# Patient Record
Sex: Female | Born: 1950 | Race: White | Hispanic: No | Marital: Single
Health system: Southern US, Community
[De-identification: ages and names within clinical notes are randomized; demographics above are authoritative.]

## PROBLEM LIST (undated history)

## (undated) ENCOUNTER — Telehealth

## (undated) ENCOUNTER — Encounter

## (undated) DIAGNOSIS — I509 Heart failure, unspecified: Secondary | ICD-10-CM

## (undated) DIAGNOSIS — I701 Atherosclerosis of renal artery: Secondary | ICD-10-CM

## (undated) DIAGNOSIS — IMO0001 Reserved for inherently not codable concepts without codable children: Secondary | ICD-10-CM

## (undated) DIAGNOSIS — F17200 Nicotine dependence, unspecified, uncomplicated: Secondary | ICD-10-CM

## (undated) DIAGNOSIS — F329 Major depressive disorder, single episode, unspecified: Secondary | ICD-10-CM

## (undated) DIAGNOSIS — R0600 Dyspnea, unspecified: Secondary | ICD-10-CM

## (undated) DIAGNOSIS — N289 Disorder of kidney and ureter, unspecified: Secondary | ICD-10-CM

## (undated) DIAGNOSIS — E7800 Pure hypercholesterolemia, unspecified: Secondary | ICD-10-CM

## (undated) DIAGNOSIS — M2550 Pain in unspecified joint: Secondary | ICD-10-CM

## (undated) DIAGNOSIS — I2510 Atherosclerotic heart disease of native coronary artery without angina pectoris: Secondary | ICD-10-CM

## (undated) DIAGNOSIS — F32A Depression, unspecified: Secondary | ICD-10-CM

## (undated) DIAGNOSIS — C3490 Malignant neoplasm of unspecified part of unspecified bronchus or lung: Secondary | ICD-10-CM

## (undated) DIAGNOSIS — I5042 Chronic combined systolic (congestive) and diastolic (congestive) heart failure: Secondary | ICD-10-CM

## (undated) DIAGNOSIS — J9601 Acute respiratory failure with hypoxia: Secondary | ICD-10-CM

## (undated) DIAGNOSIS — F1010 Alcohol abuse, uncomplicated: Secondary | ICD-10-CM

## (undated) DIAGNOSIS — I10 Essential (primary) hypertension: Secondary | ICD-10-CM

## (undated) DIAGNOSIS — N186 End stage renal disease: Secondary | ICD-10-CM

## (undated) DIAGNOSIS — J449 Chronic obstructive pulmonary disease, unspecified: Secondary | ICD-10-CM

## (undated) HISTORY — DX: Essential (primary) hypertension: I10

## (undated) HISTORY — DX: Alcohol abuse, uncomplicated: F10.10

## (undated) HISTORY — DX: Reserved for inherently not codable concepts without codable children: IMO0001

## (undated) HISTORY — DX: Atherosclerotic heart disease of native coronary artery without angina pectoris: I25.10

## (undated) HISTORY — DX: Major depressive disorder, single episode, unspecified: F32.9

## (undated) HISTORY — PX: TUBAL LIGATION: SHX77

## (undated) HISTORY — DX: Pure hypercholesterolemia, unspecified: E78.00

## (undated) HISTORY — DX: Pain in unspecified joint: M25.50

## (undated) HISTORY — PX: OTHER SURGICAL HISTORY: SHX169

## (undated) HISTORY — DX: Nicotine dependence, unspecified, uncomplicated: F17.200

## (undated) HISTORY — DX: Depression, unspecified: F32.A

## (undated) HISTORY — DX: Atherosclerosis of renal artery: I70.1

## (undated) HISTORY — PX: LUNG SURGERY: SHX703

## (undated) HISTORY — PX: CARDIAC CATHETERIZATION: SHX172

---

## 1999-06-11 ENCOUNTER — Emergency Department (HOSPITAL_COMMUNITY): Admission: EM | Admit: 1999-06-11 | Discharge: 1999-06-11 | Payer: Self-pay | Admitting: *Deleted

## 2000-11-16 DIAGNOSIS — I701 Atherosclerosis of renal artery: Secondary | ICD-10-CM

## 2000-11-16 HISTORY — DX: Atherosclerosis of renal artery: I70.1

## 2001-06-05 ENCOUNTER — Inpatient Hospital Stay (HOSPITAL_COMMUNITY): Admission: EM | Admit: 2001-06-05 | Discharge: 2001-06-07 | Payer: Self-pay | Admitting: *Deleted

## 2001-06-05 ENCOUNTER — Encounter: Payer: Self-pay | Admitting: *Deleted

## 2001-06-06 ENCOUNTER — Encounter: Payer: Self-pay | Admitting: Cardiology

## 2003-11-15 ENCOUNTER — Emergency Department (HOSPITAL_COMMUNITY): Admission: EM | Admit: 2003-11-15 | Discharge: 2003-11-15 | Payer: Self-pay | Admitting: Emergency Medicine

## 2005-10-07 ENCOUNTER — Ambulatory Visit: Payer: Self-pay | Admitting: Family Medicine

## 2005-10-15 ENCOUNTER — Ambulatory Visit: Payer: Self-pay | Admitting: *Deleted

## 2006-01-11 ENCOUNTER — Ambulatory Visit: Payer: Self-pay | Admitting: Family Medicine

## 2009-04-26 ENCOUNTER — Emergency Department (HOSPITAL_COMMUNITY): Admission: EM | Admit: 2009-04-26 | Discharge: 2009-04-26 | Payer: Self-pay | Admitting: Emergency Medicine

## 2010-04-16 ENCOUNTER — Encounter: Admission: RE | Admit: 2010-04-16 | Discharge: 2010-04-16 | Payer: Self-pay | Admitting: Internal Medicine

## 2010-06-24 ENCOUNTER — Encounter: Payer: Self-pay | Admitting: Cardiovascular Disease

## 2010-08-08 DIAGNOSIS — I2510 Atherosclerotic heart disease of native coronary artery without angina pectoris: Secondary | ICD-10-CM | POA: Insufficient documentation

## 2010-08-08 DIAGNOSIS — M2550 Pain in unspecified joint: Secondary | ICD-10-CM | POA: Insufficient documentation

## 2010-08-08 DIAGNOSIS — I10 Essential (primary) hypertension: Secondary | ICD-10-CM

## 2010-08-11 DIAGNOSIS — F329 Major depressive disorder, single episode, unspecified: Secondary | ICD-10-CM

## 2010-08-11 DIAGNOSIS — I739 Peripheral vascular disease, unspecified: Secondary | ICD-10-CM | POA: Insufficient documentation

## 2010-08-12 ENCOUNTER — Ambulatory Visit: Payer: Self-pay | Admitting: Cardiovascular Disease

## 2010-08-12 DIAGNOSIS — R0989 Other specified symptoms and signs involving the circulatory and respiratory systems: Secondary | ICD-10-CM

## 2010-09-02 ENCOUNTER — Telehealth (INDEPENDENT_AMBULATORY_CARE_PROVIDER_SITE_OTHER): Payer: Self-pay | Admitting: *Deleted

## 2010-09-03 ENCOUNTER — Ambulatory Visit: Payer: Self-pay

## 2010-09-03 ENCOUNTER — Ambulatory Visit: Payer: Self-pay | Admitting: Cardiology

## 2010-09-03 ENCOUNTER — Encounter: Payer: Self-pay | Admitting: Cardiovascular Disease

## 2010-09-03 ENCOUNTER — Encounter (HOSPITAL_COMMUNITY)
Admission: RE | Admit: 2010-09-03 | Discharge: 2010-11-15 | Payer: Self-pay | Source: Home / Self Care | Attending: Cardiovascular Disease | Admitting: Cardiovascular Disease

## 2010-09-03 ENCOUNTER — Encounter: Payer: Self-pay | Admitting: Cardiology

## 2010-09-10 ENCOUNTER — Telehealth (INDEPENDENT_AMBULATORY_CARE_PROVIDER_SITE_OTHER): Payer: Self-pay | Admitting: *Deleted

## 2010-09-10 DIAGNOSIS — I701 Atherosclerosis of renal artery: Secondary | ICD-10-CM | POA: Insufficient documentation

## 2010-10-06 ENCOUNTER — Encounter: Admission: RE | Admit: 2010-10-06 | Discharge: 2010-10-06 | Payer: Self-pay | Admitting: Internal Medicine

## 2010-10-22 ENCOUNTER — Encounter: Payer: Self-pay | Admitting: Cardiovascular Disease

## 2010-10-22 ENCOUNTER — Ambulatory Visit: Payer: Self-pay

## 2010-11-12 ENCOUNTER — Ambulatory Visit: Payer: Self-pay | Admitting: Cardiovascular Disease

## 2010-12-09 ENCOUNTER — Ambulatory Visit: Payer: Self-pay | Admitting: Internal Medicine

## 2010-12-09 ENCOUNTER — Other Ambulatory Visit: Payer: Self-pay | Admitting: Cardiovascular Disease

## 2010-12-09 ENCOUNTER — Ambulatory Visit
Admission: RE | Admit: 2010-12-09 | Discharge: 2010-12-09 | Payer: Self-pay | Source: Home / Self Care | Attending: Cardiovascular Disease | Admitting: Cardiovascular Disease

## 2010-12-09 LAB — BASIC METABOLIC PANEL
BUN: 11 mg/dL (ref 6–23)
CO2: 25 mEq/L (ref 19–32)
Calcium: 9.5 mg/dL (ref 8.4–10.5)
Chloride: 108 mEq/L (ref 96–112)
Creatinine, Ser: 1.1 mg/dL (ref 0.4–1.2)
GFR: 52.17 mL/min — ABNORMAL LOW (ref >60.00)
Glucose, Bld: 101 mg/dL — ABNORMAL HIGH (ref 70–99)
Potassium: 4.1 mEq/L (ref 3.5–5.1)
Sodium: 140 mEq/L (ref 135–145)

## 2010-12-11 ENCOUNTER — Telehealth: Payer: Self-pay | Admitting: Cardiovascular Disease

## 2010-12-16 NOTE — Progress Notes (Addendum)
  Phone Note Outgoing Call   Call placed by: Devra Dopp, LPN,  October 26, 624THL 8:46 AM Call placed to: Patient Summary of Call: PT AWARE OF TEST RESULTS AND THE NEED TO HAVE RENAL U/S . Initial call taken by: Devra Dopp, LPN,  October 26, 624THL 8:49 AM  New Problems: RENAL ARTERY STENOSIS (ICD-440.1)   New Problems: RENAL ARTERY STENOSIS (ICD-440.1)

## 2010-12-16 NOTE — Assessment & Plan Note (Addendum)
Summary: np3/ increase sob, stent in 05/ pt has medicade. gd   CC:  sob.  History of Present Illness: Kari Robinson is seen today at the request of Dr Toy Cookey.  She is a poorly followed vasculopath who had been living in [place].  She has some old records from Dr Glade Lloyd that suggested RAS and moderate CAD.  She apparantly had a coronary stent placed a year ago in [place].  She has been on Chantix but is still smoking.  I ocunseld her for less than 10 minutes on cessation and link of smoking to vascular disease.  She has had some exertional dyspnea and SSCP.  Pain is sharp and more related to dyspnea.  No rest pain or associated diaphoresis radiation or syncope.  Decscribes ? iliac clasudicaiton on right with walking.  Compliant with meds.  Discussed excess weight and link to recent diagnosis of Type 2 diabetes.    Current Problems (verified): 1)  Abdominal Bruit  (ICD-785.9) 2)  Peripheral Vascular Disease  (ICD-443.9) 3)  Cad  (ICD-414.00) 4)  Hypertension  (ICD-401.9) 5)  Depression  (ICD-311) 6)  Arthralgia  (ICD-719.40)  Current Medications (verified): 1)  Cymbalta 60 Mg Cpep (Duloxetine Hcl) .[name].. 1 Tab By Mouth Once Daily 2)  Metformin Hcl 500 Mg Tabs (Metformin Hcl) .[name].. 1 Tab By Mouth Two Times A Day 3)  Nitrostat 0.4 Mg Subl (Nitroglycerin) .[name].. 1 Tablet Under Tongue At Onset of Chest Pain; You May Repeat Every 5 Minutes For Up To 3 Doses. 4)  Metoprolol Succinate 50 Mg Xr24h-Tab (Metoprolol Succinate) .... Take One Tablet By Mouth Daily 5)  Trilipix 135 Mg Cpdr (Choline Fenofibrate) .[name].. 1 Tab By Mouth At Bedtime 6)  Gabapentin 300 Mg Caps (Gabapentin) .[name].. 1 Tab By Mouth Three Times A Day 7)  Zolpidem Tartrate 10 Mg Tabs (Zolpidem Tartrate) .[name].. 1  Tab By Mouth Once Daily 8)  Aspirin 81 Mg Tbec (Aspirin) .... Take One Tablet By Mouth Daily 9)  Vitamin D3 1000 Unit Caps (Cholecalciferol) .... 2 Tabs By Mouth Once Daily 10)  Lisinopril-Hydrochlorothiazide 20-12.5 Mg Tabs  (Lisinopril-Hydrochlorothiazide) .[name].. 1 Tab By Mouth Once Daily 11)  Simvastatin 40 Mg Tabs (Simvastatin) .... Take One Tablet By Mouth Daily At Bedtime 12)  Isosorbide Dinitrate 20 Mg Tabs (Isosorbide Dinitrate) .[name].. 1 Tab By Mouth Two Times A Day 13)  Plavix 75 Mg Tabs (Clopidogrel Bisulfate) .... Take One Tablet By Mouth Daily  Allergies (verified): No Known Drug Allergies  Past History:  Past Medical History: Last updated: 08/08/2010 RAS: By cath Tysinger in 2002 ? F/U CAD: moderate primarily septal perforater.  Medical Rx advised Hypercholesterolemia:  primarily LDL-P and small particles Alcohol Abuse Smoking HYPERTENSION DEPRESSION ARTHRALGIA       Past Surgical History: Last updated: 08/08/2010 Cardiac Catheterization Left heart catheterization. Coronary cineangiography. Left ventricular cineangiography. Abdominal aortogram. Perclose of the right femoral artery. After signing an informed consent, the patient was transported from her bed at [facility] to the [facility] Cardiac Catheterization Lab.  Her right groin was prepped and draped in a sterile fashion and anesthetized locally with 1% lidocaine.  A #6 French introducer sheath was inserted percutaneously into the right femoral artery.The 6 [place] #4 Judkins coronary catheters were used to make injections into the coronary arteries.  A 6 French pigtail catheter was used to measure pressures in the left ventricle and aorta and to make mid stream injections into the left ventricle and abdominal aorta.  The patient tolerated the procedure well  and no complications were noted.  At the end of the procedure, the catheter and sheath were removed from the right femoral artery and hemostasis was easily obtained with a Perclose closure system. Proc. Date: 06/07/01    Tubal ligation.  Family History: Last updated: 08/08/2010  The patient is adopted, and is unaware of her maternal family history.  She does know  that her father has emphysema, and her paternal grandmother died with emphysema.  Social History: Last updated: 08/08/2010  She is married and has five children.  Two of these children are adopted.  She drinks at least 3 beers daily, and one case of beer over a weekend.  Has smoked 2 packs per day for 35 years.  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  Review of Systems       Denies fever, malais, weight loss, blurry vision, decreased visual acuity, cough, sputum, SOB, hemoptysis, pleuritic pain, palpitaitons, heartburn, abdominal pain, melena, lower extremity edema, claudication, or rash.   Vital Signs:  Patient profile:   60 year old female Height:      63 inches Weight:      192 pounds BMI:     34.13 Pulse rate:   84 / minute Resp:     14 per minute BP sitting:   125 / 80  (left arm)  Vitals Entered By: Burnett Kanaris (August 12, 2010 11:49 AM)  Physical Exam  General:  Affect appropriate Healthy:  appears stated age HEENT: normal Neck supple with no adenopathy JVP normal bilateral bruits no thyromegaly Lungs clear with no wheezing and good diaphragmatic motion Heart:  99991111 systolic  murmur,rub, gallop or click PMI normal Abdomen: benighn, BS positve, no tenderness, no AAA no bruit.  No HSM or HJR Distal pulses intact with no bruits No edema Neuro non-focal Skin warm and dry    Impression & Recommendations:  Problem # 1:  ABDOMINAL BRUIT (ICD-785.9) Duplex to R.O AAA and asess prev DX of RAS  BP well controlled Orders: Carotid Duplex (Carotid Duplex) Abdominal Aorta Duplex (Abd Aorta Duplex)  Problem # 2:  PERIPHERAL VASCULAR DISEASE (ICD-443.9) ? significant right inflow disease.  ABI's and duplex  Problem # 3:  HYPERTENSION (ICD-401.9) Well controlled continue current meds Her updated medication list for this problem includes:    Metoprolol Succinate 50 Mg Xr24h-tab (Metoprolol succinate)  .[name]... Take one tablet by mouth daily    Aspirin 81 Mg Tbec (Aspirin) .[name]... Take one tablet by mouth daily    Lisinopril-hydrochlorothiazide 20-12.5 Mg Tabs (Lisinopril-hydrochlorothiazide) .[name]... 1 tab by mouth once daily  Problem # 4:  CAD (ICD-414.00) No critical dsiease by cath 2002 outside septal.  Atypical symptoms but obvious vasculopath. Lexiscan myovue Her updated medication list for this problem includes:    Nitrostat 0.4 Mg Subl (Nitroglycerin) .[name]... 1 tablet under tongue at onset of chest pain; you may repeat every 5 minutes for up to 3 doses.    Metoprolol Succinate 50 Mg Xr24h-tab (Metoprolol succinate) .[name]... Take one tablet by mouth daily    Aspirin 81 Mg Tbec (Aspirin) .[name]... Take one tablet by mouth daily    Lisinopril-hydrochlorothiazide 20-12.5 Mg Tabs (Lisinopril-hydrochlorothiazide) .[name]... 1 tab by mouth once daily    Isosorbide Dinitrate 20 Mg Tabs (Isosorbide dinitrate) .[name]... 1 tab by mouth two times a day    Plavix 75 Mg Tabs (Clopidogrel bisulfate) .[name]... Take one tablet by mouth daily  Orders: Nuclear Stress Test (Nuc Stress  Test)  Other Orders: Arterial Duplex Lower Extremity (Arterial Duplex Low)  Patient Instructions: 1)  Your physician recommends that you schedule a follow-up appointment in: AFTER TESTING 2)  Your physician has requested that you have an abdominal aorta duplex. During this test, an ultrasound is used to evaluate the aorta. Allow 30 minutes for this exam. Do not eat after midnight the day before and avoid carbonated beverages. There are no restrictions or special instructions. 3)  Your physician has requested that you have an ankle brachial index (ABI). During this test an ultrasound and blood pressure cuff are used to evaluate the arteries that supply the arms and legs with blood. Allow thirty minutes for this exam. There are no restrictions or special instructions. 4)  Your physician has requested that you have a carotid duplex. This test is an  ultrasound of the carotid arteries in your neck. It looks at blood flow through these arteries that supply the brain with blood. Allow one hour for this exam. There are no restrictions or special instructions. 5)  Your physician has requested that you have an exercise stress myoview.  For further information please visit [email or web address].  Please follow instruction sheet, as given.   EKG Report  Procedure date:  08/12/2010  Findings:      NSR 77 Normal ECG

## 2010-12-16 NOTE — Progress Notes (Addendum)
Summary: Nuclear pre procedure  Phone Note Outgoing Call Call back at [facility] Phone [phone]   Call placed by: Valetta Fuller, [place],  September 02, 2010 4:52 PM Call placed to: Patient Summary of Call: Left message with information on Myoview Information Sheet (see scanned document for details).      Nuclear Med Background Indications for Stress Test: Evaluation for Ischemia   History: Heart Catheterization, Stents   Symptoms: Chest Pain with Exertion, DOE    Nuclear Pre-Procedure Cardiac Risk Factors: Claudication, Hypertension, Lipids, NIDDM, PVD, Smoker Height (in): 63

## 2010-12-16 NOTE — Assessment & Plan Note (Addendum)
Summary: Cardiology Nuclear Testing  Nuclear Med Background Indications for Stress Test: Evaluation for Ischemia   History: Heart Catheterization, Stents   Symptoms: Chest Pain with Exertion, DOE, Palpitations, SOB    Nuclear Pre-Procedure Cardiac Risk Factors: Claudication, Hypertension, Lipids, NIDDM, PVD, Smoker Caffeine/Decaff Intake: None NPO After: 9:00 PM Lungs: clear IV 0.9% NS with Angio Cath: 22g     IV Site: R Antecubital IV Started by: Irven [name], RN Chest Size (in) 40     Cup Size C     Height (in): 63 Weight (lb): 190 BMI: 33.78 Tech Comments: The patient took metformin,and metoprolol this am.  Nuclear Med Study 1 or 2 day study:  1 day     Stress Test Type:  Carlton Adam Reading MD:  Dola Argyle, MD     Referring MD:  P.Nishan Resting Radionuclide:  Technetium 86m Tetrofosmin     Resting Radionuclide Dose:  11 mCi  Stress Radionuclide:  Technetium 45m Tetrofosmin     Stress Radionuclide Dose:  33 mCi   Stress Protocol   Lexiscan: 0.4 mg   Stress Test Technologist:  Perrin Maltese, EMT-P     Nuclear Technologist:  Annye Rusk, CNMT  Rest Procedure  Myocardial perfusion imaging was performed at rest 45 minutes following the intravenous administration of Technetium 43m Tetrofosmin.  Stress Procedure  The patient received IV Lexiscan 0.4 mg over 15-seconds.  Technetium 80m Tetrofosmin injected at 30-seconds.  There were no significant changes with infusion.  Quantitative spect images were obtained after a 45 minute delay.  QPS Raw Data Images:  Patient motion noted; appropriate software correction applied. Stress Images:  Normal homogeneous uptake in all areas of the myocardium. Rest Images:  Normal homogeneous uptake in all areas of the myocardium. Subtraction (SDS):  No evidence of ischemia. Transient Ischemic Dilatation:  1.15  (Normal <1.22)  Lung/Heart Ratio:  0.31  (Normal <0.45)  Quantitative Gated Spect Images QGS EDV:  94 ml QGS ESV:  39  ml QGS EF:  58 % QGS cine images:  Normal motion  Findings Normal nuclear study      Overall Impression  Exercise Capacity: Lexiscan with no exercise. BP Response: Normal blood pressure response. Clinical Symptoms: SOB ECG Impression: No significant ST segment change suggestive of ischemia. Overall Impression: Normal stress nuclear study.  Appended Document: Cardiology Nuclear Testing normal nuclear study  Appended Document: Cardiology Nuclear Testing pt aware of results

## 2010-12-17 ENCOUNTER — Encounter: Payer: Self-pay | Admitting: Cardiovascular Disease

## 2010-12-18 NOTE — Progress Notes (Addendum)
Summary: wants ct results  Phone Note Call from Patient   Caller: Patient [phone] Reason for Call: Talk to Nurse, Lab or Test Results Summary of Call: pt calling re ct results Initial call taken by: Lorenda Hatchet,  December 11, 2010 9:38 AM  Follow-up for Phone Call        pt calling back requesting a call back Lorenda Hatchet  December 11, 2010 3:42 PM  Additional Follow-up for Phone Call Additional follow up Details #1::        Spoke with patient. She will followup with [place] after her nephrology appt next week.  Additional Follow-up by: Neale Burly, MD,  December 11, 2010 5:29 PM

## 2010-12-18 NOTE — Assessment & Plan Note (Addendum)
Summary: NPV/F/U CTA OF RENALS today at 2:30/DM   Visit Type:  Initial Consult  CC:  New PV evaluation per Kari Robinson.  History of Present Illness: 60 year-old woman referred for initial evaluation of renal artery stenosis. She recently underwent renal duplex scanning showing a peak systolic velocity of 0000000 cm/s at the ostium of the right renal artery and 258 cm/s of the left renal. Doppler 'beading' was noted, raising suspicion for FMD.  Pt was first diagnosed with HTN over 20 years ago and she has been on antihypertensive Rx for many years. She reports that BP typically runs in a good range, but she is very nervous about her visit here today.   Denies chest pain or dyspnea at present. Denies leg swelling.     Current Medications (verified): 1)  Cymbalta 60 Mg Cpep (Duloxetine Hcl) .[name].. 1 Tab By Mouth Once Daily 2)  Metformin Hcl 500 Mg Tabs (Metformin Hcl) .[name].. 1 Tab By Mouth Two Times A Day 3)  Nitrostat 0.4 Mg Subl (Nitroglycerin) .[name].. 1 Tablet Under Tongue At Onset of Chest Pain; You May Repeat Every 5 Minutes For Up To 3 Doses. 4)  Metoprolol Succinate 50 Mg Xr24h-Tab (Metoprolol Succinate) .... Take One Tablet By Mouth Daily 5)  Trilipix 135 Mg Cpdr (Choline Fenofibrate) .[name].. 1 Tab By Mouth At Bedtime 6)  Gabapentin 300 Mg Caps (Gabapentin) .[name].. 1 Tab By Mouth Three Times A Day 7)  Zolpidem Tartrate 10 Mg Tabs (Zolpidem Tartrate) .[name].. 1  Tab By Mouth Once Daily 8)  Aspirin 81 Mg Tbec (Aspirin) .... Take One Tablet By Mouth Daily 9)  Vitamin D3 1000 Unit Caps (Cholecalciferol) .... 2 Tabs By Mouth Once Daily 10)  Lisinopril-Hydrochlorothiazide 20-12.5 Mg Tabs (Lisinopril-Hydrochlorothiazide) .[name].. 1 Tab By Mouth Once Daily 11)  Simvastatin 40 Mg Tabs (Simvastatin) .... Take One Tablet By Mouth Daily At Bedtime 12)  Isosorbide Dinitrate 20 Mg Tabs (Isosorbide Dinitrate) .[name].. 1 Tab By Mouth Two Times A Day 13)  Plavix 75 Mg Tabs (Clopidogrel Bisulfate) .... Take One Tablet By Mouth  Daily  Allergies: 1)  ! Celexa  Past History:  Past medical, surgical, family and social histories (including risk factors) reviewed, and no changes noted (except as noted below).  Past Medical History: Reviewed history from 08/08/2010 and no changes required. RAS: By cath Kari Robinson in 2002 ? F/U CAD: moderate primarily septal perforater.  Medical Rx advised Hypercholesterolemia:  primarily LDL-P and small particles Alcohol Abuse Smoking HYPERTENSION DEPRESSION ARTHRALGIA       Past Surgical History: Reviewed history from 08/08/2010 and no changes required. Cardiac Catheterization Left heart catheterization. Coronary cineangiography. Left ventricular cineangiography. Abdominal aortogram. Perclose of the right femoral artery. After signing an informed consent, the patient was transported from her bed at [facility] to the [facility] Cardiac Catheterization Lab.  Her right groin was prepped and draped in a sterile fashion and anesthetized locally with 1% lidocaine.  A #6 French introducer sheath was inserted percutaneously into the right femoral artery.The 6 [place] #4 Judkins coronary catheters were used to make injections into the coronary arteries.  A 6 French pigtail catheter was used to measure pressures in the left ventricle and aorta and to make mid stream injections into the left ventricle and abdominal aorta.  The patient tolerated the procedure well and no complications were noted.  At the end of the procedure, the catheter and sheath were removed from the right femoral artery and hemostasis was easily obtained with a Perclose  closure system. Proc. Date: 06/07/01    Tubal ligation.  Family History: Reviewed history from 08/08/2010 and no changes required.  The patient is adopted, and is unaware of her maternal family history.  She does know that her father has emphysema, and her paternal grandmother died with emphysema.  Social History: Reviewed history  from 08/08/2010 and no changes required.  She is married and has five children.  Two of these children are adopted.  She drinks at least 3 beers daily, and one case of beer over a weekend.  Has smoked 2 packs per day for 35 years.  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  Vital Signs:  Patient profile:   60 year old female Height:      63 inches Weight:      193.25 pounds BMI:     34.36 Pulse rate:   76 / minute Pulse rhythm:   regular Resp:     18 per minute BP sitting:   156 / 98  (left arm) Cuff size:   large  Vitals Entered By: Kari Robinson (December 09, 2010 2:31 PM)  Serial Vital Signs/Assessments:  Time      Position  BP       Pulse  Resp  Temp     By           R Arm     158/96                         Kari Robinson   Physical Exam  General:  Pt is alert and oriented, in no acute distress. HEENT: normal Neck: normal carotid upstrokes without bruits, JVP normal Lungs: CTA CV: RRR without murmur or gallop Abd: soft, NT, positive BS, no bruit, no organomegaly Ext: no clubbing, cyanosis, or edema. peripheral pulses 2+ and equal Skin: warm and dry without rash    CT Scan  Procedure date:  12/09/2010  Findings:      The origin of both renal arteries are heavily calcified and diseased.  There is a critical stenosis at the origin of the right renal artery.  There may be post stenotic dilatation.  The origin of the left renal artery is also heavily calcified.  The left renal artery stenosis is less severe than the right side but appears to be at least 50%.  The renal arteries distal to the stenosis measure close to 8 mm.  No evidence for accessory renal arteries.   Impression & Recommendations:  Problem # 1:  RENAL ARTERY STENOSIS (ICD-53.83) 60 year-old woman with bilateral renal artery stenosis. The right renal artery appears to have critical stenosis by CTA and her doppler study supports severe disease.  Fortunately, her bilateral kidney size is normal. The left renal artery also is stenotic but this may be in the moderate range by CTA and duplex (at least 50% by CTA). She has preserved renal function with a creatinine of 1.1 mg/dL. I did not have the CTA result when I saw the patient today, but I would favor renal angiography with an eye toward PTA of the critical right renal artery stenosis. The left renal artery could also be assessed with catheter angiography at the time of the procedure. Will review these findings with the patient and discuss the procedural indications and risks with her in detail. She has an upcoming appt with [facility] and I will check to see who  will be evaluating her and discuss the situation with them.  Appended Document: NPV/F/U CTA OF RENALS today at 2:30/DM Pt has seen Dr Moshe Cipro with [facility]. She agreed reanl stenting was appropriate in the setting of critical renal stenosis by CTA. She recommended admission the day prior to the procedure for prehydration. The patient's HCTZ and Robinson-inhibitor have been held since 2/20. She is on daily ASA and plavix. Will admit this afternoon and plan on renal angio with probable PTA tomorrow.

## 2010-12-19 ENCOUNTER — Telehealth (INDEPENDENT_AMBULATORY_CARE_PROVIDER_SITE_OTHER): Payer: Self-pay | Admitting: *Deleted

## 2010-12-19 ENCOUNTER — Encounter: Payer: Self-pay | Admitting: Cardiovascular Disease

## 2010-12-24 NOTE — Progress Notes (Addendum)
Summary: Records Request  Faxed OV, EKG & Doppler to Alinda Sierras at [facility] ([id number])  Kari Robinson  December 19, 2010 12:49 PM

## 2011-01-01 ENCOUNTER — Telehealth: Payer: Self-pay | Admitting: Cardiovascular Disease

## 2011-01-06 ENCOUNTER — Telehealth: Payer: Self-pay | Admitting: Cardiovascular Disease

## 2011-01-06 ENCOUNTER — Inpatient Hospital Stay (HOSPITAL_COMMUNITY): Admission: AD | Admit: 2011-01-06 | Payer: Self-pay | Source: Ambulatory Visit | Admitting: Cardiovascular Disease

## 2011-01-07 NOTE — Progress Notes (Addendum)
Summary: Need Renal Angiogram  Phone Note Outgoing Call Call back at [facility] Phone [phone]   Call placed by: Theodosia Quay, RN, BSN,  December 31, 2010  Call placed to: Patient Summary of Call:  We received letter from Dr Moshe Cipro about scheduling Renal Angiogram.  Dr Burt Knack reviewed and the pt would need to be admitted the day prior to procedure for prehydration and hold Lisinopril HCT 24 hours prior to procedure.  Left message for pt to call back to discuss procedure.  We can either schedule procedure if pt is ready or if the pt needs to speak with Dr Burt Knack about procedure an appt can be arranged.  Initial call taken by: Theodosia Quay, RN, BSN,  December 31, 2010   Follow-up for Phone Call        pt rtn call to Lauren about scheduling procedure please call her at her home number Shelda Pal  January 02, 2011 12:15 PM  Spoke with pt. She would like to have procedure done as soon as possible and does not need to see Dr. Burt Knack prior to procedure. Pt available Feb. 22 if can be done that day. Thompson Grayer, RN, BSN  January 02, 2011 1:22 PM   Additional Follow-up for Phone Call Additional follow up Details #1::        Discussed with Dr. Burt Knack and procedure arranged for 10:30 on Feb. 22. Per Dr. Burt Knack pt needs to be admitted to a non telemetry bed on Feb 21 for IV fluids to be started around 4 PM.  Pt to take last dose of Lisinopril /HCT on Monday. Admitting called and will call pt on Tuesday afternoon with bed assignment at [facility].  Pt. has previous coronary stent and has no questions regarding procedure.  Pt given all above information regarding medication and admission plans. Pt also instructed last dose of metformin should be AM of February 22 and that it will be held for 48 hours after procedure.  Pt verbalized understanding of all instructions. Additional Follow-up by: Thompson Grayer, RN, BSN,  January 02, 2011 1:48 PM

## 2011-01-12 ENCOUNTER — Inpatient Hospital Stay (HOSPITAL_COMMUNITY)
Admission: AD | Admit: 2011-01-12 | Discharge: 2011-01-14 | DRG: 674 | Disposition: A | Discharge status: Home / Self Care | Payer: Medicaid Other | Source: Ambulatory Visit | Attending: Cardiovascular Disease | Admitting: Cardiovascular Disease

## 2011-01-12 DIAGNOSIS — E119 Type 2 diabetes mellitus without complications: Secondary | ICD-10-CM | POA: Diagnosis present

## 2011-01-12 DIAGNOSIS — F329 Major depressive disorder, single episode, unspecified: Secondary | ICD-10-CM | POA: Diagnosis present

## 2011-01-12 DIAGNOSIS — I701 Atherosclerosis of renal artery: Principal | ICD-10-CM | POA: Diagnosis present

## 2011-01-12 DIAGNOSIS — I7789 Other specified disorders of arteries and arterioles: Secondary | ICD-10-CM | POA: Diagnosis present

## 2011-01-12 DIAGNOSIS — I7409 Other arterial embolism and thrombosis of abdominal aorta: Secondary | ICD-10-CM | POA: Diagnosis present

## 2011-01-12 DIAGNOSIS — F1010 Alcohol abuse, uncomplicated: Secondary | ICD-10-CM | POA: Diagnosis present

## 2011-01-12 DIAGNOSIS — I10 Essential (primary) hypertension: Secondary | ICD-10-CM | POA: Diagnosis present

## 2011-01-12 DIAGNOSIS — E785 Hyperlipidemia, unspecified: Secondary | ICD-10-CM | POA: Diagnosis present

## 2011-01-12 DIAGNOSIS — F17200 Nicotine dependence, unspecified, uncomplicated: Secondary | ICD-10-CM | POA: Diagnosis present

## 2011-01-12 DIAGNOSIS — D3500 Benign neoplasm of unspecified adrenal gland: Secondary | ICD-10-CM | POA: Diagnosis present

## 2011-01-12 DIAGNOSIS — Z7982 Long term (current) use of aspirin: Secondary | ICD-10-CM

## 2011-01-12 DIAGNOSIS — F3289 Other specified depressive episodes: Secondary | ICD-10-CM | POA: Diagnosis present

## 2011-01-12 DIAGNOSIS — I2510 Atherosclerotic heart disease of native coronary artery without angina pectoris: Secondary | ICD-10-CM | POA: Diagnosis present

## 2011-01-12 DIAGNOSIS — I7411 Embolism and thrombosis of thoracic aorta: Secondary | ICD-10-CM | POA: Diagnosis present

## 2011-01-12 DIAGNOSIS — Z7902 Long term (current) use of antithrombotics/antiplatelets: Secondary | ICD-10-CM

## 2011-01-12 LAB — BASIC METABOLIC PANEL
CO2: 25 mEq/L (ref 19–32)
Calcium: 9.5 mg/dL (ref 8.4–10.5)
GFR calc Af Amer: >60 mL/min (ref >60)
GFR calc non Af Amer: 53 mL/min — ABNORMAL LOW (ref >60)
Sodium: 138 mEq/L (ref 135–145)

## 2011-01-12 LAB — CBC
HCT: 38.9 % (ref 36.0–46.0)
Hemoglobin: 12.6 g/dL (ref 12.0–15.0)
MCH: 31.9 pg (ref 26.0–34.0)
MCV: 98.5 fL (ref 78.0–100.0)
RBC: 3.95 MIL/uL (ref 3.87–5.11)

## 2011-01-12 LAB — PROTIME-INR: Prothrombin Time: 13.4 seconds (ref 11.6–15.2)

## 2011-01-12 LAB — GLUCOSE, CAPILLARY: Glucose-Capillary: 92 mg/dL (ref 70–99)

## 2011-01-13 ENCOUNTER — Encounter: Payer: Self-pay | Admitting: Cardiovascular Disease

## 2011-01-13 DIAGNOSIS — I701 Atherosclerosis of renal artery: Secondary | ICD-10-CM

## 2011-01-13 LAB — POCT ACTIVATED CLOTTING TIME
Activated Clotting Time: 181 seconds
Activated Clotting Time: 164 seconds

## 2011-01-13 NOTE — Progress Notes (Addendum)
Summary: cancel procedure  Phone Note Other Incoming   Summary of Call: Pt. has no ride to hospital today and needs to cancel procedure.  Will cancel and notify hospital, [facility] lab and Dr. Burt Knack.  I told pt I would have Lauren call her to reschedule when she is back in office.  Pt to resume all regular medications. Initial call taken by: Thompson Grayer, RN, BSN,  January 06, 2011 2:21 PM     Appended Document: cancel procedure I spoke with the pt and she is scheduled for admission on 01/12/11 for hydration and then Renal Angiogram on 01/13/11.  The pt will take her last dose of Lisinopril HCT on 01/11/11.

## 2011-01-14 LAB — BASIC METABOLIC PANEL
BUN: 10 mg/dL (ref 6–23)
CO2: 27 mEq/L (ref 19–32)
Chloride: 111 mEq/L (ref 96–112)
Creatinine, Ser: 1.1 mg/dL (ref 0.4–1.2)
Glucose, Bld: 109 mg/dL — ABNORMAL HIGH (ref 70–99)

## 2011-01-14 LAB — CBC
MCH: 31.3 pg (ref 26.0–34.0)
MCV: 99.8 fL (ref 78.0–100.0)
Platelets: 254 10*3/uL (ref 150–400)
RDW: 14.3 % (ref 11.5–15.5)

## 2011-01-19 NOTE — Discharge Summary (Addendum)
[name], Kari Robinson                 ACCOUNT NO.:  [id number]  MEDICAL RECORD NO.:  [id number]           PATIENT TYPE:  I  LOCATION:  [id number]                         FACILITY:  [facility]  PHYSICIAN:  Juanda Bond. Burt Knack, MD  DATE OF BIRTH:  1951/06/16  DATE OF ADMISSION:  01/12/2011 DATE OF DISCHARGE:  01/14/2011                              DISCHARGE SUMMARY   Please note that the patient has two medical record numbers, this one which is [id number] as well as [id number], which contains her preprocedural history and physical.  DISCHARGE DIAGNOSES: 1. Renal artery stenosis assessed by peripheral vascular angiography     on January 13, 2011.     a.     Severe ostial right renal artery stenosis treated supposedly      with renal artery PTA and stenting.     b.     Moderate left renal artery stenosis without significant      pressure gradient.     c.     Bilateral renal fibromuscular dysplasia.     d.     Will need followup duplex scan in 6 months. 2. History of coronary artery disease by cath in 2002 with moderate     mid circumflex disease and severe first septal perforator disease,     medical therapy with normal ejection fraction at that time.     a.     Pt endorses history of stenting in [place].     b.     Normal nuclear stress test on October 2011. 3. Hyperlipidemia. 4. Alcohol/tobacco abuse. 5. Depression. 6. Hypertension. 7. Status post tubal ligation. 8. Indeterminate 1.5 cm low-density structure, left adrenal gland,     felt to be an adenoma based on the size per CT angio on December 11, 2010. 9. 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 10.Diabetes mellitus.  HOSPITAL COURSE:  Kari Robinson is a 60 year old female with a history of coronary artery disease, hypertension, and tobacco abuse who was referred to Dr. Burt Knack for initial evaluation of renal artery  stenosis. She underwent renal duplex scanning showing a peak systolic velocity of 0000000 cm/sec in the ostium of the right renal artery and 258 cm/sec of the left renal.  Doppler beating was noted raising suspicion for problem muscular dysplasia.  The patient was diagnosed with hypertension 20 years ago and has been on antihypertensive therapy for many years.  Dr. Burt Knack recommended renal angiography, which was clear with [facility].  Her lisinopril and hydrochlorothiazide were held on January 05, 2011.  She came into the hospital for the angiography on January 05, 2011, which demonstrated the above findings.  She had successful renal artery PTA and stenting of the right renal artery.  Dr. Burt Knack recommended the patient continue on dual antiplatelet therapy with aspirin and Plavix.  He does note that she has a history of coronary stenting; however, upon clarification it appears that the patient only has a history of coronary  artery disease treated medically. I have also discussed the findings on him regarding the irregular mural thrombus seen and CT angio and he feels that this may only be monitored for now, will not require any additional anticoagulation, and that is likely related to her atherosclerotic disease.  He has seen and examined her today and feels that she is stable for discharge with the below medications as listed.  DISCHARGE LABS:  WBC 7.6, hemoglobin 12.7, hematocrit 40.5, platelet count 254.  Sodium 142, potassium 4.7, chloride 111, CO2 of 27, glucose 109, BUN 10, creatinine 1.10.  STUDIES:  Peripheral vascular angiography on January 05, 2011.  Please see full report for details as well as HPI for summary.  DISCHARGE MEDICATIONS: 1. Aspirin 81 mg daily. 2. Crestor 40 mg at bedtime. 3. Cymbalta 60 mg daily. 4. Gabapentin 300 mg t.i.d. 5. Isordil 20 mg b.i.d. 6. Lisinopril/hydrochlorothiazide 20/12.5 mg daily with instruction to     restart on January 15, 2011. 7. Metformin 500 mg b.i.d. with instructions not to start until January 16, 2011. 8. Metoprolol succinate 50 mg every morning. 9. Nystatin 1000 mg at bedtime. 10.Plavix 75 mg every morning. 11.Spiriva 18 mcg inhaled daily. 12.Trilipix 135 mg daily at bedtime. 13.Vitamin D 1000 units 2 tablets daily. 14.Zolpidem 10 mg at bedtime p.r.n. sleep.  DISPOSITION:  Kari Robinson will be discharged in stable condition to home. She is not to lift anything over 5 pounds for one week, participate in sexual activity for 1 week, or drive for 2 days.  She is to follow a low- sodium, heart-healthy diabetic diet and to call or return if she notices any pain, swelling, bleeding, or pus at her procedure site.  Dr. Antionette Char office is working on the referral to a [employer] cardiologist.  She is also instructed to follow up with her primary care provider as scheduled.  DURATION OF DISCHARGE ENCOUNTER:  Greater than 30 minutes including physician and PA time.     Melina Copa, P.A.C.   ______________________________ Juanda Bond. Burt Knack, MD    DD/MEDQ  D:  01/14/2011  T:  01/14/2011  Job:  [id number]  cc:   [facility]  Electronically Signed by Melina Copa  on 01/19/2011 05:12:59 PM Electronically Signed by Sherren Mocha MD on 01/20/2011 09:10:40 PM

## 2011-01-22 NOTE — Procedures (Signed)
Kari Robinson                 ACCOUNT NO.:  [id number]  MEDICAL RECORD NO.:  [id number]           PATIENT TYPE:  I  LOCATION:  6703                         FACILITY:  [facility]  PHYSICIAN:  Juanda Bond. Burt Knack, MD  DATE OF BIRTH:  [birth year]  DATE OF PROCEDURE:  01/13/2011 DATE OF DISCHARGE:                   PERIPHERAL VASCULAR INVASIVE PROCEDURE   PROCEDURES: 1. Abdominal aortogram. 2. Bilateral selective renal angiography. 3. Right renal PTA and stenting.  PROCEDURAL INDICATION:  Ms. Kari Robinson is a 60 year old woman with malignant hypertension.  She was referred for evaluation after diagnosis of severe right renal artery stenosis was made.  This diagnosis was made by CT angiography and confirmed by renal duplex ultrasound which showed severely elevated velocities across the right renal ostium.  The CT scan showed critical ostial right renal artery stenosis with calcification. There was also a notation of the appearance of fibromuscular dysplasia. The left renal artery showed moderate stenosis by CT scanning.  Risks and indications of procedure were reviewed with the patient. Informed consent was obtained.  The right groin was prepped and draped and anesthetized with 1% lidocaine using modified Seldinger technique. A 6-French sheath was placed in the right femoral artery.  A pigtail catheter was advanced and the suprarenal abdominal aortogram was performed under digital subtraction angiography.  This demonstrated moderately severe proximal right renal artery stenosis as well as moderate left renal artery stenosis.  There was also a typical beaded appearance of fibromuscular dysplasia.  I decided to use selective angiography and a 5-French crossover catheter was used to image both the right and left renal artery selectively.  There was marked pressure dampening of the right renal artery with a 50 mm pressure gradient with the catheter engaged into the renal artery.  There was  severe proximal stenosis, confirmed there was a mild appearance of fibromuscular dysplasia and there was no significant renal branch vessel involvement.  On the left renal artery, there was no significant pressure gradient. There was a 50-60% proximal stenosis present also with a mild appearance further down in the main renal artery of mild fibromuscular dysplasia changes.  At that point, I decided to move forward with PTA and stenting of the right renal artery.  A 6-French renal guide catheter was advanced and a stabilizer wire was advanced into an inferior branch of the renal artery.  The vessel was predilated with a 6 x 15 mm balloon which was taken at 10 atmospheres and appeared well expanded.  I tried to then place a 7 x 15 mm Express stent but the stent would not fit through the 6-French guide.  I had to change out the system to a 7-French sheath and 7-French guide.  The stabilizer was read advanced and the stent was carefully positioned, so that there was full ostial coverage.  There was an excellent angiographic result after the stent was deployed at 12 atmospheres.  Stent balloon was pulled back slightly and reinflated to 14 atmospheres over the lesion site.  There was 0% residual stenosis. The guide catheter was advanced through the stent and there was 0 residual pressure gradient.  Final angiography demonstrated wide patency of the  main right renal artery with no significant stenosis.  FINAL ASSESSMENT: 1. Severe ostial right renal artery stenosis, treated successfully     with renal artery PTA and stenting. 2. Moderate left renal artery stenosis without a significant pressure     gradient. 3. Bilateral renal fibromuscular dysplasia.  RECOMMENDATIONS:  The patient should continue on dual antiplatelet therapy with aspirin and Plavix.  She is already on these medications for history of coronary stenting and apparently has been advised to stay on lifelong.  She should have  followup duplex ultrasonography at 6 months.     Juanda Bond. Burt Knack, MD     MDC/MEDQ  D:  01/13/2011  T:  01/14/2011  Job:  [id number]  cc:   Louis Meckel, M.D. Wallis Bamberg. Johnsie Cancel, MD, [facility]  Electronically Signed by Sherren Mocha MD on 01/20/2011 09:10:33 PM

## 2011-01-22 NOTE — Letter (Addendum)
Summary: [facility]   Imported By: Marilynne Drivers 01/15/2011 14:20:25  _____________________________________________________________________  External Attachment:    Type:   Image     Comment:   External Document

## 2011-01-27 ENCOUNTER — Encounter (INDEPENDENT_AMBULATORY_CARE_PROVIDER_SITE_OTHER): Payer: Medicaid Other | Admitting: Physician Assistant

## 2011-01-27 ENCOUNTER — Encounter: Payer: Self-pay | Admitting: Physician Assistant

## 2011-01-27 DIAGNOSIS — R0989 Other specified symptoms and signs involving the circulatory and respiratory systems: Secondary | ICD-10-CM

## 2011-01-27 NOTE — Op Note (Addendum)
Summary: [facility] Peripheral Vascular Invasive Procedure  [facility] Peripheral Vascular Invasive Procedure   Imported By: Roddie Mc 01/22/2011 10:55:40  _____________________________________________________________________  External Attachment:    Type:   Image     Comment:   External Document

## 2011-02-03 NOTE — Assessment & Plan Note (Signed)
Summary: Patient never seen; not in lobby when called   History of Present Illness: Patient never seen. She was not in the lobby when the [facility] went to get her.   Allergies: 1)  ! Celexa

## 2011-04-03 NOTE — Cardiovascular Report (Signed)
[place]  Patient:    Kari Robinson, Kari Robinson                          MRN: [id number] Proc. Date: 06/07/01 Adm. Date:  [id number] Attending:  Horatio Pel CC:         Lynnell Chad. Shelia Media, M.D.  Cardiac Catheterization Laboratory   Cardiac Catheterization  REFERRING PHYSICIAN:  Lynnell Chad. Shelia Media, M.D.  PROCEDURES: 1. Left heart catheterization. 2. Coronary cineangiography. 3. Left ventricular cineangiography. 4. Abdominal aortogram. 5. Perclose of the right femoral artery.  INDICATIONS FOR PROCEDURE:  This 60 year old female has a long history of severe hypertension, difficult to control, and presented to the emergency room with severe chest pain and mildly positive cardiac enzymes.  She was then scheduled for cardiac catheterization.  DESCRIPTION OF PROCEDURE:  After signing an informed consent, the patient was transported from her bed at [facility] to the [facility] Cardiac Catheterization Lab.  Her right groin was prepped and draped in a sterile fashion and anesthetized locally with 1% lidocaine.  A #6 French introducer sheath was inserted percutaneously into the right femoral artery. The 6 [place] #4 Judkins coronary catheters were used to make injections into the coronary arteries.  A 6 French pigtail catheter was used to measure pressures in the left ventricle and aorta and to make mid stream injections into the left ventricle and abdominal aorta.  The patient tolerated the procedure well and no complications were noted.  At the end of the procedure, the catheter and sheath were removed from the right femoral artery and hemostasis was easily obtained with a Perclose closure system.  MEDICATIONS GIVEN:  Versed 1 mg IV prior to the procedure.  Versed 2 mg IV prior to the procedure.  HEMODYNAMIC DATA:  Left ventricular pressure 174/10-22, aortic pressure 174/95 with a mean of 123.  Left ventricular ejection fraction was  measured at 65%.  CINE FINDINGS:  CORONARY CINEANGIOGRAPHY:  Left coronary artery:  The ostium and left main appear normal.  Left anterior descending:  The proximal LAD has very mild calcification.  This is in the area of the first septal perforator.  The first septal perforator is a large vessel supplying the basilar area of the septum and has an 80% ostial lesion which is followed by poststenotic dilatation.  The remainder of the LAD appears normal.  Circumflex coronary artery:  The circumflex is a large dominant vessel supplying the posterolateral circulation to the left ventricle.  There is a mild 20% stenotic plaque in the proximal segment and a moderate 50% stenotic plaque in a middle segment in a large second obtuse marginal branch.  Right coronary artery:  The right coronary artery is a small nondominant vessel and appears normal.  LEFT VENTRICULAR CINEANGIOGRAM:  The left ventricular chamber size, contractility and wall thickness appear normal.  The mitral valve appears normal without significant insufficiency.  There was no calcification or prolapse.  The aortic valve appears normal.  ABDOMINAL AORTOGRAM:  The abdominal aorta has mild plaque with moderate calcification in its distal segment.  Both renal arteries are abnormal with severe focal stenotic lesions in there proximal segment of approximately 80%. There is mild fibromuscular dysplasia in the distal portion of both renal arteries.  FINAL DIAGNOSES: 1. Moderate coronary artery disease with moderate stenosis of the mid    circumflex coronary artery and severe stenosis of the first septal    perforator. 2. Normal  left ventricular function. 3. Normal-appearing mitral and aortic valves. 4. Mild plaque with moderate calcification of the distal abdominal aorta. 5. Severe bilateral renal artery stenosis. 6. Successful Perclose of the right femoral artery.  DISPOSITION:  Will return to [facility] where  several other diagnostic studies have been ordered by Dr. Shelia Media.  We will continue medical treatment of her coronary artery disease and hypertension at present.  We will schedule her for duplex ultrasound of her renal arteries as an outpatient in our office to determine the flow velocity in both renal arteries.  If her flow velocity is markedly elevated, she would be a candidate for angioplasty of both renal arteries. DD:  06/07/01 TD:  06/07/01 Job: [id number]

## 2011-04-03 NOTE — Discharge Summary (Signed)
[facility]  Patient:    Kari Robinson, Kari Robinson                          MRN: [id number] Adm. Date:  [id number] Disc. Date: [id number] Attending:  Lisbeth Renshaw                           Discharge Summary  EKG normal sinus rhythm, prolonged QT, nonspecific ST-T changes.  LABORATORY DATA:  His urinalysis is normal, TSH normal.  Troponin slightly high but CK and CK-MBs are normal, maximum troponin I 0.26.  CMET normal. Single nonfasting glucose was slightly high at 143.  Lipase normal.  HOSPITAL COURSE:  Please see admission history for details of her presentation.  Briefly, she woke up with substernal chest pain and came to the emergency room for evaluation.  She was seen in consultation by Dr. Myrtice Lauth of cardiology.  She was monitored on telemetry and ruled out for myocardial infarction.  She stayed on the heparin.  She told me that her depression had come back, she quit Celexa 1-1/2 years ago and had not been coming to the doctors for a good while also.  She had been using too much alcohol, as well, and smoking.  She agreed to cardiac catheterization and we placed her on a nicotine patch in order to help her with the nicotine cravings.  The catheterization showed moderate coronary artery disease, 50% mid circumflex stenosis, and an ostial 80% stenosis of first septal branch, normal RCA, normal LV function, bilateral severe renal artery stenosis.  He recommended medical treatment of the coronary artery disease with nitroglycerin, aspirin, beta blockers, and statins.  He recommended also a duplex ultrasound of renal arteries at the office.  At 3 p.m. that same day, she left against medical advice.  MEDICATIONS AT TIME OF DISCHARGE: 1. Altace 5 mg daily. 2. Metoprolol 100 mg each morning, 50 mg one half tablet every p.m. 3. Celexa 40 mg one half tablet each morning. 4. Nitroglycerin 0.4 mg sublingual every 5 minutes x 3 p.r.n. chest pain. 5. Aspirin 81  mg daily. 6. Lipitor 20 mg one half tablet daily.  She was given the telephone number for medical assistance with medicine cost, [phone].  She was requested not to use alcohol or smoke.  She is to go to Dr. Linnell Fulling office the following morning for follow-up laboratories - lipid profile, and to the pulmonary department at [facility] to confirm appointment for pulmonary function studies.  IMPRESSION: 1. Unstable angina.  Cardiac catheterization, July 2002. 2. Recurrent depression. 3. Excess ethanol use. 4. Atherosclerotic peripheral vascular disease - severe renal artery stenosis. 5. Empiric glucose tolerance. 6. Abnormal baseline electrocardiogram. 7. Hypertension. DD:  06/18/01 TD:  06/20/01 Job: [id number]

## 2011-04-03 NOTE — H&P (Signed)
[facility]  Patient:    Kari Robinson, Kari Robinson                          MRN: [id number] Adm. Date:  06/04/01 Attending:  Lynnell Chad. Shelia Media, M.D. Dictator:   Mar Daring, P.A.C.                         History and Physical  DATE OF BIRTH:  22-Aug-1951  CHIEF COMPLAINT:  Chest pain.  HISTORY OF PRESENT ILLNESS:  The patient is a 61 year old white female patient of Dr. Shelia Media who is admitted for further evaluation of chest pain.  She has a history of hypertension and depression, having been off medications for each for 1-1/2 years secondary to financial constraints.  She was awakening this morning with pressure on her chest substernally, described as "an elephant sitting on my chest."  She describes secondary shortness of breath, nausea, a small amount of vomiting with clear emesis, and diaphoresis.  Pain did not radiate into the neck or arms.  Onset of pain again was while sleeping, and not related to food or exertion.  She has no history of heart disease, and has never had chest pain previously.  They did call 911.  Paramedics came.  They gave four baby aspirin and nitroglycerin, and the pain was relieved immediately.  Pain has not recurred since treatment as described.  In the emergency room, her troponin was mildly elevated at 0.05.  CK-MB was normal at 2.2.  Her EKG did show a normal sinus rhythm with nonspecific ST and T wave abnormalities.  The patient has been admitted for further treatment and evaluation, specifically to rule out myocardial infarction and obtain cardiac consultation.  CURRENT MEDICATIONS:  None.  Was on Maxzide, klonodine, and Celexa 1-1/2 years ago.  ALLERGIES:  No known drug allergies.  FAMILY HISTORY:  The patient is adopted, and is unaware of her maternal family history.  She does know that her father has emphysema, and her paternal grandmother died with emphysema.  PAST MEDICAL HISTORY: 1. Hypertension, treated  with Maxzide and klonodine, off medications for 1-1/2    years secondary to financial reasons. 2. Depression, was treated with Celexa, now off medications for 1-1/2 years.  PAST SURGICAL HISTORY:  Tubal ligation.  SOCIAL HISTORY:  She is married and has five children.  Two of these children are adopted.  She drinks at least 3 beers daily, and one case of beer over a weekend.  Has smoked 2 packs per day for 35 years.  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  REVIEW OF SYSTEMS:  Positive for decreasing energy over the last several years with weight gain during this same period.  Also does admit to being depressed and would like to resume treatment for that.  Also positive for chest pain as described in history of present illness.  Otherwise, she denies any recent cold symptoms, headaches, cough, chest congestion, abdominal pain, change in bowel or bladder habits, or peripheral edema.  LABORATORY DATA:  CBC:  Normal.  INR normal at 1.0.  Comprehensive metabolic panel normal.  Initial troponin elevated at 0.05.  Initial CK-MB normal at 2.2.  Chest x-ray results have not yet been dictated into computer.  PHYSICAL EXAMINATION:  VITAL SIGNS:  Age:  38.  O2 saturation 100% on nasal cannula.  Pulse is 72.  Blood pressure 168/107.  GENERAL:  A pleasant, well-appearing white female in no acute distress.  She is breathing comfortably laying supine with nasal cannula in place.  SKIN:  Warm and dry.  There is no rash.  She does have a small tattoo on the right wrist.  HEENT:  Tympanic membranes are clear.  Normocephalic, atraumatic.  She is edentulous except for four teeth along the lower gum line.  NECK:  Supple, no lymphadenopathy or bruit.  LUNGS:  Clear to auscultation bilaterally.  CARDIOVASCULAR:  Normal sinus rhythm, no murmurs, rubs, or gallops.  ABDOMEN:  Obese, bowel sounds are present throughout, soft,  nontender, nondistended, no rebound, mass, or organomegaly.  EXTREMITIES:  Without clubbing, cyanosis, or edema.  NEUROLOGIC:  Cranial nerves II-XII grossly intact.  Normal speech.  No tremor. Gait is not tested.  ASSESSMENT: 1. Chest pain, rule out myocardial infarction. 2. Hypertension, currently untreated. 3. Chronic smoker, two packs per day x 35 years. 4. Daily alcohol use. 5. Depression, currently untreated. 6. The patient is adopted, incomplete maternal family history. 7. Uncertain status of lipids.  PLAN: 1. Will admit to Dr. Shelia Media for further evaluation and treatment. 2. Will monitor cardiac enzymes, place on telemetry, and monitor serial EKGs. 3. Will need to resume treatment of hypertension.  Dr. Melvern Banker has completed    cardiac consultation as well today, and I believe scheduled catheterization    for tomorrow. DD:  06/05/01 TD:  06/06/01 Job: [id number]

## 2011-12-24 ENCOUNTER — Ambulatory Visit: Payer: Medicaid Other | Admitting: Internal Medicine

## 2012-01-05 ENCOUNTER — Encounter: Payer: Self-pay | Admitting: *Deleted

## 2012-01-19 ENCOUNTER — Ambulatory Visit (INDEPENDENT_AMBULATORY_CARE_PROVIDER_SITE_OTHER): Payer: Medicaid Other | Admitting: Cardiovascular Disease

## 2012-01-19 ENCOUNTER — Encounter: Payer: Self-pay | Admitting: Cardiovascular Disease

## 2012-01-19 DIAGNOSIS — I2510 Atherosclerotic heart disease of native coronary artery without angina pectoris: Secondary | ICD-10-CM

## 2012-01-19 DIAGNOSIS — I701 Atherosclerosis of renal artery: Secondary | ICD-10-CM

## 2012-01-19 DIAGNOSIS — I10 Essential (primary) hypertension: Secondary | ICD-10-CM

## 2012-01-19 DIAGNOSIS — I6529 Occlusion and stenosis of unspecified carotid artery: Secondary | ICD-10-CM

## 2012-01-19 DIAGNOSIS — R9431 Abnormal electrocardiogram [ECG] [EKG]: Secondary | ICD-10-CM

## 2012-01-19 NOTE — Patient Instructions (Signed)
Your physician has requested that you have a carotid duplex. This test is an ultrasound of the carotid arteries in your neck. It looks at blood flow through these arteries that supply the brain with blood. Allow one hour for this exam. There are no restrictions or special instructions.  Your physician has requested that you have a lexiscan myoview. For further information please visit [email or web address]. Please follow instruction sheet, as given.  Your physician recommends that you continue on your current medications as directed. Please refer to the Current Medication list given to you today.  Your physician wants you to follow-up in: 1 YEAR. You will receive a reminder letter in the mail two months in advance. If you don't receive a letter, please call our office to schedule the follow-up appointment.

## 2012-01-19 NOTE — Progress Notes (Signed)
HPI:  61 year old woman presenting for followup evaluation. She has diffuse vascular disease with carotid stenosis, coronary artery disease, and renal artery stenosis. The patient underwent renal artery stenting for treatment of critical right renal artery stenosis in 2011. She underwent coronary stenting the previous year in [place]. She's been noted to have 60-80% bilateral carotid stenosis with no history of stroke or TIA. She hasn't been seen in followup and greater than 12 months and was temporarily living in [place]. She was noted to have carotid bruits on evaluation by her primary care provider and was referred back for further treatment.  The patient complains of fatigue and lack of energy. She has dyspnea at rest and with exertion. She has smoked for about 50 years and continues to do so. She denies orthopnea, PND, palpitations, chest pain, or chest pressure. She has no stroke or TIA symptoms. She specifically denies symptoms of amaurosis fugax, aphasia, or clumsiness or weakness of her extremities. She has been compliant with her medications.  Outpatient Encounter Prescriptions as of 01/19/2012  Medication Sig Dispense Refill  . aspirin 81 MG tablet Take 81 mg by mouth daily.      . Cholecalciferol (VITAMIN D3) 1000 UNITS CAPS Take 1,000 Units by mouth 2 (two) times daily.      . Choline Fenofibrate 135 MG capsule Take 135 mg by mouth daily.      . clopidogrel (PLAVIX) 75 MG tablet Take 75 mg by mouth daily.      . DULoxetine (CYMBALTA) 60 MG capsule Take 60 mg by mouth daily.      [name] gabapentin (NEURONTIN) 300 MG capsule Take 300 mg by mouth 3 (three) times daily.      . isosorbide dinitrate (ISORDIL) 20 MG tablet Take 20 mg by mouth 2 (two) times daily.      [name] lisinopril-hydrochlorothiazide (PRINZIDE,ZESTORETIC) 20-12.5 MG per tablet Take 1 tablet by mouth daily.      . metFORMIN (GLUCOPHAGE) 500 MG tablet Take 500 mg by mouth 2 (two) times daily with a meal.      . metoprolol succinate  (TOPROL-XL) 50 MG 24 hr tablet Take 50 mg by mouth daily. Take with or immediately following a meal.      . niacin (NIASPAN) 1000 MG CR tablet Take 1,000 mg by mouth at bedtime.      . nitroGLYCERIN (NITROSTAT) 0.4 MG SL tablet Place 0.4 mg under the tongue every 5 (five) minutes as needed.      . rosuvastatin (CRESTOR) 40 MG tablet Take 40 mg by mouth daily.      [name] zolpidem (AMBIEN) 10 MG tablet Take 10 mg by mouth at bedtime as needed.      [name] DISCONTD: simvastatin (ZOCOR) 40 MG tablet Take 40 mg by mouth every evening.        Allergies  Allergen Reactions  . Citalopram Hydrobromide     REACTION: Reaction not known    Past Medical History  Diagnosis Date  . RAS (renal artery stenosis) 2002    by cath tysinger  . CAD (coronary artery disease)     moderate primarily septal perforater  . Hypercholesterolemia     primarily ldl-p and small particles  . Alcohol abuse   . Smoking   . HTN (hypertension)   . Depression   . Arthralgia     ROS: Negative except as per HPI  BP 120/84  Pulse 101  Ht 5\' 4"  (1.626 m)  Wt 86.637 kg (191 lb)  BMI 32.79  kg/m2  PHYSICAL EXAM: Pt is alert and oriented, overweight woman in NAD HEENT: normal Neck: JVP - normal, carotids 2+= with bilateral bruits right greater than left Lungs: CTA bilaterally with prolonged expiration CV: RRR without murmur or gallop Abd: soft, NT, Positive BS, no hepatomegaly Ext: no C/C/E, distal pulses intact and equal Skin: warm/dry no rash  EKG:  Normal sinus rhythm with frequent PVCs, heart rate 101 beats per minute, ST and T wave abnormality consider inferolateral ischemia.  ASSESSMENT AND PLAN:

## 2012-01-19 NOTE — Assessment & Plan Note (Signed)
Status post right renal artery stenting. Good blood pressure control noted. Continue current program.

## 2012-01-19 NOTE — Assessment & Plan Note (Signed)
The patient has no symptoms of angina. She remains on aspirin and Plavix. I compared her EKG to her previous one on file. There are no significant ST and T-wave changes with inferolateral ST depression and PVCs. Her previous EKG was within normal limits. She will require a pharmacologic stress Myoview study to rule out significant ischemia.

## 2012-01-19 NOTE — Assessment & Plan Note (Signed)
Well-controlled on current medical program.

## 2012-01-19 NOTE — Assessment & Plan Note (Signed)
The patient's last duplex scan from 2001 was reviewed. At that time, she had moderate bilateral internal carotid artery stenoses with 60-79% stenosis on both sides. She has had no stroke or TIA symptoms. She is on good medical program. She'll have a followup carotid duplex scan performed. She understands the detrimental effects of continued tobacco.

## 2012-01-28 ENCOUNTER — Other Ambulatory Visit (HOSPITAL_COMMUNITY): Payer: Medicaid Other

## 2012-02-22 ENCOUNTER — Ambulatory Visit (HOSPITAL_COMMUNITY): Payer: Medicaid Other

## 2012-02-25 ENCOUNTER — Encounter (INDEPENDENT_AMBULATORY_CARE_PROVIDER_SITE_OTHER): Payer: Medicaid Other

## 2012-02-25 DIAGNOSIS — R42 Dizziness and giddiness: Secondary | ICD-10-CM

## 2012-02-25 DIAGNOSIS — I2510 Atherosclerotic heart disease of native coronary artery without angina pectoris: Secondary | ICD-10-CM

## 2012-02-25 DIAGNOSIS — R0989 Other specified symptoms and signs involving the circulatory and respiratory systems: Secondary | ICD-10-CM

## 2012-02-25 DIAGNOSIS — I6529 Occlusion and stenosis of unspecified carotid artery: Secondary | ICD-10-CM

## 2012-02-25 DIAGNOSIS — R9431 Abnormal electrocardiogram [ECG] [EKG]: Secondary | ICD-10-CM

## 2012-02-29 ENCOUNTER — Other Ambulatory Visit (HOSPITAL_COMMUNITY): Payer: Medicaid Other

## 2012-03-03 ENCOUNTER — Ambulatory Visit (HOSPITAL_COMMUNITY): Payer: Medicaid Other | Attending: Cardiology

## 2012-03-03 DIAGNOSIS — R0989 Other specified symptoms and signs involving the circulatory and respiratory systems: Secondary | ICD-10-CM

## 2012-03-03 DIAGNOSIS — R9431 Abnormal electrocardiogram [ECG] [EKG]: Secondary | ICD-10-CM

## 2012-03-03 DIAGNOSIS — I6529 Occlusion and stenosis of unspecified carotid artery: Secondary | ICD-10-CM

## 2012-03-03 DIAGNOSIS — I2510 Atherosclerotic heart disease of native coronary artery without angina pectoris: Secondary | ICD-10-CM

## 2012-03-08 ENCOUNTER — Other Ambulatory Visit (HOSPITAL_COMMUNITY): Payer: Medicaid Other

## 2016-05-21 ENCOUNTER — Encounter: Attending: Family Medicine | Primary: Family

## 2016-05-26 DIAGNOSIS — J449 Chronic obstructive pulmonary disease, unspecified: Principal | ICD-10-CM

## 2016-05-26 MED ORDER — PROAIR HFA 108 (90 BASE) MCG/ACT IN AERS
2 refills | Status: CP
Start: 2016-05-26 — End: ?

## 2016-06-02 ENCOUNTER — Encounter: Attending: Internal Medicine | Primary: Family

## 2016-07-20 DIAGNOSIS — I10 Essential (primary) hypertension: Principal | ICD-10-CM

## 2016-07-21 MED ORDER — HYDRALAZINE HCL 50 MG PO TABS
2 refills | Status: CP
Start: 2016-07-21 — End: 2016-10-07

## 2016-07-28 ENCOUNTER — Encounter: Attending: Critical Care Medicine | Primary: Family

## 2016-07-28 ENCOUNTER — Encounter: Primary: Family

## 2016-08-11 ENCOUNTER — Encounter: Primary: Family

## 2016-08-11 ENCOUNTER — Encounter: Attending: Critical Care Medicine | Primary: Family

## 2016-08-25 ENCOUNTER — Encounter: Attending: Internal Medicine | Primary: Family

## 2016-08-27 ENCOUNTER — Inpatient Hospital Stay: Admit: 2016-08-27 | Discharge: 2016-08-28 | Primary: Family

## 2016-08-27 DIAGNOSIS — N6011 Diffuse cystic mastopathy of right breast: Secondary | ICD-10-CM

## 2016-08-27 DIAGNOSIS — R928 Other abnormal and inconclusive findings on diagnostic imaging of breast: Secondary | ICD-10-CM

## 2016-08-27 DIAGNOSIS — Z1231 Encounter for screening mammogram for malignant neoplasm of breast: Principal | ICD-10-CM

## 2016-09-10 ENCOUNTER — Ambulatory Visit: Admit: 2016-09-10 | Discharge: 2016-09-10 | Attending: Internal Medicine | Primary: Family

## 2016-09-10 DIAGNOSIS — C801 Malignant (primary) neoplasm, unspecified: Secondary | ICD-10-CM

## 2016-09-10 DIAGNOSIS — Z7951 Long term (current) use of inhaled steroids: Secondary | ICD-10-CM

## 2016-09-10 DIAGNOSIS — Z8249 Family history of ischemic heart disease and other diseases of the circulatory system: Secondary | ICD-10-CM

## 2016-09-10 DIAGNOSIS — I129 Hypertensive chronic kidney disease with stage 1 through stage 4 chronic kidney disease, or unspecified chronic kidney disease: Secondary | ICD-10-CM

## 2016-09-10 DIAGNOSIS — D631 Anemia in chronic kidney disease: Secondary | ICD-10-CM

## 2016-09-10 DIAGNOSIS — J449 Chronic obstructive pulmonary disease, unspecified: Principal | ICD-10-CM

## 2016-09-10 DIAGNOSIS — R809 Proteinuria, unspecified: Secondary | ICD-10-CM

## 2016-09-10 DIAGNOSIS — I2510 Atherosclerotic heart disease of native coronary artery without angina pectoris: Secondary | ICD-10-CM

## 2016-09-10 DIAGNOSIS — E1122 Type 2 diabetes mellitus with diabetic chronic kidney disease: Principal | ICD-10-CM

## 2016-09-10 DIAGNOSIS — E872 Acidosis: Secondary | ICD-10-CM

## 2016-09-10 DIAGNOSIS — N189 Chronic kidney disease, unspecified: Secondary | ICD-10-CM

## 2016-09-10 DIAGNOSIS — Z833 Family history of diabetes mellitus: Secondary | ICD-10-CM

## 2016-09-10 DIAGNOSIS — I252 Old myocardial infarction: Secondary | ICD-10-CM

## 2016-09-10 DIAGNOSIS — N184 Chronic kidney disease, stage 4 (severe): Secondary | ICD-10-CM

## 2016-09-10 DIAGNOSIS — Z955 Presence of coronary angioplasty implant and graft: Secondary | ICD-10-CM

## 2016-09-10 DIAGNOSIS — Z79899 Other long term (current) drug therapy: Secondary | ICD-10-CM

## 2016-09-10 DIAGNOSIS — E118 Type 2 diabetes mellitus with unspecified complications: Secondary | ICD-10-CM

## 2016-09-10 DIAGNOSIS — I10 Essential (primary) hypertension: Secondary | ICD-10-CM

## 2016-09-10 DIAGNOSIS — Z85118 Personal history of other malignant neoplasm of bronchus and lung: Secondary | ICD-10-CM

## 2016-09-10 DIAGNOSIS — F17210 Nicotine dependence, cigarettes, uncomplicated: Secondary | ICD-10-CM

## 2016-09-10 MED ORDER — LISINOPRIL 20 MG PO TABS
20 mg | Freq: Every day | ORAL | 3 refills | Status: CP
Start: 2016-09-10 — End: ?

## 2016-09-10 NOTE — Patient Instructions
Plan for fistula if renal function stays below 20 ml/min  Low salt diet, avoid canned and pre-made foods  Baking soda, 1/2 tsp twice a day  Stop Hydralazine  Increase lisinopril to 20mg  daily  Labs in 2-3 weeks  Low potassium diet

## 2016-09-10 NOTE — Patient Instructions
Plan for fistula if renal function stays below 20 ml/min  Low salt diet, avoid canned and pre-made foods; frozen are fine  Baking soda, 1/2 tsp twice a day  Stop Hydralazine  Increase lisinopril to 20mg  daily  Labs in 2 weeks  Low potassium diet, pamphlet provided

## 2016-09-10 NOTE — Patient Instructions
Plan for fistula if renal function stays below 20 ml/min  Low salt diet, avoid canned and pre-made foods  Baking soda, 1/2 tsp twice a day

## 2016-09-10 NOTE — Patient Instructions
Plan for fistula if renal function stays below 20 ml/min  Low salt diet

## 2016-09-10 NOTE — Patient Instructions
Plan for fistula if renal function stays below 20 ml/min  Low salt diet, avoid canned and pre-made foods; frozen are fine  Baking soda, 1/2 tsp twice a day  Stop Hydralazine  Increase lisinopril to 20mg  daily  Labs in 2-3 weeks  Low potassium diet

## 2016-09-11 NOTE — Progress Notes
CC : reduced renal function    HPI : 45F with a h/o CKD in the setting of DM, HTN comes in for evaluation of her severly reduced renal function. She was first told her kidney function was severly reduced about 6 months ago at the time of her lung CA surgery. She has had diabetes for years, which would often worsen her renal function when sugars were unregulated in the setting of frequent steroid use for her COPD.  She feels well. She is trying to limit her salt intake.  She does not check her BP at home.  She walks daily. Trying to quit smoking.       PMH: DM - diet controlled, lung CA, sp LU lobectomy (Feb 2017), COPD, HTN, MI, CAD/stents    Current Outpatient Medications    Medication Sig Start Date End Date Taking? Authorizing Provider   ALPRAZolam Prudy Feeler(XANAX) 0.25 MG Tablet Take 1 mg by mouth every 8 hours as needed for sleep.   Yes Information, Historical   amLODIPine (NORVASC) 10 MG Tablet Take 1 tablet by mouth daily. 02/13/16  Yes Registre, Joen LauraLouis J, MD   clopidogrel (PLAVIX) 75 MG Tablet Take 1 tablet by mouth daily. 02/13/16  Yes Registre, Joen LauraLouis J, MD   fluticasone-vilanterol (BREO ELLIPTA) 100-25 MCG/INH Aerosol Powder Breath Activated Inhale 1 puff daily. Rinse mouth well and spit after each use. 02/13/16  Yes Registre, Joen LauraLouis J, MD   hydrALAZINE (APRESOLINE) 50 MG Tablet TAKE ONE TABLET BY MOUTH THREE TIMES A DAY 07/21/16  Yes Registre, Joen LauraLouis J, MD   ipratropium-albuterol (DUONEB) 0.5-2.5 (3) MG/3ML Solution Take 3 mLs by nebulization every 6 hours as needed. 02/13/16  Yes Registre, Joen LauraLouis J, MD   lisinopril (PRINIVIL,ZESTRIL) 10 MG Tablet Take 1 tablet by mouth daily. 02/13/16  Yes Registre, Joen LauraLouis J, MD   metoprolol tartrate (LOPRESSOR) 50 MG Tablet Take 1 tablet by mouth 2 times daily. 02/13/16  Yes Registre, Joen LauraLouis J, MD   pantoprazole (PROTONIX) 40 MG Tablet Delayed Release Take 40 mg by mouth daily.   Yes Information, Historical   PROAIR HFA 108 (90 BASE) MCG/ACT Aerosol Solution INHALE 2 PUFFS EVERY 6

## 2016-09-11 NOTE — Progress Notes
HOURS AS NEEDED FOR WHEEZING. 05/26/16  Yes Registre, Joen LauraLouis J, MD   tiotropium (SPIRIVA) 18 MCG Capsule Inhale 2 inhalations from one capsule daily. (1 capsule=2 inhalations=18 mcg) 02/13/16  Yes Registre, Joen LauraLouis J, MD   varenicline (CHANTIX) 0.5 MG Tablet Take 1 tablet by mouth daily foor  3  Days; then 1 tablet twice  A  Day. 02/13/16  Yes Registre, Joen LauraLouis J, MD   varenicline (CHANTIX) 1 MG Tablet Take 1 tablet by mouth 2 times daily. Take with meals and full glass of water. 02/13/16  Yes Registre, Joen LauraLouis J, MD         FH : +HTN and + DM    SH : 1ppd, quit EtOH, no illicits     ROS     GENITOURINARY:  No dysuria, frequency or urgency, no frothy urine or hematuria,   no CVAT  CARDIOVASCULAR:  No PND, peripheral edema or orthopnea, no weight gain    CONSTITUTIONAL:  No fever or weight loss  RESPIRATORY:  + cough, +shortness of breath or wheezing  GASTROINTESTINAL:  No nausea, vomiting or diarrhea.  MUSCULOSKELETAL:  No joint pains or swelling  SKIN:  No change in skin, hair or nails.  NEUROLOGIC:  No paresthesias, fasciculations, seizures or weakness.  PSYCHIATRIC:  No disorder of thought or mood.  ENDOCRINE:  No heat or cold intolerance, polyuria or polydipsia.  HEMATOLOGICAL:  No easy bruising or bleeding  ENT: No trouble hearing or oral lesions        Vitals:    09/10/16 0945   BP: 117/58   Pulse: 54   Resp: 20   Temp: 36.6 ?C (97.9 ?F)   Weight: 74.1 kg (163 lb 4.8 oz)   Height: 1.626 m (5\' 4" )       General appearance: NAD, conversant   Eyes: Anicteric sclerae,  PERRL, no lid lag  HENT: Atraumatic; oropharynx clear with moist mucous membranes and no mucosal ulcerations  Neck: Trachea midline, supple, no thyromegaly  Respiratory: bronchia breath sounds B and no intercostal retractions  CV: No JVD, RRR, no murmurs, no lower extremity edema B  Abdomen: Soft, non-tender; no masses or HSM; kidneys not palpable  Musculoskeletal: normal gait, normal tone, good ROM in upper extremities  Skin: warm, no lesions

## 2016-09-11 NOTE — Progress Notes
Neutrophils Absolute Latest Ref Range: 1500 - 7800 cells/uL 4680   Lymphocytes Absolute Latest Ref Range: 850 - 3900 cells/uL 1490   Monocytes Absolute Latest Ref Range: 200 - 950 cells/uL 684   Eosinophils Absolute Latest Ref Range: 15 - 500 cells/uL 288   Basophils Absolute Latest Ref Range: 0 - 200 cells/uL 58   CBC AND DIFFERENTIAL Unknown Rpt     Results for Kristy Knapp, Kristy Knapp (MRN [id number]) as of 09/10/2016 10:13   Ref. Range 08/24/2016 06:59   Color -Ur Latest Ref Range: YELLOW  DARK YELLOW   pH -Ur Latest Ref Range: 5.0 - 8.0  5.5   Specific Gravity -Ur Latest Ref Range: 1.001 - 1.035  1.024   Bilirubin -Ur Latest Ref Range: NEGATIVE  NEGATIVE   Glucose -Ur Latest Ref Range: NEGATIVE  NEGATIVE   Hb -Ur Latest Ref Range: NEGATIVE  NEGATIVE   Leukocyte Esterase -Ur Latest Ref Range: NEGATIVE  1+ (A)   Protein-UA Latest Ref Range: NEGATIVE  2+ (A)   WBC -Ur Latest Ref Range: < OR = 5 /HPF 20-40 (A)   Ketones UA Latest Ref Range: NEGATIVE  NEGATIVE   RBC -Ur Latest Ref Range: < OR = 2 /HPF 0-2   Squam Epithel, UA Latest Ref Range: < OR = 5 /HPF 20-27 (A)   Hyaline Casts, UA Latest Ref Range: NONE SEEN /LPF 0-5 (A)   APPEARANCE Latest Ref Range: CLEAR  CLEAR           Impression and Plan    CKD in the setting of DM and HTN. Significant proteinuria  renal US  Plan for fistula if renal function stays below 20 ml/min  Low salt diet, avoid canned and pre-made foods; frozen are fine        HTN/Volume  Stop Hydralazine  Increase lisinopril to 20mg  daily  Labs in 2 weeks  Low potassium diet, pamphlet provided      MBD. 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 Hgb at goal   ESAs likely contraindicated given h/o lung CA

## 2016-09-11 NOTE — Progress Notes
Psych: Appropriate affect, normal mood, good judgement and insight, normal short term memory  Neuro: Sensation intact, no clonus      Results for DENNICE, TINDOL (MRN [id number]) as of 09/10/2016 10:13   Ref. Range 08/24/2016 06:59   SODIUM Latest Ref Range: 135 - 146 mmol/L 140   POTASSIUM, SERUM Latest Ref Range: 3.5 - 5.3 mmol/L 4.8   CHLORIDE, SERUM Latest Ref Range: 98 - 110 mmol/L 111 (H)   CARBON DIOXIDE TOTAL Latest Ref Range: 20 - 31 mmol/L 17 (L)   Urea Nitrogen Latest Ref Range: 7 - 25 mg/dL 34 (H)   CREATININE Latest Ref Range: 0.50 - 0.99 mg/dL 2.67 (H)   GLUCOSE, SERUM Latest Ref Range: 65 - 99 mg/dL 92   CALCIUM, SERUM Latest Ref Range: 8.6 - 10.4 mg/dL 9.6   BUN/Creatinine Ratio Latest Ref Range: 6 - 22 (calc) 13   ALBUMIN Latest Ref Range: 3.6 - 5.1 g/dL 4.4   Phosphorus Latest Ref Range: 2.1 - 4.3 mg/dL 4.4 (H)   EGFR Latest Ref Range: > OR = 60 mL/min/1.56m 18 (L)   Glom Filt Rate, Est African American Latest Ref Range: > OR = 60 mL/min/1.765m21 (L)       Results for HAKENNETHA, PEARMANMRN [id number] of 09/10/2016 10:13   Ref. Range 08/24/2016 06:59   PROTEIN/CREATININE RATIO Latest Ref Range: 21 - 161 mg/g creat 313 (H)     Results for HAKRITHI, BRAYMRN [id number] of 09/10/2016 10:13   Ref. Range 08/24/2016 06:59   WBC Latest Ref Range: 3.8 - 10.8 Thousand/uL 7.2   RBC Latest Ref Range: 3.80 - 5.10 Million/uL 3.90   HEMOGLOBIN Latest Ref Range: 11.7 - 15.5 g/dL 11.8   HEMATOCRIT Latest Ref Range: 35.0 - 45.0 % 35.9   MCV Latest Ref Range: 80.0 - 100.0 fL 92.1   MCH Latest Ref Range: 27.0 - 33.0 pg 30.3   MCHC Latest Ref Range: 32.0 - 36.0 g/dL 32.9   RDW Latest Ref Range: 11.0 - 15.0 % 13.6   PLATELET COUNT Latest Ref Range: 140 - 400 Thousand/uL 263   MPV Latest Ref Range: 7.5 - 12.5 fL 10.9   Neutrophils Latest Units: % 65   LYMPHOCYTES Latest Units: % 20.7   MONOCYTES Latest Units: % 9.5   EOSINOPHILS Latest Units: % 4.0   BASOPHILS Latest Units: % 0.8

## 2016-09-24 DIAGNOSIS — E118 Type 2 diabetes mellitus with unspecified complications: Secondary | ICD-10-CM

## 2016-09-24 DIAGNOSIS — N184 Chronic kidney disease, stage 4 (severe): Principal | ICD-10-CM

## 2016-09-29 ENCOUNTER — Encounter: Primary: Family

## 2016-10-05 ENCOUNTER — Inpatient Hospital Stay: Admit: 2016-10-05 | Discharge: 2016-10-06 | Primary: Family

## 2016-10-05 DIAGNOSIS — N281 Cyst of kidney, acquired: Secondary | ICD-10-CM

## 2016-10-05 DIAGNOSIS — E118 Type 2 diabetes mellitus with unspecified complications: Secondary | ICD-10-CM

## 2016-10-05 DIAGNOSIS — E1122 Type 2 diabetes mellitus with diabetic chronic kidney disease: Principal | ICD-10-CM

## 2016-10-05 DIAGNOSIS — N184 Chronic kidney disease, stage 4 (severe): Secondary | ICD-10-CM

## 2016-10-05 DIAGNOSIS — N261 Atrophy of kidney (terminal): Secondary | ICD-10-CM

## 2016-10-06 ENCOUNTER — Ambulatory Visit: Admit: 2016-10-06 | Discharge: 2016-10-06 | Attending: Internal Medicine | Primary: Family

## 2016-10-06 DIAGNOSIS — J449 Chronic obstructive pulmonary disease, unspecified: Secondary | ICD-10-CM

## 2016-10-06 DIAGNOSIS — R809 Proteinuria, unspecified: Secondary | ICD-10-CM

## 2016-10-06 DIAGNOSIS — I252 Old myocardial infarction: Secondary | ICD-10-CM

## 2016-10-06 DIAGNOSIS — C801 Malignant (primary) neoplasm, unspecified: Secondary | ICD-10-CM

## 2016-10-06 DIAGNOSIS — Z7951 Long term (current) use of inhaled steroids: Secondary | ICD-10-CM

## 2016-10-06 DIAGNOSIS — E1122 Type 2 diabetes mellitus with diabetic chronic kidney disease: Secondary | ICD-10-CM

## 2016-10-06 DIAGNOSIS — F17200 Nicotine dependence, unspecified, uncomplicated: Secondary | ICD-10-CM

## 2016-10-06 DIAGNOSIS — I129 Hypertensive chronic kidney disease with stage 1 through stage 4 chronic kidney disease, or unspecified chronic kidney disease: Principal | ICD-10-CM

## 2016-10-06 DIAGNOSIS — I2510 Atherosclerotic heart disease of native coronary artery without angina pectoris: Secondary | ICD-10-CM

## 2016-10-06 DIAGNOSIS — N261 Atrophy of kidney (terminal): Secondary | ICD-10-CM

## 2016-10-06 DIAGNOSIS — E785 Hyperlipidemia, unspecified: Secondary | ICD-10-CM

## 2016-10-06 DIAGNOSIS — D649 Anemia, unspecified: Secondary | ICD-10-CM

## 2016-10-06 DIAGNOSIS — Z955 Presence of coronary angioplasty implant and graft: Secondary | ICD-10-CM

## 2016-10-06 DIAGNOSIS — N184 Chronic kidney disease, stage 4 (severe): Secondary | ICD-10-CM

## 2016-10-06 DIAGNOSIS — Z79899 Other long term (current) drug therapy: Secondary | ICD-10-CM

## 2016-10-06 NOTE — Patient Instructions
Return in 3 months with labs 2 weeks prior

## 2016-10-06 NOTE — Progress Notes
Interval history: trying to quit smoking. Limiting K intake.  Walking daily. Checks BP at grocery stores: 120s/70s. No new meds or hospitalizations    PMH: DM - diet controlled, lung CA, sp LU lobectomy (Feb 2017), COPD, HTN, MI, CAD/stents    Current Outpatient Medications    Medication Sig Start Date End Date Taking? Authorizing Provider   ALPRAZolam Prudy Feeler(XANAX) 0.25 MG Tablet Take 1 mg by mouth every 8 hours as needed for sleep.   Yes Information, Historical   amLODIPine (NORVASC) 10 MG Tablet Take 1 tablet by mouth daily. 02/13/16  Yes Registre, Joen LauraLouis J, MD   clopidogrel (PLAVIX) 75 MG Tablet Take 1 tablet by mouth daily. 02/13/16  Yes Registre, Joen LauraLouis J, MD   fluticasone-vilanterol (BREO ELLIPTA) 100-25 MCG/INH Aerosol Powder Breath Activated Inhale 1 puff daily. Rinse mouth well and spit after each use. 02/13/16  Yes Registre, Joen LauraLouis J, MD   hydrALAZINE (APRESOLINE) 50 MG Tablet TAKE ONE TABLET BY MOUTH THREE TIMES A DAY 07/21/16 10/06/16 Yes Registre, Joen LauraLouis J, MD   ipratropium-albuterol (DUONEB) 0.5-2.5 (3) MG/3ML Solution Take 3 mLs by nebulization every 6 hours as needed. 02/13/16  Yes Registre, Joen LauraLouis J, MD   lisinopril (PRINIVIL,ZESTRIL) 20 MG Tablet Take 1 tablet by mouth daily. 09/10/16  Yes Harden MoIlic, Ljubomir M., MD   metoprolol tartrate (LOPRESSOR) 50 MG Tablet Take 1 tablet by mouth 2 times daily. 02/13/16  Yes Registre, Joen LauraLouis J, MD   pantoprazole (PROTONIX) 40 MG Tablet Delayed Release Take 40 mg by mouth daily.   Yes Information, Historical   PROAIR HFA 108 (90 BASE) MCG/ACT Aerosol Solution INHALE 2 PUFFS EVERY 6 HOURS AS NEEDED FOR WHEEZING. 05/26/16  Yes Registre, Joen LauraLouis J, MD   tiotropium (SPIRIVA) 18 MCG Capsule Inhale 2 inhalations from one capsule daily. (1 capsule=2 inhalations=18 mcg) 02/13/16  Yes Registre, Joen LauraLouis J, MD   varenicline (CHANTIX) 0.5 MG Tablet Take 1 tablet by mouth daily foor  3  Days; then 1 tablet twice  A  Day. 02/13/16 10/06/16  Myrtha Mantisegistre, Louis J, MD

## 2016-10-06 NOTE — Progress Notes
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 Hgb at goal   ESAs likely contraindicated given h/o lung CA

## 2016-10-06 NOTE — Progress Notes
varenicline (CHANTIX) 1 MG Tablet Take 1 tablet by mouth 2 times daily. Take with meals and full glass of water. 02/13/16 10/06/16  Myrtha Mantisegistre, Louis J, MD           ROS     GENITOURINARY:  No dysuria, frequency or urgency, no frothy urine or hematuria,   no CVAT  CARDIOVASCULAR:  No PND, peripheral edema or orthopnea, no weight gain            Vitals:    10/06/16 1539   BP: 139/61   Pulse: 60   Resp: 16   Temp: 36.5 ?C (97.7 ?F)   Weight: 74.1 kg (163 lb 6.4 oz)   Height: 1.626 m (5\' 4" )       General appearance: NAD, conversant   Eyes: Anicteric sclerae,  PERRL, no lid lag  HENT: Atraumatic; oropharynx clear with moist mucous membranes and no mucosal ulcerations  Neck: Trachea midline, supple, no thyromegaly  Respiratory: bronchia breath sounds B and no intercostal retractions  CV: No JVD, RRR, no murmurs, no lower extremity edema B  Abdomen: Soft, non-tender; no masses or HSM; kidneys not palpable  Musculoskeletal: normal gait, normal tone, good ROM in upper extremities  Skin: warm, no lesions   Psych: Appropriate affect, normal mood, good judgement and insight, normal short term memory  Neuro: Sensation intact, no clonus        Right kidney: 10.7 cm in length.  There is normal parenchymal echogenicity.  There are various   sized hypoechoic cystic lesions visualized, largest measuring 1.2 cm in the mid pole consistent   with renal cysts.  There is no hydronephrosis.   ?  Left kidney: Atrophic lobular left kidney measuring 7.9 cm in length. There are focal areas of   cortical thinning and increased cortical echogenicity suggestive of chronic kidney disease.    There is no hydronephrosis.         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 Significant proteinuria  Plan for fistula if renal function stays below 20 ml/min      HTN/Volume. BP at goal. Euvolemic  cw ACEI, DHP and BB      Hyperkalemia   Cw Low potassium diet, pamphlet provided    MBD. Acidosis resolved

## 2016-11-03 ENCOUNTER — Encounter: Attending: Internal Medicine | Primary: Family

## 2017-01-04 DIAGNOSIS — N189 Chronic kidney disease, unspecified: Principal | ICD-10-CM

## 2017-01-05 ENCOUNTER — Encounter: Attending: Internal Medicine | Primary: Family

## 2017-02-02 ENCOUNTER — Ambulatory Visit: Admit: 2017-02-02 | Discharge: 2017-02-02 | Attending: Internal Medicine | Primary: Family

## 2017-02-02 DIAGNOSIS — Z79899 Other long term (current) drug therapy: Secondary | ICD-10-CM

## 2017-02-02 DIAGNOSIS — I129 Hypertensive chronic kidney disease with stage 1 through stage 4 chronic kidney disease, or unspecified chronic kidney disease: Secondary | ICD-10-CM

## 2017-02-02 DIAGNOSIS — N250 Renal osteodystrophy: Secondary | ICD-10-CM

## 2017-02-02 DIAGNOSIS — E875 Hyperkalemia: Secondary | ICD-10-CM

## 2017-02-02 DIAGNOSIS — D631 Anemia in chronic kidney disease: Secondary | ICD-10-CM

## 2017-02-02 DIAGNOSIS — Z7902 Long term (current) use of antithrombotics/antiplatelets: Secondary | ICD-10-CM

## 2017-02-02 DIAGNOSIS — N184 Chronic kidney disease, stage 4 (severe): Principal | ICD-10-CM

## 2017-02-02 DIAGNOSIS — N281 Cyst of kidney, acquired: Secondary | ICD-10-CM

## 2017-02-02 NOTE — Progress Notes
Interval history:   Started on buspar. Salt intake minimal.  Watches K intake. Fluid intake about 2L-3L per day    PMH: DM - diet controlled, HTN    Current Outpatient Medications    Medication Sig Start Date End Date Taking? Authorizing Provider   ALPRAZolam Prudy Feeler(XANAX) 0.25 MG Tablet Take 1 mg by mouth every 8 hours as needed for sleep.   Yes Information, Historical   amLODIPine (NORVASC) 10 MG Tablet Take 1 tablet by mouth daily. 02/13/16  Yes Registre, Joen LauraLouis J, MD   clopidogrel (PLAVIX) 75 MG Tablet Take 1 tablet by mouth daily. 02/13/16  Yes Registre, Joen LauraLouis J, MD   fluticasone-vilanterol (BREO ELLIPTA) 100-25 MCG/INH Aerosol Powder Breath Activated Inhale 1 puff daily. Rinse mouth well and spit after each use. 02/13/16  Yes Registre, Joen LauraLouis J, MD   ipratropium-albuterol (DUONEB) 0.5-2.5 (3) MG/3ML Solution Take 3 mLs by nebulization every 6 hours as needed. 02/13/16  Yes Registre, Joen LauraLouis J, MD   lisinopril (PRINIVIL,ZESTRIL) 20 MG Tablet Take 1 tablet by mouth daily. 09/10/16  Yes Harden MoIlic, Ljubomir M., MD   metoprolol tartrate (LOPRESSOR) 50 MG Tablet Take 1 tablet by mouth 2 times daily. 02/13/16  Yes Registre, Joen LauraLouis J, MD   pantoprazole (PROTONIX) 40 MG Tablet Delayed Release Take 40 mg by mouth daily.   Yes Information, Historical   PROAIR HFA 108 (90 BASE) MCG/ACT Aerosol Solution INHALE 2 PUFFS EVERY 6 HOURS AS NEEDED FOR WHEEZING. 05/26/16  Yes Registre, Joen LauraLouis J, MD   tiotropium (SPIRIVA) 18 MCG Capsule Inhale 2 inhalations from one capsule daily. (1 capsule=2 inhalations=18 mcg) 02/13/16  Yes Registre, Joen LauraLouis J, MD           ROS     GENITOURINARY:  No dysuria, frequency or urgency, no frothy urine or hematuria,   no CVAT  CARDIOVASCULAR:  No PND, peripheral edema or orthopnea, no weight gain            Vitals:    02/02/17 1536   BP: 155/73   Pulse: 64   Resp: 18   Temp: 36.5 ?C (97.7 ?F)   Weight: 71.8 kg (158 lb 4.8 oz)   Height: 1.626 m (5\' 4" )       General appearance: NAD, conversant

## 2017-02-02 NOTE — Progress Notes
HEMOGLOBIN Latest Ref Range: 11.7 - 15.5 g/dL 14.711.6 (L)    HEMATOCRIT Latest Ref Range: 35.0 - 45.0 % 34.0 (L)    MCV Latest Ref Range: 80.0 - 100.0 fL 91.9    MCH Latest Ref Range: 27.0 - 33.0 pg 31.4    MCHC Latest Ref Range: 32.0 - 36.0 g/dL 82.934.1    RDW Latest Ref Range: 11.0 - 15.0 % 12.6    PLATELET COUNT Latest Ref Range: 140 - 400 Thousand/uL 290    MPV Latest Ref Range: 7.5 - 12.5 fL 10.9    Neutrophils Latest Units: % 61.2    LYMPHOCYTES Latest Units: % 24.7    MONOCYTES Latest Units: % 8.8    EOSINOPHILS Latest Units: % 4.3    BASOPHILS Latest Units: % 1.0    Neutrophils Absolute Latest Ref Range: 1500 - 7800 cells/uL 3794    Lymphocytes Absolute Latest Ref Range: 850 - 3900 cells/uL 1531    Monocytes Absolute Latest Ref Range: 200 - 950 cells/uL 546    Eosinophils Absolute Latest Ref Range: 15 - 500 cells/uL 267    Basophils Absolute Latest Ref Range: 0 - 200 cells/uL 62    CBC AND DIFFERENTIAL Unknown Rpt (A)    RAM Unknown See Comment    RENAL FUNCTION PANEL Unknown Rpt (A)          Impression and Plan    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 Severe macroalbuminuria.   Plan for fistula if renal function stays below 20 ml/min    L kidney cysts   Yearly imaging    HTN/Volume. BP at goal. Euvolemic  cw ACEI, DHP and BB   Plan to increase ACEI if hyperK resolved      Hyperkalemia   Cw Low potassium diet, pamphlet provided       MBD. Acidosis resolved. 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 Hgb at goal   ESAs likely contraindicated given h/o lung CA

## 2017-02-02 NOTE — Progress Notes
Eyes: Anicteric sclerae,  PERRL, no lid lag  HENT: Atraumatic; oropharynx clear with moist mucous membranes and no mucosal ulcerations  Neck: Trachea midline, supple, no thyromegaly  Respiratory: bronchia breath sounds B and no intercostal retractions  CV: No JVD, RRR, no murmurs, no lower extremity edema B  Abdomen: Soft, non-tender; no masses or HSM; kidneys not palpable  Musculoskeletal: normal gait, normal tone, good ROM in upper extremities  Skin: warm, no lesions   Psych: Appropriate affect, normal mood, good judgement and insight, normal short term memory  Neuro: Sensation intact, no clonus    Labs:   Results for Kristy Knapp, Kristy Knapp (MRN [id number]) as of 02/02/2017 16:36   Ref. Range 01/18/2017 12:42 01/18/2017 12:44   SODIUM Latest Ref Range: 135 - 146 mmol/L 144    POTASSIUM, SERUM Latest Ref Range: 3.5 - 5.3 mmol/L 5.1    CHLORIDE, SERUM Latest Ref Range: 98 - 110 mmol/L 114 (H)    CARBON DIOXIDE TOTAL Latest Ref Range: 20 - 31 mmol/L 21    Urea Nitrogen Latest Ref Range: 7 - 25 mg/dL 27 (H)    CREATININE Latest Ref Range: 0.50 - 0.99 mg/dL 2.82 (H)    GLUCOSE, SERUM Latest Ref Range: 65 - 99 mg/dL 113 (H)    CALCIUM, SERUM Latest Ref Range: 8.6 - 10.4 mg/dL 9.6    BUN/Creatinine Ratio Latest Ref Range: 6 - 22 (calc) 10    ALBUMIN Latest Ref Range: 3.6 - 5.1 g/dL 4.3    Phosphorus Latest Ref Range: 2.1 - 4.3 mg/dL 4.2    EGFR Latest Ref Range: > OR = 60 mL/min/1.24m 17 (L)    Glom Filt Rate, Est African American Latest Ref Range: > OR = 60 mL/min/1.771m19 (L)    PTH Latest Ref Range: 14 - 64 pg/mL 136 (H)    Creatinine, Ur Latest Ref Range: 20 - 320 mg/dL  399 (H)   MICROALBUMIN Latest Ref Range: See Note: mg/dL 91.5    PROTEIN, TOTAL, RANDOM UR Latest Ref Range: 5 - 24 mg/dL  141 (H)   PROTEIN/CREATININE RATIO Latest Ref Range: 21 - 161 mg/g creat  353 (H)   WBC Latest Ref Range: 3.8 - 10.8 Thousand/uL 6.2    RBC Latest Ref Range: 3.80 - 5.10 Million/uL 3.70 (L)

## 2017-03-16 ENCOUNTER — Encounter: Attending: Internal Medicine | Primary: Family

## 2017-03-30 ENCOUNTER — Encounter: Attending: Internal Medicine | Primary: Family

## 2018-06-06 ENCOUNTER — Encounter (HOSPITAL_COMMUNITY): Payer: Self-pay

## 2018-06-06 ENCOUNTER — Inpatient Hospital Stay (HOSPITAL_COMMUNITY)
Admission: EM | Admit: 2018-06-06 | Discharge: 2018-06-10 | DRG: 304 | Disposition: A | Discharge status: Home Health | Payer: Medicare HMO | Attending: Internal Medicine | Admitting: Internal Medicine

## 2018-06-06 ENCOUNTER — Emergency Department (HOSPITAL_COMMUNITY): Payer: Medicare HMO

## 2018-06-06 DIAGNOSIS — I701 Atherosclerosis of renal artery: Secondary | ICD-10-CM | POA: Diagnosis present

## 2018-06-06 DIAGNOSIS — E1129 Type 2 diabetes mellitus with other diabetic kidney complication: Secondary | ICD-10-CM | POA: Diagnosis present

## 2018-06-06 DIAGNOSIS — N186 End stage renal disease: Secondary | ICD-10-CM | POA: Diagnosis present

## 2018-06-06 DIAGNOSIS — F329 Major depressive disorder, single episode, unspecified: Secondary | ICD-10-CM | POA: Diagnosis present

## 2018-06-06 DIAGNOSIS — Z888 Allergy status to other drugs, medicaments and biological substances status: Secondary | ICD-10-CM

## 2018-06-06 DIAGNOSIS — E7800 Pure hypercholesterolemia, unspecified: Secondary | ICD-10-CM | POA: Diagnosis present

## 2018-06-06 DIAGNOSIS — N185 Chronic kidney disease, stage 5: Secondary | ICD-10-CM | POA: Diagnosis present

## 2018-06-06 DIAGNOSIS — Z7982 Long term (current) use of aspirin: Secondary | ICD-10-CM

## 2018-06-06 DIAGNOSIS — Q600 Renal agenesis, unilateral: Secondary | ICD-10-CM

## 2018-06-06 DIAGNOSIS — D649 Anemia, unspecified: Secondary | ICD-10-CM | POA: Diagnosis present

## 2018-06-06 DIAGNOSIS — I160 Hypertensive urgency: Secondary | ICD-10-CM | POA: Diagnosis present

## 2018-06-06 DIAGNOSIS — J449 Chronic obstructive pulmonary disease, unspecified: Secondary | ICD-10-CM | POA: Diagnosis present

## 2018-06-06 DIAGNOSIS — Z8673 Personal history of transient ischemic attack (TIA), and cerebral infarction without residual deficits: Secondary | ICD-10-CM

## 2018-06-06 DIAGNOSIS — I5031 Acute diastolic (congestive) heart failure: Secondary | ICD-10-CM | POA: Diagnosis not present

## 2018-06-06 DIAGNOSIS — F1010 Alcohol abuse, uncomplicated: Secondary | ICD-10-CM | POA: Diagnosis present

## 2018-06-06 DIAGNOSIS — I161 Hypertensive emergency: Principal | ICD-10-CM | POA: Diagnosis present

## 2018-06-06 DIAGNOSIS — Z7902 Long term (current) use of antithrombotics/antiplatelets: Secondary | ICD-10-CM

## 2018-06-06 DIAGNOSIS — I509 Heart failure, unspecified: Secondary | ICD-10-CM

## 2018-06-06 DIAGNOSIS — Z79899 Other long term (current) drug therapy: Secondary | ICD-10-CM

## 2018-06-06 DIAGNOSIS — R109 Unspecified abdominal pain: Secondary | ICD-10-CM | POA: Diagnosis present

## 2018-06-06 DIAGNOSIS — Z9114 Patient's other noncompliance with medication regimen: Secondary | ICD-10-CM

## 2018-06-06 DIAGNOSIS — I252 Old myocardial infarction: Secondary | ICD-10-CM

## 2018-06-06 DIAGNOSIS — I132 Hypertensive heart and chronic kidney disease with heart failure and with stage 5 chronic kidney disease, or end stage renal disease: Secondary | ICD-10-CM | POA: Diagnosis present

## 2018-06-06 DIAGNOSIS — E1122 Type 2 diabetes mellitus with diabetic chronic kidney disease: Secondary | ICD-10-CM | POA: Diagnosis present

## 2018-06-06 DIAGNOSIS — J9601 Acute respiratory failure with hypoxia: Secondary | ICD-10-CM | POA: Diagnosis present

## 2018-06-06 DIAGNOSIS — I2510 Atherosclerotic heart disease of native coronary artery without angina pectoris: Secondary | ICD-10-CM | POA: Diagnosis present

## 2018-06-06 DIAGNOSIS — Z85118 Personal history of other malignant neoplasm of bronchus and lung: Secondary | ICD-10-CM

## 2018-06-06 DIAGNOSIS — G8929 Other chronic pain: Secondary | ICD-10-CM | POA: Diagnosis present

## 2018-06-06 LAB — URINALYSIS, ROUTINE W REFLEX MICROSCOPIC
Bilirubin Urine: NEGATIVE
GLUCOSE, UA: NEGATIVE mg/dL
Hgb urine dipstick: NEGATIVE
Ketones, ur: NEGATIVE mg/dL
Leukocytes, UA: NEGATIVE
Nitrite: NEGATIVE
Specific Gravity, Urine: 1.013 (ref 1.005–1.030)
pH: 5 (ref 5.0–8.0)

## 2018-06-06 LAB — BASIC METABOLIC PANEL
ANION GAP: 10 (ref 5–15)
BUN: 39 mg/dL — ABNORMAL HIGH (ref 8–23)
CALCIUM: 9.1 mg/dL (ref 8.9–10.3)
CO2: 18 mmol/L — ABNORMAL LOW (ref 22–32)
Chloride: 118 mmol/L — ABNORMAL HIGH (ref 98–111)
Creatinine, Ser: 3.85 mg/dL — ABNORMAL HIGH (ref 0.44–1.00)
GFR, EST AFRICAN AMERICAN: 13 mL/min — AB (ref >60)
GFR, EST NON AFRICAN AMERICAN: 11 mL/min — AB (ref >60)
GLUCOSE: 101 mg/dL — AB (ref 70–99)
Potassium: 4.3 mmol/L (ref 3.5–5.1)
SODIUM: 146 mmol/L — AB (ref 135–145)

## 2018-06-06 LAB — CBC WITH DIFFERENTIAL/PLATELET
Abs Immature Granulocytes: 0 10*3/uL (ref 0.0–0.1)
BASOS ABS: 0 10*3/uL (ref 0.0–0.1)
Basophils Relative: 1 %
EOS ABS: 0.2 10*3/uL (ref 0.0–0.7)
EOS PCT: 3 %
HEMATOCRIT: 30.9 % — AB (ref 36.0–46.0)
HEMOGLOBIN: 9.2 g/dL — AB (ref 12.0–15.0)
Immature Granulocytes: 0 %
LYMPHS PCT: 17 %
Lymphs Abs: 1.2 10*3/uL (ref 0.7–4.0)
MCH: 28.9 pg (ref 26.0–34.0)
MCHC: 29.8 g/dL — AB (ref 30.0–36.0)
MCV: 97.2 fL (ref 78.0–100.0)
MONO ABS: 0.5 10*3/uL (ref 0.1–1.0)
Monocytes Relative: 7 %
Neutro Abs: 4.9 10*3/uL (ref 1.7–7.7)
Neutrophils Relative %: 72 %
Platelets: 220 10*3/uL (ref 150–400)
RBC: 3.18 MIL/uL — ABNORMAL LOW (ref 3.87–5.11)
RDW: 15.6 % — ABNORMAL HIGH (ref 11.5–15.5)
WBC: 6.9 10*3/uL (ref 4.0–10.5)

## 2018-06-06 LAB — PROTIME-INR
INR: 1.11
PROTHROMBIN TIME: 14.2 s (ref 11.4–15.2)

## 2018-06-06 LAB — TROPONIN I: Troponin I: 0.12 ng/mL (ref <0.03)

## 2018-06-06 LAB — BRAIN NATRIURETIC PEPTIDE: B Natriuretic Peptide: 2726.8 pg/mL — ABNORMAL HIGH (ref 0.0–100.0)

## 2018-06-06 LAB — MAGNESIUM: MAGNESIUM: 2 mg/dL (ref 1.7–2.4)

## 2018-06-06 LAB — APTT: aPTT: 31 seconds (ref 24–36)

## 2018-06-06 MED ORDER — FUROSEMIDE 10 MG/ML IJ SOLN
60.0000 mg | Freq: Once | INTRAMUSCULAR | Status: AC
  Administered 2018-06-07: 60 mg via INTRAVENOUS
  Filled 2018-06-06: qty 6

## 2018-06-06 NOTE — ED Triage Notes (Signed)
Pt. Arrived by EMS with reports of shob X2 days. Pt. States that she is feeling fine now. Pt. Reports hx. Of COPD, asthma. Pt. Reports having dialysis access in her stomach but has stopped dialysis and has not had it since May. Pt. Had occasional PVC's on EKG strip. A/O X4

## 2018-06-06 NOTE — ED Notes (Signed)
ED Provider at bedside. 

## 2018-06-06 NOTE — ED Provider Notes (Signed)
[facility] EMERGENCY DEPARTMENT Provider Note   CSN: [id number] Arrival date & time: 06/06/18  2048     History   Chief Complaint Chief Complaint  Patient presents with  . Shortness of Breath    HPI Kari Robinson is a 67 y.o. female.  HPI Hx of COPD, asthma, CKD and CAD presents with 2 days of exertional dyspnea. Pt also endorsing worsening cough in recent days but no increased sputum production or purulence. She reports orthopnea and PND and difficulty sleeping over the past few nights due to her SOB. She says she has been using her albuterol inhaler more than usual in recent days without relief. She denies chest pain, palpitations, tachycardia , increased lower extremity edema or pain. Notably, pt reports she began peritoneal dialysis in may. She only underwent 3 sessions but was having abdominal pain so this was stopped. She has had no dialysis in the interim and is pending placement of permanent vascular access for hemodialysis. She reports she still produces urine.  ROS also pertinent for dysuria and suprapubic pain over recent days. Denies urgency, frequency, hematuria  Past Medical History:  Diagnosis Date  . Alcohol abuse   . Arthralgia   . CAD (coronary artery disease)    moderate primarily septal perforater  . Depression   . HTN (hypertension)   . Hypercholesterolemia    primarily ldl-p and small particles  . RAS (renal artery stenosis) ([place]) 2002   by cath tysinger  . Smoking     Patient Active Problem List   Diagnosis Date Noted  . Occlusion and stenosis of carotid artery without mention of cerebral infarction 01/19/2012  . RENAL ARTERY STENOSIS 09/10/2010  . ABDOMINAL BRUIT 08/12/2010  . DEPRESSION 08/11/2010  . PERIPHERAL VASCULAR DISEASE 08/11/2010  . HYPERTENSION 08/08/2010  . CAD 08/08/2010  . ARTHRALGIA 08/08/2010    Past Surgical History:  Procedure Laterality Date  . abdominal aortogram     perclose of the right femoral artery   . CARDIAC CATHETERIZATION     left heart catheterization.  Coronary cineangiography. Lft ventricular cineangiography.    . TUBAL LIGATION       OB History   None      Home Medications    Prior to Admission medications   Medication Sig Start Date End Date Taking? Authorizing Provider  ALPRAZolam (XANAX) 0.25 MG tablet Take 0.25 mg by mouth 3 (three) times daily as needed for sleep. 04/28/18  Yes [provider]  amLODipine (NORVASC) 10 MG tablet Take 10 mg by mouth daily.   Yes [provider]  aspirin 81 MG tablet Take 81 mg by mouth daily.   Yes [provider]  atorvastatin (LIPITOR) 40 MG tablet Take 40 mg by mouth daily.   Yes [provider]  buPROPion (WELLBUTRIN SR) 150 MG 12 hr tablet Take 150 mg by mouth 2 (two) times daily. 03/17/18  Yes [provider]  Cholecalciferol (VITAMIN D3) 1000 UNITS CAPS Take 1,000 Units by mouth 2 (two) times daily.   Yes [provider]  clopidogrel (PLAVIX) 75 MG tablet Take 75 mg by mouth daily.   Yes [provider]  hydrALAZINE (APRESOLINE) 50 MG tablet Take 50 mg by mouth 3 (three) times daily.   Yes [provider]  isosorbide mononitrate (IMDUR) 30 MG 24 hr tablet Take 30 mg by mouth daily.   Yes [provider]  metoprolol succinate (TOPROL-XL) 50 MG 24 hr tablet Take 50 mg by mouth daily. Take  with or immediately following a meal.   Yes [provider]  torsemide (DEMADEX) 20 MG tablet Take 20 mg by mouth daily.   Yes [provider]  traMADol (ULTRAM) 50 MG tablet Take 50 mg by mouth every 6 (six) hours as needed for pain. for pain 04/26/18  Yes [provider]    Family History Family History  Problem Relation Age of Onset  . Emphysema Father     Social History Social History   Tobacco Use  . Smoking status: Current Every Day Smoker    Packs/day: 2.00    Years: 35.00    Pack years: 70.00    Types: Cigarettes  .  Smokeless tobacco: Never Used  Substance Use Topics  . Alcohol use: Yes    Comment: Drinks 3 beers a day and one case of beer over the weekend  . Drug use: Not on file     Allergies   Citalopram hydrobromide   Review of Systems Review of Systems  Constitutional: Negative for unexpected weight change.  Respiratory: Positive for shortness of breath.   Cardiovascular: Negative for chest pain.  Gastrointestinal: Negative for nausea and vomiting.  All other systems reviewed and are negative.    Physical Exam Updated Vital Signs BP (!) 179/97   Pulse 98   Resp (!) 21   Ht 5\' 4"  (1.626 m)   Wt 66.7 kg (147 lb)   SpO2 95%   BMI 25.23 kg/m   Physical Exam  Constitutional: She is oriented to person, place, and time. She appears well-developed.  HENT:  Head: Normocephalic and atraumatic.  Eyes: EOM are normal.  Neck: Normal range of motion. Neck supple.  Cardiovascular: Normal rate.  Pulmonary/Chest: Effort normal and breath sounds normal.  Abdominal: Bowel sounds are normal.  Neurological: She is alert and oriented to person, place, and time.  Skin: Skin is warm and dry.  Nursing note and vitals reviewed.    ED Treatments / Results  Labs (all labs ordered are listed, but only abnormal results are displayed) Labs Reviewed  BASIC METABOLIC PANEL - Abnormal; Notable for the following components:      Result Value   Sodium 146 (*)    Chloride 118 (*)    CO2 18 (*)    Glucose, Bld 101 (*)    BUN 39 (*)    Creatinine, Ser 3.85 (*)    GFR calc non Af Amer 11 (*)    GFR calc Af Amer 13 (*)    All other components within normal limits  BRAIN NATRIURETIC PEPTIDE - Abnormal; Notable for the following components:   B Natriuretic Peptide 2,726.8 (*)    All other components within normal limits  TROPONIN I - Abnormal; Notable for the following components:   Troponin I 0.12 (*)    All other components within normal limits  CBC WITH DIFFERENTIAL/PLATELET - Abnormal; Notable  for the following components:   RBC 3.18 (*)    Hemoglobin 9.2 (*)    HCT 30.9 (*)    MCHC 29.8 (*)    RDW 15.6 (*)    All other components within normal limits  URINALYSIS, ROUTINE W REFLEX MICROSCOPIC - Abnormal; Notable for the following components:   Protein, ur >=300 (*)    Bacteria, UA RARE (*)    All other components within normal limits  MAGNESIUM  PROTIME-INR  APTT    EKG EKG Interpretation  Date/Time:  Monday June 06 2018 22:39:17 EDT Ventricular Rate:  101 PR Interval:  QRS Duration: 86 QT Interval:  385 QTC Calculation: 500 R Axis:   68 Text Interpretation:  Sinus tachycardia Ventricular trigeminy LVH with secondary repolarization abnormality Borderline prolonged QT interval No acute changes TRIGEMINY IS NEW twi in the inferior and lateral leads is new Confirmed by Varney Biles [id number]) on 06/06/2018 11:33:34 PM   Radiology Dg Chest 2 View  Result Date: 06/06/2018 CLINICAL DATA:  Shortness of breath EXAM: CHEST - 2 VIEW COMPARISON:  11/17/2017, 09/24/2017 FINDINGS: Hyperinflation with emphysematous disease. Chronic pleural scarring at the left base. Chronic interstitial disease in the lower lobes. No acute opacity. Stable cardiomediastinal silhouette with aortic atherosclerosis. No pneumothorax. Carotid vascular calcification IMPRESSION: No active cardiopulmonary disease. Emphysematous disease with chronic interstitial changes at the bases and scarring at the left CP angle. Electronically Signed   By: Donavan Foil M.D.   On: 06/06/2018 22:25    Procedures Procedures (including critical care time)  Medications Ordered in ED Medications  furosemide (LASIX) injection 60 mg (has no administration in time range)     Initial Impression / Assessment and Plan / ED Course  I have reviewed the triage vital signs and the nursing notes.  Pertinent labs & imaging results that were available during my care of the patient were reviewed by me and considered in my  medical decision making (see chart for details).  Clinical Course as of Jul 23 0000  Mon Jun 06, 2018  2355 Likely due to chronic kidney disease and CHF.  No clinical concerns for active myocardial ischemia.  Patient does have new T wave inversions, therefore she will still benefit having cards follow-up -either inpatient or as an outpatient..  I spoke with Dr. Aundra Dubin, cardiology.  He would want medicine to consult cardiology if needed, for now he would like patient's troponins were trended only.  Troponin I(!!): 0.12 [AN]    Clinical Course User Index [AN] Varney Biles, MD    67 year old female with history of CAD, CKD, PAD, renal arterial stenosis and diastolic CHF comes in with chief complaint of shortness of breath.  She has orthopnea and paroxysmal nocturnal dyspnea along with exertional dyspnea.  Her EKG does have new T wave inversions in the inferior and lateral leads, raising concerns for ACS in addition to the CHF.  Lung exam is overall clear, there is no focal wheezing or rhonchi.  Chest x-ray is not showing any evidence of pneumonia.  BNP however is significantly elevated, there is mild troponin elevation as well Both of these values could be elevated because of patient's known ESRD -however with the new EKG changes -we will consult cardiology.  Patient is still making urine.  She will get IV 60 mg Lasix.  She has a peritoneal dialysis catheter in place, however she has not received dialysis in several weeks.  She might need further nephrology consultation as well while she is here.  Final Clinical Impressions(s) / ED Diagnoses   Final diagnoses:  Acute diastolic congestive heart failure [facility])    ED Discharge Orders    None       Varney Biles, MD 06/07/18 0002

## 2018-06-06 NOTE — ED Notes (Signed)
Pt. To XRAY via stretcher. 

## 2018-06-06 NOTE — ED Notes (Signed)
Patient ambulated to the bathroom with pulse oximetry on, sats dropped to 89%.  Patient complained of shortness of breath.

## 2018-06-07 ENCOUNTER — Inpatient Hospital Stay (HOSPITAL_COMMUNITY): Payer: Medicare HMO

## 2018-06-07 ENCOUNTER — Other Ambulatory Visit: Payer: Self-pay

## 2018-06-07 ENCOUNTER — Encounter (HOSPITAL_COMMUNITY): Payer: Self-pay

## 2018-06-07 DIAGNOSIS — Z79899 Other long term (current) drug therapy: Secondary | ICD-10-CM | POA: Diagnosis not present

## 2018-06-07 DIAGNOSIS — I2510 Atherosclerotic heart disease of native coronary artery without angina pectoris: Secondary | ICD-10-CM | POA: Diagnosis present

## 2018-06-07 DIAGNOSIS — I340 Nonrheumatic mitral (valve) insufficiency: Secondary | ICD-10-CM | POA: Diagnosis not present

## 2018-06-07 DIAGNOSIS — Z7982 Long term (current) use of aspirin: Secondary | ICD-10-CM | POA: Diagnosis not present

## 2018-06-07 DIAGNOSIS — E1121 Type 2 diabetes mellitus with diabetic nephropathy: Secondary | ICD-10-CM

## 2018-06-07 DIAGNOSIS — E1122 Type 2 diabetes mellitus with diabetic chronic kidney disease: Secondary | ICD-10-CM | POA: Diagnosis present

## 2018-06-07 DIAGNOSIS — Z7902 Long term (current) use of antithrombotics/antiplatelets: Secondary | ICD-10-CM | POA: Diagnosis not present

## 2018-06-07 DIAGNOSIS — R109 Unspecified abdominal pain: Secondary | ICD-10-CM | POA: Diagnosis present

## 2018-06-07 DIAGNOSIS — Z01810 Encounter for preprocedural cardiovascular examination: Secondary | ICD-10-CM | POA: Diagnosis not present

## 2018-06-07 DIAGNOSIS — I160 Hypertensive urgency: Secondary | ICD-10-CM | POA: Diagnosis present

## 2018-06-07 DIAGNOSIS — I161 Hypertensive emergency: Secondary | ICD-10-CM | POA: Diagnosis present

## 2018-06-07 DIAGNOSIS — Z85118 Personal history of other malignant neoplasm of bronchus and lung: Secondary | ICD-10-CM | POA: Diagnosis not present

## 2018-06-07 DIAGNOSIS — Z9114 Patient's other noncompliance with medication regimen: Secondary | ICD-10-CM | POA: Diagnosis not present

## 2018-06-07 DIAGNOSIS — E1129 Type 2 diabetes mellitus with other diabetic kidney complication: Secondary | ICD-10-CM | POA: Diagnosis present

## 2018-06-07 DIAGNOSIS — Z888 Allergy status to other drugs, medicaments and biological substances status: Secondary | ICD-10-CM | POA: Diagnosis not present

## 2018-06-07 DIAGNOSIS — I252 Old myocardial infarction: Secondary | ICD-10-CM | POA: Diagnosis not present

## 2018-06-07 DIAGNOSIS — N186 End stage renal disease: Secondary | ICD-10-CM | POA: Diagnosis present

## 2018-06-07 DIAGNOSIS — I5031 Acute diastolic (congestive) heart failure: Secondary | ICD-10-CM | POA: Diagnosis present

## 2018-06-07 DIAGNOSIS — I701 Atherosclerosis of renal artery: Secondary | ICD-10-CM | POA: Diagnosis present

## 2018-06-07 DIAGNOSIS — F1010 Alcohol abuse, uncomplicated: Secondary | ICD-10-CM | POA: Diagnosis present

## 2018-06-07 DIAGNOSIS — I132 Hypertensive heart and chronic kidney disease with heart failure and with stage 5 chronic kidney disease, or end stage renal disease: Secondary | ICD-10-CM | POA: Diagnosis present

## 2018-06-07 DIAGNOSIS — Z8673 Personal history of transient ischemic attack (TIA), and cerebral infarction without residual deficits: Secondary | ICD-10-CM | POA: Diagnosis not present

## 2018-06-07 DIAGNOSIS — D649 Anemia, unspecified: Secondary | ICD-10-CM | POA: Diagnosis present

## 2018-06-07 DIAGNOSIS — G8929 Other chronic pain: Secondary | ICD-10-CM | POA: Diagnosis present

## 2018-06-07 DIAGNOSIS — I509 Heart failure, unspecified: Secondary | ICD-10-CM | POA: Diagnosis not present

## 2018-06-07 DIAGNOSIS — J449 Chronic obstructive pulmonary disease, unspecified: Secondary | ICD-10-CM | POA: Diagnosis present

## 2018-06-07 DIAGNOSIS — F329 Major depressive disorder, single episode, unspecified: Secondary | ICD-10-CM | POA: Diagnosis present

## 2018-06-07 DIAGNOSIS — E7800 Pure hypercholesterolemia, unspecified: Secondary | ICD-10-CM | POA: Diagnosis present

## 2018-06-07 DIAGNOSIS — Z992 Dependence on renal dialysis: Secondary | ICD-10-CM | POA: Diagnosis present

## 2018-06-07 DIAGNOSIS — N185 Chronic kidney disease, stage 5: Secondary | ICD-10-CM | POA: Diagnosis not present

## 2018-06-07 DIAGNOSIS — Q600 Renal agenesis, unilateral: Secondary | ICD-10-CM | POA: Diagnosis not present

## 2018-06-07 DIAGNOSIS — J9601 Acute respiratory failure with hypoxia: Secondary | ICD-10-CM | POA: Diagnosis present

## 2018-06-07 LAB — COMPREHENSIVE METABOLIC PANEL
ALT: 9 U/L (ref 0–44)
ANION GAP: 11 (ref 5–15)
AST: 15 U/L (ref 15–41)
Albumin: 3.5 g/dL (ref 3.5–5.0)
Alkaline Phosphatase: 98 U/L (ref 38–126)
BUN: 39 mg/dL — ABNORMAL HIGH (ref 8–23)
CALCIUM: 9.2 mg/dL (ref 8.9–10.3)
CHLORIDE: 115 mmol/L — AB (ref 98–111)
CO2: 21 mmol/L — AB (ref 22–32)
Creatinine, Ser: 3.93 mg/dL — ABNORMAL HIGH (ref 0.44–1.00)
GFR calc non Af Amer: 11 mL/min — ABNORMAL LOW (ref >60)
GFR, EST AFRICAN AMERICAN: 13 mL/min — AB (ref >60)
Glucose, Bld: 119 mg/dL — ABNORMAL HIGH (ref 70–99)
Potassium: 3.6 mmol/L (ref 3.5–5.1)
SODIUM: 147 mmol/L — AB (ref 135–145)
Total Bilirubin: 0.5 mg/dL (ref 0.3–1.2)
Total Protein: 6.5 g/dL (ref 6.5–8.1)

## 2018-06-07 LAB — TSH: TSH: 0.632 u[IU]/mL (ref 0.350–4.500)

## 2018-06-07 LAB — CBC WITH DIFFERENTIAL/PLATELET
Abs Immature Granulocytes: 0 10*3/uL (ref 0.0–0.1)
BASOS ABS: 0.1 10*3/uL (ref 0.0–0.1)
BASOS PCT: 1 %
Eosinophils Absolute: 0.3 10*3/uL (ref 0.0–0.7)
Eosinophils Relative: 5 %
HCT: 32.8 % — ABNORMAL LOW (ref 36.0–46.0)
HEMOGLOBIN: 9.6 g/dL — AB (ref 12.0–15.0)
IMMATURE GRANULOCYTES: 0 %
Lymphocytes Relative: 19 %
Lymphs Abs: 1.3 10*3/uL (ref 0.7–4.0)
MCH: 28.1 pg (ref 26.0–34.0)
MCHC: 29.3 g/dL — ABNORMAL LOW (ref 30.0–36.0)
MCV: 95.9 fL (ref 78.0–100.0)
MONO ABS: 0.4 10*3/uL (ref 0.1–1.0)
Monocytes Relative: 6 %
NEUTROS ABS: 4.7 10*3/uL (ref 1.7–7.7)
NEUTROS PCT: 69 %
PLATELETS: 242 10*3/uL (ref 150–400)
RBC: 3.42 MIL/uL — AB (ref 3.87–5.11)
RDW: 15.7 % — ABNORMAL HIGH (ref 11.5–15.5)
WBC: 6.8 10*3/uL (ref 4.0–10.5)

## 2018-06-07 LAB — GLUCOSE, CAPILLARY
GLUCOSE-CAPILLARY: 116 mg/dL — AB (ref 70–99)
GLUCOSE-CAPILLARY: 105 mg/dL — AB (ref 70–99)
Glucose-Capillary: 104 mg/dL — ABNORMAL HIGH (ref 70–99)
Glucose-Capillary: 101 mg/dL — ABNORMAL HIGH (ref 70–99)

## 2018-06-07 LAB — TROPONIN I
TROPONIN I: 0.1 ng/mL — AB (ref <0.03)
TROPONIN I: 0.09 ng/mL — AB (ref <0.03)
Troponin I: 0.12 ng/mL (ref <0.03)

## 2018-06-07 LAB — VITAMIN B12: Vitamin B-12: 366 pg/mL (ref 180–914)

## 2018-06-07 LAB — ECHOCARDIOGRAM COMPLETE
HEIGHTINCHES: 64 in
WEIGHTICAEL: 2179.2 [oz_av]

## 2018-06-07 LAB — FOLATE: FOLATE: 9.7 ng/mL (ref >5.9)

## 2018-06-07 LAB — IRON AND TIBC
Iron: 31 ug/dL (ref 28–170)
Saturation Ratios: 11 % (ref 10.4–31.8)
TIBC: 291 ug/dL (ref 250–450)
UIBC: 260 ug/dL

## 2018-06-07 LAB — HIV ANTIBODY (ROUTINE TESTING W REFLEX): HIV Screen 4th Generation wRfx: NONREACTIVE

## 2018-06-07 LAB — FERRITIN: Ferritin: 98 ng/mL (ref 11–307)

## 2018-06-07 MED ORDER — HYDRALAZINE HCL 50 MG PO TABS
50.0000 mg | ORAL_TABLET | Freq: Three times a day (TID) | ORAL | Status: DC
  Administered 2018-06-07 – 2018-06-09 (×8): 50 mg via ORAL
  Filled 2018-06-07 (×11): qty 1

## 2018-06-07 MED ORDER — ISOSORBIDE MONONITRATE ER 30 MG PO TB24
30.0000 mg | ORAL_TABLET | Freq: Every day | ORAL | Status: DC
  Administered 2018-06-07 – 2018-06-10 (×4): 30 mg via ORAL
  Filled 2018-06-07 (×4): qty 1

## 2018-06-07 MED ORDER — INSULIN ASPART 100 UNIT/ML ~~LOC~~ SOLN: 0.0000 [IU] | Freq: Three times a day (TID) | SUBCUTANEOUS | Status: DC

## 2018-06-07 MED ORDER — FUROSEMIDE 10 MG/ML IJ SOLN
40.0000 mg | Freq: Two times a day (BID) | INTRAMUSCULAR | Status: DC
Start: 2018-06-07 — End: 2018-06-09
  Administered 2018-06-07 – 2018-06-09 (×5): 40 mg via INTRAVENOUS
  Filled 2018-06-07 (×6): qty 4

## 2018-06-07 MED ORDER — ASPIRIN 81 MG PO CHEW
324.0000 mg | CHEWABLE_TABLET | Freq: Once | ORAL | Status: AC
  Administered 2018-06-07: 324 mg via ORAL
  Filled 2018-06-07: qty 4

## 2018-06-07 MED ORDER — HYDRALAZINE HCL 20 MG/ML IJ SOLN: 5.0000 mg | INTRAMUSCULAR | Status: DC | PRN

## 2018-06-07 MED ORDER — METOPROLOL SUCCINATE ER 50 MG PO TB24
50.0000 mg | ORAL_TABLET | Freq: Every day | ORAL | Status: DC
  Administered 2018-06-07 – 2018-06-10 (×4): 50 mg via ORAL
  Filled 2018-06-07 (×5): qty 1

## 2018-06-07 MED ORDER — TRAMADOL HCL 50 MG PO TABS
50.0000 mg | ORAL_TABLET | Freq: Four times a day (QID) | ORAL | Status: DC | PRN
  Administered 2018-06-08 – 2018-06-09 (×6): 50 mg via ORAL
  Filled 2018-06-07 (×7): qty 1

## 2018-06-07 MED ORDER — ASPIRIN EC 81 MG PO TBEC
81.0000 mg | DELAYED_RELEASE_TABLET | Freq: Every day | ORAL | Status: DC
  Administered 2018-06-07 – 2018-06-10 (×4): 81 mg via ORAL
  Filled 2018-06-07 (×4): qty 1

## 2018-06-07 MED ORDER — ACETAMINOPHEN 325 MG PO TABS: 650.0000 mg | ORAL_TABLET | Freq: Four times a day (QID) | ORAL | Status: DC | PRN

## 2018-06-07 MED ORDER — CLOPIDOGREL BISULFATE 75 MG PO TABS
75.0000 mg | ORAL_TABLET | Freq: Every day | ORAL | Status: DC
  Administered 2018-06-07 – 2018-06-10 (×4): 75 mg via ORAL
  Filled 2018-06-07 (×4): qty 1

## 2018-06-07 MED ORDER — ALPRAZOLAM 0.25 MG PO TABS
0.2500 mg | ORAL_TABLET | Freq: Three times a day (TID) | ORAL | Status: DC | PRN
  Administered 2018-06-10 (×2): 0.25 mg via ORAL
  Filled 2018-06-07 (×2): qty 1

## 2018-06-07 MED ORDER — BUPROPION HCL ER (SR) 150 MG PO TB12
150.0000 mg | ORAL_TABLET | Freq: Two times a day (BID) | ORAL | Status: DC
  Administered 2018-06-07 – 2018-06-08 (×4): 150 mg via ORAL
  Filled 2018-06-07 (×4): qty 1

## 2018-06-07 MED ORDER — ATORVASTATIN CALCIUM 40 MG PO TABS
40.0000 mg | ORAL_TABLET | Freq: Every day | ORAL | Status: DC
  Administered 2018-06-07 – 2018-06-10 (×4): 40 mg via ORAL
  Filled 2018-06-07 (×4): qty 1

## 2018-06-07 MED ORDER — ONDANSETRON HCL 4 MG/2ML IJ SOLN: 4.0000 mg | Freq: Four times a day (QID) | INTRAMUSCULAR | Status: DC | PRN

## 2018-06-07 MED ORDER — HEPARIN SODIUM (PORCINE) 5000 UNIT/ML IJ SOLN
5000.0000 [IU] | Freq: Three times a day (TID) | INTRAMUSCULAR | Status: DC
  Administered 2018-06-07 – 2018-06-10 (×8): 5000 [IU] via SUBCUTANEOUS
  Filled 2018-06-07 (×9): qty 1

## 2018-06-07 MED ORDER — AMLODIPINE BESYLATE 10 MG PO TABS
10.0000 mg | ORAL_TABLET | Freq: Every day | ORAL | Status: DC
  Administered 2018-06-07 – 2018-06-10 (×4): 10 mg via ORAL
  Filled 2018-06-07 (×4): qty 1

## 2018-06-07 MED ORDER — ONDANSETRON HCL 4 MG PO TABS: 4.0000 mg | ORAL_TABLET | Freq: Four times a day (QID) | ORAL | Status: DC | PRN

## 2018-06-07 MED ORDER — ACETAMINOPHEN 650 MG RE SUPP: 650.0000 mg | Freq: Four times a day (QID) | RECTAL | Status: DC | PRN

## 2018-06-07 NOTE — ED Notes (Signed)
Bedside commode placed in room for pt.

## 2018-06-07 NOTE — Progress Notes (Signed)
  Echocardiogram 2D Echocardiogram has been performed.  Kari Robinson 06/07/2018, 3:11 PM

## 2018-06-07 NOTE — Progress Notes (Signed)
Request for PD cath removal.  D/W Dr. Excell Seltzer.  Will defer excision until she is an outpatient as this is not urgent.  She needs to be medically tuned up prior to general anesthesia.  Once her acute CHF, HTN is better controlled, she can certainly follow up as an outpatient for removal.  Kari Robinson 4:45 PM 06/07/2018

## 2018-06-07 NOTE — Progress Notes (Signed)
Patient had 3 wide QRS, she was asymptomatic when I checked on her. Will continue to monitor.

## 2018-06-07 NOTE — Progress Notes (Signed)
Patient Bp was 170/132, MD notified. Will continue to monitor.

## 2018-06-07 NOTE — Progress Notes (Signed)
Received pt on the floor from ED RN, pt ambulated to the bathroom where she came having SOB, pulse oxymetry was 98% but pt was still having SOB 2L o2 given. Pt is in bed with no SOB at the time.

## 2018-06-07 NOTE — Consult Note (Addendum)
[facility] CONSULT NOTE    Date: 06/07/2018                  Patient Name:  Kari Robinson  MRN: [id number]  DOB: [birth year]  Age / Sex: 67 y.o., female         PCP: Concepcion Elk, MD                 Service Requesting Consult: Dr. Florene Glen                  Reason for Consult: BP management             History of Present Illness: Patient is a 67 y.o. 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 Her blood pressure on admission was 156/146 but continued to increase up to 195/146. She takes amlodipine 10 mg QD, hydralazine 50 mg TID, Imdur 30 mg QD, metoprolol 50 mg QD, and torsemide 20 mg QD. These medications have been resumed with the exception of torsemide and her most recent BP is 152/113. She is getting IV Lasix 40 mg BID. Of note, she was started on PD in 01/2018 but stopped in 03/2018 due pain at site of catheter and feeling overwhelmed about flushing the PD catheter and going to trainings. She follows up with Dr. Olivia Mackie [facility]) but would like to follow up with nephrology in [place]. It seems there were ongoing conversations about starting HD soon. She still has PD catheter in her abdomen, missed appt with surgery to schedule removal.   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  BP is high and will need better control   Medications: Outpatient medications: Medications Prior to Admission  Medication Sig Dispense Refill Last Dose  . ALPRAZolam (XANAX) 0.25 MG tablet Take 0.25 mg by mouth 3 (three) times daily as needed for sleep.  0  06/06/2018 at Unknown time  . amLODipine (NORVASC) 10 MG tablet Take 10 mg by mouth daily.   Past Month at Unknown time  . aspirin 81 MG tablet Take 81 mg by mouth daily.   Past Month at Unknown time  . atorvastatin (LIPITOR) 40 MG tablet Take 40 mg by mouth daily.   Past Month at Unknown time  . buPROPion (WELLBUTRIN SR) 150 MG 12 hr tablet Take 150 mg by mouth 2 (two) times daily.  1 Past Month at Unknown time  . Cholecalciferol (VITAMIN D3) 1000 UNITS CAPS Take 1,000 Units by mouth 2 (two) times daily.   Past Month at Unknown time  . clopidogrel (PLAVIX) 75 MG tablet Take 75 mg by mouth daily.   Past Month at Unknown time  . hydrALAZINE (APRESOLINE) 50 MG tablet Take 50 mg by mouth 3 (three) times daily.   Past Month at Unknown time  . isosorbide mononitrate (IMDUR) 30 MG 24 hr tablet Take 30 mg by mouth daily.   Past Month at Unknown time  . metoprolol succinate (TOPROL-XL) 50 MG 24 hr tablet Take 50 mg by mouth daily. Take with or immediately following a meal.   Past Month at  Unknown time  . torsemide (DEMADEX) 20 MG tablet Take 20 mg by mouth daily.   Past Month at Unknown time  . traMADol (ULTRAM) 50 MG tablet Take 50 mg by mouth every 6 (six) hours as needed for pain. for pain  0 06/05/2018 at Unknown time    Current medications: Current Facility-Administered Medications  Medication Dose Route Frequency Provider Last Rate Last Dose  . acetaminophen (TYLENOL) tablet 650 mg  650 mg Oral Q6H PRN Rise Patience, MD       Or  . acetaminophen (TYLENOL) suppository 650 mg  650 mg Rectal Q6H PRN Rise Patience, MD      . ALPRAZolam Duanne Moron) tablet 0.25 mg  0.25 mg Oral TID PRN Rise Patience, MD      . amLODipine (NORVASC) tablet 10 mg  10 mg Oral Daily Rise Patience, MD   10 mg at 06/07/18 0820  . aspirin EC tablet 81 mg  81 mg Oral Daily Rise Patience, MD   81 mg at 06/07/18 0820  . atorvastatin (LIPITOR) tablet 40 mg  40 mg Oral Daily Rise Patience, MD    40 mg at 06/07/18 2683  . buPROPion [facility] SR) 12 hr tablet 150 mg  150 mg Oral BID WC Rise Patience, MD   150 mg at 06/07/18 0820  . clopidogrel (PLAVIX) tablet 75 mg  75 mg Oral Daily Rise Patience, MD   75 mg at 06/07/18 0820  . furosemide (LASIX) injection 40 mg  40 mg Intravenous BID Rise Patience, MD   40 mg at 06/07/18 4196  . heparin injection 5,000 Units  5,000 Units Subcutaneous Q8H Elodia Florence., MD   5,000 Units at 06/07/18 [phone]  . hydrALAZINE (APRESOLINE) injection 5 mg  5 mg Intravenous Q4H PRN Rise Patience, MD      . hydrALAZINE (APRESOLINE) tablet 50 mg  50 mg Oral Q8H Rise Patience, MD   50 mg at 06/07/18 7989  . insulin aspart (novoLOG) injection 0-9 Units  0-9 Units Subcutaneous TID WC Rise Patience, MD      . isosorbide mononitrate (IMDUR) 24 hr tablet 30 mg  30 mg Oral Daily Rise Patience, MD   30 mg at 06/07/18 0820  . metoprolol succinate (TOPROL-XL) 24 hr tablet 50 mg  50 mg Oral Daily Rise Patience, MD   50 mg at 06/07/18 2119  . ondansetron (ZOFRAN) tablet 4 mg  4 mg Oral Q6H PRN Rise Patience, MD       Or  . ondansetron [facility]) injection 4 mg  4 mg Intravenous Q6H PRN Rise Patience, MD      . traMADol Veatrice Bourbon) tablet 50 mg  50 mg Oral Q6H PRN Rise Patience, MD          Allergies: Allergies  Allergen Reactions  . Citalopram Hydrobromide Other (See Comments)    Unknown      Past Medical History: Past Medical History:  Diagnosis Date  . Alcohol abuse   . Arthralgia   . CAD (coronary artery disease)    moderate primarily septal perforater  . Depression   . HTN (hypertension)   . Hypercholesterolemia    primarily ldl-p and small particles  . RAS (renal artery stenosis) ([place]) 2002   by cath tysinger  . Smoking      Past Surgical History: Past Surgical History:  Procedure Laterality Date  . abdominal aortogram  perclose of the right femoral artery  .  CARDIAC CATHETERIZATION     left heart catheterization.  Coronary cineangiography. Lft ventricular cineangiography.    . TUBAL LIGATION       Family History: Family History  Problem Relation Age of Onset  . Emphysema Father      Social History: Social History   Socioeconomic History  . Marital status: Single    Spouse name: Not on file  . Number of children: Not on file  . Years of education: Not on file  . Highest education level: Not on file  Occupational History  . Not on file  Social Needs  . Financial resource strain: Not on file  . Food insecurity:    Worry: Not on file    Inability: Not on file  . Transportation needs:    Medical: Not on file    Non-medical: Not on file  Tobacco Use  . Smoking status: Current Every Day Smoker    Packs/day: 2.00    Years: 35.00    Pack years: 70.00    Types: Cigarettes  . Smokeless tobacco: Never Used  Substance and Sexual Activity  . Alcohol use: Yes    Comment: Drinks 3 beers a day and one case of beer over the weekend  . Drug use: Not on file  . Sexual activity: Not on file  Lifestyle  . Physical activity:    Days per week: Not on file    Minutes per session: Not on file  . Stress: Not on file  Relationships  . Social connections:    Talks on phone: Not on file    Gets together: Not on file    Attends religious service: Not on file    Active member of club or organization: Not on file    Attends meetings of clubs or organizations: Not on file    Relationship status: Not on file  . Intimate partner violence:    Fear of current or ex partner: Not on file    Emotionally abused: Not on file    Physically abused: Not on file    Forced sexual activity: Not on file  Other Topics Concern  . Not on file  Social History Narrative  . Not on file     Review of Systems: As per HPI  Vital Signs: Blood pressure (!) 152/113, pulse 62, temperature 97.8 F (36.6 C), temperature source Oral, resp. rate 18, height 5\' 4"   (1.626 m), weight 136 lb 3.2 oz (61.8 kg), SpO2 100 %.  Weight trends: Filed Weights   06/06/18 2053 06/07/18 0324  Weight: 147 lb (66.7 kg) 136 lb 3.2 oz (61.8 kg)    Physical Exam: General: Vital signs reviewed and noted. Chronically-ill appearing female sleeping in bed in no acute distress.  Head: Normocephalic, atraumatic.  Eyes: PERRL, EOMI, No signs of anemia or jaundince.  Nose: Mucous membranes moist, not inflammed, nonerythematous.  Throat: Oropharynx nonerythematous, no exudate appreciated.   Neck: No deformities, masses, or tenderness noted.Supple, No carotid Bruits, no JVD.  Lungs:  Normal respiratory effort. Clear to auscultation BL without crackles or wheezes.  Heart: RRR. S1 and S2 normal without gallop, murmur, or rubs.  Abdomen:  BS normoactive. Soft, Nondistended, non-tender.  No masses or organomegaly. PD catheter on the L with no signs of infection noted.   Extremities: No pretibial edema bilaterally.  Neurologic: A&O X3, no focal deficits noted.  Skin: No visible rashes, scars.    Lab results: Basic Metabolic Panel:  Recent Labs  Lab 06/06/18 03-Feb-2235 06/07/18 0316  NA 146* 147*  K 4.3 3.6  CL 118* 115*  CO2 18* 21*  GLUCOSE 101* 119*  BUN 39* 39*  CREATININE 3.85* 3.93*  CALCIUM 9.1 9.2  MG 2.0  --     Liver Function Tests: Recent Labs  Lab 06/07/18 0316  AST 15  ALT 9  ALKPHOS 98  BILITOT 0.5  PROT 6.5  ALBUMIN 3.5   No results for input(s): LIPASE, AMYLASE in the last 168 hours. No results for input(s): AMMONIA in the last 168 hours.  CBC: Recent Labs  Lab 06/06/18 Feb 03, 2235 06/07/18 0316  WBC 6.9 6.8  NEUTROABS 4.9 4.7  HGB 9.2* 9.6*  HCT 30.9* 32.8*  MCV 97.2 95.9  PLT 220 242    Cardiac Enzymes: Recent Labs  Lab 06/06/18 2236 06/07/18 0316  TROPONINI 0.12* 0.12*    BNP: Invalid input(s): POCBNP  CBG: Recent Labs  Lab 06/07/18 0756  GLUCAP 104*    Microbiology: No results found for this or any previous  visit.  Coagulation Studies: Recent Labs    06/06/18 2235/02/03  LABPROT 14.2  INR 1.11    Urinalysis: Recent Labs    06/06/18 2340  COLORURINE YELLOW  LABSPEC 1.013  PHURINE 5.0  GLUCOSEU NEGATIVE  HGBUR NEGATIVE  BILIRUBINUR NEGATIVE  KETONESUR NEGATIVE  PROTEINUR >=300*  NITRITE NEGATIVE  LEUKOCYTESUR NEGATIVE      Imaging: Dg Chest 2 View  Result Date: 06/06/2018 CLINICAL DATA:  Shortness of breath EXAM: CHEST - 2 VIEW COMPARISON:  11/17/2017, 09/24/2017 FINDINGS: Hyperinflation with emphysematous disease. Chronic pleural scarring at the left base. Chronic interstitial disease in the lower lobes. No acute opacity. Stable cardiomediastinal silhouette with aortic atherosclerosis. No pneumothorax. Carotid vascular calcification IMPRESSION: No active cardiopulmonary disease. Emphysematous disease with chronic interstitial changes at the bases and scarring at the left CP angle. Electronically Signed   By: Donavan Foil M.D.   On: 06/06/2018 22:25      Assessment & Plan: Pt is a 67 y.o. yo female with a PMHX of KD stage V recently on PD (stopped 03/2018 after improvement in renal function), HTN, CAD s/p PCI, and T2DM off medications who presented with acute hypoxic respiratory in the setting of hypertensive emergency. Recently stopped PD 5/19, not on HD.  ` 1. CKD Stage V: Per documentation from Feb 03, 2011 she has fibromuscular dysplasia and bilateral RAS but has a history of congenitally absent L kidney. Baseline Cr 3.5-3.7 but most recently 4.17 prior to admission. Cr 3.85 on admission -> 3.93 today. Recently stopped PD due pain at cathter site and inability to keep up with training due to memory problems. Follows up with Dr. Olivia Mackie in [facility] but trying to establish in [place]. Discussed benefits vs risks of HD and patient is interested in starting HD. Will need removal of PD catheter, HD temp cath, vein mapping, and VVS consult. Continue stric I/Os, daily weights, monitoring of  renal function and electrolytes.   Agree    Would consult  Surgery for removal of PD catheter     No urgent needs for dialysis    Check vein mapping   2. HTN: currently on amlodipine 10, hydralazine 50mg  TID, Imdur 30 mg and metop XL 50 mg QD. Would not make further adjustments in medications today as the goal is to reduce BP by 25% in the 1st 24 hours and to avoid ischemic complications.  Can consider increasing hydralazine to 75 mg TID or metop to 50  BID (HR in the 100s and can tolerate it) if remains hypertensive tomorrow. HD will also help with BP if we are abel to start it while she is here.  She could try ACE inhibitor tomorrow if she remains hypertensive  She did not take BP medications x 1 week  3. Volume: appears euvolemic on exam. Getting IV Lasix 40 mg Bid with minimal UOP 300cc, but unsure if this is accurate. Weight 136 lbs today from 147 yesterday in the ED but this is likely a reported weight. Continue strict I/Os and daily weights.   Agree with lasix and will check 2 D Echo  4. Anemia: Hgb 9.6 with baseline at 10-11. Checking folate, B12, and iron panel.   5. CAD s/p PCI: Presented with troponinemia and TWI in lateral leads. No active chest pain at this time. On metoprolol. Repeating EKG and TTE ordered.   6. T2DM:  Per primary.   Thank you for interesting consult  Please call for questions Edrick Oh MD FACP  [phone]

## 2018-06-07 NOTE — Progress Notes (Signed)
PROGRESS NOTE    Kari Robinson  UXL:[id number] DOB: 06/12/1951 DOA: 06/06/2018 PCP: Concepcion Elk, MD   Brief Narrative:  Kari Robinson is Kari Robinson 67 y.o. female with history of hypertension, chronic kidney disease stage V who was recently on peritoneal dialysis for about 3 or 4 sessions discontinued in May 2019 3 months ago after patient was not able to tolerate and also kidney function improved, CAD status post stenting and diabetes mellitus type 2 off medications presents to the ER because of Kari Robinson worsening shortness of breath last 2 days.  Patient states she has been getting short of breath on exertion and lying down and has not taken her antihypertensives for the last 1 week since she lost faith in it.  Denies any chest pain productive cough fever or chills.  Has been making urine.  Assessment & Plan:   Principal Problem:   Hypertensive emergency Active Problems:   Acute CHF (congestive heart failure) ([name])   CKD (chronic kidney disease) stage 5, GFR less than 15 ml/min ([name])   Normocytic normochromic anemia   DM (diabetes mellitus), type 2 with renal complications ([name])   Hypertensive urgency   1. Acute respiratory failure with hypoxia  Hypertensive Emergency: in the setting of markedly elevated blood pressure likely from acute CHF.  ? Progressive CKD.  Patient has not taken her antihypertensives for last 1 week.  She notes her symptoms have improved since receiving treatment last night   1. Restart home meds (hydralazine, metop, amlodipine, imdur).  Prn hydral   2. Follow echo 3. Trend troponins 4. Lasix IV 40 BID 5. Wean O2 as tolerated 6. I/O, daily weights  2. Chronic Kidney Disease:  Unclear baseline creatinine, creatinine in 04/04/18 was as high as 4.17.  Creatinine 3.93 from 3.85 at admission.  She'd been started on peritoneal dialysis in May, but no longer getting this, sounds like plan to discuss HD. 1. Will consult nephrology in setting of above  3. Elevated troponin:  Denies any  chest pain.  Troponins elevated, but flat in the setting of CKD.  EKG with T wave inversions in II, III, aVF and V5, V6 (no recent EKG's, but EKG from 2013 with similar findings in V5, V6.  Will repeat EKG today and follow echo.  Consider cards c/s as needed.      4. Normocytic normochromic anemia likely from ESRD follow CBC.  Follow B12, folate, iron panel.    5. Diabetes mellitus type 2 off medications.  AM BG appropriate.  6. History of CAD - ASA, plavix, imdur, metoprolol, lipitor.  Follow echo as above.    7. Chronic abdominal pain after tunneled catheter placed.  The PD catheter is still in place.  No significant pain on my exam.  Site appears clean and dry without erythema.   DVT prophylaxis: heparin Code Status: full  Family Communication: none at bedside Disposition Plan: pending improvement   Consultants:   nephrology  Procedures:  none  Antimicrobials:   none    Subjective: Feeling better.  No chest pain.  PD started in may, then stopped.  Appt with surgeon tomorrow.   Objective: Vitals:   06/06/18 2238 06/06/18 2245 06/07/18 0131 06/07/18 0324  BP: (!) 195/146 (!) 179/97 (!) 170/132 (!) 152/113  Pulse: (!) 101 98 (!) 112 62  Resp: (!) 25 (!) 21 (!) 21 18  Temp:   99.1 F (37.3 C) 97.8 F (36.6 C)  TempSrc:   Oral Oral  SpO2: 96% 95% 99% 100%  Weight:  61.8 kg (136 lb 3.2 oz)  Height:        Intake/Output Summary (Last 24 hours) at 06/07/2018 0919 Last data filed at 06/07/2018 0820 Gross per 24 hour  Intake 240 ml  Output 300 ml  Net -60 ml   Filed Weights   06/06/18 2053 06/07/18 0324  Weight: 66.7 kg (147 lb) 61.8 kg (136 lb 3.2 oz)    Examination:  General exam: Appears calm and comfortable  Respiratory system: Clear to auscultation. Respiratory effort normal. Cardiovascular system: S1 & S2 heard, RRR. No JVD, murmurs, rubs, gallops or clicks. No pedal edema. Gastrointestinal system: Abdomen is nondistended, soft and nontender.  L PD  catheter.   Central nervous system: Alert and oriented. No focal neurological deficits. Extremities: Symmetric 5 x 5 power. Skin: No rashes, lesions or ulcers Psychiatry: Judgement and insight appear normal. Mood & affect appropriate.     Data Reviewed: I have personally reviewed following labs and imaging studies  CBC: Recent Labs  Lab 06/06/18 2236 06/07/18 0316  WBC 6.9 6.8  NEUTROABS 4.9 4.7  HGB 9.2* 9.6*  HCT 30.9* 32.8*  MCV 97.2 95.9  PLT 220 938   Basic Metabolic Panel: Recent Labs  Lab 06/06/18 2236 06/07/18 0316  NA 146* 147*  K 4.3 3.6  CL 118* 115*  CO2 18* 21*  GLUCOSE 101* 119*  BUN 39* 39*  CREATININE 3.85* 3.93*  CALCIUM 9.1 9.2  MG 2.0  --    GFR: Estimated Creatinine Clearance: 12 mL/min (Kari Robinson) (by C-G formula based on SCr of 3.93 mg/dL (H)). Liver Function Tests: Recent Labs  Lab 06/07/18 0316  AST 15  ALT 9  ALKPHOS 98  BILITOT 0.5  PROT 6.5  ALBUMIN 3.5   No results for input(s): LIPASE, AMYLASE in the last 168 hours. No results for input(s): AMMONIA in the last 168 hours. Coagulation Profile: Recent Labs  Lab 06/06/18 2236  INR 1.11   Cardiac Enzymes: Recent Labs  Lab 06/06/18 2236 06/07/18 0316  TROPONINI 0.12* 0.12*   BNP (last 3 results) No results for input(s): PROBNP in the last 8760 hours. HbA1C: No results for input(s): HGBA1C in the last 72 hours. CBG: Recent Labs  Lab 06/07/18 0756  GLUCAP 104*   Lipid Profile: No results for input(s): CHOL, HDL, LDLCALC, TRIG, CHOLHDL, LDLDIRECT in the last 72 hours. Thyroid Function Tests: Recent Labs    06/07/18 0316  TSH 0.632   Anemia Panel: No results for input(s): VITAMINB12, FOLATE, FERRITIN, TIBC, IRON, RETICCTPCT in the last 72 hours. Sepsis Labs: No results for input(s): PROCALCITON, LATICACIDVEN in the last 168 hours.  No results found for this or any previous visit (from the past 240 hour(s)).       Radiology Studies: Dg Chest 2 View  Result  Date: 06/06/2018 CLINICAL DATA:  Shortness of breath EXAM: CHEST - 2 VIEW COMPARISON:  11/17/2017, 09/24/2017 FINDINGS: Hyperinflation with emphysematous disease. Chronic pleural scarring at the left base. Chronic interstitial disease in the lower lobes. No acute opacity. Stable cardiomediastinal silhouette with aortic atherosclerosis. No pneumothorax. Carotid vascular calcification IMPRESSION: No active cardiopulmonary disease. Emphysematous disease with chronic interstitial changes at the bases and scarring at the left CP angle. Electronically Signed   By: Donavan Foil M.D.   On: 06/06/2018 22:25        Scheduled Meds: . amLODipine  10 mg Oral Daily  . aspirin EC  81 mg Oral Daily  . atorvastatin  40 mg Oral Daily  . buPROPion  150 mg Oral BID WC  . clopidogrel  75 mg Oral Daily  . furosemide  40 mg Intravenous BID  . hydrALAZINE  50 mg Oral Q8H  . insulin aspart  0-9 Units Subcutaneous TID WC  . isosorbide mononitrate  30 mg Oral Daily  . metoprolol succinate  50 mg Oral Daily   Continuous Infusions:   LOS: 0 days    Time spent: over 30 min    Fayrene Helper, MD [facility] Pager [phone]  If 7PM-7AM, please contact night-coverage [email or web address] Password [facility] 06/07/2018, 9:19 AM

## 2018-06-07 NOTE — H&P (Signed)
History and Physical    Kari Robinson DQQ:[id number] DOB: Jun 15, 1951 DOA: 06/06/2018  PCP: Concepcion Elk, MD  Patient coming from: Home.  Chief Complaint: Shortness of breath.  HPI: Kari Robinson is a 67 y.o. female with history of hypertension, chronic kidney disease stage V who was recently on peritoneal dialysis for about 3 or 4 sessions discontinued in May 2019 3 months ago after patient was not able to tolerate and also kidney function improved, CAD status post stenting and diabetes mellitus type 2 off medications presents to the ER because of a worsening shortness of breath last 2 days.  Patient states she has been getting short of breath on exertion and lying down and has not taken her antihypertensives for the last 1 week since she lost faith in it.  Denies any chest pain productive cough fever or chills.  Has been making urine.  ED Course: In the ER patient blood pressure was markedly elevated.  Chest x-ray showed nothing acute.  Given the symptoms patient was given Lasix 60 mg IV following which patient symptoms improved and has been having good urine output.  EKG shows sinus rhythm with LVH.  Troponin mildly elevated BNP markedly elevated.  Review of Systems: As per HPI, rest all negative.   Past Medical History:  Diagnosis Date  . Alcohol abuse   . Arthralgia   . CAD (coronary artery disease)    moderate primarily septal perforater  . Depression   . HTN (hypertension)   . Hypercholesterolemia    primarily ldl-p and small particles  . RAS (renal artery stenosis) ([place]) 2002   by cath tysinger  . Smoking     Past Surgical History:  Procedure Laterality Date  . abdominal aortogram     perclose of the right femoral artery  . CARDIAC CATHETERIZATION     left heart catheterization.  Coronary cineangiography. Lft ventricular cineangiography.    . TUBAL LIGATION       reports that she has been smoking cigarettes.  She has a 70.00 pack-year smoking history. She has never used  smokeless tobacco. She reports that she drinks alcohol. Her drug history is not on file.  Allergies  Allergen Reactions  . Citalopram Hydrobromide Other (See Comments)    Unknown    Family History  Problem Relation Age of Onset  . Emphysema Father     Prior to Admission medications   Medication Sig Start Date End Date Taking? Authorizing Provider  ALPRAZolam (XANAX) 0.25 MG tablet Take 0.25 mg by mouth 3 (three) times daily as needed for sleep. 04/28/18  Yes [provider]  amLODipine (NORVASC) 10 MG tablet Take 10 mg by mouth daily.   Yes [provider]  aspirin 81 MG tablet Take 81 mg by mouth daily.   Yes [provider]  atorvastatin (LIPITOR) 40 MG tablet Take 40 mg by mouth daily.   Yes [provider]  buPROPion (WELLBUTRIN SR) 150 MG 12 hr tablet Take 150 mg by mouth 2 (two) times daily. 03/17/18  Yes [provider]  Cholecalciferol (VITAMIN D3) 1000 UNITS CAPS Take 1,000 Units by mouth 2 (two) times daily.   Yes [provider]  clopidogrel (PLAVIX) 75 MG tablet Take 75 mg by mouth daily.   Yes [provider]  hydrALAZINE (APRESOLINE) 50 MG tablet Take 50 mg by mouth 3 (three) times daily.   Yes [provider]  isosorbide mononitrate (IMDUR) 30 MG 24 hr tablet Take 30 mg by mouth daily.  Yes [provider]  metoprolol succinate (TOPROL-XL) 50 MG 24 hr tablet Take 50 mg by mouth daily. Take with or immediately following a meal.   Yes [provider]  torsemide (DEMADEX) 20 MG tablet Take 20 mg by mouth daily.   Yes [provider]  traMADol (ULTRAM) 50 MG tablet Take 50 mg by mouth every 6 (six) hours as needed for pain. for pain 04/26/18  Yes [provider]    Physical Exam: Vitals:   06/06/18 2101 06/06/18 2238 06/06/18 2245 06/07/18 0131  BP: (!) 156/132 (!) 195/146 (!) 179/97 (!) 170/132  Pulse: 95 (!) 101 98 (!) 112  Resp: 18 (!) 25 (!) 21 (!) 21  Temp:     99.1 F (37.3 C)  TempSrc:    Oral  SpO2: 99% 96% 95% 99%  Weight:      Height:          Constitutional: Moderately built and nourished. Vitals:   06/06/18 2101 06/06/18 2238 06/06/18 2245 06/07/18 0131  BP: (!) 156/132 (!) 195/146 (!) 179/97 (!) 170/132  Pulse: 95 (!) 101 98 (!) 112  Resp: 18 (!) 25 (!) 21 (!) 21  Temp:    99.1 F (37.3 C)  TempSrc:    Oral  SpO2: 99% 96% 95% 99%  Weight:      Height:       Eyes: Anicteric no pallor. ENMT: No discharge from the ears eyes nose or mouth. Neck: No mass or.  No neck rigidity.  No JVD appreciated. Respiratory: No rhonchi or crepitations. Cardiovascular: S1-S2 heard no murmurs appreciated. Abdomen: Soft nontender bowel sounds present. Musculoskeletal: No edema.  No joint effusion. Skin: No rash.  Skin appears warm. Neurologic: Alert awake oriented to time place and person.  Moves all extremities. Psychiatric: Appears normal per normal affect.   Labs on Admission: I have personally reviewed following labs and imaging studies  CBC: Recent Labs  Lab 06/06/18 2236  WBC 6.9  NEUTROABS 4.9  HGB 9.2*  HCT 30.9*  MCV 97.2  PLT 494   Basic Metabolic Panel: Recent Labs  Lab 06/06/18 2236  NA 146*  K 4.3  CL 118*  CO2 18*  GLUCOSE 101*  BUN 39*  CREATININE 3.85*  CALCIUM 9.1  MG 2.0   GFR: Estimated Creatinine Clearance: 13.3 mL/min (A) (by C-G formula based on SCr of 3.85 mg/dL (H)). Liver Function Tests: No results for input(s): AST, ALT, ALKPHOS, BILITOT, PROT, ALBUMIN in the last 168 hours. No results for input(s): LIPASE, AMYLASE in the last 168 hours. No results for input(s): AMMONIA in the last 168 hours. Coagulation Profile: Recent Labs  Lab 06/06/18 2236  INR 1.11   Cardiac Enzymes: Recent Labs  Lab 06/06/18 2236  TROPONINI 0.12*   BNP (last 3 results) No results for input(s): PROBNP in the last 8760 hours. HbA1C: No results for input(s): HGBA1C in the last 72 hours. CBG: No results for  input(s): GLUCAP in the last 168 hours. Lipid Profile: No results for input(s): CHOL, HDL, LDLCALC, TRIG, CHOLHDL, LDLDIRECT in the last 72 hours. Thyroid Function Tests: No results for input(s): TSH, T4TOTAL, FREET4, T3FREE, THYROIDAB in the last 72 hours. Anemia Panel: No results for input(s): VITAMINB12, FOLATE, FERRITIN, TIBC, IRON, RETICCTPCT in the last 72 hours. Urine analysis:    Component Value Date/Time   COLORURINE YELLOW 06/06/2018 [place] 06/06/2018 2340   LABSPEC 1.013 06/06/2018 2340   PHURINE 5.0 06/06/2018 2340   GLUCOSEU NEGATIVE 06/06/2018  [place] 06/06/2018 [place] 06/06/2018 2340   [place] 06/06/2018 2340   PROTEINUR >=300 (A) 06/06/2018 2340   NITRITE NEGATIVE 06/06/2018 2340   LEUKOCYTESUR NEGATIVE 06/06/2018 2340   Sepsis Labs: @LABRCNTIP (procalcitonin:4,lacticidven:4) )No results found for this or any previous visit (from the past 240 hour(s)).   Radiological Exams on Admission: Dg Chest 2 View  Result Date: 06/06/2018 CLINICAL DATA:  Shortness of breath EXAM: CHEST - 2 VIEW COMPARISON:  11/17/2017, 09/24/2017 FINDINGS: Hyperinflation with emphysematous disease. Chronic pleural scarring at the left base. Chronic interstitial disease in the lower lobes. No acute opacity. Stable cardiomediastinal silhouette with aortic atherosclerosis. No pneumothorax. Carotid vascular calcification IMPRESSION: No active cardiopulmonary disease. Emphysematous disease with chronic interstitial changes at the bases and scarring at the left CP angle. Electronically Signed   By: Donavan Foil M.D.   On: 06/06/2018 22:25    EKG: Independently reviewed.  Normal sinus rhythm with LVH.  Assessment/Plan Principal Problem:   Hypertensive emergency Active Problems:   Acute CHF (congestive heart failure) ([name])   CKD (chronic kidney disease) stage 5, GFR less than 15 ml/min ([name])   Normocytic normochromic anemia   DM  (diabetes mellitus), type 2 with renal complications ([name])   Hypertensive urgency    1. Acute respiratory failure with hypoxia in the setting of markedly elevated blood pressure likely from acute CHF -patient has not taken her antihypertensives for last 1 week.  I have restarted her hydralazine and metoprolol now and will restart amlodipine and Imdur few hours from now.  PRN IV hydralazine for systolic blood pressure more than 180.  Gradually and slowly decrease blood pressure.  We will cycle cardiac markers check 2D echo.  Closely follow intake output  Since patient has have required recent dialysis if there is no improvement in output or respiratory status may need to consult nephrology. 2. Hypertensive emergency see #1. 3. Normocytic normochromic anemia likely from ESRD follow CBC. 4. Diabetes mellitus type 2 off medications.  Patient on sliding scale coverage. 5. History of CAD -placed back on patient's antiplatelet agents Imdur Toprol and Lipitor.  Likely cardiac markers check 2D echo. 6. Chronic abdominal pain after tunneled catheter placed.  The atrial catheter is still in place.  Patient states eventual plans are to remove it.  Has no rebound tenderness or tenderness or any rigidity.   DVT prophylaxis: SCDs for now until blood pressure improves. Code Status: Full code. Family Communication: Discussed with patient. Disposition Plan: Home. Consults called: None. Admission status: Inpatient.   Rise Patience MD [facility] Pager [phone].  If 7PM-7AM, please contact night-coverage [email or web address] Password [email or web address]  06/07/2018, 2:18 AM

## 2018-06-07 NOTE — Plan of Care (Signed)
Pt admits to being a 2-pak a day cigarette smoker. States she does not utilize oxygen at home.

## 2018-06-08 ENCOUNTER — Inpatient Hospital Stay (HOSPITAL_COMMUNITY): Payer: Medicare HMO

## 2018-06-08 DIAGNOSIS — N185 Chronic kidney disease, stage 5: Secondary | ICD-10-CM

## 2018-06-08 DIAGNOSIS — I161 Hypertensive emergency: Principal | ICD-10-CM

## 2018-06-08 DIAGNOSIS — Z01810 Encounter for preprocedural cardiovascular examination: Secondary | ICD-10-CM

## 2018-06-08 LAB — CBC
HEMATOCRIT: 32.1 % — AB (ref 36.0–46.0)
Hemoglobin: 9.8 g/dL — ABNORMAL LOW (ref 12.0–15.0)
MCH: 29.1 pg (ref 26.0–34.0)
MCHC: 30.5 g/dL (ref 30.0–36.0)
MCV: 95.3 fL (ref 78.0–100.0)
PLATELETS: 255 10*3/uL (ref 150–400)
RBC: 3.37 MIL/uL — ABNORMAL LOW (ref 3.87–5.11)
RDW: 15.6 % — AB (ref 11.5–15.5)
WBC: 6.6 10*3/uL (ref 4.0–10.5)

## 2018-06-08 LAB — GLUCOSE, CAPILLARY
GLUCOSE-CAPILLARY: 114 mg/dL — AB (ref 70–99)
GLUCOSE-CAPILLARY: 83 mg/dL (ref 70–99)
GLUCOSE-CAPILLARY: 105 mg/dL — AB (ref 70–99)
Glucose-Capillary: 110 mg/dL — ABNORMAL HIGH (ref 70–99)

## 2018-06-08 LAB — BASIC METABOLIC PANEL
ANION GAP: 11 (ref 5–15)
BUN: 46 mg/dL — AB (ref 8–23)
CALCIUM: 9.2 mg/dL (ref 8.9–10.3)
CO2: 21 mmol/L — AB (ref 22–32)
CREATININE: 4.45 mg/dL — AB (ref 0.44–1.00)
Chloride: 112 mmol/L — ABNORMAL HIGH (ref 98–111)
GFR calc Af Amer: 11 mL/min — ABNORMAL LOW (ref >60)
GFR, EST NON AFRICAN AMERICAN: 9 mL/min — AB (ref >60)
GLUCOSE: 104 mg/dL — AB (ref 70–99)
Potassium: 3.7 mmol/L (ref 3.5–5.1)
Sodium: 144 mmol/L (ref 135–145)

## 2018-06-08 LAB — MAGNESIUM: Magnesium: 2.1 mg/dL (ref 1.7–2.4)

## 2018-06-08 MED ORDER — BUPROPION HCL ER (SR) 150 MG PO TB12
150.0000 mg | ORAL_TABLET | Freq: Two times a day (BID) | ORAL | Status: DC
  Administered 2018-06-09 – 2018-06-10 (×3): 150 mg via ORAL
  Filled 2018-06-08 (×3): qty 1

## 2018-06-08 MED ORDER — INSULIN ASPART 100 UNIT/ML ~~LOC~~ SOLN: 0.0000 [IU] | Freq: Three times a day (TID) | SUBCUTANEOUS | Status: DC

## 2018-06-08 NOTE — Progress Notes (Signed)
3E AD spoke with dialysis. Dialysis states they will send someone to change pt's catheter dressing.

## 2018-06-08 NOTE — Progress Notes (Signed)
RN paged MD regarding if bedside nurse can change pt's peritoneal dialysis catheter dressing. Pt states dressing has not been changed since May. No new orders.

## 2018-06-08 NOTE — Plan of Care (Signed)
  Problem: Education: Goal: Knowledge of General Education information will improve Description Including pain rating scale, medication(s)/side effects and non-pharmacologic comfort measures Outcome: Progressing   Problem: Health Behavior/Discharge Planning: Goal: Ability to manage health-related needs will improve Outcome: Progressing   Problem: Clinical Measurements: Goal: Will remain free from infection Outcome: Progressing   Problem: Coping: Goal: Level of anxiety will decrease Outcome: Progressing

## 2018-06-08 NOTE — Consult Note (Addendum)
Hospital Consult    Reason for Consult:  In need of permanent hemodialysis access Requesting Physician:  Justin Mend MRN #:  [id number]  History of Present Illness: This is a 67 y.o. female who was admitted to the hospital a couple of days ago with increasing shortness of breath and uncontrolled hypertension.  She tells me she has hx of MI and CAD and has hx of PCI, she states she has had strokes in the past that affected both sides of her body.  She states that she is diabetic but she is not on any medication.  She states she had a PD catheter placed in May and she has pain from it now and has an appointment to have it removed with Dr. Olivia Mackie.  Since her admission, her shortness of breath and blood pressure have improved.  She does continue to smoke.  She is down to 5 cigarettes per day from 3 ppd.  She denies any pain in her legs but states she does have low back pain and thinks she may have sciatica.    She takes a daily aspirin.  She is on a BB and CCB for blood pressure management.  The pt is on a statin for cholesterol management.   She is left hand dominant.    Past Medical History:  Diagnosis Date  . Alcohol abuse   . Arthralgia   . CAD (coronary artery disease)    moderate primarily septal perforater  . Depression   . HTN (hypertension)   . Hypercholesterolemia    primarily ldl-p and small particles  . RAS (renal artery stenosis) ([place]) 2002   by cath tysinger  . Smoking     Past Surgical History:  Procedure Laterality Date  . abdominal aortogram     perclose of the right femoral artery  . CARDIAC CATHETERIZATION     left heart catheterization.  Coronary cineangiography. Lft ventricular cineangiography.    . TUBAL LIGATION      Allergies  Allergen Reactions  . Citalopram Hydrobromide Other (See Comments)    Unknown    Prior to Admission medications   Medication Sig Start Date End Date Taking? Authorizing Provider  ALPRAZolam (XANAX) 0.25 MG tablet Take 0.25 mg by  mouth 3 (three) times daily as needed for sleep. 04/28/18  Yes [provider]  amLODipine (NORVASC) 10 MG tablet Take 10 mg by mouth daily.   Yes [provider]  aspirin 81 MG tablet Take 81 mg by mouth daily.   Yes [provider]  atorvastatin (LIPITOR) 40 MG tablet Take 40 mg by mouth daily.   Yes [provider]  buPROPion (WELLBUTRIN SR) 150 MG 12 hr tablet Take 150 mg by mouth 2 (two) times daily. 03/17/18  Yes [provider]  Cholecalciferol (VITAMIN D3) 1000 UNITS CAPS Take 1,000 Units by mouth 2 (two) times daily.   Yes [provider]  clopidogrel (PLAVIX) 75 MG tablet Take 75 mg by mouth daily.   Yes [provider]  hydrALAZINE (APRESOLINE) 50 MG tablet Take 50 mg by mouth 3 (three) times daily.   Yes [provider]  isosorbide mononitrate (IMDUR) 30 MG 24 hr tablet Take 30 mg by mouth daily.   Yes [provider]  metoprolol succinate (TOPROL-XL) 50 MG 24 hr tablet Take 50 mg by mouth daily. Take with or immediately following a meal.   Yes [provider]  torsemide (DEMADEX) 20 MG tablet Take 20 mg by mouth daily.   Yes [provider]  traMADol (ULTRAM) 50 MG tablet Take 50 mg by mouth every 6 (six) hours as needed for pain. for pain 04/26/18  Yes [provider]    Social History   Socioeconomic History  . Marital status: Single    Spouse name: Not on file  . Number of children: Not on file  . Years of education: Not on file  . Highest education level: Not on file  Occupational History  . Not on file  Social Needs  . Financial resource strain: Not on file  . Food insecurity:    Worry: Not on file    Inability: Not on file  . Transportation needs:    Medical: Not on file    Non-medical: Not on file  Tobacco Use  . Smoking status: Current Every Day Smoker    Packs/day: 2.00    Years: 35.00    Pack years: 70.00    Types: Cigarettes  . Smokeless tobacco: Never  Used  Substance and Sexual Activity  . Alcohol use: Yes    Comment: Drinks 3 beers a day and one case of beer over the weekend  . Drug use: Not on file  . Sexual activity: Not on file  Lifestyle  . Physical activity:    Days per week: Not on file    Minutes per session: Not on file  . Stress: Not on file  Relationships  . Social connections:    Talks on phone: Not on file    Gets together: Not on file    Attends religious service: Not on file    Active member of club or organization: Not on file    Attends meetings of clubs or organizations: Not on file    Relationship status: Not on file  . Intimate partner violence:    Fear of current or ex partner: Not on file    Emotionally abused: Not on file    Physically abused: Not on file    Forced sexual activity: Not on file  Other Topics Concern  . Not on file  Social History Narrative  . Not on file     Family History  Problem Relation Age of Onset  . Emphysema Father     ROS: [x]  Positive   [ ]  Negative   [ ]  All sytems reviewed and are negative  Cardiac: []  chest pain/pressure []  palpitations [x]  SOB [x]  DOE  Vascular: []  pain in legs while walking []  pain in legs at rest []  pain in legs at night []  non-healing ulcers []  hx of DVT []  swelling in legs  Pulmonary: []  productive cough []  asthma/wheezing []  home O2  Neurologic: []  weakness in []  arms []  legs []  numbness in []  arms []  legs [x]  hx of CVA []  mini stroke [] difficulty speaking or slurred speech []  temporary loss of vision in one eye []  dizziness  Hematologic: []  hx of cancer []  bleeding problems []  problems with blood clotting easily  Endocrine:   [x]  diabetes [x]  thyroid disease-says she had thyroid disease when she was a teenager and took pills for it but has not been on anything since then.   GI []  vomiting blood []  blood in stool [x]  PD catheter in place  GU: [x]  CKD/renal failure []  HD--[]  M/W/F or []  T/T/S [x]  hx of PD but not  currently []  burning with urination []  blood in urine  Psychiatric: [x]  anxiety [x]  depression  Musculoskeletal: []  arthritis []  joint pain  Integumentary: []  rashes []  ulcers  Constitutional: []  fever []  chills   Physical Examination  Vitals:   06/08/18 0835 06/08/18 1233  BP: 125/72 (!) 114/103  Pulse: 83 65  Resp:  18  Temp:  97.7 F (36.5 C)  SpO2:  99%   Body mass index is 23.21 kg/m.  General:  WDWN in NAD Gait: Not observed HENT: WNL, normocephalic Pulmonary: normal non-labored breathing, without Rales, rhonchi,  wheezing Cardiac: regular, without  Murmurs, rubs or gallops; without carotid bruits Abdomen:  soft, NT/ND, no masses Skin: without rashes; bruise right antecubital space Vascular Exam/Pulses:  Right Left  Radial 2+ (normal) Unable to palpate   Ulnar 2+ (normal) Unable to palpate   DP Unable to palpate  Unable to palpate   PT Unable to palpate  Unable to palpate    Extremities: without ischemic changes, without Gangrene , without cellulitis; without open wounds;  Musculoskeletal: no muscle wasting or atrophy  Neurologic: A&O X 3;  No focal weakness or paresthesias are detected; speech is fluent/normal Psychiatric:  The pt has Normal affect.  CBC    Component Value Date/Time   WBC 6.6 06/08/2018 0409   RBC 3.37 (L) 06/08/2018 0409   HGB 9.8 (L) 06/08/2018 0409   HCT 32.1 (L) 06/08/2018 0409   PLT 255 06/08/2018 0409   MCV 95.3 06/08/2018 0409   MCH 29.1 06/08/2018 0409   MCHC 30.5 06/08/2018 0409   RDW 15.6 (H) 06/08/2018 0409   LYMPHSABS 1.3 06/07/2018 0316   MONOABS 0.4 06/07/2018 0316   EOSABS 0.3 06/07/2018 0316   BASOSABS 0.1 06/07/2018 0316    BMET    Component Value Date/Time   NA 144 06/08/2018 0409   K 3.7 06/08/2018 0409   CL 112 (H) 06/08/2018 0409   CO2 21 (L) 06/08/2018 0409   GLUCOSE 104 (H) 06/08/2018 0409   BUN 46 (H) 06/08/2018 0409   CREATININE 4.45 (H) 06/08/2018 0409   CALCIUM 9.2 06/08/2018 0409     GFRNONAA 9 (L) 06/08/2018 0409   GFRAA 11 (L) 06/08/2018 0409    COAGS: Lab Results  Component Value Date   INR 1.11 06/06/2018   INR 1.00 01/12/2011    BUE Vein mapping 06/08/18: Signed                          Statin:  Yes.   Beta Blocker:  Yes.   Aspirin:  Yes.   ACEI:  No. ARB:  No. CCB use:  Yes Other antiplatelets/anticoagulants:  Yes.   Plavix and sq heparin    ASSESSMENT/PLAN: This is a 67 y.o. female with CKD 5 in need of hemodialysis access   -pt currently has PD catheter that she has an appointment to have removed.   -she is left hand dominant and her vein looks good in the right arm-will restrict RUE.  She is not sure she is ready to proceed at this time.  Discussed with pt the importance of having access placed now as it takes time to mature and hopefully avoid a tunneled dialysis catheter. Discussed steal sx with the pt as well.  -should be a candidate for right arm radial cephalic AVF or brachial cephalic AVF. -Dr. Donzetta Matters will be by to see pt later today.     Leontine Locket, PA-C [facility] [phone]   I have independently interviewed and examined patient and agree with PA assessment and plan above.  This time patient does not want to proceed with dialysis access creation.  She has a scheduled appointment on August 12 to have her PD catheter removed and she is willing to wait until after this to discuss moving forward.  We talked at length with her about the need for maturation of the fistula which we can likely proceed with in her right upper extremity which is her nondominant arm.  We discussed the risks of primary nonfunction injury to surrounding structures and need for further procedures as well as steal and IMN. She demonstrates very good understanding and is unwilling to proceed at this time.  We will have office follow-up after August 12.  Oluwanifemi Petitti C. Donzetta Matters, MD [facility] Office:  [phone] Pager: [phone]   Addendum: I discussed with Dr. Justin Mend and since patient is not agreeable to proceed we will have her follow-up in the office after her request the date of August 12.  If she needs dialysis sooner we are happy to place a catheter and fistula at the same time.  Servando Snare

## 2018-06-08 NOTE — Progress Notes (Addendum)
ATTENTION:  DEPTH AND DIAMETER NUMBERS ARE REVERSED        Abram Sander 06/08/2018 12:08 PM

## 2018-06-08 NOTE — Progress Notes (Addendum)
PROGRESS NOTE    Kari Robinson  AJO:[id number] DOB: [birth year] DOA: 06/06/2018 PCP: Concepcion Elk, MD   Brief Narrative:  Patient is a 67 year old Caucasian female with past medical history significant for hypertension, chronic kidney disease stage V who was recently on peritoneal dialysis for about 3 or 4 sessions discontinued in May 2019 3 months ago after patient was not able to tolerate and also kidney function improved, CAD status post stenting and diabetes mellitus type 2 off medications presents to the ER because of a worsening shortness of breath 2 days prior to admission.  Patient states she has been getting short of breath on exertion and lying down and has not taken her antihypertensives for the last 1 week preceding admission as she had lost faith in it.  Denies any chest pain productive cough fever or chills.  Has been making urine.   Nephrology input is appreciated.  Vascular surgery team has been consulted as the patient will need placement of hemodialysis access.  General Surgery team will remove patient's PD catheter on an outpatient basis.  On speaking to the patient, the patient was not very clear about what she wants.  We will have a low threshold to consult the psychiatric services to assess patient.  Assessment & Plan:   Principal Problem:   Hypertensive emergency Active Problems:   Acute CHF (congestive heart failure) ([name])   CKD (chronic kidney disease) stage 5, GFR less than 15 ml/min ([name])   Normocytic normochromic anemia   DM (diabetes mellitus), type 2 with renal complications ([name])   Hypertensive urgency   1. Acute respiratory failure with hypoxia  Hypertensive Emergency: in the setting of markedly elevated blood pressure likely from acute CHF.  ? Progressive CKD.  Patient has not taken her antihypertensives for last 1 week.  She notes her symptoms have improved since receiving treatment last night   1. Restart home meds (hydralazine, metop, amlodipine, imdur).   Prn hydral   2. Follow echo 3. Trend troponins 4. Lasix IV 40 BID 5. Wean O2 as tolerated 6. I/O, daily weights  Patient continues to improve.  7. Chronic Kidney Disease:  Unclear baseline creatinine, creatinine in 04/04/18 was as high as 4.17.  Creatinine 3.93 from 3.85 at admission.  She'd been started on peritoneal dialysis in May, but stopped after 2-3 sessions due to abdominal pain.  Nephrology input is appreciated.  Vascular surgery team has been consulted for possible hemodialysis access.  PD catheter will be removed by the general surgery team on outpatient basis.  Further management will depend on hospital course.  Serum creatinine was 4.45 today.  Patient continues to make urine.    2. Elevated troponin:  Denies any chest pain.  Troponins elevated, but flat in the setting of CKD.  EKG with T wave inversions in II, III, aVF and V5, V6 (no recent EKG's, but EKG from 2013 with similar findings in V5, V6.  Will repeat EKG today and follow echo.  Consider cards c/s as needed.      3. Normocytic normochromic anemia likely from ESRD follow CBC.  Follow B12, folate, iron panel.    4. Diabetes mellitus type 2 off medications.  AM BG appropriate.  5. History of CAD - ASA, plavix, imdur, metoprolol, lipitor.  Follow echo as above.    6. Chronic abdominal pain after tunneled catheter placed.  The PD catheter is still in place.  No significant pain on my exam.  Site appears clean and dry without erythema.  DVT prophylaxis: heparin Code Status: full  Family Communication: none at bedside Disposition Plan: pending improvement   Consultants:   Nephrology  Vascular surgery  Procedures:  none  Antimicrobials:   none    Subjective: No shortness of breath. Patient is apprehensive about planned vascular access creation for hemodialysis.  Objective: Vitals:   06/08/18 0347 06/08/18 0835 06/08/18 1233 06/08/18 1426  BP: 111/65 125/72 (!) 114/103 92/61  Pulse: 82 83 65 (!) 58    Resp: 18  18 20   Temp: 97.9 F (36.6 C)  97.7 F (36.5 C)   TempSrc: Oral  Oral   SpO2: 98%  99% 99%  Weight: 61.3 kg (135 lb 3.2 oz)     Height:        Intake/Output Summary (Last 24 hours) at 06/08/2018 1615 Last data filed at 06/08/2018 0900 Gross per 24 hour  Intake 702 ml  Output 1200 ml  Net -498 ml   Filed Weights   06/06/18 2053 06/07/18 0324 06/08/18 0347  Weight: 66.7 kg (147 lb) 61.8 kg (136 lb 3.2 oz) 61.3 kg (135 lb 3.2 oz)    Examination:  General exam: Appears calm and comfortable  Respiratory system: Clear to auscultation. Respiratory effort normal. Cardiovascular system: S1 & S2 heard, RRR. No JVD, murmurs, rubs, gallops or clicks. No pedal edema. Gastrointestinal system: Abdomen is nondistended, soft and nontender.  L PD catheter.   Central nervous system: Alert and oriented. No focal neurological deficits. Extremities: Symmetric 5 x 5 power. Skin: No rashes, lesions or ulcers Psychiatry: Judgement and insight appear normal. Mood & affect appropriate.     Data Reviewed: I have personally reviewed following labs and imaging studies  CBC: Recent Labs  Lab 06/06/18 2236 06/07/18 0316 06/08/18 0409  WBC 6.9 6.8 6.6  NEUTROABS 4.9 4.7  --   HGB 9.2* 9.6* 9.8*  HCT 30.9* 32.8* 32.1*  MCV 97.2 95.9 95.3  PLT 220 242 735   Basic Metabolic Panel: Recent Labs  Lab 06/06/18 2236 06/07/18 0316 06/08/18 0409  NA 146* 147* 144  K 4.3 3.6 3.7  CL 118* 115* 112*  CO2 18* 21* 21*  GLUCOSE 101* 119* 104*  BUN 39* 39* 46*  CREATININE 3.85* 3.93* 4.45*  CALCIUM 9.1 9.2 9.2  MG 2.0  --  2.1   GFR: Estimated Creatinine Clearance: 10.6 mL/min (A) (by C-G formula based on SCr of 4.45 mg/dL (H)). Liver Function Tests: Recent Labs  Lab 06/07/18 0316  AST 15  ALT 9  ALKPHOS 98  BILITOT 0.5  PROT 6.5  ALBUMIN 3.5   No results for input(s): LIPASE, AMYLASE in the last 168 hours. No results for input(s): AMMONIA in the last 168 hours. Coagulation  Profile: Recent Labs  Lab 06/06/18 2236  INR 1.11   Cardiac Enzymes: Recent Labs  Lab 06/06/18 2236 06/07/18 0316 06/07/18 0949 06/07/18 1345  TROPONINI 0.12* 0.12* 0.10* 0.09*   BNP (last 3 results) No results for input(s): PROBNP in the last 8760 hours. HbA1C: No results for input(s): HGBA1C in the last 72 hours. CBG: Recent Labs  Lab 06/07/18 1146 06/07/18 1601 06/07/18 2122 06/08/18 0718 06/08/18 1231  GLUCAP 116* 105* 101* 110* 114*   Lipid Profile: No results for input(s): CHOL, HDL, LDLCALC, TRIG, CHOLHDL, LDLDIRECT in the last 72 hours. Thyroid Function Tests: Recent Labs    06/07/18 0316  TSH 0.632   Anemia Panel: Recent Labs    06/07/18 0949  VITAMINB12 366  FOLATE 9.7  FERRITIN 98  TIBC 291  IRON 31   Sepsis Labs: No results for input(s): PROCALCITON, LATICACIDVEN in the last 168 hours.  No results found for this or any previous visit (from the past 240 hour(s)).       Radiology Studies: Dg Chest 2 View  Result Date: 06/06/2018 CLINICAL DATA:  Shortness of breath EXAM: CHEST - 2 VIEW COMPARISON:  11/17/2017, 09/24/2017 FINDINGS: Hyperinflation with emphysematous disease. Chronic pleural scarring at the left base. Chronic interstitial disease in the lower lobes. No acute opacity. Stable cardiomediastinal silhouette with aortic atherosclerosis. No pneumothorax. Carotid vascular calcification IMPRESSION: No active cardiopulmonary disease. Emphysematous disease with chronic interstitial changes at the bases and scarring at the left CP angle. Electronically Signed   By: Donavan Foil M.D.   On: 06/06/2018 22:25        Scheduled Meds: . amLODipine  10 mg Oral Daily  . aspirin EC  81 mg Oral Daily  . atorvastatin  40 mg Oral Daily  . buPROPion  150 mg Oral BID WC  . clopidogrel  75 mg Oral Daily  . furosemide  40 mg Intravenous BID  . heparin injection (subcutaneous)  5,000 Units Subcutaneous Q8H  . hydrALAZINE  50 mg Oral Q8H  . insulin  aspart  0-9 Units Subcutaneous TID WC  . isosorbide mononitrate  30 mg Oral Daily  . metoprolol succinate  50 mg Oral Daily   Continuous Infusions:   LOS: 1 day    Time spent: over 47 min    Bonnell Public, MD [facility] Pager [phone] If 7PM-7AM, please contact night-coverage [email or web address] Password [facility] 06/08/2018, 4:15 PM

## 2018-06-08 NOTE — Progress Notes (Signed)
RN rounded on pt. Pt states she does not need anything at this time. 

## 2018-06-08 NOTE — Progress Notes (Addendum)
[facility] ROUNDING NOTE   Subjective:   Interval History: Doing overall well. Anxious about starting HD process.   Objective:  Vital signs in last 24 hours:  Temp:  [97.7 F (36.5 C)-97.9 F (36.6 C)] 97.9 F (36.6 C) (07/24 0347) Pulse Rate:  [81-85] 83 (07/24 0835) Resp:  [18-20] 18 (07/24 0347) BP: (111-140)/(65-100) 125/72 (07/24 0835) SpO2:  [96 %-99 %] 98 % (07/24 0347) Weight:  [135 lb 3.2 oz (61.3 kg)] 135 lb 3.2 oz (61.3 kg) (07/24 0347)  Weight change: -11 lb 12.8 oz (-5.352 kg) Filed Weights   06/06/18 01-27-2052 06/07/18 0324 06/08/18 0347  Weight: 147 lb (66.7 kg) 136 lb 3.2 oz (61.8 kg) 135 lb 3.2 oz (61.3 kg)    Intake/Output: I/O last 3 completed shifts: In: 01/27/63 [P.O.:1164] Out: Jan 26, 2250 [Urine:2250; Stool:1]   Intake/Output this shift:  Total I/O In: 240 [P.O.:240] Out: -   General - chronically-ill appearing female, laying in bed in NAD, tearful at times  CVS- RRR, no mrg  RS- CTAB, oxygenating well on 2L O2  ABD- BS present soft non-distended, PD cath on L abdomen  EXT- no edema bilaterally    Basic Metabolic Panel: Recent Labs  Lab 06/06/18 2236 06/07/18 0316 06/08/18 0409  NA 146* 147* 144  K 4.3 3.6 3.7  CL 118* 115* 112*  CO2 18* 21* 21*  GLUCOSE 101* 119* 104*  BUN 39* 39* 46*  CREATININE 3.85* 3.93* 4.45*  CALCIUM 9.1 9.2 9.2  MG 2.0  --  2.1    Liver Function Tests: Recent Labs  Lab 06/07/18 0316  AST 15  ALT 9  ALKPHOS 98  BILITOT 0.5  PROT 6.5  ALBUMIN 3.5   No results for input(s): LIPASE, AMYLASE in the last 168 hours. No results for input(s): AMMONIA in the last 168 hours.  CBC: Recent Labs  Lab 06/06/18 2236 06/07/18 0316 06/08/18 0409  WBC 6.9 6.8 6.6  NEUTROABS 4.9 4.7  --   HGB 9.2* 9.6* 9.8*  HCT 30.9* 32.8* 32.1*  MCV 97.2 95.9 95.3  PLT 220 242 255    Cardiac Enzymes: Recent Labs  Lab 06/06/18 2236 06/07/18 0316 06/07/18 0949 06/07/18 1345  TROPONINI 0.12* 0.12* 0.10* 0.09*     BNP: Invalid input(s): POCBNP  CBG: Recent Labs  Lab 06/07/18 0756 06/07/18 1146 06/07/18 1601 06/07/18 26-Jan-2121 06/08/18 0718  GLUCAP 104* 116* 105* 101* 110*    Microbiology: No results found for this or any previous visit.  Coagulation Studies: Recent Labs    06/06/18 27-Jan-2235  LABPROT 14.2  INR 1.11    Urinalysis: Recent Labs    06/06/18 2340  COLORURINE YELLOW  LABSPEC 1.013  PHURINE 5.0  GLUCOSEU NEGATIVE  HGBUR NEGATIVE  BILIRUBINUR NEGATIVE  KETONESUR NEGATIVE  PROTEINUR >=300*  NITRITE NEGATIVE  LEUKOCYTESUR NEGATIVE      Imaging: Dg Chest 2 View  Result Date: 06/06/2018 CLINICAL DATA:  Shortness of breath EXAM: CHEST - 2 VIEW COMPARISON:  11/17/2017, 09/24/2017 FINDINGS: Hyperinflation with emphysematous disease. Chronic pleural scarring at the left base. Chronic interstitial disease in the lower lobes. No acute opacity. Stable cardiomediastinal silhouette with aortic atherosclerosis. No pneumothorax. Carotid vascular calcification IMPRESSION: No active cardiopulmonary disease. Emphysematous disease with chronic interstitial changes at the bases and scarring at the left CP angle. Electronically Signed   By: Donavan Foil M.D.   On: 06/06/2018 22:25     Medications:    . amLODipine  10 mg Oral Daily  . aspirin EC  81 mg Oral Daily  . atorvastatin  40 mg Oral Daily  . buPROPion  150 mg Oral BID WC  . clopidogrel  75 mg Oral Daily  . furosemide  40 mg Intravenous BID  . heparin injection (subcutaneous)  5,000 Units Subcutaneous Q8H  . hydrALAZINE  50 mg Oral Q8H  . insulin aspart  0-9 Units Subcutaneous TID WC  . isosorbide mononitrate  30 mg Oral Daily  . metoprolol succinate  50 mg Oral Daily   acetaminophen **OR** acetaminophen, ALPRAZolam, hydrALAZINE, ondansetron **OR** ondansetron (ZOFRAN) IV, traMADol  Assessment/ Plan:  Pt is a 67 y.o. yo female with a PMHX of KD stage V recently on PD (stopped 03/2018 after improvement in renal  function), HTN, CAD s/p PCI, and T2DM off medications who presented with acute hypoxic respiratory in the setting of hypertensive emergency. Recently stopped PD 5/19, not on HD.  ` 1. CKD Stage V: Per documentation from 2012 she has fibromuscular dysplasia and bilateral RAS but has a history of congenitally absent L kidney. Baseline Cr 3.5-3.7 in the past 6 months but most recently 4.17 prior to admission. Responding well to IV Lasix 40 mg BID with UOP 2L and weight down 4 lbs. Cr 3.9 -> 4.4. Recently stopped PD in 03/2018. Follows up with Dr. Olivia Mackie in [facility] but trying to establish in [place]. Gen surg evaluated her yesterday and plan for removal of PD catheter as an outpatient. Vein mapping ordered and VVS consulted for AVF creation. Will not consult IR at this time as she is responding to diuresis. Continue diuresis with IV Lasix 40 mg BID. Continue stric I/Os, daily weights, monitoring of renal function and electrolytes.  We discussed the need for a long term plan  -- she wants to stay in [place] and she would like to eventually choose hemodialysis as mode of treatment , the only option would be early placement of fistula as this would maximize her chances of avoiding a catheter and would enable her to start dialysis with permanent access  2. HTN: currently on amlodipine 10, hydralazine 50mg  TID, Imdur 30 mg and metop XL 50 mg QD. Now normotensive. Would continue current regimen. Controlled   3. Volume: appears euvolemic on exam. Getting IV Lasix 40 mg BID with good UOP and decrease in weight 4 lbs. Continue strict I/Os and daily weights.  eventually could transition to oral diuretic   4. Anemia: Hgb 9.6 with baseline at 10-11. Folate, B12, and iron panel normal.   5. CAD s/p PCI: Presented with troponinemia and TWI in lateral leads. No active chest pain at this time. On metoprolol. TTE showed EF 45%, G1DD, global hypokinesis, and mild MR.   6. T2DM:  Per primary.    LOS: 1 Idalys  Santos-Sanchez 06/08/2018 10:47 AM

## 2018-06-09 ENCOUNTER — Telehealth: Payer: Self-pay | Admitting: Vascular Surgery

## 2018-06-09 LAB — BASIC METABOLIC PANEL
Anion gap: 12 (ref 5–15)
BUN: 53 mg/dL — ABNORMAL HIGH (ref 8–23)
CO2: 19 mmol/L — ABNORMAL LOW (ref 22–32)
Calcium: 8.9 mg/dL (ref 8.9–10.3)
Chloride: 110 mmol/L (ref 98–111)
Creatinine, Ser: 4.77 mg/dL — ABNORMAL HIGH (ref 0.44–1.00)
GFR calc Af Amer: 10 mL/min — ABNORMAL LOW (ref >60)
GFR calc non Af Amer: 9 mL/min — ABNORMAL LOW (ref >60)
Glucose, Bld: 97 mg/dL (ref 70–99)
Potassium: 4.2 mmol/L (ref 3.5–5.1)
Sodium: 141 mmol/L (ref 135–145)

## 2018-06-09 LAB — GLUCOSE, CAPILLARY
GLUCOSE-CAPILLARY: 101 mg/dL — AB (ref 70–99)
Glucose-Capillary: 97 mg/dL (ref 70–99)
Glucose-Capillary: 102 mg/dL — ABNORMAL HIGH (ref 70–99)
Glucose-Capillary: 128 mg/dL — ABNORMAL HIGH (ref 70–99)

## 2018-06-09 MED ORDER — SODIUM BICARBONATE 650 MG PO TABS: 650.0000 mg | ORAL_TABLET | Freq: Two times a day (BID) | ORAL | 0 refills | Status: DC

## 2018-06-09 MED ORDER — TORSEMIDE 20 MG PO TABS: 40.0000 mg | ORAL_TABLET | Freq: Every day | ORAL | 0 refills | Status: DC

## 2018-06-09 MED ORDER — SODIUM BICARBONATE 650 MG PO TABS
650.0000 mg | ORAL_TABLET | Freq: Two times a day (BID) | ORAL | Status: DC
  Administered 2018-06-09 – 2018-06-10 (×3): 650 mg via ORAL
  Filled 2018-06-09 (×3): qty 1

## 2018-06-09 MED ORDER — TORSEMIDE 20 MG PO TABS
40.0000 mg | ORAL_TABLET | Freq: Every day | ORAL | Status: DC
  Administered 2018-06-09 – 2018-06-10 (×2): 40 mg via ORAL
  Filled 2018-06-09 (×2): qty 2

## 2018-06-09 NOTE — Care Management Note (Addendum)
Case Management Note  Patient Details  Name: Kari Robinson MRN: [id number] Date of Birth: Aug 30, 1951  Subjective/Objective:                  HTN Emergency  Action/Plan: CM talked to patient at the bedside for [facility] choices, patient chose [place]; Dan with [facility] called for arrangements;   3:45 pm - patient complaining of shortness of breath with ambulation; Attending MD made aware; stated to cancel discharge order for today and to have physical therapy work with her tomorrow. Sharmon Leyden RN updated. Mindi Slicker [facility]   Expected Discharge Date:  06/09/18               Expected Discharge Plan:  [place]  Discharge planning Services  CM Consult  Choice offered to:  Patient  HH Arranged:  RN, Disease Management, PT HH Agency:   [facility]  Status of Service:  In process, will continue to follow  Sherrilyn Rist [phone] 06/09/2018, 3:36 PM

## 2018-06-09 NOTE — Progress Notes (Addendum)
[facility] ROUNDING NOTE   Subjective:   Interval History: Doing well this AM. Off supplemental oxygen. Declined AVF yesterday when evaluated by VVS.   Objective:  Vital signs in last 24 hours:  Temp:  [97.7 F (36.5 C)-98.1 F (36.7 C)] 98.1 F (36.7 C) (07/25 0427) Pulse Rate:  [51-75] 66 (07/25 0427) Resp:  [18-20] 20 (07/25 0427) BP: (92-127)/(57-103) 108/72 (07/25 0427) SpO2:  [98 %-100 %] 100 % (07/25 0427) Weight:  [137 lb (62.1 kg)] 137 lb (62.1 kg) (07/25 0427)  Weight change: 1 lb 12.8 oz (0.816 kg) Filed Weights   06/07/18 0324 06/08/18 0347 06/09/18 0427  Weight: 136 lb 3.2 oz (61.8 kg) 135 lb 3.2 oz (61.3 kg) 137 lb (62.1 kg)    Intake/Output: I/O last 3 completed shifts: In: 1080 [P.O.:1080] Out: 1050 [Urine:1050]   Intake/Output this shift:  Total I/O In: 360 [P.O.:360] Out: -   General - chronically-ill appearing female, laying in bed in NAD CVS- RRR, no mrg  RS- CTAB, oxygenating well on room air  ABD- BS present soft non-distended, PD cath on L abdomen  EXT- no edema bilaterally    Basic Metabolic Panel: Recent Labs  Lab 06/06/18 2236 06/07/18 0316 06/08/18 0409 06/09/18 0744  NA 146* 147* 144 141  K 4.3 3.6 3.7 4.2  CL 118* 115* 112* 110  CO2 18* 21* 21* 19*  GLUCOSE 101* 119* 104* 97  BUN 39* 39* 46* 53*  CREATININE 3.85* 3.93* 4.45* 4.77*  CALCIUM 9.1 9.2 9.2 8.9  MG 2.0  --  2.1  --     Liver Function Tests: Recent Labs  Lab 06/07/18 0316  AST 15  ALT 9  ALKPHOS 98  BILITOT 0.5  PROT 6.5  ALBUMIN 3.5   No results for input(s): LIPASE, AMYLASE in the last 168 hours. No results for input(s): AMMONIA in the last 168 hours.  CBC: Recent Labs  Lab 06/06/18 2236 06/07/18 0316 06/08/18 0409  WBC 6.9 6.8 6.6  NEUTROABS 4.9 4.7  --   HGB 9.2* 9.6* 9.8*  HCT 30.9* 32.8* 32.1*  MCV 97.2 95.9 95.3  PLT 220 242 255    Cardiac Enzymes: Recent Labs  Lab 06/06/18 2236 06/07/18 0316 06/07/18 0949  06/07/18 1345  TROPONINI 0.12* 0.12* 0.10* 0.09*    BNP: Invalid input(s): POCBNP  CBG: Recent Labs  Lab 06/08/18 0718 06/08/18 1231 06/08/18 1641 06/08/18 02/13/2054 06/09/18 0736  GLUCAP 110* 114* 83 105* 97    Microbiology: No results found for this or any previous visit.  Coagulation Studies: Recent Labs    06/06/18 02-14-2235  LABPROT 14.2  INR 1.11    Urinalysis: Recent Labs    06/06/18 2340  COLORURINE YELLOW  LABSPEC 1.013  PHURINE 5.0  GLUCOSEU NEGATIVE  HGBUR NEGATIVE  BILIRUBINUR NEGATIVE  KETONESUR NEGATIVE  PROTEINUR >=300*  NITRITE NEGATIVE  LEUKOCYTESUR NEGATIVE      Imaging: No results found.   Medications:    . amLODipine  10 mg Oral Daily  . aspirin EC  81 mg Oral Daily  . atorvastatin  40 mg Oral Daily  . buPROPion  150 mg Oral BID WC  . clopidogrel  75 mg Oral Daily  . heparin injection (subcutaneous)  5,000 Units Subcutaneous Q8H  . hydrALAZINE  50 mg Oral Q8H  . insulin aspart  0-9 Units Subcutaneous TID WC  . isosorbide mononitrate  30 mg Oral Daily  . metoprolol succinate  50 mg Oral Daily  . sodium bicarbonate  650 mg Oral BID  . torsemide  40 mg Oral Daily   acetaminophen **OR** acetaminophen, ALPRAZolam, hydrALAZINE, ondansetron **OR** ondansetron (ZOFRAN) IV, traMADol  Assessment/ Plan:  Pt is a 67 y.o. yo female with a PMHX of KD stage V recently on PD (stopped 03/2018 after improvement in renal function), HTN, CAD s/p PCI, and T2DM off medications who presented with acute hypoxic respiratory in the setting of hypertensive emergency. Recently stopped PD 5/19, not on HD.  ` 1. CKD Stage V: Per documentation from 2012 she has fibromuscular dysplasia and bilateral RAS but has a history of congenitally absent L kidney. Baseline Cr 3.5-3.7 in the past 6 months but most recently 4.17 prior to admission. She is on IV Lasix 40 mg BID. UOP slowing down with only 350cc out yesterday. Cr trending up 4.4-> 4.7. She does appear euvolemic on  exam and is off supplemental oxygen. We recommend transitioning to oral diuretics. She is on torsemide 20 mg daily, will increase to 40 mg QD. We will also start sodium bicarb at 650 mg BID given mild metabolic acidosis on labs. Follows up with Dr. Olivia Mackie in [facility] but wanting to transition care to [facility]. Will arrange follow up in [place] clinic in ~ 2 weeks (8/7 at 9 AM with Dr. Justin Mend). She already has a follow up appt with Dr. Raul Del on 8/12 for PD catheter removal. VVS evaluated yesterday for AVF but patient declined. Has VVS outpatient follow up on 8/16.  Continue stric I/Os, daily weights, monitoring of renal function and electrolytes.  We discussed the need for a long term plan. [place] for discharge from renal standpoint.  Hesitant about placement of access at this time although I believe that this is important - she declined surgery   2. HTN: Normotensive on home meds: amlodipine 10, hydralazine 50mg  TID, Imdur 30 mg and metop XL 50 mg QD. Would continue current regimen.  3. Volume: appears euvolemic on exam and off of supplemental oxygen. Transitioning to oral diuretics. On torsemide 20 mg QD, will increase to 40 mg QD. Continue strict I/Os and daily weights.  4. Anemia: Hgb 9.6 with baseline at 10-11. Folate, B12, and iron panel normal.   5. CAD s/p PCI: Presented with troponinemia and TWI in lateral leads. No active chest pain at this time. On metoprolol. TTE showed EF 45%, G1DD, global hypokinesis, and mild MR.   6. T2DM:  Per primary.   I will sign off for now  I have seen and examined this patient and agree with the plan of care   [facility] W 06/09/2018, 4:22 PM    LOS: 2 Idalys Santos-Sanchez 06/08/2018 10:11 AM

## 2018-06-09 NOTE — Progress Notes (Signed)
Patient feels uncomfortable with discharging home without oxygen. MD and case management made aware. Discharge order discontinued.

## 2018-06-09 NOTE — Progress Notes (Signed)
PROGRESS NOTE    Kari Robinson  YTK:[id number] DOB: 1951/05/11 DOA: 06/06/2018 PCP: Concepcion Elk, MD   Brief Narrative:  Patient is a 67 year old Caucasian female with past medical history significant for hypertension, chronic kidney disease stage V who was recently on peritoneal dialysis for about 3 or 4 sessions discontinued in May 2019 3 months ago after patient was not able to tolerate and also kidney function improved, CAD status post stenting and diabetes mellitus type 2 off medications presents to the ER because of a worsening shortness of breath 2 days prior to admission.  Patient states she has been getting short of breath on exertion and lying down and has not taken her antihypertensives for the last 1 week preceding admission as she had lost faith in it.  Denies any chest pain productive cough fever or chills.  Has been making urine.   Nephrology input is appreciated.  Vascular surgery team has been consulted as the patient will need placement of hemodialysis access.  General Surgery team will remove patient's PD catheter on an outpatient basis.  On speaking to the patient, the patient was not very clear about what she wants.  We will have a low threshold to consult the psychiatric services to assess patient.  Assessment & Plan:   Principal Problem:   Hypertensive emergency Active Problems:   Acute CHF (congestive heart failure) ([name])   CKD (chronic kidney disease) stage 5, GFR less than 15 ml/min ([name])   Normocytic normochromic anemia   DM (diabetes mellitus), type 2 with renal complications ([name])   Hypertensive urgency   1. Acute respiratory failure with hypoxia  Hypertensive Emergency: in the setting of markedly elevated blood pressure likely from acute CHF.  ? Progressive CKD.  Patient has not taken her antihypertensives for last 1 week.  She notes her symptoms have improved since receiving treatment last night   1. Restart home meds (hydralazine, metop, amlodipine, imdur).   Prn hydral   2. Follow echo 3. Trend troponins 4. Lasix IV 40 BID 5. Wean O2 as tolerated 6. I/O, daily weights  Patient continues to improve.  7. Chronic Kidney Disease:   -Unclear baseline creatinine, creatinine on 04/04/18 was as high as 4.17.   -Patient stopped peritoneal dialysis in May of this year due to abdominal pain.   -General surgery input, nephrology input and vascular surgery input is appreciated.   -The plan is for patient to proceed with hemodialysis eventually.   -General surgery will remove the PD Catheter on an outpatient basis.   -Vascular surgery will follow with patient and assess patient for creation of hemodialysis vascular access.   -Patient has been cleared for discharge by nephrology team.   -After patient's discharge was completed, patient refused to be discharged and was asking for home oxygen.   -O2 sat in room air, after ambulation was 90% as documented by the patient's nurse.   -We will get respiratory therapy to officially document the O2 sat and, with the numbers, make determination if patient needs supplemental oxygen on discharge.   -Patient has refused discharge, hence, this progress note.   -Serum creatinine today is 4.77.   -Patient remains euvolemic.   2. Elevated troponin:  Denies any chest pain.  Troponins elevated, but flat in the setting of CKD.  EKG with T wave inversions in II, III, aVF and V5, V6 (no recent EKG's, but EKG from 2013 with similar findings in V5, V6.  Will repeat EKG today and follow echo.  Consider cards  c/s as needed.      3. Normocytic normochromic anemia likely from ESRD follow CBC.  Follow B12, folate, iron panel.    4. Diabetes mellitus type 2 off medications.  AM BG appropriate.  5. History of CAD - ASA, plavix, imdur, metoprolol, lipitor.  Follow echo as above.    6. Chronic abdominal pain after tunneled catheter placed.  The PD catheter is still in place.  No significant pain on my exam.  Site appears clean and dry  without erythema.   DVT prophylaxis: heparin Code Status: full  Family Communication: none at bedside Disposition Plan: pending improvement   Consultants:   Nephrology  Vascular surgery  Procedures:  none  Antimicrobials:   none    Subjective: No complaints initially. Patient has refused discharge.  Objective: Vitals:   06/08/18 1943 06/08/18 2134 06/09/18 0427 06/09/18 1109  BP: (!) 117/57 127/64 108/72 116/72  Pulse: 75 (!) 51 66 71  Resp: 20  20 20   Temp: 97.7 F (36.5 C)  98.1 F (36.7 C) 98.6 F (37 C)  TempSrc: Oral  Oral Oral  SpO2: 98%  100% 100%  Weight:   62.1 kg (137 lb)   Height:        Intake/Output Summary (Last 24 hours) at 06/09/2018 1719 Last data filed at 06/09/2018 1300 Gross per 24 hour  Intake 960 ml  Output 250 ml  Net 710 ml   Filed Weights   06/07/18 0324 06/08/18 0347 06/09/18 0427  Weight: 61.8 kg (136 lb 3.2 oz) 61.3 kg (135 lb 3.2 oz) 62.1 kg (137 lb)    Examination:  General exam: Appears calm and comfortable  Respiratory system: Clear to auscultation. Respiratory effort normal. Cardiovascular system: S1 & S2 heard, RRR. No JVD, murmurs, rubs, gallops or clicks. No pedal edema. Gastrointestinal system: Abdomen is nondistended, soft and nontender.  L PD catheter.   Central nervous system: Alert and oriented. No focal neurological deficits. Extremities: Symmetric 5 x 5 power. Skin: No rashes, lesions or ulcers Psychiatry: Judgement and insight appear normal. Mood & affect appropriate.     Data Reviewed: I have personally reviewed following labs and imaging studies  CBC: Recent Labs  Lab 06/06/18 2236 06/07/18 0316 06/08/18 0409  WBC 6.9 6.8 6.6  NEUTROABS 4.9 4.7  --   HGB 9.2* 9.6* 9.8*  HCT 30.9* 32.8* 32.1*  MCV 97.2 95.9 95.3  PLT 220 242 778   Basic Metabolic Panel: Recent Labs  Lab 06/06/18 2236 06/07/18 0316 06/08/18 0409 06/09/18 0744  NA 146* 147* 144 141  K 4.3 3.6 3.7 4.2  CL 118* 115* 112*  110  CO2 18* 21* 21* 19*  GLUCOSE 101* 119* 104* 97  BUN 39* 39* 46* 53*  CREATININE 3.85* 3.93* 4.45* 4.77*  CALCIUM 9.1 9.2 9.2 8.9  MG 2.0  --  2.1  --    GFR: Estimated Creatinine Clearance: 9.9 mL/min (A) (by C-G formula based on SCr of 4.77 mg/dL (H)). Liver Function Tests: Recent Labs  Lab 06/07/18 0316  AST 15  ALT 9  ALKPHOS 98  BILITOT 0.5  PROT 6.5  ALBUMIN 3.5   No results for input(s): LIPASE, AMYLASE in the last 168 hours. No results for input(s): AMMONIA in the last 168 hours. Coagulation Profile: Recent Labs  Lab 06/06/18 2236  INR 1.11   Cardiac Enzymes: Recent Labs  Lab 06/06/18 2236 06/07/18 0316 06/07/18 0949 06/07/18 1345  TROPONINI 0.12* 0.12* 0.10* 0.09*   BNP (last 3 results) No  results for input(s): PROBNP in the last 8760 hours. HbA1C: No results for input(s): HGBA1C in the last 72 hours. CBG: Recent Labs  Lab 06/08/18 1641 06/08/18 2055 06/09/18 0736 06/09/18 1158 06/09/18 1609  GLUCAP 83 105* 97 101* 102*   Lipid Profile: No results for input(s): CHOL, HDL, LDLCALC, TRIG, CHOLHDL, LDLDIRECT in the last 72 hours. Thyroid Function Tests: Recent Labs    06/07/18 0316  TSH 0.632   Anemia Panel: Recent Labs    06/07/18 0949  VITAMINB12 366  FOLATE 9.7  FERRITIN 98  TIBC 291  IRON 31   Sepsis Labs: No results for input(s): PROCALCITON, LATICACIDVEN in the last 168 hours.  No results found for this or any previous visit (from the past 240 hour(s)).       Radiology Studies: No results found.      Scheduled Meds: . amLODipine  10 mg Oral Daily  . aspirin EC  81 mg Oral Daily  . atorvastatin  40 mg Oral Daily  . buPROPion  150 mg Oral BID WC  . clopidogrel  75 mg Oral Daily  . heparin injection (subcutaneous)  5,000 Units Subcutaneous Q8H  . hydrALAZINE  50 mg Oral Q8H  . insulin aspart  0-9 Units Subcutaneous TID WC  . isosorbide mononitrate  30 mg Oral Daily  . metoprolol succinate  50 mg Oral Daily    . sodium bicarbonate  650 mg Oral BID  . torsemide  40 mg Oral Daily   Continuous Infusions:   LOS: 2 days    Time spent:  25 min    Bonnell Public, MD [facility] Pager [phone] If 7PM-7AM, please contact night-coverage [email or web address] Password [facility] 06/09/2018, 5:19 PM

## 2018-06-09 NOTE — Discharge Summary (Signed)
Physician Discharge Summary  Patient ID: Kari Robinson MRN: [id number] DOB/AGE: 01/04/1951 67 y.o.  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  Patient was on peritoneal dialysis for about 3 or 4 sessions, but discontinued in May 2019 as patient was not able to tolerate peritoneal dialysis due to abdominal pain and, also, kidney function improved.  Patient has history of CAD status post stenting, diabetes mellitus type 2 but off medications.  Patient presented to the ER with worsening shortness of breath of 2 days  duration.  Patient endorsed dyspnea on exertion, orthopnea, and noncompliance with antihypertensive medications.  Apparently, patient had not taken her antihypertensives for about a week preceding admission, according to the patient, she had lost faith in her medication.  Patient has been making urine.   Patient was admitted for further assessment and management.  On admission, the patient was adequately diuresed with IV diuretics.  Blood pressure was cautiously controlled.  With the above, the patient's symptoms improved.  The blood pressure has been optimized.  General surgery team was consulted to assist in removal of the PD catheter.  The PD catheter dressing was not clean.  General surgery plans to remove the PD catheter on an outpatient basis.  Nephrology team was consulted to  assist with patient's care.  The plan is to prepare patient for hemodialysis.  Vascular surgery team was consulted to assess patient for hemodialysis vascular access.  Patient will follow with vascular surgery team on discharge.  Nephrology team assisted in directing patient's care.  And will also follow with the nephrology team on discharge.   Consults: nephrology, general surgery and vascular surgery.   Discharge Exam: Blood pressure 116/72, pulse 71, temperature 98.6 F (37 C), temperature source Oral, resp. rate 20, height 5\' 4"  (1.626 m), weight 62.1 kg (137 lb), SpO2 100 %.   Disposition: Discharge disposition: 01-Home or Self Care   Discharge Instructions    Call MD for:   Complete by:  As directed    Please call the MD for worsening of symptoms.   Diet - low sodium heart healthy   Complete by:  As directed    Renal diet   Discharge instructions   Complete by:  As directed    -Follow-up with general surgery to have PD catheter removed. -Follow with vascular surgery to continue assessment for the creation of hemodialysis vascular access.   Increase activity slowly   Complete by:  As directed      Allergies as of 06/09/2018      Reactions   Citalopram Hydrobromide Other (See Comments)   Unknown      Medication List    STOP taking these medications   ALPRAZolam 0.25 MG tablet Commonly known as:  XANAX   amLODipine 10 MG tablet Commonly known as:  NORVASC   traMADol 50 MG tablet Commonly known as:  ULTRAM   Vitamin D3 1000 units Caps     TAKE these medications  aspirin 81 MG tablet Take 81 mg by mouth daily.   atorvastatin 40 MG tablet Commonly known as:  LIPITOR Take 40 mg by mouth daily.   buPROPion 150 MG 12 hr tablet Commonly known as:  WELLBUTRIN SR Take 150 mg by mouth 2 (two) times daily.   clopidogrel 75 MG tablet Commonly known as:  PLAVIX Take 75 mg by mouth daily.   hydrALAZINE 50 MG tablet Commonly known as:  APRESOLINE Take 50 mg by  mouth 3 (three) times daily.   isosorbide mononitrate 30 MG 24 hr tablet Commonly known as:  IMDUR Take 30 mg by mouth daily.   metoprolol succinate 50 MG 24 hr tablet Commonly known as:  TOPROL-XL Take 50 mg by mouth daily. Take with or immediately following a meal.   sodium bicarbonate 650 MG tablet Take 1 tablet (650 mg total) by mouth 2 (two) times daily.   torsemide 20 MG tablet Commonly known as:  DEMADEX Take 2 tablets (40 mg total) by mouth daily. Start taking on:  06/10/2018 What changed:  how much to take      Follow-up Information    Edrick Oh, MD. Go to.   Specialty:  Nephrology Why:  You have an appointment on 06/22/2018 at 9 AM with Dr. Justin Mend. Please arrive 30 minutes early to fill out paperwork. Make sure to bring all of your medications to this appointment and any over the counter medications as well (vitamins, supplements, etc) Contact information: [place] [address] [phone]          Time spent on patient's discharge-32 minutes.   SignedBonnell Public 06/09/2018, 1:31 PM

## 2018-06-09 NOTE — Progress Notes (Signed)
MD made aware of patient's SOB with ambulation.

## 2018-06-09 NOTE — Telephone Encounter (Signed)
sch appt lvm 07/01/18 2pm f/u PA

## 2018-06-09 NOTE — Progress Notes (Signed)
Pt's BP very soft in the 80s -90s/50s - 60s. Pt has 50 mg of Hydralazine due. NP on call was notified and she advised to hold medication. Medication held. Will continue to monitor.

## 2018-06-09 NOTE — Progress Notes (Signed)
Patient ambulated in hallway on room air. Oxygen saturations were at 97%. Patient ambulated for about 25 ft due to SOB.

## 2018-06-10 LAB — GLUCOSE, CAPILLARY
GLUCOSE-CAPILLARY: 98 mg/dL (ref 70–99)
Glucose-Capillary: 102 mg/dL — ABNORMAL HIGH (ref 70–99)

## 2018-06-10 LAB — RENAL FUNCTION PANEL
Albumin: 3.5 g/dL (ref 3.5–5.0)
Anion gap: 18 — ABNORMAL HIGH (ref 5–15)
BUN: 66 mg/dL — ABNORMAL HIGH (ref 8–23)
CO2: 20 mmol/L — ABNORMAL LOW (ref 22–32)
Calcium: 8.9 mg/dL (ref 8.9–10.3)
Chloride: 104 mmol/L (ref 98–111)
Creatinine, Ser: 5.51 mg/dL — ABNORMAL HIGH (ref 0.44–1.00)
GFR calc Af Amer: 8 mL/min — ABNORMAL LOW (ref >60)
GFR calc non Af Amer: 7 mL/min — ABNORMAL LOW (ref >60)
Glucose, Bld: 111 mg/dL — ABNORMAL HIGH (ref 70–99)
Phosphorus: 6.3 mg/dL — ABNORMAL HIGH (ref 2.5–4.6)
Potassium: 4.3 mmol/L (ref 3.5–5.1)
Sodium: 142 mmol/L (ref 135–145)

## 2018-06-10 MED ORDER — FLUTICASONE FUROATE-VILANTEROL 100-25 MCG/INH IN AEPB
1.0000 | INHALATION_SPRAY | Freq: Every day | RESPIRATORY_TRACT | Status: DC
  Administered 2018-06-10: 1 via RESPIRATORY_TRACT
  Filled 2018-06-10: qty 28

## 2018-06-10 MED ORDER — IPRATROPIUM-ALBUTEROL 20-100 MCG/ACT IN AERS: 1.0000 | INHALATION_SPRAY | Freq: Four times a day (QID) | RESPIRATORY_TRACT | 1 refills | Status: DC | PRN

## 2018-06-10 MED ORDER — FLUTICASONE FUROATE-VILANTEROL 100-25 MCG/INH IN AEPB: 1.0000 | INHALATION_SPRAY | Freq: Every day | RESPIRATORY_TRACT | 1 refills | Status: DC

## 2018-06-10 MED ORDER — ALBUTEROL SULFATE (2.5 MG/3ML) 0.083% IN NEBU: 3.0000 mL | INHALATION_SOLUTION | Freq: Four times a day (QID) | RESPIRATORY_TRACT | Status: DC | PRN

## 2018-06-10 MED ORDER — IPRATROPIUM-ALBUTEROL 0.5-2.5 (3) MG/3ML IN SOLN: 3.0000 mL | Freq: Four times a day (QID) | RESPIRATORY_TRACT | 0 refills | Status: DC | PRN

## 2018-06-10 NOTE — Progress Notes (Signed)
Pt has orders to be discharged. Discharge instructions given and pt has no additional questions at this time. Medication regimen reviewed and pt educated. Pt verbalized understanding and has no additional questions. Telemetry box removed. IV removed and site in good condition. Pt stable and waiting for transportation. 

## 2018-06-10 NOTE — Discharge Summary (Signed)
Physician Discharge Summary  Patient ID: Kari Robinson MRN: [id number] DOB/AGE: October 06, 1951 67 y.o.  Admit date: 06/06/2018 Discharge date: 06/10/2018  Admission Diagnoses:  Discharge Diagnoses:  Principal Problem:   Hypertensive emergency Active Problems:   Acute CHF (congestive heart failure) ([name])   CKD (chronic kidney disease) stage 5, GFR less than 15 ml/min ([name])   Normocytic normochromic anemia   DM (diabetes mellitus), type 2 with renal complications ([name])   Hypertensive urgency   Discharged Condition: stable  Hospital Course: Patient is a 67 year old Caucasian female with past medical history significant for hypertension, bilateral renal artery stenosis, tobacco use, left lung cancer status post lobectomy, 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  Patient was on peritoneal dialysis for about 3 or 4 sessions, but discontinued in May 2019 as patient was not able to tolerate peritoneal dialysis due to abdominal pain and, also, kidney function improved.  Patient has history of CAD status post stenting, diabetes mellitus type 2 but off medications.  Patient presented to the ER with worsening shortness of breath of 2 days duration.  Patient endorsed dyspnea on exertion, orthopnea, and noncompliance with antihypertensive medications.  Apparently, patient had not taken her antihypertensives for about a week preceding admission, according to the patient, she hadlost faith in her medication.  Patient has been making urine.  Patient was admitted for further assessment and management.  On admission, the patient was adequately diuresed with IV diuretics.  Blood pressure was cautiously controlled.  With the above, the patient's symptoms improved.  The blood pressure has been optimized.  General surgery team was consulted to assist in removal of the PD catheter.  The PD catheter dressing was not clean.  General surgery plans to remove the PD catheter on an  outpatient basis.  Nephrology team was consulted to assist with patient's care.  The plan is to prepare patient for hemodialysis.  Vascular surgery team was consulted to assess patient for hemodialysis vascular access.  Patient will follow with vascular surgery team on discharge.  Nephrology team assisted in directing patient's care.  And will also follow with the nephrology team on discharge.  Addendum: Patient refused to be discharged back home yesterday.  Claimed that she needed home oxygen.  Patient seen today.  O2 sat on room air ranges from 98 to 100%.  Patient is not agreeing to being discharged back home.  Patient has been counseled to quit cigarettes.   Consults:  nephrology, general surgery and vascular surgery.  Discharge Exam: Blood pressure 103/68, pulse 71, temperature 97.6 F (36.4 C), temperature source Oral, resp. rate 20, height 5\' 4"  (1.626 m), weight 62.8 kg (138 lb 8 oz), SpO2 100 %.   Disposition: Discharge disposition: 06-Home-Health Care Svc   Discharge Instructions    Call MD for:   Complete by:  As directed    Please call the MD for worsening of symptoms.   Call MD for:   Complete by:  As directed    Call MD if symptoms worsen   Diet - low sodium heart healthy   Complete by:  As directed    Renal diet   Diet - low sodium heart healthy   Complete by:  As directed    Discharge instructions   Complete by:  As directed    -Follow-up with general surgery to have PD catheter removed. -Follow with vascular surgery to continue assessment for the creation of hemodialysis vascular access.   Discharge instructions   Complete  by:  As directed    Follow up with General Surgery - To remove the PD catheter   Increase activity slowly   Complete by:  As directed    Increase activity slowly   Complete by:  As directed      Allergies as of 06/10/2018      Reactions   Citalopram Hydrobromide Other (See Comments)   Unknown      Medication List    STOP taking these  medications   ALPRAZolam 0.25 MG tablet Commonly known as:  XANAX   amLODipine 10 MG tablet Commonly known as:  NORVASC   traMADol 50 MG tablet Commonly known as:  ULTRAM   Vitamin D3 1000 units Caps     TAKE these medications   aspirin 81 MG tablet Take 81 mg by mouth daily.   atorvastatin 40 MG tablet Commonly known as:  LIPITOR Take 40 mg by mouth daily.   buPROPion 150 MG 12 hr tablet Commonly known as:  WELLBUTRIN SR Take 150 mg by mouth 2 (two) times daily.   clopidogrel 75 MG tablet Commonly known as:  PLAVIX Take 75 mg by mouth daily.   fluticasone furoate-vilanterol 100-25 MCG/INH Aepb Commonly known as:  BREO ELLIPTA Inhale 1 puff into the lungs daily.   hydrALAZINE 50 MG tablet Commonly known as:  APRESOLINE Take 50 mg by mouth 3 (three) times daily.   ipratropium-albuterol 0.5-2.5 (3) MG/3ML Soln Commonly known as:  DUONEB Take 3 mLs by nebulization every 6 (six) hours as needed.   Ipratropium-Albuterol 20-100 MCG/ACT Aers respimat Commonly known as:  COMBIVENT RESPIMAT Inhale 1 puff into the lungs every 6 (six) hours as needed for wheezing or shortness of breath.   isosorbide mononitrate 30 MG 24 hr tablet Commonly known as:  IMDUR Take 30 mg by mouth daily.   metoprolol succinate 50 MG 24 hr tablet Commonly known as:  TOPROL-XL Take 50 mg by mouth daily. Take with or immediately following a meal.   sodium bicarbonate 650 MG tablet Take 1 tablet (650 mg total) by mouth 2 (two) times daily.   torsemide 20 MG tablet Commonly known as:  DEMADEX Take 2 tablets (40 mg total) by mouth daily. What changed:  how much to take      Follow-up Information    Go to Edrick Oh, MD.   Specialty:  Nephrology Why:  You have an appointment on 06/22/2018 at 9 AM with Dr. Justin Mend. Please arrive 30 minutes early to fill out paperwork. Make sure to bring all of your medications to this appointment and any over the counter medications as well (vitamins,  supplements, etc) Contact information: [place] [address] [phone]        Concepcion Elk, MD. Go on 06/27/2018.   Specialty:  Internal Medicine Why:  @3pm  Contact information: [address] [place] [address] [place] Follow up.   Why:  They will do your home health care at your home Contact information: [address] [place] [address] [phone]           Time spent -32 minutes.  SignedBonnell Public 06/10/2018, 9:40 AM

## 2018-06-23 ENCOUNTER — Other Ambulatory Visit: Payer: Self-pay

## 2018-06-23 ENCOUNTER — Inpatient Hospital Stay (HOSPITAL_COMMUNITY)
Admission: EM | Admit: 2018-06-23 | Discharge: 2018-06-25 | DRG: 305 | Disposition: A | Discharge status: Home / Self Care | Payer: Medicare Other | Attending: Internal Medicine | Admitting: Internal Medicine

## 2018-06-23 ENCOUNTER — Emergency Department (HOSPITAL_COMMUNITY): Payer: Medicare Other

## 2018-06-23 ENCOUNTER — Encounter (HOSPITAL_COMMUNITY): Payer: Self-pay | Admitting: Internal Medicine

## 2018-06-23 DIAGNOSIS — I161 Hypertensive emergency: Secondary | ICD-10-CM | POA: Diagnosis present

## 2018-06-23 DIAGNOSIS — I5022 Chronic systolic (congestive) heart failure: Secondary | ICD-10-CM | POA: Diagnosis not present

## 2018-06-23 DIAGNOSIS — J9601 Acute respiratory failure with hypoxia: Secondary | ICD-10-CM | POA: Diagnosis not present

## 2018-06-23 DIAGNOSIS — E1151 Type 2 diabetes mellitus with diabetic peripheral angiopathy without gangrene: Secondary | ICD-10-CM | POA: Diagnosis not present

## 2018-06-23 DIAGNOSIS — E785 Hyperlipidemia, unspecified: Secondary | ICD-10-CM | POA: Diagnosis not present

## 2018-06-23 DIAGNOSIS — Z7982 Long term (current) use of aspirin: Secondary | ICD-10-CM | POA: Diagnosis not present

## 2018-06-23 DIAGNOSIS — Z7902 Long term (current) use of antithrombotics/antiplatelets: Secondary | ICD-10-CM

## 2018-06-23 DIAGNOSIS — E1122 Type 2 diabetes mellitus with diabetic chronic kidney disease: Secondary | ICD-10-CM | POA: Diagnosis not present

## 2018-06-23 DIAGNOSIS — I739 Peripheral vascular disease, unspecified: Secondary | ICD-10-CM | POA: Diagnosis present

## 2018-06-23 DIAGNOSIS — I701 Atherosclerosis of renal artery: Secondary | ICD-10-CM | POA: Diagnosis present

## 2018-06-23 DIAGNOSIS — Z79899 Other long term (current) drug therapy: Secondary | ICD-10-CM

## 2018-06-23 DIAGNOSIS — Z9114 Patient's other noncompliance with medication regimen: Secondary | ICD-10-CM | POA: Diagnosis not present

## 2018-06-23 DIAGNOSIS — Z85118 Personal history of other malignant neoplasm of bronchus and lung: Secondary | ICD-10-CM

## 2018-06-23 DIAGNOSIS — Z825 Family history of asthma and other chronic lower respiratory diseases: Secondary | ICD-10-CM | POA: Diagnosis not present

## 2018-06-23 DIAGNOSIS — E7800 Pure hypercholesterolemia, unspecified: Secondary | ICD-10-CM | POA: Diagnosis present

## 2018-06-23 DIAGNOSIS — J449 Chronic obstructive pulmonary disease, unspecified: Secondary | ICD-10-CM | POA: Diagnosis present

## 2018-06-23 DIAGNOSIS — D631 Anemia in chronic kidney disease: Secondary | ICD-10-CM | POA: Diagnosis not present

## 2018-06-23 DIAGNOSIS — I2510 Atherosclerotic heart disease of native coronary artery without angina pectoris: Secondary | ICD-10-CM | POA: Diagnosis not present

## 2018-06-23 DIAGNOSIS — R52 Pain, unspecified: Secondary | ICD-10-CM

## 2018-06-23 DIAGNOSIS — Z7951 Long term (current) use of inhaled steroids: Secondary | ICD-10-CM | POA: Diagnosis not present

## 2018-06-23 DIAGNOSIS — F17210 Nicotine dependence, cigarettes, uncomplicated: Secondary | ICD-10-CM | POA: Diagnosis present

## 2018-06-23 DIAGNOSIS — Q600 Renal agenesis, unilateral: Secondary | ICD-10-CM | POA: Diagnosis not present

## 2018-06-23 DIAGNOSIS — E872 Acidosis: Secondary | ICD-10-CM | POA: Diagnosis not present

## 2018-06-23 DIAGNOSIS — Z885 Allergy status to narcotic agent status: Secondary | ICD-10-CM

## 2018-06-23 DIAGNOSIS — I132 Hypertensive heart and chronic kidney disease with heart failure and with stage 5 chronic kidney disease, or end stage renal disease: Secondary | ICD-10-CM | POA: Diagnosis not present

## 2018-06-23 DIAGNOSIS — Z902 Acquired absence of lung [part of]: Secondary | ICD-10-CM | POA: Diagnosis not present

## 2018-06-23 DIAGNOSIS — IMO0002 Reserved for concepts with insufficient information to code with codable children: Secondary | ICD-10-CM

## 2018-06-23 DIAGNOSIS — F329 Major depressive disorder, single episode, unspecified: Secondary | ICD-10-CM | POA: Diagnosis present

## 2018-06-23 DIAGNOSIS — Z955 Presence of coronary angioplasty implant and graft: Secondary | ICD-10-CM | POA: Diagnosis not present

## 2018-06-23 DIAGNOSIS — I509 Heart failure, unspecified: Secondary | ICD-10-CM

## 2018-06-23 DIAGNOSIS — Z888 Allergy status to other drugs, medicaments and biological substances status: Secondary | ICD-10-CM

## 2018-06-23 DIAGNOSIS — R0602 Shortness of breath: Secondary | ICD-10-CM | POA: Diagnosis present

## 2018-06-23 DIAGNOSIS — Z992 Dependence on renal dialysis: Secondary | ICD-10-CM | POA: Diagnosis present

## 2018-06-23 DIAGNOSIS — N185 Chronic kidney disease, stage 5: Secondary | ICD-10-CM | POA: Diagnosis not present

## 2018-06-23 DIAGNOSIS — E1129 Type 2 diabetes mellitus with other diabetic kidney complication: Secondary | ICD-10-CM | POA: Diagnosis present

## 2018-06-23 HISTORY — DX: Acute respiratory failure with hypoxia: J96.01

## 2018-06-23 HISTORY — DX: Heart failure, unspecified: I50.9

## 2018-06-23 LAB — CBC WITH DIFFERENTIAL/PLATELET
ABS IMMATURE GRANULOCYTES: 0 10*3/uL (ref 0.0–0.1)
BASOS ABS: 0.1 10*3/uL (ref 0.0–0.1)
Basophils Relative: 1 %
Eosinophils Absolute: 0.2 10*3/uL (ref 0.0–0.7)
Eosinophils Relative: 2 %
HCT: 31.4 % — ABNORMAL LOW (ref 36.0–46.0)
HEMOGLOBIN: 9.4 g/dL — AB (ref 12.0–15.0)
IMMATURE GRANULOCYTES: 0 %
LYMPHS PCT: 16 %
Lymphs Abs: 1.4 10*3/uL (ref 0.7–4.0)
MCH: 29.2 pg (ref 26.0–34.0)
MCHC: 29.9 g/dL — ABNORMAL LOW (ref 30.0–36.0)
MCV: 97.5 fL (ref 78.0–100.0)
MONO ABS: 0.6 10*3/uL (ref 0.1–1.0)
Monocytes Relative: 6 %
NEUTROS ABS: 6.7 10*3/uL (ref 1.7–7.7)
Neutrophils Relative %: 75 %
Platelets: 217 10*3/uL (ref 150–400)
RBC: 3.22 MIL/uL — ABNORMAL LOW (ref 3.87–5.11)
RDW: 15.4 % (ref 11.5–15.5)
WBC: 9 10*3/uL (ref 4.0–10.5)

## 2018-06-23 LAB — COMPREHENSIVE METABOLIC PANEL
ALK PHOS: 96 U/L (ref 38–126)
ALT: 11 U/L (ref 0–44)
AST: 18 U/L (ref 15–41)
Albumin: 3.6 g/dL (ref 3.5–5.0)
Anion gap: 11 (ref 5–15)
BUN: 39 mg/dL — ABNORMAL HIGH (ref 8–23)
CALCIUM: 8.7 mg/dL — AB (ref 8.9–10.3)
CO2: 16 mmol/L — AB (ref 22–32)
Chloride: 118 mmol/L — ABNORMAL HIGH (ref 98–111)
Creatinine, Ser: 3.5 mg/dL — ABNORMAL HIGH (ref 0.44–1.00)
GFR calc Af Amer: 15 mL/min — ABNORMAL LOW (ref >60)
GFR calc non Af Amer: 13 mL/min — ABNORMAL LOW (ref >60)
GLUCOSE: 107 mg/dL — AB (ref 70–99)
Potassium: 4.7 mmol/L (ref 3.5–5.1)
SODIUM: 145 mmol/L (ref 135–145)
Total Bilirubin: 0.9 mg/dL (ref 0.3–1.2)
Total Protein: 6.6 g/dL (ref 6.5–8.1)

## 2018-06-23 LAB — BRAIN NATRIURETIC PEPTIDE: B Natriuretic Peptide: 3557.5 pg/mL — ABNORMAL HIGH (ref 0.0–100.0)

## 2018-06-23 LAB — I-STAT TROPONIN, ED
Troponin i, poc: 0.07 ng/mL (ref 0.00–0.08)
Troponin i, poc: 0.06 ng/mL (ref 0.00–0.08)

## 2018-06-23 MED ORDER — FLUTICASONE-UMECLIDIN-VILANT 100-62.5-25 MCG/INH IN AEPB: 1.0000 | INHALATION_SPRAY | Freq: Every day | RESPIRATORY_TRACT | Status: DC

## 2018-06-23 MED ORDER — ALPRAZOLAM 0.25 MG PO TABS
0.2500 mg | ORAL_TABLET | Freq: Every evening | ORAL | Status: DC | PRN
  Administered 2018-06-23 – 2018-06-24 (×2): 0.25 mg via ORAL
  Filled 2018-06-23 (×2): qty 1

## 2018-06-23 MED ORDER — SODIUM CHLORIDE 0.9% FLUSH: 3.0000 mL | INTRAVENOUS | Status: DC | PRN

## 2018-06-23 MED ORDER — AMLODIPINE BESYLATE 10 MG PO TABS
10.0000 mg | ORAL_TABLET | Freq: Every day | ORAL | Status: DC
  Administered 2018-06-23 – 2018-06-25 (×3): 10 mg via ORAL
  Filled 2018-06-23 (×2): qty 1
  Filled 2018-06-23: qty 2

## 2018-06-23 MED ORDER — UMECLIDINIUM BROMIDE 62.5 MCG/INH IN AEPB
1.0000 | INHALATION_SPRAY | Freq: Every day | RESPIRATORY_TRACT | Status: DC
Start: 2018-06-23 — End: 2018-06-25
  Administered 2018-06-23 – 2018-06-25 (×3): 1 via RESPIRATORY_TRACT
  Filled 2018-06-23: qty 7

## 2018-06-23 MED ORDER — SODIUM CHLORIDE 0.9 % IV SOLN: 250.0000 mL | INTRAVENOUS | Status: DC | PRN

## 2018-06-23 MED ORDER — ONDANSETRON HCL 4 MG/2ML IJ SOLN: 4.0000 mg | Freq: Four times a day (QID) | INTRAMUSCULAR | Status: DC | PRN

## 2018-06-23 MED ORDER — SODIUM BICARBONATE 650 MG PO TABS
650.0000 mg | ORAL_TABLET | Freq: Two times a day (BID) | ORAL | Status: DC
  Administered 2018-06-23 – 2018-06-25 (×4): 650 mg via ORAL
  Filled 2018-06-23 (×5): qty 1

## 2018-06-23 MED ORDER — METOPROLOL SUCCINATE ER 50 MG PO TB24
50.0000 mg | ORAL_TABLET | Freq: Every day | ORAL | Status: DC
  Administered 2018-06-24 – 2018-06-25 (×2): 50 mg via ORAL
  Filled 2018-06-23 (×2): qty 1

## 2018-06-23 MED ORDER — FUROSEMIDE 10 MG/ML IJ SOLN
60.0000 mg | Freq: Two times a day (BID) | INTRAMUSCULAR | Status: DC
  Administered 2018-06-23 – 2018-06-25 (×4): 60 mg via INTRAVENOUS
  Filled 2018-06-23 (×2): qty 6
  Filled 2018-06-23: qty 8
  Filled 2018-06-23: qty 6

## 2018-06-23 MED ORDER — METOPROLOL TARTRATE 25 MG PO TABS
25.0000 mg | ORAL_TABLET | Freq: Once | ORAL | Status: AC
  Administered 2018-06-23: 25 mg via ORAL
  Filled 2018-06-23: qty 1

## 2018-06-23 MED ORDER — SODIUM CHLORIDE 0.9% FLUSH
3.0000 mL | Freq: Two times a day (BID) | INTRAVENOUS | Status: DC
  Administered 2018-06-23 – 2018-06-25 (×4): 3 mL via INTRAVENOUS

## 2018-06-23 MED ORDER — CLOPIDOGREL BISULFATE 75 MG PO TABS
75.0000 mg | ORAL_TABLET | Freq: Every day | ORAL | Status: DC
  Administered 2018-06-23 – 2018-06-25 (×3): 75 mg via ORAL
  Filled 2018-06-23 (×3): qty 1

## 2018-06-23 MED ORDER — ISOSORBIDE MONONITRATE ER 30 MG PO TB24
30.0000 mg | ORAL_TABLET | Freq: Every day | ORAL | Status: DC
  Administered 2018-06-23 – 2018-06-25 (×3): 30 mg via ORAL
  Filled 2018-06-23 (×4): qty 1

## 2018-06-23 MED ORDER — VITAMIN D 1000 UNITS PO TABS
2000.0000 [IU] | ORAL_TABLET | Freq: Every day | ORAL | Status: DC
  Administered 2018-06-23 – 2018-06-25 (×3): 2000 [IU] via ORAL
  Filled 2018-06-23 (×3): qty 2

## 2018-06-23 MED ORDER — ASPIRIN 81 MG PO CHEW
81.0000 mg | CHEWABLE_TABLET | Freq: Once | ORAL | Status: AC
  Administered 2018-06-23: 81 mg via ORAL
  Filled 2018-06-23: qty 1

## 2018-06-23 MED ORDER — BUPROPION HCL ER (SR) 150 MG PO TB12
150.0000 mg | ORAL_TABLET | Freq: Two times a day (BID) | ORAL | Status: DC
  Administered 2018-06-23 – 2018-06-25 (×4): 150 mg via ORAL
  Filled 2018-06-23 (×4): qty 1

## 2018-06-23 MED ORDER — FUROSEMIDE 10 MG/ML IJ SOLN
60.0000 mg | Freq: Once | INTRAMUSCULAR | Status: AC
  Administered 2018-06-23: 60 mg via INTRAVENOUS
  Filled 2018-06-23: qty 6

## 2018-06-23 MED ORDER — ALBUTEROL SULFATE (2.5 MG/3ML) 0.083% IN NEBU: 3.0000 mL | INHALATION_SOLUTION | RESPIRATORY_TRACT | Status: DC | PRN

## 2018-06-23 MED ORDER — ASPIRIN EC 81 MG PO TBEC
81.0000 mg | DELAYED_RELEASE_TABLET | Freq: Every day | ORAL | Status: DC
  Administered 2018-06-23 – 2018-06-25 (×3): 81 mg via ORAL
  Filled 2018-06-23 (×3): qty 1

## 2018-06-23 MED ORDER — HYDRALAZINE HCL 50 MG PO TABS
50.0000 mg | ORAL_TABLET | Freq: Three times a day (TID) | ORAL | Status: DC
  Administered 2018-06-23 – 2018-06-25 (×6): 50 mg via ORAL
  Filled 2018-06-23 (×2): qty 1
  Filled 2018-06-23: qty 2
  Filled 2018-06-23 (×3): qty 1

## 2018-06-23 MED ORDER — FLUTICASONE FUROATE-VILANTEROL 200-25 MCG/INH IN AEPB
1.0000 | INHALATION_SPRAY | Freq: Every day | RESPIRATORY_TRACT | Status: DC
  Administered 2018-06-23 – 2018-06-25 (×3): 1 via RESPIRATORY_TRACT
  Filled 2018-06-23: qty 28

## 2018-06-23 MED ORDER — IPRATROPIUM-ALBUTEROL 0.5-2.5 (3) MG/3ML IN SOLN: 3.0000 mL | Freq: Four times a day (QID) | RESPIRATORY_TRACT | Status: DC | PRN

## 2018-06-23 MED ORDER — ENSURE ENLIVE PO LIQD
237.0000 mL | Freq: Two times a day (BID) | ORAL | Status: DC
  Administered 2018-06-24 – 2018-06-25 (×2): 237 mL via ORAL

## 2018-06-23 MED ORDER — ONDANSETRON HCL 4 MG PO TABS: 4.0000 mg | ORAL_TABLET | Freq: Four times a day (QID) | ORAL | Status: DC | PRN

## 2018-06-23 MED ORDER — ATORVASTATIN CALCIUM 40 MG PO TABS
40.0000 mg | ORAL_TABLET | Freq: Every day | ORAL | Status: DC
  Administered 2018-06-23 – 2018-06-25 (×3): 40 mg via ORAL
  Filled 2018-06-23 (×2): qty 1
  Filled 2018-06-23: qty 2

## 2018-06-23 MED ORDER — BISACODYL 5 MG PO TBEC: 5.0000 mg | DELAYED_RELEASE_TABLET | Freq: Every day | ORAL | Status: DC | PRN

## 2018-06-23 MED ORDER — TRAZODONE HCL 50 MG PO TABS: 50.0000 mg | ORAL_TABLET | Freq: Every evening | ORAL | Status: DC | PRN

## 2018-06-23 MED ORDER — ACETAMINOPHEN 650 MG RE SUPP: 650.0000 mg | Freq: Four times a day (QID) | RECTAL | Status: DC | PRN

## 2018-06-23 MED ORDER — ACETAMINOPHEN 325 MG PO TABS: 650.0000 mg | ORAL_TABLET | Freq: Four times a day (QID) | ORAL | Status: DC | PRN

## 2018-06-23 NOTE — Consult Note (Signed)
[name]  Kari Robinson [birth year]  [id number].    Requesting MD: Joelyn Oms Chief Complaint/Reason for Consult: PD catheter removal HPI:  Patient is a 67 year old female who presented to [facility] with SOB that began yesterday. She didn't take antihypertensives yesterday because she was dealing with her SOB. Called EMS this AM and reportedly O2 sats were in the 60s and while en route she was on a nonrebreather with SBP around 200 mmHg. Given SL nitroglycerin, solu-medrol and placed on BiPAP. When she arrived to ED she was transitioned to nasal cannula as she had minimally labored breathing. She is being admitted to medicine for hypertensive emergency. PMH significant for CKD stage V, bilateral renal artery stenosis, congenital absence of left kidney, HTN, Left lung cancer s/p lobectomy, acute on chronic diastolic congestive heart failure. We are asked to see patient for consideration of PD catheter removal. Patient had PD catheter placed in [place] in May of this year, was unable to tolerate and now plans on transitioning to hemodialysis.   Patient has an appointment scheduled with surgeon who placed PD catheter on 06/27/18.   ROS: Review of Systems  Constitutional: Positive for malaise/fatigue. Negative for chills and fever.  Eyes: Negative for blurred vision.  Respiratory: Positive for shortness of breath and wheezing.   Cardiovascular: Negative for chest pain.  Gastrointestinal: Positive for abdominal pain (chronic low abdominal pain ). Negative for blood in stool, constipation, diarrhea, melena, nausea and vomiting.  Genitourinary: Negative for dysuria, frequency and urgency.  Neurological: Negative for dizziness, loss of consciousness and headaches.  All other systems reviewed and are negative.   Family History  Problem Relation Age of Onset  . Emphysema Father     Past Medical History:  Diagnosis Date  . Acute on chronic heart failure ([place])   . Acute  respiratory failure with hypoxia ([place])   . Alcohol abuse   . Arthralgia   . CAD (coronary artery disease)    moderate primarily septal perforater  . Depression   . HTN (hypertension)   . Hypercholesterolemia    primarily ldl-p and small particles  . RAS (renal artery stenosis) ([place]) 2002   by cath tysinger  . Smoking     Past Surgical History:  Procedure Laterality Date  . abdominal aortogram     perclose of the right femoral artery  . CARDIAC CATHETERIZATION     left heart catheterization.  Coronary cineangiography. Lft ventricular cineangiography.    . TUBAL LIGATION      Social History:  reports that she has been smoking cigarettes. She has a 70.00 pack-year smoking history. She has never used smokeless tobacco. She reports that she drinks alcohol. Her drug history is not on file.  Allergies:  Allergies  Allergen Reactions  . Citalopram Hydrobromide Hives  . Morphine Itching     (Not in a hospital admission)  Blood pressure (!) 168/81, pulse (!) 101, resp. rate 18, height _0  (1.626 m), weight 62.1 kg, SpO2 97 %. Physical Exam: Physical Exam  Constitutional: She is oriented to person, place, and time. She appears well-developed. She appears cachectic. She is cooperative.  Non-toxic appearance. No distress. Nasal cannula in place.  HENT:  Head: Normocephalic and atraumatic.  Nose: Nose normal.  Mouth/Throat: Abnormal dentition.  Eyes: Conjunctivae and lids are normal. No scleral icterus.  Neck: Normal range of motion. Neck supple.  Cardiovascular: Normal rate and regular rhythm. Exam reveals friction rub.  Pulses:      Radial  pulses are 2+ on the right side, and 2+ on the left side.       Dorsalis pedis pulses are 2+ on the right side, and 2+ on the left side.  No LE edema bilaterally   Pulmonary/Chest: Tachypnea (mildly ) noted. She has wheezes (bilaterally ). She has no rhonchi. She has no rales.  Abdominal: Soft. Bowel sounds are normal. She exhibits no  distension. There is generalized tenderness (subjective). There is no rigidity, no rebound and no guarding.  PD catheter in place with signs of infection   Musculoskeletal:  No gross deformities of bilateral upper and lower extremities. ROM grossly intact in bilateral upper and lower extremities  Neurological: She is alert and oriented to person, place, and time.  Skin: Skin is warm, dry and intact.  Multiple tattoos  Psychiatric: She has a normal mood and affect. Her speech is normal and behavior is normal.    Results for orders placed or performed during the hospital encounter of 06/23/18 (from the past 48 hour(s))  Brain natriuretic peptide     Status: Abnormal   Collection Time: 06/23/18 10:40 AM  Result Value Ref Range   B Natriuretic Peptide 3,557.5 (H) 0.0 - 100.0 pg/mL    Comment: Performed at [facility], [address] [place], [place] [address]  CBC with Differential     Status: Abnormal   Collection Time: 06/23/18 10:40 AM  Result Value Ref Range   WBC 9.0 4.0 - 10.5 K/uL   RBC 3.22 (L) 3.87 - 5.11 MIL/uL   Hemoglobin 9.4 (L) 12.0 - 15.0 g/dL   HCT 31.4 (L) 36.0 - 46.0 %   MCV 97.5 78.0 - 100.0 fL   MCH 29.2 26.0 - 34.0 pg   MCHC 29.9 (L) 30.0 - 36.0 g/dL   RDW 15.4 11.5 - 15.5 %   Platelets 217 150 - 400 K/uL   Neutrophils Relative % 75 %   Neutro Abs 6.7 1.7 - 7.7 K/uL   Lymphocytes Relative 16 %   Lymphs Abs 1.4 0.7 - 4.0 K/uL   Monocytes Relative 6 %   Monocytes Absolute 0.6 0.1 - 1.0 K/uL   Eosinophils Relative 2 %   Eosinophils Absolute 0.2 0.0 - 0.7 K/uL   Basophils Relative 1 %   Basophils Absolute 0.1 0.0 - 0.1 K/uL   Immature Granulocytes 0 %   Abs Immature Granulocytes 0.0 0.0 - 0.1 K/uL    Comment: Performed at [facility] [address] [place], [place] [address]  Comprehensive metabolic panel     Status: Abnormal   Collection Time: 06/23/18 10:40 AM  Result Value Ref Range   Sodium 145 135 - 145 mmol/L   Potassium 4.7 3.5 - 5.1  mmol/L   Chloride 118 (H) 98 - 111 mmol/L   CO2 16 (L) 22 - 32 mmol/L   Glucose, Bld 107 (H) 70 - 99 mg/dL   BUN 39 (H) 8 - 23 mg/dL   Creatinine, Ser 3.50 (H) 0.44 - 1.00 mg/dL   Calcium 8.7 (L) 8.9 - 10.3 mg/dL   Total Protein 6.6 6.5 - 8.1 g/dL   Albumin 3.6 3.5 - 5.0 g/dL   AST 18 15 - 41 U/L   ALT 11 0 - 44 U/L   Alkaline Phosphatase 96 38 - 126 U/L   Total Bilirubin 0.9 0.3 - 1.2 mg/dL   GFR calc non Af Amer 13 (L) >60 mL/min   GFR calc Af Amer 15 (L) >60 mL/min  Comment: (NOTE) The eGFR has been calculated using the CKD EPI equation. This calculation has not been validated in all clinical situations. eGFR's persistently <60 mL/min signify possible Chronic Kidney Disease.    Anion gap 11 5 - 15    Comment: Performed at [facility] [address], [place], [place] [address]  I-Stat Troponin, ED - 0, 3, 6 hours (not at [facility])     Status: None   Collection Time: 06/23/18 10:50 AM  Result Value Ref Range   Troponin i, poc 0.07 0.00 - 0.08 ng/mL   Comment 3            Comment: Due to the release kinetics of cTnI, a negative result within the first hours of the onset of symptoms does not rule out myocardial infarction with certainty. If myocardial infarction is still suspected, repeat the test at appropriate intervals.    Dg Chest Port 1 View  Result Date: 06/23/2018 CLINICAL DATA:  Shortness of breath today, history hypertension, kidney disease, LEFT lung surgery for lung cancer EXAM: PORTABLE CHEST 1 VIEW COMPARISON:  Portable exam 1050 hours compared to 06/06/2018 FINDINGS: Enlargement of cardiac silhouette with note of a coronary arterial stent. Atherosclerotic calcification aorta. Mediastinal contours and pulmonary vascularity normal. Chronic interstitial changes in the mid to lower lungs, unchanged. Surgical clips at LEFT hilum. Minimal RIGHT basilar atelectasis. No acute infiltrate, pleural effusion or pneumothorax. No acute osseous findings. IMPRESSION:  Enlargement of cardiac silhouette post coronary stenting. Chronic interstitial changes at the mid to lower lungs with mild RIGHT basilar atelectasis. Electronically Signed   By: Lavonia Dana M.D.   On: 06/23/2018 11:27      Assessment/Plan CKD stage V Acute on Chronic diastolic heart failure HTN with hypertensive emergency - per primary team Hx of left lung cancer s/p lobectomy  Hx of CAD s/p stenting  PD catheter removal - recommend patient follow up with surgeon who placed PD catheter at scheduled appointment 06/27/18 - if patient still hospitalized at that time, we can follow peripherally for stabilization of other current issues  - this does not need to be done urgently and should not keep patient in the hospital, she can follow up as an outpatient if she is discharged  Brigid Re, [facility] Surgery 06/23/2018, 3:17 PM Pager: [phone] Consults: [phone] Mon-Fri 7:00 am-4:30 pm Sat-Sun 7:00 am-11:30 am

## 2018-06-23 NOTE — H&P (Addendum)
History and Physical    Jinny Sweetland BTD:[id number] DOB: 07/19/1951 DOA: 06/23/2018  PCP: Concepcion Elk, MD Patient coming from: home  Chief Complaint: sob  HPI: Kari Robinson is a 67 y.o. 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 Initial evaluation reveals acute respiratory failure likely related to hypertensive emergency in the setting of worsening kidney disease leading to acute on chronic heart failure resulting in volume overload. [facility] are asked to admit  Information is obtained from the patient and the chart. She reports developing shortness of breath early yesterday. She reports she did not take her antihypertensive medications yesterday because she was busy trying to manage her shortness of breath. She tried inhalers to no avail. This morning EMS was called and reportedly noted oxygen saturation level 67% on room air. Also reportedly patient was on a nonrebreather when EMS arrived. Systolic blood pressure noted to be approximately 200. Patient was provided one sublingual nitroglycerin and placed on BiPAP. Solu-Medrol was also administered. When she got to the emergency department only minimal increased work of breathing she was taken off BiPAP and transition to nasal cannula. She denies headache dizziness syncope or near-syncope. She denies chest pain palpitations cough lower extremity edema. She does report chronic abdominal pain where she had a peritoneal dialysis catheter inserted. She was unable to tolerate peritoneal dialysis and is waiting for this catheter to be removed. She states she had an appointment with Dr. Justin Mend yesterday who indicated that she would likely need hemodialysis and she was at stage V kidney disease. She  states she makes urine and has not had any type of dialysis since May of this year. She denies nausea vomiting diarrhea constipation melena bright red blood per rectum. She denies dysuria hematuria frequency or urgency.   ED Course: in the emergency department blood pressure is 184/117 with a heart rate of 100. Oxygen saturation level 97% on room air. she is provided with 50 mg of metoprolol 60 mg of Lasix IV.  Review of Systems: As per HPI otherwise all other systems reviewed and are negative.   Ambulatory Status: ambulates with a walker at home. Independent with ADLs  Past Medical History:  Diagnosis Date  . Acute on chronic heart failure ([place])   . Acute respiratory failure with hypoxia ([place])   . Alcohol abuse   . Arthralgia   . CAD (coronary artery disease)    moderate primarily septal perforater  . Depression   . HTN (hypertension)   . Hypercholesterolemia    primarily ldl-p and small particles  . RAS (renal artery stenosis) ([place]) 2002   by cath tysinger  . Smoking     Past Surgical History:  Procedure Laterality Date  . abdominal aortogram     perclose of the right femoral artery  . CARDIAC CATHETERIZATION     left heart catheterization.  Coronary cineangiography. Lft ventricular cineangiography.    . TUBAL LIGATION      Social History   Socioeconomic History  . Marital status: Single    Spouse name: Not on file  . Number of children: Not on file  . Years of education: Not on file  . Highest education level: Not on file  Occupational History  . Not on file  Social Needs  . Financial resource strain: Not on file  .  Food insecurity:    Worry: Not on file    Inability: Not on file  . Transportation needs:    Medical: Not on file    Non-medical: Not on file  Tobacco Use  . Smoking status: Current Every Day Smoker    Packs/day: 2.00    Years: 35.00    Pack years: 70.00    Types: Cigarettes  . Smokeless tobacco: Never Used  Substance and Sexual Activity  .  Alcohol use: Yes    Comment: Drinks 3 beers a day and one case of beer over the weekend  . Drug use: Not on file  . Sexual activity: Not on file  Lifestyle  . Physical activity:    Days per week: Not on file    Minutes per session: Not on file  . Stress: Not on file  Relationships  . Social connections:    Talks on phone: Not on file    Gets together: Not on file    Attends religious service: Not on file    Active member of club or organization: Not on file    Attends meetings of clubs or organizations: Not on file    Relationship status: Not on file  . Intimate partner violence:    Fear of current or ex partner: Not on file    Emotionally abused: Not on file    Physically abused: Not on file    Forced sexual activity: Not on file  Other Topics Concern  . Not on file  Social History Narrative  . Not on file    Allergies  Allergen Reactions  . Citalopram Hydrobromide Hives  . Morphine Itching    Family History  Problem Relation Age of Onset  . Emphysema Father     Prior to Admission medications   Medication Sig Start Date End Date Taking? Authorizing Provider  albuterol (PROVENTIL HFA;VENTOLIN HFA) 108 (90 Base) MCG/ACT inhaler Inhale 4 puffs into the lungs every 4 (four) hours as needed for wheezing. 11/25/17  Yes [provider]  ALPRAZolam Duanne Moron) 0.25 MG tablet Take 0.25 mg by mouth at bedtime as needed for sleep. 06/16/18  Yes [provider]  amLODipine (NORVASC) 10 MG tablet Take 10 mg by mouth daily.   Yes [provider]  aspirin 81 MG tablet Take 81 mg by mouth daily.   Yes [provider]  atorvastatin (LIPITOR) 40 MG tablet Take 40 mg by mouth daily.   Yes [provider]  buPROPion (WELLBUTRIN SR) 150 MG 12 hr tablet Take 150 mg by mouth 2 (two) times daily. 03/17/18  Yes [provider]  Cholecalciferol (VITAMIN D) 2000 units tablet Take 2,000 Units by mouth daily. 01/07/18  Yes [provider]    clopidogrel (PLAVIX) 75 MG tablet Take 75 mg by mouth daily.   Yes [provider]  Fluticasone-Umeclidin-Vilant (TRELEGY ELLIPTA) 100-62.5-25 MCG/INH AEPB Inhale 1 puff into the lungs daily. 04/13/18  Yes [provider]  hydrALAZINE (APRESOLINE) 50 MG tablet Take 50 mg by mouth 3 (three) times daily.   Yes [provider]  ipratropium-albuterol (DUONEB) 0.5-2.5 (3) MG/3ML SOLN Take 3 mLs by nebulization every 6 (six) hours as needed. Patient taking differently: Take 3 mLs by nebulization every 6 (six) hours as needed (wheezing).  06/10/18  Yes Dana Allan I, MD  isosorbide mononitrate (IMDUR) 30 MG 24 hr tablet Take 30 mg by mouth daily.   Yes [provider]  metoprolol succinate (TOPROL-XL) 50 MG 24 hr tablet Take  50 mg by mouth daily. Take with or immediately following a meal.   Yes [provider]  sodium bicarbonate 650 MG tablet Take 1 tablet (650 mg total) by mouth 2 (two) times daily. 06/09/18  Yes Dana Allan I, MD  torsemide (DEMADEX) 20 MG tablet Take 2 tablets (40 mg total) by mouth daily. 06/10/18  Yes Bonnell Public, MD    Physical Exam: Vitals:   06/23/18 1045 06/23/18 1100 06/23/18 1115 06/23/18 1220  BP: (!) 177/115 (!) 160/113 (!) 167/135 (!) 182/105  Pulse: (!) 35 93 (!) 35 100  Resp: (!) 21 20 19    SpO2: 95% 95% 97%   Weight:      Height:         General:  Appears slightly anxious, sitting straight up in bed in no acute distress Eyes:  PERRL, EOMI, normal lids, iris ENT:  grossly normal hearing, lips & tongue, mucous membranes of her mouth are somewhat pale slightly dry Neck:  no LAD, masses or thyromegaly Cardiovascular:  Tachycardic but regular, no m/r/g. No LE edema.  Respiratory:  Mild increased work of breathing with conversation, hear fine crackles previously on the right upper lobes. No wheeze Complete sentences. Abdomen:  soft, ntnd, NABS Skin:  no rash or induration seen on limited exam, White  ashy in color Musculoskeletal:  grossly normal tone BUE/BLE, good ROM, no bony abnormality Psychiatric:  grossly normal mood and affect, speech fluent and appropriate, AOx3 Neurologic:  CN 2-12 grossly intact, moves all extremities in coordinated fashion, sensation intact  Labs on Admission: I have personally reviewed following labs and imaging studies  CBC: Recent Labs  Lab 06/23/18 1040  WBC 9.0  NEUTROABS 6.7  HGB 9.4*  HCT 31.4*  MCV 97.5  PLT 938   Basic Metabolic Panel: Recent Labs  Lab 06/23/18 1040  NA 145  K 4.7  CL 118*  CO2 16*  GLUCOSE 107*  BUN 39*  CREATININE 3.50*  CALCIUM 8.7*   GFR: Estimated Creatinine Clearance: 13.5 mL/min (A) (by C-G formula based on SCr of 3.5 mg/dL (H)). Liver Function Tests: Recent Labs  Lab 06/23/18 1040  AST 18  ALT 11  ALKPHOS 96  BILITOT 0.9  PROT 6.6  ALBUMIN 3.6   No results for input(s): LIPASE, AMYLASE in the last 168 hours. No results for input(s): AMMONIA in the last 168 hours. Coagulation Profile: No results for input(s): INR, PROTIME in the last 168 hours. Cardiac Enzymes: No results for input(s): CKTOTAL, CKMB, CKMBINDEX, TROPONINI in the last 168 hours. BNP (last 3 results) No results for input(s): PROBNP in the last 8760 hours. HbA1C: No results for input(s): HGBA1C in the last 72 hours. CBG: No results for input(s): GLUCAP in the last 168 hours. Lipid Profile: No results for input(s): CHOL, HDL, LDLCALC, TRIG, CHOLHDL, LDLDIRECT in the last 72 hours. Thyroid Function Tests: No results for input(s): TSH, T4TOTAL, FREET4, T3FREE, THYROIDAB in the last 72 hours. Anemia Panel: No results for input(s): VITAMINB12, FOLATE, FERRITIN, TIBC, IRON, RETICCTPCT in the last 72 hours. Urine analysis:    Component Value Date/Time   COLORURINE YELLOW 06/06/2018 [place] 06/06/2018 2340   LABSPEC 1.013 06/06/2018 2340   PHURINE 5.0 06/06/2018 2340   GLUCOSEU NEGATIVE 06/06/2018 2340   HGBUR  NEGATIVE 06/06/2018 2340   BILIRUBINUR NEGATIVE 06/06/2018 2340   KETONESUR NEGATIVE 06/06/2018 2340   PROTEINUR >=300 (A) 06/06/2018 2340   NITRITE NEGATIVE 06/06/2018 2340   LEUKOCYTESUR NEGATIVE 06/06/2018 2340  Creatinine Clearance: Estimated Creatinine Clearance: 13.5 mL/min (A) (by C-G formula based on SCr of 3.5 mg/dL (H)).  Sepsis Labs: @LABRCNTIP (procalcitonin:4,lacticidven:4) )No results found for this or any previous visit (from the past 240 hour(s)).   Radiological Exams on Admission: Dg Chest Port 1 View  Result Date: 06/23/2018 CLINICAL DATA:  Shortness of breath today, history hypertension, kidney disease, LEFT lung surgery for lung cancer EXAM: PORTABLE CHEST 1 VIEW COMPARISON:  Portable exam 1050 hours compared to 06/06/2018 FINDINGS: Enlargement of cardiac silhouette with note of a coronary arterial stent. Atherosclerotic calcification aorta. Mediastinal contours and pulmonary vascularity normal. Chronic interstitial changes in the mid to lower lungs, unchanged. Surgical clips at LEFT hilum. Minimal RIGHT basilar atelectasis. No acute infiltrate, pleural effusion or pneumothorax. No acute osseous findings. IMPRESSION: Enlargement of cardiac silhouette post coronary stenting. Chronic interstitial changes at the mid to lower lungs with mild RIGHT basilar atelectasis. Electronically Signed   By: Lavonia Dana M.D.   On: 06/23/2018 11:27    EKG: Independently reviewed. Sinus tachycardia Paired ventricular premature complexes Borderline right axis deviation LVH with secondary repolarization abnormality  Assessment/Plan Principal Problem:   Acute respiratory failure with hypoxia ([name]) Active Problems:   Hypertensive emergency   CKD (chronic kidney disease) stage 5, GFR less than 15 ml/min ([name])   Acute on chronic heart failure ([name])   Coronary atherosclerosis   RENAL ARTERY STENOSIS   PERIPHERAL VASCULAR DISEASE   DM (diabetes mellitus), type 2 with renal complications  ([place])   #1. Acute respiratory failure with hypoxia likely related to uncontrolled blood pressure in the setting of worsening renal function and acute on chronic heart failure. Oxygen saturation level 67% per EMS on room air in the field. Provided with nitroglycerin Solu-Medrol and BiPAP. She quickly improved at the time of admission oxygen saturation level greater than 90% on nasal cannula. Chest x-ray reveals enlargement of cardiac silhouette post coronary stenting, chronic interstitial changes at the mid to lower lungs with mild right basilar atelectasis. Initial troponin 0.07 which appears to be baseline, BNP 3,557.5. She is provided with IV Lasix and IV metoprolol in the emergency department -Admit to telemetry -continue oxygen supplementation -Monitor oxygen saturation level -Continue Lasix IV -daily weights -Intake and output -Improved blood pressure control -Await further recommendations from nephrology  #2. Hypertensive emergency. Patient's been noncompliant with her medications. Home medications include amlodipine, hydralazine, imdur, demadex and metroprolol. He was provided Lasix 60 mg IV in the emergency department as well as metoprolol -resume home meds -continue lasix IV -monitor closely  #3. Chronic kidney disease stage V. Previously on peritoneal dialysis but could not tolerate. She reports she tried 3 times in May but abdominal pain was too intense. She's had no form of dialysis since May. Was discharged approximately 2 weeks ago for same. During that hospitalization she was adequately diuresed with IV diuretics. Blood pressure gradually came in control. Chart review indicates general surgery was consulted to assist in removal of peritoneal dialysis catheter however dressing was not clean so general surgery plan to remove the catheter on outpatient basis. Patient reports she has not followed up. During that hospitalization nephrology was consulted and plan was to prepare for  hemodialysis. She states she saw Dr. Justin Mend yesterday who said she was likely going to need hemodialysis. Chart review also indicates vascular surgery was consulted to assess patient for hemodialysis on her prior hospitalization shin she was going to follow-up on an outpatient basis and has not as of yet. Creatinine 3.5 which is  lower than it was 2 weeks ago. -Nephrology consult -Monitor intake and output  #4. Acute on chronic diastolic heart failure. recent echo revealed an EF of 45%, mild LVH grade 1 diastolic dysfunction. Home medications include Demadex, metoprolol -IV Lasix as noted above -Monitor intake and output -daily weights -continue home meds as noted above  #5. Diabetes. Serum glucose 107. Chart review indicates diet controlled. -Carb modified diet  #6. History of CAD status post stenting. EKG as noted above. No chest pain. Troponin 0.07 -continue home meds    DVT prophylaxis:  plavix Code Status: full  Family Communication: none present  Disposition Plan: home  Consults called: [facility]  Admission status: inpatient    Radene Gunning NP [facility]  If 7PM-7AM, please contact night-coverage [email or web address] Password [facility]  06/23/2018, 12:39 PM

## 2018-06-23 NOTE — H&P (View-Only) (Signed)
   Daily Progress Note   Assessment/Planning:   Respiratory distress Hypertensive emergency ESRD Acute on CHF   See Dr. Claretha Cooper consult on 06/08/18.  At that time, Dr. Donzetta Matters recommended right arm AVF placement.  The patient refused then but now accepst she needs to proceed.  Given her respiratory distress and hypertensive emergency, will give the patient the weekend to medically stabilize. Risk, benefits, and alternatives to access surgery were discussed.   The patient is aware the risks include but are not limited to: bleeding, infection, steal syndrome, nerve damage, ischemic monomelic neuropathy, thrombosis, failure to mature, complications related to venous hypertension, need for additional procedures, death and stroke.   The patient agrees to proceed forward with the procedure.   Pt scheduled with Dr. Oneida Alar on Monday Preop ordered entered   Subjective   Breathing better, feeling better   Objective   Vitals:   06/23/18 1200 06/23/18 1220 06/23/18 1230 06/23/18 1457  BP: (!) 182/105 (!) 182/105 (!) 184/117 (!) 168/81  Pulse: (!) 101 100 (!) 101   Resp: (!) 22  18   SpO2: 96%  97%   Weight:      Height:         Intake/Output Summary (Last 24 hours) at 06/23/2018 1613 Last data filed at 06/23/2018 1547 Gross per 24 hour  Intake -  Output 200 ml  Net -200 ml    GEN NAD, alert PULM  B rales CV RRR GI  Soft, NTND, PD cath in place MS  No obvious edema, R arm without IV VASC  Palpable radial and brachial pulses    Laboratory   CBC CBC Latest Ref Rng & Units 06/23/2018 06/08/2018 06/07/2018  WBC 4.0 - 10.5 K/uL 9.0 6.6 6.8  Hemoglobin 12.0 - 15.0 g/dL 9.4(L) 9.8(L) 9.6(L)  Hematocrit 36.0 - 46.0 % 31.4(L) 32.1(L) 32.8(L)  Platelets 150 - 400 K/uL 217 255 242    BMET    Component Value Date/Time   NA 145 06/23/2018 1040   K 4.7 06/23/2018 1040   CL 118 (H) 06/23/2018 1040   CO2 16 (L) 06/23/2018 1040   GLUCOSE 107 (H) 06/23/2018 1040   BUN 39 (H)  06/23/2018 1040   CREATININE 3.50 (H) 06/23/2018 1040   CALCIUM 8.7 (L) 06/23/2018 1040   GFRNONAA 13 (L) 06/23/2018 1040   GFRAA 15 (L) 06/23/2018 1040   BNP 3557.5   Adele Barthel, MD, FACS [facility] Office: [phone] Pager: [phone]  06/23/2018, 4:13 PM

## 2018-06-23 NOTE — ED Notes (Signed)
Heart Healthy/ Carb Modified Diet was ordered for Dinner.

## 2018-06-23 NOTE — Consult Note (Signed)
Chanele Douglas Admit Date: 06/23/2018 06/23/2018 Rexene Agent Requesting Physician:  Zigmund Daniel MD  Reason for Consult:  CKD4-5, SOB, HTN, PD Catheter Present HPI:  67 year old female seen in the emergency department today after presenting with dyspnea and uncontrolled hypertension.  A similar event occurred at the end of July.  She was seen by Dr. Justin Mend at that time and transition care to our group where she previously had been seen in [facility].  She previously was to initiate peritoneal dialysis, in May of this year, but was intolerant of the procedure for unclear reasons and it was abandoned.  Since then she has failed to have the catheter removed, for reasons which are unclear but at least she says related to her moving to the [place] area.  She is not on hemodialysis.  During her previous admission she was seen by vascular surgery for AV access but patient declined.  Patient states that she was in her usual state of health but developed progressive dyspnea over the past 24 hours after having not taken her hydralazine or metoprolol.  EMS was called where she was found to be hypoxic and placed on BiPAP.  Systolic blood pressure was reported at 200 mmHg.  In the emergency room after receiving nitroglycerin and Solu-Medrol her breathing markedly improved and now she is on room air and blood pressures have improved.  One view chest x-ray with chronic interstitial changes, no edema, no effusions, mild right atelectasis.  Labs revealed serum creatinine of 3.5 which is improved from values at the end of July.  BUN 39.  Bicarbonate 16 with anion gap of 11.  Potassium 4.7.  LFTs within normal limits.  Hemoglobin is 9.4.  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  Increased  echogenicity  Chronic metabolic acidosis on sodium bicarbonate  Anemia, normocytic recent iron panel with features of iron deficiency iron saturation 11% and ferritin 98.   Creatinine, Ser (mg/dL)  Date Value  06/23/2018 3.50 (H)  06/10/2018 5.51 (H)  06/09/2018 4.77 (H)  06/08/2018 4.45 (H)  06/07/2018 3.93 (H)  06/06/2018 3.85 (H)  01/14/2011 1.10  01/12/2011 1.05  12/09/2010 1.1  ] ROS Balance of 12 systems is negative w/ exceptions as above  PMH  Past Medical History:  Diagnosis Date  . Acute on chronic heart failure ([place])   . Acute respiratory failure with hypoxia ([place])   . Alcohol abuse   . Arthralgia   . CAD (coronary artery disease)    moderate primarily septal perforater  . Depression   . HTN (hypertension)   . Hypercholesterolemia    primarily ldl-p and small particles  . RAS (renal artery stenosis) ([place]) 2002   by cath tysinger  . Smoking    PSH  Past Surgical History:  Procedure Laterality Date  . abdominal aortogram     perclose of the right femoral artery  . CARDIAC CATHETERIZATION     left heart catheterization.  Coronary cineangiography. Lft ventricular cineangiography.    . TUBAL LIGATION     FH  Family History  Problem Relation Age of Onset  . Emphysema Father    SH  reports that she has been smoking cigarettes. She has a 70.00 pack-year smoking history. She has never used smokeless tobacco. She reports that she drinks alcohol. Her drug  history is not on file. Allergies  Allergies  Allergen Reactions  . Citalopram Hydrobromide Hives  . Morphine Itching   Home medications Prior to Admission medications   Medication Sig Start Date End Date Taking? Authorizing Provider  albuterol (PROVENTIL HFA;VENTOLIN HFA) 108 (90 Base) MCG/ACT inhaler Inhale 4 puffs into the lungs every 4 (four) hours as needed for wheezing. 11/25/17  Yes [provider]  ALPRAZolam Duanne Moron) 0.25 MG tablet Take 0.25 mg by mouth at bedtime as needed for sleep.  06/16/18  Yes [provider]  amLODipine (NORVASC) 10 MG tablet Take 10 mg by mouth daily.   Yes [provider]  aspirin 81 MG tablet Take 81 mg by mouth daily.   Yes [provider]  atorvastatin (LIPITOR) 40 MG tablet Take 40 mg by mouth daily.   Yes [provider]  buPROPion (WELLBUTRIN SR) 150 MG 12 hr tablet Take 150 mg by mouth 2 (two) times daily. 03/17/18  Yes [provider]  Cholecalciferol (VITAMIN D) 2000 units tablet Take 2,000 Units by mouth daily. 01/07/18  Yes [provider]  clopidogrel (PLAVIX) 75 MG tablet Take 75 mg by mouth daily.   Yes [provider]  Fluticasone-Umeclidin-Vilant (TRELEGY ELLIPTA) 100-62.5-25 MCG/INH AEPB Inhale 1 puff into the lungs daily. 04/13/18  Yes [provider]  hydrALAZINE (APRESOLINE) 50 MG tablet Take 50 mg by mouth 3 (three) times daily.   Yes [provider]  ipratropium-albuterol (DUONEB) 0.5-2.5 (3) MG/3ML SOLN Take 3 mLs by nebulization every 6 (six) hours as needed. Patient taking differently: Take 3 mLs by nebulization every 6 (six) hours as needed (wheezing).  06/10/18  Yes Dana Allan I, MD  isosorbide mononitrate (IMDUR) 30 MG 24 hr tablet Take 30 mg by mouth daily.   Yes [provider]  metoprolol succinate (TOPROL-XL) 50 MG 24 hr tablet Take 50 mg by mouth daily. Take with or immediately following a meal.   Yes [provider]  sodium bicarbonate 650 MG tablet Take 1 tablet (650 mg total) by mouth 2 (two) times daily. 06/09/18  Yes Dana Allan I, MD  torsemide (DEMADEX) 20 MG tablet Take 2 tablets (40 mg total) by mouth daily. 06/10/18  Yes Bonnell Public, MD    Current Medications Scheduled Meds: . amLODipine  10 mg Oral Daily  . aspirin EC  81 mg Oral Daily  . atorvastatin  40 mg Oral Daily  . buPROPion  150 mg Oral BID  . cholecalciferol  2,000 Units Oral Daily  . clopidogrel  75 mg Oral Daily  .  Fluticasone-Umeclidin-Vilant  1 puff Inhalation Daily  . furosemide  60 mg Intravenous BID  . hydrALAZINE  50 mg Oral TID  . isosorbide mononitrate  30 mg Oral Daily  . [START ON 06/24/2018] metoprolol succinate  50 mg Oral Daily  . sodium bicarbonate  650 mg Oral BID  . sodium chloride flush  3 mL Intravenous Q12H   Continuous Infusions: . sodium chloride     PRN Meds:.sodium chloride, acetaminophen **OR** acetaminophen, albuterol, ALPRAZolam, bisacodyl, ipratropium-albuterol, ondansetron **OR** ondansetron (ZOFRAN) IV, sodium chloride flush  CBC Recent Labs  Lab 06/23/18 1040  WBC 9.0  NEUTROABS 6.7  HGB 9.4*  HCT 31.4*  MCV 97.5  PLT 811   Basic Metabolic Panel Recent Labs  Lab 06/23/18 1040  NA 145  K 4.7  CL 118*  CO2 16*  GLUCOSE 107*  BUN 39*  CREATININE 3.50*  CALCIUM 8.7*    Physical Exam  Blood pressure (!) 184/117, pulse (!) 101, resp. rate 18, height 5\' 4"  (1.626 m), weight 62.1 kg, SpO2 97 %. GEN: Chronically ill-appearing, no distress ENT: Edentulous EYES: Glasses CV: RRR, normal S1 and S2 PULM: Normal work of breathing, no crackles ABD: Soft, nontender.  PD catheter bandaged SKIN: No rashes or lesions EXT: No lower extremity edema   Assessment 28F with CKD4-5, not on RRT but with unused PD Catheter, admitted with SOB, acute severe HTN, and medication noncompliance  1. CKD4-5, now sees [place] at our office, congenitally absent L kidney 2. HTN, acute and severe after not taking her medications 3. SOB / Hypxoxia durign #2 probably from inc afterload, improved; CXR clear, no edema 4. PD catheter present but not being used; difficulty arranging removal as outpatient 5. Need for vascular acces, no immediate HD needs 6. Anemia with some features of IDA on recent labs  Plan 1. Resume outpatient BP meds and diuretics 2. Does not need HD 3. [facility] consulted to see if they would be willign to remove PD cath. Has been present and not used for some time, she  has tremendous difficulty with arranging outpt removal after moving to [place] She says. 4. VVS consutled, needs AVF or AVG 5. Will obtain recent note from visit with Dr. Justin Mend 6. Check Fe levels, probably needs Fereheme 7. Daily weights, Daily Renal Panel, Strict I/Os, Avoid nephrotoxins (NSAIDs, judicious IV Contrast)    Pearson Grippe MD [phone] pgr 06/23/2018, 2:54 PM

## 2018-06-23 NOTE — Progress Notes (Addendum)
   Daily Progress Note   Assessment/Planning:   Respiratory distress Hypertensive emergency ESRD Acute on CHF   See Dr. Claretha Cooper consult on 06/08/18.  At that time, Dr. Donzetta Matters recommended right arm AVF placement.  The patient refused then but now accepst she needs to proceed.  Given her respiratory distress and hypertensive emergency, will give the patient the weekend to medically stabilize. Risk, benefits, and alternatives to access surgery were discussed.   The patient is aware the risks include but are not limited to: bleeding, infection, steal syndrome, nerve damage, ischemic monomelic neuropathy, thrombosis, failure to mature, complications related to venous hypertension, need for additional procedures, death and stroke.   The patient agrees to proceed forward with the procedure.   Pt scheduled with Dr. Oneida Alar on Monday Preop ordered entered   Subjective   Breathing better, feeling better   Objective   Vitals:   06/23/18 1200 06/23/18 1220 06/23/18 1230 06/23/18 1457  BP: (!) 182/105 (!) 182/105 (!) 184/117 (!) 168/81  Pulse: (!) 101 100 (!) 101   Resp: (!) 22  18   SpO2: 96%  97%   Weight:      Height:         Intake/Output Summary (Last 24 hours) at 06/23/2018 1613 Last data filed at 06/23/2018 1547 Gross per 24 hour  Intake -  Output 200 ml  Net -200 ml    GEN NAD, alert PULM  B rales CV RRR GI  Soft, NTND, PD cath in place MS  No obvious edema, R arm without IV VASC  Palpable radial and brachial pulses    Laboratory   CBC CBC Latest Ref Rng & Units 06/23/2018 06/08/2018 06/07/2018  WBC 4.0 - 10.5 K/uL 9.0 6.6 6.8  Hemoglobin 12.0 - 15.0 g/dL 9.4(L) 9.8(L) 9.6(L)  Hematocrit 36.0 - 46.0 % 31.4(L) 32.1(L) 32.8(L)  Platelets 150 - 400 K/uL 217 255 242    BMET    Component Value Date/Time   NA 145 06/23/2018 1040   K 4.7 06/23/2018 1040   CL 118 (H) 06/23/2018 1040   CO2 16 (L) 06/23/2018 1040   GLUCOSE 107 (H) 06/23/2018 1040   BUN 39 (H)  06/23/2018 1040   CREATININE 3.50 (H) 06/23/2018 1040   CALCIUM 8.7 (L) 06/23/2018 1040   GFRNONAA 13 (L) 06/23/2018 1040   GFRAA 15 (L) 06/23/2018 1040   BNP 3557.5   Adele Barthel, MD, FACS [facility] Office: [phone] Pager: [phone]  06/23/2018, 4:13 PM

## 2018-06-23 NOTE — ED Notes (Signed)
Patient had purewick on however it didn't work , was incont of very large amt. Of urine in the bed. Patient cleaned and linens changed. Patient alert oriented.

## 2018-06-23 NOTE — ED Triage Notes (Signed)
Pt to ED via [facility] from home with c/o shortness of breath- arrived on c-pap, removed on arrival - pt O2 sats on room air -97%-- has not taken any  meds this morning from home.  Pt has hx of kidney disease - tried peritoneal dialysis - unable to do at home. Right arm is restricted for potential dialysis graft.

## 2018-06-23 NOTE — ED Provider Notes (Signed)
[facility] EMERGENCY DEPARTMENT Provider Note   CSN: [id number] Arrival date & time: 06/23/18  1021   History   Chief Complaint Chief Complaint  Patient presents with  . Shortness of Breath    HPI Kari Robinson is a 67 y.o. female.  HPI Patient is a 11-year female with history of CAD, hypertension, hyperlipidemia, CKD, and CHF (EF 40-45% 7/23)who presents to the emergency department today for evaluation of shortness of breath.  Reports developing shortness of breath since yesterday and called EMS this morning due to worsening shortness of breath.  Per EMS, fire department responded initially and noted saturations of 67% on RA.  Was on NRB when EMS arrived.  EMS noted coarse rales bilat and systolic blood pressure of approximately 200.  Placed on BiPAP and one sublingual nitroglycerin was given. Solumedrol also given. Patient reportedly feeling better by time of ED arrival.  No cough or fever or infectious symptoms.  No chest pain.  Reports chronic abdominal pain from where she previously tried peritoneal dialysis in May.  Tried 3 attempts in May with pain each time so has not undergone any further peritoneal dialysis.  Port remains in place.  Has not received any dialysis since May.  Is currently being worked up for vascular access placement and hemodialysis.  Has never had hemodialysis in the past.  Reports compliance with home medications.  No leg swelling.  No history of DVT or PE. Similar presentation requiring admission 7/22-7/26 for hypertensive emergency.   Past Medical History:  Diagnosis Date  . Alcohol abuse   . Arthralgia   . CAD (coronary artery disease)    moderate primarily septal perforater  . Depression   . HTN (hypertension)   . Hypercholesterolemia    primarily ldl-p and small particles  . RAS (renal artery stenosis) ([place]) 2002   by cath tysinger  . Smoking     Patient Active Problem List   Diagnosis Date Noted  . Acute CHF (congestive heart  failure) ([place]) 06/07/2018  . Hypertensive emergency 06/07/2018  . CKD (chronic kidney disease) stage 5, GFR less than 15 ml/min ([name]) 06/07/2018  . Normocytic normochromic anemia 06/07/2018  . DM (diabetes mellitus), type 2 with renal complications ([place]) 78/29/5621  . Hypertensive urgency 06/07/2018  . Occlusion and stenosis of carotid artery without mention of cerebral infarction 01/19/2012  . RENAL ARTERY STENOSIS 09/10/2010  . ABDOMINAL BRUIT 08/12/2010  . DEPRESSION 08/11/2010  . PERIPHERAL VASCULAR DISEASE 08/11/2010  . HYPERTENSION 08/08/2010  . CAD 08/08/2010  . ARTHRALGIA 08/08/2010    Past Surgical History:  Procedure Laterality Date  . abdominal aortogram     perclose of the right femoral artery  . CARDIAC CATHETERIZATION     left heart catheterization.  Coronary cineangiography. Lft ventricular cineangiography.    . TUBAL LIGATION       OB History   None      Home Medications    Prior to Admission medications   Medication Sig Start Date End Date Taking? Authorizing Provider  albuterol (PROVENTIL HFA;VENTOLIN HFA) 108 (90 Base) MCG/ACT inhaler Inhale 4 puffs into the lungs every 4 (four) hours as needed for wheezing. 11/25/17  Yes [provider]  ALPRAZolam Duanne Moron) 0.25 MG tablet Take 0.25 mg by mouth at bedtime as needed for sleep. 06/16/18  Yes [provider]  amLODipine (NORVASC) 10 MG tablet Take 10 mg by mouth daily.   Yes [provider]  aspirin 81 MG tablet Take 81 mg by mouth daily.  Yes [provider]  atorvastatin (LIPITOR) 40 MG tablet Take 40 mg by mouth daily.   Yes [provider]  buPROPion (WELLBUTRIN SR) 150 MG 12 hr tablet Take 150 mg by mouth 2 (two) times daily. 03/17/18  Yes [provider]  Cholecalciferol (VITAMIN D) 2000 units tablet Take 2,000 Units by mouth daily. 01/07/18  Yes [provider]  clopidogrel (PLAVIX) 75 MG tablet Take 75 mg by mouth daily.   Yes [provider]  Fluticasone-Umeclidin-Vilant (TRELEGY ELLIPTA) 100-62.5-25 MCG/INH AEPB Inhale 1 puff into the lungs daily. 04/13/18  Yes [provider]  hydrALAZINE (APRESOLINE) 50 MG tablet Take 50 mg by mouth 3 (three) times daily.   Yes [provider]  ipratropium-albuterol (DUONEB) 0.5-2.5 (3) MG/3ML SOLN Take 3 mLs by nebulization every 6 (six) hours as needed. Patient taking differently: Take 3 mLs by nebulization every 6 (six) hours as needed (wheezing).  06/10/18  Yes Dana Allan I, MD  isosorbide mononitrate (IMDUR) 30 MG 24 hr tablet Take 30 mg by mouth daily.   Yes [provider]  metoprolol succinate (TOPROL-XL) 50 MG 24 hr tablet Take 50 mg by mouth daily. Take with or immediately following a meal.   Yes [provider]  sodium bicarbonate 650 MG tablet Take 1 tablet (650 mg total) by mouth 2 (two) times daily. 06/09/18  Yes Dana Allan I, MD  torsemide (DEMADEX) 20 MG tablet Take 2 tablets (40 mg total) by mouth daily. 06/10/18  Yes Dana Allan I, MD  fluticasone furoate-vilanterol (BREO ELLIPTA) 100-25 MCG/INH AEPB Inhale 1 puff into the lungs daily. Patient not taking: Reported on 06/23/2018 06/10/18   Dana Allan I, MD  Ipratropium-Albuterol (COMBIVENT RESPIMAT) 20-100 MCG/ACT AERS respimat Inhale 1 puff into the lungs every 6 (six) hours as needed for wheezing or shortness of breath. Patient not taking: Reported on 06/23/2018 06/10/18   Bonnell Public, MD    Family History Family History  Problem Relation Age of Onset  . Emphysema Father     Social History Social History   Tobacco Use  . Smoking status: Current Every Day Smoker    Packs/day: 2.00    Years: 35.00    Pack years: 70.00    Types: Cigarettes  . Smokeless tobacco: Never Used  Substance Use Topics  . Alcohol use: Yes    Comment: Drinks 3 beers a day and one case of beer over the weekend  . Drug use: Not on file     Allergies   Citalopram  hydrobromide and Morphine   Review of Systems Review of Systems  Constitutional: Negative for chills and fever.  HENT: Negative for congestion and sore throat.   Eyes: Negative for visual disturbance.  Respiratory: Positive for shortness of breath. Negative for cough.   Cardiovascular: Negative for chest pain and leg swelling.  Gastrointestinal: Positive for abdominal pain (chronic since may, lower abdomen, unchanged). Negative for diarrhea, nausea and vomiting.  Genitourinary: Negative for dysuria and hematuria.  Musculoskeletal: Negative for back pain and neck pain.  Skin: Negative for color change and rash.  Neurological: Negative for weakness and headaches.  All other systems reviewed and are negative.    Physical Exam Updated Vital Signs BP (!) 167/135   Pulse (!) 35   Resp 19   Ht 5\' 4"  (1.626 m)   Wt 62.1 kg   SpO2 97%   BMI 23.52 kg/m   Physical Exam  Constitutional: She appears well-developed and well-nourished. No distress.  HENT:  Head: Normocephalic and atraumatic.  Eyes: Conjunctivae are normal.  Neck: Neck supple.  Cardiovascular: Regular rhythm and intact distal pulses.  Pulmonary/Chest: Effort normal. No respiratory distress. She has rales (coarse, diffuse, bilat).  On Bipap on EMS arrival. Taken off and appears comfortable. Speaking full sentences, talking and laughing, no accessory muscle use.   Abdominal: Soft. She exhibits no distension. There is tenderness (mild throughout lower abdomen).  PD catheter site c/d/i  Musculoskeletal: She exhibits no edema.       Right lower leg: She exhibits no edema.       Left lower leg: She exhibits no edema.  Symmetric BLEs  Neurological: She is alert.  Skin: Skin is warm and dry. Capillary refill takes less than 2 seconds.  Psychiatric: She has a normal mood and affect.  Nursing note and vitals reviewed.    ED Treatments / Results  Labs (all labs ordered are listed, but only abnormal results are  displayed) Labs Reviewed  BRAIN NATRIURETIC PEPTIDE - Abnormal; Notable for the following components:      Result Value   B Natriuretic Peptide 3,557.5 (*)    All other components within normal limits  CBC WITH DIFFERENTIAL/PLATELET - Abnormal; Notable for the following components:   RBC 3.22 (*)    Hemoglobin 9.4 (*)    HCT 31.4 (*)    MCHC 29.9 (*)    All other components within normal limits  COMPREHENSIVE METABOLIC PANEL - Abnormal; Notable for the following components:   Chloride 118 (*)    CO2 16 (*)    Glucose, Bld 107 (*)    BUN 39 (*)    Creatinine, Ser 3.50 (*)    Calcium 8.7 (*)    GFR calc non Af Amer 13 (*)    GFR calc Af Amer 15 (*)    All other components within normal limits  I-STAT TROPONIN, ED    EKG EKG Interpretation  Date/Time:  Thursday June 23 2018 10:27:12 EDT Ventricular Rate:  111 PR Interval:    QRS Duration: 95 QT Interval:  339 QTC Calculation: 455 R Axis:   81 Text Interpretation:  Sinus tachycardia Paired ventricular premature complexes Borderline right axis deviation LVH with secondary repolarization abnormality ST depression, consider ischemia, diffuse lds Confirmed by Quintella Reichert [phone]) on 06/23/2018 10:30:31 AM   Radiology Dg Chest Port 1 View  Result Date: 06/23/2018 CLINICAL DATA:  Shortness of breath today, history hypertension, kidney disease, LEFT lung surgery for lung cancer EXAM: PORTABLE CHEST 1 VIEW COMPARISON:  Portable exam 1050 hours compared to 06/06/2018 FINDINGS: Enlargement of cardiac silhouette with note of a coronary arterial stent. Atherosclerotic calcification aorta. Mediastinal contours and pulmonary vascularity normal. Chronic interstitial changes in the mid to lower lungs, unchanged. Surgical clips at LEFT hilum. Minimal RIGHT basilar atelectasis. No acute infiltrate, pleural effusion or pneumothorax. No acute osseous findings. IMPRESSION: Enlargement of cardiac silhouette post coronary stenting. Chronic  interstitial changes at the mid to lower lungs with mild RIGHT basilar atelectasis. Electronically Signed   By: Lavonia Dana M.D.   On: 06/23/2018 11:27    Procedures Procedures (including critical care time)  Medications Ordered in ED Medications  furosemide (LASIX) injection 60 mg (has no administration in time range)  metoprolol tartrate (LOPRESSOR) tablet 25 mg (has no administration in time range)  aspirin chewable tablet 81 mg (has no administration in time range)     Initial Impression / Assessment and Plan / ED Course  I have reviewed the triage  vital signs and the nursing notes.  Pertinent labs & imaging results that were available during my care of the patient were reviewed by me and considered in my medical decision making (see chart for details).    Patient is a 48-year female with history of CAD, hypertension, hyperlipidemia, CKD, and CHF (EF 40-45% 7/23)who presents to the emergency department today for evaluation of shortness of breath.   Pt hypertensive at presentation though down from pre-hospital BP of 200s/170s. ECG reviewed shows sinus tachycardia with PVCs, right approximate 110, PR intervals appear within normal limits, no ST elevation MI.  Does have lateral lead ST depressions. No major change when compared to 7/22 ECG. No CP. Differential includes flash pulmonary edema, hypertensive emergency, less likely ACS. No leg swelling or history of DVT/PE making this less likely. Tolerating nasal cannula well in ED. CXR obtained with enlarged cardiac silhouette, no significant edema, no focal consolidation to suggest pneumonia.  Feeling better now that blood pressure has improved and does not require any further antihypertensives here in the emergency department given a proximal 25% reduction in blood pressure since EMS arrival. Home metop given as she had not had it today. Now stable on room air.   CBC with chronic anemia unchangd. No leukocytosis. No signs of infection. CMP  with chronic renal dysfunction (improved from higher). Bicarb 16. BNP >3500. IV lasix given. Findings concerning for hypertensive emergency. Given adequate BP reduction will continue with diuresis and restart home meds. Will obtain delta trop though lower suspicion for ACS at this time. No CP here in ED. Medicine team consulted and will admit. Stable with no acute events in ED.    Case and plan of care discussed with Dr. Ralene Bathe.   Final Clinical Impressions(s) / ED Diagnoses   Final diagnoses:  Hypertensive emergency    ED Discharge Orders    None       Corrie Dandy, MD 06/23/18 1544    Quintella Reichert, MD 06/29/18 [phone]

## 2018-06-24 ENCOUNTER — Other Ambulatory Visit: Payer: Self-pay | Admitting: *Deleted

## 2018-06-24 DIAGNOSIS — I161 Hypertensive emergency: Secondary | ICD-10-CM | POA: Diagnosis not present

## 2018-06-24 DIAGNOSIS — Q600 Renal agenesis, unilateral: Secondary | ICD-10-CM

## 2018-06-24 DIAGNOSIS — I739 Peripheral vascular disease, unspecified: Secondary | ICD-10-CM | POA: Diagnosis not present

## 2018-06-24 DIAGNOSIS — IMO0002 Reserved for concepts with insufficient information to code with codable children: Secondary | ICD-10-CM

## 2018-06-24 DIAGNOSIS — N185 Chronic kidney disease, stage 5: Secondary | ICD-10-CM

## 2018-06-24 DIAGNOSIS — I5022 Chronic systolic (congestive) heart failure: Secondary | ICD-10-CM

## 2018-06-24 DIAGNOSIS — R0602 Shortness of breath: Secondary | ICD-10-CM | POA: Diagnosis not present

## 2018-06-24 LAB — IRON AND TIBC
Iron: 36 ug/dL (ref 28–170)
Saturation Ratios: 13 % (ref 10.4–31.8)
TIBC: 270 ug/dL (ref 250–450)
UIBC: 234 ug/dL

## 2018-06-24 LAB — CBC
HEMATOCRIT: 29.4 % — AB (ref 36.0–46.0)
HEMOGLOBIN: 8.9 g/dL — AB (ref 12.0–15.0)
MCH: 29 pg (ref 26.0–34.0)
MCHC: 30.3 g/dL (ref 30.0–36.0)
MCV: 95.8 fL (ref 78.0–100.0)
Platelets: 241 10*3/uL (ref 150–400)
RBC: 3.07 MIL/uL — ABNORMAL LOW (ref 3.87–5.11)
RDW: 15.3 % (ref 11.5–15.5)
WBC: 6.6 10*3/uL (ref 4.0–10.5)

## 2018-06-24 LAB — COMPREHENSIVE METABOLIC PANEL
ALBUMIN: 3.4 g/dL — AB (ref 3.5–5.0)
ALK PHOS: 88 U/L (ref 38–126)
ALT: 11 U/L (ref 0–44)
AST: 12 U/L — AB (ref 15–41)
Anion gap: 14 (ref 5–15)
BUN: 56 mg/dL — AB (ref 8–23)
CALCIUM: 8.8 mg/dL — AB (ref 8.9–10.3)
CO2: 19 mmol/L — AB (ref 22–32)
Chloride: 111 mmol/L (ref 98–111)
Creatinine, Ser: 3.8 mg/dL — ABNORMAL HIGH (ref 0.44–1.00)
GFR calc Af Amer: 13 mL/min — ABNORMAL LOW (ref >60)
GFR calc non Af Amer: 11 mL/min — ABNORMAL LOW (ref >60)
GLUCOSE: 133 mg/dL — AB (ref 70–99)
Potassium: 4 mmol/L (ref 3.5–5.1)
SODIUM: 144 mmol/L (ref 135–145)
TOTAL PROTEIN: 6.5 g/dL (ref 6.5–8.1)
Total Bilirubin: 0.6 mg/dL (ref 0.3–1.2)

## 2018-06-24 LAB — FERRITIN: Ferritin: 76 ng/mL (ref 11–307)

## 2018-06-24 MED ORDER — SODIUM CHLORIDE 0.9 % IV SOLN
510.0000 mg | INTRAVENOUS | Status: DC
  Administered 2018-06-24: 510 mg via INTRAVENOUS
  Filled 2018-06-24: qty 17

## 2018-06-24 MED ORDER — DARBEPOETIN ALFA 25 MCG/0.42ML IJ SOSY
25.0000 ug | PREFILLED_SYRINGE | Freq: Once | INTRAMUSCULAR | Status: DC
  Filled 2018-06-24: qty 0.42

## 2018-06-24 NOTE — Progress Notes (Signed)
S: Feels a little better today O:BP 123/76   Pulse 90   Temp 98.1 F (36.7 C) (Oral)   Resp 20   Ht 5\' 4"  (1.626 m)   Wt 62.1 kg   SpO2 99%   BMI 23.52 kg/m   Intake/Output Summary (Last 24 hours) at 06/24/2018 1304 Last data filed at 06/24/2018 0900 Gross per 24 hour  Intake 360 ml  Output 1400 ml  Net -1040 ml   Intake/Output: I/O last 3 completed shifts: In: -  Out: 400 [Urine:400]  Intake/Output this shift:  Total I/O In: 360 [P.O.:360] Out: 1000 [Urine:1000] Weight change:  Gen: NAD CVS: no rub Resp: cta Abd: benign Ext: no edema  Recent Labs  Lab 06/23/18 1040 06/24/18 0249  NA 145 144  K 4.7 4.0  CL 118* 111  CO2 16* 19*  GLUCOSE 107* 133*  BUN 39* 56*  CREATININE 3.50* 3.80*  ALBUMIN 3.6 3.4*  CALCIUM 8.7* 8.8*  AST 18 12*  ALT 11 11   Liver Function Tests: Recent Labs  Lab 06/23/18 1040 06/24/18 0249  AST 18 12*  ALT 11 11  ALKPHOS 96 88  BILITOT 0.9 0.6  PROT 6.6 6.5  ALBUMIN 3.6 3.4*   No results for input(s): LIPASE, AMYLASE in the last 168 hours. No results for input(s): AMMONIA in the last 168 hours. CBC: Recent Labs  Lab 06/23/18 1040 06/24/18 0249  WBC 9.0 6.6  NEUTROABS 6.7  --   HGB 9.4* 8.9*  HCT 31.4* 29.4*  MCV 97.5 95.8  PLT 217 241   Cardiac Enzymes: No results for input(s): CKTOTAL, CKMB, CKMBINDEX, TROPONINI in the last 168 hours. CBG: No results for input(s): GLUCAP in the last 168 hours.  Iron Studies:  Recent Labs    06/24/18 0249  IRON 36  TIBC 270  FERRITIN 76   Studies/Results: Dg Chest Port 1 View  Result Date: 06/23/2018 CLINICAL DATA:  Shortness of breath today, history hypertension, kidney disease, LEFT lung surgery for lung cancer EXAM: PORTABLE CHEST 1 VIEW COMPARISON:  Portable exam 1050 hours compared to 06/06/2018 FINDINGS: Enlargement of cardiac silhouette with note of a coronary arterial stent. Atherosclerotic calcification aorta. Mediastinal contours and pulmonary vascularity normal.  Chronic interstitial changes in the mid to lower lungs, unchanged. Surgical clips at LEFT hilum. Minimal RIGHT basilar atelectasis. No acute infiltrate, pleural effusion or pneumothorax. No acute osseous findings. IMPRESSION: Enlargement of cardiac silhouette post coronary stenting. Chronic interstitial changes at the mid to lower lungs with mild RIGHT basilar atelectasis. Electronically Signed   By: Lavonia Dana M.D.   On: 06/23/2018 11:27   . amLODipine  10 mg Oral Daily  . aspirin EC  81 mg Oral Daily  . atorvastatin  40 mg Oral Daily  . buPROPion  150 mg Oral BID  . cholecalciferol  2,000 Units Oral Daily  . clopidogrel  75 mg Oral Daily  . feeding supplement (ENSURE ENLIVE)  237 mL Oral BID BM  . fluticasone furoate-vilanterol  1 puff Inhalation Daily  . furosemide  60 mg Intravenous BID  . hydrALAZINE  50 mg Oral TID  . isosorbide mononitrate  30 mg Oral Daily  . metoprolol succinate  50 mg Oral Daily  . sodium bicarbonate  650 mg Oral BID  . sodium chloride flush  3 mL Intravenous Q12H  . umeclidinium bromide  1 puff Inhalation Daily    BMET    Component Value Date/Time   NA 144 06/24/2018 0249   K 4.0 06/24/2018  0249   CL 111 06/24/2018 0249   CO2 19 (L) 06/24/2018 0249   GLUCOSE 133 (H) 06/24/2018 0249   BUN 56 (H) 06/24/2018 0249   CREATININE 3.80 (H) 06/24/2018 0249   CALCIUM 8.8 (L) 06/24/2018 0249   GFRNONAA 11 (L) 06/24/2018 0249   GFRAA 13 (L) 06/24/2018 0249   CBC    Component Value Date/Time   WBC 6.6 06/24/2018 0249   RBC 3.07 (L) 06/24/2018 0249   HGB 8.9 (L) 06/24/2018 0249   HCT 29.4 (L) 06/24/2018 0249   PLT 241 06/24/2018 0249   MCV 95.8 06/24/2018 0249   MCH 29.0 06/24/2018 0249   MCHC 30.3 06/24/2018 0249   RDW 15.3 06/24/2018 0249   LYMPHSABS 1.4 06/23/2018 1040   MONOABS 0.6 06/23/2018 1040   EOSABS 0.2 06/23/2018 1040   BASOSABS 0.1 06/23/2018 1040   60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  Cr stable and without uremic symptoms.  No indication for dialysis at this time. 2. HTN- acute and severe due to noncompliance medications.  Markedly improved 3. PD catheter- not being used and will need to be removed, however she has moved to [facility] and is unable to get to [facility] for removal.  Surgery was consulted for this, however they are waiting. 4. Vascular access- she has agreed to AVF/AVG placement on Monday by [facility] 5. Anemia of CKD- will order IV Iron and follow but will likely require ESA.  Donetta Potts, MD [employer] [phone]

## 2018-06-24 NOTE — Discharge Instructions (Signed)
You will need to return to the surgeon who placed your Peritoneal dialysis catheter to have it removed as soon as possible.  Please take all your medications as ordered.     You were cared for by a hospitalist during your hospital stay. If you have any questions about your discharge medications or the care you received while you were in the hospital after you are discharged, you can call the unit and asked to speak with the hospitalist on call if the hospitalist that took care of you is not available. Once you are discharged, your primary care physician will handle any further medical issues.   Please note that NO REFILLS for any discharge medications will be authorized once you are discharged, as it is imperative that you return to your primary care physician (or establish a relationship with a primary care physician if you do not have one) for your aftercare needs so that they can reassess your need for medications and monitor your lab values.  Please take all your medications with you for your next visit with your Primary MD. Please ask your Primary MD to get all Hospital records sent to his/her office. Please request your Primary MD to go over all hospital test results at the follow up.   If you experience worsening of your admission symptoms, develop shortness of breath, chest pain, suicidal or homicidal thoughts or a life threatening emergency, you must seek medical attention immediately by calling 911 or calling your MD.   Dennis Bast must read the complete instructions/literature along with all the possible adverse reactions/side effects for all the medicines you take including new medications that have been prescribed to you. Take new medicines after you have completely understood and accpet all the possible adverse reactions/side effects.    Do not drive when taking pain medications or sedatives.     Do not take more than prescribed Pain, Sleep and Anxiety Medications   If you have smoked or  chewed Tobacco in the last 2 yrs please stop. Stop any regular alcohol  and or recreational drug use.   Wear Seat belts while driving.

## 2018-06-24 NOTE — Progress Notes (Signed)
AVS can not be printed due to code 44.  Paged case management on call multiple times and text paged Dr Wynelle Cleveland.

## 2018-06-24 NOTE — Discharge Summary (Signed)
Physician Discharge Summary  Kari Robinson DGL:[id number] DOB: 1951/08/11 DOA: 06/23/2018  PCP: Concepcion Elk, MD  Admit date: 06/23/2018 Discharge date: 06/24/2018  Admitted From: home Disposition:  home   Recommendations for Outpatient Follow-up:  1. Need PD catheter removed-     Discharge Condition:  stable   CODE STATUS:  Full code   Consultations:  Nephrology  Vascular surgery    Discharge Diagnoses:  Principal Problem:   Hypertensive emergency Active Problems:   CKD (chronic kidney disease) stage 5, GFR less than 15 ml/min ([name])   Coronary atherosclerosis   RENAL ARTERY STENOSIS   PERIPHERAL VASCULAR DISEASE   DM (diabetes mellitus), type 2 with renal complications ([name])   Solitary right kidney   Chronic systolic CHF (congestive heart failure) ([place])    Subjective: She has no complaints of dyspnea, chest pain. She has a cough but states this is chronic and related to COPD.   Brief Summary: Kari Robinson 66 HTN, CKD 5 on peritoneal dialysis who has not been able to tolerate the dialysis. It was discontinued in May and she has been without dialysis since. She presents with dyspnea and was found to be hypoxic with a pulse ox of 67% on room air (by EMS). SBP also noted to be in 200s. She admitted that she had forgotten to take her antihypertensives. Given Nitro by EMS and started on BiPAP. In ED pulse ox noted to be 97% on room air after BiPAP removed.  Admitted for hypertensive emergency.  CXR in ED: Enlargement of cardiac silhouette post coronary stenting. Chronic interstitial changes at the mid to lower lungs with mild RIGHT basilar atelectasis  She was last admitted from 7/22-7/25 with HTN emergency as well.   Hospital Course:  Principal Problem:   Hypertensive emergency - home meds: Norvasc, hydralazine, Imdur, Toprol, Demadex - all have been resumed and BP has normalized- she is advised to adhere to her medication regimen- this is her second admission for the same  issue- she states she does not need refills and has all of her necessary medications at home  Active Problems:  Dyspnea/ hypoxia - ? If related to a COPD exacerbation- CXR was not suggestive of pulm edema but she did receive IV Lasix and IV Solumedrol yesterday- I doubt her hypoxia would have resolved just in route to the ED and reading may have been erroneous. - at this point, she has not on steroids, is not wheezing or dyspneic and therefore does not need further IV Lasix or Solumedrol- I do advised to her continue her oral Demadex and her COPD medications as prescribed - note that she has a cough but tells me this is chronic and unchanged from her usual cough- no other signs of acute infection such as leukocytosis or fevers    CKD (chronic kidney disease) stage 5, GFR less than 15 ml/min  - Solitary right kidney - she has no emergent needs for dialysis per Nephrology but does need access placed - plans to undergo placement AV fistula on Monday per Dr Lianne Moris note- I have spoken with Dr Bridgett Larsson to make him aware that the patient is ready for d/c from the hospital- he states that he will call his office to arrange oupt placement of Fistula- I have communicated this to the patient as well   - she is scheduled for elective removal of PD on 8/12 per Gen surgery notes- the patient denies this today- non-the-less I have asked her to f/u with the her original surgeon (per  request from [facility]) to have it removed - appreciate nephro and vasc surgery consults - cont Bicarb tabs  Chronic systolic CHF - EF 93-23% with grade 1 d CHF - will need to ensure BP is controlled and she is taking her Demadex appropriately     Coronary atherosclerosis   Right RAS - cont Plavix and Aspirin  COPD - stable- no wheezing noted - she is on Trelegy Ellipta, Duonebs and Albuterol inhlaer   Discharge Exam: Vitals:   06/24/18 0910 06/24/18 1028  BP:  123/76  Pulse:    Resp:    Temp:    SpO2:  99%    Vitals:   06/23/18 2333 06/24/18 0905 06/24/18 0910 06/24/18 1028  BP: (!) 133/93 (!) 133/92  123/76  Pulse: 97 90    Resp: 16 20    Temp: 98.3 F (36.8 C) 98.1 F (36.7 C)    TempSrc: Oral Oral    SpO2: 98% 100% 99%   Weight:      Height:        General: Pt is alert, awake, not in acute distress Cardiovascular: RRR, S1/S2 +, no rubs, no gallops Respiratory: CTA bilaterally, no wheezing, no rhonchi Abdominal: Soft, NT, ND, bowel sounds +- PD catheter is intact.  Extremities: no edema, no cyanosis   Discharge Instructions  Discharge Instructions    Diet - low sodium heart healthy   Complete by:  As directed    Increase activity slowly   Complete by:  As directed      Allergies as of 06/24/2018      Reactions   Citalopram Hydrobromide Hives   Morphine Itching      Medication List    TAKE these medications   albuterol 108 (90 Base) MCG/ACT inhaler Commonly known as:  PROVENTIL HFA;VENTOLIN HFA Inhale 4 puffs into the lungs every 4 (four) hours as needed for wheezing.   ALPRAZolam 0.25 MG tablet Commonly known as:  XANAX Take 0.25 mg by mouth at bedtime as needed for sleep.   amLODipine 10 MG tablet Commonly known as:  NORVASC Take 10 mg by mouth daily.   aspirin 81 MG tablet Take 81 mg by mouth daily.   atorvastatin 40 MG tablet Commonly known as:  LIPITOR Take 40 mg by mouth daily.   buPROPion 150 MG 12 hr tablet Commonly known as:  WELLBUTRIN SR Take 150 mg by mouth 2 (two) times daily.   clopidogrel 75 MG tablet Commonly known as:  PLAVIX Take 75 mg by mouth daily.   hydrALAZINE 50 MG tablet Commonly known as:  APRESOLINE Take 50 mg by mouth 3 (three) times daily.   ipratropium-albuterol 0.5-2.5 (3) MG/3ML Soln Commonly known as:  DUONEB Take 3 mLs by nebulization every 6 (six) hours as needed. What changed:  reasons to take this   isosorbide mononitrate 30 MG 24 hr tablet Commonly known as:  IMDUR Take 30 mg by mouth daily.    metoprolol succinate 50 MG 24 hr tablet Commonly known as:  TOPROL-XL Take 50 mg by mouth daily. Take with or immediately following a meal.   sodium bicarbonate 650 MG tablet Take 1 tablet (650 mg total) by mouth 2 (two) times daily.   torsemide 20 MG tablet Commonly known as:  DEMADEX Take 2 tablets (40 mg total) by mouth daily.   TRELEGY ELLIPTA 100-62.5-25 MCG/INH Aepb Generic drug:  Fluticasone-Umeclidin-Vilant Inhale 1 puff into the lungs daily.   Vitamin D 2000 units tablet Take 2,000 Units by  mouth daily.       Allergies  Allergen Reactions  . Citalopram Hydrobromide Hives  . Morphine Itching     Procedures/Studies:    Dg Chest 2 View  Result Date: 06/06/2018 CLINICAL DATA:  Shortness of breath EXAM: CHEST - 2 VIEW COMPARISON:  11/17/2017, 09/24/2017 FINDINGS: Hyperinflation with emphysematous disease. Chronic pleural scarring at the left base. Chronic interstitial disease in the lower lobes. No acute opacity. Stable cardiomediastinal silhouette with aortic atherosclerosis. No pneumothorax. Carotid vascular calcification IMPRESSION: No active cardiopulmonary disease. Emphysematous disease with chronic interstitial changes at the bases and scarring at the left CP angle. Electronically Signed   By: Donavan Foil M.D.   On: 06/06/2018 22:25   Dg Chest Port 1 View  Result Date: 06/23/2018 CLINICAL DATA:  Shortness of breath today, history hypertension, kidney disease, LEFT lung surgery for lung cancer EXAM: PORTABLE CHEST 1 VIEW COMPARISON:  Portable exam 1050 hours compared to 06/06/2018 FINDINGS: Enlargement of cardiac silhouette with note of a coronary arterial stent. Atherosclerotic calcification aorta. Mediastinal contours and pulmonary vascularity normal. Chronic interstitial changes in the mid to lower lungs, unchanged. Surgical clips at LEFT hilum. Minimal RIGHT basilar atelectasis. No acute infiltrate, pleural effusion or pneumothorax. No acute osseous findings.  IMPRESSION: Enlargement of cardiac silhouette post coronary stenting. Chronic interstitial changes at the mid to lower lungs with mild RIGHT basilar atelectasis. Electronically Signed   By: Lavonia Dana M.D.   On: 06/23/2018 11:27     The results of significant diagnostics from this hospitalization (including imaging, microbiology, ancillary and laboratory) are listed below for reference.     Microbiology: No results found for this or any previous visit (from the past 240 hour(s)).   Labs: BNP (last 3 results) Recent Labs    06/06/18 2236 06/23/18 1040  BNP 2,726.8* 7,673.4*   Basic Metabolic Panel: Recent Labs  Lab 06/23/18 1040 06/24/18 0249  NA 145 144  K 4.7 4.0  CL 118* 111  CO2 16* 19*  GLUCOSE 107* 133*  BUN 39* 56*  CREATININE 3.50* 3.80*  CALCIUM 8.7* 8.8*   Liver Function Tests: Recent Labs  Lab 06/23/18 1040 06/24/18 0249  AST 18 12*  ALT 11 11  ALKPHOS 96 88  BILITOT 0.9 0.6  PROT 6.6 6.5  ALBUMIN 3.6 3.4*   No results for input(s): LIPASE, AMYLASE in the last 168 hours. No results for input(s): AMMONIA in the last 168 hours. CBC: Recent Labs  Lab 06/23/18 1040 06/24/18 0249  WBC 9.0 6.6  NEUTROABS 6.7  --   HGB 9.4* 8.9*  HCT 31.4* 29.4*  MCV 97.5 95.8  PLT 217 241   Cardiac Enzymes: No results for input(s): CKTOTAL, CKMB, CKMBINDEX, TROPONINI in the last 168 hours. BNP: Invalid input(s): POCBNP CBG: No results for input(s): GLUCAP in the last 168 hours. D-Dimer No results for input(s): DDIMER in the last 72 hours. Hgb A1c No results for input(s): HGBA1C in the last 72 hours. Lipid Profile No results for input(s): CHOL, HDL, LDLCALC, TRIG, CHOLHDL, LDLDIRECT in the last 72 hours. Thyroid function studies No results for input(s): TSH, T4TOTAL, T3FREE, THYROIDAB in the last 72 hours.  Invalid input(s): FREET3 Anemia work up Recent Labs    06/24/18 0249  FERRITIN 76  TIBC 270  IRON 36   Urinalysis    Component Value Date/Time    COLORURINE YELLOW 06/06/2018 [place] 06/06/2018 2340   LABSPEC 1.013 06/06/2018 2340   PHURINE 5.0 06/06/2018 2340   GLUCOSEU  NEGATIVE 06/06/2018 2340   HGBUR NEGATIVE 06/06/2018 2340   BILIRUBINUR NEGATIVE 06/06/2018 2340   KETONESUR NEGATIVE 06/06/2018 2340   PROTEINUR >=300 (A) 06/06/2018 2340   NITRITE NEGATIVE 06/06/2018 2340   LEUKOCYTESUR NEGATIVE 06/06/2018 2340   Sepsis Labs Invalid input(s): PROCALCITONIN,  WBC,  LACTICIDVEN Microbiology No results found for this or any previous visit (from the past 240 hour(s)).   Time coordinating discharge in minutes: 60  SIGNED:   Debbe Odea, MD  [facility] 06/24/2018, 1:46 PM Pager   If 7PM-7AM, please contact night-coverage [email or web address] Password [email or web address]

## 2018-06-24 NOTE — Progress Notes (Signed)
Nutrition Brief Note  Patient identified on the Malnutrition Screening Tool (MST) Report.  Wt Readings from Last 15 Encounters:  06/23/18 62.1 kg  06/10/18 62.8 kg  01/19/12 86.6 kg  12/09/10 87.7 kg  09/03/10 86.2 kg  09/03/10 86.2 kg   Body mass index is 23.52 kg/m. Patient meets criteria for Normal based on current BMI.   Current diet order is Heart Healthy/Carbohydrate Modified, patient is consuming approximately 100% of meals at this time. Labs and medications reviewed.   No nutrition interventions warranted at this time. If nutrition issues arise, please consult RD.   Arthur Holms, RD, LDN Pager #: [phone] After-Hours Pager #: [phone]

## 2018-06-24 NOTE — Progress Notes (Signed)
Consult to Spiritual Care Acknowledged. Advanced Directive Education offered.  Advanced Directive has been completed, witnessed and duly notarized.

## 2018-06-25 DIAGNOSIS — I161 Hypertensive emergency: Secondary | ICD-10-CM | POA: Diagnosis not present

## 2018-06-25 DIAGNOSIS — R0602 Shortness of breath: Secondary | ICD-10-CM | POA: Diagnosis not present

## 2018-06-25 LAB — RENAL FUNCTION PANEL
ANION GAP: 13 (ref 5–15)
Albumin: 3.3 g/dL — ABNORMAL LOW (ref 3.5–5.0)
BUN: 75 mg/dL — ABNORMAL HIGH (ref 8–23)
CALCIUM: 8.2 mg/dL — AB (ref 8.9–10.3)
CHLORIDE: 107 mmol/L (ref 98–111)
CO2: 21 mmol/L — ABNORMAL LOW (ref 22–32)
Creatinine, Ser: 4.12 mg/dL — ABNORMAL HIGH (ref 0.44–1.00)
GFR, EST AFRICAN AMERICAN: 12 mL/min — AB (ref >60)
GFR, EST NON AFRICAN AMERICAN: 10 mL/min — AB (ref >60)
Glucose, Bld: 127 mg/dL — ABNORMAL HIGH (ref 70–99)
Phosphorus: 3.7 mg/dL (ref 2.5–4.6)
Potassium: 3.6 mmol/L (ref 3.5–5.1)
Sodium: 141 mmol/L (ref 135–145)

## 2018-06-25 LAB — CBC
HCT: 29.7 % — ABNORMAL LOW (ref 36.0–46.0)
HEMOGLOBIN: 9 g/dL — AB (ref 12.0–15.0)
MCH: 28.7 pg (ref 26.0–34.0)
MCHC: 30.3 g/dL (ref 30.0–36.0)
MCV: 94.6 fL (ref 78.0–100.0)
PLATELETS: 237 10*3/uL (ref 150–400)
RBC: 3.14 MIL/uL — AB (ref 3.87–5.11)
RDW: 15.4 % (ref 11.5–15.5)
WBC: 8.7 10*3/uL (ref 4.0–10.5)

## 2018-06-25 NOTE — Progress Notes (Signed)
[facility]  The patient was not discharged yesterday as RN could not contact case manager in regards to code 44. I have examined her today and she remains stable for discharge. I have discussed with the RN to contact case manager on call as she has not yet done this today.   Debbe Odea, MD Pager: [email or web address]

## 2018-06-27 ENCOUNTER — Ambulatory Visit: Admit: 2018-06-27 | Payer: Medicare HMO | Admitting: Vascular Surgery

## 2018-06-27 SURGERY — ARTERIOVENOUS (AV) FISTULA CREATION
Anesthesia: Monitor Anesthesia Care | Laterality: Right

## 2018-07-01 ENCOUNTER — Encounter: Payer: Self-pay | Admitting: *Deleted

## 2018-07-01 ENCOUNTER — Ambulatory Visit: Payer: Medicare HMO

## 2018-07-08 ENCOUNTER — Other Ambulatory Visit: Payer: Self-pay | Admitting: Vascular Surgery

## 2018-07-08 NOTE — Progress Notes (Signed)
Anesthesia Chart Review: SAME DAY WORKUP   Case:  [id number] Date/Time:  07/11/18 0715   Procedure:  ARTERIOVENOUS (AV) FISTULA CREATION (Right )   Anesthesia type:  Monitor Anesthesia Care   Pre-op diagnosis:  chronic kidney disease   Location:  [facility] OR ROOM 58 / [facility]   Surgeon:  [name], MD      DISCUSSION:66 yo female current smoker for above procedure. Pertinent hx includes Depression, Systolic CHF (EF 14-43% by Echo 2019), COPD, HTN, Hx of ETOH abuse, MI/CAD (3 stents in [place] in 2005 and 2016--NSTEMI in Nov 2018 medical Tx. Stable no cp), s/p left lung resection 2017 in [place].  Pt previously on peritoneal dialysis but could not tolerate and discontinued un May. She has had two recent admissions (7/22-7/25/19 and 8/8-06/24/18) for SOB and acute severe HTN due to medication noncompliance.  Anticipate she can proceed as scheduled barring acute status change and DOS labs acceptable.  VS: There were no vitals taken for this visit.  PROVIDERS: Concepcion Elk, MD is PCP  Worthy Keeler, PA-C provides pulmonary care last seen 12/16/2017  [facility] provides Nephrology care  Towana Badger, MD is Cardiologist last seen 04/04/2018  LABS: Most recent labs listed. Will need DOS istat4.  Ref. Range 06/25/2018 02:55  Sodium Latest Ref Range: 135 - 145 mmol/L 141  Potassium Latest Ref Range: 3.5 - 5.1 mmol/L 3.6  Chloride Latest Ref Range: 98 - 111 mmol/L 107  CO2 Latest Ref Range: 22 - 32 mmol/L 21 (L)  Glucose Latest Ref Range: 70 - 99 mg/dL 127 (H)  BUN Latest Ref Range: 8 - 23 mg/dL 75 (H)  Creatinine Latest Ref Range: 0.44 - 1.00 mg/dL 4.12 (H)  Calcium Latest Ref Range: 8.9 - 10.3 mg/dL 8.2 (L)  Anion gap Latest Ref Range: 5 - 15  13  Phosphorus Latest Ref Range: 2.5 - 4.6 mg/dL 3.7  Albumin Latest Ref Range: 3.5 - 5.0 g/dL 3.3 (L)  GFR, Est Non African American Latest Ref Range: >60 mL/min 10 (L)  GFR, Est African American Latest Ref Range: >60 mL/min 12 (L)   WBC Latest Ref Range: 4.0 - 10.5 K/uL 8.7  RBC Latest Ref Range: 3.87 - 5.11 MIL/uL 3.14 (L)  Hemoglobin Latest Ref Range: 12.0 - 15.0 g/dL 9.0 (L)  HCT Latest Ref Range: 36.0 - 46.0 % 29.7 (L)  MCV Latest Ref Range: 78.0 - 100.0 fL 94.6  MCH Latest Ref Range: 26.0 - 34.0 pg 28.7  MCHC Latest Ref Range: 30.0 - 36.0 g/dL 30.3  RDW Latest Ref Range: 11.5 - 15.5 % 15.4  Platelets Latest Ref Range: 150 - 400 K/uL 237      IMAGES: PORTABLE CHEST 1 VIEW 06/23/2018  COMPARISON:  Portable exam 1050 hours compared to 06/06/2018  FINDINGS: Enlargement of cardiac silhouette with note of a coronary arterial stent.  Atherosclerotic calcification aorta.  Mediastinal contours and pulmonary vascularity normal.  Chronic interstitial changes in the mid to lower lungs, unchanged.  Surgical clips at LEFT hilum.  Minimal RIGHT basilar atelectasis.  No acute infiltrate, pleural effusion or pneumothorax.  No acute osseous findings.  IMPRESSION: Enlargement of cardiac silhouette post coronary stenting.  Chronic interstitial changes at the mid to lower lungs with mild RIGHT basilar atelectasis.   EKG: 06/23/2018: Sinus tachycardia. Paired ventricular premature complexes. Borderline right axis deviation. LVH with secondary repolarization abnormality. ST depression, consider ischemia, diffuse lds  CV: Echo 06/07/2018 Study Conclusions  - Left ventricle: The cavity size was normal.  Wall thickness was   increased in a pattern of mild LVH. Systolic function was mildly   to moderately reduced. The estimated ejection fraction was in the   range of 40% to 45%. Global hypokinesis worse in the   anterolateral and inferolateral myocardium. Doppler parameters   are consistent with abnormal left ventricular relaxation (grade 1   diastolic dysfunction). - Aortic valve: Transvalvular velocity was within the normal range.   There was no stenosis. There was trivial regurgitation. - Mitral  valve: Transvalvular velocity was within the normal range.   There was no evidence for stenosis. There was mild regurgitation. - Right ventricle: Systolic function was normal. - Atrial septum: No defect or patent foramen ovale was identified   by color flow Doppler. - Tricuspid valve: There was trivial regurgitation. - Pulmonary arteries: Systolic pressure was within the normal   range. PA peak pressure: 16 mm Hg (S).   Past Medical History:  Diagnosis Date  . Acute on chronic heart failure ([place])   . Acute respiratory failure with hypoxia ([place])   . Alcohol abuse   . Arthralgia   . CAD (coronary artery disease)    moderate primarily septal perforater  . Depression   . HTN (hypertension)   . Hypercholesterolemia    primarily ldl-p and small particles  . RAS (renal artery stenosis) ([name]) 2002   by cath tysinger  . Smoking     Past Surgical History:  Procedure Laterality Date  . abdominal aortogram     perclose of the right femoral artery  . CARDIAC CATHETERIZATION     left heart catheterization.  Coronary cineangiography. Lft ventricular cineangiography.    . TUBAL LIGATION      MEDICATIONS: No current facility-administered medications for this encounter.    [name] albuterol (PROVENTIL HFA;VENTOLIN HFA) 108 (90 Base) MCG/ACT inhaler  . ALPRAZolam (XANAX) 0.25 MG tablet  . amLODipine (NORVASC) 10 MG tablet  . aspirin 81 MG tablet  . atorvastatin (LIPITOR) 40 MG tablet  . buPROPion (WELLBUTRIN SR) 150 MG 12 hr tablet  . Cholecalciferol (VITAMIN D) 2000 units tablet  . clopidogrel (PLAVIX) 75 MG tablet  . Fluticasone-Umeclidin-Vilant (TRELEGY ELLIPTA) 100-62.5-25 MCG/INH AEPB  . hydrALAZINE (APRESOLINE) 50 MG tablet  . ipratropium-albuterol (DUONEB) 0.5-2.5 (3) MG/3ML SOLN  . isosorbide mononitrate (IMDUR) 30 MG 24 hr tablet  . metoprolol succinate (TOPROL-XL) 50 MG 24 hr tablet  . sodium bicarbonate 650 MG tablet  . torsemide (DEMADEX) 20 MG tablet     Wynonia Musty [facility] Stay [facility]/Anesthesiology Phone [phone] 07/08/2018 11:34 AM

## 2018-07-08 NOTE — Progress Notes (Signed)
Several unsuccessful attempts were made to contact pt. Emergency contact person stated " she is there in [place] and I am in [place] and I have not been able to reach her either."  Left pre-op instructions according to pre-op checklist. Pt made aware to stop taking vitamins, fish oil herbal medications. Do not take any NSAIDs ie: Ibuprofen, Advil, Naproxen (Aleve), Motrin, BC and Goody Powder. Left message making pt aware to follow surgeon's instructions regarding Plavix. Anesthesia asked to review pt history ( see note).

## 2018-07-11 ENCOUNTER — Ambulatory Visit (HOSPITAL_COMMUNITY)
Admission: RE | Admit: 2018-07-11 | Discharge: 2018-07-11 | Disposition: A | Discharge status: Home / Self Care | Payer: Medicare HMO | Source: Ambulatory Visit | Attending: Vascular Surgery | Admitting: Vascular Surgery

## 2018-07-11 ENCOUNTER — Other Ambulatory Visit: Payer: Self-pay

## 2018-07-11 ENCOUNTER — Ambulatory Visit (HOSPITAL_COMMUNITY): Payer: Medicare HMO | Admitting: Physician Assistant

## 2018-07-11 ENCOUNTER — Encounter (HOSPITAL_COMMUNITY): Payer: Self-pay

## 2018-07-11 ENCOUNTER — Encounter (HOSPITAL_COMMUNITY)
Admission: RE | Disposition: A | Discharge status: Home / Self Care | Payer: Self-pay | Source: Ambulatory Visit | Attending: Vascular Surgery

## 2018-07-11 DIAGNOSIS — Z888 Allergy status to other drugs, medicaments and biological substances status: Secondary | ICD-10-CM | POA: Diagnosis not present

## 2018-07-11 DIAGNOSIS — Z825 Family history of asthma and other chronic lower respiratory diseases: Secondary | ICD-10-CM | POA: Diagnosis not present

## 2018-07-11 DIAGNOSIS — F17210 Nicotine dependence, cigarettes, uncomplicated: Secondary | ICD-10-CM | POA: Diagnosis not present

## 2018-07-11 DIAGNOSIS — E7800 Pure hypercholesterolemia, unspecified: Secondary | ICD-10-CM | POA: Insufficient documentation

## 2018-07-11 DIAGNOSIS — N186 End stage renal disease: Secondary | ICD-10-CM | POA: Diagnosis present

## 2018-07-11 DIAGNOSIS — I132 Hypertensive heart and chronic kidney disease with heart failure and with stage 5 chronic kidney disease, or end stage renal disease: Secondary | ICD-10-CM | POA: Insufficient documentation

## 2018-07-11 DIAGNOSIS — Z885 Allergy status to narcotic agent status: Secondary | ICD-10-CM | POA: Diagnosis not present

## 2018-07-11 DIAGNOSIS — Z79899 Other long term (current) drug therapy: Secondary | ICD-10-CM | POA: Diagnosis not present

## 2018-07-11 DIAGNOSIS — Z7951 Long term (current) use of inhaled steroids: Secondary | ICD-10-CM | POA: Diagnosis not present

## 2018-07-11 DIAGNOSIS — I2510 Atherosclerotic heart disease of native coronary artery without angina pectoris: Secondary | ICD-10-CM | POA: Diagnosis not present

## 2018-07-11 DIAGNOSIS — N185 Chronic kidney disease, stage 5: Secondary | ICD-10-CM

## 2018-07-11 DIAGNOSIS — Z992 Dependence on renal dialysis: Secondary | ICD-10-CM | POA: Diagnosis not present

## 2018-07-11 DIAGNOSIS — I509 Heart failure, unspecified: Secondary | ICD-10-CM | POA: Insufficient documentation

## 2018-07-11 DIAGNOSIS — M2550 Pain in unspecified joint: Secondary | ICD-10-CM | POA: Diagnosis not present

## 2018-07-11 DIAGNOSIS — F329 Major depressive disorder, single episode, unspecified: Secondary | ICD-10-CM | POA: Diagnosis not present

## 2018-07-11 HISTORY — PX: AV FISTULA PLACEMENT: SHX1204

## 2018-07-11 LAB — POCT I-STAT 4, (NA,K, GLUC, HGB,HCT)
GLUCOSE: 96 mg/dL (ref 70–99)
HCT: 28 % — ABNORMAL LOW (ref 36.0–46.0)
Hemoglobin: 9.5 g/dL — ABNORMAL LOW (ref 12.0–15.0)
Potassium: 4.7 mmol/L (ref 3.5–5.1)
Sodium: 143 mmol/L (ref 135–145)

## 2018-07-11 LAB — HEMOGLOBIN A1C
HEMOGLOBIN A1C: 5.5 % (ref 4.8–5.6)
MEAN PLASMA GLUCOSE: 111.15 mg/dL

## 2018-07-11 LAB — GLUCOSE, CAPILLARY: Glucose-Capillary: 105 mg/dL — ABNORMAL HIGH (ref 70–99)

## 2018-07-11 SURGERY — ARTERIOVENOUS (AV) FISTULA CREATION
Anesthesia: Monitor Anesthesia Care | Site: Arm Lower | Laterality: Right

## 2018-07-11 MED ORDER — SODIUM CHLORIDE 0.9 % IV SOLN
INTRAVENOUS | Status: DC | PRN
  Administered 2018-07-11: 07:00:00 via INTRAVENOUS

## 2018-07-11 MED ORDER — HEPARIN SODIUM (PORCINE) 1000 UNIT/ML IJ SOLN
INTRAMUSCULAR | Status: AC
  Filled 2018-07-11: qty 1

## 2018-07-11 MED ORDER — LIDOCAINE HCL (PF) 1 % IJ SOLN
INTRAMUSCULAR | Status: AC
  Filled 2018-07-11: qty 30

## 2018-07-11 MED ORDER — LACTATED RINGERS IV SOLN: INTRAVENOUS | Status: DC | PRN

## 2018-07-11 MED ORDER — CEFAZOLIN SODIUM-DEXTROSE 2-3 GM-%(50ML) IV SOLR
INTRAVENOUS | Status: DC | PRN
  Administered 2018-07-11: 2 g via INTRAVENOUS

## 2018-07-11 MED ORDER — MIDAZOLAM HCL 5 MG/5ML IJ SOLN
INTRAMUSCULAR | Status: DC | PRN
  Administered 2018-07-11: 1 mg via INTRAVENOUS

## 2018-07-11 MED ORDER — HEPARIN SODIUM (PORCINE) 1000 UNIT/ML IJ SOLN
INTRAMUSCULAR | Status: DC | PRN
  Administered 2018-07-11: 3000 [IU] via INTRAVENOUS

## 2018-07-11 MED ORDER — FENTANYL CITRATE (PF) 100 MCG/2ML IJ SOLN
INTRAMUSCULAR | Status: DC | PRN
  Administered 2018-07-11 (×2): 50 ug via INTRAVENOUS
  Administered 2018-07-11: 25 ug via INTRAVENOUS
  Administered 2018-07-11: 50 ug via INTRAVENOUS

## 2018-07-11 MED ORDER — DEXAMETHASONE SODIUM PHOSPHATE 10 MG/ML IJ SOLN
INTRAMUSCULAR | Status: AC
  Filled 2018-07-11: qty 1

## 2018-07-11 MED ORDER — ONDANSETRON HCL 4 MG/2ML IJ SOLN
INTRAMUSCULAR | Status: AC
  Filled 2018-07-11: qty 2

## 2018-07-11 MED ORDER — MIDAZOLAM HCL 2 MG/2ML IJ SOLN
INTRAMUSCULAR | Status: AC
  Filled 2018-07-11: qty 2

## 2018-07-11 MED ORDER — METOPROLOL SUCCINATE ER 50 MG PO TB24
50.0000 mg | ORAL_TABLET | Freq: Every day | ORAL | Status: DC
  Administered 2018-07-11: 50 mg via ORAL
  Filled 2018-07-11: qty 1

## 2018-07-11 MED ORDER — 0.9 % SODIUM CHLORIDE (POUR BTL) OPTIME
TOPICAL | Status: DC | PRN
  Administered 2018-07-11: 1000 mL

## 2018-07-11 MED ORDER — ONDANSETRON HCL 4 MG/2ML IJ SOLN
INTRAMUSCULAR | Status: DC | PRN
  Administered 2018-07-11: 4 mg via INTRAVENOUS

## 2018-07-11 MED ORDER — ALBUTEROL SULFATE (2.5 MG/3ML) 0.083% IN NEBU
INHALATION_SOLUTION | RESPIRATORY_TRACT | Status: AC
  Administered 2018-07-11: 2.5 mg via RESPIRATORY_TRACT
  Filled 2018-07-11: qty 3

## 2018-07-11 MED ORDER — METOPROLOL SUCCINATE ER 25 MG PO TB24
ORAL_TABLET | ORAL | Status: AC
  Filled 2018-07-11: qty 2

## 2018-07-11 MED ORDER — FENTANYL CITRATE (PF) 100 MCG/2ML IJ SOLN: 25.0000 ug | INTRAMUSCULAR | Status: DC | PRN

## 2018-07-11 MED ORDER — ALBUTEROL SULFATE (2.5 MG/3ML) 0.083% IN NEBU
2.5000 mg | INHALATION_SOLUTION | Freq: Once | RESPIRATORY_TRACT | Status: AC
  Administered 2018-07-11: 2.5 mg via RESPIRATORY_TRACT

## 2018-07-11 MED ORDER — SODIUM CHLORIDE 0.9 % IV SOLN
INTRAVENOUS | Status: DC | PRN
  Administered 2018-07-11: 500 mL

## 2018-07-11 MED ORDER — PROPOFOL 500 MG/50ML IV EMUL
INTRAVENOUS | Status: DC | PRN
  Administered 2018-07-11: 60 ug/kg/min via INTRAVENOUS

## 2018-07-11 MED ORDER — CEFAZOLIN SODIUM 1 G IJ SOLR
INTRAMUSCULAR | Status: AC
  Filled 2018-07-11: qty 20

## 2018-07-11 MED ORDER — DEXAMETHASONE SODIUM PHOSPHATE 10 MG/ML IJ SOLN
INTRAMUSCULAR | Status: DC | PRN
  Administered 2018-07-11: 10 mg via INTRAVENOUS

## 2018-07-11 MED ORDER — OXYCODONE-ACETAMINOPHEN 5-325 MG PO TABS: 1.0000 | ORAL_TABLET | Freq: Four times a day (QID) | ORAL | 0 refills | Status: DC | PRN

## 2018-07-11 MED ORDER — FENTANYL CITRATE (PF) 250 MCG/5ML IJ SOLN
INTRAMUSCULAR | Status: AC
  Filled 2018-07-11: qty 5

## 2018-07-11 MED ORDER — LIDOCAINE HCL (PF) 1 % IJ SOLN
INTRAMUSCULAR | Status: DC | PRN
  Administered 2018-07-11: 8 mL

## 2018-07-11 MED ORDER — ONDANSETRON HCL 4 MG/2ML IJ SOLN: 4.0000 mg | Freq: Once | INTRAMUSCULAR | Status: DC | PRN

## 2018-07-11 MED ORDER — SODIUM CHLORIDE 0.9 % IV SOLN
INTRAVENOUS | Status: AC
  Filled 2018-07-11: qty 1.2

## 2018-07-11 SURGICAL SUPPLY — 35 items
ADH SKN CLS APL DERMABOND .7 (GAUZE/BANDAGES/DRESSINGS) ×1
ARMBAND PINK RESTRICT EXTREMIT (MISCELLANEOUS) ×4 IMPLANT
CANISTER SUCT 3000ML PPV (MISCELLANEOUS) ×2 IMPLANT
CANNULA VESSEL 3MM 2 BLNT TIP (CANNULA) ×2 IMPLANT
CLIP VESOCCLUDE MED 6/CT (CLIP) ×2 IMPLANT
CLIP VESOCCLUDE SM WIDE 6/CT (CLIP) ×3 IMPLANT
COVER PROBE W GEL 5X96 (DRAPES) IMPLANT
DECANTER SPIKE VIAL GLASS SM (MISCELLANEOUS) ×2 IMPLANT
DERMABOND ADVANCED (GAUZE/BANDAGES/DRESSINGS) ×1
DERMABOND ADVANCED .7 DNX12 (GAUZE/BANDAGES/DRESSINGS) ×1 IMPLANT
DRAIN PENROSE 1/4X12 LTX STRL (WOUND CARE) ×2 IMPLANT
ELECT REM PT RETURN 9FT ADLT (ELECTROSURGICAL) ×2
ELECTRODE REM PT RTRN 9FT ADLT (ELECTROSURGICAL) ×1 IMPLANT
GLOVE BIO SURGEON STRL SZ 6.5 (GLOVE) ×2 IMPLANT
GLOVE BIO SURGEON STRL SZ7 (GLOVE) ×1 IMPLANT
GLOVE BIO SURGEON STRL SZ7.5 (GLOVE) ×2 IMPLANT
GLOVE BIOGEL PI IND STRL 7.0 (GLOVE) IMPLANT
GLOVE BIOGEL PI INDICATOR 7.0 (GLOVE) ×1
GOWN STRL REUS W/ TWL LRG LVL3 (GOWN DISPOSABLE) ×3 IMPLANT
GOWN STRL REUS W/TWL LRG LVL3 (GOWN DISPOSABLE) ×6
KIT BASIN OR (CUSTOM PROCEDURE TRAY) ×2 IMPLANT
KIT TURNOVER KIT B (KITS) ×2 IMPLANT
LOOP VESSEL MINI RED (MISCELLANEOUS) IMPLANT
NS IRRIG 1000ML POUR BTL (IV SOLUTION) ×2 IMPLANT
PACK CV ACCESS (CUSTOM PROCEDURE TRAY) ×2 IMPLANT
PAD ARMBOARD 7.5X6 YLW CONV (MISCELLANEOUS) ×4 IMPLANT
SPONGE SURGIFOAM ABS GEL 100 (HEMOSTASIS) IMPLANT
SUT PROLENE 6 0 BV (SUTURE) ×3 IMPLANT
SUT PROLENE 7 0 BV 1 (SUTURE) ×4 IMPLANT
SUT VIC AB 3-0 SH 27 (SUTURE) ×2
SUT VIC AB 3-0 SH 27X BRD (SUTURE) ×1 IMPLANT
SUT VICRYL 4-0 PS2 18IN ABS (SUTURE) ×2 IMPLANT
TOWEL GREEN STERILE (TOWEL DISPOSABLE) ×2 IMPLANT
UNDERPAD 30X30 (UNDERPADS AND DIAPERS) ×2 IMPLANT
WATER STERILE IRR 1000ML POUR (IV SOLUTION) ×2 IMPLANT

## 2018-07-11 NOTE — Interval H&P Note (Signed)
History and Physical Interval Note:  07/11/2018 6:59 AM  Kari Robinson  has presented today for surgery, with the diagnosis of chronic kidney disease  The various methods of treatment have been discussed with the patient and family. After consideration of risks, benefits and other options for treatment, the patient has consented to  Procedure(s): ARTERIOVENOUS (AV) FISTULA CREATION (Right) as a surgical intervention .  The patient's history has been reviewed, patient examined, no change in status, stable for surgery.  I have reviewed the patient's chart and labs.  Questions were answered to the patient's satisfaction.     Ruta Hinds

## 2018-07-11 NOTE — Anesthesia Preprocedure Evaluation (Signed)
Anesthesia Evaluation  Patient identified by MRN, date of birth, ID band Patient awake    Reviewed: Allergy & Precautions, NPO status , Patient's Chart, lab work & pertinent test results  Airway Mallampati: II  TM Distance: >3 FB Neck ROM: Full    Dental  (+) Edentulous Lower, Edentulous Upper   Pulmonary Current Smoker,    + rhonchi        Cardiovascular hypertension,  Rhythm:Regular Rate:Normal     Neuro/Psych    GI/Hepatic   Endo/Other    Renal/GU      Musculoskeletal   Abdominal   Peds  Hematology   Anesthesia Other Findings   Reproductive/Obstetrics                             Anesthesia Physical Anesthesia Plan  ASA: III  Anesthesia Plan: MAC   Post-op Pain Management:    Induction: Intravenous  PONV Risk Score and Plan: Ondansetron  Airway Management Planned: Natural Airway and Simple Face Mask  Additional Equipment:   Intra-op Plan:   Post-operative Plan:   Informed Consent: I have reviewed the patients History and Physical, chart, labs and discussed the procedure including the risks, benefits and alternatives for the proposed anesthesia with the patient or authorized representative who has indicated his/her understanding and acceptance.     Plan Discussed with: CRNA and Anesthesiologist  Anesthesia Plan Comments:         Anesthesia Quick Evaluation

## 2018-07-11 NOTE — Discharge Instructions (Signed)
[facility]  Discharge Instructions  AV Fistula or Graft Surgery for Dialysis Access  Please refer to the following instructions for your post-procedure care. Your surgeon or physician assistant will discuss any changes with you.  Activity  You may drive the day following your surgery, if you are comfortable and no longer taking prescription pain medication. Resume full activity as the soreness in your incision resolves.  Bathing/Showering  You may shower after you go home. Keep your incision dry for 48 hours. Do not soak in a bathtub, hot tub, or swim until the incision heals completely. You may not shower if you have a hemodialysis catheter.  Incision Care  Clean your incision with mild soap and water after 48 hours. Pat the area dry with a clean towel. You do not need a bandage unless otherwise instructed. Do not apply any ointments or creams to your incision. You may have skin glue on your incision. Do not peel it off. It will come off on its own in about one week. Your arm may swell a bit after surgery. To reduce swelling use pillows to elevate your arm so it is above your heart. Your doctor will tell you if you need to lightly wrap your arm with an ACE bandage.  Diet  Resume your normal diet. There are not special food restrictions following this procedure. In order to heal from your surgery, it is CRITICAL to get adequate nutrition. Your body requires vitamins, minerals, and protein. Vegetables are the best source of vitamins and minerals. Vegetables also provide the perfect balance of protein. Processed food has little nutritional value, so try to avoid this.  Medications  Resume taking all of your medications. If your incision is causing pain, you may take over-the counter pain relievers such as acetaminophen (Tylenol). If you were prescribed a stronger pain medication, please be aware these medications can cause nausea and constipation. Prevent  nausea by taking the medication with a snack or meal. Avoid constipation by drinking plenty of fluids and eating foods with high amount of fiber, such as fruits, vegetables, and grains.  Do not take Tylenol if you are taking prescription pain medications.  Follow up Your surgeon may want to see you in the office following your access surgery. If so, this will be arranged at the time of your surgery.  Please call us immediately for any of the following conditions:  Increased pain, redness, drainage (pus) from your incision site Fever of 101 degrees or higher Severe or worsening pain at your incision site Hand pain or numbness.  Reduce your risk of vascular disease:  Stop smoking. If you would like help, call [facility] at 1-800-QUIT-NOW (845)757-8552) or [place] at [place] your cholesterol Maintain a desired weight Control your diabetes Keep your blood pressure down  Dialysis  It will take several weeks to several months for your new dialysis access to be ready for use. Your surgeon will determine when it is okay to use it. Your nephrologist will continue to direct your dialysis. You can continue to use your Permcath until your new access is ready for use.   07/11/2018 Kari Robinson [id number] 09/05/1951  Surgeon(s): [name], MD  Procedure(s): Creation right radial cephalic AV fistula  x Do not stick fistula for 12 weeks    If you have any questions, please call the office at [phone].   Post Anesthesia Home Care Instructions  Activity: Get plenty of rest for the remainder of the  day. A responsible individual must stay with you for 24 hours following the procedure.  For the next 24 hours, DO NOT: -Drive a car -Paediatric nurse -Drink alcoholic beverages -Take any medication unless instructed by your physician -Make any legal decisions or sign important papers.  Meals: Start with liquid foods such as gelatin or soup. Progress to  regular foods as tolerated. Avoid greasy, spicy, heavy foods. If nausea and/or vomiting occur, drink only clear liquids until the nausea and/or vomiting subsides. Call your physician if vomiting continues.  Special Instructions/Symptoms: Your throat may feel dry or sore from the anesthesia or the breathing tube placed in your throat during surgery. If this causes discomfort, gargle with warm salt water. The discomfort should disappear within 24 hours.  If you had a scopolamine patch placed behind your ear for the management of post- operative nausea and/or vomiting:  1. The medication in the patch is effective for 72 hours, after which it should be removed.  Wrap patch in a tissue and discard in the trash. Wash hands thoroughly with soap and water. 2. You may remove the patch earlier than 72 hours if you experience unpleasant side effects which may include dry mouth, dizziness or visual disturbances. 3. Avoid touching the patch. Wash your hands with soap and water after contact with the patch.

## 2018-07-11 NOTE — Transfer of Care (Signed)
Immediate Anesthesia Transfer of Care Note  Patient: Kari Robinson  Procedure(s) Performed: Right arm ARTERIOVENOUS FISTULA CREATION (Right Arm Lower)  Patient Location: PACU  Anesthesia Type:MAC  Level of Consciousness: awake, alert  and oriented  Airway & Oxygen Therapy: Patient Spontanous Breathing and Patient connected to face mask oxygen  Post-op Assessment: Report given to RN and Post -op Vital signs reviewed and stable  Post vital signs: Reviewed and stable  Last Vitals:  Vitals Value Taken Time  BP 148/77 07/11/2018  9:20 AM  Temp    Pulse 65 07/11/2018  9:23 AM  Resp 15 07/11/2018  9:23 AM  SpO2 100 % 07/11/2018  9:23 AM  Vitals shown include unvalidated device data.  Last Pain:  Vitals:   07/11/18 0614  TempSrc:   PainSc: 10-Worst pain ever      Patients Stated Pain Goal: 4 (27/07/86 7544)  Complications: No apparent anesthesia complications

## 2018-07-11 NOTE — Anesthesia Postprocedure Evaluation (Signed)
Anesthesia Post Note  Patient: Kari Robinson  Procedure(s) Performed: Right arm ARTERIOVENOUS FISTULA CREATION (Right Arm Lower)     Patient location during evaluation: PACU Anesthesia Type: MAC Level of consciousness: awake and alert Pain management: pain level controlled Vital Signs Assessment: post-procedure vital signs reviewed and stable Respiratory status: spontaneous breathing, nonlabored ventilation, respiratory function stable and patient connected to nasal cannula oxygen Cardiovascular status: stable and blood pressure returned to baseline Postop Assessment: no apparent nausea or vomiting Anesthetic complications: no    Last Vitals:  Vitals:   07/11/18 0935 07/11/18 0945  BP: 140/77 (!) 129/53  Pulse: 64 60  Resp: 17 16  Temp:    SpO2: 95% 98%    Last Pain:  Vitals:   07/11/18 0945  TempSrc:   PainSc: 0-No pain                 Braylin Xu COKER

## 2018-07-11 NOTE — Op Note (Signed)
Procedure: Right Radial Cephalic AV fistula   Preop: ESRD   Postop: ESRD   Anesthesia: MAC with local   Assistant: Leontine Locket, PA-c   Findings: 2.5 mm cephalic vein       2.0 mm radial artery   Procedure Details:  The right upper extremity was prepped and draped in usual sterile fashion. Local anesthesia was infiltrated midway between the cephalic and radial artery anatomically. A longitudinal skin incision was then made in this location at the distal left forearm. The incision was carried into the subcutaneous tissues down to level cephalic vein. The vein had some spasm but was overall reasonable quality 2.5 mm. The vein was dissected free circumferentially and small side branches ligated and divided between silk ties. This was transected, gently distended with heparinized saline, spatulated, and marked for orientation. Next the radial artery was dissected free in the medial portion incision. The artery was 2.0 mm in diameter but had a reasonable pulse. There was some scar around it which made dissection tedious.  The vessel loops were placed proximal and distal to the planned site of arteriotomy. The patient was given 3000 units of intravenous heparin. After appropriate circulation time, small bulldog clamps were used to control the artery. A longitudinal opening was made in the rightt radial artery. The vein was controlled proximally with a fine bulldog clamp. The vein was then swung over to the artery and sewn end of vein to side of artery using a running 7-0 Prolene suture. Just prior to completion, the anastomosis was fore bled back bled and thoroughly flushed. The anastomosis was secured, vessel loops released, and there was a palpable thrill in the fistula immediately. After hemostasis was obtained, the skin was then closed with a 4 0 Vicryl subcuticular stitch. Dermabond was applied to the skin incision. The patient tolerated the procedure well and there were no complications. Instrument  sponge and needle count were correct at the end of the case. The patient was taken to PACU in stable condition.   Ruta Hinds, MD  [facility] [place] [facility]  Office: [phone]  Pager: [phone]

## 2018-07-12 ENCOUNTER — Telehealth: Payer: Self-pay | Admitting: Vascular Surgery

## 2018-07-12 ENCOUNTER — Encounter (HOSPITAL_COMMUNITY): Payer: Self-pay | Admitting: Vascular Surgery

## 2018-07-12 NOTE — Telephone Encounter (Signed)
sch appt spk to pt 08/25/18 230pm Dialysis Duplex 3pm p/o PA

## 2018-07-13 ENCOUNTER — Other Ambulatory Visit: Payer: Self-pay

## 2018-07-13 DIAGNOSIS — Z48812 Encounter for surgical aftercare following surgery on the circulatory system: Secondary | ICD-10-CM

## 2018-07-13 DIAGNOSIS — N185 Chronic kidney disease, stage 5: Secondary | ICD-10-CM

## 2018-08-05 ENCOUNTER — Inpatient Hospital Stay (HOSPITAL_COMMUNITY)
Admission: RE | Admit: 2018-08-05 | Discharge: 2018-08-05 | Disposition: A | Discharge status: Home / Self Care | Payer: Medicare HMO | Source: Ambulatory Visit | Attending: Nephrology | Admitting: Nephrology

## 2018-08-11 ENCOUNTER — Other Ambulatory Visit (HOSPITAL_COMMUNITY): Payer: Self-pay

## 2018-08-12 ENCOUNTER — Ambulatory Visit (HOSPITAL_COMMUNITY)
Admission: RE | Admit: 2018-08-12 | Discharge: 2018-08-12 | Disposition: A | Discharge status: Home / Self Care | Payer: Medicare HMO | Source: Ambulatory Visit | Attending: Nephrology | Admitting: Nephrology

## 2018-08-12 VITALS — BP 120/69 | HR 58 | Temp 97.5°F | Resp 20

## 2018-08-12 DIAGNOSIS — D631 Anemia in chronic kidney disease: Secondary | ICD-10-CM | POA: Insufficient documentation

## 2018-08-12 DIAGNOSIS — N185 Chronic kidney disease, stage 5: Secondary | ICD-10-CM | POA: Diagnosis not present

## 2018-08-12 LAB — POCT HEMOGLOBIN-HEMACUE: Hemoglobin: 8.1 g/dL — ABNORMAL LOW (ref 12.0–15.0)

## 2018-08-12 MED ORDER — EPOETIN ALFA-EPBX 2000 UNIT/ML IJ SOLN
2000.0000 [IU] | INTRAMUSCULAR | Status: DC
  Administered 2018-08-12: 2000 [IU] via SUBCUTANEOUS
  Filled 2018-08-12: qty 1

## 2018-08-12 MED ORDER — EPOETIN ALFA-EPBX 10000 UNIT/ML IJ SOLN: 5000.0000 [IU] | INTRAMUSCULAR | Status: DC

## 2018-08-12 MED ORDER — EPOETIN ALFA-EPBX 3000 UNIT/ML IJ SOLN
3000.0000 [IU] | INTRAMUSCULAR | Status: DC
  Administered 2018-08-12: 3000 [IU] via SUBCUTANEOUS
  Filled 2018-08-12: qty 1

## 2018-08-12 NOTE — Discharge Instructions (Signed)
Epoetin Alfa injection °What is this medicine? °EPOETIN ALFA (e POE e tin AL fa) helps your body make more red blood cells. This medicine is used to treat anemia caused by chronic kidney failure, cancer chemotherapy, or HIV-therapy. It may also be used before surgery if you have anemia. °This medicine may be used for other purposes; ask your health care provider or pharmacist if you have questions. °COMMON BRAND NAME(S): Epogen, Procrit °What should I tell my health care provider before I take this medicine? °They need to know if you have any of these conditions: °-blood clotting disorders °-cancer patient not on chemotherapy °-cystic fibrosis °-heart disease, such as angina or heart failure °-hemoglobin level of 12 g/dL or greater °-high blood pressure °-low levels of folate, iron, or vitamin B12 °-seizures °-an unusual or allergic reaction to erythropoietin, albumin, benzyl alcohol, hamster proteins, other medicines, foods, dyes, or preservatives °-pregnant or trying to get pregnant °-breast-feeding °How should I use this medicine? °This medicine is for injection into a vein or under the skin. It is usually given by a health care professional in a hospital or clinic setting. °If you get this medicine at home, you will be taught how to prepare and give this medicine. Use exactly as directed. Take your medicine at regular intervals. Do not take your medicine more often than directed. °It is important that you put your used needles and syringes in a special sharps container. Do not put them in a trash can. If you do not have a sharps container, call your pharmacist or healthcare provider to get one. °A special MedGuide will be given to you by the pharmacist with each prescription and refill. Be sure to read this information carefully each time. °Talk to your pediatrician regarding the use of this medicine in children. While this drug may be prescribed for selected conditions, precautions do apply. °Overdosage: If you  think you have taken too much of this medicine contact a poison control center or emergency room at once. °NOTE: This medicine is only for you. Do not share this medicine with others. °What if I miss a dose? °If you miss a dose, take it as soon as you can. If it is almost time for your next dose, take only that dose. Do not take double or extra doses. °What may interact with this medicine? °Do not take this medicine with any of the following medications: °-darbepoetin alfa °This list may not describe all possible interactions. Give your health care provider a list of all the medicines, herbs, non-prescription drugs, or dietary supplements you use. Also tell them if you smoke, drink alcohol, or use illegal drugs. Some items may interact with your medicine. °What should I watch for while using this medicine? °Your condition will be monitored carefully while you are receiving this medicine. °You may need blood work done while you are taking this medicine. °What side effects may I notice from receiving this medicine? °Side effects that you should report to your doctor or health care professional as soon as possible: °-allergic reactions like skin rash, itching or hives, swelling of the face, lips, or tongue °-breathing problems °-changes in vision °-chest pain °-confusion, trouble speaking or understanding °-feeling faint or lightheaded, falls °-high blood pressure °-muscle aches or pains °-pain, swelling, warmth in the leg °-rapid weight gain °-severe headaches °-sudden numbness or weakness of the face, arm or leg °-trouble walking, dizziness, loss of balance or coordination °-seizures (convulsions) °-swelling of the ankles, feet, hands °-unusually weak or tired °  Side effects that usually do not require medical attention (report to your doctor or health care professional if they continue or are bothersome): °-diarrhea °-fever, chills (flu-like symptoms) °-headaches °-nausea, vomiting °-redness, stinging, or swelling at  site where injected °This list may not describe all possible side effects. Call your doctor for medical advice about side effects. You may report side effects to FDA at 1-800-FDA-1088. °Where should I keep my medicine? °Keep out of the reach of children. °Store in a refrigerator between 2 and 8 degrees C (36 and 46 degrees F). Do not freeze or shake. Throw away any unused portion if using a single-dose vial. Multi-dose vials can be kept in the refrigerator for up to 21 days after the initial dose. Throw away unused medicine. °NOTE: This sheet is a summary. It may not cover all possible information. If you have questions about this medicine, talk to your doctor, pharmacist, or health care provider. °© 2018 Elsevier/Gold Standard (2016-06-22 19:42:31) ° °

## 2018-08-19 ENCOUNTER — Encounter (HOSPITAL_COMMUNITY): Payer: Medicare HMO

## 2018-08-25 ENCOUNTER — Other Ambulatory Visit: Payer: Self-pay

## 2018-08-25 ENCOUNTER — Ambulatory Visit (HOSPITAL_COMMUNITY)
Admission: RE | Admit: 2018-08-25 | Discharge: 2018-08-25 | Disposition: A | Discharge status: Home / Self Care | Payer: Medicare HMO | Source: Ambulatory Visit | Attending: Vascular Surgery | Admitting: Vascular Surgery

## 2018-08-25 ENCOUNTER — Ambulatory Visit (INDEPENDENT_AMBULATORY_CARE_PROVIDER_SITE_OTHER): Payer: Self-pay | Admitting: Physician Assistant

## 2018-08-25 VITALS — BP 178/96 | HR 91 | Temp 97.0°F | Resp 20 | Ht 64.0 in | Wt 140.0 lb

## 2018-08-25 DIAGNOSIS — N185 Chronic kidney disease, stage 5: Secondary | ICD-10-CM

## 2018-08-25 DIAGNOSIS — Z48812 Encounter for surgical aftercare following surgery on the circulatory system: Secondary | ICD-10-CM | POA: Diagnosis present

## 2018-08-25 NOTE — Progress Notes (Signed)
    Postoperative Access Visit   History of Present Illness   Kari Robinson is a 67 y.o. year old female who presents for postoperative follow-up for: right radiocephalic arteriovenous fistula by Dr. Oneida Alar (Date: 07/11/18).  The patient's wounds are healed.  The patient denies steal symptoms.  She had some numbness in R thumb after surgery however this has resolved.  The patient is able to complete their activities of daily living.  She is not yet on hemodialysis.  She has a follow up appointment with her Nephrologist Dr. Justin Mend tomorrow.  Physical Examination   Vitals:   08/25/18 1509  BP: (!) 178/96  Pulse: 91  Resp: 20  Temp: (!) 97 F (36.1 C)  TempSrc: Oral  SpO2: 98%  Weight: 140 lb (63.5 kg)  Height: 5\' 4"  (1.626 m)   Body mass index is 24.03 kg/m.  right arm Incision is healed, hand grip is 5/5, sensation in digits is intact, palpable thrill, bruit can be auscultated     Medical Decision Making   Kari Robinson is a 67 y.o. year old female who presents s/p right radiocephalic arteriovenous fistula   The patient's access is maturing well based on fistula duplex and my exam today  We would prefer to wait 12 weeks prior to using fistula for dialysis (12 week mark is 10/03/18)  Please notify [facility] if dialysis is required prior to 12 week mark  She may follow up on an as needed basis   Dagoberto Ligas PA-C [facility] [place] Office: [phone]

## 2018-08-26 ENCOUNTER — Encounter (HOSPITAL_COMMUNITY): Payer: Medicare HMO

## 2018-09-01 ENCOUNTER — Ambulatory Visit (HOSPITAL_COMMUNITY)
Admission: RE | Admit: 2018-09-01 | Discharge: 2018-09-01 | Disposition: A | Discharge status: Home / Self Care | Payer: Medicare HMO | Source: Ambulatory Visit | Attending: Nephrology | Admitting: Nephrology

## 2018-09-01 VITALS — BP 109/60 | HR 65

## 2018-09-01 DIAGNOSIS — N185 Chronic kidney disease, stage 5: Secondary | ICD-10-CM

## 2018-09-01 DIAGNOSIS — D631 Anemia in chronic kidney disease: Secondary | ICD-10-CM | POA: Diagnosis present

## 2018-09-01 LAB — POCT HEMOGLOBIN-HEMACUE: Hemoglobin: 10 g/dL — ABNORMAL LOW (ref 12.0–15.0)

## 2018-09-01 MED ORDER — EPOETIN ALFA-EPBX 10000 UNIT/ML IJ SOLN: 5000.0000 [IU] | INTRAMUSCULAR | Status: DC

## 2018-09-01 MED ORDER — EPOETIN ALFA-EPBX 3000 UNIT/ML IJ SOLN
3000.0000 [IU] | INTRAMUSCULAR | Status: DC
  Administered 2018-09-01: 3000 [IU] via SUBCUTANEOUS
  Filled 2018-09-01: qty 1

## 2018-09-01 MED ORDER — EPOETIN ALFA-EPBX 2000 UNIT/ML IJ SOLN
2000.0000 [IU] | INTRAMUSCULAR | Status: DC
  Administered 2018-09-01: 2000 [IU] via SUBCUTANEOUS
  Filled 2018-09-01: qty 1

## 2018-09-08 ENCOUNTER — Ambulatory Visit (HOSPITAL_COMMUNITY)
Admission: RE | Admit: 2018-09-08 | Discharge: 2018-09-08 | Disposition: A | Discharge status: Home / Self Care | Payer: Medicare HMO | Source: Ambulatory Visit | Attending: Nephrology | Admitting: Nephrology

## 2018-09-08 VITALS — BP 162/109 | HR 72 | Temp 97.7°F | Resp 20

## 2018-09-08 DIAGNOSIS — D631 Anemia in chronic kidney disease: Secondary | ICD-10-CM | POA: Insufficient documentation

## 2018-09-08 DIAGNOSIS — N185 Chronic kidney disease, stage 5: Secondary | ICD-10-CM | POA: Insufficient documentation

## 2018-09-08 DIAGNOSIS — Z5181 Encounter for therapeutic drug level monitoring: Secondary | ICD-10-CM | POA: Diagnosis not present

## 2018-09-08 DIAGNOSIS — Z79899 Other long term (current) drug therapy: Secondary | ICD-10-CM | POA: Insufficient documentation

## 2018-09-08 LAB — IRON AND TIBC
Iron: 42 ug/dL (ref 28–170)
SATURATION RATIOS: 13 % (ref 10.4–31.8)
TIBC: 318 ug/dL (ref 250–450)
UIBC: 276 ug/dL

## 2018-09-08 LAB — POCT HEMOGLOBIN-HEMACUE: HEMOGLOBIN: 10.4 g/dL — AB (ref 12.0–15.0)

## 2018-09-08 LAB — FERRITIN: FERRITIN: 39 ng/mL (ref 11–307)

## 2018-09-08 MED ORDER — EPOETIN ALFA-EPBX 10000 UNIT/ML IJ SOLN: 5000.0000 [IU] | INTRAMUSCULAR | Status: DC

## 2018-09-08 MED ORDER — EPOETIN ALFA-EPBX 3000 UNIT/ML IJ SOLN
3000.0000 [IU] | Freq: Once | INTRAMUSCULAR | Status: AC
  Administered 2018-09-08: 3000 [IU] via SUBCUTANEOUS
  Filled 2018-09-08: qty 1

## 2018-09-08 MED ORDER — EPOETIN ALFA-EPBX 2000 UNIT/ML IJ SOLN
2000.0000 [IU] | Freq: Once | INTRAMUSCULAR | Status: AC
  Administered 2018-09-08: 2000 [IU] via SUBCUTANEOUS
  Filled 2018-09-08: qty 1

## 2018-09-14 ENCOUNTER — Encounter (HOSPITAL_COMMUNITY): Payer: Medicare HMO

## 2018-09-15 ENCOUNTER — Encounter (HOSPITAL_COMMUNITY): Payer: Medicare HMO

## 2018-09-21 ENCOUNTER — Ambulatory Visit (HOSPITAL_COMMUNITY)
Admission: RE | Admit: 2018-09-21 | Discharge: 2018-09-21 | Disposition: A | Discharge status: Home / Self Care | Payer: Medicare HMO | Source: Ambulatory Visit | Attending: Nephrology | Admitting: Nephrology

## 2018-09-21 VITALS — BP 165/96 | HR 89

## 2018-09-21 DIAGNOSIS — N185 Chronic kidney disease, stage 5: Secondary | ICD-10-CM | POA: Diagnosis not present

## 2018-09-21 LAB — POCT HEMOGLOBIN-HEMACUE: Hemoglobin: 10.2 g/dL — ABNORMAL LOW (ref 12.0–15.0)

## 2018-09-21 MED ORDER — EPOETIN ALFA-EPBX 10000 UNIT/ML IJ SOLN: 5000.0000 [IU] | INTRAMUSCULAR | Status: DC

## 2018-09-21 MED ORDER — EPOETIN ALFA-EPBX 2000 UNIT/ML IJ SOLN
2000.0000 [IU] | Freq: Once | INTRAMUSCULAR | Status: AC
  Administered 2018-09-21: 2000 [IU] via SUBCUTANEOUS
  Filled 2018-09-21: qty 1

## 2018-09-21 MED ORDER — EPOETIN ALFA-EPBX 3000 UNIT/ML IJ SOLN
3000.0000 [IU] | Freq: Once | INTRAMUSCULAR | Status: AC
  Administered 2018-09-21: 3000 [IU] via SUBCUTANEOUS
  Filled 2018-09-21: qty 1

## 2018-09-28 ENCOUNTER — Ambulatory Visit (HOSPITAL_COMMUNITY)
Admission: RE | Admit: 2018-09-28 | Discharge: 2018-09-28 | Disposition: A | Discharge status: Home / Self Care | Payer: Medicare HMO | Source: Ambulatory Visit | Attending: Nephrology | Admitting: Nephrology

## 2018-09-28 VITALS — BP 132/59 | HR 79 | Temp 97.5°F | Resp 20

## 2018-09-28 DIAGNOSIS — N185 Chronic kidney disease, stage 5: Secondary | ICD-10-CM | POA: Insufficient documentation

## 2018-09-28 LAB — POCT HEMOGLOBIN-HEMACUE: HEMOGLOBIN: 9.7 g/dL — AB (ref 12.0–15.0)

## 2018-09-28 MED ORDER — EPOETIN ALFA-EPBX 3000 UNIT/ML IJ SOLN
3000.0000 [IU] | INTRAMUSCULAR | Status: DC
  Administered 2018-09-28: 3000 [IU] via SUBCUTANEOUS
  Filled 2018-09-28: qty 1

## 2018-09-28 MED ORDER — EPOETIN ALFA-EPBX 10000 UNIT/ML IJ SOLN: 5000.0000 [IU] | INTRAMUSCULAR | Status: DC

## 2018-09-28 MED ORDER — EPOETIN ALFA-EPBX 2000 UNIT/ML IJ SOLN
2000.0000 [IU] | INTRAMUSCULAR | Status: DC
  Administered 2018-09-28: 2000 [IU] via SUBCUTANEOUS
  Filled 2018-09-28: qty 1

## 2018-09-30 ENCOUNTER — Encounter (HOSPITAL_COMMUNITY): Payer: Self-pay

## 2018-09-30 ENCOUNTER — Emergency Department (HOSPITAL_COMMUNITY): Payer: Medicare HMO

## 2018-09-30 ENCOUNTER — Observation Stay (HOSPITAL_COMMUNITY)
Admission: EM | Admit: 2018-09-30 | Discharge: 2018-10-01 | Disposition: A | Discharge status: Home / Self Care | Payer: Medicare HMO | Attending: Internal Medicine | Admitting: Internal Medicine

## 2018-09-30 ENCOUNTER — Other Ambulatory Visit: Payer: Self-pay

## 2018-09-30 DIAGNOSIS — F418 Other specified anxiety disorders: Secondary | ICD-10-CM | POA: Diagnosis not present

## 2018-09-30 DIAGNOSIS — E1122 Type 2 diabetes mellitus with diabetic chronic kidney disease: Secondary | ICD-10-CM | POA: Insufficient documentation

## 2018-09-30 DIAGNOSIS — D649 Anemia, unspecified: Secondary | ICD-10-CM | POA: Insufficient documentation

## 2018-09-30 DIAGNOSIS — R0603 Acute respiratory distress: Secondary | ICD-10-CM | POA: Diagnosis present

## 2018-09-30 DIAGNOSIS — I161 Hypertensive emergency: Secondary | ICD-10-CM

## 2018-09-30 DIAGNOSIS — N185 Chronic kidney disease, stage 5: Secondary | ICD-10-CM | POA: Insufficient documentation

## 2018-09-30 DIAGNOSIS — N189 Chronic kidney disease, unspecified: Secondary | ICD-10-CM

## 2018-09-30 DIAGNOSIS — E785 Hyperlipidemia, unspecified: Secondary | ICD-10-CM | POA: Diagnosis not present

## 2018-09-30 DIAGNOSIS — J9621 Acute and chronic respiratory failure with hypoxia: Principal | ICD-10-CM

## 2018-09-30 DIAGNOSIS — D638 Anemia in other chronic diseases classified elsewhere: Secondary | ICD-10-CM | POA: Diagnosis present

## 2018-09-30 DIAGNOSIS — I2510 Atherosclerotic heart disease of native coronary artery without angina pectoris: Secondary | ICD-10-CM | POA: Diagnosis not present

## 2018-09-30 DIAGNOSIS — D631 Anemia in chronic kidney disease: Secondary | ICD-10-CM

## 2018-09-30 DIAGNOSIS — I509 Heart failure, unspecified: Secondary | ICD-10-CM

## 2018-09-30 DIAGNOSIS — E8770 Fluid overload, unspecified: Secondary | ICD-10-CM | POA: Diagnosis not present

## 2018-09-30 DIAGNOSIS — I5043 Acute on chronic combined systolic (congestive) and diastolic (congestive) heart failure: Secondary | ICD-10-CM | POA: Insufficient documentation

## 2018-09-30 DIAGNOSIS — E1129 Type 2 diabetes mellitus with other diabetic kidney complication: Secondary | ICD-10-CM | POA: Diagnosis present

## 2018-09-30 DIAGNOSIS — I132 Hypertensive heart and chronic kidney disease with heart failure and with stage 5 chronic kidney disease, or end stage renal disease: Secondary | ICD-10-CM | POA: Insufficient documentation

## 2018-09-30 DIAGNOSIS — J449 Chronic obstructive pulmonary disease, unspecified: Secondary | ICD-10-CM | POA: Insufficient documentation

## 2018-09-30 HISTORY — DX: Heart failure, unspecified: I50.9

## 2018-09-30 HISTORY — DX: Chronic obstructive pulmonary disease, unspecified: J44.9

## 2018-09-30 HISTORY — DX: Disorder of kidney and ureter, unspecified: N28.9

## 2018-09-30 LAB — GLUCOSE, CAPILLARY
Glucose-Capillary: 85 mg/dL (ref 70–99)
Glucose-Capillary: 132 mg/dL — ABNORMAL HIGH (ref 70–99)
Glucose-Capillary: 98 mg/dL (ref 70–99)

## 2018-09-30 LAB — I-STAT ARTERIAL BLOOD GAS, ED
Acid-base deficit: 11 mmol/L — ABNORMAL HIGH (ref 0.0–2.0)
Bicarbonate: 16.2 mmol/L — ABNORMAL LOW (ref 20.0–28.0)
O2 Saturation: 99 %
PCO2 ART: 38.8 mmHg (ref 32.0–48.0)
PH ART: 7.228 — AB (ref 7.350–7.450)
TCO2: 17 mmol/L — ABNORMAL LOW (ref 22–32)
pO2, Arterial: 153 mmHg — ABNORMAL HIGH (ref 83.0–108.0)

## 2018-09-30 LAB — I-STAT TROPONIN, ED: Troponin i, poc: 0.04 ng/mL (ref 0.00–0.08)

## 2018-09-30 LAB — COMPREHENSIVE METABOLIC PANEL
ALK PHOS: 124 U/L (ref 38–126)
ALT: 16 U/L (ref 0–44)
ANION GAP: 15 (ref 5–15)
AST: 28 U/L (ref 15–41)
Albumin: 3.5 g/dL (ref 3.5–5.0)
BUN: 49 mg/dL — ABNORMAL HIGH (ref 8–23)
CALCIUM: 9.3 mg/dL (ref 8.9–10.3)
CO2: 12 mmol/L — ABNORMAL LOW (ref 22–32)
CREATININE: 5.56 mg/dL — AB (ref 0.44–1.00)
Chloride: 114 mmol/L — ABNORMAL HIGH (ref 98–111)
GFR, EST AFRICAN AMERICAN: 8 mL/min — AB (ref >60)
GFR, EST NON AFRICAN AMERICAN: 7 mL/min — AB (ref >60)
Glucose, Bld: 354 mg/dL — ABNORMAL HIGH (ref 70–99)
Potassium: 3.7 mmol/L (ref 3.5–5.1)
SODIUM: 141 mmol/L (ref 135–145)
TOTAL PROTEIN: 6.7 g/dL (ref 6.5–8.1)
Total Bilirubin: 0.5 mg/dL (ref 0.3–1.2)

## 2018-09-30 LAB — CBC
HCT: 35.8 % — ABNORMAL LOW (ref 36.0–46.0)
HEMOGLOBIN: 9.8 g/dL — AB (ref 12.0–15.0)
MCH: 27.9 pg (ref 26.0–34.0)
MCHC: 27.4 g/dL — AB (ref 30.0–36.0)
MCV: 102 fL — AB (ref 80.0–100.0)
Platelets: 327 10*3/uL (ref 150–400)
RBC: 3.51 MIL/uL — ABNORMAL LOW (ref 3.87–5.11)
RDW: 15.9 % — ABNORMAL HIGH (ref 11.5–15.5)
WBC: 11.3 10*3/uL — ABNORMAL HIGH (ref 4.0–10.5)
nRBC: 0 % (ref 0.0–0.2)

## 2018-09-30 LAB — MRSA PCR SCREENING: MRSA by PCR: NEGATIVE

## 2018-09-30 LAB — CBG MONITORING, ED: Glucose-Capillary: 115 mg/dL — ABNORMAL HIGH (ref 70–99)

## 2018-09-30 LAB — BRAIN NATRIURETIC PEPTIDE: B Natriuretic Peptide: >4500 pg/mL — ABNORMAL HIGH (ref 0.0–100.0)

## 2018-09-30 LAB — MAGNESIUM: MAGNESIUM: 2.1 mg/dL (ref 1.7–2.4)

## 2018-09-30 MED ORDER — FLUTICASONE FUROATE-VILANTEROL 100-25 MCG/INH IN AEPB
1.0000 | INHALATION_SPRAY | Freq: Every day | RESPIRATORY_TRACT | Status: DC
  Administered 2018-09-30: 1 via RESPIRATORY_TRACT
  Filled 2018-09-30: qty 28

## 2018-09-30 MED ORDER — HEPARIN SODIUM (PORCINE) 5000 UNIT/ML IJ SOLN
5000.0000 [IU] | Freq: Three times a day (TID) | INTRAMUSCULAR | Status: DC
  Administered 2018-09-30 – 2018-10-01 (×4): 5000 [IU] via SUBCUTANEOUS
  Filled 2018-09-30 (×3): qty 1

## 2018-09-30 MED ORDER — NEPRO/CARBSTEADY PO LIQD
237.0000 mL | Freq: Two times a day (BID) | ORAL | Status: DC
  Administered 2018-10-01: 237 mL via ORAL

## 2018-09-30 MED ORDER — NITROGLYCERIN IN D5W 200-5 MCG/ML-% IV SOLN
0.0000 ug/min | Freq: Once | INTRAVENOUS | Status: AC
  Administered 2018-09-30: 50 ug/min via INTRAVENOUS

## 2018-09-30 MED ORDER — IPRATROPIUM BROMIDE 0.02 % IN SOLN
0.5000 mg | Freq: Once | RESPIRATORY_TRACT | Status: AC
  Administered 2018-09-30: 0.5 mg via RESPIRATORY_TRACT

## 2018-09-30 MED ORDER — ONDANSETRON HCL 4 MG/2ML IJ SOLN: 4.0000 mg | Freq: Four times a day (QID) | INTRAMUSCULAR | Status: DC | PRN

## 2018-09-30 MED ORDER — INSULIN ASPART 100 UNIT/ML ~~LOC~~ SOLN: 0.0000 [IU] | Freq: Every day | SUBCUTANEOUS | Status: DC

## 2018-09-30 MED ORDER — ASPIRIN EC 81 MG PO TBEC: 81.0000 mg | DELAYED_RELEASE_TABLET | Freq: Every day | ORAL | Status: DC

## 2018-09-30 MED ORDER — HYDRALAZINE HCL 20 MG/ML IJ SOLN: 5.0000 mg | INTRAMUSCULAR | Status: DC | PRN

## 2018-09-30 MED ORDER — AMLODIPINE BESYLATE 10 MG PO TABS
10.0000 mg | ORAL_TABLET | Freq: Every day | ORAL | Status: DC
  Administered 2018-09-30 – 2018-10-01 (×2): 10 mg via ORAL
  Filled 2018-09-30: qty 1
  Filled 2018-09-30: qty 2

## 2018-09-30 MED ORDER — FLUTICASONE-UMECLIDIN-VILANT 100-62.5-25 MCG/INH IN AEPB: 1.0000 | INHALATION_SPRAY | Freq: Every day | RESPIRATORY_TRACT | Status: DC

## 2018-09-30 MED ORDER — NITROGLYCERIN 0.4 MG SL SUBL
0.4000 mg | SUBLINGUAL_TABLET | Freq: Once | SUBLINGUAL | Status: AC
  Administered 2018-09-30: 0.4 mg via SUBLINGUAL

## 2018-09-30 MED ORDER — ALBUTEROL (5 MG/ML) CONTINUOUS INHALATION SOLN
INHALATION_SOLUTION | RESPIRATORY_TRACT | Status: AC
  Filled 2018-09-30: qty 20

## 2018-09-30 MED ORDER — FUROSEMIDE 10 MG/ML IJ SOLN
120.0000 mg | Freq: Two times a day (BID) | INTRAVENOUS | Status: DC
  Administered 2018-09-30 – 2018-10-01 (×3): 120 mg via INTRAVENOUS
  Filled 2018-09-30 (×3): qty 10
  Filled 2018-09-30: qty 12

## 2018-09-30 MED ORDER — DM-GUAIFENESIN ER 30-600 MG PO TB12: 1.0000 | ORAL_TABLET | Freq: Two times a day (BID) | ORAL | Status: DC | PRN

## 2018-09-30 MED ORDER — NITROGLYCERIN 0.4 MG SL SUBL
SUBLINGUAL_TABLET | SUBLINGUAL | Status: AC
  Administered 2018-09-30: 0.4 mg via SUBLINGUAL
  Filled 2018-09-30: qty 1

## 2018-09-30 MED ORDER — CALCITRIOL 0.25 MCG PO CAPS
0.2500 ug | ORAL_CAPSULE | Freq: Every day | ORAL | Status: DC
  Administered 2018-09-30 – 2018-10-01 (×2): 0.25 ug via ORAL
  Filled 2018-09-30 (×2): qty 1

## 2018-09-30 MED ORDER — METOPROLOL SUCCINATE ER 50 MG PO TB24
50.0000 mg | ORAL_TABLET | Freq: Every day | ORAL | Status: DC
  Administered 2018-09-30 – 2018-10-01 (×2): 50 mg via ORAL
  Filled 2018-09-30 (×2): qty 1

## 2018-09-30 MED ORDER — ACETAMINOPHEN 325 MG PO TABS: 650.0000 mg | ORAL_TABLET | ORAL | Status: DC | PRN

## 2018-09-30 MED ORDER — CLOPIDOGREL BISULFATE 75 MG PO TABS
75.0000 mg | ORAL_TABLET | Freq: Every day | ORAL | Status: DC
  Administered 2018-09-30 – 2018-10-01 (×2): 75 mg via ORAL
  Filled 2018-09-30 (×2): qty 1

## 2018-09-30 MED ORDER — ALPRAZOLAM 0.25 MG PO TABS
0.2500 mg | ORAL_TABLET | Freq: Every day | ORAL | Status: DC
  Administered 2018-09-30: 0.25 mg via ORAL
  Filled 2018-09-30: qty 1

## 2018-09-30 MED ORDER — ALPRAZOLAM 0.25 MG PO TABS: 0.2500 mg | ORAL_TABLET | Freq: Two times a day (BID) | ORAL | Status: DC | PRN

## 2018-09-30 MED ORDER — HYDRALAZINE HCL 50 MG PO TABS
100.0000 mg | ORAL_TABLET | Freq: Three times a day (TID) | ORAL | Status: DC
  Administered 2018-09-30 – 2018-10-01 (×3): 100 mg via ORAL
  Filled 2018-09-30 (×3): qty 2

## 2018-09-30 MED ORDER — ISOSORBIDE MONONITRATE ER 30 MG PO TB24
30.0000 mg | ORAL_TABLET | Freq: Every day | ORAL | Status: DC
  Administered 2018-09-30 – 2018-10-01 (×2): 30 mg via ORAL
  Filled 2018-09-30 (×2): qty 1

## 2018-09-30 MED ORDER — ASPIRIN EC 81 MG PO TBEC
81.0000 mg | DELAYED_RELEASE_TABLET | Freq: Every day | ORAL | Status: DC
  Administered 2018-09-30 – 2018-10-01 (×2): 81 mg via ORAL
  Filled 2018-09-30 (×2): qty 1

## 2018-09-30 MED ORDER — SODIUM CHLORIDE 0.9% FLUSH: 3.0000 mL | INTRAVENOUS | Status: DC | PRN

## 2018-09-30 MED ORDER — SODIUM CHLORIDE 0.9% FLUSH
3.0000 mL | Freq: Two times a day (BID) | INTRAVENOUS | Status: DC
  Administered 2018-09-30 – 2018-10-01 (×3): 3 mL via INTRAVENOUS

## 2018-09-30 MED ORDER — ALBUTEROL SULFATE (2.5 MG/3ML) 0.083% IN NEBU: 2.5000 mg | INHALATION_SOLUTION | RESPIRATORY_TRACT | Status: DC | PRN

## 2018-09-30 MED ORDER — UMECLIDINIUM BROMIDE 62.5 MCG/INH IN AEPB
1.0000 | INHALATION_SPRAY | Freq: Every day | RESPIRATORY_TRACT | Status: DC
  Administered 2018-09-30 – 2018-10-01 (×2): 1 via RESPIRATORY_TRACT
  Filled 2018-09-30: qty 7

## 2018-09-30 MED ORDER — IPRATROPIUM BROMIDE 0.02 % IN SOLN
0.5000 mg | Freq: Four times a day (QID) | RESPIRATORY_TRACT | Status: DC
  Administered 2018-09-30: 0.5 mg via RESPIRATORY_TRACT
  Filled 2018-09-30 (×2): qty 2.5

## 2018-09-30 MED ORDER — ALBUTEROL (5 MG/ML) CONTINUOUS INHALATION SOLN
5.0000 mg/h | INHALATION_SOLUTION | Freq: Once | RESPIRATORY_TRACT | Status: AC
  Administered 2018-09-30: 5 mg/h via RESPIRATORY_TRACT

## 2018-09-30 MED ORDER — SODIUM CHLORIDE 0.9 % IV SOLN: 250.0000 mL | INTRAVENOUS | Status: DC | PRN

## 2018-09-30 MED ORDER — ZOLPIDEM TARTRATE 5 MG PO TABS: 5.0000 mg | ORAL_TABLET | Freq: Every evening | ORAL | Status: DC | PRN

## 2018-09-30 MED ORDER — IPRATROPIUM-ALBUTEROL 0.5-2.5 (3) MG/3ML IN SOLN
3.0000 mL | Freq: Two times a day (BID) | RESPIRATORY_TRACT | Status: DC
  Administered 2018-09-30 – 2018-10-01 (×2): 3 mL via RESPIRATORY_TRACT
  Filled 2018-09-30 (×2): qty 3

## 2018-09-30 MED ORDER — HYDRALAZINE HCL 50 MG PO TABS
50.0000 mg | ORAL_TABLET | Freq: Three times a day (TID) | ORAL | Status: DC
  Administered 2018-09-30: 50 mg via ORAL
  Filled 2018-09-30: qty 2

## 2018-09-30 MED ORDER — INSULIN ASPART 100 UNIT/ML ~~LOC~~ SOLN
0.0000 [IU] | Freq: Three times a day (TID) | SUBCUTANEOUS | Status: DC
  Administered 2018-09-30: 1 [IU] via SUBCUTANEOUS

## 2018-09-30 MED ORDER — NITROGLYCERIN IN D5W 200-5 MCG/ML-% IV SOLN
INTRAVENOUS | Status: AC
  Filled 2018-09-30: qty 250

## 2018-09-30 MED ORDER — ALBUTEROL (5 MG/ML) CONTINUOUS INHALATION SOLN: 5.0000 mg/h | INHALATION_SOLUTION | Freq: Once | RESPIRATORY_TRACT | Status: DC

## 2018-09-30 MED ORDER — BUPROPION HCL ER (SR) 150 MG PO TB12
150.0000 mg | ORAL_TABLET | Freq: Two times a day (BID) | ORAL | Status: DC
  Administered 2018-09-30 – 2018-10-01 (×3): 150 mg via ORAL
  Filled 2018-09-30 (×3): qty 1

## 2018-09-30 NOTE — ED Triage Notes (Signed)
Pt comes via [facility], severe SOB for the past six hours, hx of COPD, CHF, has dialysis fistula but has not started yet. PTA received one SL nitro, 5 albuterol, 0.5 Atrovent.

## 2018-09-30 NOTE — ED Notes (Signed)
Pt placed on purewick 

## 2018-09-30 NOTE — Progress Notes (Signed)
PROGRESS NOTE    Kari Robinson  WUJ:[id number] DOB: Oct 02, 1951 DOA: 09/30/2018 PCP: Patient, [name]   Brief Narrative:  67 year old woman with stage V CKD failed peritoneal dialysis pending maturation of graft, anemia of chronic kidney disease, poorly controlled hypertension, hyperlipidemia, diet-controlled diabetes, COPD, depression/anxiety, coronary artery disease status post stent, lung cancer status post surgery, chronic systolic heart failure (EF of 40 to 45% by echo on 06/04/2018) who presents with shortness of breath in the setting of hypertensive emergency.   Assessment & Plan:   Principal Problem:   Acute on chronic respiratory failure with hypoxia ([name]) Active Problems:   COPD (chronic obstructive pulmonary disease) ([name])   Acute on chronic combined systolic and diastolic CHF (congestive heart failure) ([name])   Hypertensive emergency   Type II diabetes mellitus with renal manifestations ([name])   HLD (hyperlipidemia)   Depression with anxiety   CKD (chronic kidney disease), stage V ([name])   Fluid overload   CAD (coronary artery disease)   Anemia in CKD (chronic kidney disease)   #) Hypertensive emergency/shortness of breath: Suspect likely secondary to poorly controlled hypertension.  She reports that she continues to make urine. -Cross titrate with nitroglycerin drip -Continue furosemide 120 mg every 12 hours - Hold home oral torsemide 40 mg daily -Continue amlodipine 10 mg daily -Continue hydralazine 50 mg 3 times daily, will increase 200 mg 3 times daily -Continue isosorbide mononitrate 30 mg daily -Continue with Toprol succinate 50 mg daily, will consider transitioning to carvedilol  #) Coronary artery disease status post stent/hypertension/hyperlipidemia: - Hydralazine, long-acting nitrate, beta-blocker, amlodipine per above - Continue aspirin 81 mg -Continue clopidogrel 75 mg -Unclear why patient is not on statin  #) COPD: No evidence of wheezing -Continue  ICS/L AMA/LABA X-continue PRN bronchodilators  #) Stage V CKD: -Diuresis per above -Continue calcitriol 0.25 mg daily  #) Pain/psych: -Continue bupropion 150 mg twice daily  Fluids: Restrict Electrodes: Monitor and supplement Nutrition: Renal diet   Prophylaxis: Subcu heparin  Disposition: Pending weaning of nitroglycerin drip  Full code    Consultants:   None  Procedures:   None  Antimicrobials:   None   Subjective: Patient reports that her shortness of breath and breathing is much better.  She denies any cough, congestion, chest pain, nausea, vomiting, diarrhea.  Objective: Vitals:   09/30/18 1015 09/30/18 1045 09/30/18 1100 09/30/18 1242  BP: (!) 146/107 (!) 135/108 (!) 153/92 122/82  Pulse: 100 (!) 43 (!) 55 (!) 52  Resp: 20 19 (!) 25   Temp:    98.4 F (36.9 C)  TempSrc:    Oral  SpO2: 97% 99% 99% 100%  Weight:      Height:        Intake/Output Summary (Last 24 hours) at 09/30/2018 1246 Last data filed at 09/30/2018 1225 Gross per 24 hour  Intake 65.1 ml  Output 400 ml  Net -334.9 ml   Filed Weights   09/30/18 0308  Weight: 59 kg    Examination:  General exam: Appears calm and comfortable  Respiratory system: Clear to auscultation. Respiratory effort normal.  No wheezes, crackles, rhonchi Cardiovascular system: Regular rate and rhythm, no murmurs Gastrointestinal system: Soft, nondistended, no rebound or guarding, plus bowel sounds Central nervous system: Alert and oriented.  Grossly intact, moving all extremities Extremities: Immature fistula site with thrill and bruit Skin: No rashes over visible skin Psychiatry: Judgement and insight appear normal. Mood & affect appropriate.     Data Reviewed: I have personally  reviewed following labs and imaging studies  CBC: Recent Labs  Lab 09/30/18 0256  WBC 11.3*  HGB 9.8*  HCT 35.8*  MCV 102.0*  PLT 643   Basic Metabolic Panel: Recent Labs  Lab 09/30/18 0256  NA 141  K 3.7  CL  114*  CO2 12*  GLUCOSE 354*  BUN 49*  CREATININE 5.56*  CALCIUM 9.3  MG 2.1   GFR: Estimated Creatinine Clearance: 8.7 mL/min (A) (by C-G formula based on SCr of 5.56 mg/dL (H)). Liver Function Tests: Recent Labs  Lab 09/30/18 0256  AST 28  ALT 16  ALKPHOS 124  BILITOT 0.5  PROT 6.7  ALBUMIN 3.5   No results for input(s): LIPASE, AMYLASE in the last 168 hours. No results for input(s): AMMONIA in the last 168 hours. Coagulation Profile: No results for input(s): INR, PROTIME in the last 168 hours. Cardiac Enzymes: No results for input(s): CKTOTAL, CKMB, CKMBINDEX, TROPONINI in the last 168 hours. BNP (last 3 results) No results for input(s): PROBNP in the last 8760 hours. HbA1C: No results for input(s): HGBA1C in the last 72 hours. CBG: Recent Labs  Lab 09/30/18 0754  GLUCAP 115*   Lipid Profile: No results for input(s): CHOL, HDL, LDLCALC, TRIG, CHOLHDL, LDLDIRECT in the last 72 hours. Thyroid Function Tests: No results for input(s): TSH, T4TOTAL, FREET4, T3FREE, THYROIDAB in the last 72 hours. Anemia Panel: No results for input(s): VITAMINB12, FOLATE, FERRITIN, TIBC, IRON, RETICCTPCT in the last 72 hours. Sepsis Labs: No results for input(s): PROCALCITON, LATICACIDVEN in the last 168 hours.  No results found for this or any previous visit (from the past 240 hour(s)).       Radiology Studies: Dg Chest Portable 1 View  Result Date: 09/30/2018 CLINICAL DATA:  Acute onset of shortness of breath. EXAM: PORTABLE CHEST 1 VIEW COMPARISON:  None. FINDINGS: The lungs are well-aerated. Bibasilar airspace opacities may reflect pneumonia or mild pulmonary edema. Vascular congestion is noted. A small left pleural effusion is noted. No pneumothorax is seen. The cardiomediastinal silhouette is borderline enlarged. No acute osseous abnormalities are seen. A calcified granuloma is noted at the right lung apex. IMPRESSION: Bibasilar airspace opacities may reflect pneumonia or  mild pulmonary edema. Vascular congestion and borderline cardiomegaly. Small left pleural effusion. Electronically Signed   By: Garald Balding M.D.   On: 09/30/2018 03:23        Scheduled Meds: . ALPRAZolam  0.25 mg Oral QHS  . amLODipine  10 mg Oral Daily  . aspirin EC  81 mg Oral Daily  . buPROPion  150 mg Oral BID  . calcitRIOL  0.25 mcg Oral Daily  . clopidogrel  75 mg Oral Daily  . fluticasone furoate-vilanterol  1 puff Inhalation Daily   And  . umeclidinium bromide  1 puff Inhalation Daily  . heparin  5,000 Units Subcutaneous Q8H  . hydrALAZINE  50 mg Oral TID  . insulin aspart  0-5 Units Subcutaneous QHS  . insulin aspart  0-9 Units Subcutaneous TID WC  . ipratropium  0.5 mg Nebulization Q6H  . isosorbide mononitrate  30 mg Oral Daily  . metoprolol succinate  50 mg Oral Daily  . sodium chloride flush  3 mL Intravenous Q12H   Continuous Infusions: . sodium chloride    . furosemide Stopped (09/30/18 0657)     LOS: 0 days    Time spent: [place], MD [facility]  If 7PM-7AM, please contact night-coverage [email or web address] Password [facility] 09/30/2018, 12:46 PM

## 2018-09-30 NOTE — ED Notes (Signed)
Unmeasured output x2 due to purewick misplacement.

## 2018-09-30 NOTE — ED Provider Notes (Signed)
[place] EMERGENCY DEPARTMENT Provider Note  CSN: [id number] Arrival date & time: 09/30/18 0243  Chief Complaint(s) Respiratory Distress  HPI Kari Robinson is a 67 y.o. female with a history of CHF with last EF of 40 to 45%, COPD, CKD who used to be on peritoneal dialysis and now has a AV fistula in the right forearm not currently on dialysis who presents to the emergency department with respiratory distress.  Patient called EMS due to the shortness of breath.  She was found to be somnolent and hypoxic.  Poor air movement noted on their auscultation.  Patient was placed on CPAP and given 1 DuoNeb.  Patient also given 1 sublingual nitroglycerin due to noted hypertension.  This has improved her work of breathing in route however she remained hypoxic with sats in the 80s.  History difficult to obtain due to respiratory distress  Remainder of history, ROS, and physical exam limited due to patient's condition (respiratory distress). Additional information was obtained from EMS.   Level V Caveat.    HPI  Past Medical History Past Medical History:  Diagnosis Date  . CHF (congestive heart failure) ([place])   . COPD (chronic obstructive pulmonary disease) ([place])   . Renal disorder    There are no active problems to display for this patient.  Home Medication(s) Prior to Admission medications   Not on File                                                                                                                                    Past Surgical History ** The histories are not reviewed yet. Please review them in the "History" navigator section and refresh this [place]. Family History No family history on file.  Social History Social History   Tobacco Use  . Smoking status: Not on file  Substance Use Topics  . Alcohol use: Not on file  . Drug use: Not on file   Allergies Citalopram and Morphine and related  Review of Systems Review of Systems  Unable  to perform ROS: Acuity of condition    Physical Exam Vital Signs  I have reviewed the triage vital signs BP 196/121   Pulse 87   Temp (!) 97.5 F (36.4 C) (Axillary)   Resp (!) 35   Ht 5\' 5"  (1.651 m)   Wt 59 kg   SpO2 100%   BMI 21.63 kg/m   Physical Exam  Constitutional: She is oriented to person, place, and time. She appears well-developed and well-nourished. She has a sickly appearance. No distress.  HENT:  Head: Normocephalic and atraumatic.  Nose: Nose normal.  Eyes: Pupils are equal, round, and reactive to light. Conjunctivae and EOM are normal. Right eye exhibits no discharge. Left eye exhibits no discharge. No scleral icterus.  Neck: Normal range of motion. Neck supple.  Cardiovascular: Normal rate and regular rhythm. Exam reveals no gallop and no friction rub.  No murmur heard. Right arm fistula with good thrill  Pulmonary/Chest: No stridor. Tachypnea noted. She is in respiratory distress. She has rales in the right upper field, the right middle field, the right lower field, the left upper field, the left middle field and the left lower field.  On CPAP  Abdominal: Soft. She exhibits no distension. There is no tenderness.  Musculoskeletal: She exhibits no edema or tenderness.  Neurological: She is alert and oriented to person, place, and time.  Skin: Skin is warm and dry. No rash noted. She is not diaphoretic. No erythema.  Psychiatric: She has a normal mood and affect.  Vitals reviewed.   ED Results and Treatments Labs (all labs ordered are listed, but only abnormal results are displayed) Labs Reviewed  BRAIN NATRIURETIC PEPTIDE - Abnormal; Notable for the following components:      Result Value   B Natriuretic Peptide >4,500.0 (*)    All other components within normal limits  CBC - Abnormal; Notable for the following components:   WBC 11.3 (*)    RBC 3.51 (*)    Hemoglobin 9.8 (*)    HCT 35.8 (*)    MCV 102.0 (*)    MCHC 27.4 (*)    RDW 15.9 (*)    All  other components within normal limits  COMPREHENSIVE METABOLIC PANEL - Abnormal; Notable for the following components:   Chloride 114 (*)    CO2 12 (*)    Glucose, Bld 354 (*)    BUN 49 (*)    Creatinine, Ser 5.56 (*)    GFR calc non Af Amer 7 (*)    GFR calc Af Amer 8 (*)    All other components within normal limits  I-STAT ARTERIAL BLOOD GAS, ED - Abnormal; Notable for the following components:   pH, Arterial 7.228 (*)    pO2, Arterial 153.0 (*)    Bicarbonate 16.2 (*)    TCO2 17 (*)    Acid-base deficit 11.0 (*)    All other components within normal limits  MAGNESIUM  I-STAT TROPONIN, ED  I-STAT TROPONIN, ED                                                                                                                         EKG  EKG Interpretation  Date/Time:  Friday September 30 2018 02:46:44 EST Ventricular Rate:  129 PR Interval:    QRS Duration: 100 QT Interval:  339 QTC Calculation: 497 R Axis:   81 Text Interpretation:  Sinus tachycardia Paired ventricular premature complexes Probable left atrial enlargement Borderline right axis deviation RSR' in V1 or V2, probably normal variant Repolarization abnormality, prob rate related Baseline wander in lead(s) II aVF NO STEMI Confirmed by Addison Lank [phone]) on 09/30/2018 3:44:57 AM      Radiology Dg Chest Portable 1 View  Result Date: 09/30/2018 CLINICAL DATA:  Acute onset of shortness of breath. EXAM: PORTABLE CHEST 1 VIEW COMPARISON:  None. FINDINGS: The lungs are well-aerated.  Bibasilar airspace opacities may reflect pneumonia or mild pulmonary edema. Vascular congestion is noted. A small left pleural effusion is noted. No pneumothorax is seen. The cardiomediastinal silhouette is borderline enlarged. No acute osseous abnormalities are seen. A calcified granuloma is noted at the right lung apex. IMPRESSION: Bibasilar airspace opacities may reflect pneumonia or mild pulmonary edema. Vascular congestion and borderline  cardiomegaly. Small left pleural effusion. Electronically Signed   By: Garald Balding M.D.   On: 09/30/2018 03:23   Pertinent labs & imaging results that were available during my care of the patient were reviewed by me and considered in my medical decision making (see chart for details).  Medications Ordered in ED Medications  nitroGLYCERIN 50 mg in dextrose 5 % 250 mL (0.2 mg/mL) infusion (50 mcg/min Intravenous New Bag/Given 09/30/18 0255)  nitroGLYCERIN (NITROSTAT) SL tablet 0.4 mg (0.4 mg Sublingual Given 09/30/18 0254)  ipratropium (ATROVENT) nebulizer solution 0.5 mg (0.5 mg Nebulization Given 09/30/18 0311)  albuterol (PROVENTIL,VENTOLIN) solution continuous neb (5 mg/hr Nebulization Given 09/30/18 0311)                                                                                                                                    Procedures Procedures CRITICAL CARE Performed by: Grayce Sessions Deni Berti Total critical care time: 40 minutes Critical care time was exclusive of separately billable procedures and treating other patients. Critical care was necessary to treat or prevent imminent or life-threatening deterioration. Critical care was time spent personally by me on the following activities: development of treatment plan with patient and/or surrogate as well as nursing, discussions with consultants, evaluation of patient's response to treatment, examination of patient, obtaining history from patient or surrogate, ordering and performing treatments and interventions, ordering and review of laboratory studies, ordering and review of radiographic studies, pulse oximetry and re-evaluation of patient's condition.   (including critical care time)  Medical Decision Making / ED Course I have reviewed the nursing notes for this encounter and the patient's prior records (if available in EHR or on provided paperwork).    Respiratory distress.  Requiring BiPAP.  Lungs with diffuse  crackles and patient hypertensive.  Given history of congestive heart failure, likely CHF exacerbation.  Additionally EMS reported poor air movement during their evaluation.  Superimposed COPD exacerbation also likelihood.  Patient given additional sublingual nitroglycerin and placed on nitroglycerin drip.  Given continuous DuoNeb.  Chest x-ray consistent with CHF exacerbation.  Radiology question possible focal infiltrate.  Following treatment, patient endorsed significant work of breathing.  She denied any recent infectious symptoms that would be concerning for pneumonia.  She reported that she began having sudden onset shortness of breath approximately 12 hours prior to arrival that gradually worsened throughout the day.  Patient admitted to medicine for continued management.  Final Clinical Impression(s) / ED Diagnoses Final diagnoses:  Hypertensive emergency       This chart was dictated using voice recognition  software.  Despite best efforts to proofread,  errors can occur which can change the documentation meaning.   Fatima Blank, MD 09/30/18 [id number]

## 2018-09-30 NOTE — Progress Notes (Signed)
Weaned patient off BiPAP to 2L [place].  Vital signs stable.  Patient resting comfortable.  RT will continue to monitor.

## 2018-09-30 NOTE — H&P (Signed)
History and Physical    Kari Robinson FTD:[id number] DOB: [birth year] DOA: 09/30/2018  Referring MD/NP/PA:   PCP: No primary care provider on file.   Patient coming from:  The patient is coming from home.  At baseline, pt is independent for most of ADL.        Chief Complaint: SOB  HPI: Kari Robinson is a 67 y.o. 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  Patient states that she started having worsening shortness of breath today, which has been progressively getting worse.  She has mild dry cough, but no fever or chills.  She had chest pain earlier, which has completely resolved.  Currently patient does not have any chest pain.  No nausea vomiting, diarrhea or abdominal pain pain no symptoms of UTI or unilateral weakness.  Patient's blood pressure is significantly elevated at 196/121, which improved to 126/87 on nitroglycerin drip.  ED Course: pt was found to have WBC 11.3, negative troponin, BNP 4500, worsening renal function, temperature 97.5, heart rate 30-135, currently 87, tachypnea, oxygen saturation 88% on room air, BiPAP started, ABG with pH 7.228, PCO2 38, PaO2 153. chest x-ray showed pulmonary edema, cardiomegaly and small left pleural effusion.  Patient is placed on stepdown bed for observation.  Review of Systems:   General: no fevers, chills, no body weight gain, has fatigue HEENT: no blurry vision, hearing changes or sore throat Respiratory: has dyspnea, coughing, no wheezing CV: currently no chest pain, no palpitations GI: no nausea, vomiting, abdominal pain, diarrhea, constipation GU: no dysuria, burning on urination, increased urinary frequency, hematuria  Ext: no leg edema Neuro: no unilateral weakness,  numbness, or tingling, no vision change or hearing loss Skin: no rash, no skin tear. MSK: No muscle spasm, no deformity, no limitation of range of movement in spin Heme: No easy bruising.  Travel history: No recent long distant travel.  Allergy:  Allergies  Allergen Reactions  . Citalopram Hives  . Morphine And Related Itching    Past Medical History:  Diagnosis Date  . CHF (congestive heart failure) ([place])   . COPD (chronic obstructive pulmonary disease) ([place])   . Renal disorder     Past Surgical History:  Procedure Laterality Date  . AVF placement Right   . LUNG SURGERY     due to lung cancer per patient's report    Social History:  reports that she has been smoking. She has never used smokeless tobacco. She reports that she does not drink alcohol or use drugs.  Family History: Patient is adopted.  Prior to Admission medications   Not on File    Physical Exam: Vitals:   09/30/18 0345 09/30/18 0400 09/30/18 0411 09/30/18 0430  BP: (!) 155/72 (!) 149/87  126/87  Pulse: 93 88  87  Resp: (!) 22 20  (!) 21  Temp:   (!) 97.5 F (36.4 C)   TempSrc:   Axillary   SpO2: 100% 100%  100%  Weight:      Height:       General: Not in acute distress HEENT:       Eyes: PERRL, EOMI, no scleral icterus.       ENT: No discharge from the ears and nose, no pharynx injection, no tonsillar enlargement.  Neck: No JVD, no bruit, no mass felt. Heme: No neck lymph node enlargement. Cardiac: S1/S2, RRR, No murmurs, No gallops or rubs. Respiratory: Decreased air movement bilaterally, has rhonchi. GI: Soft, nondistended, nontender, no rebound pain, no organomegaly, BS present. GU: No hematuria Ext: No pitting leg edema bilaterally. 2+DP/PT pulse bilaterally. Musculoskeletal: No joint deformities, No joint redness or warmth, no limitation of ROM in spin. Skin: No rashes.  Neuro: Alert, oriented X3, cranial nerves II-XII grossly intact, moves all extremities normally. Psych:  Patient is not psychotic, no suicidal or hemocidal ideation.  Labs on Admission: I have personally reviewed following labs and imaging studies  CBC: Recent Labs  Lab 09/30/18 0256  WBC 11.3*  HGB 9.8*  HCT 35.8*  MCV 102.0*  PLT 240   Basic Metabolic Panel: Recent Labs  Lab 09/30/18 0256  NA 141  K 3.7  CL 114*  CO2 12*  GLUCOSE 354*  BUN 49*  CREATININE 5.56*  CALCIUM 9.3  MG 2.1   GFR: Estimated Creatinine Clearance: 8.7 mL/min (A) (by C-G formula based on SCr of 5.56 mg/dL (H)). Liver Function Tests: Recent Labs  Lab 09/30/18 0256  AST 28  ALT 16  ALKPHOS 124  BILITOT 0.5  PROT 6.7  ALBUMIN 3.5   No results for input(s): LIPASE, AMYLASE in the last 168 hours. No results for input(s): AMMONIA in the last 168 hours. Coagulation Profile: No results for input(s): INR, PROTIME in the last 168 hours. Cardiac Enzymes: No results for input(s): CKTOTAL, CKMB, CKMBINDEX, TROPONINI in the last 168 hours. BNP (last 3 results) No results for input(s): PROBNP in the last 8760 hours. HbA1C: No results for input(s): HGBA1C in the last 72 hours. CBG: No results for input(s): GLUCAP in the last 168 hours. Lipid Profile: No results for input(s): CHOL, HDL, LDLCALC, TRIG, CHOLHDL, LDLDIRECT in the last 72 hours. Thyroid Function Tests: No results for input(s): TSH, T4TOTAL, FREET4, T3FREE, THYROIDAB in the last 72 hours. Anemia Panel: No results for input(s): VITAMINB12, FOLATE, FERRITIN, TIBC, IRON, RETICCTPCT in the last 72 hours. Urine analysis: No results found for: COLORURINE, APPEARANCEUR, LABSPEC, PHURINE, GLUCOSEU, HGBUR, BILIRUBINUR, KETONESUR, PROTEINUR, UROBILINOGEN, NITRITE, LEUKOCYTESUR Sepsis Labs: @LABRCNTIP (procalcitonin:4,lacticidven:4) )No results found for this or any previous visit (from the past 240 hour(s)).   Radiological Exams on Admission: Dg Chest Portable 1 View  Result Date: 09/30/2018 CLINICAL DATA:  Acute onset of shortness of breath.  EXAM: PORTABLE CHEST 1 VIEW COMPARISON:  None. FINDINGS: The lungs are well-aerated. Bibasilar airspace opacities may reflect pneumonia or mild pulmonary edema. Vascular congestion is noted. A small left pleural effusion is noted. No pneumothorax is seen. The cardiomediastinal silhouette is borderline enlarged. No acute osseous abnormalities are seen. A calcified granuloma is noted at the right lung apex. IMPRESSION: Bibasilar airspace opacities may reflect pneumonia or mild pulmonary edema. Vascular congestion and borderline cardiomegaly. Small left pleural effusion. Electronically Signed   By: Garald Balding M.D.   On: 09/30/2018 03:23     EKG: Independently reviewed.  Sinus rhythm, QTC 497, LVH, tachycardia, poor R wave progression, LAE, ST depression in V5-V6.   Assessment/Plan Principal Problem:   Acute on chronic respiratory failure with hypoxia ([name]) Active Problems:   COPD (chronic obstructive pulmonary disease) ([name])   Acute on chronic combined systolic and diastolic CHF (congestive heart failure) ([name])   Hypertensive emergency   Type II diabetes mellitus with renal manifestations ([name])   HLD (hyperlipidemia)   Depression with anxiety   CKD (chronic kidney disease), stage  V ([name])   Fluid overload   CAD (coronary artery disease)   Anemia in CKD (chronic kidney disease)   Acute on chronic respiratory failure with hypoxia: Likely due to fluid overload, CHF exacerbation, pulmonary edema and hypertensive emergency.  Blood pressure has improved with nitroglycerin drip.  Patient was started with BiPAP, respiratory distress improved.  -Placed on stepdown bed for observation -Continue BiPAP - Continue nitroglycerin drip for hypertensive urgency - Breathing treatment - High-dose IV Lasix  Hypertensive emergency: Blood pressure 196/121--> 126/87 -Of IV nitroglycerin drip -On high-dose IV Lasix -continue home blood pressure medications: Amlodipine, hydralazine, metoprolol -IV  hydralazine PRN  Fluid overload and acute on chronic combined systolic and diastolic CHF: 2D echo on 3/54/6568 showed EF of 40-45% with grade 1 diastolic dysfunction.  Patient has an elevated BNP and pulmonary edema chest x-ray, consistent with fluid overload and CHF exacerbation. -Lasix 120 mg twice daily -On nitroglycerin drip  CKD (chronic kidney disease), stage V: Recent creatinine 4.12 on 06/25/2018, her creatinine is 5.56, BUN 49 today, worse than baseline.  Patient has a AV fistula placed on the right arm, not started hemodialysis yet. -On high-dose Lasix -if not improve-->may need to start dialysis.  COPD: Patient does not seem to have COPD exacerbation. -Continue bronchodilators, PRN Mucinex for cough  Diet controlled type II diabetes mellitus with renal manifestations ([place]): Last A1c 5.5 on 07/11/2018, well controled. Patient is not taking medications at home.  Blood sugar 354 -SSI  HLD (hyperlipidemia): -Lipitor  Depression and anxiety: Stable, no suicidal or homicidal ideations. -Continue home medications:   CAD (coronary artery disease): Status post stent placement. Currently no chest pain.  Negative troponin. -Continue aspirin, Lipitor, Imdur metoprolol and Plavix  Anemia in CKD (chronic kidney disease): Hemoglobin stable.  9.7 on 09/28/2018, 9.8 now. -Follow-up by CBC    DVT ppx: SQ Heparin     Code Status: Full code Family Communication: None at bed side.    Disposition Plan:  Anticipate discharge back to previous home environment Consults called:  none Admission status:  SDU/obs  Date of Service 09/30/2018    Ivor Costa [facility] Pager [phone]  If 7PM-7AM, please contact night-coverage [email or web address] Password [facility] 09/30/2018, 5:24 AM

## 2018-10-01 DIAGNOSIS — J9621 Acute and chronic respiratory failure with hypoxia: Secondary | ICD-10-CM | POA: Diagnosis not present

## 2018-10-01 LAB — CBC
HCT: 29.8 % — ABNORMAL LOW (ref 36.0–46.0)
Hemoglobin: 8.8 g/dL — ABNORMAL LOW (ref 12.0–15.0)
MCH: 28.4 pg (ref 26.0–34.0)
MCHC: 29.5 g/dL — ABNORMAL LOW (ref 30.0–36.0)
MCV: 96.1 fL (ref 80.0–100.0)
Platelets: 241 K/uL (ref 150–400)
RBC: 3.1 MIL/uL — ABNORMAL LOW (ref 3.87–5.11)
RDW: 15.9 % — ABNORMAL HIGH (ref 11.5–15.5)
WBC: 6.2 K/uL (ref 4.0–10.5)
nRBC: 0 % (ref 0.0–0.2)

## 2018-10-01 LAB — COMPREHENSIVE METABOLIC PANEL
ALT: 15 U/L (ref 0–44)
AST: 13 U/L — ABNORMAL LOW (ref 15–41)
Alkaline Phosphatase: 101 U/L (ref 38–126)
Anion gap: 9 (ref 5–15)
BUN: 60 mg/dL — ABNORMAL HIGH (ref 8–23)
CO2: 16 mmol/L — ABNORMAL LOW (ref 22–32)
Calcium: 8.4 mg/dL — ABNORMAL LOW (ref 8.9–10.3)
Creatinine, Ser: 6.1 mg/dL — ABNORMAL HIGH (ref 0.44–1.00)
GFR calc Af Amer: 7 mL/min — ABNORMAL LOW (ref >60)
Glucose, Bld: 104 mg/dL — ABNORMAL HIGH (ref 70–99)
Sodium: 141 mmol/L (ref 135–145)

## 2018-10-01 LAB — COMPREHENSIVE METABOLIC PANEL WITH GFR
Albumin: 3.1 g/dL — ABNORMAL LOW (ref 3.5–5.0)
Chloride: 116 mmol/L — ABNORMAL HIGH (ref 98–111)
GFR calc non Af Amer: 6 mL/min — ABNORMAL LOW (ref >60)
Potassium: 3.6 mmol/L (ref 3.5–5.1)
Total Bilirubin: 0.5 mg/dL (ref 0.3–1.2)
Total Protein: 6 g/dL — ABNORMAL LOW (ref 6.5–8.1)

## 2018-10-01 LAB — HEMOGLOBIN A1C
HEMOGLOBIN A1C: 5.5 % (ref 4.8–5.6)
Mean Plasma Glucose: 111.15 mg/dL

## 2018-10-01 LAB — MAGNESIUM: Magnesium: 1.8 mg/dL (ref 1.7–2.4)

## 2018-10-01 LAB — GLUCOSE, CAPILLARY
GLUCOSE-CAPILLARY: 130 mg/dL — AB (ref 70–99)
Glucose-Capillary: 103 mg/dL — ABNORMAL HIGH (ref 70–99)

## 2018-10-01 LAB — HIV ANTIBODY (ROUTINE TESTING W REFLEX): HIV Screen 4th Generation wRfx: NONREACTIVE

## 2018-10-01 MED ORDER — HYDRALAZINE HCL 100 MG PO TABS: 100.0000 mg | ORAL_TABLET | Freq: Three times a day (TID) | ORAL | 0 refills | Status: DC

## 2018-10-01 NOTE — Progress Notes (Signed)
Initial Nutrition Assessment  DOCUMENTATION CODES:   Non-severe (moderate) malnutrition in context of chronic illness  INTERVENTION:   - Provided low sodium diet education  - Nepro Shake po BID, each supplement provides 425 kcal and 19 grams protein  NUTRITION DIAGNOSIS:   Moderate Malnutrition related to chronic illness (COPD, CHF, CKD-V) as evidenced by moderate fat depletion, moderate muscle depletion.  GOAL:   Patient will meet greater than or equal to 90% of their needs  MONITOR:   PO intake, Supplement acceptance, Weight trends, Labs, I & O's  REASON FOR ASSESSMENT:   Malnutrition Screening Tool    ASSESSMENT:   67 year old female who presented to the ED on 11/15 with SOB. PMH significant for COPD, CHF, CAD s/p stent, hypertension, hyperlipidemia, diet-controlled diabetes, depression, anxiety, lung cancer s/p surgery (no chemoradiation per pt report), and CKD-V (used to be on peritoneal dialysis). Pt now with AV fistula in right forearm not currently on dialysis.  Spoke with pt at bedside who reports poor appetite over the past 1 year and states it started when "I got sick last time." Pt states that her appetite "comes and goes." Pt endorses weight loss during this time and reports her UBW as "almost over 200." Pt states that she last weighed this approximately 1 year ago.  Pt reports typically eats 1-2 meals daily. A meal may include ramen noodles with an egg or sandwich. Pt drinks water and ginger ale.  Pt with questions related to diet for CKD and CHF. RD provided low sodium diet education. Pt very appreciative and states she will be making some changes in her diet after discharge.  Medications reviewed and include: Calcitriol, Nepro BID, SSI  Labs reviewed: BUN 60 (H), creatinine 6.10 (H) CBG's: 103, 98, 132, 85 x 24 hours  UOP: 500 ml since admit  NUTRITION - FOCUSED PHYSICAL EXAM:    Most Recent Value  Orbital Region  Moderate depletion  Upper Arm Region   Moderate depletion  Thoracic and Lumbar Region  Moderate depletion  Buccal Region  Mild depletion  Temple Region  Mild depletion  Clavicle Bone Region  Moderate depletion  Clavicle and Acromion Bone Region  Moderate depletion  Scapular Bone Region  Moderate depletion  Dorsal Hand  Mild depletion  Patellar Region  Moderate depletion  Anterior Thigh Region  Moderate depletion  Posterior Calf Region  Moderate depletion  Edema (RD Assessment)  None  Hair  Reviewed  Eyes  Reviewed  Mouth  Reviewed  Skin  Reviewed  Nails  Reviewed       Diet Order:   Diet Order            Diet - low sodium heart healthy        Diet renal with fluid restriction Fluid restriction: 1200 mL Fluid; Room service appropriate? Yes; Fluid consistency: Thin  Diet effective now              EDUCATION NEEDS:   Education needs have been addressed  Skin:  Skin Assessment: Reviewed RN Assessment  Last BM:  11/14  Height:   Ht Readings from Last 1 Encounters:  09/30/18 5\' 5"  (1.651 m)    Weight:   Wt Readings from Last 1 Encounters:  10/01/18 60 kg    Ideal Body Weight:  56.82 kg  BMI:  Body mass index is 22.01 kg/m.  Estimated Nutritional Needs:   Kcal:  1600-1800  Protein:  80-95 grams  Fluid:  UOP + 1000 ml    Anda Kraft  Darrold Span, MS, RD, [place] Dietitian Pager: [phone] Weekend/After Hours: [phone]

## 2018-10-01 NOTE — Care Management Obs Status (Signed)
[facility] NOTIFICATION   Patient Details  Name: Kari Robinson MRN: [id number] Date of Birth: 04/20/1951   Medicare Observation Status Notification Given:  Yes    Carles Collet, RN 10/01/2018, 10:25 AM

## 2018-10-01 NOTE — Discharge Instructions (Signed)

## 2018-10-01 NOTE — Discharge Summary (Signed)
Physician Discharge Summary  RAKIA FRAYNE RSW:[id number] DOB: 01-03-1951 DOA: 09/30/2018  PCP: Patient, [name]  Admit date: 09/30/2018 Discharge date: 10/01/2018  Admitted From: Home Disposition: Home  Recommendations for Outpatient Follow-up:  1. Follow up with PCP in 1-2 weeks 2. Please obtain BMP/CBC in one week 3. Please check your blood pressure every day if you can  Home Health: No Equipment/Devices: None  Discharge Condition: Stable CODE STATUS: Full Diet recommendation: Renal diet  Brief/Interim Summary:  #) Hypertensive emergency: Patient was admitted with shortness of breath and hypertensive emergency.  She was initially on a nitroglycerin drip.  She was continued on her home amlodipine, isosorbide mononitrate, metoprolol succinate.  Her hydralazine was increased to 100 mg 3 times daily.  Nitroglycerin drip was rapidly weaned off and patient's blood pressure was stable for 24 hours.  She was told to check her blood pressure at home.  #) stage V CKD: Patient's home torsemide was held.  She was given IV diuretics for 1 day.  Her creatinine was relatively stable.  She was told to restart her home torsemide on discharge.  Patient does have a fistula this is pending maturation for initiation of dialysis.  #) chronic ischemic systolic heart failure: Patient was continued on beta-blocker, aspirin.  She cannot tolerate an ACE inhibitor or ARB due to her progression of kidney disease.  #) Coronary artery disease: Patient was continued on clopidogrel, beta-blocker.  #) COPD: Patient was continued on home inhalers.  Discharge Diagnoses:  Principal Problem:   Acute on chronic respiratory failure with hypoxia ([name]) Active Problems:   COPD (chronic obstructive pulmonary disease) ([name])   Acute on chronic combined systolic and diastolic CHF (congestive heart failure) ([name])   Hypertensive emergency   Type II diabetes mellitus with renal manifestations ([name])   HLD  (hyperlipidemia)   Depression with anxiety   CKD (chronic kidney disease), stage V ([name])   Fluid overload   CAD (coronary artery disease)   Anemia in CKD (chronic kidney disease)    Discharge Instructions  Discharge Instructions    Call MD for:  difficulty breathing, headache or visual disturbances   Complete by:  As directed    Call MD for:  hives   Complete by:  As directed    Call MD for:  persistant dizziness or light-headedness   Complete by:  As directed    Call MD for:  persistant nausea and vomiting   Complete by:  As directed    Call MD for:  redness, tenderness, or signs of infection (pain, swelling, redness, odor or green/yellow discharge around incision site)   Complete by:  As directed    Call MD for:  severe uncontrolled pain   Complete by:  As directed    Call MD for:  temperature >100.4   Complete by:  As directed    Diet - low sodium heart healthy   Complete by:  As directed    Discharge instructions   Complete by:  As directed    Please follow-up with your primary care doctor in 1 week.  Please check your blood pressure every day.  If you do not cannot afford a blood pressure cuff please go to the local pharmacy and check your blood pressure at least once a week.   Increase activity slowly   Complete by:  As directed      Allergies as of 10/01/2018      Reactions   Citalopram Hives   Morphine And Related Itching  Medication List    TAKE these medications   albuterol (2.5 MG/3ML) 0.083% nebulizer solution Commonly known as:  PROVENTIL Take 2.5 mg by nebulization every 6 (six) hours as needed for wheezing or shortness of breath.   VENTOLIN HFA 108 (90 Base) MCG/ACT inhaler Generic drug:  albuterol Inhale 1-2 puffs into the lungs every 6 (six) hours as needed for wheezing or shortness of breath.   ALPRAZolam 0.25 MG tablet Commonly known as:  XANAX Take 0.25 mg by mouth at bedtime.   amLODipine 10 MG tablet Commonly known as:  NORVASC Take  10 mg by mouth daily.   aspirin EC 81 MG tablet Take 81 mg by mouth daily.   buPROPion 150 MG 12 hr tablet Commonly known as:  WELLBUTRIN SR Take 150 mg by mouth 2 (two) times daily.   calcitRIOL 0.25 MCG capsule Commonly known as:  ROCALTROL Take 0.25 mcg by mouth daily.   clopidogrel 75 MG tablet Commonly known as:  PLAVIX Take 75 mg by mouth daily.   hydrALAZINE 100 MG tablet Commonly known as:  APRESOLINE Take 1 tablet (100 mg total) by mouth 3 (three) times daily. What changed:    medication strength  how much to take   isosorbide mononitrate 30 MG 24 hr tablet Commonly known as:  IMDUR Take 30 mg by mouth daily.   metoprolol succinate 50 MG 24 hr tablet Commonly known as:  TOPROL-XL Take 50 mg by mouth daily. Take with or immediately following a meal.   torsemide 20 MG tablet Commonly known as:  DEMADEX Take 40 mg by mouth daily.   TRELEGY ELLIPTA 100-62.5-25 MCG/INH Aepb Generic drug:  Fluticasone-Umeclidin-Vilant Inhale 1 puff into the lungs daily.       Allergies  Allergen Reactions  . Citalopram Hives  . Morphine And Related Itching    Consultations:  None   Procedures/Studies: Dg Chest Portable 1 View  Result Date: 09/30/2018 CLINICAL DATA:  Acute onset of shortness of breath. EXAM: PORTABLE CHEST 1 VIEW COMPARISON:  None. FINDINGS: The lungs are well-aerated. Bibasilar airspace opacities may reflect pneumonia or mild pulmonary edema. Vascular congestion is noted. A small left pleural effusion is noted. No pneumothorax is seen. The cardiomediastinal silhouette is borderline enlarged. No acute osseous abnormalities are seen. A calcified granuloma is noted at the right lung apex. IMPRESSION: Bibasilar airspace opacities may reflect pneumonia or mild pulmonary edema. Vascular congestion and borderline cardiomegaly. Small left pleural effusion. Electronically Signed   By: Garald Balding M.D.   On: 09/30/2018 03:23        Subjective:   Discharge Exam: Vitals:   10/01/18 0413 10/01/18 0831  BP: 121/65 108/75  Pulse: (!) 32 (!) 58  Resp:    Temp: 98 F (36.7 C) 98.9 F (37.2 C)  SpO2:  98%   Vitals:   09/30/18 1955 09/30/18 2109 10/01/18 0413 10/01/18 0831  BP:  (!) 141/76 121/65 108/75  Pulse:   (!) 32 (!) 58  Resp:      Temp:   98 F (36.7 C) 98.9 F (37.2 C)  TempSrc:   Oral Oral  SpO2: 96%   98%  Weight:   60 kg   Height:       General exam: Appears calm and comfortable  Respiratory system: Clear to auscultation. Respiratory effort normal.  No wheezes, crackles, rhonchi Cardiovascular system: Regular rate and rhythm, no murmurs Gastrointestinal system: Soft, nondistended, no rebound or guarding, plus bowel sounds Central nervous system: Alert and oriented.  Grossly  intact, moving all extremities Extremities: Immature fistula site with thrill and bruit Skin: No rashes over visible skin Psychiatry: Judgement and insight appear normal. Mood & affect appropriate.   The results of significant diagnostics from this hospitalization (including imaging, microbiology, ancillary and laboratory) are listed below for reference.     Microbiology: Recent Results (from the past 240 hour(s))  MRSA PCR Screening     Status: None   Collection Time: 09/30/18  1:15 PM  Result Value Ref Range Status   MRSA by PCR NEGATIVE NEGATIVE Final    Comment:        The GeneXpert MRSA Assay (FDA approved for NASAL specimens only), is one component of a comprehensive MRSA colonization surveillance program. It is not intended to diagnose MRSA infection nor to guide or monitor treatment for MRSA infections. Performed at [facility], [place] [address] [place], [place] [address]      Labs: BNP (last 3 results) Recent Labs    09/30/18 0256  BNP >9,450.3*   Basic Metabolic Panel: Recent Labs  Lab 09/30/18 0256 10/01/18 0434  NA 141 141  K 3.7 3.6  CL 114* 116*  CO2 12* 16*   GLUCOSE 354* 104*  BUN 49* 60*  CREATININE 5.56* 6.10*  CALCIUM 9.3 8.4*  MG 2.1 1.8   Liver Function Tests: Recent Labs  Lab 09/30/18 0256 10/01/18 0434  AST 28 13*  ALT 16 15  ALKPHOS 124 101  BILITOT 0.5 0.5  PROT 6.7 6.0*  ALBUMIN 3.5 3.1*   No results for input(s): LIPASE, AMYLASE in the last 168 hours. No results for input(s): AMMONIA in the last 168 hours. CBC: Recent Labs  Lab 09/30/18 0256 10/01/18 0434  WBC 11.3* 6.2  HGB 9.8* 8.8*  HCT 35.8* 29.8*  MCV 102.0* 96.1  PLT 327 241   Cardiac Enzymes: No results for input(s): CKTOTAL, CKMB, CKMBINDEX, TROPONINI in the last 168 hours. BNP: Invalid input(s): POCBNP CBG: Recent Labs  Lab 09/30/18 0754 09/30/18 1257 09/30/18 1624 09/30/18 2122 10/01/18 0730  GLUCAP 115* 85 132* 98 103*   D-Dimer No results for input(s): DDIMER in the last 72 hours. Hgb A1c Recent Labs    10/01/18 0434  HGBA1C 5.5   Lipid Profile No results for input(s): CHOL, HDL, LDLCALC, TRIG, CHOLHDL, LDLDIRECT in the last 72 hours. Thyroid function studies No results for input(s): TSH, T4TOTAL, T3FREE, THYROIDAB in the last 72 hours.  Invalid input(s): FREET3 Anemia work up No results for input(s): VITAMINB12, FOLATE, FERRITIN, TIBC, IRON, RETICCTPCT in the last 72 hours. Urinalysis No results found for: COLORURINE, APPEARANCEUR, [place], [place], [place], [place], [place], [place], PROTEINUR, UROBILINOGEN, NITRITE, LEUKOCYTESUR Sepsis Labs Invalid input(s): PROCALCITONIN,  WBC,  LACTICIDVEN Microbiology Recent Results (from the past 240 hour(s))  MRSA PCR Screening     Status: None   Collection Time: 09/30/18  1:15 PM  Result Value Ref Range Status   MRSA by PCR NEGATIVE NEGATIVE Final    Comment:        The GeneXpert MRSA Assay (FDA approved for NASAL specimens only), is one component of a comprehensive MRSA colonization surveillance program. It is not intended to diagnose MRSA infection nor to guide  or monitor treatment for MRSA infections. Performed at [facility], [place] [address] [place], [place] [address]      Time coordinating discharge:35  SIGNED:   Cristy Folks, MD  [facility] 10/01/2018, 10:15 AM  If 7PM-7AM, please contact night-coverage [email or web address] Password [email or web address]

## 2018-10-05 ENCOUNTER — Encounter (HOSPITAL_COMMUNITY): Payer: Medicare HMO

## 2018-10-12 ENCOUNTER — Encounter (HOSPITAL_COMMUNITY): Payer: Medicare HMO

## 2019-01-24 ENCOUNTER — Inpatient Hospital Stay (HOSPITAL_COMMUNITY): Admission: RE | Admit: 2019-01-24 | Payer: Medicare HMO | Source: Ambulatory Visit

## 2019-01-31 ENCOUNTER — Encounter (HOSPITAL_COMMUNITY): Payer: Medicare Other

## 2019-02-24 ENCOUNTER — Emergency Department (HOSPITAL_COMMUNITY): Payer: Medicare Other

## 2019-02-24 ENCOUNTER — Other Ambulatory Visit: Payer: Self-pay

## 2019-02-24 ENCOUNTER — Observation Stay (HOSPITAL_COMMUNITY)
Admission: EM | Admit: 2019-02-24 | Discharge: 2019-02-26 | Disposition: A | Discharge status: Home / Self Care | Payer: Medicare Other | Attending: Internal Medicine | Admitting: Internal Medicine

## 2019-02-24 ENCOUNTER — Encounter (HOSPITAL_COMMUNITY): Payer: Self-pay | Admitting: Physician Assistant

## 2019-02-24 DIAGNOSIS — N186 End stage renal disease: Secondary | ICD-10-CM | POA: Diagnosis not present

## 2019-02-24 DIAGNOSIS — I214 Non-ST elevation (NSTEMI) myocardial infarction: Secondary | ICD-10-CM | POA: Insufficient documentation

## 2019-02-24 DIAGNOSIS — I2510 Atherosclerotic heart disease of native coronary artery without angina pectoris: Secondary | ICD-10-CM | POA: Diagnosis not present

## 2019-02-24 DIAGNOSIS — F329 Major depressive disorder, single episode, unspecified: Secondary | ICD-10-CM | POA: Diagnosis not present

## 2019-02-24 DIAGNOSIS — I255 Ischemic cardiomyopathy: Secondary | ICD-10-CM | POA: Insufficient documentation

## 2019-02-24 DIAGNOSIS — I5040 Unspecified combined systolic (congestive) and diastolic (congestive) heart failure: Secondary | ICD-10-CM | POA: Insufficient documentation

## 2019-02-24 DIAGNOSIS — F418 Other specified anxiety disorders: Secondary | ICD-10-CM | POA: Diagnosis present

## 2019-02-24 DIAGNOSIS — J449 Chronic obstructive pulmonary disease, unspecified: Secondary | ICD-10-CM | POA: Insufficient documentation

## 2019-02-24 DIAGNOSIS — Z7982 Long term (current) use of aspirin: Secondary | ICD-10-CM | POA: Insufficient documentation

## 2019-02-24 DIAGNOSIS — Z79899 Other long term (current) drug therapy: Secondary | ICD-10-CM | POA: Diagnosis not present

## 2019-02-24 DIAGNOSIS — F1010 Alcohol abuse, uncomplicated: Secondary | ICD-10-CM | POA: Insufficient documentation

## 2019-02-24 DIAGNOSIS — I132 Hypertensive heart and chronic kidney disease with heart failure and with stage 5 chronic kidney disease, or end stage renal disease: Secondary | ICD-10-CM | POA: Insufficient documentation

## 2019-02-24 DIAGNOSIS — Z7902 Long term (current) use of antithrombotics/antiplatelets: Secondary | ICD-10-CM | POA: Insufficient documentation

## 2019-02-24 DIAGNOSIS — E1122 Type 2 diabetes mellitus with diabetic chronic kidney disease: Secondary | ICD-10-CM | POA: Insufficient documentation

## 2019-02-24 DIAGNOSIS — I469 Cardiac arrest, cause unspecified: Secondary | ICD-10-CM

## 2019-02-24 DIAGNOSIS — R55 Syncope and collapse: Secondary | ICD-10-CM | POA: Diagnosis not present

## 2019-02-24 DIAGNOSIS — R569 Unspecified convulsions: Secondary | ICD-10-CM | POA: Diagnosis present

## 2019-02-24 DIAGNOSIS — F17210 Nicotine dependence, cigarettes, uncomplicated: Secondary | ICD-10-CM | POA: Diagnosis not present

## 2019-02-24 LAB — CBC WITH DIFFERENTIAL/PLATELET
Abs Immature Granulocytes: 0.08 10*3/uL — ABNORMAL HIGH (ref 0.00–0.07)
Basophils Absolute: 0 10*3/uL (ref 0.0–0.1)
Basophils Relative: 1 %
Eosinophils Absolute: 0.2 10*3/uL (ref 0.0–0.5)
Eosinophils Relative: 3 %
HCT: 30.9 % — ABNORMAL LOW (ref 36.0–46.0)
Hemoglobin: 9.3 g/dL — ABNORMAL LOW (ref 12.0–15.0)
Immature Granulocytes: 1 %
Lymphocytes Relative: 15 %
Lymphs Abs: 1.1 10*3/uL (ref 0.7–4.0)
MCH: 31.2 pg (ref 26.0–34.0)
MCHC: 30.1 g/dL (ref 30.0–36.0)
MCV: 103.7 fL — ABNORMAL HIGH (ref 80.0–100.0)
Monocytes Absolute: 0.8 10*3/uL (ref 0.1–1.0)
Monocytes Relative: 11 %
Neutro Abs: 5.1 10*3/uL (ref 1.7–7.7)
Neutrophils Relative %: 69 %
Platelets: 232 10*3/uL (ref 150–400)
RBC: 2.98 MIL/uL — ABNORMAL LOW (ref 3.87–5.11)
RDW: 17.2 % — ABNORMAL HIGH (ref 11.5–15.5)
WBC: 7.4 10*3/uL (ref 4.0–10.5)
nRBC: 0 % (ref 0.0–0.2)

## 2019-02-24 LAB — COMPREHENSIVE METABOLIC PANEL
ALT: 21 U/L (ref 0–44)
AST: 39 U/L (ref 15–41)
Albumin: 3.9 g/dL (ref 3.5–5.0)
Alkaline Phosphatase: 99 U/L (ref 38–126)
Anion gap: 25 — ABNORMAL HIGH (ref 5–15)
BUN: 7 mg/dL — ABNORMAL LOW (ref 8–23)
CO2: 23 mmol/L (ref 22–32)
Calcium: 8.4 mg/dL — ABNORMAL LOW (ref 8.9–10.3)
Chloride: 88 mmol/L — ABNORMAL LOW (ref 98–111)
Creatinine, Ser: 2.47 mg/dL — ABNORMAL HIGH (ref 0.44–1.00)
GFR calc Af Amer: 22 mL/min — ABNORMAL LOW (ref >60)
GFR calc non Af Amer: 19 mL/min — ABNORMAL LOW (ref >60)
Glucose, Bld: 165 mg/dL — ABNORMAL HIGH (ref 70–99)
Potassium: 3.5 mmol/L (ref 3.5–5.1)
Sodium: 136 mmol/L (ref 135–145)
Total Bilirubin: 0.4 mg/dL (ref 0.3–1.2)
Total Protein: 7.1 g/dL (ref 6.5–8.1)

## 2019-02-24 LAB — POCT I-STAT EG7
Acid-Base Excess: 1 mmol/L (ref 0.0–2.0)
Bicarbonate: 25.1 mmol/L (ref 20.0–28.0)
Calcium, Ion: 0.92 mmol/L — ABNORMAL LOW (ref 1.15–1.40)
HCT: 31 % — ABNORMAL LOW (ref 36.0–46.0)
Hemoglobin: 10.5 g/dL — ABNORMAL LOW (ref 12.0–15.0)
O2 Saturation: 85 %
Potassium: 3.4 mmol/L — ABNORMAL LOW (ref 3.5–5.1)
Sodium: 135 mmol/L (ref 135–145)
TCO2: 26 mmol/L (ref 22–32)
pCO2, Ven: 38.6 mmHg — ABNORMAL LOW (ref 44.0–60.0)
pH, Ven: 7.422 (ref 7.250–7.430)
pO2, Ven: 49 mmHg — ABNORMAL HIGH (ref 32.0–45.0)

## 2019-02-24 LAB — TROPONIN I
Troponin I: 0.04 ng/mL (ref <0.03)
Troponin I: 0.06 ng/mL (ref <0.03)

## 2019-02-24 LAB — ETHANOL: Alcohol, Ethyl (B): <10 mg/dL (ref <10)

## 2019-02-24 MED ORDER — ALBUTEROL SULFATE (2.5 MG/3ML) 0.083% IN NEBU: 2.5000 mg | INHALATION_SOLUTION | RESPIRATORY_TRACT | Status: DC | PRN

## 2019-02-24 MED ORDER — UMECLIDINIUM BROMIDE 62.5 MCG/INH IN AEPB
1.0000 | INHALATION_SPRAY | Freq: Every day | RESPIRATORY_TRACT | Status: DC
  Administered 2019-02-25 – 2019-02-26 (×2): 1 via RESPIRATORY_TRACT
  Filled 2019-02-24: qty 7

## 2019-02-24 MED ORDER — METOPROLOL SUCCINATE ER 25 MG PO TB24
50.0000 mg | ORAL_TABLET | Freq: Every day | ORAL | Status: DC
  Administered 2019-02-25: 50 mg via ORAL
  Filled 2019-02-24: qty 2

## 2019-02-24 MED ORDER — FLUTICASONE-UMECLIDIN-VILANT 100-62.5-25 MCG/INH IN AEPB: 1.0000 | INHALATION_SPRAY | Freq: Every day | RESPIRATORY_TRACT | Status: DC

## 2019-02-24 MED ORDER — ONDANSETRON HCL 4 MG/2ML IJ SOLN: 4.0000 mg | Freq: Four times a day (QID) | INTRAMUSCULAR | Status: DC | PRN

## 2019-02-24 MED ORDER — ASPIRIN 300 MG RE SUPP: 300.0000 mg | RECTAL | Status: AC

## 2019-02-24 MED ORDER — ASPIRIN EC 81 MG PO TBEC
81.0000 mg | DELAYED_RELEASE_TABLET | Freq: Every day | ORAL | Status: DC
  Administered 2019-02-25 – 2019-02-26 (×2): 81 mg via ORAL
  Filled 2019-02-24 (×2): qty 1

## 2019-02-24 MED ORDER — FLUTICASONE FUROATE-VILANTEROL 100-25 MCG/INH IN AEPB
1.0000 | INHALATION_SPRAY | Freq: Every day | RESPIRATORY_TRACT | Status: DC
  Administered 2019-02-25 – 2019-02-26 (×2): 1 via RESPIRATORY_TRACT
  Filled 2019-02-24: qty 28

## 2019-02-24 MED ORDER — ACETAMINOPHEN 325 MG PO TABS
650.0000 mg | ORAL_TABLET | ORAL | Status: DC | PRN
  Administered 2019-02-24: 22:00:00 650 mg via ORAL
  Filled 2019-02-24: qty 2

## 2019-02-24 MED ORDER — QUETIAPINE FUMARATE 50 MG PO TABS
50.0000 mg | ORAL_TABLET | Freq: Every day | ORAL | Status: DC
  Administered 2019-02-24: 50 mg via ORAL
  Filled 2019-02-24: qty 1

## 2019-02-24 MED ORDER — FENTANYL CITRATE (PF) 100 MCG/2ML IJ SOLN
25.0000 ug | INTRAMUSCULAR | Status: DC | PRN
  Administered 2019-02-24 – 2019-02-25 (×2): 50 ug via INTRAVENOUS
  Administered 2019-02-25: 10:00:00 25 ug via INTRAVENOUS
  Administered 2019-02-25: 50 ug via INTRAVENOUS
  Filled 2019-02-24 (×3): qty 2

## 2019-02-24 MED ORDER — HYDRALAZINE HCL 50 MG PO TABS
100.0000 mg | ORAL_TABLET | Freq: Three times a day (TID) | ORAL | Status: DC
  Administered 2019-02-24 – 2019-02-26 (×4): 100 mg via ORAL
  Filled 2019-02-24 (×4): qty 2

## 2019-02-24 MED ORDER — ATORVASTATIN CALCIUM 40 MG PO TABS
40.0000 mg | ORAL_TABLET | Freq: Every day | ORAL | Status: DC
  Administered 2019-02-25: 40 mg via ORAL
  Filled 2019-02-24: qty 1

## 2019-02-24 MED ORDER — FENTANYL CITRATE (PF) 100 MCG/2ML IJ SOLN
50.0000 ug | Freq: Once | INTRAMUSCULAR | Status: AC
  Administered 2019-02-24: 20:00:00 50 ug via INTRAVENOUS
  Filled 2019-02-24: qty 2

## 2019-02-24 MED ORDER — SODIUM CHLORIDE 0.9 % IV BOLUS
250.0000 mL | Freq: Once | INTRAVENOUS | Status: AC
  Administered 2019-02-24: 19:00:00 250 mL via INTRAVENOUS

## 2019-02-24 MED ORDER — TORSEMIDE 20 MG PO TABS
40.0000 mg | ORAL_TABLET | Freq: Every day | ORAL | Status: DC
  Administered 2019-02-25 – 2019-02-26 (×2): 40 mg via ORAL
  Filled 2019-02-24 (×2): qty 2

## 2019-02-24 MED ORDER — ISOSORBIDE MONONITRATE ER 30 MG PO TB24
30.0000 mg | ORAL_TABLET | Freq: Every day | ORAL | Status: DC
  Administered 2019-02-25 – 2019-02-26 (×2): 30 mg via ORAL
  Filled 2019-02-24 (×2): qty 1

## 2019-02-24 MED ORDER — HEPARIN SODIUM (PORCINE) 5000 UNIT/ML IJ SOLN
5000.0000 [IU] | Freq: Three times a day (TID) | INTRAMUSCULAR | Status: DC
  Administered 2019-02-24 – 2019-02-26 (×4): 5000 [IU] via SUBCUTANEOUS
  Filled 2019-02-24 (×4): qty 1

## 2019-02-24 MED ORDER — CLOPIDOGREL BISULFATE 75 MG PO TABS
75.0000 mg | ORAL_TABLET | Freq: Every day | ORAL | Status: DC
  Administered 2019-02-25 – 2019-02-26 (×2): 75 mg via ORAL
  Filled 2019-02-24 (×2): qty 1

## 2019-02-24 MED ORDER — NITROGLYCERIN 0.4 MG SL SUBL: 0.4000 mg | SUBLINGUAL_TABLET | SUBLINGUAL | Status: DC | PRN

## 2019-02-24 MED ORDER — ASPIRIN 81 MG PO CHEW
324.0000 mg | CHEWABLE_TABLET | ORAL | Status: AC
  Administered 2019-02-24: 22:00:00 324 mg via ORAL
  Filled 2019-02-24: qty 4

## 2019-02-24 MED ORDER — FENTANYL CITRATE (PF) 100 MCG/2ML IJ SOLN
INTRAMUSCULAR | Status: AC
  Filled 2019-02-24: qty 2

## 2019-02-24 NOTE — ED Provider Notes (Signed)
[facility] EMERGENCY DEPARTMENT Provider Note   CSN: [id number] Arrival date & time: 02/24/19  1632    History   Chief Complaint Chief Complaint  Patient presents with   Seizures   Cardiac Arrest    HPI Kari Robinson is a 68 y.o. female with a past medical history of CHF, CAD, COPD, hypertension, renal artery stenosis, ESRD on HD, alcohol abuse, hypertension, who presents today for evaluation after a possible cardiac arrest.  She was to [facility] for dialysis and completed her dialysis when she was waiting at the bus stop and according to staff was noted to have a seizure.  Once this stopped she reportedly did not have a pulse per dialysis staff and they performed approximately 4 minutes of BLS CPR.  No shocks were advised.  EMS reports that patient initially had a GCS of 3 however became much more time.  On EMS arrival they were able to detect a fast femoral pulse.  EMS placed bilateral NPA, put on NRB.  Patient usually wears 2 L of oxygen nasal cannula at home.  She does not have any seizure history.  She reports that recently she has been well.  She denies any fevers.  She does have a cough however says that it is her usual cough.  She denies any chest pain, shortness of breath, or headache.     HPI  Past Medical History:  Diagnosis Date   Acute on chronic heart failure ([name])    Acute respiratory failure with hypoxia ([name])    Alcohol abuse    Arthralgia    CAD (coronary artery disease)    moderate primarily septal perforater   CHF (congestive heart failure) ([name])    COPD (chronic obstructive pulmonary disease) ([name])    Depression    HTN (hypertension)    Hypercholesterolemia    primarily ldl-p and small particles   RAS (renal artery stenosis) ([place]) 2002   by cath tysinger   Renal disorder    Smoking     Patient Active Problem List   Diagnosis Date Noted   Transient loss of consciousness 02/24/2019   Hypertensive emergency  09/30/2018   Type II diabetes mellitus with renal manifestations ([place]) 09/30/2018   HLD (hyperlipidemia) 09/30/2018   Depression with anxiety 09/30/2018   CKD (chronic kidney disease), stage V ([place]) 09/30/2018   Acute on chronic respiratory failure with hypoxia ([place]) 09/30/2018   Fluid overload 09/30/2018   CAD (coronary artery disease) 09/30/2018   Anemia in CKD (chronic kidney disease) 09/30/2018   COPD (chronic obstructive pulmonary disease) ([place])    Acute on chronic combined systolic and diastolic CHF (congestive heart failure) ([place])    Solitary right kidney 80/99/8338   Chronic systolic CHF (congestive heart failure) ([place]) 06/24/2018   Acute CHF (congestive heart failure) ([place]) 06/07/2018   Hypertensive emergency 06/07/2018   CKD (chronic kidney disease) stage 5, GFR less than 15 ml/min ([name]) 06/07/2018   Normocytic normochromic anemia 06/07/2018   DM (diabetes mellitus), type 2 with renal complications ([place]) 25/03/3975   Hypertensive urgency 06/07/2018   Occlusion and stenosis of carotid artery without mention of cerebral infarction 01/19/2012   RENAL ARTERY STENOSIS 09/10/2010   ABDOMINAL BRUIT 08/12/2010   DEPRESSION 08/11/2010   PERIPHERAL VASCULAR DISEASE 08/11/2010   HYPERTENSION 08/08/2010   Coronary atherosclerosis 08/08/2010   ARTHRALGIA 08/08/2010    Past Surgical History:  Procedure Laterality Date   abdominal aortogram     perclose of the right femoral artery  AV FISTULA PLACEMENT Right 07/11/2018   Procedure: Right arm ARTERIOVENOUS FISTULA CREATION;  Surgeon: [name], MD;  Location: [facility] OR;  Service: Vascular;  Laterality: Right;   AVF placement Right    CARDIAC CATHETERIZATION     left heart catheterization.  Coronary cineangiography. Lft ventricular cineangiography.     LUNG SURGERY     due to lung cancer per patient's report   TUBAL LIGATION       OB History   No obstetric history on file.      Home  Medications    Prior to Admission medications   Medication Sig Start Date End Date Taking? Authorizing Provider  albuterol (PROVENTIL HFA;VENTOLIN HFA) 108 (90 Base) MCG/ACT inhaler Inhale 4 puffs into the lungs every 4 (four) hours as needed for wheezing. 11/25/17   [provider]  albuterol (PROVENTIL) (2.5 MG/3ML) 0.083% nebulizer solution Take 2.5 mg by nebulization every 6 (six) hours as needed for wheezing or shortness of breath.  07/28/18   [provider]  ALPRAZolam Duanne Moron) 0.25 MG tablet Take 0.25 mg by mouth at bedtime as needed for sleep. 06/16/18   [provider]  ALPRAZolam Duanne Moron) 0.25 MG tablet Take 0.25 mg by mouth at bedtime. 09/03/18   [provider]  amLODipine (NORVASC) 10 MG tablet Take 10 mg by mouth daily.    [provider]  amLODipine (NORVASC) 10 MG tablet Take 10 mg by mouth daily.    [provider]  aspirin 81 MG tablet Take 81 mg by mouth daily.    [provider]  aspirin EC 81 MG tablet Take 81 mg by mouth daily.    [provider]  atorvastatin (LIPITOR) 40 MG tablet Take 40 mg by mouth daily.    [provider]  buPROPion (WELLBUTRIN SR) 150 MG 12 hr tablet Take 150 mg by mouth 2 (two) times daily. 03/17/18   [provider]  buPROPion (WELLBUTRIN SR) 150 MG 12 hr tablet Take 150 mg by mouth 2 (two) times daily.    [provider]  calcitRIOL (ROCALTROL) 0.25 MCG capsule Take 0.25 mcg by mouth daily. 07/26/18   [provider]  Cholecalciferol (VITAMIN D) 2000 units tablet Take 2,000 Units by mouth daily. 01/07/18   [provider]  clopidogrel (PLAVIX) 75 MG tablet Take 75 mg by mouth daily.    [provider]  clopidogrel (PLAVIX) 75 MG tablet Take 75 mg by mouth daily. 08/16/18   [provider]  Fluticasone-Umeclidin-Vilant (TRELEGY ELLIPTA) 100-62.5-25 MCG/INH AEPB Inhale 1 puff into the lungs daily. 04/13/18   [provider]  Fluticasone-Umeclidin-Vilant (TRELEGY ELLIPTA) 100-62.5-25 MCG/INH AEPB Inhale 1 puff into the lungs daily.     [provider]  hydrALAZINE (APRESOLINE) 100 MG tablet Take 1 tablet (100 mg total) by mouth 3 (three) times daily. 10/01/18 10/31/18  Purohit, Konrad Dolores, MD  hydrALAZINE (APRESOLINE) 50 MG tablet Take 50 mg by mouth 3 (three) times daily.    [provider]  ipratropium-albuterol (DUONEB) 0.5-2.5 (3) MG/3ML SOLN Take 3 mLs by nebulization every 6 (six) hours as needed. Patient taking differently: Take 3 mLs by nebulization every 6 (six) hours as needed (wheezing).  06/10/18   Bonnell Public, MD  isosorbide mononitrate (IMDUR) 30 MG 24 hr tablet Take 30 mg by mouth daily.    [provider]  isosorbide mononitrate (IMDUR) 30 MG 24 hr tablet Take 30 mg by mouth daily.    [provider]  metoprolol succinate (TOPROL-XL) 50 MG 24 hr tablet Take 50 mg by mouth daily. Take with or immediately following a meal.    [provider]  metoprolol succinate (TOPROL-XL) 50 MG 24 hr tablet Take 50 mg by mouth daily. Take with or immediately following a meal.    [provider]  oxyCODONE-acetaminophen (PERCOCET) 5-325 MG tablet Take 1 tablet by mouth every 6 (six) hours as needed for severe pain. 07/11/18   Rhyne, Hulen Shouts, PA-C  sodium bicarbonate 650 MG tablet Take 1 tablet (650 mg total) by mouth 2 (two) times daily. 06/09/18   Bonnell Public, MD  torsemide (DEMADEX) 20 MG tablet Take 2 tablets (40 mg total) by mouth daily. 06/10/18   Dana Allan I, MD  torsemide (DEMADEX) 20 MG tablet Take 40 mg by mouth daily.    [provider]  VENTOLIN HFA 108 (90 Base) MCG/ACT inhaler Inhale 1-2 puffs into the lungs every 6 (six) hours as needed for wheezing or shortness of breath.  08/31/18   [provider]    Family History Family History  Problem Relation Age of Onset   Emphysema Father     Social  History Social History   Tobacco Use   Smoking status: Current Every Day Smoker    Years: 35.00    Types: Cigarettes   Smokeless tobacco: Never Used   Tobacco comment: 3-4 cigarettes per day  Substance Use Topics   Alcohol use: Never    Frequency: Never    Comment: states has quit drinking "a few months ago"   Drug use: Never     Allergies   Citalopram hydrobromide; Citalopram; Morphine and related; and Morphine   Review of Systems Review of Systems  Constitutional: Negative for chills and fever.  HENT: Negative for congestion.   Respiratory: Positive for cough. Negative for chest tightness and shortness of breath.   Cardiovascular: Negative for chest pain, palpitations and leg swelling.  Gastrointestinal: Negative for abdominal pain, diarrhea, nausea and vomiting.  Genitourinary: Negative for dysuria.  Musculoskeletal: Negative for back pain and neck pain.  Neurological: Positive for seizures (Possible). Negative for weakness.  Psychiatric/Behavioral: Negative for confusion.  All other systems reviewed and are negative.    Physical Exam Updated Vital Signs BP (!) 164/108    Pulse 97    Temp 97.6 F (36.4 C) (Oral)    Resp 20    Ht 5\' 4"  (1.626 m)    Wt 59 kg    SpO2 94%    BMI 22.31 kg/m   Physical Exam Vitals signs and nursing note reviewed.  Constitutional:      General: She is not in acute distress.    Appearance: She is well-developed. She is not diaphoretic.     Comments: Chronically ill-appearing  HENT:     Head: Normocephalic and atraumatic.     Nose:     Comments: Bilateral NPA present, left one removed    Mouth/Throat:     Mouth: Mucous membranes are moist.  Eyes:     Conjunctiva/sclera: Conjunctivae normal.  Neck:     Comments: Not tested Cardiovascular:     Rate and Rhythm: Tachycardia present. Rhythm irregular.     Heart sounds: Normal heart sounds. No murmur.     Comments: Fistula present right arm. Pulmonary:     Effort: Pulmonary  effort is normal. No respiratory distress.     Breath sounds: Normal breath sounds.  Abdominal:     General: Abdomen is flat.  Palpations: Abdomen is soft. There is no mass.     Tenderness: There is no abdominal tenderness.     Hernia: No hernia is present.  Skin:    General: Skin is warm and dry.  Neurological:     General: No focal deficit present.     Mental Status: She is alert and oriented to person, place, and time.     Cranial Nerves: No cranial nerve deficit.  Psychiatric:        Mood and Affect: Mood normal.        Behavior: Behavior normal.      ED Treatments / Results  Labs (all labs ordered are listed, but only abnormal results are displayed) Labs Reviewed  COMPREHENSIVE METABOLIC PANEL - Abnormal; Notable for the following components:      Result Value   Chloride 88 (*)    Glucose, Bld 165 (*)    BUN 7 (*)    Creatinine, Ser 2.47 (*)    Calcium 8.4 (*)    GFR calc non Af Amer 19 (*)    GFR calc Af Amer 22 (*)    Anion gap 25 (*)    All other components within normal limits  CBC WITH DIFFERENTIAL/PLATELET - Abnormal; Notable for the following components:   RBC 2.98 (*)    Hemoglobin 9.3 (*)    HCT 30.9 (*)    MCV 103.7 (*)    RDW 17.2 (*)    Abs Immature Granulocytes 0.08 (*)    All other components within normal limits  TROPONIN I - Abnormal; Notable for the following components:   Troponin I 0.04 (*)    All other components within normal limits  POCT I-STAT EG7 - Abnormal; Notable for the following components:   pCO2, Ven 38.6 (*)    pO2, Ven 49.0 (*)    Potassium 3.4 (*)    Calcium, Ion 0.92 (*)    HCT 31.0 (*)    Hemoglobin 10.5 (*)    All other components within normal limits  ETHANOL  URINALYSIS, ROUTINE W REFLEX MICROSCOPIC  RAPID URINE DRUG SCREEN, HOSP PERFORMED    EKG EKG Interpretation  Date/Time:  Friday February 24 2019 16:50:35 EDT Ventricular Rate:  103 PR Interval:    QRS Duration: 105 QT Interval:  384 QTC  Calculation: 503 R Axis:   86 Text Interpretation:  Sinus tachycardia Ventricular bigeminy Borderline right axis deviation LVH with secondary repolarization abnormality ST depression, consider ischemia, diffuse lds Prolonged QT interval changed from prior ecg Confirmed by Jola Schmidt [phone]) on 02/24/2019 5:03:11 PM  EKG Interpretation  Date/Time:  Friday February 24 2019 16:50:35 EDT Ventricular Rate:  103 PR Interval:    QRS Duration: 105 QT Interval:  384 QTC Calculation: 503 R Axis:   86 Text Interpretation:  Sinus tachycardia Ventricular bigeminy Borderline right axis deviation LVH with secondary repolarization abnormality ST depression, consider ischemia, diffuse lds Prolonged QT interval changed from prior ecg Confirmed by Jola Schmidt [phone]) on 02/24/2019 5:03:11 PM        Radiology Ct Head Wo Contrast  Result Date: 02/24/2019 CLINICAL DATA:  Seizure. EXAM: CT HEAD WITHOUT CONTRAST CT CERVICAL SPINE WITHOUT CONTRAST TECHNIQUE: Multidetector CT imaging of the head and cervical spine was performed following the standard protocol without intravenous contrast. Multiplanar CT image reconstructions of the cervical spine were also generated. COMPARISON:  None. FINDINGS: CT HEAD FINDINGS Brain: Right frontal encephalomalacia is noted which extends into right basal ganglia and is consistent with old infarction. No  mass effect or midline shift is noted. Ventricular size is within normal limits. There is no evidence of mass lesion, hemorrhage or acute infarction. Vascular: No hyperdense vessel or unexpected calcification. Skull: Normal. Negative for fracture or focal lesion. Sinuses/Orbits: No acute finding. Other: None. CT CERVICAL SPINE FINDINGS Alignment: Normal. Skull base and vertebrae: No acute fracture. No primary bone lesion or focal pathologic process. Soft tissues and spinal canal: No prevertebral fluid or swelling. No visible canal hematoma. Disc levels: Severe degenerative disc disease  is noted at C4-5, C5-6 and C6-7 with anterior posterior osteophyte formation. Upper chest: Negative. Other: None. IMPRESSION: Old right frontal infarction. No acute intracranial abnormality seen. Severe multilevel degenerative disc disease. No acute abnormality seen in the cervical spine. Electronically Signed   By: Marijo Conception, M.D.   On: 02/24/2019 20:34   Ct Cervical Spine Wo Contrast  Result Date: 02/24/2019 CLINICAL DATA:  Seizure. EXAM: CT HEAD WITHOUT CONTRAST CT CERVICAL SPINE WITHOUT CONTRAST TECHNIQUE: Multidetector CT imaging of the head and cervical spine was performed following the standard protocol without intravenous contrast. Multiplanar CT image reconstructions of the cervical spine were also generated. COMPARISON:  None. FINDINGS: CT HEAD FINDINGS Brain: Right frontal encephalomalacia is noted which extends into right basal ganglia and is consistent with old infarction. No mass effect or midline shift is noted. Ventricular size is within normal limits. There is no evidence of mass lesion, hemorrhage or acute infarction. Vascular: No hyperdense vessel or unexpected calcification. Skull: Normal. Negative for fracture or focal lesion. Sinuses/Orbits: No acute finding. Other: None. CT CERVICAL SPINE FINDINGS Alignment: Normal. Skull base and vertebrae: No acute fracture. No primary bone lesion or focal pathologic process. Soft tissues and spinal canal: No prevertebral fluid or swelling. No visible canal hematoma. Disc levels: Severe degenerative disc disease is noted at C4-5, C5-6 and C6-7 with anterior posterior osteophyte formation. Upper chest: Negative. Other: None. IMPRESSION: Old right frontal infarction. No acute intracranial abnormality seen. Severe multilevel degenerative disc disease. No acute abnormality seen in the cervical spine. Electronically Signed   By: Marijo Conception, M.D.   On: 02/24/2019 20:34   Dg Chest Port 1 View  Result Date: 02/24/2019 CLINICAL DATA:  Post CPR  EXAM: PORTABLE CHEST 1 VIEW COMPARISON:  09/30/2018 FINDINGS: Cardiac enlargement without heart failure or edema. Vague airspace disease in the left lung base. Cardiac and mediastinal structures shifted to the left suggesting left lower lobe collapse although pneumonia not excluded. Possible left effusion. Atherosclerotic aortic arch Defibrillator pad overlying the mediastinum. IMPRESSION: Left lower lobe airspace disease with volume loss compatible with atelectasis and possible effusion. Electronically Signed   By: Franchot Gallo M.D.   On: 02/24/2019 17:34    Procedures Procedures (including critical care time)  Medications Ordered in ED Medications  sodium chloride 0.9 % bolus 250 mL (0 mLs Intravenous Stopped 02/24/19 2019)  fentaNYL (SUBLIMAZE) injection 50 mcg (50 mcg Intravenous Given 02/24/19 1945)     Initial Impression / Assessment and Plan / ED Course  I have reviewed the triage vital signs and the nursing notes.  Pertinent labs & imaging results that were available during my care of the patient were reviewed by me and considered in my medical decision making (see chart for details).  Clinical Course as of Feb 24 2128  Fri Feb 24, 2019  1710 Patient reevaluated, asked about alcohol and benzodiazepine use.  She tells me that she does not drink and has not for a while which she clarifies is  more than a month.  She used to be on Xanax however reports that she has not had any benzodiazepines in 2 months.  She does report right sided anterior chest pain.    [EH]  1900 BP 174/101   [EH]  1902 Patient requested pain medicine, blood pressure is 174/101.  She has morphine listed as an allergy, when I asked her what happened she reports that she only gets itching.   [EH]  2128 Spoke with Dr. Myna Hidalgo who will admit the patient.   [EH]    Clinical Course User Index [EH] Lorin Glass, PA-C      Patient presents today for evaluation of seizure-like activity and possible cardiac  arrest.  She had what appeared to be seizure-like activity witnessed outside her dialysis center after she completed dialysis and staff could reportedly not find a pulse.  She had approximately 4 minutes of CPR, upon EMS arrival they were able to palpate a femoral pulse.  Initially GCS was 3 however at the time of arrival she was alert and oriented x3.  She complained of right-sided chest pain with tenderness to palpation, suspect from compressions.   Labs are obtained and reviewed, troponin is slightly elevated at 0.04.  She has an anion gap of 25, primarily due to her chloride being 46 which I suspect is a combination of dialysis and presumed lactic acidosis from either seizure activity or arrest.  Given that this was a first-time seizure, CT head and neck were obtained as unsure if patient fell.  These did not show evidence of acute abnormalities.  She was observed in the emergency room for multiple hours.  She did have frequent PVCs however did not have any additional seizure-like activity or cardiac events.  Given that she had either a first-time seizure or cardiac arrest and is a high syncope risk patient requires admission for continued evaluation and monitoring.    Final Clinical Impressions(s) / ED Diagnoses   Final diagnoses:  Seizure-like activity [facility])  Cardiac arrest [facility])    ED Discharge Orders    None       Ollen Gross 02/24/19 2250    Jola Schmidt, MD 02/24/19 2337

## 2019-02-24 NOTE — ED Notes (Signed)
Attempted report 

## 2019-02-24 NOTE — ED Notes (Signed)
Purewick placed on pt. Waiting to get urine sample.

## 2019-02-24 NOTE — Progress Notes (Signed)
Pt admitted from ED with the diagnosis of seizures, alert and oriented, c/o of c/o pain from CPR, settled in bed with call light at bedside, tele monitor put and verified, safety measures in place, was however reassured and will continue to monitor. Kari Robinson, [name]

## 2019-02-24 NOTE — ED Triage Notes (Signed)
Per [facility], pt from dialysis center w/ a c/o a seizure and possible cardiac arrest. The pt completed dialysis and was waiting at the bus stop when the staff members saw the pt having a seizure. They were unable to find a pulse on her and initiated CPR. They did ~ 4 mins of CPR. No shocks were advised. Pt initially had a GCS of 3 but became more alert with time.   EMS placed bilateral NPAs and provided O2 15 lpm by NRB.   Pt currently has a GCS of 15. CAOx4.

## 2019-02-24 NOTE — H&P (Signed)
History and Physical    Kari Robinson ZYS:[id number] DOB: May 11, 1951 DOA: 02/24/2019  PCP: Patient, [name]   Patient coming from: Home   Chief Complaint: Seizure-like activity, received CPR   HPI: Kari Robinson is a 68 y.o. female with medical history significant for ESRD, hypertension, ischemic cardiomyopathy, COPD with 2 L/min supplemental oxygen requirement, and depression with anxiety, now presenting to the emergency department for evaluation of seizure-like activity and possible cardiac arrest.  Patient reports that she was in her usual state of health, completed her dialysis session without incident, and was waiting on a bench outside for a ride back home when she was noted to have seizure-like activity.  Dialysis center personnel rushed out to assist, were unable to find a pulse immediately, and began CPR.  Patient reportedly received approximately 4 minutes of CPR.  EMS found a femoral pulse upon arrival, GCS was 3 initially, and the patient became fully oriented during transport to the ED.  She felt well, in her usual state, leading up to this event and she denies any chest pain, palpitations, headache, change in vision or hearing, or focal numbness or weakness prior to the event.  She has been experiencing some reproducible anterior chest pain after the chest compressions.  No recent fevers or chills.  No change in her chronic mild cough.  He denies any history of seizures.  She has a history of alcohol abuse and history of benzodiazepine use, but has not used either these in more than a month.  ED Course: Upon arrival to the ED, patient is found to be afebrile, saturating well on 2 L/min of supplemental oxygen, and hypertensive to 180/100.  EKG features a sinus rhythm with PVCs, LVH, repolarization abnormality, inferior ST depressions, and QTc interval of 502 ms.  Chemistry panel is notable for anion gap of 25.  CBC features a stable anemia with hemoglobin 9.3.  Troponin is slightly  elevated at 0.04.  Patient was given 50 mcg of fentanyl and 250 cc normal saline in the ED.  Remains hemodynamically stable, in no apparent respiratory distress, and will be observed for ongoing evaluation and management.  Review of Systems:  All other systems reviewed and apart from HPI, are negative.  Past Medical History:  Diagnosis Date   Acute on chronic heart failure ([name])    Acute respiratory failure with hypoxia ([name])    Alcohol abuse    Arthralgia    CAD (coronary artery disease)    moderate primarily septal perforater   CHF (congestive heart failure) ([name])    COPD (chronic obstructive pulmonary disease) ([name])    Depression    HTN (hypertension)    Hypercholesterolemia    primarily ldl-p and small particles   RAS (renal artery stenosis) ([place]) 2002   by cath tysinger   Renal disorder    Smoking     Past Surgical History:  Procedure Laterality Date   abdominal aortogram     perclose of the right femoral artery   AV FISTULA PLACEMENT Right 07/11/2018   Procedure: Right arm ARTERIOVENOUS FISTULA CREATION;  Surgeon: [name], MD;  Location: [facility] OR;  Service: Vascular;  Laterality: Right;   AVF placement Right    CARDIAC CATHETERIZATION     left heart catheterization.  Coronary cineangiography. Lft ventricular cineangiography.     LUNG SURGERY     due to lung cancer per patient's report   TUBAL LIGATION       reports that she has been  smoking cigarettes. She has smoked for the past 35.00 years. She has never used smokeless tobacco. She reports that she does not drink alcohol or use drugs.  Allergies  Allergen Reactions   Citalopram Hydrobromide Hives   Citalopram Hives   Morphine And Related Itching   Morphine Itching    Pt disagrees that this is an allergy.     Family History  Problem Relation Age of Onset   Emphysema Father      Prior to Admission medications   Medication Sig Start Date End Date Taking? Authorizing Provider    albuterol (PROVENTIL HFA;VENTOLIN HFA) 108 (90 Base) MCG/ACT inhaler Inhale 4 puffs into the lungs every 4 (four) hours as needed for wheezing. 11/25/17   [provider]  albuterol (PROVENTIL) (2.5 MG/3ML) 0.083% nebulizer solution Take 2.5 mg by nebulization every 6 (six) hours as needed for wheezing or shortness of breath.  07/28/18   [provider]  ALPRAZolam Duanne Moron) 0.25 MG tablet Take 0.25 mg by mouth at bedtime as needed for sleep. 06/16/18   [provider]  ALPRAZolam Duanne Moron) 0.25 MG tablet Take 0.25 mg by mouth at bedtime. 09/03/18   [provider]  amLODipine (NORVASC) 10 MG tablet Take 10 mg by mouth daily.    [provider]  amLODipine (NORVASC) 10 MG tablet Take 10 mg by mouth daily.    [provider]  aspirin 81 MG tablet Take 81 mg by mouth daily.    [provider]  aspirin EC 81 MG tablet Take 81 mg by mouth daily.    [provider]  atorvastatin (LIPITOR) 40 MG tablet Take 40 mg by mouth daily.    [provider]  buPROPion (WELLBUTRIN SR) 150 MG 12 hr tablet Take 150 mg by mouth 2 (two) times daily. 03/17/18   [provider]  buPROPion (WELLBUTRIN SR) 150 MG 12 hr tablet Take 150 mg by mouth 2 (two) times daily.    [provider]  calcitRIOL (ROCALTROL) 0.25 MCG capsule Take 0.25 mcg by mouth daily. 07/26/18   [provider]  Cholecalciferol (VITAMIN D) 2000 units tablet Take 2,000 Units by mouth daily. 01/07/18   [provider]  clopidogrel (PLAVIX) 75 MG tablet Take 75 mg by mouth daily.    [provider]  clopidogrel (PLAVIX) 75 MG tablet Take 75 mg by mouth daily. 08/16/18   [provider]  Fluticasone-Umeclidin-Vilant (TRELEGY ELLIPTA) 100-62.5-25 MCG/INH AEPB Inhale 1 puff into the lungs daily. 04/13/18   [provider]  Fluticasone-Umeclidin-Vilant (TRELEGY ELLIPTA) 100-62.5-25 MCG/INH AEPB Inhale 1 puff into the lungs daily.      [provider]  hydrALAZINE (APRESOLINE) 100 MG tablet Take 1 tablet (100 mg total) by mouth 3 (three) times daily. 10/01/18 10/31/18  Purohit, Konrad Dolores, MD  hydrALAZINE (APRESOLINE) 50 MG tablet Take 50 mg by mouth 3 (three) times daily.    [provider]  ipratropium-albuterol (DUONEB) 0.5-2.5 (3) MG/3ML SOLN Take 3 mLs by nebulization every 6 (six) hours as needed. Patient taking differently: Take 3 mLs by nebulization every 6 (six) hours as needed (wheezing).  06/10/18   Bonnell Public, MD  isosorbide mononitrate (IMDUR) 30 MG 24 hr tablet Take 30 mg by mouth daily.    [provider]  isosorbide mononitrate (IMDUR) 30 MG 24 hr tablet Take 30 mg by mouth daily.    [provider]  metoprolol succinate (TOPROL-XL) 50 MG 24 hr tablet Take 50 mg by mouth daily. Take  with or immediately following a meal.    [provider]  metoprolol succinate (TOPROL-XL) 50 MG 24 hr tablet Take 50 mg by mouth daily. Take with or immediately following a meal.    [provider]  oxyCODONE-acetaminophen (PERCOCET) 5-325 MG tablet Take 1 tablet by mouth every 6 (six) hours as needed for severe pain. 07/11/18   Rhyne, Hulen Shouts, PA-C  sodium bicarbonate 650 MG tablet Take 1 tablet (650 mg total) by mouth 2 (two) times daily. 06/09/18   Bonnell Public, MD  torsemide (DEMADEX) 20 MG tablet Take 2 tablets (40 mg total) by mouth daily. 06/10/18   Dana Allan I, MD  torsemide (DEMADEX) 20 MG tablet Take 40 mg by mouth daily.    [provider]  VENTOLIN HFA 108 (90 Base) MCG/ACT inhaler Inhale 1-2 puffs into the lungs every 6 (six) hours as needed for wheezing or shortness of breath.  08/31/18   [provider]    Physical Exam: Vitals:   02/24/19 1945 02/24/19 2015 02/24/19 2045 02/24/19 2100  BP: (!) 168/102 (!) 154/109 (!) 177/106 (!) 164/108  Pulse: 93 97 99 97  Resp: (!) 21 16 12 20   Temp:      TempSrc:      SpO2: 93% 93%  92% 94%  Weight:      Height:        Constitutional: NAD, calm  Eyes: PERTLA, lids and conjunctivae normal ENMT: Mucous membranes are moist. Posterior pharynx clear of any exudate or lesions.   Neck: normal, supple, no masses, no thyromegaly Respiratory: no wheezing, no crackles. Normal respiratory effort.   Cardiovascular: S1 & S2 heard, regular rate and rhythm. No extremity edema.   Abdomen: No distension, no tenderness, soft. Bowel sounds active.  Musculoskeletal: central chest tenderness, no crepitus. No joint deformity upper and lower extremities.   Skin: no significant rashes, lesions, ulcers. Warm, dry, well-perfused. Neurologic: CN 2-12 grossly intact. Sensation intact. Strength 5/5 in all 4 limbs.  Psychiatric: Alert and oriented x 3. Calm, cooperative.    Labs on Admission: I have personally reviewed following labs and imaging studies  CBC: Recent Labs  Lab 02/24/19 1654 02/24/19 1702  WBC 7.4  --   NEUTROABS 5.1  --   HGB 9.3* 10.5*  HCT 30.9* 31.0*  MCV 103.7*  --   PLT 232  --    Basic Metabolic Panel: Recent Labs  Lab 02/24/19 1654 02/24/19 1702  NA 136 135  K 3.5 3.4*  CL 88*  --   CO2 23  --   GLUCOSE 165*  --   BUN 7*  --   CREATININE 2.47*  --   CALCIUM 8.4*  --    GFR: Estimated Creatinine Clearance: 18.8 mL/min (A) (by C-G formula based on SCr of 2.47 mg/dL (H)). Liver Function Tests: Recent Labs  Lab 02/24/19 1654  AST 39  ALT 21  ALKPHOS 99  BILITOT 0.4  PROT 7.1  ALBUMIN 3.9   No results for input(s): LIPASE, AMYLASE in the last 168 hours. No results for input(s): AMMONIA in the last 168 hours. Coagulation Profile: No results for input(s): INR, PROTIME in the last 168 hours. Cardiac Enzymes: Recent Labs  Lab 02/24/19 1654  TROPONINI 0.04*   BNP (last 3 results) No results for input(s): PROBNP in the last 8760 hours. HbA1C: No results for input(s): HGBA1C in the last 72 hours. CBG: No results for input(s): GLUCAP in the  last 168 hours. Lipid Profile: No results  for input(s): CHOL, HDL, LDLCALC, TRIG, CHOLHDL, LDLDIRECT in the last 72 hours. Thyroid Function Tests: No results for input(s): TSH, T4TOTAL, FREET4, T3FREE, THYROIDAB in the last 72 hours. Anemia Panel: No results for input(s): VITAMINB12, FOLATE, FERRITIN, TIBC, IRON, RETICCTPCT in the last 72 hours. Urine analysis:    Component Value Date/Time   COLORURINE YELLOW 06/06/2018 2340   APPEARANCEUR CLEAR 06/06/2018 2340   LABSPEC 1.013 06/06/2018 2340   PHURINE 5.0 06/06/2018 2340   GLUCOSEU NEGATIVE 06/06/2018 2340   HGBUR NEGATIVE 06/06/2018 2340   BILIRUBINUR NEGATIVE 06/06/2018 2340   KETONESUR NEGATIVE 06/06/2018 2340   PROTEINUR >=300 (A) 06/06/2018 2340   NITRITE NEGATIVE 06/06/2018 2340   LEUKOCYTESUR NEGATIVE 06/06/2018 2340   Sepsis Labs: @LABRCNTIP (procalcitonin:4,lacticidven:4) )No results found for this or any previous visit (from the past 240 hour(s)).   Radiological Exams on Admission: Ct Head Wo Contrast  Result Date: 02/24/2019 CLINICAL DATA:  Seizure. EXAM: CT HEAD WITHOUT CONTRAST CT CERVICAL SPINE WITHOUT CONTRAST TECHNIQUE: Multidetector CT imaging of the head and cervical spine was performed following the standard protocol without intravenous contrast. Multiplanar CT image reconstructions of the cervical spine were also generated. COMPARISON:  None. FINDINGS: CT HEAD FINDINGS Brain: Right frontal encephalomalacia is noted which extends into right basal ganglia and is consistent with old infarction. No mass effect or midline shift is noted. Ventricular size is within normal limits. There is no evidence of mass lesion, hemorrhage or acute infarction. Vascular: No hyperdense vessel or unexpected calcification. Skull: Normal. Negative for fracture or focal lesion. Sinuses/Orbits: No acute finding. Other: None. CT CERVICAL SPINE FINDINGS Alignment: Normal. Skull base and vertebrae: No acute fracture. No primary bone lesion or  focal pathologic process. Soft tissues and spinal canal: No prevertebral fluid or swelling. No visible canal hematoma. Disc levels: Severe degenerative disc disease is noted at C4-5, C5-6 and C6-7 with anterior posterior osteophyte formation. Upper chest: Negative. Other: None. IMPRESSION: Old right frontal infarction. No acute intracranial abnormality seen. Severe multilevel degenerative disc disease. No acute abnormality seen in the cervical spine. Electronically Signed   By: Marijo Conception, M.D.   On: 02/24/2019 20:34   Ct Cervical Spine Wo Contrast  Result Date: 02/24/2019 CLINICAL DATA:  Seizure. EXAM: CT HEAD WITHOUT CONTRAST CT CERVICAL SPINE WITHOUT CONTRAST TECHNIQUE: Multidetector CT imaging of the head and cervical spine was performed following the standard protocol without intravenous contrast. Multiplanar CT image reconstructions of the cervical spine were also generated. COMPARISON:  None. FINDINGS: CT HEAD FINDINGS Brain: Right frontal encephalomalacia is noted which extends into right basal ganglia and is consistent with old infarction. No mass effect or midline shift is noted. Ventricular size is within normal limits. There is no evidence of mass lesion, hemorrhage or acute infarction. Vascular: No hyperdense vessel or unexpected calcification. Skull: Normal. Negative for fracture or focal lesion. Sinuses/Orbits: No acute finding. Other: None. CT CERVICAL SPINE FINDINGS Alignment: Normal. Skull base and vertebrae: No acute fracture. No primary bone lesion or focal pathologic process. Soft tissues and spinal canal: No prevertebral fluid or swelling. No visible canal hematoma. Disc levels: Severe degenerative disc disease is noted at C4-5, C5-6 and C6-7 with anterior posterior osteophyte formation. Upper chest: Negative. Other: None. IMPRESSION: Old right frontal infarction. No acute intracranial abnormality seen. Severe multilevel degenerative disc disease. No acute abnormality seen in the  cervical spine. Electronically Signed   By: Marijo Conception, M.D.   On: 02/24/2019 20:34   Dg Chest Port 1 View  Result Date:  02/24/2019 CLINICAL DATA:  Post CPR EXAM: PORTABLE CHEST 1 VIEW COMPARISON:  09/30/2018 FINDINGS: Cardiac enlargement without heart failure or edema. Vague airspace disease in the left lung base. Cardiac and mediastinal structures shifted to the left suggesting left lower lobe collapse although pneumonia not excluded. Possible left effusion. Atherosclerotic aortic arch Defibrillator pad overlying the mediastinum. IMPRESSION: Left lower lobe airspace disease with volume loss compatible with atelectasis and possible effusion. Electronically Signed   By: Franchot Gallo M.D.   On: 02/24/2019 17:34    EKG: Independently reviewed. Sinus rhythm, PVC's, LVH with IVCD and repolarization abnormality, ST-depressions inferiorly, QTc 502 ms.   Assessment/Plan   1. Transient loss of consciousness  - Patient reports feeling well after completing HD, but noted to have seizure-like activity while waiting for ride home, no pulse was immediately detected, and she received ~4 minutes of CPR without shocks or meds until EMS arrived and found pulse  - She denies any seizure hx, was not having any chest pain or other symptoms leading up to this event, and only complaint in ED is a reproducible central chest pain  - Unclear what happened or if she actually lost pulse; considerations include cardiac arrest, convulsive syncope, seizure  - EKG with ST-T abnormalities that appear similar to prior and initial troponin 0.04; head CT with no acute findings; neuro exam with no focal deficit  - Plan to continue cardiac monitoring, trend troponin and EKG, check echocardiogram and EEG, continue ASA, statin, and beta-blocker, seizure precautions    2. Ischemic cardiomyopathy   - Appears compensated  - Follow daily wts and continue diuretic and beta-blocker    3. ESRD  - Completed HD on 4/10  - Potassium,  bicarb, and BUN all normal on admission and she appears euvolemic  - SLIV, fluid-restrict diet, renally-dose medications    4. Anxiety  - Continue Seroquel qHS   5. COPD  - No increase in chronic cough, no wheezing on admission  - Stable on her usual 2 Lpm  - Continue ICS/LABA/LAMA, and as-needed albuterol    PPE: Gloves, mask, and face shield  DVT prophylaxis: sq heparin  Code Status: Full  Family Communication: Discussed with patient  Consults called: None Admission status: Observation     Vianne Bulls, MD [facility] Pager [phone]  If 7PM-7AM, please contact night-coverage [email or web address] Password [facility]  02/24/2019, 9:45 PM

## 2019-02-24 NOTE — ED Notes (Addendum)
ED TO INPATIENT HANDOFF REPORT  ED Nurse Name and Phone #:  Gretta Cool [phone]  S Name/Age/Gender [name] 68 y.o. female Room/Bed: 026C/026C  Code Status   Code Status: Prior  Home/SNF/Other Home Patient oriented [id number] Is this baseline? Yes   Triage Complete: Triage complete  Chief Complaint seizure  Triage Note Per [facility], pt from dialysis center w/ a c/o a seizure and possible cardiac arrest. The pt completed dialysis and was waiting at the bus stop when the staff members saw the pt having a seizure. They were unable to find a pulse on her and initiated CPR. They did ~ 4 mins of CPR. No shocks were advised. Pt initially had a GCS of 3 but became more alert with time.   EMS placed bilateral NPAs and provided O2 15 lpm by NRB.   Pt currently has a GCS of 15. CAOx4.    Allergies Allergies  Allergen Reactions  . Citalopram Hydrobromide Hives  . Citalopram Hives  . Morphine And Related Itching  . Morphine Itching    Pt disagrees that this is an allergy.     Level of Care/Admitting Diagnosis ED Disposition    ED Disposition Condition [facility] Area: [place] [[id number]]  Level of Care: Progressive [102]  I expect the patient will be discharged within 24 hours: Yes  LOW acuity---Tx typically complete <24 hrs---ACUTE conditions typically can be evaluated <24 hours---LABS likely to return to acceptable levels <24 hours---IS near functional baseline---EXPECTED to return to current living arrangement---NOT newly hypoxic: Does not meet criteria for 5C-Observation unit  Diagnosis: Transient loss of consciousness [390290]  Admitting Physician: Vianne Bulls [[id number]]  Attending Physician: Vianne Bulls [[id number]]  PT Class (Do Not Modify): Observation [104]  PT Acc Code (Do Not Modify): Observation [10022]       B Medical/Surgery History Past Medical History:  Diagnosis Date  . Acute on chronic heart failure ([place])   . Acute  respiratory failure with hypoxia ([place])   . Alcohol abuse   . Arthralgia   . CAD (coronary artery disease)    moderate primarily septal perforater  . CHF (congestive heart failure) ([place])   . COPD (chronic obstructive pulmonary disease) ([place])   . Depression   . HTN (hypertension)   . Hypercholesterolemia    primarily ldl-p and small particles  . RAS (renal artery stenosis) ([place]) 2002   by cath tysinger  . Renal disorder   . Smoking    Past Surgical History:  Procedure Laterality Date  . abdominal aortogram     perclose of the right femoral artery  . AV FISTULA PLACEMENT Right 07/11/2018   Procedure: Right arm ARTERIOVENOUS FISTULA CREATION;  Surgeon: [name], MD;  Location: [facility];  Service: Vascular;  Laterality: Right;  . AVF placement Right   . CARDIAC CATHETERIZATION     left heart catheterization.  Coronary cineangiography. Lft ventricular cineangiography.    [name] LUNG SURGERY     due to lung cancer per patient's report  . TUBAL LIGATION       A IV Location/Drains/Wounds Patient Lines/Drains/Airways Status   Active Line/Drains/Airways    Name:   Placement date:   Placement time:   Site:   Days:   Peripheral IV 02/24/19 Left Antecubital   02/24/19    1651    Antecubital   less than 1   Peripheral IV 02/24/19 Left Wrist   02/24/19    1700  Wrist   less than 1   Fistula / Graft Right Forearm Arteriovenous fistula   07/11/18    0839    Forearm   228   Fistula / Graft Right Forearm Arteriovenous fistula   02/24/19    -    Forearm   less than 1   Incision (Closed) 07/11/18 Arm Right   07/11/18    0839     228          Intake/Output Last 24 hours  Intake/Output Summary (Last 24 hours) at 02/24/2019 2142 Last data filed at 02/24/2019 2019 Gross per 24 hour  Intake 250 ml  Output -  Net 250 ml    Labs/Imaging Results for orders placed or performed during the hospital encounter of 02/24/19 (from the past 48 hour(s))  Comprehensive metabolic panel     Status:  Abnormal   Collection Time: 02/24/19  4:54 PM  Result Value Ref Range   Sodium 136 135 - 145 mmol/L   Potassium 3.5 3.5 - 5.1 mmol/L   Chloride 88 (L) 98 - 111 mmol/L   CO2 23 22 - 32 mmol/L   Glucose, Bld 165 (H) 70 - 99 mg/dL   BUN 7 (L) 8 - 23 mg/dL   Creatinine, Ser 2.47 (H) 0.44 - 1.00 mg/dL   Calcium 8.4 (L) 8.9 - 10.3 mg/dL   Total Protein 7.1 6.5 - 8.1 g/dL   Albumin 3.9 3.5 - 5.0 g/dL   AST 39 15 - 41 U/L   ALT 21 0 - 44 U/L   Alkaline Phosphatase 99 38 - 126 U/L   Total Bilirubin 0.4 0.3 - 1.2 mg/dL   GFR calc non Af Amer 19 (L) >60 mL/min   GFR calc Af Amer 22 (L) >60 mL/min   Anion gap 25 (H) 5 - 15    Comment: Performed at [facility], [address], [place], [place] [address]  CBC with Differential     Status: Abnormal   Collection Time: 02/24/19  4:54 PM  Result Value Ref Range   WBC 7.4 4.0 - 10.5 K/uL   RBC 2.98 (L) 3.87 - 5.11 MIL/uL   Hemoglobin 9.3 (L) 12.0 - 15.0 g/dL   HCT 30.9 (L) 36.0 - 46.0 %   MCV 103.7 (H) 80.0 - 100.0 fL   MCH 31.2 26.0 - 34.0 pg   MCHC 30.1 30.0 - 36.0 g/dL   RDW 17.2 (H) 11.5 - 15.5 %   Platelets 232 150 - 400 K/uL   nRBC 0.0 0.0 - 0.2 %   Neutrophils Relative % 69 %   Neutro Abs 5.1 1.7 - 7.7 K/uL   Lymphocytes Relative 15 %   Lymphs Abs 1.1 0.7 - 4.0 K/uL   Monocytes Relative 11 %   Monocytes Absolute 0.8 0.1 - 1.0 K/uL   Eosinophils Relative 3 %   Eosinophils Absolute 0.2 0.0 - 0.5 K/uL   Basophils Relative 1 %   Basophils Absolute 0.0 0.0 - 0.1 K/uL   Immature Granulocytes 1 %   Abs Immature Granulocytes 0.08 (H) 0.00 - 0.07 K/uL    Comment: Performed at [facility] [address] [place], [place] [address]  Troponin I - ONCE - STAT     Status: Abnormal   Collection Time: 02/24/19  4:54 PM  Result Value Ref Range   Troponin I 0.04 (HH) <0.03 ng/mL    Comment: CRITICAL RESULT CALLED TO, READ BACK BY AND VERIFIED WITH: Mali BROWN,RN 1809 02/24/2019  WBOND Mali GROSE,RN Performed at [facility], [place] [address], [place], [place] [address] CORRECTED ON 04/10 AT 1832: PREVIOUSLY REPORTED AS 0.04 CRITICAL RESULT CALLED TO, READ BACK BY AND VERIFIED WITH: Mali BROWN,RN 1809 02/24/2019 WBOND   Ethanol     Status: None   Collection Time: 02/24/19  4:55 PM  Result Value Ref Range   Alcohol, Ethyl (B) <10 <10 mg/dL    Comment: (NOTE) Lowest detectable limit for serum alcohol is 10 mg/dL. For medical purposes only. Performed at [facility], [place] [address], [place], [place] [address]   POCT I-Stat EG7     Status: Abnormal   Collection Time: 02/24/19  5:02 PM  Result Value Ref Range   pH, Ven 7.422 7.250 - 7.430   pCO2, Ven 38.6 (L) 44.0 - 60.0 mmHg   pO2, Ven 49.0 (H) 32.0 - 45.0 mmHg   Bicarbonate 25.1 20.0 - 28.0 mmol/L   TCO2 26 22 - 32 mmol/L   O2 Saturation 85.0 %   Acid-Base Excess 1.0 0.0 - 2.0 mmol/L   Sodium 135 135 - 145 mmol/L   Potassium 3.4 (L) 3.5 - 5.1 mmol/L   Calcium, Ion 0.92 (L) 1.15 - 1.40 mmol/L   HCT 31.0 (L) 36.0 - 46.0 %   Hemoglobin 10.5 (L) 12.0 - 15.0 g/dL   Patient temperature HIDE    Sample type VENOUS    Ct Head Wo Contrast  Result Date: 02/24/2019 CLINICAL DATA:  Seizure. EXAM: CT HEAD WITHOUT CONTRAST CT CERVICAL SPINE WITHOUT CONTRAST TECHNIQUE: Multidetector CT imaging of the head and cervical spine was performed following the standard protocol without intravenous contrast. Multiplanar CT image reconstructions of the cervical spine were also generated. COMPARISON:  None. FINDINGS: CT HEAD FINDINGS Brain: Right frontal encephalomalacia is noted which extends into right basal ganglia and is consistent with old infarction. No mass effect or midline shift is noted. Ventricular size is within normal limits. There is no evidence of mass lesion, hemorrhage or acute infarction. Vascular: No hyperdense vessel or unexpected calcification. Skull: Normal. Negative for fracture or focal lesion. Sinuses/Orbits: No acute finding. Other: None. CT  CERVICAL SPINE FINDINGS Alignment: Normal. Skull base and vertebrae: No acute fracture. No primary bone lesion or focal pathologic process. Soft tissues and spinal canal: No prevertebral fluid or swelling. No visible canal hematoma. Disc levels: Severe degenerative disc disease is noted at C4-5, C5-6 and C6-7 with anterior posterior osteophyte formation. Upper chest: Negative. Other: None. IMPRESSION: Old right frontal infarction. No acute intracranial abnormality seen. Severe multilevel degenerative disc disease. No acute abnormality seen in the cervical spine. Electronically Signed   By: Marijo Conception, M.D.   On: 02/24/2019 20:34   Ct Cervical Spine Wo Contrast  Result Date: 02/24/2019 CLINICAL DATA:  Seizure. EXAM: CT HEAD WITHOUT CONTRAST CT CERVICAL SPINE WITHOUT CONTRAST TECHNIQUE: Multidetector CT imaging of the head and cervical spine was performed following the standard protocol without intravenous contrast. Multiplanar CT image reconstructions of the cervical spine were also generated. COMPARISON:  None. FINDINGS: CT HEAD FINDINGS Brain: Right frontal encephalomalacia is noted which extends into right basal ganglia and is consistent with old infarction. No mass effect or midline shift is noted. Ventricular size is within normal limits. There is no evidence of mass lesion, hemorrhage or acute infarction. Vascular: No hyperdense vessel or unexpected calcification. Skull: Normal. Negative for fracture or focal lesion. Sinuses/Orbits: No acute finding. Other: None. CT CERVICAL SPINE FINDINGS Alignment: Normal. Skull base and vertebrae:  No acute fracture. No primary bone lesion or focal pathologic process. Soft tissues and spinal canal: No prevertebral fluid or swelling. No visible canal hematoma. Disc levels: Severe degenerative disc disease is noted at C4-5, C5-6 and C6-7 with anterior posterior osteophyte formation. Upper chest: Negative. Other: None. IMPRESSION: Old right frontal infarction. No acute  intracranial abnormality seen. Severe multilevel degenerative disc disease. No acute abnormality seen in the cervical spine. Electronically Signed   By: Marijo Conception, M.D.   On: 02/24/2019 20:34   Dg Chest Port 1 View  Result Date: 02/24/2019 CLINICAL DATA:  Post CPR EXAM: PORTABLE CHEST 1 VIEW COMPARISON:  09/30/2018 FINDINGS: Cardiac enlargement without heart failure or edema. Vague airspace disease in the left lung base. Cardiac and mediastinal structures shifted to the left suggesting left lower lobe collapse although pneumonia not excluded. Possible left effusion. Atherosclerotic aortic arch Defibrillator pad overlying the mediastinum. IMPRESSION: Left lower lobe airspace disease with volume loss compatible with atelectasis and possible effusion. Electronically Signed   By: Franchot Gallo M.D.   On: 02/24/2019 17:34    Pending Labs Unresulted Labs (From admission, onward)    Start     Ordered   02/24/19 1655  Urinalysis, Routine w reflex microscopic  ONCE - STAT,   STAT     02/24/19 1655   02/24/19 1655  Urine rapid drug screen (hosp performed)  ONCE - STAT,   STAT     02/24/19 1655          Vitals/Pain Today's Vitals   02/24/19 2020 02/24/19 2045 02/24/19 2100 02/24/19 2135  BP:  (!) 177/106 (!) 164/108   Pulse:  99 97   Resp:  12 20   Temp:      TempSrc:      SpO2:  92% 94%   Weight:      Height:      PainSc: 7    7     Isolation Precautions No active isolations  Medications Medications  sodium chloride 0.9 % bolus 250 mL (0 mLs Intravenous Stopped 02/24/19 2019)  fentaNYL (SUBLIMAZE) injection 50 mcg (50 mcg Intravenous Given 02/24/19 1945)    Mobility walks High fall risk   Focused Assessments Cardiac Assessment Handoff:  Cardiac Rhythm: Sinus tachycardia Lab Results  Component Value Date   TROPONINI 0.04 ([place]) 02/24/2019   No results found for: DDIMER Does the Patient currently have chest pain? Yes     R Recommendations: See Admitting Provider  Note  Report given to:   Additional Notes: Chest pain is from the compressions that was preformed on her at dialysis center.  Patient is in NSR, ventricular bi and trigeminy.

## 2019-02-25 ENCOUNTER — Observation Stay (HOSPITAL_COMMUNITY): Payer: Medicare Other

## 2019-02-25 ENCOUNTER — Observation Stay (HOSPITAL_BASED_OUTPATIENT_CLINIC_OR_DEPARTMENT_OTHER): Payer: Medicare Other

## 2019-02-25 DIAGNOSIS — R55 Syncope and collapse: Secondary | ICD-10-CM

## 2019-02-25 DIAGNOSIS — F1010 Alcohol abuse, uncomplicated: Secondary | ICD-10-CM | POA: Diagnosis not present

## 2019-02-25 DIAGNOSIS — I2510 Atherosclerotic heart disease of native coronary artery without angina pectoris: Secondary | ICD-10-CM | POA: Diagnosis not present

## 2019-02-25 DIAGNOSIS — F17210 Nicotine dependence, cigarettes, uncomplicated: Secondary | ICD-10-CM | POA: Diagnosis not present

## 2019-02-25 LAB — URINALYSIS, ROUTINE W REFLEX MICROSCOPIC
Bacteria, UA: NONE SEEN
Bilirubin Urine: NEGATIVE
Glucose, UA: NEGATIVE mg/dL
Ketones, ur: NEGATIVE mg/dL
Leukocytes,Ua: NEGATIVE
Nitrite: NEGATIVE
Protein, ur: 100 mg/dL — AB
Specific Gravity, Urine: 1.004 — ABNORMAL LOW (ref 1.005–1.030)
pH: 9 — ABNORMAL HIGH (ref 5.0–8.0)

## 2019-02-25 LAB — BASIC METABOLIC PANEL
Anion gap: 17 — ABNORMAL HIGH (ref 5–15)
BUN: 13 mg/dL (ref 8–23)
CO2: 27 mmol/L (ref 22–32)
Calcium: 9.1 mg/dL (ref 8.9–10.3)
Chloride: 93 mmol/L — ABNORMAL LOW (ref 98–111)
Creatinine, Ser: 3.31 mg/dL — ABNORMAL HIGH (ref 0.44–1.00)
GFR calc Af Amer: 16 mL/min — ABNORMAL LOW (ref >60)
GFR calc non Af Amer: 14 mL/min — ABNORMAL LOW (ref >60)
Glucose, Bld: 97 mg/dL (ref 70–99)
Potassium: 3.1 mmol/L — ABNORMAL LOW (ref 3.5–5.1)
Sodium: 137 mmol/L (ref 135–145)

## 2019-02-25 LAB — RAPID URINE DRUG SCREEN, HOSP PERFORMED
Amphetamines: NOT DETECTED
Barbiturates: NOT DETECTED
Benzodiazepines: NOT DETECTED
Cocaine: NOT DETECTED
Opiates: NOT DETECTED
Tetrahydrocannabinol: NOT DETECTED

## 2019-02-25 LAB — CBC
HCT: 28.7 % — ABNORMAL LOW (ref 36.0–46.0)
Hemoglobin: 8.8 g/dL — ABNORMAL LOW (ref 12.0–15.0)
MCH: 30.1 pg (ref 26.0–34.0)
MCHC: 30.7 g/dL (ref 30.0–36.0)
MCV: 98.3 fL (ref 80.0–100.0)
Platelets: 192 10*3/uL (ref 150–400)
RBC: 2.92 MIL/uL — ABNORMAL LOW (ref 3.87–5.11)
RDW: 17 % — ABNORMAL HIGH (ref 11.5–15.5)
WBC: 6.2 10*3/uL (ref 4.0–10.5)
nRBC: 0 % (ref 0.0–0.2)

## 2019-02-25 LAB — MAGNESIUM: Magnesium: 2.4 mg/dL (ref 1.7–2.4)

## 2019-02-25 LAB — ECHOCARDIOGRAM COMPLETE
Height: 64 in
Weight: 2081.14 oz

## 2019-02-25 LAB — MRSA PCR SCREENING: MRSA by PCR: NEGATIVE

## 2019-02-25 LAB — TROPONIN I
Troponin I: 0.07 ng/mL (ref <0.03)
Troponin I: 0.08 ng/mL (ref <0.03)

## 2019-02-25 MED ORDER — POTASSIUM CHLORIDE CRYS ER 20 MEQ PO TBCR
40.0000 meq | EXTENDED_RELEASE_TABLET | Freq: Once | ORAL | Status: AC
  Administered 2019-02-25: 40 meq via ORAL
  Filled 2019-02-25: qty 2

## 2019-02-25 MED ORDER — HYDROCODONE-ACETAMINOPHEN 5-325 MG PO TABS
1.0000 | ORAL_TABLET | ORAL | Status: DC | PRN
  Administered 2019-02-25 – 2019-02-26 (×4): 1 via ORAL
  Filled 2019-02-25 (×4): qty 1

## 2019-02-25 MED ORDER — TRAZODONE HCL 50 MG PO TABS
50.0000 mg | ORAL_TABLET | Freq: Once | ORAL | Status: AC
  Administered 2019-02-25: 21:00:00 50 mg via ORAL
  Filled 2019-02-25: qty 1

## 2019-02-25 NOTE — Progress Notes (Signed)
PT Cancellation Note  Patient Details Name: Kari Robinson MRN: [id number] DOB: January 05, 1951   Cancelled Treatment:    Reason Eval/Treat Not Completed: Patient at procedure or test/unavailable (MRI).  Kari Robinson, PT, DPT Acute Rehabilitation Services Pager [phone] Office [phone]    Kari Robinson 02/25/2019, 3:39 PM

## 2019-02-25 NOTE — Progress Notes (Signed)
EEG completed; results pending.    

## 2019-02-25 NOTE — Procedures (Signed)
ELECTROENCEPHALOGRAM REPORT   Patient: Kari Robinson       Room #: 4E31V EEG No. ID: [id number] Age: 68 y.o.        Sex: female Referring Physician: Kc Report Date:  02/25/2019        Interpreting Physician: Alexis Goodell  History: Kari Robinson is an 68 y.o. female presenting s/p an episode of unresponsiveness  Medications:  ASA, Lipitor, Plavix, Breo Ellipta, Apresoline, Imdur, Toprol, Seroquel, Demadex, Incruse  Conditions of Recording:  This is a 21 channel routine scalp EEG performed with bipolar and monopolar montages arranged in accordance to the international 10/20 system of electrode placement. One channel was dedicated to EKG recording.  The patient is in the awake state.  Description:  The waking background activity consists of a low voltage, symmetrical, fairly well organized, 9-10 Hz alpha activity, seen from the parieto-occipital and posterior temporal regions.  Low voltage fast activity, poorly organized, is seen anteriorly and is at times superimposed on more posterior regions.  A mixture of theta and alpha rhythms are seen from the central and temporal regions. The patient does not drowse or sleep. No epileptiform activity is noted.   Hyperventilation and intermittent photic stimulation were not performed.  IMPRESSION: This is a normal awake electroencephalogram. There are no focal lateralizing or epileptiform features.   Alexis Goodell, MD Neurology [phone] 02/25/2019, 11:12 AM

## 2019-02-25 NOTE — Consult Note (Signed)
Cardiology Consultation:   Patient ID: Kari Robinson MRN: [id number]; DOB: 07/04/1951  Admit date: 02/24/2019 Date of Consult: 02/25/2019  Primary Care Provider: Patient, [name] Primary Cardiologist: Kari Robinson Primary Electrophysiologist:  None    Patient Profile:   Kari Robinson is a 68 y.o. female with a hx of CAD and ESRD who is being seen today for the evaluation of loss of consciouness at the request of Kari Robinson.  History of Present Illness:   Kari Robinson with a hx of CAD and ESRD who is being seen today for the evaluation of loss of consciouness at the request of Kari Robinson.   Followed at [facility] from cardiac standpoint. Notes indicate history of CAD with prior MI receiving stents in [place] in 2005 and 2016. Notes report she received 3 stents in 2005 and her NSTEMI in 2016 was medically managed. Of note LVEF 25-30% by echo in 09/2017 however by repeat study 04/2018 had normalized. 05/2018 echo at [facility] LVEF 40-45%   Also with history of ESRD, COPD on home O2   Presented after LOC shorlty after HD session. Was waiting on bench by report for her ride home when reportedly had seizure like activity. From notes dialysis personal came to assist, could not find a pulse and began CPR x 4 minutes. EMS found a femoral pulse on arrival.    K 3.5, Cr 2.47 WBC 7.4 Hgb 9.3 Plt 232  Trop 0.04-->0.06-->0.07-->0.08 CXR LLL airspace disease, atelectasis vs effusion CT head no acute process. Old right frontal infarction Echo LVEF 40-45%, normal RV EKG SR, inferior/lateral ST depressions chronic, occasional PVCs    04/2018 [facility] Echo LVEF 55-60%, no significant valve pathology Past Medical History:  Diagnosis Date   Acute on chronic heart failure ([name])    Acute respiratory failure with hypoxia ([name])    Alcohol abuse    Arthralgia    CAD (coronary artery disease)    moderate primarily septal perforater   CHF (congestive heart failure) ([name])    COPD (chronic obstructive  pulmonary disease) ([name])    Depression    HTN (hypertension)    Hypercholesterolemia    primarily ldl-p and small particles   RAS (renal artery stenosis) ([place]) 2002   by cath tysinger   Renal disorder    Smoking     Past Surgical History:  Procedure Laterality Date   abdominal aortogram     perclose of the right femoral artery   AV FISTULA PLACEMENT Right 07/11/2018   Procedure: Right arm ARTERIOVENOUS FISTULA CREATION;  Surgeon: Kari Dutch, MD;  Location: [facility] OR;  Service: Vascular;  Laterality: Right;   AVF placement Right    CARDIAC CATHETERIZATION     left heart catheterization.  Coronary cineangiography. Lft ventricular cineangiography.     LUNG SURGERY     due to lung cancer per patient's report   TUBAL LIGATION       Inpatient Medications: Scheduled Meds:  aspirin EC  81 mg Oral Daily   atorvastatin  40 mg Oral q1800   clopidogrel  75 mg Oral Daily   fluticasone furoate-vilanterol  1 puff Inhalation Daily   And   umeclidinium bromide  1 puff Inhalation Daily   heparin  5,000 Units Subcutaneous Q8H   hydrALAZINE  100 mg Oral Q8H   isosorbide mononitrate  30 mg Oral Daily   torsemide  40 mg Oral Daily   Continuous Infusions:  PRN Meds: acetaminophen, albuterol, HYDROcodone-acetaminophen, nitroGLYCERIN, ondansetron (ZOFRAN) IV  Allergies:  Allergies  Allergen Reactions   Citalopram Hydrobromide Hives   Morphine And Related Itching   Morphine Itching    Pt disagrees that this is an allergy.     Social History:   Social History   Socioeconomic History   Marital status: Single    Spouse name: Not on file   Number of children: Not on file   Years of education: Not on file   Highest education level: Not on file  Occupational History   Not on file  Social Needs   Financial resource strain: Not on file   Food insecurity:    Worry: Not on file    Inability: Not on file   Transportation needs:    Medical: Not on  file    Non-medical: Not on file  Tobacco Use   Smoking status: Current Every Day Smoker    Years: 35.00    Types: Cigarettes   Smokeless tobacco: Never Used   Tobacco comment: 3-4 cigarettes per day  Substance and Sexual Activity   Alcohol use: Never    Frequency: Never    Comment: states has quit drinking "a few months ago"   Drug use: Never   Sexual activity: Not on file  Lifestyle   Physical activity:    Days per week: Not on file    Minutes per session: Not on file   Stress: Not on file  Relationships   Social connections:    Talks on phone: Not on file    Gets together: Not on file    Attends religious service: Not on file    Active member of club or organization: Not on file    Attends meetings of clubs or organizations: Not on file    Relationship status: Not on file   Intimate partner violence:    Fear of current or ex partner: Not on file    Emotionally abused: Not on file    Physically abused: Not on file    Forced sexual activity: Not on file  Other Topics Concern   Not on file  Social History Narrative   ** Merged History Encounter **        Family History:   Family History  Problem Relation Age of Onset   Emphysema Father      ROS:  Please see the history of present illness.  All other ROS reviewed and negative.     Physical Exam/Data:   Vitals:   02/25/19 0308 02/25/19 0819 02/25/19 0841 02/25/19 1156  BP: 129/84 (!) 144/91  98/71  Pulse: (!) 43 99  84  Resp: 11 20    Temp: 98.9 F (37.2 C)   97.9 F (36.6 C)  TempSrc: Oral   Oral  SpO2: 96% 92% 93% 96%  Weight:      Height:        Intake/Output Summary (Last 24 hours) at 02/25/2019 1237 Last data filed at 02/24/2019 2019 Gross per 24 hour  Intake 250 ml  Output --  Net 250 ml   Last 3 Weights 02/24/2019 02/24/2019 08/25/2018  Weight (lbs) 130 lb 1.1 oz 130 lb 140 lb  Weight (kg) 59 kg 58.968 kg 63.504 kg  Some encounter information is confidential and restricted. Go to  Review Flowsheets activity to see all data.     Body mass index is 22.33 kg/m.    Physical exam deferred After extensive review of chart and already obtained data it is my medical opinion that physical exam is low yield and  is an unneccesary patient-physician contact given COVID-19 risks.   Laboratory Data:  Chemistry Recent Labs  Lab 02/24/19 1654 02/24/19 1702 02/25/19 0314  NA 136 135 137  K 3.5 3.4* 3.1*  CL 88*  --  93*  CO2 23  --  27  GLUCOSE 165*  --  97  BUN 7*  --  13  CREATININE 2.47*  --  3.31*  CALCIUM 8.4*  --  9.1  GFRNONAA 19*  --  14*  GFRAA 22*  --  16*  ANIONGAP 25*  --  17*    Recent Labs  Lab 02/24/19 1654  PROT 7.1  ALBUMIN 3.9  AST 39  ALT 21  ALKPHOS 99  BILITOT 0.4   Hematology Recent Labs  Lab 02/24/19 1654 02/24/19 1702 02/25/19 0314  WBC 7.4  --  6.2  RBC 2.98*  --  2.92*  HGB 9.3* 10.5* 8.8*  HCT 30.9* 31.0* 28.7*  MCV 103.7*  --  98.3  MCH 31.2  --  30.1  MCHC 30.1  --  30.7  RDW 17.2*  --  17.0*  PLT 232  --  192   Cardiac Enzymes Recent Labs  Lab 02/24/19 1654 02/24/19 2211 02/25/19 0314 02/25/19 0942  TROPONINI 0.04* 0.06* 0.07* 0.08*   No results for input(s): TROPIPOC in the last 168 hours.  BNPNo results for input(s): BNP, PROBNP in the last 168 hours.  DDimer No results for input(s): DDIMER in the last 168 hours.  Radiology/Studies:  Ct Head Wo Contrast  Result Date: 02/24/2019 CLINICAL DATA:  Seizure. EXAM: CT HEAD WITHOUT CONTRAST CT CERVICAL SPINE WITHOUT CONTRAST TECHNIQUE: Multidetector CT imaging of the head and cervical spine was performed following the standard protocol without intravenous contrast. Multiplanar CT image reconstructions of the cervical spine were also generated. COMPARISON:  None. FINDINGS: CT HEAD FINDINGS Brain: Right frontal encephalomalacia is noted which extends into right basal ganglia and is consistent with old infarction. No mass effect or midline shift is noted. Ventricular  size is within normal limits. There is no evidence of mass lesion, hemorrhage or acute infarction. Vascular: No hyperdense vessel or unexpected calcification. Skull: Normal. Negative for fracture or focal lesion. Sinuses/Orbits: No acute finding. Other: None. CT CERVICAL SPINE FINDINGS Alignment: Normal. Skull base and vertebrae: No acute fracture. No primary bone lesion or focal pathologic process. Soft tissues and spinal canal: No prevertebral fluid or swelling. No visible canal hematoma. Disc levels: Severe degenerative disc disease is noted at C4-5, C5-6 and C6-7 with anterior posterior osteophyte formation. Upper chest: Negative. Other: None. IMPRESSION: Old right frontal infarction. No acute intracranial abnormality seen. Severe multilevel degenerative disc disease. No acute abnormality seen in the cervical spine. Electronically Signed   By: Marijo Conception, M.D.   On: 02/24/2019 20:34   Ct Cervical Spine Wo Contrast  Result Date: 02/24/2019 CLINICAL DATA:  Seizure. EXAM: CT HEAD WITHOUT CONTRAST CT CERVICAL SPINE WITHOUT CONTRAST TECHNIQUE: Multidetector CT imaging of the head and cervical spine was performed following the standard protocol without intravenous contrast. Multiplanar CT image reconstructions of the cervical spine were also generated. COMPARISON:  None. FINDINGS: CT HEAD FINDINGS Brain: Right frontal encephalomalacia is noted which extends into right basal ganglia and is consistent with old infarction. No mass effect or midline shift is noted. Ventricular size is within normal limits. There is no evidence of mass lesion, hemorrhage or acute infarction. Vascular: No hyperdense vessel or unexpected calcification. Skull: Normal. Negative for fracture or focal lesion. Sinuses/Orbits: No acute  finding. Other: None. CT CERVICAL SPINE FINDINGS Alignment: Normal. Skull base and vertebrae: No acute fracture. No primary bone lesion or focal pathologic process. Soft tissues and spinal canal: No  prevertebral fluid or swelling. No visible canal hematoma. Disc levels: Severe degenerative disc disease is noted at C4-5, C5-6 and C6-7 with anterior posterior osteophyte formation. Upper chest: Negative. Other: None. IMPRESSION: Old right frontal infarction. No acute intracranial abnormality seen. Severe multilevel degenerative disc disease. No acute abnormality seen in the cervical spine. Electronically Signed   By: Marijo Conception, M.D.   On: 02/24/2019 20:34   Dg Chest Port 1 View  Result Date: 02/24/2019 CLINICAL DATA:  Post CPR EXAM: PORTABLE CHEST 1 VIEW COMPARISON:  09/30/2018 FINDINGS: Cardiac enlargement without heart failure or edema. Vague airspace disease in the left lung base. Cardiac and mediastinal structures shifted to the left suggesting left lower lobe collapse although pneumonia not excluded. Possible left effusion. Atherosclerotic aortic arch Defibrillator pad overlying the mediastinum. IMPRESSION: Left lower lobe airspace disease with volume loss compatible with atelectasis and possible effusion. Electronically Signed   By: Franchot Gallo M.D.   On: 02/24/2019 17:34    Assessment and Plan:   1. Loss of consciouness - unclear etiology. Unlikely true cardiac arrest, if truly lost her pulse for 4 minutes would expect much higher troponin level. - ongoing workup by neuro for reported seizure like activity - echo is stable - unclear if arrhythmia played any role. Long QTc discussed below.   - no significant bradycardia by telemetry review, I suspect the low dynamap rates at times are due to undetected PVCs she is having.  - no significant arrhythmias by tele review - follow further neuro workup, continue tele monitoring. May require a monitor at discharge.     2. Prolonged QTc - manual measure 500 ms - K 3.1, Mg pending. Agree with holding seroquel and monitoring.  - tele shows PVCs, no significnat runs of NSVT or VT.    3. CAD - mild flat troponin not consistent with  ACS. Syncope not consistent with ACS, no specific cardiopulmonary symptoms - no further workup.   We will follow this patient remotely with chart review and telemetry review tomorrow, work to limit patient-physician contact in setting of COVID-19 risks. Physical exam relatively low yield and an unneccesary patient/physician contact,  in this setting it has been deferred as reported above.    For questions or updates, please contact Lucas Please consult [email or web address] for contact info under     Signed, Carlyle Dolly, MD  02/25/2019 12:37 PM

## 2019-02-25 NOTE — Progress Notes (Signed)
PROGRESS NOTE    Kari Robinson  CWC:[id number] DOB: October 10, 1951 DOA: 02/24/2019 PCP: Patient, [name]   Brief Narrative: 68 year old female with significant history of ESRD on dialysis, hypertension, ischemic cardiomyopathy, COPD with 2 L oxygen depression/anxiety brought to the ER for evaluation of seizure-like activity.  Patient had completed her dialysis session and was waiting on abeyance for a ride back home when she was noted to have seizure-like activity, dialysis pressure neurologist apparently unable to find a pulse and began CPR reportedly for about 40 minutes, upon EMS arrival found femoral pulse, GCS was 3 initially and patient became fully oriented during the transfer to the ER.  Complained of chest pain following CPR.  In the ER afebrile, saturating well on 2 L/min of supplemental oxygen, and hypertensive to 180/100.  EKG features a sinus rhythm with PVCs, LVH, repolarization abnormality, inferior ST depressions, and QTc interval of 502 ms.  Chemistry panel is notable for anion gap of 25.  CBC features a stable anemia with hemoglobin 9.3.  Troponin is slightly elevated at 0.04.  Patient was given 50 mcg of fentanyl and 250 cc normal saline in the ED.  Remains hemodynamically stable, in no apparent respiratory distress, and will be observed for ongoing evaluation and management.  Subjective: Complains of pain on the chest at the site of CPR, otherwise no nausea vomiting fever chills.   " I am not ready to go"  Assessment & Plan:   Transient loss of consciousness: Reported seizure-like activity, patient was given CPR is apparently not able to find a pulse however upon EMS arrival she did have femoral pulse.  Unclear etiology, likely seizure vs syncope. CT showed" Old right frontal infarction. No acute intracranial abnormality seen", given her prior, postdialysis it could lower seizure threshold.I discussed with neurologist Dr. Londell Moh, who recommends MRI brain to evaluate but does not  recommend antiseizure medication for now., She has been hemodynamically stable, no obvious evidence of ACS, troponin 0 0.06, 0.070.08 flat, in the setting of ESRD, CPR. EKG shows prolonged QTC at 561- avoid qt prolonging meds- hold seroquel, will correct electrolytes, give kclm check mag. HR SB in 30-50s during night- hold metoprolol. Ask cardio inputs.  Obtaining echocardiogram.  EEG is unremarkable. UDS negative. Obtain PT OT.  Significant pain at the site of CPR add on oral Norco for pain control.  Ischemic cardiomyopathy. + troponin.  Echocardiogram pending.  Continue on her aspirin/Plavix/Lipitor/Imdur/metoprolol .  COPD, chronic hypoxic respiratory failure, stable, on home oxygen 2 L, continue ICS/LABA/L AMA and as needed bronchodilators.   Depression with anxiety: stable. Hold home Seroquel.   ESRD: Last HD Friday. Bmp stable.  DVT prophylaxis: SQ Heparin Code Status: FULL Family Communication:patient Disposition Plan: on hold, pending further workup.  Consultants:  Neurology Procedures:  Antimicrobials: Anti-infectives (From admission, onward)   None       Objective: Vitals:   02/25/19 0108 02/25/19 0308 02/25/19 0819 02/25/19 0841  BP: 130/72 129/84 (!) 144/91   Pulse: (!) 53 (!) 43 99   Resp: (!) 21 11 20    Temp:  98.9 F (37.2 C)    TempSrc:  Oral    SpO2: 94% 96% 92% 93%  Weight:      Height:        Intake/Output Summary (Last 24 hours) at 02/25/2019 1142 Last data filed at 02/24/2019 2019 Gross per 24 hour  Intake 250 ml  Output --  Net 250 ml   Filed Weights   02/24/19 1654 02/24/19 2300  Weight: 59 kg 59 kg   Weight change:   Body mass index is 22.33 kg/m.  Intake/Output from previous day: 04/10 0701 - 04/11 0700 In: 250 [I.V.:250] Out: -  Intake/Output this shift: No intake/output data recorded.  Examination:  General exam: Appears calm and comfortable,Not in distress, older fore the age HEENT:PERRL,Oral mucosa moist, Ear/Nose normal on  gross exam Respiratory system: Tender chest centrally ant, bilateral equal air entry, normal vesicular breath sounds, no wheezes or crackles  Cardiovascular system: S1 & S2 heard,No JVD, murmurs. Gastrointestinal system: Abdomen is  soft, non tender, non distended, BS +  Nervous System:Alert and oriented. No focal neurological deficits/moving extremities, sensation intact. Extremities: No edema, no clubbing, distal peripheral pulses palpable. Skin: No rashes, lesions, no icterus MSK: Normal muscle bulk,tone ,power  Medications:  Scheduled Meds:  aspirin EC  81 mg Oral Daily   atorvastatin  40 mg Oral q1800   clopidogrel  75 mg Oral Daily   fluticasone furoate-vilanterol  1 puff Inhalation Daily   And   umeclidinium bromide  1 puff Inhalation Daily   heparin  5,000 Units Subcutaneous Q8H   hydrALAZINE  100 mg Oral Q8H   isosorbide mononitrate  30 mg Oral Daily   metoprolol succinate  50 mg Oral Daily   QUEtiapine  50 mg Oral QHS   torsemide  40 mg Oral Daily   Continuous Infusions:  Data Reviewed: I have personally reviewed following labs and imaging studies  CBC: Recent Labs  Lab 02/24/19 1654 02/24/19 1702 02/25/19 0314  WBC 7.4  --  6.2  NEUTROABS 5.1  --   --   HGB 9.3* 10.5* 8.8*  HCT 30.9* 31.0* 28.7*  MCV 103.7*  --  98.3  PLT 232  --  542   Basic Metabolic Panel: Recent Labs  Lab 02/24/19 1654 02/24/19 1702 02/25/19 0314  NA 136 135 137  K 3.5 3.4* 3.1*  CL 88*  --  93*  CO2 23  --  27  GLUCOSE 165*  --  97  BUN 7*  --  13  CREATININE 2.47*  --  3.31*  CALCIUM 8.4*  --  9.1   GFR: Estimated Creatinine Clearance: 14 mL/min (A) (by C-G formula based on SCr of 3.31 mg/dL (H)). Liver Function Tests: Recent Labs  Lab 02/24/19 1654  AST 39  ALT 21  ALKPHOS 99  BILITOT 0.4  PROT 7.1  ALBUMIN 3.9   No results for input(s): LIPASE, AMYLASE in the last 168 hours. No results for input(s): AMMONIA in the last 168 hours. Coagulation  Profile: No results for input(s): INR, PROTIME in the last 168 hours. Cardiac Enzymes: Recent Labs  Lab 02/24/19 1654 02/24/19 2211 02/25/19 0314 02/25/19 0942  TROPONINI 0.04* 0.06* 0.07* 0.08*   BNP (last 3 results) No results for input(s): PROBNP in the last 8760 hours. HbA1C: No results for input(s): HGBA1C in the last 72 hours. CBG: No results for input(s): GLUCAP in the last 168 hours. Lipid Profile: No results for input(s): CHOL, HDL, LDLCALC, TRIG, CHOLHDL, LDLDIRECT in the last 72 hours. Thyroid Function Tests: No results for input(s): TSH, T4TOTAL, FREET4, T3FREE, THYROIDAB in the last 72 hours. Anemia Panel: No results for input(s): VITAMINB12, FOLATE, FERRITIN, TIBC, IRON, RETICCTPCT in the last 72 hours. Sepsis Labs: No results for input(s): PROCALCITON, LATICACIDVEN in the last 168 hours.  Recent Results (from the past 240 hour(s))  MRSA PCR Screening     Status: None   Collection Time: 02/24/19 11:27 PM  Result Value Ref Range Status   MRSA by PCR NEGATIVE NEGATIVE Final    Comment:        The GeneXpert MRSA Assay (FDA approved for NASAL specimens only), is one component of a comprehensive MRSA colonization surveillance program. It is not intended to diagnose MRSA infection nor to guide or monitor treatment for MRSA infections. Performed at [facility], [place] [address], [place], [place] [address]       Radiology Studies: Ct Head Wo Contrast  Result Date: 02/24/2019 CLINICAL DATA:  Seizure. EXAM: CT HEAD WITHOUT CONTRAST CT CERVICAL SPINE WITHOUT CONTRAST TECHNIQUE: Multidetector CT imaging of the head and cervical spine was performed following the standard protocol without intravenous contrast. Multiplanar CT image reconstructions of the cervical spine were also generated. COMPARISON:  None. FINDINGS: CT HEAD FINDINGS Brain: Right frontal encephalomalacia is noted which extends into right basal ganglia and is consistent with old infarction. No  mass effect or midline shift is noted. Ventricular size is within normal limits. There is no evidence of mass lesion, hemorrhage or acute infarction. Vascular: No hyperdense vessel or unexpected calcification. Skull: Normal. Negative for fracture or focal lesion. Sinuses/Orbits: No acute finding. Other: None. CT CERVICAL SPINE FINDINGS Alignment: Normal. Skull base and vertebrae: No acute fracture. No primary bone lesion or focal pathologic process. Soft tissues and spinal canal: No prevertebral fluid or swelling. No visible canal hematoma. Disc levels: Severe degenerative disc disease is noted at C4-5, C5-6 and C6-7 with anterior posterior osteophyte formation. Upper chest: Negative. Other: None. IMPRESSION: Old right frontal infarction. No acute intracranial abnormality seen. Severe multilevel degenerative disc disease. No acute abnormality seen in the cervical spine. Electronically Signed   By: Marijo Conception, M.D.   On: 02/24/2019 20:34   Ct Cervical Spine Wo Contrast  Result Date: 02/24/2019 CLINICAL DATA:  Seizure. EXAM: CT HEAD WITHOUT CONTRAST CT CERVICAL SPINE WITHOUT CONTRAST TECHNIQUE: Multidetector CT imaging of the head and cervical spine was performed following the standard protocol without intravenous contrast. Multiplanar CT image reconstructions of the cervical spine were also generated. COMPARISON:  None. FINDINGS: CT HEAD FINDINGS Brain: Right frontal encephalomalacia is noted which extends into right basal ganglia and is consistent with old infarction. No mass effect or midline shift is noted. Ventricular size is within normal limits. There is no evidence of mass lesion, hemorrhage or acute infarction. Vascular: No hyperdense vessel or unexpected calcification. Skull: Normal. Negative for fracture or focal lesion. Sinuses/Orbits: No acute finding. Other: None. CT CERVICAL SPINE FINDINGS Alignment: Normal. Skull base and vertebrae: No acute fracture. No primary bone lesion or focal pathologic  process. Soft tissues and spinal canal: No prevertebral fluid or swelling. No visible canal hematoma. Disc levels: Severe degenerative disc disease is noted at C4-5, C5-6 and C6-7 with anterior posterior osteophyte formation. Upper chest: Negative. Other: None. IMPRESSION: Old right frontal infarction. No acute intracranial abnormality seen. Severe multilevel degenerative disc disease. No acute abnormality seen in the cervical spine. Electronically Signed   By: Marijo Conception, M.D.   On: 02/24/2019 20:34   Dg Chest Port 1 View  Result Date: 02/24/2019 CLINICAL DATA:  Post CPR EXAM: PORTABLE CHEST 1 VIEW COMPARISON:  09/30/2018 FINDINGS: Cardiac enlargement without heart failure or edema. Vague airspace disease in the left lung base. Cardiac and mediastinal structures shifted to the left suggesting left lower lobe collapse although pneumonia not excluded. Possible left effusion. Atherosclerotic aortic arch Defibrillator pad overlying the mediastinum. IMPRESSION: Left lower lobe airspace disease with  volume loss compatible with atelectasis and possible effusion. Electronically Signed   By: Franchot Gallo M.D.   On: 02/24/2019 17:34      LOS: 0 days   Time spent: More than 50% of that time was spent in counseling and/or coordination of care.  Antonieta Pert, MD [facility]  02/25/2019, 11:42 AM

## 2019-02-25 NOTE — Progress Notes (Signed)
Echocardiogram 2D Echocardiogram has been performed.  Kari Robinson 02/25/2019, 9:35 AM

## 2019-02-26 ENCOUNTER — Telehealth: Payer: Self-pay | Admitting: Physician Assistant

## 2019-02-26 DIAGNOSIS — R55 Syncope and collapse: Secondary | ICD-10-CM | POA: Diagnosis not present

## 2019-02-26 DIAGNOSIS — F17210 Nicotine dependence, cigarettes, uncomplicated: Secondary | ICD-10-CM | POA: Diagnosis not present

## 2019-02-26 DIAGNOSIS — F1010 Alcohol abuse, uncomplicated: Secondary | ICD-10-CM | POA: Diagnosis not present

## 2019-02-26 DIAGNOSIS — I2510 Atherosclerotic heart disease of native coronary artery without angina pectoris: Secondary | ICD-10-CM | POA: Diagnosis not present

## 2019-02-26 LAB — BASIC METABOLIC PANEL
Anion gap: 15 (ref 5–15)
BUN: 38 mg/dL — ABNORMAL HIGH (ref 8–23)
CO2: 23 mmol/L (ref 22–32)
Calcium: 9.1 mg/dL (ref 8.9–10.3)
Chloride: 100 mmol/L (ref 98–111)
Creatinine, Ser: 6.11 mg/dL — ABNORMAL HIGH (ref 0.44–1.00)
GFR calc Af Amer: 8 mL/min — ABNORMAL LOW (ref >60)
GFR calc non Af Amer: 6 mL/min — ABNORMAL LOW (ref >60)
Glucose, Bld: 106 mg/dL — ABNORMAL HIGH (ref 70–99)
Potassium: 4 mmol/L (ref 3.5–5.1)
Sodium: 138 mmol/L (ref 135–145)

## 2019-02-26 MED ORDER — HYDROCODONE-ACETAMINOPHEN 5-325 MG PO TABS: 1.0000 | ORAL_TABLET | Freq: Four times a day (QID) | ORAL | 0 refills | Status: DC | PRN

## 2019-02-26 MED ORDER — LIDOCAINE 5 % EX PTCH: 1.0000 | MEDICATED_PATCH | CUTANEOUS | 0 refills | Status: AC

## 2019-02-26 MED ORDER — LIDOCAINE 5 % EX PTCH
1.0000 | MEDICATED_PATCH | Freq: Every day | CUTANEOUS | Status: DC
  Administered 2019-02-26: 10:00:00 1 via TRANSDERMAL
  Filled 2019-02-26: qty 1

## 2019-02-26 MED ORDER — HYDROCODONE-ACETAMINOPHEN 5-325 MG PO TABS
1.0000 | ORAL_TABLET | ORAL | Status: DC | PRN
  Administered 2019-02-26: 10:00:00 2 via ORAL
  Filled 2019-02-26: qty 2

## 2019-02-26 NOTE — Progress Notes (Signed)
Patient being discharged, we will arrange outpatient follow up.     Carlyle Dolly MD

## 2019-02-26 NOTE — Progress Notes (Addendum)
Patient ready for discharge to home; discharge instructions given and reviewed; Rx sent electronically; patient discharged via ambulance ([facility]) transport to her home; husband called and awaiting her transport to home.  All belongings returned.

## 2019-02-26 NOTE — Evaluation (Signed)
Physical Therapy Evaluation Patient Details Name: Kari Robinson MRN: [id number] DOB: [birth year] Today's Date: 02/26/2019   History of Present Illness  Pt is a 68 y.o. F with significant PMH of ESRD on dialysis, hypertension, COPD with 2L oxygen requirement, who was brought to ED for evaluation of seizure like activity. Pt had completed her dialysis session when she was noted to have seizure like activity, unable to find a pulse, and began CPR reportedly for about 40 minutes.  Clinical Impression  Pt admitted with above. Pt presenting with limited mobility secondary to chest pain from CPR. Education provided on splinting with pillow for reducing pain. Pt seems close to functional baseline, ambulating x 50 feet with Rollator and no physical assistance. BP 131/72 (96), SpO2 97% on 2L O2. Able to don clothes with supervision. No PT follow up recommended. Pt d/c from hospital 4/12.     Follow Up Recommendations No PT follow up    Equipment Recommendations  None recommended by PT    Recommendations for Other Services       Precautions / Restrictions Precautions Precautions: None Restrictions Weight Bearing Restrictions: No      Mobility  Bed Mobility Overal bed mobility: Independent                Transfers Overall transfer level: Needs assistance Equipment used: None Transfers: Sit to/from Stand Sit to Stand: Supervision            Ambulation/Gait Ambulation/Gait assistance: Supervision Gait Distance (Feet): 50 Feet Assistive device: 4-wheeled walker Gait Pattern/deviations: Step-through pattern;Decreased stride length Gait velocity: decr   General Gait Details: Pt slow, but overall fairly steady with Rollator.   Stairs            Wheelchair Mobility    Modified Rankin (Stroke Patients Only)       Balance Overall balance assessment: Mild deficits observed, not formally tested                                            Pertinent Vitals/Pain Pain Assessment: Faces Faces Pain Scale: Hurts little more Pain Location: chest from CPR Pain Descriptors / Indicators: Discomfort;Grimacing;Guarding Pain Intervention(s): Monitored during session;Other (comment)(educated on splinting technique)    Home Living Family/patient expects to be discharged to:: Private residence Living Arrangements: Alone Available Help at Discharge: Family Type of Home: House         Home Equipment: Environmental consultant - 4 wheels Additional Comments: Pt husband lives "next to her."    Prior Function Level of Independence: Independent with assistive device(s)         Comments: uses Film/video editor Dominance        Extremity/Trunk Assessment   Upper Extremity Assessment Upper Extremity Assessment: Overall WFL for tasks assessed    Lower Extremity Assessment Lower Extremity Assessment: Overall WFL for tasks assessed       Communication   Communication: No difficulties  Cognition Arousal/Alertness: Awake/alert Behavior During Therapy: WFL for tasks assessed/performed Overall Cognitive Status: Within Functional Limits for tasks assessed                                        General Comments      Exercises     Assessment/Plan    PT Assessment Patent  does not need any further PT services  PT Problem List         PT Treatment Interventions      PT Goals (Current goals can be found in the Care Plan section)  Acute Rehab PT Goals Patient Stated Goal: "go home." PT Goal Formulation: All assessment and education complete, DC therapy    Frequency     Barriers to discharge        Co-evaluation               AM-PAC PT "6 Clicks" Mobility  Outcome Measure Help needed turning from your back to your side while in a flat bed without using bedrails?: None Help needed moving from lying on your back to sitting on the side of a flat bed without using bedrails?: None Help needed moving to  and from a bed to a chair (including a wheelchair)?: None Help needed standing up from a chair using your arms (e.g., wheelchair or bedside chair)?: None Help needed to walk in hospital room?: None Help needed climbing 3-5 steps with a railing? : A Little 6 Click Score: 23    End of Session Equipment Utilized During Treatment: Oxygen Activity Tolerance: Patient tolerated treatment well Patient left: Other (comment)(on stretcher discharging) Nurse Communication: Mobility status PT Visit Diagnosis: Difficulty in walking, not elsewhere classified (R26.2)    Time: 1884-1660 PT Time Calculation (min) (ACUTE ONLY): 17 min   Charges:   PT Evaluation $PT Eval Moderate Complexity: 1 Mod         Ellamae Sia, [place], DPT Acute Rehabilitation Services Pager [phone] Office [phone]   Willy Eddy 02/26/2019, 3:43 PM

## 2019-02-26 NOTE — Telephone Encounter (Signed)
error 

## 2019-02-26 NOTE — Care Management Obs Status (Signed)
[facility] NOTIFICATION   Patient Details  Name: Kari Robinson MRN: [id number] Date of Birth: 07-Aug-1951   Medicare Observation Status Notification Given:  Yes    Carles Collet, RN 02/26/2019, 8:41 AM

## 2019-02-26 NOTE — Discharge Summary (Signed)
Physician Discharge Summary  Kari Robinson DJS:[id number] DOB: 05/20/1951 DOA: 02/24/2019  PCP: Patient, [name]  Admit date: 02/24/2019 Discharge date: 02/26/2019  Admitted From: Home Disposition: Home  Recommendations for Outpatient Follow-up:  1. Follow up with PCP in 1-2 weeks 2.   Home Health: none Equipment/Devices:none  Discharge Condition: Stable CODE STATUS: Full code Diet recommendation: Cardiac and renal diet  Brief/Interim Summary: -year-old female with significant history of ESRD on dialysis, hypertension, ischemic cardiomyopathy, COPD with 2 L oxygen depression/anxiety brought to the ER for evaluation of seizure-like activity.  Patient had completed her dialysis session and was waiting on abeyance for a ride back home when she was noted to have seizure-like activity, dialysis pressure neurologist apparently unable to find a pulse and began CPR reportedly for about 40 minutes, upon EMS arrival found femoral pulse, GCS was 3 initially and patient became fully oriented during the transfer to the ER.  Complained of chest pain following CPR.  In the ER afebrile, saturating well on 2 L/min of supplemental oxygen, and hypertensive to 180/100. EKG features a sinus rhythm with PVCs, LVH, repolarization abnormality, inferior ST depressions, and QTc interval of 502 ms. Chemistry panel is notable for anion gap of 25. CBC features a stable anemia with hemoglobin 9.3. Troponin is slightly elevated at 0.04. Patient was given 50 mcg of fentanyl and 250 cc normal saline in the ED. Remains hemodynamically stable, in no apparent respiratory distress, and will be observed for ongoing evaluation and management. Patient was admitted underwent extensive work-up with echocardiogram, EEG MRI brain labs EKG. At this time she is doing well, seen by cardiology.  Also discussed with neurology.  No further recommendation and is being discharged home in medically stable condition.   Assessment  & Plan:   Transient loss of consciousness: Reported seizure-like activity, patient was given CPR is apparently not able to find a pulse however upon EMS arrival she did have femoral pulse.  Unclear etiology, likely seizure vs syncope.CT showed" Old right frontal infarction. No acute intracranial abnormality seen", given her prior, postdialysis it could lower seizure threshold and also on wellbutrin. I discussed with neurologist Dr. Londell Robinson, who recommended MRI did not show any acute stroke or any other acute finding, he does not recommend antiseizure medication for now., She has been hemodynamically stable, no obvious evidence of ACS, troponin 0 0.06, 0.070.08 flat, in the setting of ESRD, CPR. EKG shows prolonged QTC at 561- held off seroquela, seen by cardio, QTC now improved to 490. Seen by cardio .echocardiogram unchanged.UDS negative.  At this time she is medically stable.  Advised to follow-up with neurology outpatient, follow-up/avoid psychotropic medication that can lower seizure threshold, encouraged to discuss with PCP/psychiatry for alternatives.  Ischemic cardiomyopathy. + troponin flat borderline, no evidence of ACS.  Echocardiogram unchanged.Continue on her aspirin/Plavix/Lipitor/Imdur/metoprolol .  COPD, chronic hypoxic respiratory failure, stable, on home oxygen 2 L, continue ICS/LABA/L AMA and as needed bronchodilators.   Depression with anxiety: stable. Hold home Seroquel.   ESRD: Last HD Friday. Bmp stable.  DVT prophylaxis: SQ Heparin Code Status: FULL Family Communication:patient Disposition Plan: d/c home  Consultants:  Neurology  Discharge Diagnoses:  Principal Problem:   Transient loss of consciousness Active Problems:   COPD (chronic obstructive pulmonary disease) ([name])   Depression with anxiety   CAD (coronary artery disease)   ESRD (end stage renal disease) ([facility])   Seizure-like activity ([facility])   Ischemic cardiomyopathy    Discharge  Instructions  Discharge Instructions  Call MD for:   Complete by:  As directed    Please discuss with your MD regarding your psychotropic medications Wellbutrin and Seroquel for alternatives.   Call MD for:  difficulty breathing, headache or visual disturbances   Complete by:  As directed    Call MD for:  persistant nausea and vomiting   Complete by:  As directed    Call MD for:  severe uncontrolled pain   Complete by:  As directed    Diet - low sodium heart healthy   Complete by:  As directed    Increase activity slowly   Complete by:  As directed      Allergies as of 02/26/2019      Reactions   Citalopram Hydrobromide Hives   Morphine And Related Itching      Medication List    STOP taking these medications   buPROPion 150 MG 12 hr tablet Commonly known as:  WELLBUTRIN SR   lidocaine-prilocaine cream Commonly known as:  EMLA   QUEtiapine 200 MG tablet Commonly known as:  SEROQUEL     TAKE these medications   albuterol 108 (90 Base) MCG/ACT inhaler Commonly known as:  PROVENTIL HFA;VENTOLIN HFA Inhale 2 puffs into the lungs every 4 (four) hours as needed for wheezing.   albuterol (2.5 MG/3ML) 0.083% nebulizer solution Commonly known as:  PROVENTIL Take 2.5 mg by nebulization every 6 (six) hours as needed for wheezing or shortness of breath.   aspirin EC 81 MG tablet Take 81 mg by mouth daily.   atorvastatin 40 MG tablet Commonly known as:  LIPITOR Take 40 mg by mouth daily.   b complex-vitamin c-folic acid 0.8 MG Tabs tablet Take 1 tablet by mouth See admin instructions. Take one tablet by mouth on Sunday, Tuesday, Thursday, Saturday mornings. Take one tablet after dialysis on Monday, Wednesday, Friday   budesonide-formoterol 160-4.5 MCG/ACT inhaler Commonly known as:  SYMBICORT Inhale 2 puffs into the lungs 2 (two) times daily.   clopidogrel 75 MG tablet Commonly known as:  PLAVIX Take 75 mg by mouth daily.   hydrALAZINE 100 MG tablet Commonly known  as:  APRESOLINE Take 1 tablet (100 mg total) by mouth 3 (three) times daily.   HYDROcodone-acetaminophen 5-325 MG tablet Commonly known as:  NORCO/VICODIN Take 1 tablet by mouth every 6 (six) hours as needed for up to 12 doses for severe pain.   ipratropium-albuterol 0.5-2.5 (3) MG/3ML Soln Commonly known as:  DUONEB Take 3 mLs by nebulization every 6 (six) hours as needed.   isosorbide mononitrate 30 MG 24 hr tablet Commonly known as:  IMDUR Take 30 mg by mouth at bedtime.   lidocaine 5 % Commonly known as:  LIDODERM Place 1 patch onto the skin daily for 14 days. Remove & Discard patch within 12 hours or as directed by MD   OXYGEN Inhale 2 L into the lungs continuous.   sodium bicarbonate 650 MG tablet Take 1 tablet (650 mg total) by mouth 2 (two) times daily.   torsemide 20 MG tablet Commonly known as:  DEMADEX Take 2 tablets (40 mg total) by mouth daily.      Follow-up Information    PCP Follow up in 1 week(s).          Allergies  Allergen Reactions  . Citalopram Hydrobromide Hives  . Morphine And Related Itching     Consultations:  Cardiology  Neurology  Procedures/Studies: Ct Head Wo Contrast  Result Date: 02/24/2019 CLINICAL DATA:  Seizure. EXAM: CT  HEAD WITHOUT CONTRAST CT CERVICAL SPINE WITHOUT CONTRAST TECHNIQUE: Multidetector CT imaging of the head and cervical spine was performed following the standard protocol without intravenous contrast. Multiplanar CT image reconstructions of the cervical spine were also generated. COMPARISON:  None. FINDINGS: CT HEAD FINDINGS Brain: Right frontal encephalomalacia is noted which extends into right basal ganglia and is consistent with old infarction. No mass effect or midline shift is noted. Ventricular size is within normal limits. There is no evidence of mass lesion, hemorrhage or acute infarction. Vascular: No hyperdense vessel or unexpected calcification. Skull: Normal. Negative for fracture or focal lesion.  Sinuses/Orbits: No acute finding. Other: None. CT CERVICAL SPINE FINDINGS Alignment: Normal. Skull base and vertebrae: No acute fracture. No primary bone lesion or focal pathologic process. Soft tissues and spinal canal: No prevertebral fluid or swelling. No visible canal hematoma. Disc levels: Severe degenerative disc disease is noted at C4-5, C5-6 and C6-7 with anterior posterior osteophyte formation. Upper chest: Negative. Other: None. IMPRESSION: Old right frontal infarction. No acute intracranial abnormality seen. Severe multilevel degenerative disc disease. No acute abnormality seen in the cervical spine. Electronically Signed   By: Marijo Conception, M.D.   On: 02/24/2019 20:34   Ct Cervical Spine Wo Contrast  Result Date: 02/24/2019 CLINICAL DATA:  Seizure. EXAM: CT HEAD WITHOUT CONTRAST CT CERVICAL SPINE WITHOUT CONTRAST TECHNIQUE: Multidetector CT imaging of the head and cervical spine was performed following the standard protocol without intravenous contrast. Multiplanar CT image reconstructions of the cervical spine were also generated. COMPARISON:  None. FINDINGS: CT HEAD FINDINGS Brain: Right frontal encephalomalacia is noted which extends into right basal ganglia and is consistent with old infarction. No mass effect or midline shift is noted. Ventricular size is within normal limits. There is no evidence of mass lesion, hemorrhage or acute infarction. Vascular: No hyperdense vessel or unexpected calcification. Skull: Normal. Negative for fracture or focal lesion. Sinuses/Orbits: No acute finding. Other: None. CT CERVICAL SPINE FINDINGS Alignment: Normal. Skull base and vertebrae: No acute fracture. No primary bone lesion or focal pathologic process. Soft tissues and spinal canal: No prevertebral fluid or swelling. No visible canal hematoma. Disc levels: Severe degenerative disc disease is noted at C4-5, C5-6 and C6-7 with anterior posterior osteophyte formation. Upper chest: Negative. Other: None.  IMPRESSION: Old right frontal infarction. No acute intracranial abnormality seen. Severe multilevel degenerative disc disease. No acute abnormality seen in the cervical spine. Electronically Signed   By: Marijo Conception, M.D.   On: 02/24/2019 20:34   Mr Brain Wo Contrast  Addendum Date: 02/25/2019   ADDENDUM REPORT: 02/25/2019 19:46 ADDENDUM: Suspected occlusion of the distal left vertebral artery. Electronically Signed   By: Logan Bores M.D.   On: 02/25/2019 19:46   Result Date: 02/25/2019 CLINICAL DATA:  Seizure.  Syncope. EXAM: MRI HEAD WITHOUT CONTRAST TECHNIQUE: Multiplanar, multiecho pulse sequences of the brain and surrounding structures were obtained without intravenous contrast. COMPARISON:  Head CT 02/24/2019 FINDINGS: Brain: There is a moderately large chronic infarct involving the right frontal lobe, basal ganglia, and anterior right temporal stem with associated hemosiderin staining and ex vacuo dilatation of the right frontal horn. Wallerian degeneration is noted in the right cerebral peduncle. Small foci of T2 hyperintensity elsewhere in the cerebral white matter bilaterally are nonspecific but compatible with mild-to-moderate chronic small vessel ischemic disease. There are chronic lacunar infarcts in the corona radiata bilaterally. Dedicated temporal lobe imaging demonstrates symmetric size of the hippocampi without focal signal abnormality. No acute infarct, mass, midline  shift, or extra-axial fluid collection is identified. Vascular: Loss of the normal distal left vertebral artery flow void. Other major intracranial vascular flow voids are preserved. Skull and upper cervical spine: Unremarkable bone marrow signal. C4-5 disc degeneration. Sinuses/Orbits: Unremarkable orbits. Trace right mastoid effusion. No significant paranasal sinus inflammatory disease. Other: Focal right frontal scalp scarring. IMPRESSION: 1. No acute intracranial abnormality. 2. 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 3. Mild-to-moderate chronic small vessel ischemic disease. Electronically Signed: By: Logan Bores M.D. On: 02/25/2019 16:32   Dg Chest Port 1 View  Result Date: 02/24/2019 CLINICAL DATA:  Post CPR EXAM: PORTABLE CHEST 1 VIEW COMPARISON:  09/30/2018 FINDINGS: Cardiac enlargement without heart failure or edema. Vague airspace disease in the left lung base. Cardiac and mediastinal structures shifted to the left suggesting left lower lobe collapse although pneumonia not excluded. Possible left effusion. Atherosclerotic aortic arch Defibrillator pad overlying the mediastinum. IMPRESSION: Left lower lobe airspace disease with volume loss compatible with atelectasis and possible effusion. Electronically Signed   By: Franchot Gallo M.D.   On: 02/24/2019 17:34   Echo, EEG, MRI brain   Subjective: Resting well.  Chest discomfort at the site of the CPR, taking pain medication.  Denies nausea vomiting fever chills shortness of breath.  Discharge Exam: Vitals:   02/26/19 0407 02/26/19 0739  BP:  (!) 149/96  Pulse:  87  Resp:  18  Temp:    SpO2: 97% 95%   Vitals:   02/26/19 0300 02/26/19 0405 02/26/19 0407 02/26/19 0739  BP: (!) 148/78 (!) 158/88  (!) 149/96  Pulse: 93   87  Resp: 18 (!) 22  18  Temp: 98.9 F (37.2 C)     TempSrc: Oral     SpO2: 94%  97% 95%  Weight:      Height:        General: Pt is alert, awake, not in acute distress Cardiovascular: RRR, S1/S2 +, no rubs, no gallops Respiratory: CTA bilaterally, no wheezing, no rhonchi Abdominal: Soft, NT, ND, bowel sounds + Extremities: no edema, no cyanosis   The results of significant diagnostics from this hospitalization (including imaging, microbiology, ancillary and laboratory) are listed below for reference.     Microbiology: Recent Results (from the past 240 hour(s))  MRSA PCR Screening     Status: None   Collection Time: 02/24/19 11:27 PM  Result Value Ref Range Status   MRSA by PCR  NEGATIVE NEGATIVE Final    Comment:        The GeneXpert MRSA Assay (FDA approved for NASAL specimens only), is one component of a comprehensive MRSA colonization surveillance program. It is not intended to diagnose MRSA infection nor to guide or monitor treatment for MRSA infections. Performed at [facility], [place] [address] [place], [place] [address]      Labs: BNP (last 3 results) Recent Labs    06/06/18 2236 06/23/18 1040 09/30/18 0256  BNP 2,726.8* 3,557.5* >6,712.4*   Basic Metabolic Panel: Recent Labs  Lab 02/24/19 1654 02/24/19 1702 02/25/19 0314 02/26/19 0509  NA 136 135 137 138  K 3.5 3.4* 3.1* 4.0  CL 88*  --  93* 100  CO2 23  --  27 23  GLUCOSE 165*  --  97 106*  BUN 7*  --  13 38*  CREATININE 2.47*  --  3.31* 6.11*  CALCIUM 8.4*  --  9.1 9.1  MG  --   --  2.4  --  Liver Function Tests: Recent Labs  Lab 02/24/19 1654  AST 39  ALT 21  ALKPHOS 99  BILITOT 0.4  PROT 7.1  ALBUMIN 3.9   No results for input(s): LIPASE, AMYLASE in the last 168 hours. No results for input(s): AMMONIA in the last 168 hours. CBC: Recent Labs  Lab 02/24/19 1654 02/24/19 1702 02/25/19 0314  WBC 7.4  --  6.2  NEUTROABS 5.1  --   --   HGB 9.3* 10.5* 8.8*  HCT 30.9* 31.0* 28.7*  MCV 103.7*  --  98.3  PLT 232  --  192   Cardiac Enzymes: Recent Labs  Lab 02/24/19 1654 02/24/19 2211 02/25/19 0314 02/25/19 0942  TROPONINI 0.04* 0.06* 0.07* 0.08*   BNP: Invalid input(s): POCBNP CBG: No results for input(s): GLUCAP in the last 168 hours. D-Dimer No results for input(s): DDIMER in the last 72 hours. Hgb A1c No results for input(s): HGBA1C in the last 72 hours. Lipid Profile No results for input(s): CHOL, HDL, LDLCALC, TRIG, CHOLHDL, LDLDIRECT in the last 72 hours. Thyroid function studies No results for input(s): TSH, T4TOTAL, T3FREE, THYROIDAB in the last 72 hours.  Invalid input(s): FREET3 Anemia work up No results for input(s): VITAMINB12,  FOLATE, FERRITIN, TIBC, IRON, RETICCTPCT in the last 72 hours. Urinalysis    Component Value Date/Time   COLORURINE STRAW (A) 02/25/2019 0735   APPEARANCEUR CLEAR 02/25/2019 0735   LABSPEC 1.004 (L) 02/25/2019 0735   PHURINE 9.0 (H) 02/25/2019 0735   GLUCOSEU NEGATIVE 02/25/2019 0735   HGBUR SMALL (A) 02/25/2019 0735   BILIRUBINUR NEGATIVE 02/25/2019 [place] 02/25/2019 0735   PROTEINUR 100 (A) 02/25/2019 0735   NITRITE NEGATIVE 02/25/2019 0735   LEUKOCYTESUR NEGATIVE 02/25/2019 0735   Sepsis Labs Invalid input(s): PROCALCITONIN,  WBC,  LACTICIDVEN Microbiology Recent Results (from the past 240 hour(s))  MRSA PCR Screening     Status: None   Collection Time: 02/24/19 11:27 PM  Result Value Ref Range Status   MRSA by PCR NEGATIVE NEGATIVE Final    Comment:        The GeneXpert MRSA Assay (FDA approved for NASAL specimens only), is one component of a comprehensive MRSA colonization surveillance program. It is not intended to diagnose MRSA infection nor to guide or monitor treatment for MRSA infections. Performed at [facility], [place] [address], [place], [place] [address]      Time coordinating discharge: 25 minutes  SIGNED:   Antonieta Pert, MD  [facility] 02/26/2019, 10:32 AM  If 7PM-7AM, please contact night-coverage [email or web address]

## 2019-02-26 NOTE — Discharge Instructions (Signed)

## 2019-02-27 ENCOUNTER — Emergency Department (HOSPITAL_COMMUNITY)
Admission: EM | Admit: 2019-02-27 | Discharge: 2019-02-27 | Disposition: A | Discharge status: Home / Self Care | Payer: Medicare Other | Attending: Emergency Medicine | Admitting: Emergency Medicine

## 2019-02-27 ENCOUNTER — Emergency Department (HOSPITAL_COMMUNITY): Payer: Medicare Other

## 2019-02-27 ENCOUNTER — Encounter (HOSPITAL_COMMUNITY): Payer: Self-pay | Admitting: Emergency Medicine

## 2019-02-27 DIAGNOSIS — F17210 Nicotine dependence, cigarettes, uncomplicated: Secondary | ICD-10-CM | POA: Diagnosis not present

## 2019-02-27 DIAGNOSIS — I5022 Chronic systolic (congestive) heart failure: Secondary | ICD-10-CM | POA: Insufficient documentation

## 2019-02-27 DIAGNOSIS — Z7982 Long term (current) use of aspirin: Secondary | ICD-10-CM | POA: Insufficient documentation

## 2019-02-27 DIAGNOSIS — N186 End stage renal disease: Secondary | ICD-10-CM | POA: Insufficient documentation

## 2019-02-27 DIAGNOSIS — Z992 Dependence on renal dialysis: Secondary | ICD-10-CM | POA: Insufficient documentation

## 2019-02-27 DIAGNOSIS — I132 Hypertensive heart and chronic kidney disease with heart failure and with stage 5 chronic kidney disease, or end stage renal disease: Secondary | ICD-10-CM | POA: Diagnosis not present

## 2019-02-27 DIAGNOSIS — E1122 Type 2 diabetes mellitus with diabetic chronic kidney disease: Secondary | ICD-10-CM | POA: Insufficient documentation

## 2019-02-27 DIAGNOSIS — I10 Essential (primary) hypertension: Secondary | ICD-10-CM

## 2019-02-27 DIAGNOSIS — I252 Old myocardial infarction: Secondary | ICD-10-CM | POA: Diagnosis not present

## 2019-02-27 DIAGNOSIS — Z79899 Other long term (current) drug therapy: Secondary | ICD-10-CM | POA: Diagnosis not present

## 2019-02-27 DIAGNOSIS — Z7901 Long term (current) use of anticoagulants: Secondary | ICD-10-CM | POA: Insufficient documentation

## 2019-02-27 DIAGNOSIS — J449 Chronic obstructive pulmonary disease, unspecified: Secondary | ICD-10-CM | POA: Diagnosis not present

## 2019-02-27 DIAGNOSIS — R0789 Other chest pain: Secondary | ICD-10-CM | POA: Diagnosis not present

## 2019-02-27 LAB — BASIC METABOLIC PANEL
Anion gap: 22 — ABNORMAL HIGH (ref 5–15)
BUN: 55 mg/dL — ABNORMAL HIGH (ref 8–23)
CO2: 16 mmol/L — ABNORMAL LOW (ref 22–32)
Calcium: 9.5 mg/dL (ref 8.9–10.3)
Chloride: 101 mmol/L (ref 98–111)
Creatinine, Ser: 7.91 mg/dL — ABNORMAL HIGH (ref 0.44–1.00)
GFR calc Af Amer: 6 mL/min — ABNORMAL LOW (ref >60)
GFR calc non Af Amer: 5 mL/min — ABNORMAL LOW (ref >60)
Glucose, Bld: 100 mg/dL — ABNORMAL HIGH (ref 70–99)
Potassium: 4.4 mmol/L (ref 3.5–5.1)
Sodium: 139 mmol/L (ref 135–145)

## 2019-02-27 LAB — CBC WITH DIFFERENTIAL/PLATELET
Abs Immature Granulocytes: 0.02 10*3/uL (ref 0.00–0.07)
Basophils Absolute: 0.1 10*3/uL (ref 0.0–0.1)
Basophils Relative: 1 %
Eosinophils Absolute: 0.2 10*3/uL (ref 0.0–0.5)
Eosinophils Relative: 3 %
HCT: 33.4 % — ABNORMAL LOW (ref 36.0–46.0)
Hemoglobin: 9.5 g/dL — ABNORMAL LOW (ref 12.0–15.0)
Immature Granulocytes: 0 %
Lymphocytes Relative: 9 %
Lymphs Abs: 0.8 10*3/uL (ref 0.7–4.0)
MCH: 30.6 pg (ref 26.0–34.0)
MCHC: 28.4 g/dL — ABNORMAL LOW (ref 30.0–36.0)
MCV: 107.7 fL — ABNORMAL HIGH (ref 80.0–100.0)
Monocytes Absolute: 0.8 10*3/uL (ref 0.1–1.0)
Monocytes Relative: 9 %
Neutro Abs: 6.8 10*3/uL (ref 1.7–7.7)
Neutrophils Relative %: 78 %
Platelets: 251 10*3/uL (ref 150–400)
RBC: 3.1 MIL/uL — ABNORMAL LOW (ref 3.87–5.11)
RDW: 17.6 % — ABNORMAL HIGH (ref 11.5–15.5)
WBC: 8.7 10*3/uL (ref 4.0–10.5)
nRBC: 0 % (ref 0.0–0.2)

## 2019-02-27 LAB — TROPONIN I: Troponin I: 0.04 ng/mL (ref <0.03)

## 2019-02-27 MED ORDER — FENTANYL CITRATE (PF) 100 MCG/2ML IJ SOLN
50.0000 ug | Freq: Once | INTRAMUSCULAR | Status: AC
  Administered 2019-02-27: 50 ug via INTRAVENOUS
  Filled 2019-02-27: qty 2

## 2019-02-27 MED ORDER — DICLOFENAC SODIUM 1 % TD GEL: 2.0000 g | Freq: Four times a day (QID) | TRANSDERMAL | 0 refills | Status: DC

## 2019-02-27 NOTE — ED Notes (Signed)
[facility] called to transport pt home 

## 2019-02-27 NOTE — ED Provider Notes (Signed)
Medical screening examination/treatment/procedure(s) were conducted as a shared visit with non-physician practitioner(s) and myself.  I personally evaluated the patient during the encounter. Briefly, the patient is a 68 y.o. female with history of COPD on 2 L of oxygen, CAD, end-stage renal disease on hemodialysis who presents the ED with chest wall pain.  Patient had recent admission after she had a possible cardiac arrest.  He had episode of seizure/syncope after dialysis.  Briefly had CPR for several minutes at that time.  She was admitted to the hospital this past week and had overall unremarkable work-up.  Cardiology did not believe there was any cardiac etiology in her arrest.  Patient has had chest wall pain since discharge.  She is on lidocaine patch and other pain medications.  She is having continued chest wall pain.  She states home medications have not helped.  Has reproducible tenderness on exam.  Clear breath sounds.  EKG showed sinus tachycardia.  No ischemic changes.  Unchanged from prior EKGs.  Troponin mildly elevated but improved from prior troponins.  She has chronic troponin elevation.  Otherwise no significant lab findings.  Her history and physical is very consistent with a musculoskeletal chest pain that is likely secondary to CPR. CXR with no fracutres. We will get her incentive spirometer, prescription for Voltaren gel.  Recommend that she continue her home medications including lidocaine patch.  Given return precautions and discharged in ED in good condition.  This chart was dictated using voice recognition software.  Despite best efforts to proofread,  errors can occur which can change the documentation meaning.     EKG Interpretation  Date/Time:  Monday February 27 2019 11:57:05 EDT Ventricular Rate:  101 PR Interval:    QRS Duration: 106 QT Interval:  341 QTC Calculation: 442 R Axis:   79 Text Interpretation:  Sinus tachycardia Ventricular bigeminy RSR' in V1 or V2, probably  normal variant LVH with secondary repolarization abnormality ST depression, consider ischemia, diffuse lds Confirmed by Lennice Sites [phone]) on 02/27/2019 12:03:59 PM           Lennice Sites, DO 02/27/19 1334

## 2019-02-27 NOTE — ED Triage Notes (Addendum)
Pt from home had cardiac arrest last week and had CPR now c/o of cp and sob had 4 stents placed , due for dialysis today just d/c yesterday on home o2, pt took her vicodin before she left home

## 2019-02-27 NOTE — ED Provider Notes (Addendum)
[facility] EMERGENCY DEPARTMENT Provider Note   CSN: [id number] Arrival date & time: 02/27/19  1146    History   Chief Complaint Chief Complaint  Patient presents with  . Chest Pain    HPI Kari Robinson is a 68 y.o. female.     HPI   68 year old female with a significant past medical history of ESRD on dialysis, hypertension, ischemic cardiomyopathy, COPD with 2 L oxygen, depression, anxiety presents today with complaints of chest pain.  Patient was recently discharged from the hospital on 02/24/2019 after syncopal episode.  She was unresponsive and had approximately 4 minutes of CPR.  When EMS arrived she did have femoral pulse.  She was fully oriented by the time she arrived at the emergency room.  She was complaining of chest pain following the CPR.  She had an evident work-up including echocardiogram, EEG, MRI brain, labs and EKG.  Per chart review she does have CAD with prior MI receiving stents in 2005 and 2016.  She was evaluated by cardiology as an inpatient with no changes in troponin, there perspective this was not consistent with ACS.   Patient notes that since the CPR she has had soreness in her chest, she describes as pressure.  She notes this is worse with movement, worse with palpation.  He notes some minor shortness of breath that has been present since that time.  She denies any changes in her chest pain.  She denies any lower extremity swelling or edema.  She has been taking her Norco at home without symptomatic improvement.  She also has a lidocaine patch that was placed on her 3 days ago at the time of discharge.  Past Medical History:  Diagnosis Date  . Acute on chronic heart failure ([place])   . Acute respiratory failure with hypoxia ([place])   . Alcohol abuse   . Arthralgia   . CAD (coronary artery disease)    moderate primarily septal perforater  . CHF (congestive heart failure) ([place])   . COPD (chronic obstructive pulmonary disease) ([place])   .  Depression   . HTN (hypertension)   . Hypercholesterolemia    primarily ldl-p and small particles  . RAS (renal artery stenosis) ([place]) 2002   by cath tysinger  . Renal disorder   . Smoking     Patient Active Problem List   Diagnosis Date Noted  . Transient loss of consciousness 02/24/2019  . ESRD (end stage renal disease) ([place]) 02/24/2019  . Seizure-like activity ([place]) 02/24/2019  . NSTEMI (non-ST elevated myocardial infarction) ([place]) 02/24/2019  . Ischemic cardiomyopathy 02/24/2019  . Hypertensive emergency 09/30/2018  . Type II diabetes mellitus with renal manifestations ([place]) 09/30/2018  . HLD (hyperlipidemia) 09/30/2018  . Depression with anxiety 09/30/2018  . CKD (chronic kidney disease), stage V ([place]) 09/30/2018  . Acute on chronic respiratory failure with hypoxia ([place]) 09/30/2018  . Fluid overload 09/30/2018  . CAD (coronary artery disease) 09/30/2018  . Anemia in CKD (chronic kidney disease) 09/30/2018  . COPD (chronic obstructive pulmonary disease) ([place])   . Acute on chronic combined systolic and diastolic CHF (congestive heart failure) ([place])   . Solitary right kidney 06/24/2018  . Chronic systolic CHF (congestive heart failure) ([place]) 06/24/2018  . Acute CHF (congestive heart failure) ([place]) 06/07/2018  . Hypertensive emergency 06/07/2018  . CKD (chronic kidney disease) stage 5, GFR less than 15 ml/min ([name]) 06/07/2018  . Normocytic normochromic anemia 06/07/2018  . DM (diabetes mellitus), type 2 with renal complications ([place])  06/07/2018  . Hypertensive urgency 06/07/2018  . Occlusion and stenosis of carotid artery without mention of cerebral infarction 01/19/2012  . RENAL ARTERY STENOSIS 09/10/2010  . ABDOMINAL BRUIT 08/12/2010  . DEPRESSION 08/11/2010  . PERIPHERAL VASCULAR DISEASE 08/11/2010  . HYPERTENSION 08/08/2010  . Coronary atherosclerosis 08/08/2010  . ARTHRALGIA 08/08/2010    Past Surgical History:  Procedure Laterality Date  . abdominal aortogram      perclose of the right femoral artery  . AV FISTULA PLACEMENT Right 07/11/2018   Procedure: Right arm ARTERIOVENOUS FISTULA CREATION;  Surgeon: [name], MD;  Location: [facility];  Service: Vascular;  Laterality: Right;  . AVF placement Right   . CARDIAC CATHETERIZATION     left heart catheterization.  Coronary cineangiography. Lft ventricular cineangiography.    [name] LUNG SURGERY     due to lung cancer per patient's report  . TUBAL LIGATION       OB History   No obstetric history on file.      Home Medications    Prior to Admission medications   Medication Sig Start Date End Date Taking? Authorizing Provider  albuterol (PROVENTIL HFA;VENTOLIN HFA) 108 (90 Base) MCG/ACT inhaler Inhale 2 puffs into the lungs every 4 (four) hours as needed for wheezing.  11/25/17   [provider]  albuterol (PROVENTIL) (2.5 MG/3ML) 0.083% nebulizer solution Take 2.5 mg by nebulization every 6 (six) hours as needed for wheezing or shortness of breath.  07/28/18   [provider]  aspirin EC 81 MG tablet Take 81 mg by mouth daily.    [provider]  atorvastatin (LIPITOR) 40 MG tablet Take 40 mg by mouth daily.    [provider]  b complex-vitamin c-folic acid (NEPHRO-VITE) 0.8 MG TABS tablet Take 1 tablet by mouth See admin instructions. Take one tablet by mouth on Sunday, Tuesday, Thursday, Saturday mornings. Take one tablet after dialysis on Monday, Wednesday, Friday 02/06/19   [provider]  budesonide-formoterol (SYMBICORT) 160-4.5 MCG/ACT inhaler Inhale 2 puffs into the lungs 2 (two) times daily.    [provider]  clopidogrel (PLAVIX) 75 MG tablet Take 75 mg by mouth daily.    [provider]  diclofenac sodium (VOLTAREN) 1 % GEL Apply 2 g topically 4 (four) times daily. 02/27/19   Thresa Dozier, Dellis Filbert, PA-C  hydrALAZINE (APRESOLINE) 100 MG tablet Take 1 tablet (100 mg total) by mouth 3 (three) times daily. Patient not taking: Reported on  02/25/2019 10/01/18 02/24/19  Purohit, Konrad Dolores, MD  HYDROcodone-acetaminophen (NORCO/VICODIN) 5-325 MG tablet Take 1 tablet by mouth every 6 (six) hours as needed for up to 12 doses for severe pain. 02/26/19   Antonieta Pert, MD  ipratropium-albuterol (DUONEB) 0.5-2.5 (3) MG/3ML SOLN Take 3 mLs by nebulization every 6 (six) hours as needed. Patient not taking: Reported on 02/25/2019 06/10/18   Dana Allan I, MD  isosorbide mononitrate (IMDUR) 30 MG 24 hr tablet Take 30 mg by mouth at bedtime.     [provider]  lidocaine (LIDODERM) 5 % Place 1 patch onto the skin daily for 14 days. Remove & Discard patch within 12 hours or as directed by MD 02/26/19 03/12/19  Antonieta Pert, MD  OXYGEN Inhale 2 L into the lungs continuous.    [provider]  sodium bicarbonate 650 MG tablet Take 1 tablet (650 mg total) by mouth 2 (two) times daily. Patient not taking: Reported on 02/25/2019 06/09/18   Dana Allan I, MD  torsemide (DEMADEX) 20 MG tablet  Take 2 tablets (40 mg total) by mouth daily. Patient not taking: Reported on 02/25/2019 06/10/18   Bonnell Public, MD    Family History Family History  Problem Relation Age of Onset  . Emphysema Father     Social History Social History   Tobacco Use  . Smoking status: Current Every Day Smoker    Years: 35.00    Types: Cigarettes  . Smokeless tobacco: Never Used  . Tobacco comment: 3-4 cigarettes per day  Substance Use Topics  . Alcohol use: Never    Frequency: Never    Comment: states has quit drinking "a few months ago"  . Drug use: Never     Allergies   Citalopram hydrobromide and Morphine and related   Review of Systems Review of Systems  All other systems reviewed and are negative.    Physical Exam Updated Vital Signs BP (!) 182/96   Pulse 98   Temp 98.3 F (36.8 C) (Oral)   Resp (!) 25   SpO2 100%   Physical Exam Vitals signs and nursing note reviewed.  Constitutional:      Appearance: She is  well-developed.  HENT:     Head: Normocephalic and atraumatic.  Eyes:     General: No scleral icterus.       Right eye: No discharge.        Left eye: No discharge.     Conjunctiva/sclera: Conjunctivae normal.     Pupils: Pupils are equal, round, and reactive to light.  Neck:     Musculoskeletal: Normal range of motion.     Vascular: No JVD.     Trachea: No tracheal deviation.  Cardiovascular:     Rate and Rhythm: Normal rate and regular rhythm.  Pulmonary:     Effort: Pulmonary effort is normal.     Breath sounds: No stridor.     Comments: Exquisite tenderness to palpation of bilateral lower chest wall, no bruising, lung expansion normal, no pain with lateral compression of the ribs, lung sounds clear throughout, no adventitious lung sounds, no respiratory distress Abdominal:     Comments: Circular bruise to the right lower abdomen   Neurological:     Mental Status: She is alert and oriented to person, place, and time.     Coordination: Coordination normal.  Psychiatric:        Behavior: Behavior normal.        Thought Content: Thought content normal.        Judgment: Judgment normal.      ED Treatments / Results  Labs (all labs ordered are listed, but only abnormal results are displayed) Labs Reviewed  CBC WITH DIFFERENTIAL/PLATELET - Abnormal; Notable for the following components:      Result Value   RBC 3.10 (*)    Hemoglobin 9.5 (*)    HCT 33.4 (*)    MCV 107.7 (*)    MCHC 28.4 (*)    RDW 17.6 (*)    All other components within normal limits  BASIC METABOLIC PANEL - Abnormal; Notable for the following components:   CO2 16 (*)    Glucose, Bld 100 (*)    BUN 55 (*)    Creatinine, Ser 7.91 (*)    GFR calc non Af Amer 5 (*)    GFR calc Af Amer 6 (*)    Anion gap 22 (*)    All other components within normal limits  TROPONIN I - Abnormal; Notable for the following components:   Troponin I 0.04 (*)  All other components within normal limits    EKG EKG  Interpretation  Date/Time:  Monday February 27 2019 11:57:05 EDT Ventricular Rate:  101 PR Interval:    QRS Duration: 106 QT Interval:  341 QTC Calculation: 442 R Axis:   79 Text Interpretation:  Sinus tachycardia Ventricular bigeminy RSR' in V1 or V2, probably normal variant LVH with secondary repolarization abnormality ST depression, consider ischemia, diffuse lds Confirmed by Lennice Sites [phone]) on 02/27/2019 12:03:59 PM   Radiology Dg Chest 2 View  Result Date: 02/27/2019 CLINICAL DATA:  Chest pain EXAM: CHEST - 2 VIEW COMPARISON:  02/24/2019 FINDINGS: The heart is borderline enlarged. There is continued shift of the mediastinum to the left and volume loss at the left base. Right lung is clear. Postoperative changes in the mediastinum. No pneumothorax. Pleural thickening at the left base is likely chronic. IMPRESSION: There is chronic volume loss at the left base with pleural thickening. No definite acute process. Electronically Signed   By: Marybelle Killings M.D.   On: 02/27/2019 12:44   Mr Brain Wo Contrast  Addendum Date: 02/25/2019   ADDENDUM REPORT: 02/25/2019 19:46 ADDENDUM: Suspected occlusion of the distal left vertebral artery. Electronically Signed   By: Logan Bores M.D.   On: 02/25/2019 19:46   Result Date: 02/25/2019 CLINICAL DATA:  Seizure.  Syncope. EXAM: MRI HEAD WITHOUT CONTRAST TECHNIQUE: Multiplanar, multiecho pulse sequences of the brain and surrounding structures were obtained without intravenous contrast. COMPARISON:  Head CT 02/24/2019 FINDINGS: Brain: There is a moderately large chronic infarct involving the right frontal lobe, basal ganglia, and anterior right temporal stem with associated hemosiderin staining and ex vacuo dilatation of the right frontal horn. Wallerian degeneration is noted in the right cerebral peduncle. Small foci of T2 hyperintensity elsewhere in the cerebral white matter bilaterally are nonspecific but compatible with mild-to-moderate chronic small  vessel ischemic disease. There are chronic lacunar infarcts in the corona radiata bilaterally. Dedicated temporal lobe imaging demonstrates symmetric size of the hippocampi without focal signal abnormality. No acute infarct, mass, midline shift, or extra-axial fluid collection is identified. Vascular: Loss of the normal distal left vertebral artery flow void. Other major intracranial vascular flow voids are preserved. Skull and upper cervical spine: Unremarkable bone marrow signal. C4-5 disc degeneration. Sinuses/Orbits: Unremarkable orbits. Trace right mastoid effusion. No significant paranasal sinus inflammatory disease. Other: Focal right frontal scalp scarring. IMPRESSION: 1. No acute intracranial abnormality. 2. 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 3. Mild-to-moderate chronic small vessel ischemic disease. Electronically Signed: By: Logan Bores M.D. On: 02/25/2019 16:32    Procedures Procedures (including critical care time)  Medications Ordered in ED Medications  fentaNYL (SUBLIMAZE) injection 50 mcg (50 mcg Intravenous Given 02/27/19 1214)     Initial Impression / Assessment and Plan / ED Course  I have reviewed the triage vital signs and the nursing notes.  Pertinent labs & imaging results that were available during my care of the patient were reviewed by me and considered in my medical decision making (see chart for details).        Labs: CBC, BMP  Imaging: Chest 2 view, ED EKG  Consults:  Therapeutics: Fentanyl  Discharge Meds: Voltaren   Assessment/Plan: 68 year old female presents today with complaints of chest pain.  Patient symptoms are unchanged from her time of disposition from the hospital.  I have a high suspicion for chest wall pain.  I can reproduce her symptoms with palpation of the chest wall.  She has  no acute fractures, her lungs are clear.  She has no signs of dissection, pulmonary embolism, or ACS.  She had a significant cardiac  work-up while hospitalized 3 days ago with no significant abnormalities.  Patient will have repeat troponin here to verify no significant changes from baseline.    Patient's troponin within normal and slightly lower than prior evaluation.  No signs of acute fracture.  Again low suspicion for any acute life-threatening etiology.  Patient will be discharged with Voltaren gel, incentive spirometer and primary care follow-up this week.  She is given strict return precautions.  She verbalized understanding and agreement to today's plan.    Final Clinical Impressions(s) / ED Diagnoses   Final diagnoses:  Chest wall pain  Hypertension, unspecified type    ED Discharge Orders         Ordered    diclofenac sodium (VOLTAREN) 1 % GEL  4 times daily     02/27/19 1336           Okey Regal, PA-C 02/27/19 1401    Okey Regal, PA-C 02/27/19 1427    Okey Regal, PA-C 02/27/19 1451    [place], [place], DO 02/27/19 [phone]

## 2019-02-27 NOTE — ED Notes (Signed)
Patient transported to X-ray 

## 2019-02-27 NOTE — ED Notes (Signed)
Pt given happy meal and ginger ale

## 2019-02-27 NOTE — Discharge Instructions (Signed)
Please read attached information. If you experience any new or worsening signs or symptoms please return to the emergency room for evaluation. Please follow-up with your primary care provider or specialist as discussed. Please use medication prescribed only as directed and discontinue taking if you have any concerning signs or symptoms.   °

## 2019-02-27 NOTE — ED Notes (Signed)
Patient verbalized understanding of discharge instructions and prescription medication and denies any further needs or questions at this time. VS stable. Patient transported home via [facility].

## 2019-03-10 ENCOUNTER — Encounter: Payer: Self-pay | Admitting: Nephrology

## 2019-03-31 ENCOUNTER — Observation Stay (HOSPITAL_COMMUNITY)
Admission: EM | Admit: 2019-03-31 | Discharge: 2019-04-02 | Disposition: A | Discharge status: Home / Self Care | Payer: Medicare Other | Attending: Internal Medicine | Admitting: Internal Medicine

## 2019-03-31 ENCOUNTER — Other Ambulatory Visit: Payer: Self-pay

## 2019-03-31 ENCOUNTER — Emergency Department (HOSPITAL_COMMUNITY): Payer: Medicare Other

## 2019-03-31 ENCOUNTER — Encounter (HOSPITAL_COMMUNITY): Payer: Self-pay | Admitting: Emergency Medicine

## 2019-03-31 DIAGNOSIS — Z8673 Personal history of transient ischemic attack (TIA), and cerebral infarction without residual deficits: Secondary | ICD-10-CM | POA: Diagnosis not present

## 2019-03-31 DIAGNOSIS — Z79899 Other long term (current) drug therapy: Secondary | ICD-10-CM | POA: Diagnosis not present

## 2019-03-31 DIAGNOSIS — F17210 Nicotine dependence, cigarettes, uncomplicated: Secondary | ICD-10-CM | POA: Insufficient documentation

## 2019-03-31 DIAGNOSIS — Z7982 Long term (current) use of aspirin: Secondary | ICD-10-CM | POA: Insufficient documentation

## 2019-03-31 DIAGNOSIS — R251 Tremor, unspecified: Secondary | ICD-10-CM | POA: Insufficient documentation

## 2019-03-31 DIAGNOSIS — R296 Repeated falls: Secondary | ICD-10-CM | POA: Diagnosis not present

## 2019-03-31 DIAGNOSIS — I5022 Chronic systolic (congestive) heart failure: Secondary | ICD-10-CM | POA: Diagnosis not present

## 2019-03-31 DIAGNOSIS — M4802 Spinal stenosis, cervical region: Secondary | ICD-10-CM | POA: Insufficient documentation

## 2019-03-31 DIAGNOSIS — I714 Abdominal aortic aneurysm, without rupture: Secondary | ICD-10-CM | POA: Insufficient documentation

## 2019-03-31 DIAGNOSIS — D649 Anemia, unspecified: Secondary | ICD-10-CM | POA: Insufficient documentation

## 2019-03-31 DIAGNOSIS — Z885 Allergy status to narcotic agent status: Secondary | ICD-10-CM | POA: Insufficient documentation

## 2019-03-31 DIAGNOSIS — Z7951 Long term (current) use of inhaled steroids: Secondary | ICD-10-CM | POA: Insufficient documentation

## 2019-03-31 DIAGNOSIS — W19XXXA Unspecified fall, initial encounter: Secondary | ICD-10-CM | POA: Diagnosis present

## 2019-03-31 DIAGNOSIS — M5116 Intervertebral disc disorders with radiculopathy, lumbar region: Secondary | ICD-10-CM | POA: Insufficient documentation

## 2019-03-31 DIAGNOSIS — Z1159 Encounter for screening for other viral diseases: Secondary | ICD-10-CM | POA: Diagnosis not present

## 2019-03-31 DIAGNOSIS — E1122 Type 2 diabetes mellitus with diabetic chronic kidney disease: Secondary | ICD-10-CM | POA: Insufficient documentation

## 2019-03-31 DIAGNOSIS — N186 End stage renal disease: Secondary | ICD-10-CM

## 2019-03-31 DIAGNOSIS — E785 Hyperlipidemia, unspecified: Secondary | ICD-10-CM | POA: Diagnosis present

## 2019-03-31 DIAGNOSIS — Z7901 Long term (current) use of anticoagulants: Secondary | ICD-10-CM | POA: Insufficient documentation

## 2019-03-31 DIAGNOSIS — I2510 Atherosclerotic heart disease of native coronary artery without angina pectoris: Secondary | ICD-10-CM | POA: Diagnosis present

## 2019-03-31 DIAGNOSIS — Z992 Dependence on renal dialysis: Secondary | ICD-10-CM | POA: Diagnosis not present

## 2019-03-31 DIAGNOSIS — Z720 Tobacco use: Secondary | ICD-10-CM | POA: Diagnosis present

## 2019-03-31 DIAGNOSIS — J449 Chronic obstructive pulmonary disease, unspecified: Secondary | ICD-10-CM | POA: Diagnosis present

## 2019-03-31 DIAGNOSIS — I10 Essential (primary) hypertension: Secondary | ICD-10-CM | POA: Diagnosis present

## 2019-03-31 DIAGNOSIS — R531 Weakness: Principal | ICD-10-CM | POA: Insufficient documentation

## 2019-03-31 DIAGNOSIS — D638 Anemia in other chronic diseases classified elsewhere: Secondary | ICD-10-CM | POA: Diagnosis present

## 2019-03-31 DIAGNOSIS — M48061 Spinal stenosis, lumbar region without neurogenic claudication: Secondary | ICD-10-CM | POA: Insufficient documentation

## 2019-03-31 DIAGNOSIS — I132 Hypertensive heart and chronic kidney disease with heart failure and with stage 5 chronic kidney disease, or end stage renal disease: Secondary | ICD-10-CM | POA: Diagnosis not present

## 2019-03-31 DIAGNOSIS — M6281 Muscle weakness (generalized): Secondary | ICD-10-CM | POA: Insufficient documentation

## 2019-03-31 DIAGNOSIS — R29898 Other symptoms and signs involving the musculoskeletal system: Secondary | ICD-10-CM | POA: Diagnosis present

## 2019-03-31 DIAGNOSIS — E1140 Type 2 diabetes mellitus with diabetic neuropathy, unspecified: Secondary | ICD-10-CM | POA: Insufficient documentation

## 2019-03-31 DIAGNOSIS — D631 Anemia in chronic kidney disease: Secondary | ICD-10-CM | POA: Insufficient documentation

## 2019-03-31 LAB — CBC WITH DIFFERENTIAL/PLATELET
Abs Immature Granulocytes: 0.02 10*3/uL (ref 0.00–0.07)
Basophils Absolute: 0 10*3/uL (ref 0.0–0.1)
Basophils Relative: 0 %
Eosinophils Absolute: 0.2 10*3/uL (ref 0.0–0.5)
Eosinophils Relative: 3 %
HCT: 36.2 % (ref 36.0–46.0)
Hemoglobin: 11.5 g/dL — ABNORMAL LOW (ref 12.0–15.0)
Immature Granulocytes: 0 %
Lymphocytes Relative: 15 %
Lymphs Abs: 0.8 10*3/uL (ref 0.7–4.0)
MCH: 33 pg (ref 26.0–34.0)
MCHC: 31.8 g/dL (ref 30.0–36.0)
MCV: 104 fL — ABNORMAL HIGH (ref 80.0–100.0)
Monocytes Absolute: 0.7 10*3/uL (ref 0.1–1.0)
Monocytes Relative: 15 %
Neutro Abs: 3.3 10*3/uL (ref 1.7–7.7)
Neutrophils Relative %: 67 %
Platelets: 157 10*3/uL (ref 150–400)
RBC: 3.48 MIL/uL — ABNORMAL LOW (ref 3.87–5.11)
RDW: 18.5 % — ABNORMAL HIGH (ref 11.5–15.5)
WBC: 4.9 10*3/uL (ref 4.0–10.5)
nRBC: 0.4 % — ABNORMAL HIGH (ref 0.0–0.2)

## 2019-03-31 LAB — BASIC METABOLIC PANEL
Anion gap: 15 (ref 5–15)
BUN: 30 mg/dL — ABNORMAL HIGH (ref 8–23)
CO2: 28 mmol/L (ref 22–32)
Calcium: 9.1 mg/dL (ref 8.9–10.3)
Chloride: 92 mmol/L — ABNORMAL LOW (ref 98–111)
Creatinine, Ser: 4.73 mg/dL — ABNORMAL HIGH (ref 0.44–1.00)
GFR calc Af Amer: 10 mL/min — ABNORMAL LOW (ref >60)
GFR calc non Af Amer: 9 mL/min — ABNORMAL LOW (ref >60)
Glucose, Bld: 104 mg/dL — ABNORMAL HIGH (ref 70–99)
Potassium: 3.6 mmol/L (ref 3.5–5.1)
Sodium: 135 mmol/L (ref 135–145)

## 2019-03-31 LAB — MAGNESIUM: Magnesium: 2.4 mg/dL (ref 1.7–2.4)

## 2019-03-31 NOTE — ED Notes (Signed)
Patient transported to MRI 

## 2019-03-31 NOTE — ED Triage Notes (Signed)
Pt BIB [facility] from dialysis center. Pt complaining of falls x3 following dialysis treatment today. Pt finished treatment today and was walking to waiting room when her legs gave out. Pt was then helped to a chair when her legs gave out twice more.

## 2019-03-31 NOTE — ED Notes (Signed)
Pt could not stand for longer than a few seconds. Said her legs were shaking and felt weak but reported no dizziness.

## 2019-03-31 NOTE — ED Provider Notes (Addendum)
[facility] EMERGENCY DEPARTMENT Provider Note   CSN: [id number] Arrival date & time: 03/31/19  1623    History   Chief Complaint Chief Complaint  Patient presents with  . Fall  . Weakness    HPI Kari Robinson is a 68 y.o. female.     The history is provided by the patient and medical records. No language interpreter was used.  Fall   Weakness   Kari Robinson is a 68 y.o. female who presents to the Emergency Department complaining of weakness, fall. She has a history of ESR D on hemodialysis, chronic hypoxic respiratory failure as well as chronic heart failure and presents to the emergency department for evaluation of progressive weakness over the last three days. She has been receiving her dialysis sessions routinely and completed a session today. She noticed that she had increased jerking in her arms and legs during the session and when she attempted to ambulate she fell, striking her knees. She was using her rolling walker while walking. She denies feeling weak in her arms or legs but states when she tries to initiate activity they seem to jerk. She denies any numbness, headache, chest pain, neck pain, back pain. She does have chronic shortness of breath and this is unchanged from her baseline. No recent medication changes. Denies any recent illnesses. Past Medical History:  Diagnosis Date  . Acute on chronic heart failure ([place])   . Acute respiratory failure with hypoxia ([place])   . Alcohol abuse   . Arthralgia   . CAD (coronary artery disease)    moderate primarily septal perforater  . CHF (congestive heart failure) ([place])   . COPD (chronic obstructive pulmonary disease) ([place])   . Depression   . HTN (hypertension)   . Hypercholesterolemia    primarily ldl-p and small particles  . RAS (renal artery stenosis) ([place]) 2002   by cath tysinger  . Renal disorder   . Smoking     Patient Active Problem List   Diagnosis Date Noted  . Transient loss of  consciousness 02/24/2019  . ESRD (end stage renal disease) ([place]) 02/24/2019  . Seizure-like activity ([place]) 02/24/2019  . NSTEMI (non-ST elevated myocardial infarction) ([place]) 02/24/2019  . Ischemic cardiomyopathy 02/24/2019  . Hypertensive emergency 09/30/2018  . Type II diabetes mellitus with renal manifestations ([place]) 09/30/2018  . HLD (hyperlipidemia) 09/30/2018  . Depression with anxiety 09/30/2018  . CKD (chronic kidney disease), stage V ([place]) 09/30/2018  . Acute on chronic respiratory failure with hypoxia ([place]) 09/30/2018  . Fluid overload 09/30/2018  . CAD (coronary artery disease) 09/30/2018  . Anemia in CKD (chronic kidney disease) 09/30/2018  . COPD (chronic obstructive pulmonary disease) ([place])   . Acute on chronic combined systolic and diastolic CHF (congestive heart failure) ([place])   . Solitary right kidney 06/24/2018  . Chronic systolic CHF (congestive heart failure) ([place]) 06/24/2018  . Acute CHF (congestive heart failure) ([place]) 06/07/2018  . Hypertensive emergency 06/07/2018  . CKD (chronic kidney disease) stage 5, GFR less than 15 ml/min ([name]) 06/07/2018  . Normocytic normochromic anemia 06/07/2018  . DM (diabetes mellitus), type 2 with renal complications ([place]) 39/76/7341  . Hypertensive urgency 06/07/2018  . Occlusion and stenosis of carotid artery without mention of cerebral infarction 01/19/2012  . RENAL ARTERY STENOSIS 09/10/2010  . ABDOMINAL BRUIT 08/12/2010  . DEPRESSION 08/11/2010  . PERIPHERAL VASCULAR DISEASE 08/11/2010  . HYPERTENSION 08/08/2010  . Coronary atherosclerosis 08/08/2010  . ARTHRALGIA 08/08/2010    Past Surgical History:  Procedure Laterality Date  . abdominal aortogram     perclose of the right femoral artery  . AV FISTULA PLACEMENT Right 07/11/2018   Procedure: Right arm ARTERIOVENOUS FISTULA CREATION;  Surgeon: [name], MD;  Location: [facility];  Service: Vascular;  Laterality: Right;  . AVF placement Right   . CARDIAC  CATHETERIZATION     left heart catheterization.  Coronary cineangiography. Lft ventricular cineangiography.    [name] LUNG SURGERY     due to lung cancer per patient's report  . TUBAL LIGATION       OB History   No obstetric history on file.      Home Medications    Prior to Admission medications   Medication Sig Start Date End Date Taking? Authorizing Provider  albuterol (PROVENTIL HFA;VENTOLIN HFA) 108 (90 Base) MCG/ACT inhaler Inhale 2 puffs into the lungs every 4 (four) hours as needed for wheezing.  11/25/17   [provider]  albuterol (PROVENTIL) (2.5 MG/3ML) 0.083% nebulizer solution Take 2.5 mg by nebulization every 6 (six) hours as needed for wheezing or shortness of breath.  07/28/18   [provider]  aspirin EC 81 MG tablet Take 81 mg by mouth daily.    [provider]  atorvastatin (LIPITOR) 40 MG tablet Take 40 mg by mouth daily.    [provider]  b complex-vitamin c-folic acid (NEPHRO-VITE) 0.8 MG TABS tablet Take 1 tablet by mouth See admin instructions. Take one tablet by mouth on Sunday, Tuesday, Thursday, Saturday mornings. Take one tablet after dialysis on Monday, Wednesday, Friday 02/06/19   [provider]  budesonide-formoterol (SYMBICORT) 160-4.5 MCG/ACT inhaler Inhale 2 puffs into the lungs 2 (two) times daily.    [provider]  clopidogrel (PLAVIX) 75 MG tablet Take 75 mg by mouth daily.    [provider]  diclofenac sodium (VOLTAREN) 1 % GEL Apply 2 g topically 4 (four) times daily. 02/27/19   Hedges, Dellis Filbert, PA-C  hydrALAZINE (APRESOLINE) 100 MG tablet Take 1 tablet (100 mg total) by mouth 3 (three) times daily. Patient not taking: Reported on 02/25/2019 10/01/18 02/24/19  Purohit, Konrad Dolores, MD  HYDROcodone-acetaminophen (NORCO/VICODIN) 5-325 MG tablet Take 1 tablet by mouth every 6 (six) hours as needed for up to 12 doses for severe pain. 02/26/19   Antonieta Pert, MD  ipratropium-albuterol (DUONEB) 0.5-2.5  (3) MG/3ML SOLN Take 3 mLs by nebulization every 6 (six) hours as needed. Patient not taking: Reported on 02/25/2019 06/10/18   Dana Allan I, MD  isosorbide mononitrate (IMDUR) 30 MG 24 hr tablet Take 30 mg by mouth at bedtime.     [provider]  OXYGEN Inhale 2 L into the lungs continuous.    [provider]  sodium bicarbonate 650 MG tablet Take 1 tablet (650 mg total) by mouth 2 (two) times daily. Patient not taking: Reported on 02/25/2019 06/09/18   Dana Allan I, MD  torsemide (DEMADEX) 20 MG tablet Take 2 tablets (40 mg total) by mouth daily. Patient not taking: Reported on 02/25/2019 06/10/18   Bonnell Public, MD    Family History Family History  Problem Relation Age of Onset  . Emphysema Father     Social History Social History   Tobacco Use  . Smoking status: Current Every Day Smoker    Years: 35.00    Types: Cigarettes  . Smokeless tobacco: Never Used  . Tobacco comment: 3-4 cigarettes per day  Substance Use Topics  . Alcohol use: Never  Frequency: Never    Comment: states has quit drinking "a few months ago"  . Drug use: Never     Allergies   Citalopram hydrobromide and Morphine and related   Review of Systems Review of Systems  Neurological: Positive for weakness.  All other systems reviewed and are negative.    Physical Exam Updated Vital Signs BP (!) 151/109   Pulse 98   Temp 98.3 F (36.8 C) (Oral)   Resp (!) 25   Ht '5\' 4"'  (1.626 m)   Wt 64.9 kg   SpO2 94%   BMI 24.55 kg/m   Physical Exam Vitals signs and nursing note reviewed.  Constitutional:      Appearance: She is well-developed.  HENT:     Head: Normocephalic and atraumatic.  Neck:     Musculoskeletal: Neck supple.  Cardiovascular:     Rate and Rhythm: Normal rate and regular rhythm.  Pulmonary:     Effort: Pulmonary effort is normal. No respiratory distress.  Abdominal:     Palpations: Abdomen is soft.     Tenderness: There is no abdominal  tenderness. There is no guarding or rebound.  Musculoskeletal:        General: No swelling or tenderness.     Comments: 2+ DP pulses bilaterally  Skin:    General: Skin is warm and dry.     Capillary Refill: Capillary refill takes less than 2 seconds.  Neurological:     Mental Status: She is alert and oriented to person, place, and time.     Comments: 4/5 strength in BLE, 5/5 strength in BUE.  Sensation to light touch intact in all four extremities.  Slight tremor on effort in all four extremities, legs greater than arms.    Psychiatric:        Mood and Affect: Mood normal.        Behavior: Behavior normal.      ED Treatments / Results  Labs (all labs ordered are listed, but only abnormal results are displayed) Labs Reviewed  BASIC METABOLIC PANEL - Abnormal; Notable for the following components:      Result Value   Chloride 92 (*)    Glucose, Bld 104 (*)    BUN 30 (*)    Creatinine, Ser 4.73 (*)    GFR calc non Af Amer 9 (*)    GFR calc Af Amer 10 (*)    All other components within normal limits  CBC WITH DIFFERENTIAL/PLATELET - Abnormal; Notable for the following components:   RBC 3.48 (*)    Hemoglobin 11.5 (*)    MCV 104.0 (*)    RDW 18.5 (*)    nRBC 0.4 (*)    All other components within normal limits  MAGNESIUM    EKG EKG Interpretation  Date/Time:  Friday Mar 31 2019 16:33:09 EDT Ventricular Rate:  98 PR Interval:    QRS Duration: 99 QT Interval:  364 QTC Calculation: 465 R Axis:   62 Text Interpretation:  Sinus tachycardia Multiform ventricular premature complexes LVH with secondary repolarization abnormality ST depr, consider ischemia, inferior leads Artifact Confirmed by Quintella Reichert [phone]) on 03/31/2019 4:43:53 PM   Radiology Mr Cervical Spine Wo Contrast  Result Date: 03/31/2019 CLINICAL DATA:  Initial evaluation for progressive extremity weakness with jerking for 3 days. EXAM: MRI CERVICAL SPINE WITHOUT CONTRAST TECHNIQUE: Multiplanar,  multisequence MR imaging of the cervical spine was performed. No intravenous contrast was administered. COMPARISON:  Prior CT from 02/24/2019. FINDINGS: Alignment: Mild straightening of the  normal cervical lordosis. No listhesis. Vertebrae: Vertebral body heights maintained without evidence for acute or chronic fracture. Bone marrow signal intensity within normal limits. No discrete or worrisome osseous lesions. No abnormal marrow edema. Cord: Signal intensity within the cervical spinal cord is normal. Posterior Fossa, vertebral arteries, paraspinal tissues: Visualized brain and posterior fossa demonstrates no acute finding. Craniocervical junction normal. Paraspinous and prevertebral soft tissues within normal limits. Loss of normal flow void within the hypoplastic left vertebral artery, which could be related to slow flow and/or occlusion. Normal flow void seen within the dominant right vertebral artery. Disc levels: C2-C3: Mild disc bulge with uncovertebral hypertrophy. No significant canal or foraminal stenosis. C3-C4: Diffuse disc bulge with uncovertebral hypertrophy. Flattening of the ventral thecal sac without significant spinal stenosis. Foramina remain patent. C4-C5: Circumferential disc osteophyte with intervertebral disc space narrowing. Broad posterior component flattens and effaces the ventral thecal sac. Resultant mild to moderate spinal stenosis without cord deformity. Severe bilateral C5 foraminal narrowing, left greater than right. C5-C6: Circumferential disc osteophyte with intervertebral disc space narrowing. Flattening and partial effacement of the ventral thecal sac with resultant mild to moderate spinal stenosis. No cord deformity. Severe left with moderate right C6 foraminal stenosis. C6-C7: Circumferential disc osteophyte with intervertebral disc space narrowing. Broad posterior component, asymmetric to the left, flattens and partially indents the ventral thecal sac, greater on the left. Mild  to moderate spinal stenosis without cord deformity. Moderate bilateral C7 foraminal stenosis. C7-T1:  Mild facet hypertrophy.  No stenosis. Visualized upper thoracic spine demonstrates no significant finding. IMPRESSION: 1. No acute abnormality within the cervical spine. Normal MRI appearance of the cervical spinal cord without cord impingement. 2. 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 3. Multifactorial degenerative changes with resultant multilevel foraminal narrowing as above. Notable findings include severe bilateral C5 foraminal stenosis, severe left with moderate right C6 foraminal narrowing, with moderate bilateral C7 foraminal stenosis. 4. Loss of normal flow void within the diminutive left vertebral artery, which could be related to slow flow and/or occlusion. Electronically Signed   By: Jeannine Boga M.D.   On: 03/31/2019 19:50   Dg Chest Port 1 View  Result Date: 03/31/2019 CLINICAL DATA:  Weakness EXAM: PORTABLE CHEST 1 VIEW COMPARISON:  02/27/2000 FINDINGS: The heart size and mediastinal contours are within normal limits. Unchanged elevation of the left hemidiaphragm. The visualized skeletal structures are unremarkable. IMPRESSION: No acute abnormality of the lungs in AP portable projection. Unchanged elevation of the left hemidiaphragm. Electronically Signed   By: Eddie Candle M.D.   On: 03/31/2019 17:56    Procedures Procedures (including critical care time)  Medications Ordered in ED Medications - No data to display   Initial Impression / Assessment and Plan / ED Course  I have reviewed the triage vital signs and the nursing notes.  Pertinent labs & imaging results that were available during my care of the patient were reviewed by me and considered in my medical decision making (see chart for details).        Patient here for evaluation of multiple falls. She is generally weak on examination. MRI of her  cervical spine does show some degenerative changes as well as some mild cervical stenosis but is not consistent with her examination. Neurology consulted for recommendations given her symptoms. Pt unable to stand or ambulate.  Medicine consulted for admission.    Final Clinical Impressions(s) / ED Diagnoses   Final diagnoses:  None  ED Discharge Orders    None       Quintella Reichert, MD 03/31/19 2329    Quintella Reichert, MD 04/01/19 [phone]

## 2019-04-01 ENCOUNTER — Encounter (HOSPITAL_COMMUNITY): Payer: Self-pay | Admitting: Internal Medicine

## 2019-04-01 ENCOUNTER — Observation Stay (HOSPITAL_COMMUNITY): Payer: Medicare Other

## 2019-04-01 DIAGNOSIS — R29898 Other symptoms and signs involving the musculoskeletal system: Secondary | ICD-10-CM | POA: Diagnosis not present

## 2019-04-01 DIAGNOSIS — Z720 Tobacco use: Secondary | ICD-10-CM

## 2019-04-01 DIAGNOSIS — W19XXXA Unspecified fall, initial encounter: Secondary | ICD-10-CM | POA: Diagnosis not present

## 2019-04-01 DIAGNOSIS — E785 Hyperlipidemia, unspecified: Secondary | ICD-10-CM | POA: Diagnosis not present

## 2019-04-01 DIAGNOSIS — I5022 Chronic systolic (congestive) heart failure: Secondary | ICD-10-CM

## 2019-04-01 DIAGNOSIS — J449 Chronic obstructive pulmonary disease, unspecified: Secondary | ICD-10-CM | POA: Diagnosis not present

## 2019-04-01 DIAGNOSIS — F1010 Alcohol abuse, uncomplicated: Secondary | ICD-10-CM | POA: Insufficient documentation

## 2019-04-01 DIAGNOSIS — R531 Weakness: Secondary | ICD-10-CM | POA: Diagnosis not present

## 2019-04-01 LAB — LIPID PANEL
Cholesterol: 125 mg/dL (ref 0–200)
HDL: 51 mg/dL (ref >40)
LDL Cholesterol: 59 mg/dL (ref 0–99)
Total CHOL/HDL Ratio: 2.5 RATIO
Triglycerides: 76 mg/dL (ref <150)
VLDL: 15 mg/dL (ref 0–40)

## 2019-04-01 LAB — SARS CORONAVIRUS 2 BY RT PCR (HOSPITAL ORDER, PERFORMED IN ~~LOC~~ HOSPITAL LAB): SARS Coronavirus 2: NEGATIVE

## 2019-04-01 LAB — VITAMIN B12: Vitamin B-12: 535 pg/mL (ref 180–914)

## 2019-04-01 LAB — GLUCOSE, CAPILLARY: Glucose-Capillary: 107 mg/dL — ABNORMAL HIGH (ref 70–99)

## 2019-04-01 LAB — HEMOGLOBIN A1C
Hgb A1c MFr Bld: 4.9 % (ref 4.8–5.6)
Mean Plasma Glucose: 93.93 mg/dL

## 2019-04-01 LAB — TSH: TSH: 0.749 u[IU]/mL (ref 0.350–4.500)

## 2019-04-01 MED ORDER — SODIUM BICARBONATE 650 MG PO TABS
650.0000 mg | ORAL_TABLET | Freq: Two times a day (BID) | ORAL | Status: DC
  Administered 2019-04-01 – 2019-04-02 (×4): 650 mg via ORAL
  Filled 2019-04-01 (×4): qty 1

## 2019-04-01 MED ORDER — ATORVASTATIN CALCIUM 40 MG PO TABS
40.0000 mg | ORAL_TABLET | Freq: Every day | ORAL | Status: DC
  Administered 2019-04-01 – 2019-04-02 (×2): 40 mg via ORAL
  Filled 2019-04-01 (×2): qty 1

## 2019-04-01 MED ORDER — ISOSORBIDE MONONITRATE ER 30 MG PO TB24: 30.0000 mg | ORAL_TABLET | Freq: Every day | ORAL | Status: DC

## 2019-04-01 MED ORDER — RENA-VITE PO TABS
1.0000 | ORAL_TABLET | Freq: Every day | ORAL | Status: DC
  Administered 2019-04-01: 22:00:00 1 via ORAL
  Filled 2019-04-01: qty 1

## 2019-04-01 MED ORDER — SENNOSIDES-DOCUSATE SODIUM 8.6-50 MG PO TABS: 1.0000 | ORAL_TABLET | Freq: Every evening | ORAL | Status: DC | PRN

## 2019-04-01 MED ORDER — STROKE: EARLY STAGES OF RECOVERY BOOK
Freq: Once | Status: AC
  Administered 2019-04-01: 02:00:00

## 2019-04-01 MED ORDER — IPRATROPIUM-ALBUTEROL 0.5-2.5 (3) MG/3ML IN SOLN: 3.0000 mL | Freq: Two times a day (BID) | RESPIRATORY_TRACT | Status: DC

## 2019-04-01 MED ORDER — ASPIRIN EC 81 MG PO TBEC
81.0000 mg | DELAYED_RELEASE_TABLET | Freq: Every day | ORAL | Status: DC
  Administered 2019-04-01 – 2019-04-02 (×2): 81 mg via ORAL
  Filled 2019-04-01 (×2): qty 1

## 2019-04-01 MED ORDER — HYDRALAZINE HCL 50 MG PO TABS
50.0000 mg | ORAL_TABLET | Freq: Three times a day (TID) | ORAL | Status: DC
  Administered 2019-04-01 – 2019-04-02 (×2): 50 mg via ORAL
  Filled 2019-04-01 (×3): qty 1

## 2019-04-01 MED ORDER — ACETAMINOPHEN 650 MG RE SUPP: 650.0000 mg | RECTAL | Status: DC | PRN

## 2019-04-01 MED ORDER — ACETAMINOPHEN 325 MG PO TABS: 650.0000 mg | ORAL_TABLET | ORAL | Status: DC | PRN

## 2019-04-01 MED ORDER — NON FORMULARY: 6.0000 mg | Freq: Every evening | Status: DC | PRN

## 2019-04-01 MED ORDER — ALBUTEROL SULFATE (2.5 MG/3ML) 0.083% IN NEBU: 3.0000 mL | INHALATION_SOLUTION | RESPIRATORY_TRACT | Status: DC | PRN

## 2019-04-01 MED ORDER — MOMETASONE FURO-FORMOTEROL FUM 200-5 MCG/ACT IN AERO
2.0000 | INHALATION_SPRAY | Freq: Two times a day (BID) | RESPIRATORY_TRACT | Status: DC
  Administered 2019-04-01 – 2019-04-02 (×2): 2 via RESPIRATORY_TRACT
  Filled 2019-04-01: qty 8.8

## 2019-04-01 MED ORDER — VITAMIN B-1 100 MG PO TABS
100.0000 mg | ORAL_TABLET | Freq: Every day | ORAL | Status: DC
  Administered 2019-04-01 – 2019-04-02 (×2): 100 mg via ORAL
  Filled 2019-04-01 (×2): qty 1

## 2019-04-01 MED ORDER — IPRATROPIUM-ALBUTEROL 0.5-2.5 (3) MG/3ML IN SOLN
3.0000 mL | Freq: Three times a day (TID) | RESPIRATORY_TRACT | Status: DC
  Administered 2019-04-01 – 2019-04-02 (×2): 3 mL via RESPIRATORY_TRACT
  Filled 2019-04-01 (×4): qty 3

## 2019-04-01 MED ORDER — NICOTINE 21 MG/24HR TD PT24
21.0000 mg | MEDICATED_PATCH | Freq: Every day | TRANSDERMAL | Status: DC
  Administered 2019-04-01 – 2019-04-02 (×2): 21 mg via TRANSDERMAL
  Filled 2019-04-01 (×2): qty 1

## 2019-04-01 MED ORDER — MELATONIN 3 MG PO TABS
6.0000 mg | ORAL_TABLET | Freq: Every evening | ORAL | Status: DC | PRN
  Administered 2019-04-01: 6 mg via ORAL
  Filled 2019-04-01 (×2): qty 2

## 2019-04-01 MED ORDER — ACETAMINOPHEN 160 MG/5ML PO SOLN: 650.0000 mg | ORAL | Status: DC | PRN

## 2019-04-01 MED ORDER — FOLIC ACID 1 MG PO TABS
1.0000 mg | ORAL_TABLET | Freq: Every day | ORAL | Status: DC
  Administered 2019-04-01 – 2019-04-02 (×2): 1 mg via ORAL
  Filled 2019-04-01 (×2): qty 1

## 2019-04-01 MED ORDER — HYDROCODONE-ACETAMINOPHEN 5-325 MG PO TABS
1.0000 | ORAL_TABLET | Freq: Four times a day (QID) | ORAL | Status: DC | PRN
  Filled 2019-04-01: qty 1

## 2019-04-01 MED ORDER — IPRATROPIUM-ALBUTEROL 0.5-2.5 (3) MG/3ML IN SOLN
3.0000 mL | Freq: Four times a day (QID) | RESPIRATORY_TRACT | Status: DC
  Administered 2019-04-01: 04:00:00 3 mL via RESPIRATORY_TRACT
  Filled 2019-04-01: qty 3

## 2019-04-01 MED ORDER — CLOPIDOGREL BISULFATE 75 MG PO TABS
75.0000 mg | ORAL_TABLET | Freq: Every day | ORAL | Status: DC
  Administered 2019-04-01 – 2019-04-02 (×2): 75 mg via ORAL
  Filled 2019-04-01 (×2): qty 1

## 2019-04-01 MED ORDER — HEPARIN SODIUM (PORCINE) 5000 UNIT/ML IJ SOLN
5000.0000 [IU] | Freq: Three times a day (TID) | INTRAMUSCULAR | Status: DC
  Administered 2019-04-01 – 2019-04-02 (×4): 5000 [IU] via SUBCUTANEOUS
  Filled 2019-04-01 (×4): qty 1

## 2019-04-01 NOTE — Progress Notes (Signed)
Patient off the floor for MRI
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
## 2019-04-01 NOTE — Progress Notes (Signed)
Attempted to get pt for MRI, RN states pt does not want to do it right now wants to sleep and will do exam in the later morning. We will follow up on the next shift.

## 2019-04-01 NOTE — Progress Notes (Signed)
OT Cancellation Note  Patient Details Name: Kari Robinson MRN: [id number] DOB: 1951-05-16   Cancelled Treatment:    Reason Eval/Treat Not Completed: Patient at procedure or test/ unavailable.  Pt working with SLP.  Will reattempt.  Lucille Passy, OTR/L [place] Pager [phone] Office [phone]   Lucille Passy M 04/01/2019, 11:50 AM

## 2019-04-01 NOTE — Progress Notes (Addendum)
NEUROLOGY Progress Note  Chief Complaint: Tremors/jerking movements   Interval History:                                                                                                               Patient tells me that she first noticed tremor about 3 months ago, before cardiac arrest. Per EMR, on 4/10 patient was admitted after she had seizure-like activity after coming back from dialysis, then became unresponsive with no pulse found requiring CPR. Since discharge, she feels these tremors/jerking movements have gotten worse. On exam there is no tremor/jerking movements at rest. With movement there is a shaking/tremor, no TD type or myoclonic movements seen. She is distractible with the action movements.  Past Medical History:  Diagnosis Date  . Acute on chronic heart failure ([place])   . Acute respiratory failure with hypoxia ([place])   . Alcohol abuse   . Arthralgia   . CAD (coronary artery disease)    moderate primarily septal perforater  . CHF (congestive heart failure) ([place])   . COPD (chronic obstructive pulmonary disease) ([place])   . Depression   . HTN (hypertension)   . Hypercholesterolemia    primarily ldl-p and small particles  . RAS (renal artery stenosis) ([place]) 2002   by cath tysinger  . Renal disorder   . Smoking     Past Surgical History:  Procedure Laterality Date  . abdominal aortogram     perclose of the right femoral artery  . AV FISTULA PLACEMENT Right 07/11/2018   Procedure: Right arm ARTERIOVENOUS FISTULA CREATION;  Surgeon: [name], MD;  Location: [facility];  Service: Vascular;  Laterality: Right;  . AVF placement Right   . CARDIAC CATHETERIZATION     left heart catheterization.  Coronary cineangiography. Lft ventricular cineangiography.    [name] LUNG SURGERY     due to lung cancer per patient's report  . TUBAL LIGATION      Family History  Problem Relation Age of Onset  . Emphysema Father    Social History:  reports that she has been smoking cigarettes. She  has smoked for the past 35.00 years. She has never used smokeless tobacco. She reports that she does not drink alcohol or use drugs.  Allergies:  Allergies  Allergen Reactions  . Citalopram Hydrobromide Hives  . Morphine And Related Itching    Medications:  I reviewed home medications   ROS:                                                                                                                                   14 systems reviewed and negative except above    Examination:                                                                                                    General: Appears well-developed and well-nourished.  Psych: Affect appropriate to situation Eyes: No scleral injection HENT: No OP obstrucion Head: Normocephalic.  Cardiovascular: Normal rate and regular rhythm.  Respiratory: Effort normal and breath sounds normal to anterior ascultation GI: Soft.  No distension. There is no tenderness.  Skin: WDI   Neurological Examination Mental Status: Alert, oriented, thought content appropriate.  Speech fluent without evidence of aphasia.  Able to follow 3 step commands without difficulty. Cranial Nerves: II: visual fields grossly normal, pupils equal, round, reactive to light and accommodation III,IV, VI: ptosis not present, extraocular muscles extra-ocular motions intact bilaterally V,VII: smile symmetric, facial light touch sensation normal bilaterally VIII: hearing normal bilaterally IX,X: gag reflex present XI: trapezius strength/neck flexion strength normal bilaterally XII: tongue strength normal  Motor: there is no tremor/jerking movements at rest. With movement there is a shaking/tremor, no TD type or myoclonic movements seen. She is distractible with the action movements. Right : Upper extremity    Left:     Upper extremity 5/5 deltoid        5/5 deltoid 5/5 tricep      5/5 tricep 5/5 biceps      5/5 biceps  5/5wrist flexion     5/5 wrist flexion 5/5 wrist extension     5/5 wrist extension 5/5 hand grip      5/5 hand grip  Lower extremity     Lower extremity 4+/5 hip flexor      4+/5 hip flexor 5/5 hip adductors     5/5 hip adductors 4/5 hip abductors     4/5 hip abductors 4/5 quadricep                 4/5 quadriceps  5/5 hamstrings     5/5 hamstrings 5/5 plantar flexion       5/5 plantar flexion 4/5 plantar extension     4/5 plantar extension Tone and bulk:normal tone throughout; mild atrophy of bilateral thighs Sensory: Reduced sensation to light touch, temperature, pinprick over both feet and lower legs in stocking distribution,  vibration intact. Deep Tendon Reflexes:  Right: Upper Extremity   Left: Upper extremity   biceps (C-5 to C-6) 2/4   biceps (C-5 to C-6) 2/4 tricep (C7) 2/4    triceps (C7) 2/4 Brachioradialis (C6) 2/4  Brachioradialis (C6) 2/4  Lower Extremity Lower Extremity  quadriceps (L-2 to L-4) Tr/4   quadriceps (L-2 to L-4) tr/4 Achilles (S1) 0/4   Achilles (S1) 0/4 Plantars: Right: downgoing   Left: downgoing Cerebellar: normal finger-to-nose, difficulty with b/l heel to shin  GAIT: I could not stand her alone to walk her to assess gait. Will ask PT to report back   Lab Results: Basic Metabolic Panel: Recent Labs  Lab 03/31/19 1700  NA 135  K 3.6  CL 92*  CO2 28  GLUCOSE 104*  BUN 30*  CREATININE 4.73*  CALCIUM 9.1  MG 2.4    CBC: Recent Labs  Lab 03/31/19 1700  WBC 4.9  NEUTROABS 3.3  HGB 11.5*  HCT 36.2  MCV 104.0*  PLT 157    Coagulation Studies: No results for input(s): LABPROT, INR in the last 72 hours.  Imaging: Mr Cervical Spine Wo Contrast  Result Date: 03/31/2019 CLINICAL DATA:  Initial evaluation for progressive extremity weakness with jerking for 3 days. EXAM: MRI CERVICAL SPINE WITHOUT CONTRAST TECHNIQUE: Multiplanar, multisequence MR imaging of the  cervical spine was performed. No intravenous contrast was administered. COMPARISON:  Prior CT from 02/24/2019. FINDINGS: Alignment: Mild straightening of the normal cervical lordosis. No listhesis. Vertebrae: Vertebral body heights maintained without evidence for acute or chronic fracture. Bone marrow signal intensity within normal limits. No discrete or worrisome osseous lesions. No abnormal marrow edema. Cord: Signal intensity within the cervical spinal cord is normal. Posterior Fossa, vertebral arteries, paraspinal tissues: Visualized brain and posterior fossa demonstrates no acute finding. Craniocervical junction normal. Paraspinous and prevertebral soft tissues within normal limits. Loss of normal flow void within the hypoplastic left vertebral artery, which could be related to slow flow and/or occlusion. Normal flow void seen within the dominant right vertebral artery. Disc levels: C2-C3: Mild disc bulge with uncovertebral hypertrophy. No significant canal or foraminal stenosis. C3-C4: Diffuse disc bulge with uncovertebral hypertrophy. Flattening of the ventral thecal sac without significant spinal stenosis. Foramina remain patent. C4-C5: Circumferential disc osteophyte with intervertebral disc space narrowing. Broad posterior component flattens and effaces the ventral thecal sac. Resultant mild to moderate spinal stenosis without cord deformity. Severe bilateral C5 foraminal narrowing, left greater than right. C5-C6: Circumferential disc osteophyte with intervertebral disc space narrowing. Flattening and partial effacement of the ventral thecal sac with resultant mild to moderate spinal stenosis. No cord deformity. Severe left with moderate right C6 foraminal stenosis. C6-C7: Circumferential disc osteophyte with intervertebral disc space narrowing. Broad posterior component, asymmetric to the left, flattens and partially indents the ventral thecal sac, greater on the left. Mild to moderate spinal stenosis  without cord deformity. Moderate bilateral C7 foraminal stenosis. C7-T1:  Mild facet hypertrophy.  No stenosis. Visualized upper thoracic spine demonstrates no significant finding. IMPRESSION: 1. No acute abnormality within the cervical spine. Normal MRI appearance of the cervical spinal cord without cord impingement. 2. 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 3. Multifactorial degenerative changes with resultant multilevel foraminal narrowing as above. Notable findings include severe bilateral C5 foraminal stenosis, severe left with moderate right C6 foraminal narrowing, with moderate bilateral C7 foraminal stenosis. 4. Loss of normal flow void within the diminutive left vertebral artery, which could be related  to slow flow and/or occlusion. Electronically Signed   By: Jeannine Boga M.D.   On: 03/31/2019 19:50   Dg Chest Port 1 View  Result Date: 03/31/2019 CLINICAL DATA:  Weakness EXAM: PORTABLE CHEST 1 VIEW COMPARISON:  02/27/2000 FINDINGS: The heart size and mediastinal contours are within normal limits. Unchanged elevation of the left hemidiaphragm. The visualized skeletal structures are unremarkable. IMPRESSION: No acute abnormality of the lungs in AP portable projection. Unchanged elevation of the left hemidiaphragm. Electronically Signed   By: Eddie Candle M.D.   On: 03/31/2019 17:56   I have reviewed the above imaging; MR brain and MRI of C spine    ASSESSMENT AND PLAN  68 y.o. female with past medical history of stroke, hypertension, hyperlipidemia, COPD, CHF, coronary artery disease, alcohol abuse, end-stage renal disease on dialysis presents to the ED with progressive bilateral leg weakness and jerking movements on initiation in both her legs. This has lead to unsteady gait and falls, which lead to this admission.   # Abnormal movements/abnormal gait: This is unusual presentation, patient has no myoclonus or dyskinesia on  exam. At rest there is no abnormal movements. When asked to preform motor testing, she immediately begins to tremor in both legs. None see in upper extremities even while testing strength.  -Her labs are not suggestive of significant uremia, electrolytes are within normal limits.   -She is not hypercarbic  -She is not on any antipsychotic agents or medications with extrapyramidal effects.   -She states this was present Before cardiac arrest, but has gradually gotten worse since then, therefore Berneda Rose or other post anoxic causes unlikely, but could exacerbate previous existing issues.   -Also on the ddx d/t her many years of heavy ETOH abuse- must consider dietary/vitiman deficiencies as well as alcoholic cerebellar degeneration. This can cause progressive unsteadiness when walking over weeks/months.  # Polyneuropathy- patient does have diminished reflexes over patella and ankles compared to upper extremities which are normal.  She does have some weakness in both legs and  sensory neuropathy in stocking distribution. ddx for this could be due to uremia or ETOH abuse for many years  # ETOH abuse-  (only sober for 32mo per pt report). Add Folate/Thiamin to daily multivit. See above for consideration of cerebellar degeneration syndrome. Con't abstinence from all ETOH and con't nutritional support.   Recommendations MRI brain/lumbar spine pending- we will f/u Folate/Thiamin PO daily added Serum TSH, B12, Cu ordered; pending  Desiree Metzger-Cihelka, ARNP-C, ANVP-BC [facility]   I have seen the patient and reviewed the above note.  Though there may be some other contributing factors, her lower extremity tremor is clearly completely distractible consistent with a psychogenic process.  I distract her both by verbal cues as well as distracting maneuvers with her hands and both cause her lower extremity symptoms to subside.  I discussed with her the possibility that her symptoms are  related to stress, and she indicates that she is undergoing an extremely stressful time and is accepting of this possibility.  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  Follow-up and treatment of TSH, B12, copper per internal medicine.  Roland Rack, MD [facility] [phone]  If 7pm- 7am, please page neurology on call as listed in [place].

## 2019-04-01 NOTE — TOC Initial Note (Signed)
Transition of Care [facility]) - Initial/Assessment Note    Patient Details  Name: Kari Robinson MRN: [id number] Date of Birth: 04/03/1951  Transition of [facility]) CM/SW Contact:    Carles Collet, RN Phone Number: 04/01/2019, 11:20 AM  Clinical Narrative:                 Damaris Schooner w patient, discussed dispo plan. She states that she has rollator at home since 2017, discussed she may have to pay for RW but one can be ordered and delivered to her room prior to DC, she would like to porceded with one being delivered to room. Referral made to [facility]. Discussed outpatient PT centers are closed due to [place] and [place] is option. We discussed Medicare ratings and she would like t ouse [facility], referral made to [facility], left sticky note for MD to place order.  NEEDS: HH order for PT  Expected Discharge Plan: [place] Barriers to Discharge: Continued Medical Work up   Patient Goals and CMS Choice Patient states their goals for this hospitalization and ongoing recovery are:: to return home CMS Medicare.gov Compare Post Acute Care list provided to:: Patient Choice offered to / list presented to : Patient  Expected Discharge Plan and Services Expected Discharge Plan: [place]   Discharge Planning Services: CM Consult Post Acute Care Choice: Home Health, Durable Medical Equipment   Expected Discharge Date: 04/03/19               DME Arranged: Gilford Rile rolling DME Agency: [employer] Date DME Agency Contacted: 04/01/19 Time DME Agency Contacted: 1 Representative spoke with at DME Agency: [place]: PT [place]: [place] ([place]) Date [place]: 04/01/19 Time [place]: 42 Representative spoke with at [place]: [place] Arrangements/Services     Patient language and need for interpreter reviewed:: Yes Do you feel safe going back to the place where you live?: Yes            Criminal Activity/Legal  Involvement Pertinent to Current Situation/Hospitalization: No - Comment as needed  Activities of Daily Living Home Assistive Devices/Equipment: Eyeglasses, Environmental consultant (specify type) ADL Screening (condition at time of admission) Patient's cognitive ability adequate to safely complete daily activities?: Yes Is the patient deaf or have difficulty hearing?: No Does the patient have difficulty seeing, even when wearing glasses/contacts?: No Does the patient have difficulty concentrating, remembering, or making decisions?: Yes Patient able to express need for assistance with ADLs?: Yes Does the patient have difficulty dressing or bathing?: Yes Independently performs ADLs?: No Communication: Independent Dressing (OT): Needs assistance Is this a change from baseline?: Pre-admission baseline Grooming: Needs assistance Is this a change from baseline?: Pre-admission baseline Feeding: Independent Bathing: Needs assistance Is this a change from baseline?: Pre-admission baseline Toileting: Needs assistance Is this a change from baseline?: Change from baseline, expected to last <3 days In/Out Bed: Needs assistance Is this a change from baseline?: Change from baseline, expected to last >3 days Walks in Home: Needs assistance Is this a change from baseline?: Pre-admission baseline Does the patient have difficulty walking or climbing stairs?: No Weakness of Legs: Both Weakness of Arms/Hands: None  Permission Sought/Granted                  Emotional Assessment              Admission diagnosis:  weakness Patient Active Problem List   Diagnosis Date Noted  .  Fall 04/01/2019  . Leg weakness, bilateral 04/01/2019  . Tobacco abuse 04/01/2019  . Alcohol abuse 04/01/2019  . Transient loss of consciousness 02/24/2019  . ESRD (end stage renal disease) ([place]) 02/24/2019  . Seizure-like activity ([place]) 02/24/2019  . NSTEMI (non-ST elevated myocardial infarction) ([place]) 02/24/2019  . Ischemic  cardiomyopathy 02/24/2019  . Hypertensive emergency 09/30/2018  . Type II diabetes mellitus with renal manifestations ([place]) 09/30/2018  . HLD (hyperlipidemia) 09/30/2018  . Depression with anxiety 09/30/2018  . CKD (chronic kidney disease), stage V ([place]) 09/30/2018  . Acute on chronic respiratory failure with hypoxia ([place]) 09/30/2018  . Fluid overload 09/30/2018  . CAD (coronary artery disease) 09/30/2018  . Anemia in CKD (chronic kidney disease) 09/30/2018  . COPD (chronic obstructive pulmonary disease) ([place])   . Acute on chronic combined systolic and diastolic CHF (congestive heart failure) ([place])   . Solitary right kidney 06/24/2018  . Chronic systolic CHF (congestive heart failure) ([place]) 06/24/2018  . Acute CHF (congestive heart failure) ([place]) 06/07/2018  . Hypertensive emergency 06/07/2018  . CKD (chronic kidney disease) stage 5, GFR less than 15 ml/min ([name]) 06/07/2018  . Normocytic normochromic anemia 06/07/2018  . DM (diabetes mellitus), type 2 with renal complications ([place]) 47/82/9562  . Hypertensive urgency 06/07/2018  . Occlusion and stenosis of carotid artery without mention of cerebral infarction 01/19/2012  . RENAL ARTERY STENOSIS 09/10/2010  . ABDOMINAL BRUIT 08/12/2010  . DEPRESSION 08/11/2010  . PERIPHERAL VASCULAR DISEASE 08/11/2010  . Essential hypertension 08/08/2010  . Coronary atherosclerosis 08/08/2010  . ARTHRALGIA 08/08/2010   PCP:  Patient, [name] Pharmacy:   [facility], [place], [address], [place] [address] Phone: [phone] Fax: [phone]  [facility] [phone] [place], [place] [address] [place] [address] Phone: [phone] Fax: [phone]     Social Determinants of Health ([place]) Interventions    Readmission Risk Interventions No flowsheet data found.
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
## 2019-04-01 NOTE — Consult Note (Addendum)
Requesting Physician: Dr. Ralene Bathe    Chief Complaint: Tremors/jerking movements in both months  History obtained from: Patient and Chart    HPI:                                                                                                                                       Kari Robinson is an 68 y.o. female with past medical history of hypertension, hyperlipidemia, COPD, CHF, coronary artery disease, alcohol abuse, end-stage renal disease on dialysis presents to the ED with progressive jerking movements in both her legs.  Patient states that about 3 months ago she would notice occasional jerks of her body that has been progressively getting worse.  Approximately 1 month ago patient was admitted after she had seizure-like activity after coming back from dialysis, then became unresponsive with no pulse found requiring CPR for 40 minutes.  Patient had GCS of 3 when assessed by EMS but patient became fully oriented by the time she arrived in the ER.  She had MRI brain been which showed no acute findings, old right frontal encephalomalacia.  Since her discharge, she feels these tremors/jerking movements have gotten worse.  She notices it mostly in her legs and they occur when she tries to initiate movement.  She is having difficulty walking since the last 3 days and had 2 falls which brought her into the hospital.  She does not have any while at rest.  She also feels her legs have gradually become weak.  Denies worsening during the nighttime.  She is not on any sedating medications, psychotropic agents. Work-up in ED included a MRI of the C-spine which shows no impingement of the spinal cord.  She does have cervical spondylosis and degenerative changes.  Electrolytes are within normal limits.  BUN was only 30.  Past Medical History:  Diagnosis Date  . Acute on chronic heart failure ([place])   . Acute respiratory failure with hypoxia ([place])   . Alcohol abuse   . Arthralgia   . CAD (coronary artery  disease)    moderate primarily septal perforater  . CHF (congestive heart failure) ([place])   . COPD (chronic obstructive pulmonary disease) ([place])   . Depression   . HTN (hypertension)   . Hypercholesterolemia    primarily ldl-p and small particles  . RAS (renal artery stenosis) ([place]) 2002   by cath tysinger  . Renal disorder   . Smoking     Past Surgical History:  Procedure Laterality Date  . abdominal aortogram     perclose of the right femoral artery  . AV FISTULA PLACEMENT Right 07/11/2018   Procedure: Right arm ARTERIOVENOUS FISTULA CREATION;  Surgeon: [name], MD;  Location: [place];  Service: Vascular;  Laterality: Right;  . AVF placement Right   . CARDIAC CATHETERIZATION     left heart catheterization.  Coronary cineangiography. Lft ventricular cineangiography.    [name] LUNG  SURGERY     due to lung cancer per patient's report  . TUBAL LIGATION      Family History  Problem Relation Age of Onset  . Emphysema Father    Social History:  reports that she has been smoking cigarettes. She has smoked for the past 35.00 years. She has never used smokeless tobacco. She reports that she does not drink alcohol or use drugs.  Allergies:  Allergies  Allergen Reactions  . Citalopram Hydrobromide Hives  . Morphine And Related Itching    Medications:                                                                                                                        I reviewed home medications   ROS:                                                                                                                                     14 systems reviewed and negative except above    Examination:                                                                                                      General: Appears well-developed and well-nourished.  Psych: Affect appropriate to situation Eyes: No scleral injection HENT: No OP obstrucion Head: Normocephalic.  Cardiovascular:  Normal rate and regular rhythm.  Respiratory: Effort normal and breath sounds normal to anterior ascultation GI: Soft.  No distension. There is no tenderness.  Skin: WDI    Neurological Examination Mental Status: Alert, oriented, thought content appropriate.  Speech fluent without evidence of aphasia.  Able to follow 3 step commands without difficulty. Cranial Nerves: II: visual fields grossly normal, pupils equal, round, reactive to light and accommodation III,IV, VI: ptosis not present, extraocular muscles extra-ocular motions intact bilaterally V,VII: smile symmetric, facial light touch sensation normal bilaterally VIII: hearing normal bilaterally IX,X: gag reflex present XI: trapezius strength/neck flexion strength normal bilaterally XII: tongue strength normal  Motor: Right :  Upper extremity    Left:     Upper extremity 5/5 deltoid       5/5 deltoid 5/5 tricep      5/5 tricep 5/5 biceps      5/5 biceps  5/5wrist flexion     5/5 wrist flexion 5/5 wrist extension     5/5 wrist extension 5/5 hand grip      5/5 hand grip  Lower extremity     Lower extremity 4+/5 hip flexor      4/5 hip flexor 5/5 hip adductors     5/5 hip adductors 4+/5 hip abductors     4/5 hip abductors 4/5 quadricep                 4/5 quadriceps  5/5 hamstrings     5/5 hamstrings 5/5 plantar flexion       5/5 plantar flexion 4/5 plantar extension     4/5 plantar extension Tone and bulk:normal tone throughout; mild atrophy of bilateral thighs Sensory: Reduced sensation to light touch, temperature, pinprick over both feet and lower legs in stocking distribution, vibration intact. Deep Tendon Reflexes:  Right: Upper Extremity   Left: Upper extremity   biceps (C-5 to C-6) 2/4   biceps (C-5 to C-6) 2/4 tricep (C7) 2/4    triceps (C7) 2/4 Brachioradialis (C6) 2/4  Brachioradialis (C6) 2/4  Lower Extremity Lower Extremity  quadriceps (L-2 to L-4) 0/4   quadriceps (L-2 to L-4) 0/4 Achilles (S1) 0/4   Achilles  (S1) 0/4 Plantars: Right: downgoing   Left: downgoing Cerebellar: normal finger-to-nose, difficulty with b/l heel to shin    Lab Results: Basic Metabolic Panel: Recent Labs  Lab 03/31/19 1700  NA 135  K 3.6  CL 92*  CO2 28  GLUCOSE 104*  BUN 30*  CREATININE 4.73*  CALCIUM 9.1  MG 2.4    CBC: Recent Labs  Lab 03/31/19 1700  WBC 4.9  NEUTROABS 3.3  HGB 11.5*  HCT 36.2  MCV 104.0*  PLT 157    Coagulation Studies: No results for input(s): LABPROT, INR in the last 72 hours.  Imaging: Mr Cervical Spine Wo Contrast  Result Date: 03/31/2019 CLINICAL DATA:  Initial evaluation for progressive extremity weakness with jerking for 3 days. EXAM: MRI CERVICAL SPINE WITHOUT CONTRAST TECHNIQUE: Multiplanar, multisequence MR imaging of the cervical spine was performed. No intravenous contrast was administered. COMPARISON:  Prior CT from 02/24/2019. FINDINGS: Alignment: Mild straightening of the normal cervical lordosis. No listhesis. Vertebrae: Vertebral body heights maintained without evidence for acute or chronic fracture. Bone marrow signal intensity within normal limits. No discrete or worrisome osseous lesions. No abnormal marrow edema. Cord: Signal intensity within the cervical spinal cord is normal. Posterior Fossa, vertebral arteries, paraspinal tissues: Visualized brain and posterior fossa demonstrates no acute finding. Craniocervical junction normal. Paraspinous and prevertebral soft tissues within normal limits. Loss of normal flow void within the hypoplastic left vertebral artery, which could be related to slow flow and/or occlusion. Normal flow void seen within the dominant right vertebral artery. Disc levels: C2-C3: Mild disc bulge with uncovertebral hypertrophy. No significant canal or foraminal stenosis. C3-C4: Diffuse disc bulge with uncovertebral hypertrophy. Flattening of the ventral thecal sac without significant spinal stenosis. Foramina remain patent. C4-C5:  Circumferential disc osteophyte with intervertebral disc space narrowing. Broad posterior component flattens and effaces the ventral thecal sac. Resultant mild to moderate spinal stenosis without cord deformity. Severe bilateral C5 foraminal narrowing, left greater than right. C5-C6: Circumferential disc osteophyte with intervertebral disc  space narrowing. Flattening and partial effacement of the ventral thecal sac with resultant mild to moderate spinal stenosis. No cord deformity. Severe left with moderate right C6 foraminal stenosis. C6-C7: Circumferential disc osteophyte with intervertebral disc space narrowing. Broad posterior component, asymmetric to the left, flattens and partially indents the ventral thecal sac, greater on the left. Mild to moderate spinal stenosis without cord deformity. Moderate bilateral C7 foraminal stenosis. C7-T1:  Mild facet hypertrophy.  No stenosis. Visualized upper thoracic spine demonstrates no significant finding. IMPRESSION: 1. No acute abnormality within the cervical spine. Normal MRI appearance of the cervical spinal cord without cord impingement. 2. 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 3. Multifactorial degenerative changes with resultant multilevel foraminal narrowing as above. Notable findings include severe bilateral C5 foraminal stenosis, severe left with moderate right C6 foraminal narrowing, with moderate bilateral C7 foraminal stenosis. 4. Loss of normal flow void within the diminutive left vertebral artery, which could be related to slow flow and/or occlusion. Electronically Signed   By: Jeannine Boga M.D.   On: 03/31/2019 19:50   Dg Chest Port 1 View  Result Date: 03/31/2019 CLINICAL DATA:  Weakness EXAM: PORTABLE CHEST 1 VIEW COMPARISON:  02/27/2000 FINDINGS: The heart size and mediastinal contours are within normal limits. Unchanged elevation of the left hemidiaphragm. The visualized  skeletal structures are unremarkable. IMPRESSION: No acute abnormality of the lungs in AP portable projection. Unchanged elevation of the left hemidiaphragm. Electronically Signed   By: Eddie Candle M.D.   On: 03/31/2019 17:56     I have reviewed the above imaging; MR brain and MRI of C spine    ASSESSMENT AND PLAN  68 y.o. female with past medical history of hypertension, hyperlipidemia, COPD, CHF, coronary artery disease, alcohol abuse, end-stage renal disease on dialysis presents to the ED with progressive leg weakness and  jerking movements on initiation in both her legs.  This is unusual presentation,  patient has negative myoclonus/tremor like movement in both legs significantly more than upper extremities.  This is not seen at rest.  Her labs are not suggestive of significant uremia, electrolytes are within normal limits.  She is not hypercarbic. On examination patient does have diminished reflexes over patella and ankles compared to upper extremities which are normal.  She does have some weakness in both legs and  sensory neuropathy in stocking distribution, likely due to uremia.  She did have a questionable anoxic event 1 month ago so Berneda Rose is a consideration but given his absence at rest this is unlikely, the patient states that she had this prior to the event. Psychogenic also a consideration, but early at this point to make that conclusion.  Negative myoclonus/dyskinesia  Recommendations MRI of the Lumbar-spine Consider trial of Ativan? /Keppra or Vimpat  Or primidone TSH, B12     Oshay Stranahan [facility] Pager Number [phone]

## 2019-04-01 NOTE — Evaluation (Signed)
Speech Language Pathology Evaluation Patient Details Name: Kari Robinson MRN: [id number] DOB: [birth year] Today's Date: 04/01/2019 Time: 8101-7510 SLP Time Calculation (min) (ACUTE ONLY): 25 min  Problem List:  Patient Active Problem List   Diagnosis Date Noted  . Fall 04/01/2019  . Leg weakness, bilateral 04/01/2019  . Tobacco abuse 04/01/2019  . Alcohol abuse 04/01/2019  . Transient loss of consciousness 02/24/2019  . ESRD (end stage renal disease) ([name]) 02/24/2019  . Seizure-like activity ([name]) 02/24/2019  . NSTEMI (non-ST elevated myocardial infarction) ([name]) 02/24/2019  . Ischemic cardiomyopathy 02/24/2019  . Hypertensive emergency 09/30/2018  . Type II diabetes mellitus with renal manifestations ([name]) 09/30/2018  . HLD (hyperlipidemia) 09/30/2018  . Depression with anxiety 09/30/2018  . CKD (chronic kidney disease), stage V ([name]) 09/30/2018  . Acute on chronic respiratory failure with hypoxia ([name]) 09/30/2018  . Fluid overload 09/30/2018  . CAD (coronary artery disease) 09/30/2018  . Anemia in CKD (chronic kidney disease) 09/30/2018  . COPD (chronic obstructive pulmonary disease) ([name])   . Acute on chronic combined systolic and diastolic CHF (congestive heart failure) ([name])   . Solitary right kidney 06/24/2018  . Chronic systolic CHF (congestive heart failure) ([name]) 06/24/2018  . Acute CHF (congestive heart failure) ([place]) 06/07/2018  . Hypertensive emergency 06/07/2018  . CKD (chronic kidney disease) stage 5, GFR less than 15 ml/min ([name]) 06/07/2018  . Normocytic normochromic anemia 06/07/2018  . DM (diabetes mellitus), type 2 with renal complications ([name]) 25/85/2778  . Hypertensive urgency 06/07/2018  . Occlusion and stenosis of carotid artery without mention of cerebral infarction 01/19/2012  . RENAL ARTERY STENOSIS 09/10/2010  . ABDOMINAL BRUIT 08/12/2010  . DEPRESSION 08/11/2010  . PERIPHERAL VASCULAR DISEASE 08/11/2010  . Essential hypertension 08/08/2010  .  Coronary atherosclerosis 08/08/2010  . ARTHRALGIA 08/08/2010   Past Medical History:  Past Medical History:  Diagnosis Date  . Acute on chronic heart failure ([name])   . Acute respiratory failure with hypoxia ([place])   . Alcohol abuse   . Arthralgia   . CAD (coronary artery disease)    moderate primarily septal perforater  . CHF (congestive heart failure) ([place])   . COPD (chronic obstructive pulmonary disease) ([place])   . Depression   . HTN (hypertension)   . Hypercholesterolemia    primarily ldl-p and small particles  . RAS (renal artery stenosis) ([name]) 2002   by cath tysinger  . Renal disorder   . Smoking    Past Surgical History:  Past Surgical History:  Procedure Laterality Date  . abdominal aortogram     perclose of the right femoral artery  . AV FISTULA PLACEMENT Right 07/11/2018   Procedure: Right arm ARTERIOVENOUS FISTULA CREATION;  Surgeon: [name], MD;  Location: [facility];  Service: Vascular;  Laterality: Right;  . AVF placement Right   . CARDIAC CATHETERIZATION     left heart catheterization.  Coronary cineangiography. Lft ventricular cineangiography.    [name] LUNG SURGERY     due to lung cancer per patient's report  . TUBAL LIGATION     HPI:  68 y.o. female with past medical history of hypertension, hyperlipidemia, COPD, CHF, coronary artery disease, alcohol abuse, end-stage renal disease on dialysis presents to the ED with progressive jerking movements in both her legs. Approximately one month ago admitted with seizure, required CPR for 40 minutes.  Neuro following, dx pending.  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  Assessment / Plan / Recommendation Clinical Impression  Pt presents with normal receptive/expressive language; speech is fluent.  Notably, she had three episodes of speech disruption during our session, which was marked by  sudden onset dysarthria and lasted only a few seconds (<5).  Pt remarked that these speech changes have been occurring intermittently.  Cognition was marked by decreased short-term recall and impaired prospective memory.  Pt states that her memory has been problematic for years and denies any acute changes in recall.  Her verbal reasoning, alternating attention, and orientation were [facility].  No acute SLP needs are identified; no post-acute SLP f/u is recommended.  Will alert RN to speech changes during assessment.     SLP Assessment  SLP Recommendation/Assessment: Patient does not need any further Speech Lanaguage Pathology Services SLP Visit Diagnosis: Cognitive communication deficit (R41.841)    Follow Up Recommendations       Frequency and Duration           SLP Evaluation Cognition  Overall Cognitive Status: Difficult to assess Arousal/Alertness: Awake/alert Orientation Level: Oriented X4 Attention: Sustained Sustained Attention: Appears intact Memory: Impaired Memory Impairment: Storage deficit;Prospective memory;Decreased short term memory Decreased Short Term Memory: Verbal basic Awareness: Appears intact Problem Solving: Appears intact Executive Function: Reasoning Reasoning: Appears intact       Comprehension  Auditory Comprehension Overall Auditory Comprehension: Appears within functional limits for tasks assessed Reading Comprehension Reading Status: Within funtional limits    Expression Expression Primary Mode of Expression: Verbal Verbal Expression Overall Verbal Expression: Appears within functional limits for tasks assessed   Oral / Motor  Oral Motor/Sensory Function Overall Oral Motor/Sensory Function: Within functional limits Motor Speech Overall Motor Speech: Other (comment)(three episodes of disrupted flow of speech with impaired art)   GO                    Kari Robinson 04/01/2019, 12:18 PM   Kari Robinson L. Tivis Ringer, [place] Office number [phone] Pager [phone]

## 2019-04-01 NOTE — H&P (Addendum)
History and Physical    Kari Robinson GXQ:[id number] DOB: [birth year] DOA: 03/31/2019  Referring MD/NP/PA:   PCP: Patient, [name]   Patient coming from:  The patient is coming from home.  At baseline, pt is independent for most of ADL.        Chief Complaint: leg weakness and fall  HPI: Kari Robinson is a 68 y.o. female with medical history significant of ESRD-HD (MWF), hypertension, hyperlipidemia, COPD on 2 L oxygen, CHF with EF of 40%, depression, tobacco abuse, alcohol abuse in remission, CAD, carotid artery stenosis, who presents with leg weakness and fall.  Patient states that she has been having bilateral leg weakness for more than 3 days.  She states that she also has tingling feeling in both legs recently, sometimes has weakness and tingling in both arms. Per EDP, pt seems to have trembling in both legs at arrival to ED. No vision or hearing loss.  No slurred speech.  Patient states that she fell twice after she had dialysis today.  No significant injury.  No loss of consciousness.  Patient denies chest pain, shortness of breath, cough.  She had loose stool bowel movement earlier, which has resolved.  Currently no nausea vomiting, diarrhea or abdominal pain. No symptoms of UTI.  Denies fever chills.  ED Course: pt was found to have WBC 4.9, potassium 3.6, bicarbonate 28, creatinine 4.73, BUN 30.  Temperature normal, oxygen saturation 94% on 2 L oxygen, blood pressure 140/108, heart rate 46-104, chest x-ray negative for infiltration, showed unchanged left hemidiaphragm elevation.  MRI of C-spine showed some degenerative changes as well as some cervical stenosis, no cord compression. Pt is placed on tele bed for obs. Dr. Lorraine Lax of neuro was consulted.   Review of Systems:   General: no fevers, chills, no body weight gain, has fatigue HEENT: no blurry vision, hearing changes or sore throat Respiratory: no dyspnea, coughing, wheezing CV: no chest pain, no palpitations GI: no  nausea, vomiting, abdominal pain, diarrhea, constipation GU: no dysuria, burning on urination, increased urinary frequency, hematuria  Ext: no leg edema Neuro: no vision change or hearing loss. Has weakness and tingling in both legs and arms Skin: no rash, no skin tear. MSK: No muscle spasm, no deformity, no limitation of range of movement in spin Heme: No easy bruising.  Travel history: No recent long distant travel.  Allergy:  Allergies  Allergen Reactions   Citalopram Hydrobromide Hives   Morphine And Related Itching    Past Medical History:  Diagnosis Date   Acute on chronic heart failure ([name])    Acute respiratory failure with hypoxia ([name])    Alcohol abuse    Arthralgia    CAD (coronary artery disease)    moderate primarily septal perforater   CHF (congestive heart failure) ([name])    COPD (chronic obstructive pulmonary disease) ([name])    Depression    HTN (hypertension)    Hypercholesterolemia    primarily ldl-p and small particles   RAS (renal artery stenosis) ([place]) 2002   by cath tysinger   Renal disorder    Smoking     Past Surgical History:  Procedure Laterality Date   abdominal aortogram     perclose of the right femoral artery   AV FISTULA PLACEMENT Right 07/11/2018   Procedure: Right arm ARTERIOVENOUS FISTULA CREATION;  Surgeon: [name], MD;  Location: [facility];  Service: Vascular;  Laterality: Right;   AVF placement Right    CARDIAC CATHETERIZATION  left heart catheterization.  Coronary cineangiography. Lft ventricular cineangiography.     LUNG SURGERY     due to lung cancer per patient's report   TUBAL LIGATION      Social History:  reports that she has been smoking cigarettes. She has smoked for the past 35.00 years. She has never used smokeless tobacco. She reports that she does not drink alcohol or use drugs.  Family History:  Family History  Problem Relation Age of Onset   Emphysema Father      Prior to  Admission medications   Medication Sig Start Date End Date Taking? Authorizing Provider  albuterol (PROVENTIL HFA;VENTOLIN HFA) 108 (90 Base) MCG/ACT inhaler Inhale 2 puffs into the lungs every 4 (four) hours as needed for wheezing.  11/25/17   [provider]  albuterol (PROVENTIL) (2.5 MG/3ML) 0.083% nebulizer solution Take 2.5 mg by nebulization every 6 (six) hours as needed for wheezing or shortness of breath.  07/28/18   [provider]  aspirin EC 81 MG tablet Take 81 mg by mouth daily.    [provider]  atorvastatin (LIPITOR) 40 MG tablet Take 40 mg by mouth daily.    [provider]  b complex-vitamin c-folic acid (NEPHRO-VITE) 0.8 MG TABS tablet Take 1 tablet by mouth See admin instructions. Take one tablet by mouth on Sunday, Tuesday, Thursday, Saturday mornings. Take one tablet after dialysis on Monday, Wednesday, Friday 02/06/19   [provider]  budesonide-formoterol (SYMBICORT) 160-4.5 MCG/ACT inhaler Inhale 2 puffs into the lungs 2 (two) times daily.    [provider]  clopidogrel (PLAVIX) 75 MG tablet Take 75 mg by mouth daily.    [provider]  diclofenac sodium (VOLTAREN) 1 % GEL Apply 2 g topically 4 (four) times daily. 02/27/19   Hedges, Dellis Filbert, PA-C  hydrALAZINE (APRESOLINE) 100 MG tablet Take 1 tablet (100 mg total) by mouth 3 (three) times daily. Patient not taking: Reported on 02/25/2019 10/01/18 02/24/19  Purohit, Konrad Dolores, MD  HYDROcodone-acetaminophen (NORCO/VICODIN) 5-325 MG tablet Take 1 tablet by mouth every 6 (six) hours as needed for up to 12 doses for severe pain. 02/26/19   Antonieta Pert, MD  ipratropium-albuterol (DUONEB) 0.5-2.5 (3) MG/3ML SOLN Take 3 mLs by nebulization every 6 (six) hours as needed. Patient not taking: Reported on 02/25/2019 06/10/18   Dana Allan I, MD  isosorbide mononitrate (IMDUR) 30 MG 24 hr tablet Take 30 mg by mouth at bedtime.     [provider]  OXYGEN Inhale 2 L  into the lungs continuous.    [provider]  sodium bicarbonate 650 MG tablet Take 1 tablet (650 mg total) by mouth 2 (two) times daily. Patient not taking: Reported on 02/25/2019 06/09/18   Dana Allan I, MD  torsemide (DEMADEX) 20 MG tablet Take 2 tablets (40 mg total) by mouth daily. Patient not taking: Reported on 02/25/2019 06/10/18   Dana Allan I, MD    Physical Exam: Vitals:   04/01/19 0932 04/01/19 1000 04/01/19 1200 04/01/19 1652  BP: (!) 150/86 (!) 134/92 (!) 146/66 (!) 166/99  Pulse:  85 85 94  Resp:  18 18 18   Temp:  98.7 F (37.1 C) 97.7 F (36.5 C) 97.8 F (36.6 C)  TempSrc:  Oral Oral Oral  SpO2: 100% 100% 98% 100%  Weight:      Height:       General: Not in acute distress HEENT:       Eyes: PERRL, EOMI, no scleral icterus.  ENT: No discharge from the ears and nose, no pharynx injection, no tonsillar enlargement.        Neck: No JVD, no bruit, no mass felt. Heme: No neck lymph node enlargement. Cardiac: S1/S2, RRR, No murmurs, No gallops or rubs. Respiratory: No rales, wheezing, rhonchi or rubs. GI: Soft, nondistended, nontender, no rebound pain, no organomegaly, BS present. GU: No hematuria Ext: No pitting leg edema bilaterally. 2+DP/PT pulse bilaterally. Musculoskeletal: No joint deformities, No joint redness or warmth, no limitation of ROM in spin. Skin: No rashes.  Neuro: Alert, oriented X3, cranial nerves II-XII grossly intact. Muscle strength 3/5 in both legs and 4/5 in both arms. Sensation to light touch intact. Knee reflex 1+ bilaterally. Negative Babinski's sign.  Psych: Patient is not psychotic, no suicidal or hemocidal ideation.  Labs on Admission: I have personally reviewed following labs and imaging studies  CBC: Recent Labs  Lab 03/31/19 1700  WBC 4.9  NEUTROABS 3.3  HGB 11.5*  HCT 36.2  MCV 104.0*  PLT 518   Basic Metabolic Panel: Recent Labs  Lab 03/31/19 1700  NA 135  K 3.6  CL 92*  CO2 28  GLUCOSE  104*  BUN 30*  CREATININE 4.73*  CALCIUM 9.1  MG 2.4   GFR: Estimated Creatinine Clearance: 10.8 mL/min (A) (by C-G formula based on SCr of 4.73 mg/dL (H)). Liver Function Tests: No results for input(s): AST, ALT, ALKPHOS, BILITOT, PROT, ALBUMIN in the last 168 hours. No results for input(s): LIPASE, AMYLASE in the last 168 hours. No results for input(s): AMMONIA in the last 168 hours. Coagulation Profile: No results for input(s): INR, PROTIME in the last 168 hours. Cardiac Enzymes: No results for input(s): CKTOTAL, CKMB, CKMBINDEX, TROPONINI in the last 168 hours. BNP (last 3 results) No results for input(s): PROBNP in the last 8760 hours. HbA1C: Recent Labs    04/01/19 0448  HGBA1C 4.9   CBG: Recent Labs  Lab 04/01/19 0606  GLUCAP 107*   Lipid Profile: Recent Labs    04/01/19 0448  CHOL 125  HDL 51  LDLCALC 59  TRIG 76  CHOLHDL 2.5   Thyroid Function Tests: Recent Labs    04/01/19 1052  TSH 0.749   Anemia Panel: Recent Labs    04/01/19 1052  VITAMINB12 535   Urine analysis:    Component Value Date/Time   COLORURINE STRAW (A) 02/25/2019 0735   APPEARANCEUR CLEAR 02/25/2019 0735   LABSPEC 1.004 (L) 02/25/2019 0735   PHURINE 9.0 (H) 02/25/2019 0735   GLUCOSEU NEGATIVE 02/25/2019 0735   HGBUR SMALL (A) 02/25/2019 0735   BILIRUBINUR NEGATIVE 02/25/2019 [place] 02/25/2019 0735   PROTEINUR 100 (A) 02/25/2019 0735   NITRITE NEGATIVE 02/25/2019 0735   LEUKOCYTESUR NEGATIVE 02/25/2019 0735   Sepsis Labs: @LABRCNTIP (procalcitonin:4,lacticidven:4) ) Recent Results (from the past 240 hour(s))  SARS Coronavirus 2 (CEPHEID - Performed in [place] hospital lab), Hosp Order     Status: None   Collection Time: 04/01/19  1:25 AM  Result Value Ref Range Status   SARS Coronavirus 2 NEGATIVE NEGATIVE Final    Comment: (NOTE) If result is NEGATIVE SARS-CoV-2 target nucleic acids are NOT DETECTED. The SARS-CoV-2 RNA is generally detectable  in upper and lower  respiratory specimens during the acute phase of infection. The lowest  concentration of SARS-CoV-2 viral copies this assay can detect is 250  copies / mL. A negative result does not preclude SARS-CoV-2 infection  and should not be used as the sole basis  for treatment or other  patient management decisions.  A negative result may occur with  improper specimen collection / handling, submission of specimen other  than nasopharyngeal swab, presence of viral mutation(s) within the  areas targeted by this assay, and inadequate number of viral copies  (<250 copies / mL). A negative result must be combined with clinical  observations, patient history, and epidemiological information. If result is POSITIVE SARS-CoV-2 target nucleic acids are DETECTED. The SARS-CoV-2 RNA is generally detectable in upper and lower  respiratory specimens dur ing the acute phase of infection.  Positive  results are indicative of active infection with SARS-CoV-2.  Clinical  correlation with patient history and other diagnostic information is  necessary to determine patient infection status.  Positive results do  not rule out bacterial infection or co-infection with other viruses. If result is PRESUMPTIVE POSTIVE SARS-CoV-2 nucleic acids MAY BE PRESENT.   A presumptive positive result was obtained on the submitted specimen  and confirmed on repeat testing.  While 2019 novel coronavirus  (SARS-CoV-2) nucleic acids may be present in the submitted sample  additional confirmatory testing may be necessary for epidemiological  and / or clinical management purposes  to differentiate between  SARS-CoV-2 and other Sarbecovirus currently known to infect humans.  If clinically indicated additional testing with an alternate test  methodology [phone]) is advised. The SARS-CoV-2 RNA is generally  detectable in upper and lower respiratory sp ecimens during the acute  phase of infection. The expected result is  Negative. Fact Sheet for Patients:  StrictlyIdeas.no Fact Sheet for Healthcare Providers: BankingDealers.co.za This test is not yet approved or cleared by the [place] FDA and has been authorized for detection and/or diagnosis of SARS-CoV-2 by FDA under an Emergency Use Authorization (EUA).  This EUA will remain in effect (meaning this test can be used) for the duration of the COVID-19 declaration under Section 564(b)(1) of the Act, 21 U.S.C. section 360bbb-3(b)(1), unless the authorization is terminated or revoked sooner. Performed at [facility], [place] [address], [place], [place] [address]      Radiological Exams on Admission: Mr Brain Wo Contrast  Result Date: 04/01/2019 CLINICAL DATA:  Focal neuro deficit, suspect stroke EXAM: MRI HEAD WITHOUT CONTRAST TECHNIQUE: Multiplanar, multiecho pulse sequences of the brain and surrounding structures were obtained without intravenous contrast. COMPARISON:  MRI head 02/25/2019 FINDINGS: Brain: Negative for acute infarct. 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 Chronic ischemic change in the white matter. Ventricle size normal. Negative for mass. Vascular: Loss of flow void in the distal left vertebral artery is unchanged compatible with chronic occlusion. Remaining flow voids intact Skull and upper cervical spine: Negative Sinuses/Orbits: Negative Other: None IMPRESSION: Negative for acute infarct.  Chronic ischemia as above. Electronically Signed   By: Franchot Gallo M.D.   On: 04/01/2019 16:29   Mr Cervical Spine Wo Contrast  Result Date: 03/31/2019 CLINICAL DATA:  Initial evaluation for progressive extremity weakness with jerking for 3 days. EXAM: MRI CERVICAL SPINE WITHOUT CONTRAST TECHNIQUE: Multiplanar, multisequence MR imaging of the cervical spine was performed. No intravenous contrast was administered. COMPARISON:  Prior CT from  02/24/2019. FINDINGS: Alignment: Mild straightening of the normal cervical lordosis. No listhesis. Vertebrae: Vertebral body heights maintained without evidence for acute or chronic fracture. Bone marrow signal intensity within normal limits. No discrete or worrisome osseous lesions. No abnormal marrow edema. Cord: Signal intensity within the cervical spinal cord is normal. Posterior Fossa, vertebral arteries, paraspinal  tissues: Visualized brain and posterior fossa demonstrates no acute finding. Craniocervical junction normal. Paraspinous and prevertebral soft tissues within normal limits. Loss of normal flow void within the hypoplastic left vertebral artery, which could be related to slow flow and/or occlusion. Normal flow void seen within the dominant right vertebral artery. Disc levels: C2-C3: Mild disc bulge with uncovertebral hypertrophy. No significant canal or foraminal stenosis. C3-C4: Diffuse disc bulge with uncovertebral hypertrophy. Flattening of the ventral thecal sac without significant spinal stenosis. Foramina remain patent. C4-C5: Circumferential disc osteophyte with intervertebral disc space narrowing. Broad posterior component flattens and effaces the ventral thecal sac. Resultant mild to moderate spinal stenosis without cord deformity. Severe bilateral C5 foraminal narrowing, left greater than right. C5-C6: Circumferential disc osteophyte with intervertebral disc space narrowing. Flattening and partial effacement of the ventral thecal sac with resultant mild to moderate spinal stenosis. No cord deformity. Severe left with moderate right C6 foraminal stenosis. C6-C7: Circumferential disc osteophyte with intervertebral disc space narrowing. Broad posterior component, asymmetric to the left, flattens and partially indents the ventral thecal sac, greater on the left. Mild to moderate spinal stenosis without cord deformity. Moderate bilateral C7 foraminal stenosis. C7-T1:  Mild facet hypertrophy.  No  stenosis. Visualized upper thoracic spine demonstrates no significant finding. IMPRESSION: 1. No acute abnormality within the cervical spine. Normal MRI appearance of the cervical spinal cord without cord impingement. 2. 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 3. Multifactorial degenerative changes with resultant multilevel foraminal narrowing as above. Notable findings include severe bilateral C5 foraminal stenosis, severe left with moderate right C6 foraminal narrowing, with moderate bilateral C7 foraminal stenosis. 4. Loss of normal flow void within the diminutive left vertebral artery, which could be related to slow flow and/or occlusion. Electronically Signed   By: Jeannine Boga M.D.   On: 03/31/2019 19:50   Mr Lumbar Spine Wo Contrast  Result Date: 04/01/2019 CLINICAL DATA:  Lumbar radiculopathy EXAM: MRI LUMBAR SPINE WITHOUT CONTRAST TECHNIQUE: Multiplanar, multisequence MR imaging of the lumbar spine was performed. No intravenous contrast was administered. COMPARISON:  None. FINDINGS: Segmentation:  Normal Alignment:  Normal Vertebrae:  Negative for fracture or mass.  Normal bone marrow. Conus medullaris and cauda equina: Conus extends to the L1 level. Conus and cauda equina appear normal. Paraspinal and other soft tissues: Bilateral renal cyst. No retroperitoneal mass. Upper abdominal aorta measures up to 3.7 cm in diameter. Distal abdominal aorta measures 2.0 cm in diameter. Disc levels: L1-2: Mild disc bulging and spurring without stenosis L2-3: Small central disc protrusion with mild disc bulging. Negative for stenosis L3-4: Small broad-based disc protrusion centrally. Diffuse bulging of the disc with moderate facet hypertrophy. Mild spinal stenosis and mild to moderate subarticular stenosis bilaterally L4-5: Severe spinal stenosis. Moderately large central disc protrusion eccentric to the right. Marked facet and ligamentum flavum  hypertrophy. Severe subarticular stenosis bilaterally L5-S1: Disc degeneration with diffuse endplate spurring. Moderate subarticular stenosis bilaterally. IMPRESSION: Mild spinal stenosis L3-4 with mild to moderate subarticular stenosis bilaterally. Severe spinal stenosis L4-5 with central disc protrusion. Severe subarticular stenosis bilaterally Moderate subarticular stenosis bilaterally L5-S1 due to spurring. Abdominal aortic aneurysm. Upper abdominal aorta 3.7 cm in diameter. Distal abdominal aorta 2.0 cm in diameter. Recommend followup by ultrasound in 2 years. This recommendation follows ACR consensus guidelines: White Paper of the ACR Incidental Findings Committee II on Vascular Findings. J Am Coll Radiol 2013; 10:789-794. Aortic aneurysm NOS (ICD10-I71.9) Electronically Signed   By: Franchot Gallo M.D.  On: 04/01/2019 16:46   Dg Chest Port 1 View  Result Date: 03/31/2019 CLINICAL DATA:  Weakness EXAM: PORTABLE CHEST 1 VIEW COMPARISON:  02/27/2000 FINDINGS: The heart size and mediastinal contours are within normal limits. Unchanged elevation of the left hemidiaphragm. The visualized skeletal structures are unremarkable. IMPRESSION: No acute abnormality of the lungs in AP portable projection. Unchanged elevation of the left hemidiaphragm. Electronically Signed   By: Eddie Candle M.D.   On: 03/31/2019 17:56     EKG: Independently reviewed.  Sinus rhythm, QTC 465, LAE, frequent PVC   Assessment/Plan Principal Problem:   Leg weakness, bilateral Active Problems:   Essential hypertension   Chronic systolic CHF (congestive heart failure) ([name])   COPD (chronic obstructive pulmonary disease) ([name])   HLD (hyperlipidemia)   CAD (coronary artery disease)   Anemia in CKD (chronic kidney disease)   ESRD on dialysis ([place])   Fall   Tobacco abuse   Leg weakness, bilateral: Patient has bilateral leg weakness, and sometimes with tingling of both leg.  Patient also reports intermittent bilateral arm  weakness and tingling.  No vision change or hearing loss.   MRI of C-spine showed degenerative disc disease. Etiology is not clear.  Neurology, Dr. Lorraine Lax was consulted. After discussed with Dr. Lorraine Lax, we decide to get MRI-brain and L spin.  -will place on tele bed -Highly appreciate Dr. Arrie Eastern consultation -f/u MRI-brain and L spin -PT/OT -CM consult for home health need -will check A1c and FLP  HTN:  -Continue home medications: Hydralazine -IV hydralazine prn  Chronic systolic CHF (congestive heart failure) ([place]): 2D echo on 02/25/2019 showed EF 40-45%.  No leg edema or worsening shortness of breath. -Volume management per renal with HD  CAD (coronary artery disease): no CP -Continue aspirin, Plavix, Lipitor, Imdur  COPD (chronic obstructive pulmonary disease) ([name]): stable -Continue bronchodilators  HLD (hyperlipidemia): lipitor  Anemia in CKD (chronic kidney disease): Hgb 11.5, stable -f/u by CBC  Fall: Secondary to weakness -PT/OT  Tobacco abuse: -Did counseling about importance of quitting smoking -Nicotine patch  ESRD-HD (MWF):  potassium 3.6, bicarbonate 28, creatinine 4.73, BUN 30. -please call renal for HD   DVT ppx: SQ Heparin     Code Status: Full code Family Communication: None at bed side.       Disposition Plan:  Anticipate discharge back to previous home environment Consults called:  Dr. Lorraine Lax Admission status: Obs / tele     Date of Service 04/01/2019    [facility]   If 7PM-7AM, please contact night-coverage [email or web address] Password [email or web address] 04/01/2019, 5:20 PM

## 2019-04-01 NOTE — Discharge Summary (Signed)
. Physician Discharge Summary  Tailor Lucking RUE:[id number] DOB: July 26, 1951 DOA: 03/31/2019  PCP: Patient, [name]  Admit date: 03/31/2019 Discharge date: 04/01/2019  Admitted From: Home Disposition:  Discharged to home w/ [facility]  Recommendations for Outpatient Follow-up:  1. Follow up with PCP in 1-2 weeks 2. Please obtain BMP/CBC in one week 3. Neurosurgical follow up of L4-L5 stenosis  Home Health: Yes  Equipment/Devices: Rolling Walker   Discharge Condition: Stable  CODE STATUS: FULL   Brief/Interim Summary: Kari Robinson is a 68 y.o. female with medical history significant of ESRD-HD (MWF), hypertension, hyperlipidemia, COPD on 2 L oxygen, CHF with EF of 40%, depression, tobacco abuse, alcohol abuse in remission, CAD, carotid artery stenosis, who presents with leg weakness and fall.  Patient states that she has been having bilateral leg weakness for more than 3 days.  She states that she also has tingling feeling in both legs recently, sometimes has weakness and tingling in both arms. Per EDP, pt seems to have trembling in both legs at arrival to ED. No vision or hearing loss.  No slurred speech.  Patient states that she fell twice after she had dialysis today.  No significant injury.  No loss of consciousness.  Patient denies chest pain, shortness of breath, cough.  She had loose stool bowel movement earlier, which has resolved.  Currently no nausea vomiting, diarrhea or abdominal pain. No symptoms of UTI.  Denies fever chills.  Leg weakness, bilateral:      - Patient has bilateral leg weakness, and sometimes with tingling of both leg.       - Patient also reports intermittent bilateral arm weakness and tingling.      - MRI of C-spine showed degenerative disc disease.      - Neurology consulted; MR L-spine ordered     - PT/OT: rec rolling walker, HHOT     - A1c 4.9 and FLP ok     - B12 wnl     - MRI lumbar: Severe spinal stenosis L4-5 with central disc protrusion.  Severe subarticular stenosis bilaterally Moderate subarticular stenosis bilaterally L5-S1 due to spurring.     - Neurosurgery evaluated and rec'd intervention, but patient is declining at this time  HTN:      - hydralazine 50mg  TID  Chronic systolic CHF:      - 2D echo on 02/25/2019 showed EF 40-45%.  No leg edema or worsening shortness of breath.     - Volume management per renal with HD  CAD (coronary artery disease)     - no CP     - Continue aspirin, Plavix, Lipitor, Imdur  COPD (chronic obstructive pulmonary disease):      - stable     - Continue bronchodilators  HLD (hyperlipidemia):     - lipitor  Anemia in CKD (chronic kidney disease):      - Hgb 11.5, stable  Fall:     - Secondary to weakness     - PT/OT: see above  Tobacco abuse:     - Did counseling about importance of quitting smoking     - Nicotine patch  AAA     - VSS     - MR lumbar: Abdominal aortic aneurysm. Upper abdominal aorta 3.7 cm in diameter. Distal abdominal aorta 2.0 cm in diameter. Recommend followup by ultrasound in 2 years. This recommendation follows ACR consensus guidelines: White Paper of the ACR Incidental Findings Committee II on Vascular Findings. J Am Coll Radiol 2013;  33:825-053. Aortic aneurysm NOS (ICD10-I71.9)     - follow up outpt as above  Discharge Diagnoses:  Principal Problem:   Leg weakness, bilateral Active Problems:   Essential hypertension   Chronic systolic CHF (congestive heart failure) ([name])   COPD (chronic obstructive pulmonary disease) ([name])   HLD (hyperlipidemia)   CAD (coronary artery disease)   Anemia in CKD (chronic kidney disease)   Fall   Tobacco abuse    Discharge Instructions   Allergies as of 04/02/2019      Reactions   Citalopram Hydrobromide Hives   Morphine And Related Itching      Medication List    STOP taking these medications   ipratropium-albuterol 0.5-2.5 (3) MG/3ML Soln Commonly known as:  DUONEB   sodium bicarbonate  650 MG tablet   torsemide 20 MG tablet Commonly known as:  DEMADEX     TAKE these medications   albuterol 108 (90 Base) MCG/ACT inhaler Commonly known as:  VENTOLIN HFA Inhale 2 puffs into the lungs every 4 (four) hours as needed for wheezing.   albuterol (2.5 MG/3ML) 0.083% nebulizer solution Commonly known as:  PROVENTIL Take 2.5 mg by nebulization every 6 (six) hours as needed for wheezing or shortness of breath.   aspirin EC 81 MG tablet Take 81 mg by mouth daily.   atorvastatin 40 MG tablet Commonly known as:  LIPITOR Take 40 mg by mouth daily.   b complex-vitamin c-folic acid 0.8 MG Tabs tablet Take 1 tablet by mouth See admin instructions. Take one tablet by mouth on Sunday, Tuesday, Thursday, Saturday mornings. Take one tablet after dialysis on Monday, Wednesday, Friday   budesonide-formoterol 160-4.5 MCG/ACT inhaler Commonly known as:  SYMBICORT Inhale 2 puffs into the lungs 2 (two) times daily.   clopidogrel 75 MG tablet Commonly known as:  PLAVIX Take 75 mg by mouth daily.   diclofenac sodium 1 % Gel Commonly known as:  Voltaren Apply 2 g topically 4 (four) times daily.   hydrALAZINE 50 MG tablet Commonly known as:  APRESOLINE Take 1 tablet (50 mg total) by mouth 3 (three) times daily for 30 days. What changed:    medication strength  how much to take   HYDROcodone-acetaminophen 5-325 MG tablet Commonly known as:  NORCO/VICODIN Take 1 tablet by mouth every 8 (eight) hours as needed for up to 3 days for moderate pain. What changed:    when to take this  reasons to take this   isosorbide mononitrate 30 MG 24 hr tablet Commonly known as:  IMDUR Take 30 mg by mouth at bedtime.   OXYGEN Inhale 2 L into the lungs continuous.            Durable Medical Equipment  (From admission, onward)         Start     Ordered   04/01/19 1438  For home use only DME Walker rolling  Once    Question:  Patient needs a walker to treat with the following  condition  Answer:  Gait disturbance   04/01/19 1440   04/01/19 1116  For home use only DME Walker rolling  Once    Question:  Patient needs a walker to treat with the following condition  Answer:  Weakness   04/01/19 1117         Follow-up Information    [facility] Follow up.   Specialty:  Home Health Services Why:  They will call in 1-2 days to set up your first home health  appointment.          Allergies  Allergen Reactions  . Citalopram Hydrobromide Hives  . Morphine And Related Itching    Consultations:  Neurology  Neurosurgery   Procedures/Studies: Mr Cervical Spine Wo Contrast  Result Date: 03/31/2019 CLINICAL DATA:  Initial evaluation for progressive extremity weakness with jerking for 3 days. EXAM: MRI CERVICAL SPINE WITHOUT CONTRAST TECHNIQUE: Multiplanar, multisequence MR imaging of the cervical spine was performed. No intravenous contrast was administered. COMPARISON:  Prior CT from 02/24/2019. FINDINGS: Alignment: Mild straightening of the normal cervical lordosis. No listhesis. Vertebrae: Vertebral body heights maintained without evidence for acute or chronic fracture. Bone marrow signal intensity within normal limits. No discrete or worrisome osseous lesions. No abnormal marrow edema. Cord: Signal intensity within the cervical spinal cord is normal. Posterior Fossa, vertebral arteries, paraspinal tissues: Visualized brain and posterior fossa demonstrates no acute finding. Craniocervical junction normal. Paraspinous and prevertebral soft tissues within normal limits. Loss of normal flow void within the hypoplastic left vertebral artery, which could be related to slow flow and/or occlusion. Normal flow void seen within the dominant right vertebral artery. Disc levels: C2-C3: Mild disc bulge with uncovertebral hypertrophy. No significant canal or foraminal stenosis. C3-C4: Diffuse disc bulge with uncovertebral hypertrophy. Flattening of the ventral  thecal sac without significant spinal stenosis. Foramina remain patent. C4-C5: Circumferential disc osteophyte with intervertebral disc space narrowing. Broad posterior component flattens and effaces the ventral thecal sac. Resultant mild to moderate spinal stenosis without cord deformity. Severe bilateral C5 foraminal narrowing, left greater than right. C5-C6: Circumferential disc osteophyte with intervertebral disc space narrowing. Flattening and partial effacement of the ventral thecal sac with resultant mild to moderate spinal stenosis. No cord deformity. Severe left with moderate right C6 foraminal stenosis. C6-C7: Circumferential disc osteophyte with intervertebral disc space narrowing. Broad posterior component, asymmetric to the left, flattens and partially indents the ventral thecal sac, greater on the left. Mild to moderate spinal stenosis without cord deformity. Moderate bilateral C7 foraminal stenosis. C7-T1:  Mild facet hypertrophy.  No stenosis. Visualized upper thoracic spine demonstrates no significant finding. IMPRESSION: 1. No acute abnormality within the cervical spine. Normal MRI appearance of the cervical spinal cord without cord impingement. 2. 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 3. Multifactorial degenerative changes with resultant multilevel foraminal narrowing as above. Notable findings include severe bilateral C5 foraminal stenosis, severe left with moderate right C6 foraminal narrowing, with moderate bilateral C7 foraminal stenosis. 4. Loss of normal flow void within the diminutive left vertebral artery, which could be related to slow flow and/or occlusion. Electronically Signed   By: Jeannine Boga M.D.   On: 03/31/2019 19:50   Dg Chest Port 1 View  Result Date: 03/31/2019 CLINICAL DATA:  Weakness EXAM: PORTABLE CHEST 1 VIEW COMPARISON:  02/27/2000 FINDINGS: The heart size and mediastinal contours are within  normal limits. Unchanged elevation of the left hemidiaphragm. The visualized skeletal structures are unremarkable. IMPRESSION: No acute abnormality of the lungs in AP portable projection. Unchanged elevation of the left hemidiaphragm. Electronically Signed   By: Eddie Candle M.D.   On: 03/31/2019 17:56       Subjective: "Do I get to go home?"  Discharge Exam: Vitals:   04/01/19 1000 04/01/19 1200  BP: (!) 134/92 (!) 146/66  Pulse: 85 85  Resp: 18 18  Temp: 98.7 F (37.1 C) 97.7 F (36.5 C)  SpO2: 100% 98%   Vitals:   04/01/19 0800 04/01/19 0932  04/01/19 1000 04/01/19 1200  BP: (!) 146/71 (!) 150/86 (!) 134/92 (!) 146/66  Pulse:   85 85  Resp: 16  18 18   Temp: 97.7 F (36.5 C)  98.7 F (37.1 C) 97.7 F (36.5 C)  TempSrc: Oral  Oral Oral  SpO2: 99% 100% 100% 98%  Weight:      Height:        General: 68 y.o. female resting in bed in NAD Cardiovascular: RRR, +S1, S2, no m/g/r, equal pulses throughout Respiratory: CTABL, no w/r/r, normal WOB GI: BS+, NDNT, no masses noted, no organomegaly noted MSK: No e/c/c Skin: No rashes, bruises, ulcerations noted Neuro: A&O x 3, no focal deficits    The results of significant diagnostics from this hospitalization (including imaging, microbiology, ancillary and laboratory) are listed below for reference.     Microbiology: Recent Results (from the past 240 hour(s))  SARS Coronavirus 2 (CEPHEID - Performed in [place] hospital lab), Hosp Order     Status: None   Collection Time: 04/01/19  1:25 AM  Result Value Ref Range Status   SARS Coronavirus 2 NEGATIVE NEGATIVE Final    Comment: (NOTE) If result is NEGATIVE SARS-CoV-2 target nucleic acids are NOT DETECTED. The SARS-CoV-2 RNA is generally detectable in upper and lower  respiratory specimens during the acute phase of infection. The lowest  concentration of SARS-CoV-2 viral copies this assay can detect is 250  copies / mL. A negative result does not preclude SARS-CoV-2  infection  and should not be used as the sole basis for treatment or other  patient management decisions.  A negative result may occur with  improper specimen collection / handling, submission of specimen other  than nasopharyngeal swab, presence of viral mutation(s) within the  areas targeted by this assay, and inadequate number of viral copies  (<250 copies / mL). A negative result must be combined with clinical  observations, patient history, and epidemiological information. If result is POSITIVE SARS-CoV-2 target nucleic acids are DETECTED. The SARS-CoV-2 RNA is generally detectable in upper and lower  respiratory specimens dur ing the acute phase of infection.  Positive  results are indicative of active infection with SARS-CoV-2.  Clinical  correlation with patient history and other diagnostic information is  necessary to determine patient infection status.  Positive results do  not rule out bacterial infection or co-infection with other viruses. If result is PRESUMPTIVE POSTIVE SARS-CoV-2 nucleic acids MAY BE PRESENT.   A presumptive positive result was obtained on the submitted specimen  and confirmed on repeat testing.  While 2019 novel coronavirus  (SARS-CoV-2) nucleic acids may be present in the submitted sample  additional confirmatory testing may be necessary for epidemiological  and / or clinical management purposes  to differentiate between  SARS-CoV-2 and other Sarbecovirus currently known to infect humans.  If clinically indicated additional testing with an alternate test  methodology [phone]) is advised. The SARS-CoV-2 RNA is generally  detectable in upper and lower respiratory sp ecimens during the acute  phase of infection. The expected result is Negative. Fact Sheet for Patients:  StrictlyIdeas.no Fact Sheet for Healthcare Providers: BankingDealers.co.za This test is not yet approved or cleared by the [place]  FDA and has been authorized for detection and/or diagnosis of SARS-CoV-2 by FDA under an Emergency Use Authorization (EUA).  This EUA will remain in effect (meaning this test can be used) for the duration of the COVID-19 declaration under Section 564(b)(1) of the Act, 21 U.S.C. section 360bbb-3(b)(1), unless the authorization  is terminated or revoked sooner. Performed at [facility], [place] [address], [place], [place] [address]      Labs: BNP (last 3 results) Recent Labs    06/06/18 2236 06/23/18 1040 09/30/18 0256  BNP 2,726.8* 3,557.5* >3,818.2*   Basic Metabolic Panel: Recent Labs  Lab 03/31/19 1700  NA 135  K 3.6  CL 92*  CO2 28  GLUCOSE 104*  BUN 30*  CREATININE 4.73*  CALCIUM 9.1  MG 2.4   Liver Function Tests: No results for input(s): AST, ALT, ALKPHOS, BILITOT, PROT, ALBUMIN in the last 168 hours. No results for input(s): LIPASE, AMYLASE in the last 168 hours. No results for input(s): AMMONIA in the last 168 hours. CBC: Recent Labs  Lab 03/31/19 1700  WBC 4.9  NEUTROABS 3.3  HGB 11.5*  HCT 36.2  MCV 104.0*  PLT 157   Cardiac Enzymes: No results for input(s): CKTOTAL, CKMB, CKMBINDEX, TROPONINI in the last 168 hours. BNP: Invalid input(s): POCBNP CBG: Recent Labs  Lab 04/01/19 0606  GLUCAP 107*   D-Dimer No results for input(s): DDIMER in the last 72 hours. Hgb A1c Recent Labs    04/01/19 0448  HGBA1C 4.9   Lipid Profile Recent Labs    04/01/19 0448  CHOL 125  HDL 51  LDLCALC 59  TRIG 76  CHOLHDL 2.5   Thyroid function studies Recent Labs    04/01/19 1052  TSH 0.749   Anemia work up Recent Labs    04/01/19 1052  VITAMINB12 535   Urinalysis    Component Value Date/Time   COLORURINE STRAW (A) 02/25/2019 [place] 02/25/2019 0735   LABSPEC 1.004 (L) 02/25/2019 0735   PHURINE 9.0 (H) 02/25/2019 0735   GLUCOSEU NEGATIVE 02/25/2019 0735   HGBUR SMALL (A) 02/25/2019 0735   [place] NEGATIVE  02/25/2019 0735   KETONESUR NEGATIVE 02/25/2019 0735   PROTEINUR 100 (A) 02/25/2019 0735   NITRITE NEGATIVE 02/25/2019 0735   LEUKOCYTESUR NEGATIVE 02/25/2019 0735   Sepsis Labs Invalid input(s): PROCALCITONIN,  WBC,  LACTICIDVEN Microbiology Recent Results (from the past 240 hour(s))  SARS Coronavirus 2 (CEPHEID - Performed in [place] hospital lab), Hosp Order     Status: None   Collection Time: 04/01/19  1:25 AM  Result Value Ref Range Status   SARS Coronavirus 2 NEGATIVE NEGATIVE Final    Comment: (NOTE) If result is NEGATIVE SARS-CoV-2 target nucleic acids are NOT DETECTED. The SARS-CoV-2 RNA is generally detectable in upper and lower  respiratory specimens during the acute phase of infection. The lowest  concentration of SARS-CoV-2 viral copies this assay can detect is 250  copies / mL. A negative result does not preclude SARS-CoV-2 infection  and should not be used as the sole basis for treatment or other  patient management decisions.  A negative result may occur with  improper specimen collection / handling, submission of specimen other  than nasopharyngeal swab, presence of viral mutation(s) within the  areas targeted by this assay, and inadequate number of viral copies  (<250 copies / mL). A negative result must be combined with clinical  observations, patient history, and epidemiological information. If result is POSITIVE SARS-CoV-2 target nucleic acids are DETECTED. The SARS-CoV-2 RNA is generally detectable in upper and lower  respiratory specimens dur ing the acute phase of infection.  Positive  results are indicative of active infection with SARS-CoV-2.  Clinical  correlation with patient history and other diagnostic information is  necessary to determine patient infection status.  Positive results do  not rule out bacterial infection or co-infection with other viruses. If result is PRESUMPTIVE POSTIVE SARS-CoV-2 nucleic acids MAY BE PRESENT.   A presumptive  positive result was obtained on the submitted specimen  and confirmed on repeat testing.  While 2019 novel coronavirus  (SARS-CoV-2) nucleic acids may be present in the submitted sample  additional confirmatory testing may be necessary for epidemiological  and / or clinical management purposes  to differentiate between  SARS-CoV-2 and other Sarbecovirus currently known to infect humans.  If clinically indicated additional testing with an alternate test  methodology [phone]) is advised. The SARS-CoV-2 RNA is generally  detectable in upper and lower respiratory sp ecimens during the acute  phase of infection. The expected result is Negative. Fact Sheet for Patients:  StrictlyIdeas.no Fact Sheet for Healthcare Providers: BankingDealers.co.za This test is not yet approved or cleared by the [place] FDA and has been authorized for detection and/or diagnosis of SARS-CoV-2 by FDA under an Emergency Use Authorization (EUA).  This EUA will remain in effect (meaning this test can be used) for the duration of the COVID-19 declaration under Section 564(b)(1) of the Act, 21 U.S.C. section 360bbb-3(b)(1), unless the authorization is terminated or revoked sooner. Performed at [facility], [place] [address] [place],  [address]      Time coordinating discharge: 45 minutes spent in the coordination of this discharge  SIGNED:   Jonnie Finner, DO  [facility] 04/01/2019, 2:44 PM Pager   If 7PM-7AM, please contact night-coverage [email or web address] Password [email or web address]

## 2019-04-01 NOTE — Progress Notes (Signed)
Kari Robinson  PROGRESS NOTE    Kari Robinson  DEY:[id number] DOB: Aug 08, 1951 DOA: 03/31/2019 PCP: Patient, [name]   Brief Narrative:    Kari Robinson is a 68 y.o. female with medical history significant of ESRD-HD (MWF), hypertension, hyperlipidemia, COPD on 2 L oxygen, CHF with EF of 40%, depression, tobacco abuse, alcohol abuse in remission, CAD, carotid artery stenosis, who presents with leg weakness and fall.  Patient states that she has been having bilateral leg weakness for more than 3 days.  She states that she also has tingling feeling in both legs recently, sometimes has weakness and tingling in both arms. Per EDP, pt seems to have trembling in both legs at arrival to ED. No vision or hearing loss.  No slurred speech.  Patient states that she fell twice after she had dialysis today.  No significant injury.  No loss of consciousness.  Patient denies chest pain, shortness of breath, cough.  She had loose stool bowel movement earlier, which has resolved.  Currently no nausea vomiting, diarrhea or abdominal pain. No symptoms of UTI.  Denies fever chills.   Assessment & Plan:   Principal Problem:   Leg weakness, bilateral Active Problems:   Essential hypertension   Chronic systolic CHF (congestive heart failure) ([name])   COPD (chronic obstructive pulmonary disease) ([name])   HLD (hyperlipidemia)   CAD (coronary artery disease)   Anemia in CKD (chronic kidney disease)   Fall   Tobacco abuse   Leg weakness, bilateral:      - Patient has bilateral leg weakness, and sometimes with tingling of both leg.       - Patient also reports intermittent bilateral arm weakness and tingling.      - MRI of C-spine showed degenerative disc disease.      - Neurology consulted; MR L-spine ordered     - PT/OT: rec rolling walker, HHOT     - A1c 4.9 and FLP ok     - B12 wnl  HTN:      - hydralazine 50mg  TID  Chronic systolic CHF:      - 2D echo on 02/25/2019 showed EF 40-45%.  No leg edema or  worsening shortness of breath.     - Volume management per renal with HD  CAD (coronary artery disease)     - no CP     - Continue aspirin, Plavix, Lipitor, Imdur  COPD (chronic obstructive pulmonary disease):      - stable     - Continue bronchodilators  HLD (hyperlipidemia):     - lipitor  Anemia in CKD (chronic kidney disease):      - Hgb 11.5, stable  Fall:     - Secondary to weakness     - PT/OT: see above  Tobacco abuse:     - Did counseling about importance of quitting smoking     - Nicotine patch   DVT prophylaxis: heparin Code Status: FULL   Disposition Plan: To home when w/u complete   Consultants:   Neurology   Subjective: "I am ok. So what do you think is causing this?"  Objective: Vitals:   04/01/19 0800 04/01/19 0932 04/01/19 1000 04/01/19 1200  BP: (!) 146/71 (!) 150/86 (!) 134/92 (!) 146/66  Pulse:   85 85  Resp: 16  18 18   Temp: 97.7 F (36.5 C)  98.7 F (37.1 C) 97.7 F (36.5 C)  TempSrc: Oral  Oral Oral  SpO2: 99% 100% 100% 98%  Weight:  Height:        Intake/Output Summary (Last 24 hours) at 04/01/2019 1453 Last data filed at 04/01/2019 0217 Gross per 24 hour  Intake 222 ml  Output --  Net 222 ml   Filed Weights   03/31/19 1629 04/01/19 0209  Weight: 64.9 kg 67.7 kg    Examination:  General: 68 y.o. female resting in bed in NAD Cardiovascular: RRR, +S1, S2, no m/g/r, equal pulses throughout Respiratory: CTABL, no w/r/r, normal WOB GI: BS+, NDNT, no masses noted, no organomegaly noted MSK: No e/c/c Skin: No rashes, bruises, ulcerations noted Neuro: A&O x 3, no focal deficits     Data Reviewed: I have personally reviewed following labs and imaging studies.  CBC: Recent Labs  Lab 03/31/19 1700  WBC 4.9  NEUTROABS 3.3  HGB 11.5*  HCT 36.2  MCV 104.0*  PLT 423   Basic Metabolic Panel: Recent Labs  Lab 03/31/19 1700  NA 135  K 3.6  CL 92*  CO2 28  GLUCOSE 104*  BUN 30*  CREATININE 4.73*   CALCIUM 9.1  MG 2.4   GFR: Estimated Creatinine Clearance: 10.8 mL/min (A) (by C-G formula based on SCr of 4.73 mg/dL (H)). Liver Function Tests: No results for input(s): AST, ALT, ALKPHOS, BILITOT, PROT, ALBUMIN in the last 168 hours. No results for input(s): LIPASE, AMYLASE in the last 168 hours. No results for input(s): AMMONIA in the last 168 hours. Coagulation Profile: No results for input(s): INR, PROTIME in the last 168 hours. Cardiac Enzymes: No results for input(s): CKTOTAL, CKMB, CKMBINDEX, TROPONINI in the last 168 hours. BNP (last 3 results) No results for input(s): PROBNP in the last 8760 hours. HbA1C: Recent Labs    04/01/19 0448  HGBA1C 4.9   CBG: Recent Labs  Lab 04/01/19 0606  GLUCAP 107*   Lipid Profile: Recent Labs    04/01/19 0448  CHOL 125  HDL 51  LDLCALC 59  TRIG 76  CHOLHDL 2.5   Thyroid Function Tests: Recent Labs    04/01/19 1052  TSH 0.749   Anemia Panel: Recent Labs    04/01/19 1052  VITAMINB12 535   Sepsis Labs: No results for input(s): PROCALCITON, LATICACIDVEN in the last 168 hours.  Recent Results (from the past 240 hour(s))  SARS Coronavirus 2 (CEPHEID - Performed in [facility] hospital lab), Hosp Order     Status: None   Collection Time: 04/01/19  1:25 AM  Result Value Ref Range Status   SARS Coronavirus 2 NEGATIVE NEGATIVE Final    Comment: (NOTE) If result is NEGATIVE SARS-CoV-2 target nucleic acids are NOT DETECTED. The SARS-CoV-2 RNA is generally detectable in upper and lower  respiratory specimens during the acute phase of infection. The lowest  concentration of SARS-CoV-2 viral copies this assay can detect is 250  copies / mL. A negative result does not preclude SARS-CoV-2 infection  and should not be used as the sole basis for treatment or other  patient management decisions.  A negative result may occur with  improper specimen collection / handling, submission of specimen other  than nasopharyngeal swab,  presence of viral mutation(s) within the  areas targeted by this assay, and inadequate number of viral copies  (<250 copies / mL). A negative result must be combined with clinical  observations, patient history, and epidemiological information. If result is POSITIVE SARS-CoV-2 target nucleic acids are DETECTED. The SARS-CoV-2 RNA is generally detectable in upper and lower  respiratory specimens dur ing the acute phase of infection.  Positive  results are indicative of active infection with SARS-CoV-2.  Clinical  correlation with patient history and other diagnostic information is  necessary to determine patient infection status.  Positive results do  not rule out bacterial infection or co-infection with other viruses. If result is PRESUMPTIVE POSTIVE SARS-CoV-2 nucleic acids MAY BE PRESENT.   A presumptive positive result was obtained on the submitted specimen  and confirmed on repeat testing.  While 2019 novel coronavirus  (SARS-CoV-2) nucleic acids may be present in the submitted sample  additional confirmatory testing may be necessary for epidemiological  and / or clinical management purposes  to differentiate between  SARS-CoV-2 and other Sarbecovirus currently known to infect humans.  If clinically indicated additional testing with an alternate test  methodology [phone]) is advised. The SARS-CoV-2 RNA is generally  detectable in upper and lower respiratory sp ecimens during the acute  phase of infection. The expected result is Negative. Fact Sheet for Patients:  StrictlyIdeas.no Fact Sheet for Healthcare Providers: BankingDealers.co.za This test is not yet approved or cleared by the [place] FDA and has been authorized for detection and/or diagnosis of SARS-CoV-2 by FDA under an Emergency Use Authorization (EUA).  This EUA will remain in effect (meaning this test can be used) for the duration of the COVID-19 declaration under  Section 564(b)(1) of the Act, 21 U.S.C. section 360bbb-3(b)(1), unless the authorization is terminated or revoked sooner. Performed at [facility], [place] [address], [place],  [address]          Radiology Studies: Mr Cervical Spine Wo Contrast  Result Date: 03/31/2019 CLINICAL DATA:  Initial evaluation for progressive extremity weakness with jerking for 3 days. EXAM: MRI CERVICAL SPINE WITHOUT CONTRAST TECHNIQUE: Multiplanar, multisequence MR imaging of the cervical spine was performed. No intravenous contrast was administered. COMPARISON:  Prior CT from 02/24/2019. FINDINGS: Alignment: Mild straightening of the normal cervical lordosis. No listhesis. Vertebrae: Vertebral body heights maintained without evidence for acute or chronic fracture. Bone marrow signal intensity within normal limits. No discrete or worrisome osseous lesions. No abnormal marrow edema. Cord: Signal intensity within the cervical spinal cord is normal. Posterior Fossa, vertebral arteries, paraspinal tissues: Visualized brain and posterior fossa demonstrates no acute finding. Craniocervical junction normal. Paraspinous and prevertebral soft tissues within normal limits. Loss of normal flow void within the hypoplastic left vertebral artery, which could be related to slow flow and/or occlusion. Normal flow void seen within the dominant right vertebral artery. Disc levels: C2-C3: Mild disc bulge with uncovertebral hypertrophy. No significant canal or foraminal stenosis. C3-C4: Diffuse disc bulge with uncovertebral hypertrophy. Flattening of the ventral thecal sac without significant spinal stenosis. Foramina remain patent. C4-C5: Circumferential disc osteophyte with intervertebral disc space narrowing. Broad posterior component flattens and effaces the ventral thecal sac. Resultant mild to moderate spinal stenosis without cord deformity. Severe bilateral C5 foraminal narrowing, left greater than right. C5-C6:  Circumferential disc osteophyte with intervertebral disc space narrowing. Flattening and partial effacement of the ventral thecal sac with resultant mild to moderate spinal stenosis. No cord deformity. Severe left with moderate right C6 foraminal stenosis. C6-C7: Circumferential disc osteophyte with intervertebral disc space narrowing. Broad posterior component, asymmetric to the left, flattens and partially indents the ventral thecal sac, greater on the left. Mild to moderate spinal stenosis without cord deformity. Moderate bilateral C7 foraminal stenosis. C7-T1:  Mild facet hypertrophy.  No stenosis. Visualized upper thoracic spine demonstrates no significant finding. IMPRESSION: 1. No acute abnormality within the cervical spine. Normal MRI  appearance of the cervical spinal cord without cord impingement. 2. 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 3. Multifactorial degenerative changes with resultant multilevel foraminal narrowing as above. Notable findings include severe bilateral C5 foraminal stenosis, severe left with moderate right C6 foraminal narrowing, with moderate bilateral C7 foraminal stenosis. 4. Loss of normal flow void within the diminutive left vertebral artery, which could be related to slow flow and/or occlusion. Electronically Signed   By: Jeannine Boga M.D.   On: 03/31/2019 19:50   Dg Chest Port 1 View  Result Date: 03/31/2019 CLINICAL DATA:  Weakness EXAM: PORTABLE CHEST 1 VIEW COMPARISON:  02/27/2000 FINDINGS: The heart size and mediastinal contours are within normal limits. Unchanged elevation of the left hemidiaphragm. The visualized skeletal structures are unremarkable. IMPRESSION: No acute abnormality of the lungs in AP portable projection. Unchanged elevation of the left hemidiaphragm. Electronically Signed   By: Eddie Candle M.D.   On: 03/31/2019 17:56        Scheduled Meds:  aspirin EC  81 mg Oral Daily     atorvastatin  40 mg Oral Daily   clopidogrel  75 mg Oral Daily   folic acid  1 mg Oral Daily   heparin  5,000 Units Subcutaneous Q8H   hydrALAZINE  50 mg Oral TID   ipratropium-albuterol  3 mL Nebulization TID   [START ON 04/02/2019] isosorbide mononitrate  30 mg Oral QHS   mometasone-formoterol  2 puff Inhalation BID   multivitamin  1 tablet Oral QHS   nicotine  21 mg Transdermal Daily   sodium bicarbonate  650 mg Oral BID   thiamine  100 mg Oral Daily   Continuous Infusions:   LOS: 0 days    Time spent: 35 minutes spent in the coordination of care today.     Jonnie Finner, DO [facility] Pager [phone]  If 7PM-7AM, please contact night-coverage [email or web address] Password [email or web address] 04/01/2019, 2:53 PM

## 2019-04-01 NOTE — Evaluation (Signed)
Physical Therapy Evaluation Patient Details Name: Kari Robinson MRN: [id number] DOB: Jan 08, 1951 Today's Date: 04/01/2019   History of Present Illness  Pt is a 68 yo F with principal problem of leg weakness. She reports 2 falls in the last 3 days and progressive leg jerking. Says she gets thirsty and her legs hurt when she falls. significant PMH olf HTN, CHF, CAD, CKD & COPD on 2L of O2.   Clinical Impression  Arrived with pt in bed reporting she is very thirsty and has a mild HA. Was able to move to seated EOB independently and maintain balance without assist in seated. In attempt to test MMT of LE pt demonstrated a jerking motion through her lower legs- she was able to regain control with cues to take a deep breath and perform purposeful motions, in this case it was a kick of PT hand and return foot to floor. After ambulating approx 40 ft pt reported a quick onset of bilateral leg pain and "this is what I felt like at dialysis before I fell." She was able to walk to chair stopping approx 2 feet from chair and required maxA from PT to stay standing as she became scared and let go of walker and bed rail. Reported feeling much better after she was seated in the chair. Discussed switching to 2-wheeled walker rather than rollator for safety and she agreed. She does not do any regular exercise and says her boyfriend doesn't let her do anything. I would recommend OP PT or HHPT for this patient to work on balance and address general deconditioning after d/c from acute care.     Follow Up Recommendations Outpatient PT    Equipment Recommendations  Rolling walker with 5" wheels    Recommendations for Other Services       Precautions / Restrictions Precautions Precautions: Fall Restrictions Weight Bearing Restrictions: No      Mobility  Bed Mobility Overal bed mobility: Independent                Transfers Overall transfer level: Needs assistance Equipment used: Rolling walker (2  wheeled);1 person hand held assist Transfers: Sit to/from Stand Sit to Stand: Min guard            Ambulation/Gait Ambulation/Gait assistance: Mod assist Gait Distance (Feet): 50 Feet Assistive device: Rolling walker (2 wheeled) Gait Pattern/deviations: Step-through pattern     General Gait Details: slow cadence, let go of the walker multiple times to adjust face mask, able to navigate obstacles without assistance to move walker  Stairs            Wheelchair Mobility    Modified Rankin (Stroke Patients Only)       Balance Overall balance assessment: Mild deficits observed, not formally tested                                           Pertinent Vitals/Pain Pain Assessment: 0-10 Pain Score: 7  Pain Location: legs, after walking, 0/10 prior to walk Pain Descriptors / Indicators: (hurt) Pain Intervention(s): Limited activity within patient's tolerance;Monitored during session    Home Living Family/patient expects to be discharged to:: Private residence Living Arrangements: Alone(boyfriend lives next door) Available Help at Discharge: Family Type of Home: Apartment Home Access: Level entry     Home Layout: One level Home Equipment: Clinical cytogeneticist - 4 wheels  Prior Function Level of Independence: Independent with assistive device(s)         Comments: uses Rollator     Hand Dominance        Extremity/Trunk Assessment   Upper Extremity Assessment Upper Extremity Assessment: Overall WFL for tasks assessed    Lower Extremity Assessment Lower Extremity Assessment: RLE deficits/detail;LLE deficits/detail RLE Deficits / Details: knee extension 5/5, flexion 4-/5, jerking motions noted LLE Deficits / Details: knee extension 4-/5, flexion 3/5    Cervical / Trunk Assessment Cervical / Trunk Assessment: Normal  Communication   Communication: No difficulties  Cognition Arousal/Alertness: Awake/alert Behavior During Therapy:  WFL for tasks assessed/performed Overall Cognitive Status: Within Functional Limits for tasks assessed                                        General Comments      Exercises General Exercises - Lower Extremity Long Arc Quad: AROM;Both;Seated;20 reps(cues for purposeful movemetn)   Assessment/Plan    PT Assessment Patient needs continued PT services  PT Problem List Decreased strength;Decreased safety awareness;Decreased activity tolerance;Decreased balance;Pain       PT Treatment Interventions DME instruction;Therapeutic activities;Gait training;Therapeutic exercise;Patient/family education;Balance training;Functional mobility training    PT Goals (Current goals can be found in the Care Plan section)  Acute Rehab PT Goals Patient Stated Goal: get control of the jerking, go home PT Goal Formulation: With patient Time For Goal Achievement: 04/08/19 Potential to Achieve Goals: Good    Frequency Min 2X/week   Barriers to discharge Other (comment) needs RW    Co-evaluation               AM-PAC PT "6 Clicks" Mobility  Outcome Measure Help needed turning from your back to your side while in a flat bed without using bedrails?: None Help needed moving from lying on your back to sitting on the side of a flat bed without using bedrails?: None Help needed moving to and from a bed to a chair (including a wheelchair)?: A Little Help needed standing up from a chair using your arms (e.g., wheelchair or bedside chair)?: A Little Help needed to walk in hospital room?: A Little Help needed climbing 3-5 steps with a railing? : A Lot 6 Click Score: 19    End of Session Equipment Utilized During Treatment: Oxygen;Gait belt Activity Tolerance: Patient limited by pain;Patient limited by fatigue Patient left: in chair;with chair alarm set;with call bell/phone within reach   PT Visit Diagnosis: Unsteadiness on feet (R26.81);Repeated falls (R29.6);Muscle weakness  (generalized) (M62.81);Difficulty in walking, not elsewhere classified (R26.2)    Time: 4782-9562 PT Time Calculation (min) (ACUTE ONLY): 54 min   Charges:   PT Evaluation $PT Eval Moderate Complexity: 1 Mod PT Treatments $Gait Training: 8-22 mins $Therapeutic Exercise: 8-22 mins        Selinda Eon PT, DPT Acute Rehab [phone]   Selinda Eon 04/01/2019, 10:07 AM

## 2019-04-01 NOTE — ED Notes (Signed)
ED TO INPATIENT HANDOFF REPORT  ED Nurse Name and Phone #:  Patty [phone]  S Name/Age/Gender [name] 68 y.o. female Room/Bed: 025C/025C  Code Status   Code Status: Full Code  Home/SNF/Other Home Patient oriented to: self, place, time and situation Is this baseline? Yes   Triage Complete: Triage complete  Chief Complaint weakness  Triage Note Pt BIB [facility] from dialysis center. Pt complaining of falls x3 following dialysis treatment today. Pt finished treatment today and was walking to waiting room when her legs gave out. Pt was then helped to a chair when her legs gave out twice more.   Allergies Allergies  Allergen Reactions  . Citalopram Hydrobromide Hives  . Morphine And Related Itching    Level of Care/Admitting Diagnosis ED Disposition    ED Disposition Condition [facility] Area: [place] [[id number]]  Level of Care: Telemetry Medical [104]  I expect the patient will be discharged within 24 hours: No (not a candidate for 5C-Observation unit)  Covid Evaluation: Screening Protocol (No Symptoms)  Diagnosis: Leg weakness, bilateral [130865]  Admitting Physician: Ivor Costa [[id number]]  Attending Physician: Ivor Costa [[id number]]  PT Class (Do Not Modify): Observation [104]  PT Acc Code (Do Not Modify): Observation [10022]       B Medical/Surgery History Past Medical History:  Diagnosis Date  . Acute on chronic heart failure ([place])   . Acute respiratory failure with hypoxia ([place])   . Alcohol abuse   . Arthralgia   . CAD (coronary artery disease)    moderate primarily septal perforater  . CHF (congestive heart failure) ([place])   . COPD (chronic obstructive pulmonary disease) ([place])   . Depression   . HTN (hypertension)   . Hypercholesterolemia    primarily ldl-p and small particles  . RAS (renal artery stenosis) ([place]) 2002   by cath tysinger  . Renal disorder   . Smoking    Past Surgical History:  Procedure Laterality Date   . abdominal aortogram     perclose of the right femoral artery  . AV FISTULA PLACEMENT Right 07/11/2018   Procedure: Right arm ARTERIOVENOUS FISTULA CREATION;  Surgeon: [name], MD;  Location: [facility];  Service: Vascular;  Laterality: Right;  . AVF placement Right   . CARDIAC CATHETERIZATION     left heart catheterization.  Coronary cineangiography. Lft ventricular cineangiography.    [name] LUNG SURGERY     due to lung cancer per patient's report  . TUBAL LIGATION       A IV Location/Drains/Wounds Patient Lines/Drains/Airways Status   Active Line/Drains/Airways    Name:   Placement date:   Placement time:   Site:   Days:   Peripheral IV 03/31/19 Left Wrist   03/31/19    1708    Wrist   1   Fistula / Graft Right Forearm Arteriovenous fistula   07/11/18    0839    Forearm   264   Fistula / Graft Right Forearm Arteriovenous fistula   02/24/19    -    Forearm   36   Incision (Closed) 07/11/18 Arm Right   07/11/18    0839     264          Intake/Output Last 24 hours No intake or output data in the 24 hours ending 04/01/19 0108  Labs/Imaging Results for orders placed or performed during the hospital encounter of 03/31/19 (from the past 48 hour(s))  Basic metabolic panel  Status: Abnormal   Collection Time: 03/31/19  5:00 PM  Result Value Ref Range   Sodium 135 135 - 145 mmol/L   Potassium 3.6 3.5 - 5.1 mmol/L   Chloride 92 (L) 98 - 111 mmol/L   CO2 28 22 - 32 mmol/L   Glucose, Bld 104 (H) 70 - 99 mg/dL   BUN 30 (H) 8 - 23 mg/dL   Creatinine, Ser 4.73 (H) 0.44 - 1.00 mg/dL   Calcium 9.1 8.9 - 10.3 mg/dL   GFR calc non Af Amer 9 (L) >60 mL/min   GFR calc Af Amer 10 (L) >60 mL/min   Anion gap 15 5 - 15    Comment: Performed at [facility], [place] [address] [place], [place] [address]  CBC with Differential     Status: Abnormal   Collection Time: 03/31/19  5:00 PM  Result Value Ref Range   WBC 4.9 4.0 - 10.5 K/uL   RBC 3.48 (L) 3.87 - 5.11 MIL/uL   Hemoglobin  11.5 (L) 12.0 - 15.0 g/dL   HCT 36.2 36.0 - 46.0 %   MCV 104.0 (H) 80.0 - 100.0 fL   MCH 33.0 26.0 - 34.0 pg   MCHC 31.8 30.0 - 36.0 g/dL   RDW 18.5 (H) 11.5 - 15.5 %   Platelets 157 150 - 400 K/uL   nRBC 0.4 (H) 0.0 - 0.2 %   Neutrophils Relative % 67 %   Neutro Abs 3.3 1.7 - 7.7 K/uL   Lymphocytes Relative 15 %   Lymphs Abs 0.8 0.7 - 4.0 K/uL   Monocytes Relative 15 %   Monocytes Absolute 0.7 0.1 - 1.0 K/uL   Eosinophils Relative 3 %   Eosinophils Absolute 0.2 0.0 - 0.5 K/uL   Basophils Relative 0 %   Basophils Absolute 0.0 0.0 - 0.1 K/uL   Immature Granulocytes 0 %   Abs Immature Granulocytes 0.02 0.00 - 0.07 K/uL    Comment: Performed at [facility], [address], [place], [place] [address]  Magnesium     Status: None   Collection Time: 03/31/19  5:00 PM  Result Value Ref Range   Magnesium 2.4 1.7 - 2.4 mg/dL    Comment: Performed at [facility], [place] [address] [place], [place] [address]   Mr Cervical Spine Wo Contrast  Result Date: 03/31/2019 CLINICAL DATA:  Initial evaluation for progressive extremity weakness with jerking for 3 days. EXAM: MRI CERVICAL SPINE WITHOUT CONTRAST TECHNIQUE: Multiplanar, multisequence MR imaging of the cervical spine was performed. No intravenous contrast was administered. COMPARISON:  Prior CT from 02/24/2019. FINDINGS: Alignment: Mild straightening of the normal cervical lordosis. No listhesis. Vertebrae: Vertebral body heights maintained without evidence for acute or chronic fracture. Bone marrow signal intensity within normal limits. No discrete or worrisome osseous lesions. No abnormal marrow edema. Cord: Signal intensity within the cervical spinal cord is normal. Posterior Fossa, vertebral arteries, paraspinal tissues: Visualized brain and posterior fossa demonstrates no acute finding. Craniocervical junction normal. Paraspinous and prevertebral soft tissues within normal limits. Loss of normal flow void within the hypoplastic  left vertebral artery, which could be related to slow flow and/or occlusion. Normal flow void seen within the dominant right vertebral artery. Disc levels: C2-C3: Mild disc bulge with uncovertebral hypertrophy. No significant canal or foraminal stenosis. C3-C4: Diffuse disc bulge with uncovertebral hypertrophy. Flattening of the ventral thecal sac without significant spinal stenosis. Foramina remain patent. C4-C5: Circumferential disc osteophyte with intervertebral disc space narrowing. Broad  posterior component flattens and effaces the ventral thecal sac. Resultant mild to moderate spinal stenosis without cord deformity. Severe bilateral C5 foraminal narrowing, left greater than right. C5-C6: Circumferential disc osteophyte with intervertebral disc space narrowing. Flattening and partial effacement of the ventral thecal sac with resultant mild to moderate spinal stenosis. No cord deformity. Severe left with moderate right C6 foraminal stenosis. C6-C7: Circumferential disc osteophyte with intervertebral disc space narrowing. Broad posterior component, asymmetric to the left, flattens and partially indents the ventral thecal sac, greater on the left. Mild to moderate spinal stenosis without cord deformity. Moderate bilateral C7 foraminal stenosis. C7-T1:  Mild facet hypertrophy.  No stenosis. Visualized upper thoracic spine demonstrates no significant finding. IMPRESSION: 1. No acute abnormality within the cervical spine. Normal MRI appearance of the cervical spinal cord without cord impingement. 2. 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 3. Multifactorial degenerative changes with resultant multilevel foraminal narrowing as above. Notable findings include severe bilateral C5 foraminal stenosis, severe left with moderate right C6 foraminal narrowing, with moderate bilateral C7 foraminal stenosis. 4. Loss of normal flow void within the diminutive left  vertebral artery, which could be related to slow flow and/or occlusion. Electronically Signed   By: Jeannine Boga M.D.   On: 03/31/2019 19:50   Dg Chest Port 1 View  Result Date: 03/31/2019 CLINICAL DATA:  Weakness EXAM: PORTABLE CHEST 1 VIEW COMPARISON:  02/27/2000 FINDINGS: The heart size and mediastinal contours are within normal limits. Unchanged elevation of the left hemidiaphragm. The visualized skeletal structures are unremarkable. IMPRESSION: No acute abnormality of the lungs in AP portable projection. Unchanged elevation of the left hemidiaphragm. Electronically Signed   By: Eddie Candle M.D.   On: 03/31/2019 17:56    Pending Labs Unresulted Labs (From admission, onward)    Start     Ordered   04/01/19 0500  Hemoglobin A1c  Tomorrow morning,   R     04/01/19 0050   04/01/19 0500  Lipid panel  Tomorrow morning,   R    Comments:  Fasting    04/01/19 0050          Vitals/Pain Today's Vitals   03/31/19 2211 03/31/19 2230 03/31/19 2330 03/31/19 2352  BP:  (!) 152/102 (!) 140/108   Pulse:  99 89   Resp:  (!) 24 18   Temp:      TempSrc:      SpO2:  95% 94%   Weight:      Height:      PainSc: 0-No pain   0-No pain    Isolation Precautions No active isolations  Medications Medications  nicotine (NICODERM CQ - dosed in mg/24 hours) patch 21 mg (has no administration in time range)   stroke: mapping our early stages of recovery book (has no administration in time range)  acetaminophen (TYLENOL) tablet 650 mg (has no administration in time range)    Or  acetaminophen (TYLENOL) solution 650 mg (has no administration in time range)    Or  acetaminophen (TYLENOL) suppository 650 mg (has no administration in time range)  senna-docusate (Senokot-S) tablet 1 tablet (has no administration in time range)  heparin injection 5,000 Units (has no administration in time range)    Mobility walks with device High fall risk   Focused Assessments Cardiac Assessment  Handoff:    Lab Results  Component Value Date   TROPONINI 0.04 (HH) 02/27/2019   No results found for: DDIMER Does the Patient currently  have chest pain? No     R Recommendations: See Admitting Provider Note  Report given to:   Additional Notes:

## 2019-04-02 DIAGNOSIS — R531 Weakness: Secondary | ICD-10-CM | POA: Diagnosis not present

## 2019-04-02 MED ORDER — HYDROCODONE-ACETAMINOPHEN 5-325 MG PO TABS: 1.0000 | ORAL_TABLET | Freq: Three times a day (TID) | ORAL | 0 refills | Status: AC | PRN

## 2019-04-02 MED ORDER — HYDRALAZINE HCL 50 MG PO TABS: 50.0000 mg | ORAL_TABLET | Freq: Three times a day (TID) | ORAL | 0 refills | Status: DC

## 2019-04-02 NOTE — Progress Notes (Signed)
CSW provided a cab voucher to the RN so that the patient could discharge home safely. CSW signing off, no further intervention is required at this time.   Domenic Schwab, MSW, [place]

## 2019-04-02 NOTE — Care Management Obs Status (Signed)
[facility] NOTIFICATION   Patient Details  Name: Kari Robinson MRN: [id number] Date of Birth: 1951/07/10   Medicare Observation Status Notification Given:  Yes    [place], [place] 04/02/2019, 12:25 PM

## 2019-04-02 NOTE — Progress Notes (Signed)
Occupational Therapy Treatment Patient Details Name: Kari Robinson MRN: [id number] DOB: 25-Jul-1951 Today's Date: 04/02/2019    History of present illness Pt is a 68 yo F with principal problem of leg weakness. She reports 2 falls in the last 3 days and progressive leg jerking. Says she gets thirsty and her legs hurt when she falls.  Work up underway.  significant PMH olf HTN, CHF, CAD, CKD & COPD on 2L of O2.    OT comments  Pt instructed in back precautions with handout provided.  She was able to perform ADLs with min guard assist today with only one brief episode of leg tremors which she was able to control with stress reducing techniques.  She was provided with a reacher.  She is eager to discharge and states she will discharge to boyfriend's home, but feels confident with her decision.   Follow Up Recommendations  Home health OT;Other (comment)    Equipment Recommendations  None recommended by OT    Recommendations for Other Services      Precautions / Restrictions Precautions Precautions: Fall       Mobility Bed Mobility Overal bed mobility: Modified Independent             General bed mobility comments: Pt instructed in log rolling   Transfers Overall transfer level: Needs assistance Equipment used: Rolling walker (2 wheeled);1 person hand held assist Transfers: Sit to/from Stand Sit to Stand: Min guard Stand pivot transfers: Min assist       General transfer comment: Pt with only one brief episode of legs bouncing/trembling today. Reiforced deep breathing technique which seemed to help.      Balance Overall balance assessment: Needs assistance Sitting-balance support: No upper extremity supported Sitting balance-Leahy Scale: Good     Standing balance support: Bilateral upper extremity supported Standing balance-Leahy Scale: Poor Standing balance comment: requires UE support                            ADL either performed or assessed with  clinical judgement   ADL Overall ADL's : Needs assistance/impaired     Grooming: Wash/dry hands;Wash/dry face;Brushing hair;Min guard;Standing               Lower Body Dressing: Minimal assistance;Sit to/from stand Lower Body Dressing Details (indicate cue type and reason): reinforced figure 4 position for LB ADLs  Toilet Transfer: Min guard;Ambulation;Comfort height toilet;Grab bars;RW   Toileting- Water quality scientist and Hygiene: Min guard;Sit to/from stand       Functional mobility during ADLs: Min guard;Rolling walker General ADL Comments: Pt instructed in back precautions to reduce pain and to protect her back      Vision       Perception     Praxis      Cognition Arousal/Alertness: Awake/alert Behavior During Therapy: WFL for tasks assessed/performed Overall Cognitive Status: Within Functional Limits for tasks assessed                                 General Comments: Pt reports baseline memory deficits         Exercises     Shoulder Instructions       General Comments Pt reports she is discharging to her boyfriend's home, but states she feels comfortable with the situation. Discussed need for follow up conseling     Pertinent Vitals/ Pain  Pain Assessment: No/denies pain  Home Living                                          Prior Functioning/Environment              Frequency  Min 2X/week        Progress Toward Goals  OT Goals(current goals can now be found in the care plan section)  Progress towards OT goals: Progressing toward goals     Plan Discharge plan remains appropriate    Co-evaluation                 AM-PAC OT "6 Clicks" Daily Activity     Outcome Measure   Help from another person eating meals?: None Help from another person taking care of personal grooming?: A Little Help from another person toileting, which includes using toliet, bedpan, or urinal?: A Little Help  from another person bathing (including washing, rinsing, drying)?: A Little Help from another person to put on and taking off regular upper body clothing?: None Help from another person to put on and taking off regular lower body clothing?: A Little 6 Click Score: 20    End of Session Equipment Utilized During Treatment: Gait belt;Rolling walker  OT Visit Diagnosis: Unsteadiness on feet (R26.81)   Activity Tolerance Patient tolerated treatment well   Patient Left in bed;with call bell/phone within reach   Nurse Communication Mobility status        Time: 7741-4239 OT Time Calculation (min): 24 min  Charges: OT General Charges $OT Visit: 1 Visit OT Treatments $Self Care/Home Management : 23-37 mins  Lucille Passy, OTR/L Acute Rehabilitation Services Pager [phone] Office [phone]    Lucille Passy M 04/02/2019, 5:16 PM

## 2019-04-02 NOTE — Consult Note (Signed)
Chief Complaint   Chief Complaint  Patient presents with   Fall   Weakness    HPI   Consult requested by: Dr Marylyn Ishihara Reason for consult: Lumbar spinal stenosis, BLE numbness/tingling and weakness  HPI: Marietta Sikkema is a 68 y.o. 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 An MRI of her lumbar spine was ordered revealing severe lumbar spinal stenosis. A neurosurgical consultation was requested.   Ms Wesolowski reports after dialysis feeling as though her legs went numb causing her to fall. She required assistance getting up but unfortunately fell again prompting ER visit. She reports a long standing history of chronic low back pain stemming back several months-years without any known injury. LBP is associated with bilateral hamstring pain and N/T in same distribution. Pain and N/T never goes beyond hamstrings. Pain in BLE typically brought on immediately by ambulation. Associated with weakness in BLE causing legs to feel like they are going to give out. When she sits, the pain resolves. While sitting, she only has lower back pain. No bowel or bladder dysfunction. Endorses numbness in bilateral hands, but denies weakness in BUE.  Important to note, patient was also complaining of BLE tremors. It was determined that her symptoms were most consistent with a psychogenic process.    Patient Active Problem List   Diagnosis Date Noted   Fall 04/01/2019   Leg weakness, bilateral 04/01/2019   Tobacco abuse 04/01/2019   Alcohol abuse 04/01/2019   Transient loss of consciousness 02/24/2019   ESRD on dialysis ([place]) 02/24/2019   Seizure-like activity ([place]) 02/24/2019   NSTEMI (non-ST elevated myocardial infarction) ([facility]) 02/24/2019   Ischemic cardiomyopathy 02/24/2019   Hypertensive  emergency 09/30/2018   Type II diabetes mellitus with renal manifestations ([place]) 09/30/2018   HLD (hyperlipidemia) 09/30/2018   Depression with anxiety 09/30/2018   CKD (chronic kidney disease), stage V ([facility]) 09/30/2018   Acute on chronic respiratory failure with hypoxia ([place]) 09/30/2018   Fluid overload 09/30/2018   CAD (coronary artery disease) 09/30/2018   Anemia in CKD (chronic kidney disease) 09/30/2018   COPD (chronic obstructive pulmonary disease) ([name])    Acute on chronic combined systolic and diastolic CHF (congestive heart failure) ([facility])    Solitary right kidney 56/43/3295   Chronic systolic CHF (congestive heart failure) ([facility]) 06/24/2018   Acute CHF (congestive heart failure) ([facility]) 06/07/2018   Hypertensive emergency 06/07/2018   CKD (chronic kidney disease) stage 5, GFR less than 15 ml/min ([name]) 06/07/2018   Normocytic normochromic anemia 06/07/2018   DM (diabetes mellitus), type 2 with renal complications ([facility]) 18/84/1660   Hypertensive urgency 06/07/2018   Occlusion and stenosis of carotid artery without mention of cerebral infarction 01/19/2012   RENAL ARTERY STENOSIS 09/10/2010   ABDOMINAL BRUIT 08/12/2010   DEPRESSION 08/11/2010   PERIPHERAL VASCULAR DISEASE 08/11/2010   Essential hypertension 08/08/2010   Coronary atherosclerosis 08/08/2010   ARTHRALGIA 08/08/2010    PMH: Past Medical History:  Diagnosis Date   Acute on chronic heart failure ([place])    Acute respiratory failure with hypoxia ([name])    Alcohol abuse    Arthralgia    CAD (coronary artery disease)    moderate primarily septal perforater   CHF (congestive heart failure) ([name])    COPD (chronic obstructive pulmonary disease) ([facility])    Depression  HTN (hypertension)    Hypercholesterolemia    primarily ldl-p and small particles   RAS (renal artery stenosis) ([facility]) 2002   by cath tysinger   Renal disorder    Smoking     PSH: Past Surgical History:    Procedure Laterality Date   abdominal aortogram     perclose of the right femoral artery   AV FISTULA PLACEMENT Right 07/11/2018   Procedure: Right arm ARTERIOVENOUS FISTULA CREATION;  Surgeon: [name], MD;  Location: [facility];  Service: Vascular;  Laterality: Right;   AVF placement Right    CARDIAC CATHETERIZATION     left heart catheterization.  Coronary cineangiography. Lft ventricular cineangiography.     LUNG SURGERY     due to lung cancer per patient's report   TUBAL LIGATION      Medications Prior to Admission  Medication Sig Dispense Refill Last Dose   albuterol (PROVENTIL HFA;VENTOLIN HFA) 108 (90 Base) MCG/ACT inhaler Inhale 2 puffs into the lungs every 4 (four) hours as needed for wheezing.    Past Month at Unknown time   albuterol (PROVENTIL) (2.5 MG/3ML) 0.083% nebulizer solution Take 2.5 mg by nebulization every 6 (six) hours as needed for wheezing or shortness of breath.   1 Past Month at Unknown time   aspirin EC 81 MG tablet Take 81 mg by mouth daily.   Past Week at Unknown time   atorvastatin (LIPITOR) 40 MG tablet Take 40 mg by mouth daily.   Past Week at Unknown time   b complex-vitamin c-folic acid (NEPHRO-VITE) 0.8 MG TABS tablet Take 1 tablet by mouth See admin instructions. Take one tablet by mouth on Sunday, Tuesday, Thursday, Saturday mornings. Take one tablet after dialysis on Monday, Wednesday, Friday   Past Week at Unknown time   budesonide-formoterol (SYMBICORT) 160-4.5 MCG/ACT inhaler Inhale 2 puffs into the lungs 2 (two) times daily.   Past Week at Unknown time   clopidogrel (PLAVIX) 75 MG tablet Take 75 mg by mouth daily.   Past Week at Unknown time   diclofenac sodium (VOLTAREN) 1 % GEL Apply 2 g topically 4 (four) times daily. 100 g 0 Past Week at Unknown time   HYDROcodone-acetaminophen (NORCO/VICODIN) 5-325 MG tablet Take 1 tablet by mouth every 6 (six) hours as needed for up to 12 doses for severe pain. 12 tablet 0 Past Week at  Unknown time   isosorbide mononitrate (IMDUR) 30 MG 24 hr tablet Take 30 mg by mouth at bedtime.    Past Week at Unknown time   OXYGEN Inhale 2 L into the lungs continuous.   Past Week at Unknown time   hydrALAZINE (APRESOLINE) 100 MG tablet Take 1 tablet (100 mg total) by mouth 3 (three) times daily. (Patient not taking: Reported on 02/25/2019) 90 tablet 0 Not Taking at Unknown time   ipratropium-albuterol (DUONEB) 0.5-2.5 (3) MG/3ML SOLN Take 3 mLs by nebulization every 6 (six) hours as needed. (Patient not taking: Reported on 02/25/2019) 360 mL 0 Not Taking at Unknown time   sodium bicarbonate 650 MG tablet Take 1 tablet (650 mg total) by mouth 2 (two) times daily. (Patient not taking: Reported on 02/25/2019) 60 tablet 0 Not Taking at Unknown time   torsemide (DEMADEX) 20 MG tablet Take 2 tablets (40 mg total) by mouth daily. (Patient not taking: Reported on 02/25/2019) 60 tablet 0 Not Taking at Unknown time    SH: Social History   Tobacco Use   Smoking status: Current Every Day Smoker  Years: 35.00    Types: Cigarettes   Smokeless tobacco: Never Used   Tobacco comment: 3-4 cigarettes per day  Substance Use Topics   Alcohol use: Never    Frequency: Never    Comment: states has quit drinking "a few months ago"   Drug use: Never    MEDS: Prior to Admission medications   Medication Sig Start Date End Date Taking? Authorizing Provider  albuterol (PROVENTIL HFA;VENTOLIN HFA) 108 (90 Base) MCG/ACT inhaler Inhale 2 puffs into the lungs every 4 (four) hours as needed for wheezing.  11/25/17  Yes [provider]  albuterol (PROVENTIL) (2.5 MG/3ML) 0.083% nebulizer solution Take 2.5 mg by nebulization every 6 (six) hours as needed for wheezing or shortness of breath.  07/28/18  Yes [provider]  aspirin EC 81 MG tablet Take 81 mg by mouth daily.   Yes [provider]  atorvastatin (LIPITOR) 40 MG tablet Take 40 mg by mouth daily.   Yes [provider]  b complex-vitamin c-folic acid (NEPHRO-VITE) 0.8 MG TABS tablet Take 1 tablet by mouth See admin instructions. Take one tablet by mouth on Sunday, Tuesday, Thursday, Saturday mornings. Take one tablet after dialysis on Monday, Wednesday, Friday 02/06/19  Yes [provider]  budesonide-formoterol (SYMBICORT) 160-4.5 MCG/ACT inhaler Inhale 2 puffs into the lungs 2 (two) times daily.   Yes [provider]  clopidogrel (PLAVIX) 75 MG tablet Take 75 mg by mouth daily.   Yes [provider]  diclofenac sodium (VOLTAREN) 1 % GEL Apply 2 g topically 4 (four) times daily. 02/27/19  Yes Hedges, Dellis Filbert, PA-C  HYDROcodone-acetaminophen (NORCO/VICODIN) 5-325 MG tablet Take 1 tablet by mouth every 6 (six) hours as needed for up to 12 doses for severe pain. 02/26/19  Yes Antonieta Pert, MD  isosorbide mononitrate (IMDUR) 30 MG 24 hr tablet Take 30 mg by mouth at bedtime.    Yes [provider]  OXYGEN Inhale 2 L into the lungs continuous.   Yes [provider]  hydrALAZINE (APRESOLINE) 100 MG tablet Take 1 tablet (100 mg total) by mouth 3 (three) times daily. Patient not taking: Reported on 02/25/2019 10/01/18 02/24/19  Purohit, Konrad Dolores, MD  ipratropium-albuterol (DUONEB) 0.5-2.5 (3) MG/3ML SOLN Take 3 mLs by nebulization every 6 (six) hours as needed. Patient not taking: Reported on 02/25/2019 06/10/18   Dana Allan I, MD  sodium bicarbonate 650 MG tablet Take 1 tablet (650 mg total) by mouth 2 (two) times daily. Patient not taking: Reported on 02/25/2019 06/09/18   Dana Allan I, MD  torsemide (DEMADEX) 20 MG tablet Take 2 tablets (40 mg total) by mouth daily. Patient not taking: Reported on 02/25/2019 06/10/18   Dana Allan I, MD    ALLERGY: Allergies  Allergen Reactions   Citalopram Hydrobromide Hives   Morphine And Related Itching    Social History   Tobacco Use   Smoking status: Current Every Day Smoker    Years: 35.00     Types: Cigarettes   Smokeless tobacco: Never Used   Tobacco comment: 3-4 cigarettes per day  Substance Use Topics   Alcohol use: Never    Frequency: Never    Comment: states has quit drinking "a few months ago"     Family History  Problem Relation Age of Onset   Emphysema Father      ROS   Review of Systems  Constitutional: Negative.   HENT: Negative.   Eyes: Negative.   Respiratory: Negative.   Cardiovascular: Negative.  Gastrointestinal: Negative.   Genitourinary: Negative.   Musculoskeletal: Positive for back pain and myalgias. Negative for falls, joint pain and neck pain.  Neurological: Positive for tingling and weakness. Negative for dizziness, tremors, sensory change, speech change, focal weakness, seizures, loss of consciousness and headaches.    Exam   Vitals:   04/02/19 0848 04/02/19 1049  BP:  122/78  Pulse:  (!) 51  Resp:  20  Temp:  97.8 F (36.6 C)  SpO2: 96% 96%   General appearance: WDWN, NAD, resting comofrtably Eyes: No scleral injection Cardiovascular: Regular rate and rhythm without murmurs, rubs, gallops. No edema or variciosities. Distal pulses normal. Pulmonary: Effort normal, non-labored breathing Musculoskeletal:     Muscle tone upper extremities: Normal    Muscle tone lower extremities: Normal    Motor exam: Upper Extremities Deltoid Bicep Tricep Grip  Right 5/5 5/5 5/5 5/5  Left 5/5 5/5 5/5 5/5   Lower Extremity IP Quad PF DF EHL  Right 5/5 5/5 5/5 5/5 5/5  Left 5/5 5/5 5/5 5/5 4/5   Neurological Mental Status:    - Patient is awake, alert, oriented to person, place, month, year, and situation    - Patient is able to give a clear and coherent history.    - No signs of aphasia or neglect Cranial Nerves    - II: Visual Fields are full. PERRL    - III/IV/VI: EOMI without ptosis or diploplia.     - V: Facial sensation is grossly normal    - VII: Facial movement is symmetric.     - VIII: hearing is intact to voice    - X:  Uvula elevates symmetrically    - XI: Shoulder shrug is symmetric.    - XII: tongue is midline without atrophy or fasciculations.  Sensory: Sensation grossly intact to LT Deep Tendon Reflexes    - 1+ and symmetric in the patellae.    Results - Imaging/Labs   Results for orders placed or performed during the hospital encounter of 03/31/19 (from the past 48 hour(s))  Basic metabolic panel     Status: Abnormal   Collection Time: 03/31/19  5:00 PM  Result Value Ref Range   Sodium 135 135 - 145 mmol/L   Potassium 3.6 3.5 - 5.1 mmol/L   Chloride 92 (L) 98 - 111 mmol/L   CO2 28 22 - 32 mmol/L   Glucose, Bld 104 (H) 70 - 99 mg/dL   BUN 30 (H) 8 - 23 mg/dL   Creatinine, Ser 4.73 (H) 0.44 - 1.00 mg/dL   Calcium 9.1 8.9 - 10.3 mg/dL   GFR calc non Af Amer 9 (L) >60 mL/min   GFR calc Af Amer 10 (L) >60 mL/min   Anion gap 15 5 - 15    Comment: Performed at [facility], [address] [place], [place] [address]  CBC with Differential     Status: Abnormal   Collection Time: 03/31/19  5:00 PM  Result Value Ref Range   WBC 4.9 4.0 - 10.5 K/uL   RBC 3.48 (L) 3.87 - 5.11 MIL/uL   Hemoglobin 11.5 (L) 12.0 - 15.0 g/dL   HCT 36.2 36.0 - 46.0 %   MCV 104.0 (H) 80.0 - 100.0 fL   MCH 33.0 26.0 - 34.0 pg   MCHC 31.8 30.0 - 36.0 g/dL   RDW 18.5 (H) 11.5 - 15.5 %   Platelets 157 150 - 400 K/uL   nRBC 0.4 (H) 0.0 - 0.2 %  Neutrophils Relative % 67 %   Neutro Abs 3.3 1.7 - 7.7 K/uL   Lymphocytes Relative 15 %   Lymphs Abs 0.8 0.7 - 4.0 K/uL   Monocytes Relative 15 %   Monocytes Absolute 0.7 0.1 - 1.0 K/uL   Eosinophils Relative 3 %   Eosinophils Absolute 0.2 0.0 - 0.5 K/uL   Basophils Relative 0 %   Basophils Absolute 0.0 0.0 - 0.1 K/uL   Immature Granulocytes 0 %   Abs Immature Granulocytes 0.02 0.00 - 0.07 K/uL    Comment: Performed at [facility] [address] [place], [place] [address]  Magnesium     Status: None   Collection Time: 03/31/19  5:00 PM  Result Value Ref  Range   Magnesium 2.4 1.7 - 2.4 mg/dL    Comment: Performed at [facility], [place] [address], [place], [place] [address]  SARS Coronavirus 2 (CEPHEID - Performed in [place] hospital lab), Hosp Order     Status: None   Collection Time: 04/01/19  1:25 AM  Result Value Ref Range   SARS Coronavirus 2 NEGATIVE NEGATIVE    Comment: (NOTE) If result is NEGATIVE SARS-CoV-2 target nucleic acids are NOT DETECTED. The SARS-CoV-2 RNA is generally detectable in upper and lower  respiratory specimens during the acute phase of infection. The lowest  concentration of SARS-CoV-2 viral copies this assay can detect is 250  copies / mL. A negative result does not preclude SARS-CoV-2 infection  and should not be used as the sole basis for treatment or other  patient management decisions.  A negative result may occur with  improper specimen collection / handling, submission of specimen other  than nasopharyngeal swab, presence of viral mutation(s) within the  areas targeted by this assay, and inadequate number of viral copies  (<250 copies / mL). A negative result must be combined with clinical  observations, patient history, and epidemiological information. If result is POSITIVE SARS-CoV-2 target nucleic acids are DETECTED. The SARS-CoV-2 RNA is generally detectable in upper and lower  respiratory specimens dur ing the acute phase of infection.  Positive  results are indicative of active infection with SARS-CoV-2.  Clinical  correlation with patient history and other diagnostic information is  necessary to determine patient infection status.  Positive results do  not rule out bacterial infection or co-infection with other viruses. If result is PRESUMPTIVE POSTIVE SARS-CoV-2 nucleic acids MAY BE PRESENT.   A presumptive positive result was obtained on the submitted specimen  and confirmed on repeat testing.  While 2019 novel coronavirus  (SARS-CoV-2) nucleic acids may be present in the submitted  sample  additional confirmatory testing may be necessary for epidemiological  and / or clinical management purposes  to differentiate between  SARS-CoV-2 and other Sarbecovirus currently known to infect humans.  If clinically indicated additional testing with an alternate test  methodology [phone]) is advised. The SARS-CoV-2 RNA is generally  detectable in upper and lower respiratory sp ecimens during the acute  phase of infection. The expected result is Negative. Fact Sheet for Patients:  StrictlyIdeas.no Fact Sheet for Healthcare Providers: BankingDealers.co.za This test is not yet approved or cleared by the [place] FDA and has been authorized for detection and/or diagnosis of SARS-CoV-2 by FDA under an Emergency Use Authorization (EUA).  This EUA will remain in effect (meaning this test can be used) for the duration of the COVID-19 declaration under Section 564(b)(1) of the Act, 21 U.S.C. section 360bbb-3(b)(1), unless the authorization  is terminated or revoked sooner. Performed at [facility], [place] [address], [place], [place] [address]   Hemoglobin A1c     Status: None   Collection Time: 04/01/19  4:48 AM  Result Value Ref Range   Hgb A1c MFr Bld 4.9 4.8 - 5.6 %    Comment: (NOTE) Pre diabetes:          5.7%-6.4% Diabetes:              >6.4% Glycemic control for   <7.0% adults with diabetes    Mean Plasma Glucose 93.93 mg/dL    Comment: Performed at [facility] [address], [place], [place] [address]  Lipid panel     Status: None   Collection Time: 04/01/19  4:48 AM  Result Value Ref Range   Cholesterol 125 0 - 200 mg/dL   Triglycerides 76 <150 mg/dL   HDL 51 >40 mg/dL   Total CHOL/HDL Ratio 2.5 RATIO   VLDL 15 0 - 40 mg/dL   LDL Cholesterol 59 0 - 99 mg/dL    Comment:        Total Cholesterol/HDL:CHD Risk Coronary Heart Disease Risk Table                     Men   Women  1/2 Average Risk   3.4    3.3  Average Risk       5.0   4.4  2 X Average Risk   9.6   7.1  3 X Average Risk  23.4   11.0        Use the calculated Patient Ratio above and the CHD Risk Table to determine the patient's CHD Risk.        ATP III CLASSIFICATION (LDL):  <100     mg/dL   Optimal  100-129  mg/dL   Near or Above                    Optimal  130-159  mg/dL   Borderline  160-189  mg/dL   High  >190     mg/dL   Very High Performed at [facility] [address], [place], [place] [address]   Glucose, capillary     Status: Abnormal   Collection Time: 04/01/19  6:06 AM  Result Value Ref Range   Glucose-Capillary 107 (H) 70 - 99 mg/dL  TSH     Status: None   Collection Time: 04/01/19 10:52 AM  Result Value Ref Range   TSH 0.749 0.350 - 4.500 uIU/mL    Comment: Performed by a 3rd Generation assay with a functional sensitivity of <=0.01 uIU/mL. Performed at [facility], [place] [address] [place], [place] [address]   Vitamin B12     Status: None   Collection Time: 04/01/19 10:52 AM  Result Value Ref Range   Vitamin B-12 535 180 - 914 pg/mL    Comment: (NOTE) This assay is not validated for testing neonatal or myeloproliferative syndrome specimens for Vitamin B12 levels. Performed at [facility], [place] [address], [place],  [address]     Mr Brain 36 Contrast  Result Date: 04/01/2019 CLINICAL DATA:  Focal neuro deficit, suspect stroke EXAM: MRI HEAD WITHOUT CONTRAST TECHNIQUE: Multiplanar, multiecho pulse sequences of the brain and surrounding structures were obtained without intravenous contrast. COMPARISON:  MRI head 02/25/2019 FINDINGS: Brain: Negative for acute infarct. 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 Chronic ischemic change in the white matter. Ventricle size normal. Negative for mass. Vascular: Loss of flow void in the distal left vertebral artery is unchanged compatible with chronic occlusion. Remaining flow voids  intact Skull and upper cervical spine: Negative Sinuses/Orbits: Negative Other: None IMPRESSION: Negative for acute infarct.  Chronic ischemia as above. Electronically Signed   By: Franchot Gallo M.D.   On: 04/01/2019 16:29   Mr Cervical Spine Wo Contrast  Result Date: 03/31/2019 CLINICAL DATA:  Initial evaluation for progressive extremity weakness with jerking for 3 days. EXAM: MRI CERVICAL SPINE WITHOUT CONTRAST TECHNIQUE: Multiplanar, multisequence MR imaging of the cervical spine was performed. No intravenous contrast was administered. COMPARISON:  Prior CT from 02/24/2019. FINDINGS: Alignment: Mild straightening of the normal cervical lordosis. No listhesis. Vertebrae: Vertebral body heights maintained without evidence for acute or chronic fracture. Bone marrow signal intensity within normal limits. No discrete or worrisome osseous lesions. No abnormal marrow edema. Cord: Signal intensity within the cervical spinal cord is normal. Posterior Fossa, vertebral arteries, paraspinal tissues: Visualized brain and posterior fossa demonstrates no acute finding. Craniocervical junction normal. Paraspinous and prevertebral soft tissues within normal limits. Loss of normal flow void within the hypoplastic left vertebral artery, which could be related to slow flow and/or occlusion. Normal flow void seen within the dominant right vertebral artery. Disc levels: C2-C3: Mild disc bulge with uncovertebral hypertrophy. No significant canal or foraminal stenosis. C3-C4: Diffuse disc bulge with uncovertebral hypertrophy. Flattening of the ventral thecal sac without significant spinal stenosis. Foramina remain patent. C4-C5: Circumferential disc osteophyte with intervertebral disc space narrowing. Broad posterior component flattens and effaces the ventral thecal sac. Resultant mild to moderate spinal stenosis without cord deformity. Severe bilateral C5 foraminal narrowing, left greater than right. C5-C6: Circumferential disc  osteophyte with intervertebral disc space narrowing. Flattening and partial effacement of the ventral thecal sac with resultant mild to moderate spinal stenosis. No cord deformity. Severe left with moderate right C6 foraminal stenosis. C6-C7: Circumferential disc osteophyte with intervertebral disc space narrowing. Broad posterior component, asymmetric to the left, flattens and partially indents the ventral thecal sac, greater on the left. Mild to moderate spinal stenosis without cord deformity. Moderate bilateral C7 foraminal stenosis. C7-T1:  Mild facet hypertrophy.  No stenosis. Visualized upper thoracic spine demonstrates no significant finding. IMPRESSION: 1. No acute abnormality within the cervical spine. Normal MRI appearance of the cervical spinal cord without cord impingement. 2. 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 3. Multifactorial degenerative changes with resultant multilevel foraminal narrowing as above. Notable findings include severe bilateral C5 foraminal stenosis, severe left with moderate right C6 foraminal narrowing, with moderate bilateral C7 foraminal stenosis. 4. Loss of normal flow void within the diminutive left vertebral artery, which could be related to slow flow and/or occlusion. Electronically Signed   By: Jeannine Boga M.D.   On: 03/31/2019 19:50   Mr Lumbar Spine Wo Contrast  Result Date: 04/01/2019 CLINICAL DATA:  Lumbar radiculopathy EXAM: MRI LUMBAR SPINE WITHOUT CONTRAST TECHNIQUE: Multiplanar, multisequence MR imaging of the lumbar spine was performed. No intravenous contrast was administered. COMPARISON:  None. FINDINGS: Segmentation:  Normal Alignment:  Normal Vertebrae:  Negative for fracture or mass.  Normal bone marrow. Conus medullaris and cauda equina: Conus extends to the L1 level. Conus and cauda equina appear normal. Paraspinal and other soft tissues: Bilateral renal cyst. No retroperitoneal  mass. Upper abdominal aorta measures up to 3.7 cm in diameter. Distal  abdominal aorta measures 2.0 cm in diameter. Disc levels: L1-2: Mild disc bulging and spurring without stenosis L2-3: Small central disc protrusion with mild disc bulging. Negative for stenosis L3-4: Small broad-based disc protrusion centrally. Diffuse bulging of the disc with moderate facet hypertrophy. Mild spinal stenosis and mild to moderate subarticular stenosis bilaterally L4-5: Severe spinal stenosis. Moderately large central disc protrusion eccentric to the right. Marked facet and ligamentum flavum hypertrophy. Severe subarticular stenosis bilaterally L5-S1: Disc degeneration with diffuse endplate spurring. Moderate subarticular stenosis bilaterally. IMPRESSION: Mild spinal stenosis L3-4 with mild to moderate subarticular stenosis bilaterally. Severe spinal stenosis L4-5 with central disc protrusion. Severe subarticular stenosis bilaterally Moderate subarticular stenosis bilaterally L5-S1 due to spurring. Abdominal aortic aneurysm. Upper abdominal aorta 3.7 cm in diameter. Distal abdominal aorta 2.0 cm in diameter. Recommend followup by ultrasound in 2 years. This recommendation follows ACR consensus guidelines: White Paper of the ACR Incidental Findings Committee II on Vascular Findings. J Am Coll Radiol 2013; 10:789-794. Aortic aneurysm NOS (ICD10-I71.9) Electronically Signed   By: Franchot Gallo M.D.   On: 04/01/2019 16:46   Dg Chest Port 1 View  Result Date: 03/31/2019 CLINICAL DATA:  Weakness EXAM: PORTABLE CHEST 1 VIEW COMPARISON:  02/27/2000 FINDINGS: The heart size and mediastinal contours are within normal limits. Unchanged elevation of the left hemidiaphragm. The visualized skeletal structures are unremarkable. IMPRESSION: No acute abnormality of the lungs in AP portable projection. Unchanged elevation of the left hemidiaphragm. Electronically Signed   By: Eddie Candle M.D.   On: 03/31/2019 17:56    Impression/Plan   68  y.o. 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 It was rec that she undergo L4-5 PLIF by [facility] while an inpatient now, but patient declines. She states she has things at home she needs to address prior to surgery. She will follow up with her PCP after discharge, Dr Bradd Burner of [facility], who can then contact us if patient wishes to pursue surgical intervention.  Ferne Reus, PA-C [facility] [employer]

## 2019-04-04 LAB — COPPER, SERUM: Copper: 114 ug/dL (ref 72–166)

## 2019-05-08 ENCOUNTER — Emergency Department (HOSPITAL_COMMUNITY)
Admission: EM | Admit: 2019-05-08 | Discharge: 2019-05-08 | Disposition: A | Discharge status: Home / Self Care | Payer: Medicare Other | Attending: Emergency Medicine | Admitting: Emergency Medicine

## 2019-05-08 ENCOUNTER — Other Ambulatory Visit: Payer: Self-pay

## 2019-05-08 ENCOUNTER — Emergency Department (HOSPITAL_COMMUNITY): Payer: Medicare Other

## 2019-05-08 ENCOUNTER — Encounter (HOSPITAL_COMMUNITY): Payer: Self-pay | Admitting: Emergency Medicine

## 2019-05-08 DIAGNOSIS — F17210 Nicotine dependence, cigarettes, uncomplicated: Secondary | ICD-10-CM | POA: Diagnosis not present

## 2019-05-08 DIAGNOSIS — Z7982 Long term (current) use of aspirin: Secondary | ICD-10-CM | POA: Insufficient documentation

## 2019-05-08 DIAGNOSIS — J449 Chronic obstructive pulmonary disease, unspecified: Secondary | ICD-10-CM | POA: Insufficient documentation

## 2019-05-08 DIAGNOSIS — Z79899 Other long term (current) drug therapy: Secondary | ICD-10-CM | POA: Insufficient documentation

## 2019-05-08 DIAGNOSIS — I252 Old myocardial infarction: Secondary | ICD-10-CM | POA: Diagnosis not present

## 2019-05-08 DIAGNOSIS — E1122 Type 2 diabetes mellitus with diabetic chronic kidney disease: Secondary | ICD-10-CM | POA: Diagnosis not present

## 2019-05-08 DIAGNOSIS — I5022 Chronic systolic (congestive) heart failure: Secondary | ICD-10-CM | POA: Diagnosis not present

## 2019-05-08 DIAGNOSIS — N186 End stage renal disease: Secondary | ICD-10-CM | POA: Diagnosis not present

## 2019-05-08 DIAGNOSIS — R0600 Dyspnea, unspecified: Secondary | ICD-10-CM | POA: Insufficient documentation

## 2019-05-08 DIAGNOSIS — I2510 Atherosclerotic heart disease of native coronary artery without angina pectoris: Secondary | ICD-10-CM | POA: Diagnosis not present

## 2019-05-08 DIAGNOSIS — Z7902 Long term (current) use of antithrombotics/antiplatelets: Secondary | ICD-10-CM | POA: Insufficient documentation

## 2019-05-08 DIAGNOSIS — Z992 Dependence on renal dialysis: Secondary | ICD-10-CM | POA: Diagnosis not present

## 2019-05-08 DIAGNOSIS — R0602 Shortness of breath: Secondary | ICD-10-CM | POA: Diagnosis present

## 2019-05-08 DIAGNOSIS — I132 Hypertensive heart and chronic kidney disease with heart failure and with stage 5 chronic kidney disease, or end stage renal disease: Secondary | ICD-10-CM | POA: Diagnosis not present

## 2019-05-08 LAB — BASIC METABOLIC PANEL
Anion gap: 17 — ABNORMAL HIGH (ref 5–15)
BUN: 76 mg/dL — ABNORMAL HIGH (ref 8–23)
CO2: 18 mmol/L — ABNORMAL LOW (ref 22–32)
Calcium: 9.5 mg/dL (ref 8.9–10.3)
Chloride: 105 mmol/L (ref 98–111)
Creatinine, Ser: 8.69 mg/dL — ABNORMAL HIGH (ref 0.44–1.00)
GFR calc Af Amer: 5 mL/min — ABNORMAL LOW (ref >60)
GFR calc non Af Amer: 4 mL/min — ABNORMAL LOW (ref >60)
Glucose, Bld: 103 mg/dL — ABNORMAL HIGH (ref 70–99)
Potassium: 4.1 mmol/L (ref 3.5–5.1)
Sodium: 140 mmol/L (ref 135–145)

## 2019-05-08 LAB — CBC WITH DIFFERENTIAL/PLATELET
Abs Immature Granulocytes: 0.03 10*3/uL (ref 0.00–0.07)
Basophils Absolute: 0 10*3/uL (ref 0.0–0.1)
Basophils Relative: 0 %
Eosinophils Absolute: 0.3 10*3/uL (ref 0.0–0.5)
Eosinophils Relative: 3 %
HCT: 34.9 % — ABNORMAL LOW (ref 36.0–46.0)
Hemoglobin: 10.8 g/dL — ABNORMAL LOW (ref 12.0–15.0)
Immature Granulocytes: 0 %
Lymphocytes Relative: 7 %
Lymphs Abs: 0.8 10*3/uL (ref 0.7–4.0)
MCH: 32 pg (ref 26.0–34.0)
MCHC: 30.9 g/dL (ref 30.0–36.0)
MCV: 103.6 fL — ABNORMAL HIGH (ref 80.0–100.0)
Monocytes Absolute: 0.8 10*3/uL (ref 0.1–1.0)
Monocytes Relative: 7 %
Neutro Abs: 8.9 10*3/uL — ABNORMAL HIGH (ref 1.7–7.7)
Neutrophils Relative %: 83 %
Platelets: 172 10*3/uL (ref 150–400)
RBC: 3.37 MIL/uL — ABNORMAL LOW (ref 3.87–5.11)
RDW: 15.4 % (ref 11.5–15.5)
WBC: 10.8 10*3/uL — ABNORMAL HIGH (ref 4.0–10.5)
nRBC: 0 % (ref 0.0–0.2)

## 2019-05-08 NOTE — ED Notes (Signed)
Dr. Francia Greaves advised patient may eat/drink. Pt provided snack and coffee per her request

## 2019-05-08 NOTE — ED Notes (Signed)
ED Provider at bedside. 

## 2019-05-08 NOTE — Discharge Instructions (Signed)
Please return for any problem.  Follow-up with your regular care providers as instructed.  Go directly from the ED to outpatient dialysis as instructed.

## 2019-05-08 NOTE — ED Notes (Signed)
Pt attempted to walk, but was unable due to left leg weakness, that she is been having from a month. Provider notified.

## 2019-05-08 NOTE — Progress Notes (Signed)
Renal Navigator received call from ED CSW/C. Shon Baton who is arranging transportation to OP HD clinic today as patient has been deemed suitable for discharge from ED. Her regularly scheduled OP HD seat is at 12:30pm at [facility] clinic today. CSW aware and will arrange transportation.  Alphonzo Cruise, [place] Renal Navigator [phone]

## 2019-05-08 NOTE — ED Provider Notes (Signed)
[facility] EMERGENCY DEPARTMENT Provider Note   CSN: [id number] Arrival date & time: 05/08/19  0844     History   Chief Complaint Chief Complaint  Patient presents with   Shortness of Breath    HPI Kari Robinson is a 68 y.o. female.     68 year old female with prior medical history as detailed below presents for evaluation of shortness of breath.  Patient reports that she has had slight increase in her dyspnea over the last 24 hours.  She does have a history of end-stage renal disease and is currently on dialysis.  Her last dialysis was Friday afternoon.  This dialysis session was cut short by approximately 30 minutes and secondary to some nausea associated with the treatment.  Over the last 24 hours she reports mildly increased shortness of breath.  She denies fever.  She denies sick contacts.  She denies new cough or chest pain.  She does report that she will intermittently use oxygen at home at 2 L nasal cannula.  She is speaking in full sentences.  She does not appear to be in any acute distress.  She reports that she is scheduled for her Monday dialysis session later today at approximately 12:30.  The history is provided by the patient and medical records.  Shortness of Breath Severity:  Mild Onset quality:  Gradual Duration:  1 day Timing:  Constant Progression:  Unchanged Chronicity:  New Context: activity   Relieved by:  Nothing Worsened by:  Nothing   Past Medical History:  Diagnosis Date   Acute on chronic heart failure ([name])    Acute respiratory failure with hypoxia ([name])    Alcohol abuse    Arthralgia    CAD (coronary artery disease)    moderate primarily septal perforater   CHF (congestive heart failure) ([name])    COPD (chronic obstructive pulmonary disease) ([name])    Depression    HTN (hypertension)    Hypercholesterolemia    primarily ldl-p and small particles   RAS (renal artery stenosis) ([place]) 2002   by cath tysinger     Renal disorder    Smoking     Patient Active Problem List   Diagnosis Date Noted   Fall 04/01/2019   Leg weakness, bilateral 04/01/2019   Tobacco abuse 04/01/2019   Alcohol abuse 04/01/2019   Transient loss of consciousness 02/24/2019   ESRD on dialysis ([place]) 02/24/2019   Seizure-like activity ([place]) 02/24/2019   NSTEMI (non-ST elevated myocardial infarction) ([place]) 02/24/2019   Ischemic cardiomyopathy 02/24/2019   Hypertensive emergency 09/30/2018   Type II diabetes mellitus with renal manifestations ([place]) 09/30/2018   HLD (hyperlipidemia) 09/30/2018   Depression with anxiety 09/30/2018   CKD (chronic kidney disease), stage V ([place]) 09/30/2018   Acute on chronic respiratory failure with hypoxia ([place]) 09/30/2018   Fluid overload 09/30/2018   CAD (coronary artery disease) 09/30/2018   Anemia in CKD (chronic kidney disease) 09/30/2018   COPD (chronic obstructive pulmonary disease) ([place])    Acute on chronic combined systolic and diastolic CHF (congestive heart failure) ([place])    Solitary right kidney 77/82/4235   Chronic systolic CHF (congestive heart failure) ([place]) 06/24/2018   Acute CHF (congestive heart failure) ([place]) 06/07/2018   Hypertensive emergency 06/07/2018   CKD (chronic kidney disease) stage 5, GFR less than 15 ml/min ([name]) 06/07/2018   Normocytic normochromic anemia 06/07/2018   DM (diabetes mellitus), type 2 with renal complications ([place]) 36/14/4315   Hypertensive urgency 06/07/2018   Occlusion and stenosis  of carotid artery without mention of cerebral infarction 01/19/2012   RENAL ARTERY STENOSIS 09/10/2010   ABDOMINAL BRUIT 08/12/2010   DEPRESSION 08/11/2010   PERIPHERAL VASCULAR DISEASE 08/11/2010   Essential hypertension 08/08/2010   Coronary atherosclerosis 08/08/2010   ARTHRALGIA 08/08/2010    Past Surgical History:  Procedure Laterality Date   abdominal aortogram     perclose of the right femoral artery   AV  FISTULA PLACEMENT Right 07/11/2018   Procedure: Right arm ARTERIOVENOUS FISTULA CREATION;  Surgeon: [name], MD;  Location: [facility] OR;  Service: Vascular;  Laterality: Right;   AVF placement Right    CARDIAC CATHETERIZATION     left heart catheterization.  Coronary cineangiography. Lft ventricular cineangiography.     LUNG SURGERY     due to lung cancer per patient's report   TUBAL LIGATION       OB History   No obstetric history on file.      Home Medications    Prior to Admission medications   Medication Sig Start Date End Date Taking? Authorizing Provider  albuterol (PROVENTIL HFA;VENTOLIN HFA) 108 (90 Base) MCG/ACT inhaler Inhale 2 puffs into the lungs every 4 (four) hours as needed for wheezing.  11/25/17   [provider]  albuterol (PROVENTIL) (2.5 MG/3ML) 0.083% nebulizer solution Take 2.5 mg by nebulization every 6 (six) hours as needed for wheezing or shortness of breath.  07/28/18   [provider]  aspirin EC 81 MG tablet Take 81 mg by mouth daily.    [provider]  atorvastatin (LIPITOR) 40 MG tablet Take 40 mg by mouth daily.    [provider]  b complex-vitamin c-folic acid (NEPHRO-VITE) 0.8 MG TABS tablet Take 1 tablet by mouth See admin instructions. Take one tablet by mouth on Sunday, Tuesday, Thursday, Saturday mornings. Take one tablet after dialysis on Monday, Wednesday, Friday 02/06/19   [provider]  budesonide-formoterol (SYMBICORT) 160-4.5 MCG/ACT inhaler Inhale 2 puffs into the lungs 2 (two) times daily.    [provider]  clopidogrel (PLAVIX) 75 MG tablet Take 75 mg by mouth daily.    [provider]  diclofenac sodium (VOLTAREN) 1 % GEL Apply 2 g topically 4 (four) times daily. 02/27/19   Hedges, Dellis Filbert, PA-C  hydrALAZINE (APRESOLINE) 50 MG tablet Take 1 tablet (50 mg total) by mouth 3 (three) times daily for 30 days. 04/02/19 05/02/19  Cherylann Ratel A, DO  isosorbide mononitrate (IMDUR)  30 MG 24 hr tablet Take 30 mg by mouth at bedtime.     [provider]  OXYGEN Inhale 2 L into the lungs continuous.    [provider]    Family History Family History  Problem Relation Age of Onset   Emphysema Father     Social History Social History   Tobacco Use   Smoking status: Current Every Day Smoker    Years: 35.00    Types: Cigarettes   Smokeless tobacco: Never Used   Tobacco comment: 3-4 cigarettes per day  Substance Use Topics   Alcohol use: Never    Frequency: Never    Comment: states has quit drinking "a few months ago"   Drug use: Never     Allergies   Citalopram hydrobromide and Morphine and related   Review of Systems Review of Systems  Respiratory: Positive for shortness of breath.   All other systems reviewed and are negative.    Physical Exam Updated Vital Signs BP (!) 172/115 (BP Location: Right Arm)  Pulse (!) 102    Temp 98.6 F (37 C) (Oral)    Resp (!) 27    SpO2 99%   Physical Exam Vitals signs and nursing note reviewed.  Constitutional:      General: She is not in acute distress.    Appearance: She is well-developed.  HENT:     Head: Normocephalic and atraumatic.  Eyes:     Conjunctiva/sclera: Conjunctivae normal.     Pupils: Pupils are equal, round, and reactive to light.  Neck:     Musculoskeletal: Normal range of motion and neck supple.  Cardiovascular:     Rate and Rhythm: Normal rate and regular rhythm.     Heart sounds: Normal heart sounds.  Pulmonary:     Effort: Pulmonary effort is normal. No respiratory distress.     Breath sounds: Normal breath sounds.  Abdominal:     General: There is no distension.     Palpations: Abdomen is soft.     Tenderness: There is no abdominal tenderness.  Musculoskeletal: Normal range of motion.        General: No deformity.  Skin:    General: Skin is warm and dry.  Neurological:     Mental Status: She is alert and oriented to person, place, and time.       ED Treatments / Results  Labs (all labs ordered are listed, but only abnormal results are displayed) Labs Reviewed  BASIC METABOLIC PANEL - Abnormal; Notable for the following components:      Result Value   CO2 18 (*)    Glucose, Bld 103 (*)    BUN 76 (*)    Creatinine, Ser 8.69 (*)    GFR calc non Af Amer 4 (*)    GFR calc Af Amer 5 (*)    Anion gap 17 (*)    All other components within normal limits  CBC WITH DIFFERENTIAL/PLATELET - Abnormal; Notable for the following components:   WBC 10.8 (*)    RBC 3.37 (*)    Hemoglobin 10.8 (*)    HCT 34.9 (*)    MCV 103.6 (*)    Neutro Abs 8.9 (*)    All other components within normal limits    EKG EKG Interpretation  Date/Time:  Monday May 08 2019 09:27:45 EDT Ventricular Rate:  100 PR Interval:    QRS Duration: 105 QT Interval:  403 QTC Calculation: 520 R Axis:   64 Text Interpretation:  Sinus tachycardia Multiform ventricular premature complexes RSR' in V1 or V2, probably normal variant LVH with secondary repolarization abnormality Prolonged QT interval Confirmed by Dene Gentry [phone]) on 05/08/2019 9:29:44 AM   Radiology Dg Chest Port 1 View  Result Date: 05/08/2019 CLINICAL DATA:  Dyspnea. EXAM: PORTABLE CHEST 1 VIEW COMPARISON:  Radiograph of Mar 31, 2019. FINDINGS: Stable cardiomediastinal silhouette. Atherosclerosis of thoracic aorta is noted. No pneumothorax or pleural effusion is noted. No acute pulmonary disease is noted. Bony thorax is unremarkable. IMPRESSION: No acute cardiopulmonary abnormality seen. Aortic Atherosclerosis (ICD10-I70.0). Electronically Signed   By: Marijo Conception M.D.   On: 05/08/2019 09:21    Procedures Procedures (including critical care time)  Medications Ordered in ED Medications - No data to display   Initial Impression / Assessment and Plan / ED Course  I have reviewed the triage vital signs and the nursing notes.  Pertinent labs & imaging results that were available  during my care of the patient were reviewed by me and considered in my medical decision  making (see chart for details).        MDM  Screen complete  Myha Arizpe was evaluated in Emergency Department on 05/08/2019 for the symptoms described in the history of present illness. She was evaluated in the context of the global COVID-19 pandemic, which necessitated consideration that the patient might be at risk for infection with the SARS-CoV-2 virus that causes COVID-19. Institutional protocols and algorithms that pertain to the evaluation of patients at risk for COVID-19 are in a state of rapid change based on information released by regulatory bodies including the CDC and federal and state organizations. These policies and algorithms were followed during the patient's care in the ED.   Patient is presenting for evaluation of reported dyspnea.  Patient is noted to be hypoxic.  Her chest x-ray is clear.  Her other labs do not show significant evidence of acute pathology.  I suspect that her symptoms are likely secondary to mild fluid overload.  Patient is scheduled for outpatient dialysis later today.  Case was discussed with on-call nephrology, Dr. Jonnie Finner, who agrees that the patient does not require inpatient dialysis.  Outpatient dialysis availability is confirmed.  Patient is appropriate for discharge directly from the ED to outpatient dialysis.  Patient is reporting to this examiner that she will have trouble obtaining transport.  Social work is consulted for assistance with transportation of the patient from the ED to dialysis.  Final Clinical Impressions(s) / ED Diagnoses   Final diagnoses:  Dyspnea, unspecified type    ED Discharge Orders    None       Valarie Merino, MD 05/08/19 [phone]

## 2019-05-08 NOTE — ED Triage Notes (Signed)
Pt arrives from home for c/o worsening sob since last night. Wears O2 prn, states that she had to put her O2 on r/t her sob. Goes to dialysis MWF, last session was Friday and she reports leaving about 30 mins early because she was having vomiting and diarrhea, states that she is now getting iron infusions at dialysis which have been upsetting her stomach. Denies any known sick contacts. States that diarrhea and vomiting have subsided, has chronic cough from hx of smoking. Pt a/ox4, resp e/u, nad. EMS reports patient's sats were 98-99% on room air

## 2019-05-08 NOTE — Progress Notes (Signed)
CSW received consult for patient to assist with transportation to her outpatient dialysis center. CSW spoke with Terri Piedra, LCSW who informed CSW that patient receives services at [facility] and that her appointment is at 12:30pm  CSW spoke with Lovena Le, RN who stated that the patient was not going to her appointment today due to her not having all of her items. RN discharged patient home via cab.  Madilyn Fireman, MSW, LCSW-A Clinical Social Worker [facility] [phone]

## 2019-05-09 ENCOUNTER — Emergency Department (HOSPITAL_COMMUNITY): Payer: Medicare Other

## 2019-05-09 ENCOUNTER — Encounter (HOSPITAL_COMMUNITY): Payer: Self-pay | Admitting: Emergency Medicine

## 2019-05-09 ENCOUNTER — Other Ambulatory Visit: Payer: Self-pay

## 2019-05-09 ENCOUNTER — Observation Stay (HOSPITAL_COMMUNITY)
Admission: EM | Admit: 2019-05-09 | Discharge: 2019-05-11 | Disposition: A | Discharge status: Home Health | Payer: Medicare Other | Attending: Internal Medicine | Admitting: Internal Medicine

## 2019-05-09 DIAGNOSIS — Z7951 Long term (current) use of inhaled steroids: Secondary | ICD-10-CM | POA: Insufficient documentation

## 2019-05-09 DIAGNOSIS — R000 Tachycardia, unspecified: Secondary | ICD-10-CM | POA: Diagnosis not present

## 2019-05-09 DIAGNOSIS — Z955 Presence of coronary angioplasty implant and graft: Secondary | ICD-10-CM | POA: Diagnosis not present

## 2019-05-09 DIAGNOSIS — I160 Hypertensive urgency: Secondary | ICD-10-CM | POA: Diagnosis present

## 2019-05-09 DIAGNOSIS — I2510 Atherosclerotic heart disease of native coronary artery without angina pectoris: Secondary | ICD-10-CM | POA: Diagnosis not present

## 2019-05-09 DIAGNOSIS — F17210 Nicotine dependence, cigarettes, uncomplicated: Secondary | ICD-10-CM | POA: Diagnosis not present

## 2019-05-09 DIAGNOSIS — E1122 Type 2 diabetes mellitus with diabetic chronic kidney disease: Secondary | ICD-10-CM | POA: Insufficient documentation

## 2019-05-09 DIAGNOSIS — I5043 Acute on chronic combined systolic (congestive) and diastolic (congestive) heart failure: Secondary | ICD-10-CM | POA: Diagnosis not present

## 2019-05-09 DIAGNOSIS — I132 Hypertensive heart and chronic kidney disease with heart failure and with stage 5 chronic kidney disease, or end stage renal disease: Secondary | ICD-10-CM | POA: Insufficient documentation

## 2019-05-09 DIAGNOSIS — I4891 Unspecified atrial fibrillation: Secondary | ICD-10-CM | POA: Insufficient documentation

## 2019-05-09 DIAGNOSIS — I700 Atherosclerosis of aorta: Secondary | ICD-10-CM | POA: Diagnosis not present

## 2019-05-09 DIAGNOSIS — N186 End stage renal disease: Secondary | ICD-10-CM | POA: Diagnosis not present

## 2019-05-09 DIAGNOSIS — I5021 Acute systolic (congestive) heart failure: Secondary | ICD-10-CM

## 2019-05-09 DIAGNOSIS — I6529 Occlusion and stenosis of unspecified carotid artery: Secondary | ICD-10-CM | POA: Insufficient documentation

## 2019-05-09 DIAGNOSIS — J9621 Acute and chronic respiratory failure with hypoxia: Secondary | ICD-10-CM | POA: Insufficient documentation

## 2019-05-09 DIAGNOSIS — Z720 Tobacco use: Secondary | ICD-10-CM | POA: Diagnosis present

## 2019-05-09 DIAGNOSIS — F329 Major depressive disorder, single episode, unspecified: Secondary | ICD-10-CM | POA: Insufficient documentation

## 2019-05-09 DIAGNOSIS — M48061 Spinal stenosis, lumbar region without neurogenic claudication: Secondary | ICD-10-CM | POA: Diagnosis present

## 2019-05-09 DIAGNOSIS — J441 Chronic obstructive pulmonary disease with (acute) exacerbation: Secondary | ICD-10-CM | POA: Diagnosis present

## 2019-05-09 DIAGNOSIS — Z7902 Long term (current) use of antithrombotics/antiplatelets: Secondary | ICD-10-CM | POA: Diagnosis not present

## 2019-05-09 DIAGNOSIS — Z1159 Encounter for screening for other viral diseases: Secondary | ICD-10-CM | POA: Insufficient documentation

## 2019-05-09 DIAGNOSIS — R0600 Dyspnea, unspecified: Secondary | ICD-10-CM | POA: Diagnosis present

## 2019-05-09 DIAGNOSIS — D72829 Elevated white blood cell count, unspecified: Secondary | ICD-10-CM | POA: Diagnosis present

## 2019-05-09 DIAGNOSIS — I10 Essential (primary) hypertension: Secondary | ICD-10-CM | POA: Diagnosis present

## 2019-05-09 DIAGNOSIS — I252 Old myocardial infarction: Secondary | ICD-10-CM | POA: Diagnosis not present

## 2019-05-09 DIAGNOSIS — E785 Hyperlipidemia, unspecified: Secondary | ICD-10-CM | POA: Diagnosis not present

## 2019-05-09 DIAGNOSIS — I255 Ischemic cardiomyopathy: Secondary | ICD-10-CM | POA: Insufficient documentation

## 2019-05-09 DIAGNOSIS — Z791 Long term (current) use of non-steroidal anti-inflammatories (NSAID): Secondary | ICD-10-CM | POA: Diagnosis not present

## 2019-05-09 DIAGNOSIS — D638 Anemia in other chronic diseases classified elsewhere: Secondary | ICD-10-CM | POA: Diagnosis present

## 2019-05-09 DIAGNOSIS — Z79899 Other long term (current) drug therapy: Secondary | ICD-10-CM | POA: Diagnosis not present

## 2019-05-09 DIAGNOSIS — Z992 Dependence on renal dialysis: Secondary | ICD-10-CM | POA: Insufficient documentation

## 2019-05-09 DIAGNOSIS — D631 Anemia in chronic kidney disease: Secondary | ICD-10-CM | POA: Diagnosis not present

## 2019-05-09 DIAGNOSIS — Z85118 Personal history of other malignant neoplasm of bronchus and lung: Secondary | ICD-10-CM | POA: Diagnosis not present

## 2019-05-09 DIAGNOSIS — Z902 Acquired absence of lung [part of]: Secondary | ICD-10-CM | POA: Insufficient documentation

## 2019-05-09 DIAGNOSIS — Z7982 Long term (current) use of aspirin: Secondary | ICD-10-CM | POA: Diagnosis not present

## 2019-05-09 DIAGNOSIS — E7800 Pure hypercholesterolemia, unspecified: Secondary | ICD-10-CM | POA: Insufficient documentation

## 2019-05-09 DIAGNOSIS — I509 Heart failure, unspecified: Secondary | ICD-10-CM

## 2019-05-09 DIAGNOSIS — M5430 Sciatica, unspecified side: Secondary | ICD-10-CM | POA: Insufficient documentation

## 2019-05-09 DIAGNOSIS — F1011 Alcohol abuse, in remission: Secondary | ICD-10-CM | POA: Insufficient documentation

## 2019-05-09 HISTORY — DX: Chronic combined systolic (congestive) and diastolic (congestive) heart failure: I50.42

## 2019-05-09 HISTORY — DX: End stage renal disease: N18.6

## 2019-05-09 HISTORY — DX: Malignant neoplasm of unspecified part of unspecified bronchus or lung: C34.90

## 2019-05-09 LAB — BASIC METABOLIC PANEL
Anion gap: 17 — ABNORMAL HIGH (ref 5–15)
BUN: 86 mg/dL — ABNORMAL HIGH (ref 8–23)
CO2: 15 mmol/L — ABNORMAL LOW (ref 22–32)
Calcium: 9.1 mg/dL (ref 8.9–10.3)
Chloride: 108 mmol/L (ref 98–111)
Creatinine, Ser: 8.99 mg/dL — ABNORMAL HIGH (ref 0.44–1.00)
GFR calc Af Amer: 5 mL/min — ABNORMAL LOW (ref >60)
GFR calc non Af Amer: 4 mL/min — ABNORMAL LOW (ref >60)
Glucose, Bld: 112 mg/dL — ABNORMAL HIGH (ref 70–99)
Potassium: 4.1 mmol/L (ref 3.5–5.1)
Sodium: 140 mmol/L (ref 135–145)

## 2019-05-09 LAB — CBC WITH DIFFERENTIAL/PLATELET
Abs Immature Granulocytes: 0.05 10*3/uL (ref 0.00–0.07)
Basophils Absolute: 0.1 10*3/uL (ref 0.0–0.1)
Basophils Relative: 1 %
Eosinophils Absolute: 0.4 10*3/uL (ref 0.0–0.5)
Eosinophils Relative: 3 %
HCT: 33 % — ABNORMAL LOW (ref 36.0–46.0)
Hemoglobin: 10.3 g/dL — ABNORMAL LOW (ref 12.0–15.0)
Immature Granulocytes: 1 %
Lymphocytes Relative: 8 %
Lymphs Abs: 0.8 10*3/uL (ref 0.7–4.0)
MCH: 32.3 pg (ref 26.0–34.0)
MCHC: 31.2 g/dL (ref 30.0–36.0)
MCV: 103.4 fL — ABNORMAL HIGH (ref 80.0–100.0)
Monocytes Absolute: 0.8 10*3/uL (ref 0.1–1.0)
Monocytes Relative: 7 %
Neutro Abs: 8.7 10*3/uL — ABNORMAL HIGH (ref 1.7–7.7)
Neutrophils Relative %: 80 %
Platelets: 172 10*3/uL (ref 150–400)
RBC: 3.19 MIL/uL — ABNORMAL LOW (ref 3.87–5.11)
RDW: 15.6 % — ABNORMAL HIGH (ref 11.5–15.5)
WBC: 10.8 10*3/uL — ABNORMAL HIGH (ref 4.0–10.5)
nRBC: 0 % (ref 0.0–0.2)

## 2019-05-09 LAB — TROPONIN I
Troponin I: 0.17 ng/mL (ref <0.03)
Troponin I: 0.18 ng/mL (ref <0.03)
Troponin I: 0.14 ng/mL (ref <0.03)
Troponin I: 0.14 ng/mL (ref <0.03)

## 2019-05-09 LAB — SARS CORONAVIRUS 2 BY RT PCR (HOSPITAL ORDER, PERFORMED IN ~~LOC~~ HOSPITAL LAB): SARS Coronavirus 2: NEGATIVE

## 2019-05-09 LAB — BRAIN NATRIURETIC PEPTIDE: B Natriuretic Peptide: 3871.3 pg/mL — ABNORMAL HIGH (ref 0.0–100.0)

## 2019-05-09 LAB — GLUCOSE, CAPILLARY: Glucose-Capillary: 123 mg/dL — ABNORMAL HIGH (ref 70–99)

## 2019-05-09 MED ORDER — PREDNISONE 20 MG PO TABS
40.0000 mg | ORAL_TABLET | Freq: Every day | ORAL | Status: DC
  Administered 2019-05-10 – 2019-05-11 (×2): 40 mg via ORAL
  Filled 2019-05-09 (×3): qty 2

## 2019-05-09 MED ORDER — CHLORHEXIDINE GLUCONATE CLOTH 2 % EX PADS
6.0000 | MEDICATED_PAD | Freq: Every day | CUTANEOUS | Status: DC
  Administered 2019-05-10: 6 via TOPICAL

## 2019-05-09 MED ORDER — LIDOCAINE-PRILOCAINE 2.5-2.5 % EX CREA
1.0000 "application " | TOPICAL_CREAM | CUTANEOUS | Status: DC
  Filled 2019-05-09: qty 5

## 2019-05-09 MED ORDER — ONDANSETRON HCL 4 MG/2ML IJ SOLN: 4.0000 mg | Freq: Four times a day (QID) | INTRAMUSCULAR | Status: DC | PRN

## 2019-05-09 MED ORDER — ASPIRIN EC 81 MG PO TBEC
81.0000 mg | DELAYED_RELEASE_TABLET | Freq: Every day | ORAL | Status: DC
  Administered 2019-05-09 – 2019-05-11 (×3): 81 mg via ORAL
  Filled 2019-05-09 (×3): qty 1

## 2019-05-09 MED ORDER — GUAIFENESIN ER 600 MG PO TB12
600.0000 mg | ORAL_TABLET | Freq: Two times a day (BID) | ORAL | Status: DC
  Administered 2019-05-09 – 2019-05-11 (×5): 600 mg via ORAL
  Filled 2019-05-09 (×5): qty 1

## 2019-05-09 MED ORDER — ONDANSETRON HCL 4 MG PO TABS: 4.0000 mg | ORAL_TABLET | Freq: Four times a day (QID) | ORAL | Status: DC | PRN

## 2019-05-09 MED ORDER — CINACALCET HCL 30 MG PO TABS
30.0000 mg | ORAL_TABLET | ORAL | Status: DC
  Administered 2019-05-10: 30 mg via ORAL
  Filled 2019-05-09 (×2): qty 1

## 2019-05-09 MED ORDER — ALBUTEROL SULFATE (2.5 MG/3ML) 0.083% IN NEBU
2.5000 mg | INHALATION_SOLUTION | Freq: Four times a day (QID) | RESPIRATORY_TRACT | Status: DC
  Administered 2019-05-09 – 2019-05-10 (×5): 2.5 mg via RESPIRATORY_TRACT
  Filled 2019-05-09 (×5): qty 3

## 2019-05-09 MED ORDER — HEPARIN SODIUM (PORCINE) 5000 UNIT/ML IJ SOLN
5000.0000 [IU] | Freq: Three times a day (TID) | INTRAMUSCULAR | Status: DC
  Administered 2019-05-09 – 2019-05-10 (×4): 5000 [IU] via SUBCUTANEOUS
  Filled 2019-05-09 (×4): qty 1

## 2019-05-09 MED ORDER — BUDESONIDE 0.5 MG/2ML IN SUSP
0.5000 mg | Freq: Two times a day (BID) | RESPIRATORY_TRACT | Status: DC
  Administered 2019-05-09 – 2019-05-11 (×5): 0.5 mg via RESPIRATORY_TRACT
  Filled 2019-05-09 (×8): qty 2

## 2019-05-09 MED ORDER — FUROSEMIDE 10 MG/ML IJ SOLN
40.0000 mg | Freq: Once | INTRAMUSCULAR | Status: AC
  Administered 2019-05-09: 40 mg via INTRAVENOUS
  Filled 2019-05-09: qty 4

## 2019-05-09 MED ORDER — ARFORMOTEROL TARTRATE 15 MCG/2ML IN NEBU
15.0000 ug | INHALATION_SOLUTION | Freq: Two times a day (BID) | RESPIRATORY_TRACT | Status: DC
  Administered 2019-05-09 – 2019-05-11 (×4): 15 ug via RESPIRATORY_TRACT
  Filled 2019-05-09 (×8): qty 2

## 2019-05-09 MED ORDER — ACETAMINOPHEN 325 MG PO TABS: 650.0000 mg | ORAL_TABLET | Freq: Four times a day (QID) | ORAL | Status: DC | PRN

## 2019-05-09 MED ORDER — ISOSORBIDE MONONITRATE ER 30 MG PO TB24
30.0000 mg | ORAL_TABLET | Freq: Every day | ORAL | Status: DC
  Administered 2019-05-09 – 2019-05-11 (×3): 30 mg via ORAL
  Filled 2019-05-09 (×3): qty 1

## 2019-05-09 MED ORDER — SODIUM CHLORIDE 0.9% FLUSH
3.0000 mL | Freq: Two times a day (BID) | INTRAVENOUS | Status: DC
  Administered 2019-05-09 – 2019-05-11 (×3): 3 mL via INTRAVENOUS

## 2019-05-09 MED ORDER — HYDRALAZINE HCL 20 MG/ML IJ SOLN: 10.0000 mg | INTRAMUSCULAR | Status: DC | PRN

## 2019-05-09 MED ORDER — NITROGLYCERIN 2 % TD OINT
1.0000 [in_us] | TOPICAL_OINTMENT | Freq: Once | TRANSDERMAL | Status: AC
  Administered 2019-05-09: 1 [in_us] via TOPICAL
  Filled 2019-05-09: qty 1

## 2019-05-09 MED ORDER — QUETIAPINE FUMARATE 25 MG PO TABS
200.0000 mg | ORAL_TABLET | Freq: Every day | ORAL | Status: DC
  Administered 2019-05-09 – 2019-05-11 (×3): 200 mg via ORAL
  Filled 2019-05-09 (×3): qty 8
  Filled 2019-05-09: qty 1

## 2019-05-09 MED ORDER — ATORVASTATIN CALCIUM 40 MG PO TABS
40.0000 mg | ORAL_TABLET | Freq: Every day | ORAL | Status: DC
  Administered 2019-05-09 – 2019-05-11 (×3): 40 mg via ORAL
  Filled 2019-05-09 (×3): qty 1

## 2019-05-09 MED ORDER — ALBUTEROL SULFATE HFA 108 (90 BASE) MCG/ACT IN AERS
10.0000 | INHALATION_SPRAY | Freq: Once | RESPIRATORY_TRACT | Status: AC
  Administered 2019-05-09: 10 via RESPIRATORY_TRACT
  Filled 2019-05-09: qty 6.7

## 2019-05-09 MED ORDER — FERRIC CITRATE 1 GM 210 MG(FE) PO TABS
210.0000 mg | ORAL_TABLET | Freq: Three times a day (TID) | ORAL | Status: DC
  Administered 2019-05-09 – 2019-05-11 (×6): 210 mg via ORAL
  Filled 2019-05-09 (×11): qty 1

## 2019-05-09 MED ORDER — SODIUM CHLORIDE 0.9 % IV SOLN
1.0000 g | INTRAVENOUS | Status: DC
  Administered 2019-05-09 – 2019-05-11 (×3): 1 g via INTRAVENOUS
  Filled 2019-05-09: qty 1
  Filled 2019-05-09 (×3): qty 10

## 2019-05-09 MED ORDER — CLOPIDOGREL BISULFATE 75 MG PO TABS
75.0000 mg | ORAL_TABLET | Freq: Every day | ORAL | Status: DC
  Administered 2019-05-09 – 2019-05-11 (×3): 75 mg via ORAL
  Filled 2019-05-09 (×3): qty 1

## 2019-05-09 MED ORDER — CALCITRIOL 0.5 MCG PO CAPS
1.2500 ug | ORAL_CAPSULE | ORAL | Status: DC
  Filled 2019-05-09: qty 1

## 2019-05-09 MED ORDER — ACETAMINOPHEN 650 MG RE SUPP: 650.0000 mg | Freq: Four times a day (QID) | RECTAL | Status: DC | PRN

## 2019-05-09 MED ORDER — METHYLPREDNISOLONE SODIUM SUCC 125 MG IJ SOLR
125.0000 mg | Freq: Once | INTRAMUSCULAR | Status: AC
  Administered 2019-05-09: 125 mg via INTRAVENOUS
  Filled 2019-05-09: qty 2

## 2019-05-09 NOTE — Progress Notes (Signed)
Allayne Gitelman, RN called at [phone] and made aware the patient's HD tx has been moved to 05/10/23 by Nephrologist. correction to the previous note at 1818.

## 2019-05-09 NOTE — Evaluation (Signed)
Occupational Therapy Evaluation Patient Details Name: Kari Robinson MRN: [id number] DOB: 02/05/1951 Today's Date: 05/09/2019    History of Present Illness Pt is a 68 yo female s/p Biapical emphysema, tachypneic, tachycardia and acute respiratory failure with elevaetd troponin. PMHx: COPD, HTN, HLD, depression, ESRD on MWF.   Clinical Impression   Pt PTA: living at home with significant other and requiring increased assist for ADL, increasing pain in low back and N/V reports from iron supplements at dialysis. Pt currently, performing ADL functional mobility with minA for stand pivot, taking only a few steps. BLEs very weak and unable to tolerate ambulating more than a few steps. Pt's O2 stayed >90% on 3L with exertion and >90% on 2L O2 at rest. Pt set-upA for UB ADL and maxA for LB ADL due to poor activity tolerance, decreased strength and increased need for caregivers. Pt would benefit from continued OT skilled services for ADL, mobility and safety in CIR setting.   Pt verbally agreed to SNF vs CIR at this time as she knows that she is unsafe due to recent falls and unsafe at home. OT to follow acutely.       Follow Up Recommendations  CIR;Supervision/Assistance - 24 hour    Equipment Recommendations  3 in 1 bedside commode    Recommendations for Other Services Rehab consult     Precautions / Restrictions Precautions Precautions: Fall;Other (comment) Precaution Comments: watch O2 on 2L  Restrictions Weight Bearing Restrictions: No      Mobility Bed Mobility Overal bed mobility: Needs Assistance Bed Mobility: Sidelying to Sit;Sit to Supine   Sidelying to sit: Min guard   Sit to supine: Min guard   General bed mobility comments: minguardA and use of rail  Transfers Overall transfer level: Needs assistance Equipment used: Rolling walker (2 wheeled) Transfers: Sit to/from [facility] Sit to Stand: Min assist Stand pivot transfers: Min assist        General transfer comment: assist for stability; pt's BLEs shaking and unable to tolerate more than a few steps with RW.    Balance Overall balance assessment: Needs assistance Sitting-balance support: Bilateral upper extremity supported Sitting balance-Leahy Scale: Fair     Standing balance support: Bilateral upper extremity supported Standing balance-Leahy Scale: Fair                             ADL either performed or assessed with clinical judgement   ADL Overall ADL's : Needs assistance/impaired Eating/Feeding: Modified independent;Sitting   Grooming: Set up;Sitting   Upper Body Bathing: Minimal assistance;Sitting   Lower Body Bathing: Maximal assistance;Sitting/lateral leans;Sit to/from stand;Cueing for safety   Upper Body Dressing : Minimal assistance;Sitting;Standing   Lower Body Dressing: Maximal assistance;Sitting/lateral leans;Sit to/from stand;Cueing for safety   Toilet Transfer: Min guard;BSC;Stand-pivot;Cueing for safety   Toileting- Clothing Manipulation and Hygiene: Minimal assistance;Cueing for safety;Cueing for sequencing;Sitting/lateral lean;Sit to/from stand       Functional mobility during ADLs: Min guard;Rolling walker(stand pivot) General ADL Comments: Pt set-upA for UB ADL in sitting; pt able to perform toilet hygiene in standing for 10 secs prior to sitting down with maxA for OTR to complete task.  Pt unable to properly care for self.     Vision Baseline Vision/History: Wears glasses Wears Glasses: At all times Vision Assessment?: No apparent visual deficits     Perception     Praxis      Pertinent Vitals/Pain Pain Assessment: Faces Faces Pain Scale: Hurts  little more Pain Location: back     Hand Dominance Right   Extremity/Trunk Assessment Upper Extremity Assessment Upper Extremity Assessment: Overall WFL for tasks assessed   Lower Extremity Assessment Lower Extremity Assessment: Generalized weakness;RLE  deficits/detail;LLE deficits/detail RLE Deficits / Details: shaking from weakness LLE Deficits / Details: shaking from weakness   Cervical / Trunk Assessment Cervical / Trunk Assessment: Normal   Communication Communication Communication: No difficulties   Cognition Arousal/Alertness: Awake/alert Behavior During Therapy: WFL for tasks assessed/performed Overall Cognitive Status: Within Functional Limits for tasks assessed                                 General Comments: could benefit from further testing   General Comments  Pt's O2 >98% on 3L O2 with exertion; 2L O2 at rest >98%     Exercises     Shoulder Instructions      Home Living Family/patient expects to be discharged to:: Private residence Living Arrangements: Spouse/significant other Available Help at Discharge: Family;Available 24 hours/day Type of Home: Apartment Home Access: Level entry     Home Layout: One level     Bathroom Shower/Tub: Teacher, early years/pre: Standard     Home Equipment: Clinical cytogeneticist - 4 wheels;Grab bars - tub/shower;Toilet riser          Prior Functioning/Environment Level of Independence: Needs assistance  Gait / Transfers Assistance Needed: with rollator, multiple falls in the past month              OT Problem List: Decreased strength;Decreased activity tolerance;Impaired balance (sitting and/or standing);Decreased safety awareness;Pain;Decreased cognition;Decreased knowledge of use of DME or AE      OT Treatment/Interventions: Self-care/ADL training;Therapeutic exercise;Neuromuscular education;Energy conservation;DME and/or AE instruction;Therapeutic activities;Balance training;Patient/family education    OT Goals(Current goals can be found in the care plan section) Acute Rehab OT Goals Patient Stated Goal: to get my legs stronger OT Goal Formulation: With patient Time For Goal Achievement: 05/23/19 Potential to Achieve Goals: Good ADL  Goals Pt Will Perform Grooming: with modified independence;standing Pt Will Perform Upper Body Dressing: with modified independence;standing Pt Will Perform Lower Body Dressing: with supervision;sit to/from stand Pt Will Transfer to Toilet: with supervision;ambulating;regular height toilet;grab bars Pt Will Perform Toileting - Clothing Manipulation and hygiene: with set-up;sitting/lateral leans;sit to/from stand Additional ADL Goal #1: Pt will stand x5 mins for OOB ADL with fair balance and set-upA.  OT Frequency: Min 2X/week   Barriers to D/C:            Co-evaluation              AM-PAC OT "6 Clicks" Daily Activity     Outcome Measure Help from another person eating meals?: None Help from another person taking care of personal grooming?: A Little Help from another person toileting, which includes using toliet, bedpan, or urinal?: A Lot Help from another person bathing (including washing, rinsing, drying)?: A Lot Help from another person to put on and taking off regular upper body clothing?: A Little Help from another person to put on and taking off regular lower body clothing?: A Lot 6 Click Score: 16   End of Session Equipment Utilized During Treatment: Gait belt;Rolling walker Nurse Communication: Mobility status  Activity Tolerance: Patient tolerated treatment well Patient left: in bed;with call bell/phone within reach;with bed alarm set  OT Visit Diagnosis: Unsteadiness on feet (R26.81);Muscle weakness (generalized) (M62.81);Pain Pain - part of body:  Leg                Time: 1440-1515 OT Time Calculation (min): 35 min Charges:  OT General Charges $OT Visit: 1 Visit OT Evaluation $OT Eval Moderate Complexity: 1 Mod OT Treatments $Self Care/Home Management : 8-22 mins  Ebony Hail Harold Hedge) Marsa Aris OTR/L Acute Rehabilitation Services Pager: [phone] Office: [facility] 05/09/2019, 3:38 PM

## 2019-05-09 NOTE — ED Notes (Signed)
RN attempted to give Report x1.

## 2019-05-09 NOTE — ED Provider Notes (Signed)
[facility] EMERGENCY DEPARTMENT Provider Note   CSN: [id number] Arrival date & time: 05/09/19  0451     History   Chief Complaint Chief Complaint  Patient presents with  . Shortness of Breath    HPI Kari Robinson is a 68 y.o. female.     The history is provided by the patient.  Shortness of Breath Severity:  Severe Onset quality:  Gradual Timing:  Constant Progression:  Worsening Chronicity:  Recurrent Context: not activity, not fumes, not pollens and not URI   Relieved by:  Nothing Worsened by:  Nothing Ineffective treatments:  None tried Associated symptoms: wheezing   Associated symptoms: no chest pain, no cough, no diaphoresis and no fever   Wheezing:    Severity:  Moderate   Onset quality:  Gradual   Timing:  Constant   Progression:  Worsening   Chronicity:  Recurrent Risk factors: no prolonged immobilization   Patient with COPD, CHF, ESRD on dialysis who missed yesterday due to being in the ED with increased O2 requirement presents with worsening SOB and wheezing.  No f/c/r.  No leg swelling no covid exposure.    Past Medical History:  Diagnosis Date  . Acute on chronic heart failure ([place])   . Acute respiratory failure with hypoxia ([place])   . Alcohol abuse   . Arthralgia   . CAD (coronary artery disease)    moderate primarily septal perforater  . CHF (congestive heart failure) ([place])   . COPD (chronic obstructive pulmonary disease) ([place])   . Depression   . HTN (hypertension)   . Hypercholesterolemia    primarily ldl-p and small particles  . RAS (renal artery stenosis) ([place]) 2002   by cath tysinger  . Renal disorder   . Smoking     Patient Active Problem List   Diagnosis Date Noted  . Fall 04/01/2019  . Leg weakness, bilateral 04/01/2019  . Tobacco abuse 04/01/2019  . Alcohol abuse 04/01/2019  . Transient loss of consciousness 02/24/2019  . ESRD on dialysis ([place]) 02/24/2019  . Seizure-like activity ([place]) 02/24/2019  .  NSTEMI (non-ST elevated myocardial infarction) ([place]) 02/24/2019  . Ischemic cardiomyopathy 02/24/2019  . Hypertensive emergency 09/30/2018  . Type II diabetes mellitus with renal manifestations ([place]) 09/30/2018  . HLD (hyperlipidemia) 09/30/2018  . Depression with anxiety 09/30/2018  . CKD (chronic kidney disease), stage V ([place]) 09/30/2018  . Acute on chronic respiratory failure with hypoxia ([place]) 09/30/2018  . Fluid overload 09/30/2018  . CAD (coronary artery disease) 09/30/2018  . Anemia in CKD (chronic kidney disease) 09/30/2018  . COPD (chronic obstructive pulmonary disease) ([place])   . Acute on chronic combined systolic and diastolic CHF (congestive heart failure) ([place])   . Solitary right kidney 06/24/2018  . Chronic systolic CHF (congestive heart failure) ([place]) 06/24/2018  . Acute CHF (congestive heart failure) ([place]) 06/07/2018  . Hypertensive emergency 06/07/2018  . CKD (chronic kidney disease) stage 5, GFR less than 15 ml/min ([name]) 06/07/2018  . Normocytic normochromic anemia 06/07/2018  . DM (diabetes mellitus), type 2 with renal complications ([place]) 94/76/5465  . Hypertensive urgency 06/07/2018  . Occlusion and stenosis of carotid artery without mention of cerebral infarction 01/19/2012  . RENAL ARTERY STENOSIS 09/10/2010  . ABDOMINAL BRUIT 08/12/2010  . DEPRESSION 08/11/2010  . PERIPHERAL VASCULAR DISEASE 08/11/2010  . Essential hypertension 08/08/2010  . Coronary atherosclerosis 08/08/2010  . ARTHRALGIA 08/08/2010    Past Surgical History:  Procedure Laterality Date  . abdominal aortogram  perclose of the right femoral artery  . AV FISTULA PLACEMENT Right 07/11/2018   Procedure: Right arm ARTERIOVENOUS FISTULA CREATION;  Surgeon: [name], MD;  Location: [facility];  Service: Vascular;  Laterality: Right;  . AVF placement Right   . CARDIAC CATHETERIZATION     left heart catheterization.  Coronary cineangiography. Lft ventricular cineangiography.    [name] LUNG  SURGERY     due to lung cancer per patient's report  . TUBAL LIGATION       OB History   No obstetric history on file.      Home Medications    Prior to Admission medications   Medication Sig Start Date End Date Taking? Authorizing Provider  albuterol (PROVENTIL HFA;VENTOLIN HFA) 108 (90 Base) MCG/ACT inhaler Inhale 2 puffs into the lungs every 4 (four) hours as needed for wheezing.  11/25/17   [provider]  albuterol (PROVENTIL) (2.5 MG/3ML) 0.083% nebulizer solution Take 2.5 mg by nebulization every 6 (six) hours as needed for wheezing or shortness of breath.  07/28/18   [provider]  aspirin EC 81 MG tablet Take 81 mg by mouth daily.    [provider]  atorvastatin (LIPITOR) 40 MG tablet Take 40 mg by mouth daily.    [provider]  b complex-vitamin c-folic acid (NEPHRO-VITE) 0.8 MG TABS tablet Take 1 tablet by mouth See admin instructions. Take one tablet by mouth on Sunday, Tuesday, Thursday, Saturday mornings. Take one tablet after dialysis on Monday, Wednesday, Friday 02/06/19   [provider]  budesonide-formoterol (SYMBICORT) 160-4.5 MCG/ACT inhaler Inhale 2 puffs into the lungs 2 (two) times daily.    [provider]  clopidogrel (PLAVIX) 75 MG tablet Take 75 mg by mouth daily.    [provider]  diclofenac sodium (VOLTAREN) 1 % GEL Apply 2 g topically 4 (four) times daily. 02/27/19   Hedges, Dellis Filbert, PA-C  hydrALAZINE (APRESOLINE) 50 MG tablet Take 1 tablet (50 mg total) by mouth 3 (three) times daily for 30 days. 04/02/19 05/02/19  Cherylann Ratel A, DO  isosorbide mononitrate (IMDUR) 30 MG 24 hr tablet Take 30 mg by mouth at bedtime.     [provider]  OXYGEN Inhale 2 L into the lungs continuous.    [provider]    Family History Family History  Problem Relation Age of Onset  . Emphysema Father     Social History Social History   Tobacco Use  . Smoking status: Current Every Day  Smoker    Years: 35.00    Types: Cigarettes  . Smokeless tobacco: Never Used  . Tobacco comment: 3-4 cigarettes per day  Substance Use Topics  . Alcohol use: Never    Frequency: Never    Comment: states has quit drinking "a few months ago"  . Drug use: Never     Allergies   Citalopram hydrobromide and Morphine and related   Review of Systems Review of Systems  Constitutional: Negative for diaphoresis and fever.  Respiratory: Positive for shortness of breath and wheezing. Negative for cough.   Cardiovascular: Negative for chest pain.  All other systems reviewed and are negative.    Physical Exam Updated Vital Signs BP (!) 171/125   Pulse (!) 111   Temp 98 F (36.7 C)   Resp (!) 26   SpO2 98%   Physical Exam   ED Treatments / Results  Labs (all labs ordered are listed, but only abnormal results are displayed) Labs Reviewed  CBC WITH  DIFFERENTIAL/PLATELET - Abnormal; Notable for the following components:      Result Value   WBC 10.8 (*)    RBC 3.19 (*)    Hemoglobin 10.3 (*)    HCT 33.0 (*)    MCV 103.4 (*)    RDW 15.6 (*)    Neutro Abs 8.7 (*)    All other components within normal limits  BRAIN NATRIURETIC PEPTIDE - Abnormal; Notable for the following components:   B Natriuretic Peptide 3,871.3 (*)    All other components within normal limits  SARS CORONAVIRUS 2 (HOSPITAL ORDER, [facility])  BASIC METABOLIC PANEL  TROPONIN I    EKG    Radiology Dg Chest Portable 1 View  Result Date: 05/09/2019 CLINICAL DATA:  Worsening shortness of breath EXAM: PORTABLE CHEST 1 VIEW COMPARISON:  05/08/2019 FINDINGS: Normal heart size. Aortic tortuosity. There has been coronary stenting. Postoperative left lung with volume loss. Biapical emphysema. There is no edema, consolidation, effusion, or pneumothorax. IMPRESSION: Postoperative chest with emphysema. No acute finding when compared to prior. Electronically Signed   By: Monte Fantasia M.D.    On: 05/09/2019 05:25   Dg Chest Port 1 View  Result Date: 05/08/2019 CLINICAL DATA:  Dyspnea. EXAM: PORTABLE CHEST 1 VIEW COMPARISON:  Radiograph of Mar 31, 2019. FINDINGS: Stable cardiomediastinal silhouette. Atherosclerosis of thoracic aorta is noted. No pneumothorax or pleural effusion is noted. No acute pulmonary disease is noted. Bony thorax is unremarkable. IMPRESSION: No acute cardiopulmonary abnormality seen. Aortic Atherosclerosis (ICD10-I70.0). Electronically Signed   By: Marijo Conception M.D.   On: 05/08/2019 09:21    Procedures Procedures (including critical care time)  Medications Ordered in ED Medications  furosemide (LASIX) injection 40 mg (has no administration in time range)  nitroGLYCERIN (NITROGLYN) 2 % ointment 1 inch (has no administration in time range)  methylPREDNISolone sodium succinate (SOLU-MEDROL) 125 mg/2 mL injection 125 mg (125 mg Intravenous Given 05/09/19 0621)  albuterol (VENTOLIN HFA) 108 (90 Base) MCG/ACT inhaler 10 puff (10 puffs Inhalation Given 05/09/19 0616)     Final Clinical Impressions(s) / ED Diagnoses   Admit to medicine    Valine Drozdowski, MD 05/09/19 0388

## 2019-05-09 NOTE — ED Notes (Signed)
RN paged MD d/t elevated Trop

## 2019-05-09 NOTE — ED Notes (Signed)
Troponin 0.18 reported from lab

## 2019-05-09 NOTE — Progress Notes (Signed)
Renal Navigator notified OP HD clinic/[facility] of patient's admission and negative COVID 19 rapid test result to provide continuity of care and safety.  Kari Vari Elizabeth, LCSW Renal Navigator [phone] 

## 2019-05-09 NOTE — ED Notes (Signed)
Pt ambulated to the RR on 2L of O2  started feeling dizzy once she sat down you use RR highest HR 120s had to wheel pt back to room because she felt unsteedy o2 100%

## 2019-05-09 NOTE — Progress Notes (Signed)
Rehab Admissions Coordinator Note:  Per OT recommendation, this patient was screened by Jhonnie Garner for appropriateness for an Inpatient Acute Rehab Consult. Noted pt is observation status at this time. Pt may not have the medical necessity to warrant an inpatient rehab stay if they remain observation. If status were to change to inpatient, [facility] will screen for candidacy.   Jhonnie Garner 05/09/2019, 3:55 PM  I can be reached at [phone].

## 2019-05-09 NOTE — Progress Notes (Signed)
Serita Grammes, RN called and made aware the patient's HD tx has been moved to 05/10/23 by Nephrologist.

## 2019-05-09 NOTE — ED Notes (Signed)
ED TO INPATIENT HANDOFF REPORT  ED Nurse Name and Phone #: Brie [phone]  S Name/Age/Gender Kari Robinson 68 y.o. female Room/Bed: 056C/056C  Code Status   Code Status: Full Code  Home/SNF/Other Home Patient oriented to: self, place, time and situation Is this baseline? Yes   Triage Complete: Triage complete  Chief Complaint SOB  Triage Note Pt arrives from home via [facility] for c/o worsening sob, pt was seen in ED yesterday and discharged home, missed dialysis yesterday. Wears O2 PRN, pt reports wearing 3L O2 r/t feeling so sob. Pt a/ox4, resp e/u, nad.    Allergies Allergies  Allergen Reactions  . Citalopram Hydrobromide Hives  . Morphine And Related Itching    Level of Care/Admitting Diagnosis ED Disposition    ED Disposition Condition [facility]: [place] [[id number]]  Level of Care: Telemetry Medical [104]  I expect the patient will be discharged within 24 hours: No (not a candidate for 5C-Observation unit)  Covid Evaluation: Screening Protocol (No Symptoms)  Diagnosis: COPD exacerbation [facility]) [209470]  Admitting Physician: Norval Morton [[id number]]  Attending Physician: Norval Morton [[id number]]  PT Class (Do Not Modify): Observation [104]  PT Acc Code (Do Not Modify): Observation [10022]       B Medical/Surgery History Past Medical History:  Diagnosis Date  . Acute on chronic heart failure ([place])   . Acute respiratory failure with hypoxia ([place])   . Alcohol abuse   . Arthralgia   . CAD (coronary artery disease)    moderate primarily septal perforater  . CHF (congestive heart failure) ([place])   . COPD (chronic obstructive pulmonary disease) ([place])   . Depression   . HTN (hypertension)   . Hypercholesterolemia    primarily ldl-p and small particles  . RAS (renal artery stenosis) ([place]) 2002   by cath tysinger  . Renal disorder   . Smoking    Past Surgical History:  Procedure Laterality Date  . abdominal aortogram      perclose of the right femoral artery  . AV FISTULA PLACEMENT Right 07/11/2018   Procedure: Right arm ARTERIOVENOUS FISTULA CREATION;  Surgeon: [name], MD;  Location: [facility];  Service: Vascular;  Laterality: Right;  . AVF placement Right   . CARDIAC CATHETERIZATION     left heart catheterization.  Coronary cineangiography. Lft ventricular cineangiography.    [name] LUNG SURGERY     due to lung cancer per patient's report  . TUBAL LIGATION       A IV Location/Drains/Wounds Patient Lines/Drains/Airways Status   Active Line/Drains/Airways    Name:   Placement date:   Placement time:   Site:   Days:   Peripheral IV 05/09/19 Left Wrist   05/09/19    0545    Wrist   less than 1   Fistula / Graft Right Forearm Arteriovenous fistula   07/11/18    0839    Forearm   302   Fistula / Graft Right Forearm Arteriovenous fistula   02/24/19    -    Forearm   74   External Urinary Catheter   05/09/19    0625    -   less than 1   Incision (Closed) 07/11/18 Arm Right   07/11/18    0839     302          Intake/Output Last 24 hours  Intake/Output Summary (Last 24 hours) at 05/09/2019 1342 Last data filed at 05/09/2019 1208  Gross per 24 hour  Intake 100 ml  Output -  Net 100 ml    Labs/Imaging Results for orders placed or performed during the hospital encounter of 05/09/19 (from the past 48 hour(s))  CBC with Differential/Platelet     Status: Abnormal   Collection Time: 05/09/19  4:55 AM  Result Value Ref Range   WBC 10.8 (H) 4.0 - 10.5 K/uL   RBC 3.19 (L) 3.87 - 5.11 MIL/uL   Hemoglobin 10.3 (L) 12.0 - 15.0 g/dL   HCT 33.0 (L) 36.0 - 46.0 %   MCV 103.4 (H) 80.0 - 100.0 fL   MCH 32.3 26.0 - 34.0 pg   MCHC 31.2 30.0 - 36.0 g/dL   RDW 15.6 (H) 11.5 - 15.5 %   Platelets 172 150 - 400 K/uL   nRBC 0.0 0.0 - 0.2 %   Neutrophils Relative % 80 %   Neutro Abs 8.7 (H) 1.7 - 7.7 K/uL   Lymphocytes Relative 8 %   Lymphs Abs 0.8 0.7 - 4.0 K/uL   Monocytes Relative 7 %   Monocytes Absolute  0.8 0.1 - 1.0 K/uL   Eosinophils Relative 3 %   Eosinophils Absolute 0.4 0.0 - 0.5 K/uL   Basophils Relative 1 %   Basophils Absolute 0.1 0.0 - 0.1 K/uL   Immature Granulocytes 1 %   Abs Immature Granulocytes 0.05 0.00 - 0.07 K/uL    Comment: Performed at [facility], [address] [place], [place] [address]  Basic metabolic panel     Status: Abnormal   Collection Time: 05/09/19  4:55 AM  Result Value Ref Range   Sodium 140 135 - 145 mmol/L   Potassium 4.1 3.5 - 5.1 mmol/L   Chloride 108 98 - 111 mmol/L   CO2 15 (L) 22 - 32 mmol/L   Glucose, Bld 112 (H) 70 - 99 mg/dL   BUN 86 (H) 8 - 23 mg/dL   Creatinine, Ser 8.99 (H) 0.44 - 1.00 mg/dL   Calcium 9.1 8.9 - 10.3 mg/dL   GFR calc non Af Amer 4 (L) >60 mL/min   GFR calc Af Amer 5 (L) >60 mL/min   Anion gap 17 (H) 5 - 15    Comment: Performed at [facility] [address], [place], [place] [address]  Troponin I - ONCE - STAT     Status: Abnormal   Collection Time: 05/09/19  4:55 AM  Result Value Ref Range   Troponin I 0.17 (HH) <0.03 ng/mL    Comment: CRITICAL RESULT CALLED TO, READ BACK BY AND VERIFIED WITH: Augustina Mood [id number] [facility] Performed at [facility], [place] [address] [place], [place] [address]   Brain natriuretic peptide     Status: Abnormal   Collection Time: 05/09/19  4:55 AM  Result Value Ref Range   B Natriuretic Peptide 3,871.3 (H) 0.0 - 100.0 pg/mL    Comment: Performed at [facility] [address], [place], [place] [address]  SARS Coronavirus 2 (CEPHEID- Performed in [facility] hospital lab), Hosp Order     Status: None   Collection Time: 05/09/19  6:22 AM   Specimen: Nasopharyngeal Swab  Result Value Ref Range   SARS Coronavirus 2 NEGATIVE NEGATIVE    Comment: (NOTE) If result is NEGATIVE SARS-CoV-2 target nucleic acids are NOT DETECTED. The SARS-CoV-2 RNA is generally detectable in upper and lower  respiratory specimens during the acute phase of infection. The  lowest  concentration of SARS-CoV-2  viral copies this assay can detect is 250  copies / mL. A negative result does not preclude SARS-CoV-2 infection  and should not be used as the sole basis for treatment or other  patient management decisions.  A negative result may occur with  improper specimen collection / handling, submission of specimen other  than nasopharyngeal swab, presence of viral mutation(s) within the  areas targeted by this assay, and inadequate number of viral copies  (<250 copies / mL). A negative result must be combined with clinical  observations, patient history, and epidemiological information. If result is POSITIVE SARS-CoV-2 target nucleic acids are DETECTED. The SARS-CoV-2 RNA is generally detectable in upper and lower  respiratory specimens dur ing the acute phase of infection.  Positive  results are indicative of active infection with SARS-CoV-2.  Clinical  correlation with patient history and other diagnostic information is  necessary to determine patient infection status.  Positive results do  not rule out bacterial infection or co-infection with other viruses. If result is PRESUMPTIVE POSTIVE SARS-CoV-2 nucleic acids MAY BE PRESENT.   A presumptive positive result was obtained on the submitted specimen  and confirmed on repeat testing.  While 2019 novel coronavirus  (SARS-CoV-2) nucleic acids may be present in the submitted sample  additional confirmatory testing may be necessary for epidemiological  and / or clinical management purposes  to differentiate between  SARS-CoV-2 and other Sarbecovirus currently known to infect humans.  If clinically indicated additional testing with an alternate test  methodology [phone]) is advised. The SARS-CoV-2 RNA is generally  detectable in upper and lower respiratory sp ecimens during the acute  phase of infection. The expected result is Negative. Fact Sheet for Patients:   StrictlyIdeas.no Fact Sheet for Healthcare Providers: BankingDealers.co.za This test is not yet approved or cleared by the [place] FDA and has been authorized for detection and/or diagnosis of SARS-CoV-2 by FDA under an Emergency Use Authorization (EUA).  This EUA will remain in effect (meaning this test can be used) for the duration of the COVID-19 declaration under Section 564(b)(1) of the Act, 21 U.S.C. section 360bbb-3(b)(1), unless the authorization is terminated or revoked sooner. Performed at [facility], [place] [address], [place], [place] [address]   Troponin I - Now Then Q6H     Status: Abnormal   Collection Time: 05/09/19  9:40 AM  Result Value Ref Range   Troponin I 0.18 (HH) <0.03 ng/mL    Comment: CRITICAL RESULT CALLED TO, READ BACK BY AND VERIFIED WITH: JAMIE BLUE,RN AT 5638 05/09/2019 BY ZBEECH, Performed at [facility], [place] [address] [place], [place] [address]    Dg Chest Portable 1 View  Result Date: 05/09/2019 CLINICAL DATA:  Worsening shortness of breath EXAM: PORTABLE CHEST 1 VIEW COMPARISON:  05/08/2019 FINDINGS: Normal heart size. Aortic tortuosity. There has been coronary stenting. Postoperative left lung with volume loss. Biapical emphysema. There is no edema, consolidation, effusion, or pneumothorax. IMPRESSION: Postoperative chest with emphysema. No acute finding when compared to prior. Electronically Signed   By: Monte Fantasia M.D.   On: 05/09/2019 05:25   Dg Chest Port 1 View  Result Date: 05/08/2019 CLINICAL DATA:  Dyspnea. EXAM: PORTABLE CHEST 1 VIEW COMPARISON:  Radiograph of Mar 31, 2019. FINDINGS: Stable cardiomediastinal silhouette. Atherosclerosis of thoracic aorta is noted. No pneumothorax or pleural effusion is noted. No acute pulmonary disease is noted. Bony thorax is unremarkable. IMPRESSION: No acute cardiopulmonary abnormality seen. Aortic Atherosclerosis (ICD10-I70.0).  Electronically Signed  By: Marijo Conception M.D.   On: 05/08/2019 09:21    Pending Labs Unresulted Labs (From admission, onward)    Start     Ordered   05/10/19 0500  CBC  Tomorrow morning,   R     05/09/19 0750   05/10/19 0500  Renal function panel  Tomorrow morning,   R     05/09/19 0750   05/09/19 0810  Urinalysis, Routine w reflex microscopic  Once,   STAT     05/09/19 0809   05/09/19 0752  Troponin I - Now Then Q6H  Now then every 6 hours,   R (with STAT occurrences)     05/09/19 0752          Vitals/Pain Today's Vitals   05/09/19 0900 05/09/19 0927 05/09/19 0928 05/09/19 1200  BP: (!) 196/138   (!) 172/87  Pulse: (!) 37   75  Resp: (!) 23   20  Temp:      SpO2: 99% 100% 100% 98%  PainSc:        Isolation Precautions Droplet and Contact precautions  Medications Medications  aspirin EC tablet 81 mg (81 mg Oral Given 05/09/19 1031)  atorvastatin (LIPITOR) tablet 40 mg (40 mg Oral Given 05/09/19 1031)  lidocaine-prilocaine (EMLA) cream 1 application (has no administration in time range)  clopidogrel (PLAVIX) tablet 75 mg (75 mg Oral Given 05/09/19 1032)  ferric citrate (AURYXIA) tablet 210 mg (210 mg Oral Given 05/09/19 1209)  QUEtiapine (SEROQUEL) tablet 200 mg (has no administration in time range)  isosorbide mononitrate (IMDUR) 24 hr tablet 30 mg (has no administration in time range)  heparin injection 5,000 Units (5,000 Units Subcutaneous Given 05/09/19 1031)  sodium chloride flush (NS) 0.9 % injection 3 mL (3 mLs Intravenous Given 05/09/19 1035)  acetaminophen (TYLENOL) tablet 650 mg (has no administration in time range)    Or  acetaminophen (TYLENOL) suppository 650 mg (has no administration in time range)  ondansetron (ZOFRAN) tablet 4 mg (has no administration in time range)    Or  ondansetron (ZOFRAN) injection 4 mg (has no administration in time range)  albuterol (PROVENTIL) (2.5 MG/3ML) 0.083% nebulizer solution 2.5 mg (2.5 mg Nebulization Given 05/09/19 1152)   budesonide (PULMICORT) nebulizer solution 0.5 mg (0.5 mg Nebulization Given 05/09/19 0927)  arformoterol (BROVANA) nebulizer solution 15 mcg (15 mcg Nebulization Not Given 05/09/19 0929)  guaiFENesin (MUCINEX) 12 hr tablet 600 mg (600 mg Oral Given 05/09/19 1031)  cefTRIAXone (ROCEPHIN) 1 g in sodium chloride 0.9 % 100 mL IVPB ( Intravenous Stopped 05/09/19 1107)  predniSONE (DELTASONE) tablet 40 mg (has no administration in time range)  hydrALAZINE (APRESOLINE) injection 10 mg (has no administration in time range)  Chlorhexidine Gluconate Cloth 2 % PADS 6 each (6 each Topical Not Given 05/09/19 1317)  cinacalcet (SENSIPAR) tablet 30 mg (has no administration in time range)  calcitRIOL (ROCALTROL) capsule 1.25 mcg (has no administration in time range)  methylPREDNISolone sodium succinate (SOLU-MEDROL) 125 mg/2 mL injection 125 mg (125 mg Intravenous Given 05/09/19 0621)  albuterol (VENTOLIN HFA) 108 (90 Base) MCG/ACT inhaler 10 puff (10 puffs Inhalation Given 05/09/19 0616)  furosemide (LASIX) injection 40 mg (40 mg Intravenous Given 05/09/19 0629)  nitroGLYCERIN (NITROGLYN) 2 % ointment 1 inch (1 inch Topical Given 05/09/19 0631)    Mobility walks with device High fall risk   Focused Assessments Cardiac Assessment Handoff:    Lab Results  Component Value Date   TROPONINI 0.18 ([facility]) 05/09/2019   No results found  for: DDIMER Does the Patient currently have chest pain? No     R Recommendations: See Admitting Provider Note  Report given to:   Additional Notes:  Elevated [place]

## 2019-05-09 NOTE — H&P (Signed)
History and Physical    Kari Robinson ZOX:[id number] DOB: 08/16/1951 DOA: 05/09/2019  Referring MD/NP/PA: Ivor Costa, MD PCP: Patient, [name]  Patient coming from: Home via EMS  Chief Complaint: Shortness of breath  I have personally briefly reviewed patient's old medical records in [place]   HPI: Kari Robinson is a 68 y.o. female with medical history significant of ESRD on HD (MWF),hypertension, hyperlipidemia, COPD on 2 L oxygen as needed, CHF with EF of 40%, depression, tobacco abuse, alcohol abuse in remission, CAD, and carotid artery stenosis; who presented with complaints of progressively worsening shortness of breath over the last 3 days.  She was seen in the emergency department yesterday for the same yesterday, but was able to be discharged home.  Says she was here in the emergency department she missed her regularly scheduled hemodialysis session.  She complains of having a nonproductive cough that is worse than usual.  Tried utilizing home inhalers without any significant relief of symptoms.  Normally she only uses oxygen as needed, but had been requiring it 24/7 over the last few days.  Denies having any significant fever, chest pain, loss of consciousness, nausea, or vomiting.  Patient reports smoking 4 packs of cigarettes per day for approximately 68 years, but quit 1 month ago cold [place].  Other associated symptoms include back pain with bilateral leg pain.  Previously, evaluated in May by after MRI revealed L4-L5 lumbar stenosis by Dr. Lelon Perla neurosurgery, but she declined any surgical procedure at that time.  She now states that symptoms are so severe that she she would like to be seen by neurosurgery during this hospitalization.  ED Course: Upon admission into the emergency department patient was noted to be afebrile, pulse 99-1 11, respirations 12-27, blood pressures 135/94 -171/125, and O2 saturations 95-99% on 3.5 L nasal cannula oxygen.  Labs revealed WBC  10.8, hemoglobin 10.3, potassium 4.1, CO2 15, BUN 86, creatinine 8.99, anion gap 17, BNP 3871.3, and troponin 0.017.  Chest x-ray showed emphysematous changes of the lung without any other acute abnormality noted.  Patient was ordered 125 mg of Solu-Medrol IV, 40 mg of Lasix IV, and showed nitroglycerin ointment, and 10 puffs of albuterol inhaler.  TRH called to admit  Review of Systems  Constitutional: Positive for malaise/fatigue. Negative for chills and fever.  HENT: Negative for congestion and nosebleeds.   Eyes: Negative for photophobia and pain.  Respiratory: Positive for cough, shortness of breath and wheezing.   Cardiovascular: Negative for chest pain and leg swelling.  Gastrointestinal: Negative for abdominal pain, nausea and vomiting.  Genitourinary: Negative for dysuria and frequency.  Musculoskeletal: Positive for back pain and neck pain. Negative for falls.  Skin: Negative for itching.  Neurological: Positive for tingling and weakness. Negative for loss of consciousness.  Psychiatric/Behavioral: Negative for substance abuse. The patient does not have insomnia.     Past Medical History:  Diagnosis Date  . Acute on chronic heart failure ([place])   . Acute respiratory failure with hypoxia ([place])   . Alcohol abuse   . Arthralgia   . CAD (coronary artery disease)    moderate primarily septal perforater  . CHF (congestive heart failure) ([place])   . COPD (chronic obstructive pulmonary disease) ([place])   . Depression   . HTN (hypertension)   . Hypercholesterolemia    primarily ldl-p and small particles  . RAS (renal artery stenosis) ([place]) 2002   by cath tysinger  . Renal disorder   . Smoking  Past Surgical History:  Procedure Laterality Date  . abdominal aortogram     perclose of the right femoral artery  . AV FISTULA PLACEMENT Right 07/11/2018   Procedure: Right arm ARTERIOVENOUS FISTULA CREATION;  Surgeon: [name], MD;  Location: [facility];  Service: Vascular;   Laterality: Right;  . AVF placement Right   . CARDIAC CATHETERIZATION     left heart catheterization.  Coronary cineangiography. Lft ventricular cineangiography.    [name] LUNG SURGERY     due to lung cancer per patient's report  . TUBAL LIGATION       reports that she has been smoking cigarettes. She has smoked for the past 35.00 years. She has never used smokeless tobacco. She reports that she does not drink alcohol or use drugs.  Allergies  Allergen Reactions  . Citalopram Hydrobromide Hives  . Morphine And Related Itching    Family History  Problem Relation Age of Onset  . Emphysema Father     Prior to Admission medications   Medication Sig Start Date End Date Taking? Authorizing Provider  albuterol (PROVENTIL HFA;VENTOLIN HFA) 108 (90 Base) MCG/ACT inhaler Inhale 2 puffs into the lungs every 4 (four) hours as needed for wheezing.  11/25/17  Yes [provider]  albuterol (PROVENTIL) (2.5 MG/3ML) 0.083% nebulizer solution Take 2.5 mg by nebulization every 6 (six) hours as needed for wheezing or shortness of breath.  07/28/18  Yes [provider]  aspirin EC 81 MG tablet Take 81 mg by mouth daily.   Yes [provider]  atorvastatin (LIPITOR) 40 MG tablet Take 40 mg by mouth daily.   Yes [provider]  b complex-vitamin c-folic acid (NEPHRO-VITE) 0.8 MG TABS tablet Take 1 tablet by mouth See admin instructions. Take one tablet by mouth on Sunday, Tuesday, Thursday, Saturday mornings. Take one tablet after dialysis on Monday, Wednesday, Friday 02/06/19  Yes [provider]  budesonide-formoterol (SYMBICORT) 160-4.5 MCG/ACT inhaler Inhale 2 puffs into the lungs 2 (two) times daily.   Yes [provider]  clopidogrel (PLAVIX) 75 MG tablet Take 75 mg by mouth daily.   Yes [provider]  diclofenac sodium (VOLTAREN) 1 % GEL Apply 2 g topically 4 (four) times daily. 02/27/19  Yes Hedges, Dellis Filbert, PA-C  isosorbide mononitrate  (IMDUR) 30 MG 24 hr tablet Take 30 mg by mouth at bedtime.    Yes [provider]  OXYGEN Inhale 2 L into the lungs continuous.   Yes [provider]  SEROQUEL 200 MG tablet Take 200 mg by mouth at bedtime. 04/05/19  Yes [provider]  AURYXIA 1 GM 210 MG(Fe) tablet Take 1 tablet by mouth 3 (three) times daily with meals. 04/06/19   [provider]  hydrALAZINE (APRESOLINE) 50 MG tablet Take 1 tablet (50 mg total) by mouth 3 (three) times daily for 30 days. Patient not taking: Reported on 05/09/2019 04/02/19 05/02/19  Cherylann Ratel A, DO  lidocaine-prilocaine (EMLA) cream Apply 1 application topically every Monday, Wednesday, and Friday. Prior to dialysis 04/06/19   [provider]    Physical Exam:  Constitutional: Elderly appearing female who appears to be in acute respiratory distress Vitals:   05/09/19 0458 05/09/19 0616  BP: (!) 171/125   Pulse: (!) 104 (!) 111  Resp: 18 (!) 26  Temp: 98 F (36.7 C)   SpO2: 95% 98%   Eyes: PERRL, lids and conjunctivae normal ENMT: Mucous membranes are moist. Posterior pharynx clear of any exudate or lesions.  Neck: normal, supple, no masses, no thyromegaly Respiratory:  Tachypneic with mild expiratory wheeze appreciated.  Lung sounds.  Lung sounds decreased on the right lower lung field. Cardiovascular: Regular rate and rhythm, no murmurs / rubs / gallops. No extremity edema. 2+ pedal pulses. No carotid bruits.  Abdomen: no tenderness, no masses palpated. No hepatosplenomegaly. Bowel sounds positive.  Musculoskeletal: no clubbing / cyanosis. No joint deformity upper and lower extremities. Good ROM, no contractures. Normal muscle tone.  Skin: no rashes, lesions, ulcers. No induration Neurologic: CN 2-12 grossly intact. Sensation intact, DTR normal. Strength 5/5 in all 4.  Psychiatric: Normal judgment and insight. Alert and oriented x 3. Normal mood.     Labs on Admission: I have personally reviewed  following labs and imaging studies  CBC: Recent Labs  Lab 05/08/19 0906 05/09/19 0455  WBC 10.8* 10.8*  NEUTROABS 8.9* 8.7*  HGB 10.8* 10.3*  HCT 34.9* 33.0*  MCV 103.6* 103.4*  PLT 172 166   Basic Metabolic Panel: Recent Labs  Lab 05/08/19 0906 05/09/19 0455  NA 140 140  K 4.1 4.1  CL 105 108  CO2 18* 15*  GLUCOSE 103* 112*  BUN 76* 86*  CREATININE 8.69* 8.99*  CALCIUM 9.5 9.1   GFR: CrCl cannot be calculated (Unknown ideal weight.). Liver Function Tests: No results for input(s): AST, ALT, ALKPHOS, BILITOT, PROT, ALBUMIN in the last 168 hours. No results for input(s): LIPASE, AMYLASE in the last 168 hours. No results for input(s): AMMONIA in the last 168 hours. Coagulation Profile: No results for input(s): INR, PROTIME in the last 168 hours. Cardiac Enzymes: Recent Labs  Lab 05/09/19 0455  TROPONINI 0.17*   BNP (last 3 results) No results for input(s): PROBNP in the last 8760 hours. HbA1C: No results for input(s): HGBA1C in the last 72 hours. CBG: No results for input(s): GLUCAP in the last 168 hours. Lipid Profile: No results for input(s): CHOL, HDL, LDLCALC, TRIG, CHOLHDL, LDLDIRECT in the last 72 hours. Thyroid Function Tests: No results for input(s): TSH, T4TOTAL, FREET4, T3FREE, THYROIDAB in the last 72 hours. Anemia Panel: No results for input(s): VITAMINB12, FOLATE, FERRITIN, TIBC, IRON, RETICCTPCT in the last 72 hours. Urine analysis:    Component Value Date/Time   COLORURINE STRAW (A) 02/25/2019 0735   APPEARANCEUR CLEAR 02/25/2019 0735   LABSPEC 1.004 (L) 02/25/2019 0735   PHURINE 9.0 (H) 02/25/2019 0735   GLUCOSEU NEGATIVE 02/25/2019 0735   HGBUR SMALL (A) 02/25/2019 0735   BILIRUBINUR NEGATIVE 02/25/2019 [place] 02/25/2019 0735   PROTEINUR 100 (A) 02/25/2019 0735   NITRITE NEGATIVE 02/25/2019 0735   LEUKOCYTESUR NEGATIVE 02/25/2019 0735   Sepsis Labs: No results found for this or any previous visit (from the past 240  hour(s)).   Radiological Exams on Admission: Dg Chest Portable 1 View  Result Date: 05/09/2019 CLINICAL DATA:  Worsening shortness of breath EXAM: PORTABLE CHEST 1 VIEW COMPARISON:  05/08/2019 FINDINGS: Normal heart size. Aortic tortuosity. There has been coronary stenting. Postoperative left lung with volume loss. Biapical emphysema. There is no edema, consolidation, effusion, or pneumothorax. IMPRESSION: Postoperative chest with emphysema. No acute finding when compared to prior. Electronically Signed   By: Monte Fantasia M.D.   On: 05/09/2019 05:25   Dg Chest Port 1 View  Result Date: 05/08/2019 CLINICAL DATA:  Dyspnea. EXAM: PORTABLE CHEST 1 VIEW COMPARISON:  Radiograph of Mar 31, 2019. FINDINGS: Stable cardiomediastinal silhouette. Atherosclerosis of thoracic aorta is noted. No pneumothorax or pleural effusion is noted. No acute  pulmonary disease is noted. Bony thorax is unremarkable. IMPRESSION: No acute cardiopulmonary abnormality seen. Aortic Atherosclerosis (ICD10-I70.0). Electronically Signed   By: Marijo Conception M.D.   On: 05/08/2019 09:21    EKG: Independently reviewed.  From 6/22 noting sinus tachycardia at 100 bpm with QTc 520  Assessment/Plan Respiratory failure with hypoxia, COPD exacerbation: Acute on chronic.  Patient presents with progressively worsening shortness of breath.  Normally only on 2 L of nasal cannula oxygen as needed.  Chest x-ray showing postoperative chest with emphysema unchanged from previous day.  -Admit to a telemetry bed -Focused COPD order set initiated -Budesonide and Brovana nebs twice daily -Albuterol nebs 4 times daily and as needed -Empiric antibiotics of Rocephin IV -Prednisone 40 mg daily starting 6/24 -Mucinex -PT/OT to evaluate and treat  ESRD on HD: Patient normally dialyzes Monday, Wednesday, Friday.  Reports missing hemodialysis session yesterday.  Potassium 4.1, BUN 86, and creatinine 8.99.  Patient still reports making some urine. -Dr.  Jonnie Finner of nephrology consulted, will follow for any further recommendation  Elevated troponin, coronary artery disease: Acute on chronic.  On admission troponin elevated up to 0.17.  Patient troponin appears chronically elevated.  She currently denies having any chest pain symptoms.  Suspect secondary to acute respiratory distress. -Trend cardiac troponin -Continue aspirin, Plavix, statin, and isosorbide mononitrate  Systolic congestive heart failure:.  Last EF noted to be 40 to 45% by echocardiogram on 02/25/2019.  On admission BNP elevated at 3871.3.  No frank edema noted on the chest x-ray, but suspect some aspect of fluid overload. -Strict intake and output  Leukocytosis: WBC elevated at 10.8.  Patient otherwise afebrile and chest x-ray without any acute abnormalities. -Recheck CBC in a.m.  L4-L5 lumbar stenosis and sciatica: Patient reports having severe pain in her lower legs.  Previous MRI noting lumbar stenosis for which she was evaluated by neurosurgery during her May hospitalization. -Reconsult [place] of neurosurgery once patient acute symptoms improved  Essential hypertension: On admission blood pressures elevated to 181/111. -Continue isosorbide mononitrate  Hyperlipidemia -Continue atorvastatin  Anemia of chronic disease: Hemoglobin 10.3 g/dL which appears near patient's baseline. -Continue ferric citrate  History of Lung cancer: Patient status post left lobectomy  Tobacco abuse: Patient reports smoking 4 packs of cigarettes per day on average for least 50 years, but just quit 1 month ago. -Encourage continued cessation of tobacco use  DVT prophylaxis: heparin  Code Status: Full Family Communication: No family present at bedside. Disposition Plan: Possibly discharge home in 1 to 2 days Consults called: Nephrology Admission status: Observation  Norval Morton MD [facility] Pager [phone]   If 7PM-7AM, please contact night-coverage [email or web address]  Password [facility]  05/09/2019, 7:25 AM

## 2019-05-09 NOTE — ED Triage Notes (Signed)
Pt arrives from home via [facility] for c/o worsening sob, pt was seen in ED yesterday and discharged home, missed dialysis yesterday. Wears O2 PRN, pt reports wearing 3L O2 r/t feeling so sob. Pt a/ox4, resp e/u, nad.

## 2019-05-09 NOTE — Consult Note (Addendum)
[facility] Renal Consultation Note    Indication for Consultation:  Management of ESRD/hemodialysis; anemia, hypertension/volume and secondary hyperparathyroidism  HPI: Kari Robinson is a 68 y.o. female with ESRD on HD, HTN, CHF, CAD,  HLD, COPD.  Admitted under observation status for worsening SOB. Was seen in ED yesterday with similar presentation and was advised to go to her regular outpatient dialysis but she did not. Has some complaints about back pain from sitting in HD chair.   CXR shows biapical emphysema. No edema or infiltrates. Na 140, K 4.1, CO2 15, BUN 86 Cr 8.99, WBC 10.8 Hgb 10.3, BNP 3871. SARS-CoV-2 rapid test negative.   Seen and examined in ED. Tachycardic and tachypneic.  Breathing better after receiving nebulizer treatment ED. O2 sats 99% on 2L [place]. Uses O2 at home intermittently. Says does not have nebulizer at home.   [place] MWF via R AVF. HD initiated Feb 2020. She is unclear about the cause of her kidney disease.  Last HD Friday 6/19.   Past Medical History:  Diagnosis Date  . Acute on chronic heart failure ([place])   . Acute respiratory failure with hypoxia ([place])   . Alcohol abuse   . Arthralgia   . CAD (coronary artery disease)    moderate primarily septal perforater  . CHF (congestive heart failure) ([place])   . COPD (chronic obstructive pulmonary disease) ([place])   . Depression   . HTN (hypertension)   . Hypercholesterolemia    primarily ldl-p and small particles  . RAS (renal artery stenosis) ([place]) 2002   by cath tysinger  . Renal disorder   . Smoking    Past Surgical History:  Procedure Laterality Date  . abdominal aortogram     perclose of the right femoral artery  . AV FISTULA PLACEMENT Right 07/11/2018   Procedure: Right arm ARTERIOVENOUS FISTULA CREATION;  Surgeon: [name], MD;  Location: [place];  Service: Vascular;  Laterality: Right;  . AVF placement Right   . CARDIAC CATHETERIZATION     left heart catheterization.  Coronary cineangiography. Lft ventricular cineangiography.    [name] LUNG SURGERY     due to lung cancer per patient's report  . TUBAL LIGATION     Family History  Problem Relation Age of Onset  . Emphysema Father    Social History:  reports that she has been smoking cigarettes. She has smoked for the past 35.00 years. She has never used smokeless tobacco. She reports that she does not drink alcohol or use drugs. Allergies  Allergen Reactions  . Citalopram Hydrobromide Hives  . Morphine And Related Itching   Prior to Admission medications   Medication Sig Start Date End Date Taking? Authorizing Provider  albuterol (PROVENTIL HFA;VENTOLIN HFA) 108 (90 Base) MCG/ACT inhaler Inhale 2 puffs into the lungs every 4 (four) hours as needed for wheezing.  11/25/17  Yes [provider]  albuterol (PROVENTIL) (2.5 MG/3ML) 0.083% nebulizer solution Take 2.5 mg by nebulization every 6 (six) hours as needed for wheezing or shortness of breath.  07/28/18  Yes [provider]  aspirin EC 81 MG tablet Take 81 mg by mouth daily.   Yes [provider]  atorvastatin (LIPITOR) 40 MG tablet Take 40 mg by mouth daily.   Yes [provider]  b complex-vitamin c-folic acid (NEPHRO-VITE) 0.8 MG TABS tablet Take 1 tablet by mouth See admin instructions. Take one tablet by mouth on Sunday, Tuesday, Thursday, Saturday mornings. Take one tablet  after dialysis on Monday, Wednesday, Friday 02/06/19  Yes [provider]  budesonide-formoterol (SYMBICORT) 160-4.5 MCG/ACT inhaler Inhale 2 puffs into the lungs 2 (two) times daily.   Yes [provider]  clopidogrel (PLAVIX) 75 MG tablet Take 75 mg by mouth daily.   Yes [provider]  diclofenac sodium (VOLTAREN) 1 % GEL Apply 2 g topically 4 (four) times daily. 02/27/19  Yes Hedges, Dellis Filbert, PA-C  isosorbide mononitrate (IMDUR) 30 MG 24 hr tablet Take 30 mg by mouth at bedtime.    Yes  [provider]  OXYGEN Inhale 2 L into the lungs continuous.   Yes [provider]  SEROQUEL 200 MG tablet Take 200 mg by mouth at bedtime. 04/05/19  Yes [provider]  AURYXIA 1 GM 210 MG(Fe) tablet Take 1 tablet by mouth 3 (three) times daily with meals. 04/06/19   [provider]  hydrALAZINE (APRESOLINE) 50 MG tablet Take 1 tablet (50 mg total) by mouth 3 (three) times daily for 30 days. Patient not taking: Reported on 05/09/2019 04/02/19 05/02/19  Cherylann Ratel A, DO  lidocaine-prilocaine (EMLA) cream Apply 1 application topically every Monday, Wednesday, and Friday. Prior to dialysis 04/06/19   [provider]   Current Facility-Administered Medications  Medication Dose Route Frequency Provider Last Rate Last Dose  . acetaminophen (TYLENOL) tablet 650 mg  650 mg Oral Q6H PRN Fuller Plan A, MD       Or  . acetaminophen (TYLENOL) suppository 650 mg  650 mg Rectal Q6H PRN Tamala Julian, Rondell A, MD      . albuterol (PROVENTIL) (2.5 MG/3ML) 0.083% nebulizer solution 2.5 mg  2.5 mg Nebulization QID Fuller Plan A, MD   2.5 mg at 05/09/19 0927  . arformoterol (BROVANA) nebulizer solution 15 mcg  15 mcg Nebulization BID Fuller Plan A, MD      . aspirin EC tablet 81 mg  81 mg Oral Daily Smith, Rondell A, MD      . atorvastatin (LIPITOR) tablet 40 mg  40 mg Oral Daily Smith, Rondell A, MD      . budesonide (PULMICORT) nebulizer solution 0.5 mg  0.5 mg Nebulization BID Tamala Julian, Rondell A, MD   0.5 mg at 05/09/19 0927  . cefTRIAXone (ROCEPHIN) 1 g in sodium chloride 0.9 % 100 mL IVPB  1 g Intravenous Q24H Smith, Rondell A, MD      . Chlorhexidine Gluconate Cloth 2 % PADS 6 each  6 each Topical Q0600 Lynnda Child, PA-C      . clopidogrel (PLAVIX) tablet 75 mg  75 mg Oral Daily Smith, Rondell A, MD      . ferric citrate (AURYXIA) tablet 210 mg  210 mg Oral TID WC Smith, Rondell A, MD      . guaiFENesin (MUCINEX) 12 hr tablet 600 mg  600 mg Oral BID  Smith, Rondell A, MD      . heparin injection 5,000 Units  5,000 Units Subcutaneous Q8H Smith, Rondell A, MD      . hydrALAZINE (APRESOLINE) injection 10 mg  10 mg Intravenous Q4H PRN Smith, Rondell A, MD      . isosorbide mononitrate (IMDUR) 24 hr tablet 30 mg  30 mg Oral QHS Smith, Rondell A, MD      . Derrill Memo ON 05/10/2019] lidocaine-prilocaine (EMLA) cream 1 application  1 application Topical Q M,W,F Smith, Rondell A, MD      . ondansetron (ZOFRAN) tablet 4 mg  4 mg Oral Q6H PRN Tamala Julian, Rondell A,  MD       Or  . ondansetron (ZOFRAN) injection 4 mg  4 mg Intravenous Q6H PRN Norval Morton, MD      . Derrill Memo ON 05/10/2019] predniSONE (DELTASONE) tablet 40 mg  40 mg Oral Q breakfast Smith, Rondell A, MD      . QUEtiapine (SEROQUEL) tablet 200 mg  200 mg Oral QHS Smith, Rondell A, MD      . sodium chloride flush (NS) 0.9 % injection 3 mL  3 mL Intravenous Q12H Norval Morton, MD       Current Outpatient Medications  Medication Sig Dispense Refill  . albuterol (PROVENTIL HFA;VENTOLIN HFA) 108 (90 Base) MCG/ACT inhaler Inhale 2 puffs into the lungs every 4 (four) hours as needed for wheezing.     [name] albuterol (PROVENTIL) (2.5 MG/3ML) 0.083% nebulizer solution Take 2.5 mg by nebulization every 6 (six) hours as needed for wheezing or shortness of breath.   1  . aspirin EC 81 MG tablet Take 81 mg by mouth daily.    [name] atorvastatin (LIPITOR) 40 MG tablet Take 40 mg by mouth daily.    [name] b complex-vitamin c-folic acid (NEPHRO-VITE) 0.8 MG TABS tablet Take 1 tablet by mouth See admin instructions. Take one tablet by mouth on Sunday, Tuesday, Thursday, Saturday mornings. Take one tablet after dialysis on Monday, Wednesday, Friday    . budesonide-formoterol (SYMBICORT) 160-4.5 MCG/ACT inhaler Inhale 2 puffs into the lungs 2 (two) times daily.    . clopidogrel (PLAVIX) 75 MG tablet Take 75 mg by mouth daily.    . diclofenac sodium (VOLTAREN) 1 % GEL Apply 2 g topically 4 (four) times daily. 100 g 0  .  isosorbide mononitrate (IMDUR) 30 MG 24 hr tablet Take 30 mg by mouth at bedtime.     . OXYGEN Inhale 2 L into the lungs continuous.    . SEROQUEL 200 MG tablet Take 200 mg by mouth at bedtime.    Lorin Picket 1 GM 210 MG(Fe) tablet Take 1 tablet by mouth 3 (three) times daily with meals.    . hydrALAZINE (APRESOLINE) 50 MG tablet Take 1 tablet (50 mg total) by mouth 3 (three) times daily for 30 days. (Patient not taking: Reported on 05/09/2019) 90 tablet 0  . lidocaine-prilocaine (EMLA) cream Apply 1 application topically every Monday, Wednesday, and Friday. Prior to dialysis       ROS: As per HPI otherwise negative.  Physical Exam: Vitals:   05/09/19 0830 05/09/19 0900 05/09/19 0927 05/09/19 0928  BP: (!) 183/118 (!) 196/138    Pulse: (!) 39 (!) 37    Resp: (!) 23 (!) 23    Temp:      SpO2: 99% 99% 100% 100%     General: WNWD female on nasal oxygen NAD  Head: NCAT sclera not icteric MMM Neck: Supple. No JVD No masses Lungs: CTA bilaterally without wheezes, rales, or rhonchi.  Heart: RRR with S1 S2 Abdomen: soft NT + BS Lower extremities:without edema or ischemic changes, no open wounds  Neuro: A & O  X 3. Moves all extremities spontaneously. Psych:  Responds to questions appropriately with a normal affect. Dialysis Access: R forearm AVF +bruit   Labs: Basic Metabolic Panel: Recent Labs  Lab 05/08/19 0906 05/09/19 0455  NA 140 140  K 4.1 4.1  CL 105 108  CO2 18* 15*  GLUCOSE 103* 112*  BUN 76* 86*  CREATININE 8.69* 8.99*  CALCIUM 9.5 9.1   Liver Function Tests: No results  for input(s): AST, ALT, ALKPHOS, BILITOT, PROT, ALBUMIN in the last 168 hours. No results for input(s): LIPASE, AMYLASE in the last 168 hours. No results for input(s): AMMONIA in the last 168 hours. CBC: Recent Labs  Lab 05/08/19 0906 05/09/19 0455  WBC 10.8* 10.8*  NEUTROABS 8.9* 8.7*  HGB 10.8* 10.3*  HCT 34.9* 33.0*  MCV 103.6* 103.4*  PLT 172 172   Cardiac Enzymes: Recent Labs  Lab  05/09/19 0455  TROPONINI 0.17*   CBG: No results for input(s): GLUCAP in the last 168 hours. Iron Studies: No results for input(s): IRON, TIBC, TRANSFERRIN, FERRITIN in the last 72 hours. Studies/Results: Dg Chest Portable 1 View  Result Date: 05/09/2019 CLINICAL DATA:  Worsening shortness of breath EXAM: PORTABLE CHEST 1 VIEW COMPARISON:  05/08/2019 FINDINGS: Normal heart size. Aortic tortuosity. There has been coronary stenting. Postoperative left lung with volume loss. Biapical emphysema. There is no edema, consolidation, effusion, or pneumothorax. IMPRESSION: Postoperative chest with emphysema. No acute finding when compared to prior. Electronically Signed   By: Monte Fantasia M.D.   On: 05/09/2019 05:25   Dg Chest Port 1 View  Result Date: 05/08/2019 CLINICAL DATA:  Dyspnea. EXAM: PORTABLE CHEST 1 VIEW COMPARISON:  Radiograph of Mar 31, 2019. FINDINGS: Stable cardiomediastinal silhouette. Atherosclerosis of thoracic aorta is noted. No pneumothorax or pleural effusion is noted. No acute pulmonary disease is noted. Bony thorax is unremarkable. IMPRESSION: No acute cardiopulmonary abnormality seen. Aortic Atherosclerosis (ICD10-I70.0). Electronically Signed   By: Marijo Conception M.D.   On: 05/08/2019 09:21    Dialysis Orders:  [facility] M[facility] 3.5h 350/800 EDW 66.5kg 2K/2Ca R AVF 16g needles No heparin -Venofer 100mg  IV x 10 (until 6/24) -Calcitriol 1.25 mcg PO TIW  -Sensipar 30 PO TIW   Assessment/Plan: 1. Dyspnea/Probable COPD exacerbation - CXR with emphysema. Breathing improved with nebulizer treatment. Per primary  2. ESRD -  MWF - Missed HD Mon. HD today then back on schedule tomorrow.  3. Hypertension/volume  - BP high on admission. Hydaralzine/Imdur at home. Not grossly overloaded on exam. UF to EDW as tolerated  4. Anemia  - No ESA needs currently. Continue IV Fe here.  5. Metabolic bone disease -  Ca ok. Continue Calcitriol/Sensipar   Lynnda Child PA-C [facility] Pager [phone] 05/09/2019, 10:20 AM   Pt seen, examined and agree w A/P as above. Looks like this is more COPD than volume overload. If pt is OK for dc tomorrow will see if she would mind going to outpt unit for HD.  Kelly Splinter  MD 05/09/2019, 1:57 PM

## 2019-05-10 ENCOUNTER — Encounter (HOSPITAL_COMMUNITY): Payer: Self-pay | Admitting: Cardiology

## 2019-05-10 DIAGNOSIS — I160 Hypertensive urgency: Secondary | ICD-10-CM | POA: Diagnosis not present

## 2019-05-10 DIAGNOSIS — J441 Chronic obstructive pulmonary disease with (acute) exacerbation: Secondary | ICD-10-CM | POA: Diagnosis not present

## 2019-05-10 DIAGNOSIS — Z1159 Encounter for screening for other viral diseases: Secondary | ICD-10-CM | POA: Diagnosis not present

## 2019-05-10 DIAGNOSIS — I700 Atherosclerosis of aorta: Secondary | ICD-10-CM | POA: Diagnosis not present

## 2019-05-10 LAB — CBC
HCT: 28.5 % — ABNORMAL LOW (ref 36.0–46.0)
Hemoglobin: 9 g/dL — ABNORMAL LOW (ref 12.0–15.0)
MCH: 32.1 pg (ref 26.0–34.0)
MCHC: 31.6 g/dL (ref 30.0–36.0)
MCV: 101.8 fL — ABNORMAL HIGH (ref 80.0–100.0)
Platelets: 152 10*3/uL (ref 150–400)
RBC: 2.8 MIL/uL — ABNORMAL LOW (ref 3.87–5.11)
RDW: 15.9 % — ABNORMAL HIGH (ref 11.5–15.5)
WBC: 7.5 10*3/uL (ref 4.0–10.5)
nRBC: 0 % (ref 0.0–0.2)

## 2019-05-10 LAB — RENAL FUNCTION PANEL
Albumin: 3.2 g/dL — ABNORMAL LOW (ref 3.5–5.0)
Anion gap: 16 — ABNORMAL HIGH (ref 5–15)
BUN: 106 mg/dL — ABNORMAL HIGH (ref 8–23)
CO2: 18 mmol/L — ABNORMAL LOW (ref 22–32)
Calcium: 9 mg/dL (ref 8.9–10.3)
Chloride: 108 mmol/L (ref 98–111)
Creatinine, Ser: 10.23 mg/dL — ABNORMAL HIGH (ref 0.44–1.00)
GFR calc Af Amer: 4 mL/min — ABNORMAL LOW (ref >60)
GFR calc non Af Amer: 3 mL/min — ABNORMAL LOW (ref >60)
Glucose, Bld: 120 mg/dL — ABNORMAL HIGH (ref 70–99)
Phosphorus: 7.3 mg/dL — ABNORMAL HIGH (ref 2.5–4.6)
Potassium: 3.9 mmol/L (ref 3.5–5.1)
Sodium: 142 mmol/L (ref 135–145)

## 2019-05-10 LAB — GLUCOSE, CAPILLARY: Glucose-Capillary: 109 mg/dL — ABNORMAL HIGH (ref 70–99)

## 2019-05-10 LAB — TROPONIN I (HIGH SENSITIVITY)
Troponin I (High Sensitivity): 108 ng/L (ref <18)
Troponin I (High Sensitivity): 107 ng/L (ref <18)

## 2019-05-10 MED ORDER — SODIUM CHLORIDE 0.9 % IV SOLN: 100.0000 mL | INTRAVENOUS | Status: DC | PRN

## 2019-05-10 MED ORDER — IPRATROPIUM BROMIDE 0.02 % IN SOLN
0.5000 mg | Freq: Two times a day (BID) | RESPIRATORY_TRACT | Status: DC
  Administered 2019-05-11: 0.5 mg via RESPIRATORY_TRACT
  Filled 2019-05-10 (×2): qty 2.5

## 2019-05-10 MED ORDER — IPRATROPIUM BROMIDE 0.02 % IN SOLN
0.5000 mg | Freq: Four times a day (QID) | RESPIRATORY_TRACT | Status: DC
  Administered 2019-05-10 (×2): 0.5 mg via RESPIRATORY_TRACT
  Filled 2019-05-10 (×2): qty 2.5

## 2019-05-10 MED ORDER — ALTEPLASE 2 MG IJ SOLR: 2.0000 mg | Freq: Once | INTRAMUSCULAR | Status: DC | PRN

## 2019-05-10 MED ORDER — METOPROLOL SUCCINATE ER 50 MG PO TB24
50.0000 mg | ORAL_TABLET | Freq: Every day | ORAL | Status: DC
  Administered 2019-05-10 – 2019-05-11 (×2): 50 mg via ORAL
  Filled 2019-05-10 (×2): qty 1

## 2019-05-10 MED ORDER — LEVALBUTEROL HCL 0.63 MG/3ML IN NEBU
0.6300 mg | INHALATION_SOLUTION | Freq: Two times a day (BID) | RESPIRATORY_TRACT | Status: DC
  Administered 2019-05-11: 0.63 mg via RESPIRATORY_TRACT
  Filled 2019-05-10 (×2): qty 3

## 2019-05-10 MED ORDER — LEVALBUTEROL HCL 0.63 MG/3ML IN NEBU
0.6300 mg | INHALATION_SOLUTION | Freq: Four times a day (QID) | RESPIRATORY_TRACT | Status: DC
  Administered 2019-05-10 (×2): 0.63 mg via RESPIRATORY_TRACT
  Filled 2019-05-10 (×2): qty 3

## 2019-05-10 MED ORDER — METOPROLOL TARTRATE 5 MG/5ML IV SOLN
5.0000 mg | Freq: Once | INTRAVENOUS | Status: AC
  Administered 2019-05-10: 5 mg via INTRAVENOUS

## 2019-05-10 MED ORDER — PENTAFLUOROPROP-TETRAFLUOROETH EX AERO: 1.0000 "application " | INHALATION_SPRAY | CUTANEOUS | Status: DC | PRN

## 2019-05-10 MED ORDER — LIDOCAINE-PRILOCAINE 2.5-2.5 % EX CREA
1.0000 "application " | TOPICAL_CREAM | CUTANEOUS | Status: DC | PRN
  Filled 2019-05-10: qty 5

## 2019-05-10 MED ORDER — DILTIAZEM HCL-DEXTROSE 100-5 MG/100ML-% IV SOLN (PREMIX)
5.0000 mg/h | INTRAVENOUS | Status: DC
  Administered 2019-05-10: 5 mg/h via INTRAVENOUS
  Filled 2019-05-10: qty 100

## 2019-05-10 MED ORDER — HEPARIN (PORCINE) 25000 UT/250ML-% IV SOLN
800.0000 [IU]/h | INTRAVENOUS | Status: DC
  Administered 2019-05-10: 800 [IU]/h via INTRAVENOUS
  Filled 2019-05-10: qty 250

## 2019-05-10 MED ORDER — LIDOCAINE HCL (PF) 1 % IJ SOLN: 5.0000 mL | INTRAMUSCULAR | Status: DC | PRN

## 2019-05-10 MED ORDER — HEPARIN SODIUM (PORCINE) 1000 UNIT/ML DIALYSIS
1000.0000 [IU] | INTRAMUSCULAR | Status: DC | PRN
  Filled 2019-05-10: qty 1

## 2019-05-10 MED ORDER — METOPROLOL TARTRATE 5 MG/5ML IV SOLN
INTRAVENOUS | Status: AC
  Filled 2019-05-10: qty 5

## 2019-05-10 NOTE — Progress Notes (Signed)
ANTICOAGULATION CONSULT NOTE - Follow Up Consult  Pharmacy Consult for IV heparin Indication: atrial fibrillation  Allergies  Allergen Reactions  . Citalopram Hydrobromide Hives  . Morphine And Related Itching    Patient Measurements: Height: 5\' 4"  (162.6 cm) Weight: 152 lb 8.9 oz (69.2 kg) IBW/kg (Calculated) : 54.7 Heparin Dosing Weight: 68.4 kg  Vital Signs: Temp: 98.2 F (36.8 C) (06/24 1010) Temp Source: Oral (06/24 1010) BP: 117/74 (06/24 1440) Pulse Rate: 151 (06/24 1440)  Labs: Recent Labs    05/08/19 0906  05/09/19 0455 05/09/19 0940 05/09/19 1426 05/09/19 1904 05/10/19 0610  HGB 10.8*  --  10.3*  --   --   --  9.0*  HCT 34.9*  --  33.0*  --   --   --  28.5*  PLT 172  --  172  --   --   --  152  CREATININE 8.69*  --  8.99*  --   --   --  10.23*  TROPONINI  --    < > 0.17* 0.18* 0.14* 0.14*  --    < > = values in this interval not displayed.    Estimated Creatinine Clearance: 5 mL/min (A) (by C-G formula based on SCr of 10.23 mg/dL (H)).   Medications:  Infusions:  . sodium chloride    . sodium chloride    . cefTRIAXone (ROCEPHIN)  IV Stopped (05/09/19 1107)  . diltiazem (CARDIZEM) infusion    . heparin      Assessment: 68 yo female with new-onset afib, pharmacy asked to begin anticoagulation with IV heparin.  Baseline Hgb a little low, Pltc okay.  No overt bleeding or complications noted.  Received SQ heparin today at 0645 AM today.  Goal of Therapy:  Heparin level 0.3-0.7 units/ml Monitor platelets by anticoagulation protocol: Yes   Plan:  1. Start IV heparin at rate of 800 units/hr. 2. Check heparin level 8 hrs after gtt starts. 3. Daily heparin level and CBC. 4. F/u plans for oral anticoagulation as needed.  Marguerite Olea, [facility] Clinical Pharmacist Phone [phone]  05/10/2019 3:24 PM

## 2019-05-10 NOTE — Progress Notes (Signed)
PT Cancellation Note  Patient Details Name: Kari Robinson MRN: [id number] DOB: [birth year]   Cancelled Treatment:    Reason Eval/Treat Not Completed: Patient at procedure or test/unavailable attempted to eval patient, off unit at this time. Will re attempt as time permits.   Reinaldo Berber, PT, DPT Acute Rehabilitation Services Pager: [phone] Office: [phone]    Reinaldo Berber 05/10/2019, 12:39 PM

## 2019-05-10 NOTE — Progress Notes (Signed)
[facility] Progress Note  Subjective: seen on HD, doing well, no further SOB. Wears 2L O2 at home.   Vitals:   05/10/19 1130 05/10/19 1200 05/10/19 1230 05/10/19 1300  BP: (!) 144/68 119/65 107/60   Pulse: 96 98 99 (!) 104  Resp: 20 20 18 17   Temp:      TempSrc:      SpO2: 98% 100% 99% 100%  Weight:      Height:        Inpatient medications: . albuterol  2.5 mg Nebulization QID  . arformoterol  15 mcg Nebulization BID  . aspirin EC  81 mg Oral Daily  . atorvastatin  40 mg Oral Daily  . budesonide (PULMICORT) nebulizer solution  0.5 mg Nebulization BID  . calcitRIOL  1.25 mcg Oral Q M,W,F-HD  . Chlorhexidine Gluconate Cloth  6 each Topical Q0600  . cinacalcet  30 mg Oral Q M,W,F-1800  . clopidogrel  75 mg Oral Daily  . ferric citrate  210 mg Oral TID WC  . guaiFENesin  600 mg Oral BID  . heparin  5,000 Units Subcutaneous Q8H  . isosorbide mononitrate  30 mg Oral QHS  . lidocaine-prilocaine  1 application Topical Q M,W,F  . predniSONE  40 mg Oral Q breakfast  . QUEtiapine  200 mg Oral QHS  . sodium chloride flush  3 mL Intravenous Q12H   . sodium chloride    . sodium chloride    . cefTRIAXone (ROCEPHIN)  IV Stopped (05/09/19 1107)   sodium chloride, sodium chloride, acetaminophen **OR** acetaminophen, alteplase, heparin, hydrALAZINE, lidocaine (PF), lidocaine-prilocaine, ondansetron **OR** ondansetron (ZOFRAN) IV, pentafluoroprop-tetrafluoroeth    Exam: General: WNWD female on nasal oxygen NAD  Head: NCAT sclera not icteric MMM Neck: Supple. No JVD No masses Lungs: CTA bilaterally without wheezes, rales, or rhonchi.  Heart: RRR with S1 S2 Abdomen: soft NT + BS Ext: no edema Neuro: A & O  X 3. Moves all extremities spontaneously. Psych:  Responds to questions appropriately with a normal affect. Dialysis Access: R forearm AVF +bruit    Dialysis:  [facility] M[facility]  3.5h 350/800 EDW 66.5kg 2K/2Ca R AVF 16g needles No heparin -Venofer 100mg  IV x 10 (until  6/24) -Calcitriol 1.25 mcg PO TIW  -Sensipar 30 PO TIW   Assessment/Plan: 1. Dyspnea/Probable COPD exacerbation - CXR with emphysema, no edema. Pt has improved w/ Rx for COPD exac.   2. Tachycardia - at the end of HD today. Possible SVT, mild symptoms. Ordered stat EKG and have d/w primary MD 3. ESRD -  MWF HD.  HD today 4. Hypertension/volume  - BP high on admission. Better now. Cont meds 5. Anemia  - No ESA needs currently. Continue IV Fe here.  6. Metabolic bone disease -  Ca ok. Continue Calcitriol/Sensipar     [facility] 05/10/2019, 1:46 PM  Iron/TIBC/Ferritin/ %Sat    Component Value Date/Time   IRON 42 09/08/2018 1324   TIBC 318 09/08/2018 1324   FERRITIN 39 09/08/2018 1324   IRONPCTSAT 13 09/08/2018 1324   Recent Labs  Lab 05/10/19 0610  NA 142  K 3.9  CL 108  CO2 18*  GLUCOSE 120*  BUN 106*  CREATININE 10.23*  CALCIUM 9.0  PHOS 7.3*  ALBUMIN 3.2*   No results for input(s): AST, ALT, ALKPHOS, BILITOT, PROT in the last 168 hours. Recent Labs  Lab 05/10/19 0610  WBC 7.5  HGB 9.0*  HCT 28.5*  PLT 152

## 2019-05-10 NOTE — Progress Notes (Signed)
Resumed patient care at 11pm, patient alert and oriented x4.  Denies pain or SOB with assess.  Patient on 2 liters for exertion with activity.  She denies sob when resting. Dialysis report provided, she is currently pending dialysis.  Stable condition, will continue to monitor.

## 2019-05-10 NOTE — Evaluation (Addendum)
Physical Therapy Evaluation Patient Details Name: Niti Leisure MRN: [id number] DOB: 21-Aug-1951 Today's Date: 05/10/2019   History of Present Illness  Pt is a 68 yo female s/p Biapical emphysema, tachypneic, tachycardia and acute respiratory failure with elevaetd troponin. PMHx: COPD, HTN, HLD, depression, ESRD on MWF.    Clinical Impression  Pt admitted with above diagnosis. Pt currently with functional limitations due to the deficits listed below (see PT Problem List). PTA, pt living with boyfriend, ambulating intermittent use of rollator. Today patient just back from HD, fatigued, weak BLE, standing EOB without assistance and side stepping. Pt states her SO is with her all the time and will guard her for OOB mobility. Pt will benefit from skilled PT to increase their independence and safety with mobility to allow discharge to the venue listed below.       Follow Up Recommendations CIR (may be able to progress to HHPT)    Equipment Recommendations  None recommended by PT    Recommendations for Other Services       Precautions / Restrictions Precautions Precautions: Fall;Other (comment) Precaution Comments: watch O2 on 2L  Restrictions Weight Bearing Restrictions: No      Mobility  Bed Mobility Overal bed mobility: Needs Assistance Bed Mobility: Sidelying to Sit;Sit to Supine   Sidelying to sit: Min guard   Sit to supine: Min guard   General bed mobility comments: minguardA and use of rail  Transfers Overall transfer level: Needs assistance Equipment used: Rolling walker (2 wheeled) Transfers: Sit to/from [facility] Sit to Stand: Min guard Stand pivot transfers: Min guard       General transfer comment: min guard, side stepped along bed several times.   Ambulation/Gait                Stairs            Wheelchair Mobility    Modified Rankin (Stroke Patients Only)       Balance Overall balance assessment: Needs  assistance Sitting-balance support: Bilateral upper extremity supported Sitting balance-Leahy Scale: Fair     Standing balance support: Bilateral upper extremity supported Standing balance-Leahy Scale: Fair                               Pertinent Vitals/Pain Faces Pain Scale: Hurts little more Pain Location: back    Home Living Family/patient expects to be discharged to:: Private residence Living Arrangements: Spouse/significant other Available Help at Discharge: Family;Available 24 hours/day Type of Home: Apartment Home Access: Level entry     Home Layout: One level Home Equipment: Clinical cytogeneticist - 4 wheels;Grab bars - tub/shower;Toilet riser      Prior Function Level of Independence: Needs assistance   Gait / Transfers Assistance Needed: with rollator, multiple falls in the past month           Hand Dominance   Dominant Hand: Right    Extremity/Trunk Assessment        Lower Extremity Assessment RLE Deficits / Details: shaking from weakness LLE Deficits / Details: shaking from weakness    Cervical / Trunk Assessment Cervical / Trunk Assessment: Normal  Communication   Communication: No difficulties  Cognition Arousal/Alertness: Awake/alert Behavior During Therapy: WFL for tasks assessed/performed Overall Cognitive Status: Within Functional Limits for tasks assessed  General Comments: could benefit from further testing      General Comments      Exercises     Assessment/Plan    PT Assessment Patient needs continued PT services  PT Problem List Decreased strength       PT Treatment Interventions DME instruction;Gait training;Stair training;Functional mobility training;Therapeutic activities;Therapeutic exercise    PT Goals (Current goals can be found in the Care Plan section)  Acute Rehab PT Goals Patient Stated Goal: to get my legs stronger PT Goal Formulation: With  patient Potential to Achieve Goals: Good    Frequency Min 3X/week   Barriers to discharge        Co-evaluation               AM-PAC PT "6 Clicks" Mobility  Outcome Measure Help needed turning from your back to your side while in a flat bed without using bedrails?: A Little Help needed moving from lying on your back to sitting on the side of a flat bed without using bedrails?: A Little Help needed moving to and from a bed to a chair (including a wheelchair)?: A Little Help needed standing up from a chair using your arms (e.g., wheelchair or bedside chair)?: A Little Help needed to walk in hospital room?: A Little Help needed climbing 3-5 steps with a railing? : A Little 6 Click Score: 18    End of Session Equipment Utilized During Treatment: Gait belt Activity Tolerance: Patient tolerated treatment well Patient left: in bed;with call bell/phone within reach Nurse Communication: Mobility status PT Visit Diagnosis: Unsteadiness on feet (R26.81)    Time: 1733-1800 PT Time Calculation (min) (ACUTE ONLY): 27 min   Charges:   PT Evaluation $PT Eval Moderate Complexity: 1 Mod PT Treatments $Therapeutic Activity: 8-22 mins       Reinaldo Berber, PT, DPT Acute Rehabilitation Services Pager: [phone] Office: [place] 05/10/2019, 6:04 PM

## 2019-05-10 NOTE — Progress Notes (Signed)
Pt would like home RX for nebs, states they are highly effective.

## 2019-05-10 NOTE — Progress Notes (Signed)
PROGRESS NOTE    Kari Robinson  SWF:[id number] DOB: 1951/05/08 DOA: 05/09/2019 PCP: Tsosie Billing, MD  Outpatient Specialists:   Brief Narrative:  As per H and P "Kari Robinson is a 68 y.o. female with medical history significant of ESRD on HD (MWF),hypertension, hyperlipidemia, COPD on 2 L oxygen as needed, CHF with EF of 40%, depression, tobacco abuse, alcohol abuse in remission, CAD, and carotid artery stenosis; who presented with complaints of progressively worsening shortness of breath over the last 3 days.  She was seen in the emergency department yesterday for the same yesterday, but was able to be discharged home.  Says she was here in the emergency department she missed her regularly scheduled hemodialysis session.  She complains of having a nonproductive cough that is worse than usual.  Tried utilizing home inhalers without any significant relief of symptoms.  Normally she only uses oxygen as needed, but had been requiring it 24/7 over the last few days.  Denies having any significant fever, chest pain, loss of consciousness, nausea, or vomiting.  Patient reports smoking 4 packs of cigarettes per day for approximately 68 years, but quit 1 month ago cold [place].  Other associated symptoms include back pain with bilateral leg pain.  Previously, evaluated in May by after MRI revealed L4-L5 lumbar stenosis by Dr. Lelon Perla neurosurgery, but she declined any surgical procedure at that time.  She now states that symptoms are so severe that she she would like to be seen by neurosurgery during this hospitalization.  ED Course: Upon admission into the emergency department patient was noted to be afebrile, pulse 99-1 11, respirations 12-27, blood pressures 135/94 -171/125, and O2 saturations 95-99% on 3.5 L nasal cannula oxygen.  Labs revealed WBC 10.8, hemoglobin 10.3, potassium 4.1, CO2 15, BUN 86, creatinine 8.99, anion gap 17, BNP 3871.3, and troponin 0.017.  Chest x-ray showed  emphysematous changes of the lung without any other acute abnormality noted.  Patient was ordered 125 mg of Solu-Medrol IV, 40 mg of Lasix IV, and showed nitroglycerin ointment, and 10 puffs of albuterol inhaler".  05/10/2019: Patient seen on hemodialysis.  Shortness of breath has improved significantly.  However, patient underwent into A. fib with rapid ventricular response during hemodialysis.  Will transfer patient back to cardiac telemetry.  Start patient on Cardizem and heparin.  Low threshold to consult cardiology team.  Last echocardiogram noted.  Further management will depend on hospital course.   Assessment & Plan:   Principal Problem:   COPD exacerbation ([place]) Active Problems:   Essential hypertension   Coronary atherosclerosis   Acute CHF (congestive heart failure) ([name])   Anemia of chronic disease   ESRD on dialysis ([place])   Tobacco abuse   Leukocytosis   Lumbar stenosis  Acute on chronic respiratory failure with hypoxia/COPD exacerbation:  Acute on chronic.  Patient presents with progressively worsening shortness of breath.  Normally only on 2 L of nasal cannula oxygen as needed.  Chest x-ray showing postoperative chest with emphysema unchanged from previous day.  -Admit to a telemetry bed -Focused COPD order set initiated -Budesonide and Brovana nebs twice daily -Albuterol nebs 4 times daily and as needed -Empiric antibiotics of Rocephin IV -Prednisone 40 mg daily starting 6/24 -Mucinex -PT/OT to evaluate and treat 05/10/2019: Optimize COPD treatment.  Discontinue albuterol.  Will start Xopenex due to atrial fibrillation.  ESRD on HD:  Patient normally dialyzes Monday, Wednesday, Friday.  Reports missing hemodialysis session yesterday.  Potassium 4.1, BUN 86, and creatinine  8.99.  Patient still reports making some urine. -Dr. Jonnie Finner of nephrology consulted, will follow for any further recommendation 05/10/2019: Patient was seen on hemodialysis.  Patient went into A. fib  with RVR during hemodialysis.  Atrial fibrillation with rapid ventricular response: Transfer patient to cardiac telemetry Cardizem drip Heparin drip Low threshold to consult cardiology Troponin as per protocol  Elevated troponin, coronary artery disease: Acute on chronic.   On admission troponin elevated up to 0.17.  Patient troponin appears chronically elevated.  She currently denies having any chest pain symptoms.  Suspect secondary to acute respiratory distress. -Trend cardiac troponin -Continue aspirin, Plavix, statin, and isosorbide mononitrate 05/10/2019: With cycle cardiac enzymes.  Patient may have been going in and out of A. fib with RVR prior to presentation without anyone knowing.  Systolic congestive heart failure: Last EF noted to be 40 to 45% by echocardiogram on 02/25/2019.  On admission BNP elevated at 3871.3.  No frank edema noted on the chest x-ray, but suspect some aspect of fluid overload. -Strict intake and output  L4-L5 lumbar stenosis and sciatica:  Patient reports having severe pain in her lower legs.  Previous MRI noting lumbar stenosis for which she was evaluated by neurosurgery during her May hospitalization. -Reconsult [place] of neurosurgery once patient acute symptoms improved 05/10/2019: No back pain reported.  Essential hypertension:  Continue to optimize.    Hyperlipidemia -Continue atorvastatin  Anemia of chronic disease/CKD:  Hemoglobin today is 9 g/dL.  History of Lung cancer:  Patient is status post left lobectomy  Tobacco abuse:  Patient reports smoking 4 packs of cigarettes per day on average for least 50 years, but just quit 1 month ago. -Encourage continued cessation of tobacco use    DVT prophylaxis: Heparin drip Code Status: Full code Family Communication:  Disposition Plan: Eventually   Consultants:   Nephrology.  Procedures:   Hemodialysis.  Antimicrobials:   None.   Subjective: No shortness of breath  No chest pain No fever or chills  Objective: Vitals:   05/10/19 1400 05/10/19 1415 05/10/19 1427 05/10/19 1440  BP: (!) 116/58 123/68 116/68 117/74  Pulse: (!) 148 (!) 161 (!) 153 (!) 151  Resp: 20 18 20  (!) 24  Temp:      TempSrc:      SpO2:      Weight:      Height:        Intake/Output Summary (Last 24 hours) at 05/10/2019 1452 Last data filed at 05/10/2019 0600 Gross per 24 hour  Intake -  Output 200 ml  Net -200 ml   Filed Weights   05/09/19 1415 05/10/19 1010  Weight: 68.5 kg 69.2 kg    Examination:  General exam: Appears calm and comfortable  Respiratory system: Decreased air entry globally Cardiovascular system: S1 & S2 Gastrointestinal system: Abdomen is nondistended, soft and nontender. No organomegaly or masses felt. Normal bowel sounds heard. Central nervous system: Alert and oriented. Patient moves all extremities. Extremities: No leg edema  Data Reviewed: I have personally reviewed following labs and imaging studies  CBC: Recent Labs  Lab 05/08/19 0906 05/09/19 0455 05/10/19 0610  WBC 10.8* 10.8* 7.5  NEUTROABS 8.9* 8.7*  --   HGB 10.8* 10.3* 9.0*  HCT 34.9* 33.0* 28.5*  MCV 103.6* 103.4* 101.8*  PLT 172 172 595   Basic Metabolic Panel: Recent Labs  Lab 05/08/19 0906 05/09/19 0455 05/10/19 0610  NA 140 140 142  K 4.1 4.1 3.9  CL 105 108 108  CO2 18*  15* 18*  GLUCOSE 103* 112* 120*  BUN 76* 86* 106*  CREATININE 8.69* 8.99* 10.23*  CALCIUM 9.5 9.1 9.0  PHOS  --   --  7.3*   GFR: Estimated Creatinine Clearance: 5 mL/min (A) (by C-G formula based on SCr of 10.23 mg/dL (H)). Liver Function Tests: Recent Labs  Lab 05/10/19 0610  ALBUMIN 3.2*   No results for input(s): LIPASE, AMYLASE in the last 168 hours. No results for input(s): AMMONIA in the last 168 hours. Coagulation Profile: No results for input(s): INR, PROTIME in the last 168 hours. Cardiac Enzymes: Recent Labs  Lab 05/09/19 0455 05/09/19 0940 05/09/19 1426 05/09/19  1904  TROPONINI 0.17* 0.18* 0.14* 0.14*   BNP (last 3 results) No results for input(s): PROBNP in the last 8760 hours. HbA1C: No results for input(s): HGBA1C in the last 72 hours. CBG: Recent Labs  Lab 05/09/19 1638 05/10/19 0634  GLUCAP 123* 109*   Lipid Profile: No results for input(s): CHOL, HDL, LDLCALC, TRIG, CHOLHDL, LDLDIRECT in the last 72 hours. Thyroid Function Tests: No results for input(s): TSH, T4TOTAL, FREET4, T3FREE, THYROIDAB in the last 72 hours. Anemia Panel: No results for input(s): VITAMINB12, FOLATE, FERRITIN, TIBC, IRON, RETICCTPCT in the last 72 hours. Urine analysis:    Component Value Date/Time   COLORURINE STRAW (A) 02/25/2019 0735   APPEARANCEUR CLEAR 02/25/2019 0735   LABSPEC 1.004 (L) 02/25/2019 0735   PHURINE 9.0 (H) 02/25/2019 0735   GLUCOSEU NEGATIVE 02/25/2019 0735   HGBUR SMALL (A) 02/25/2019 0735   BILIRUBINUR NEGATIVE 02/25/2019 0735   KETONESUR NEGATIVE 02/25/2019 0735   PROTEINUR 100 (A) 02/25/2019 0735   NITRITE NEGATIVE 02/25/2019 0735   LEUKOCYTESUR NEGATIVE 02/25/2019 0735   Sepsis Labs: @LABRCNTIP (procalcitonin:4,lacticidven:4)  ) Recent Results (from the past 240 hour(s))  SARS Coronavirus 2 (CEPHEID- Performed in [facility] hospital lab), Hosp Order     Status: None   Collection Time: 05/09/19  6:22 AM   Specimen: Nasopharyngeal Swab  Result Value Ref Range Status   SARS Coronavirus 2 NEGATIVE NEGATIVE Final    Comment: (NOTE) If result is NEGATIVE SARS-CoV-2 target nucleic acids are NOT DETECTED. The SARS-CoV-2 RNA is generally detectable in upper and lower  respiratory specimens during the acute phase of infection. The lowest  concentration of SARS-CoV-2 viral copies this assay can detect is 250  copies / mL. A negative result does not preclude SARS-CoV-2 infection  and should not be used as the sole basis for treatment or other  patient management decisions.  A negative result may occur with  improper specimen  collection / handling, submission of specimen other  than nasopharyngeal swab, presence of viral mutation(s) within the  areas targeted by this assay, and inadequate number of viral copies  (<250 copies / mL). A negative result must be combined with clinical  observations, patient history, and epidemiological information. If result is POSITIVE SARS-CoV-2 target nucleic acids are DETECTED. The SARS-CoV-2 RNA is generally detectable in upper and lower  respiratory specimens dur ing the acute phase of infection.  Positive  results are indicative of active infection with SARS-CoV-2.  Clinical  correlation with patient history and other diagnostic information is  necessary to determine patient infection status.  Positive results do  not rule out bacterial infection or co-infection with other viruses. If result is PRESUMPTIVE POSTIVE SARS-CoV-2 nucleic acids MAY BE PRESENT.   A presumptive positive result was obtained on the submitted specimen  and confirmed on repeat testing.  While 2019 novel coronavirus  (  SARS-CoV-2) nucleic acids may be present in the submitted sample  additional confirmatory testing may be necessary for epidemiological  and / or clinical management purposes  to differentiate between  SARS-CoV-2 and other Sarbecovirus currently known to infect humans.  If clinically indicated additional testing with an alternate test  methodology [phone]) is advised. The SARS-CoV-2 RNA is generally  detectable in upper and lower respiratory sp ecimens during the acute  phase of infection. The expected result is Negative. Fact Sheet for Patients:  StrictlyIdeas.no Fact Sheet for Healthcare Providers: BankingDealers.co.za This test is not yet approved or cleared by the [place] FDA and has been authorized for detection and/or diagnosis of SARS-CoV-2 by FDA under an Emergency Use Authorization (EUA).  This EUA will remain in effect  (meaning this test can be used) for the duration of the COVID-19 declaration under Section 564(b)(1) of the Act, 21 U.S.C. section 360bbb-3(b)(1), unless the authorization is terminated or revoked sooner. Performed at [facility], [place] [address] [place], [place] [address]          Radiology Studies: Dg Chest Portable 1 View  Result Date: 05/09/2019 CLINICAL DATA:  Worsening shortness of breath EXAM: PORTABLE CHEST 1 VIEW COMPARISON:  05/08/2019 FINDINGS: Normal heart size. Aortic tortuosity. There has been coronary stenting. Postoperative left lung with volume loss. Biapical emphysema. There is no edema, consolidation, effusion, or pneumothorax. IMPRESSION: Postoperative chest with emphysema. No acute finding when compared to prior. Electronically Signed   By: Monte Fantasia M.D.   On: 05/09/2019 05:25        Scheduled Meds: . arformoterol  15 mcg Nebulization BID  . aspirin EC  81 mg Oral Daily  . atorvastatin  40 mg Oral Daily  . budesonide (PULMICORT) nebulizer solution  0.5 mg Nebulization BID  . calcitRIOL  1.25 mcg Oral Q M,W,F-HD  . Chlorhexidine Gluconate Cloth  6 each Topical Q0600  . cinacalcet  30 mg Oral Q M,W,F-1800  . clopidogrel  75 mg Oral Daily  . ferric citrate  210 mg Oral TID WC  . guaiFENesin  600 mg Oral BID  . ipratropium  0.5 mg Nebulization Q6H  . isosorbide mononitrate  30 mg Oral QHS  . levalbuterol  0.63 mg Nebulization Q6H  . lidocaine-prilocaine  1 application Topical Q M,W,F  . metoprolol tartrate      . predniSONE  40 mg Oral Q breakfast  . QUEtiapine  200 mg Oral QHS  . sodium chloride flush  3 mL Intravenous Q12H   Continuous Infusions: . sodium chloride    . sodium chloride    . cefTRIAXone (ROCEPHIN)  IV Stopped (05/09/19 1107)  . diltiazem (CARDIZEM) infusion    . heparin       LOS: 0 days    Time spent: 35 Minutes.    Dana Allan, MD  [facility] Pager #: [phone] 7PM-7AM contact night coverage  as above

## 2019-05-10 NOTE — Consult Note (Signed)
Cardiology Consultation:   Patient ID: Kari Robinson; [id number]; 20-Jun-1951   Admit date: 05/09/2019 Date of Consult: 05/10/2019  Primary Care Provider: Tsosie Billing, MD Primary Cardiologist: [phone] Dr. Louie Casa  Primary Electrophysiologist:  None   Patient Profile:   Kari Robinson is a 68 y.o. female with a hx of CAD and ischemic cardiomyopathy who is being seen today for the evaluation of atrial fib at the request of Dr. Marthenia Rolling.  History of Present Illness:   Kari Robinson has a history of CAD with prior MI receiving stents in [place] in 2005 and 2016. Notes report she received 3 stents in 2005 and her NSTEMI in 2016 was medically managed. Of note LVEF 25-30% by echo in 09/2017 however by repeat study 04/2018 had normalized. Echo in April demonstrates an EF of 40 - 45% with moderate concentric hypertrophy.  This was stable compared to 2019.  She last seen by [place] in April of this year.  Dr. Harl Robinson saw her in consultation.     She had an episode of unresponsiveness that was called a cardiac arrest but the clinical scenario did not fit this.  It was ultimately thought that this might be a seizure and non cardiac.  She had a baseline abnormal trop with a flat trend.    She has O2 dependent COPD. She presented with SOB.  She had missed a dialysis session.    In the ED she was treated with Solu-medrol for probable COPD exacerbation.  She has had slightly elevated troponin and fluctuating BP and was treated with NTG paste.   She had dialysis with improvement in her symptoms but developed atrial fib with dialysis.   She was being treated with albuterol.   She was started on heparin and treated with a Cardizem drip.  She was in this rhythm for 3 hours and converted spontaneously.  She says that when it happened she felt light headed and nervous but she did not report chest pressure.  She thinks she does this on dialysis but I don't see that it has been documented.  She has not had any  recent syncope or presyncope.  She might have been told about being in fib in the past.   I don't see any mention of atrial fib in Care Everywhere.    She says that she usually does OK and doesn't have angina.  She does not report PND or orthopnea.  She does wear O2.    She was last admitted to this hospital in May of this year.  She had leg weakness and spinal stenosis and she opted for no interventional therapy.      Past Medical History:  Diagnosis Date   Acute on chronic heart failure ([name])    Acute respiratory failure with hypoxia ([name])    Alcohol abuse    Arthralgia    CAD (coronary artery disease)    moderate primarily septal perforater   CHF (congestive heart failure) ([name])    COPD (chronic obstructive pulmonary disease) ([name])    Depression    HTN (hypertension)    Hypercholesterolemia    primarily ldl-p and small particles   RAS (renal artery stenosis) ([place]) 2002   by cath tysinger   Renal disorder    Smoking     Past Surgical History:  Procedure Laterality Date   abdominal aortogram     perclose of the right femoral artery   AV FISTULA PLACEMENT Right 07/11/2018   Procedure: Right arm ARTERIOVENOUS FISTULA  CREATION;  Surgeon: [name], MD;  Location: [facility] OR;  Service: Vascular;  Laterality: Right;   AVF placement Right    CARDIAC CATHETERIZATION     left heart catheterization.  Coronary cineangiography. Lft ventricular cineangiography.     LUNG SURGERY     due to lung cancer per patient's report   TUBAL LIGATION       Home Medications:  Prior to Admission medications   Medication Sig Start Date End Date Taking? Authorizing Provider  albuterol (PROVENTIL HFA;VENTOLIN HFA) 108 (90 Base) MCG/ACT inhaler Inhale 2 puffs into the lungs every 4 (four) hours as needed for wheezing.  11/25/17  Yes [provider]  albuterol (PROVENTIL) (2.5 MG/3ML) 0.083% nebulizer solution Take 2.5 mg by nebulization every 6 (six) hours as needed for  wheezing or shortness of breath.  07/28/18  Yes [provider]  aspirin EC 81 MG tablet Take 81 mg by mouth daily.   Yes [provider]  atorvastatin (LIPITOR) 40 MG tablet Take 40 mg by mouth daily.   Yes [provider]  b complex-vitamin c-folic acid (NEPHRO-VITE) 0.8 MG TABS tablet Take 1 tablet by mouth See admin instructions. Take one tablet by mouth on Sunday, Tuesday, Thursday, Saturday mornings. Take one tablet after dialysis on Monday, Wednesday, Friday 02/06/19  Yes [provider]  budesonide-formoterol (SYMBICORT) 160-4.5 MCG/ACT inhaler Inhale 2 puffs into the lungs 2 (two) times daily.   Yes [provider]  clopidogrel (PLAVIX) 75 MG tablet Take 75 mg by mouth daily.   Yes [provider]  diclofenac sodium (VOLTAREN) 1 % GEL Apply 2 g topically 4 (four) times daily. 02/27/19  Yes Hedges, Dellis Filbert, PA-C  isosorbide mononitrate (IMDUR) 30 MG 24 hr tablet Take 30 mg by mouth at bedtime.    Yes [provider]  OXYGEN Inhale 2 L into the lungs continuous.   Yes [provider]  SEROQUEL 200 MG tablet Take 200 mg by mouth at bedtime. 04/05/19  Yes [provider]  AURYXIA 1 GM 210 MG(Fe) tablet Take 1 tablet by mouth 3 (three) times daily with meals. 04/06/19   [provider]  hydrALAZINE (APRESOLINE) 50 MG tablet Take 1 tablet (50 mg total) by mouth 3 (three) times daily for 30 days. Patient not taking: Reported on 05/09/2019 04/02/19 05/02/19  Cherylann Ratel A, DO  lidocaine-prilocaine (EMLA) cream Apply 1 application topically every Monday, Wednesday, and Friday. Prior to dialysis 04/06/19   [provider]    Inpatient Medications: Scheduled Meds:  arformoterol  15 mcg Nebulization BID   aspirin EC  81 mg Oral Daily   atorvastatin  40 mg Oral Daily   budesonide (PULMICORT) nebulizer solution  0.5 mg Nebulization BID   calcitRIOL  1.25 mcg Oral Q M,W,F-HD   Chlorhexidine Gluconate  Cloth  6 each Topical Q0600   cinacalcet  30 mg Oral Q M,W,F-1800   clopidogrel  75 mg Oral Daily   ferric citrate  210 mg Oral TID WC   guaiFENesin  600 mg Oral BID   ipratropium  0.5 mg Nebulization Q6H   isosorbide mononitrate  30 mg Oral QHS   levalbuterol  0.63 mg Nebulization Q6H   lidocaine-prilocaine  1 application Topical Q M,W,F   predniSONE  40 mg Oral Q breakfast   QUEtiapine  200 mg Oral QHS   sodium chloride flush  3 mL Intravenous Q12H   Continuous Infusions:  sodium chloride     sodium chloride  cefTRIAXone (ROCEPHIN)  IV 1 g (05/10/19 1559)   diltiazem (CARDIZEM) infusion 5 mg/hr (05/10/19 1520)   heparin     PRN Meds: sodium chloride, sodium chloride, acetaminophen **OR** acetaminophen, alteplase, heparin, hydrALAZINE, lidocaine (PF), lidocaine-prilocaine, ondansetron **OR** ondansetron (ZOFRAN) IV, pentafluoroprop-tetrafluoroeth  Allergies:    Allergies  Allergen Reactions   Citalopram Hydrobromide Hives   Morphine And Related Itching    Social History:   Social History   Socioeconomic History   Marital status: Single    Spouse name: Not on file   Number of children: Not on file   Years of education: Not on file   Highest education level: Not on file  Occupational History   Not on file  Social Needs   Financial resource strain: Not on file   Food insecurity    Worry: Not on file    Inability: Not on file   Transportation needs    Medical: Not on file    Non-medical: Not on file  Tobacco Use   Smoking status: Current Every Day Smoker    Years: 35.00    Types: Cigarettes   Smokeless tobacco: Never Used   Tobacco comment: 3-4 cigarettes per day  Substance and Sexual Activity   Alcohol use: Never    Frequency: Never    Comment: states has quit drinking "a few months ago"   Drug use: Never   Sexual activity: Not on file  Lifestyle   Physical activity    Days per week: Not on file    Minutes per session:  Not on file   Stress: Not on file  Relationships   Social connections    Talks on phone: Not on file    Gets together: Not on file    Attends religious service: Not on file    Active member of club or organization: Not on file    Attends meetings of clubs or organizations: Not on file    Relationship status: Not on file   Intimate partner violence    Fear of current or ex partner: Not on file    Emotionally abused: Not on file    Physically abused: Not on file    Forced sexual activity: Not on file  Other Topics Concern   Not on file  Social History Narrative   ** Merged History Encounter **        Family History:    Family History  Problem Relation Age of Onset   Emphysema Father      ROS:  Please see the history of present illness.  ROS  Severe back pain.  All other ROS reviewed and negative.     Physical Exam/Data:   Vitals:   05/10/19 1509 05/10/19 1513 05/10/19 1517 05/10/19 1631  BP:   134/88 (!) 117/58  Pulse:    95  Resp:    18  Temp:    98.7 F (37.1 C)  TempSrc:      SpO2: 99% 100% 100% 94%  Weight:      Height:        Intake/Output Summary (Last 24 hours) at 05/10/2019 1653 Last data filed at 05/10/2019 0600 Gross per 24 hour  Intake --  Output 200 ml  Net -200 ml   Filed Weights   05/09/19 1415 05/10/19 1010  Weight: 68.5 kg 69.2 kg   Body mass index is 26.19 kg/m.  GENERAL:  No acute distress.  Well appearing HEENT:   Pupils equal round and reactive, fundi not visualized, oral  mucosa unremarkable NECK:  No  jugular venous distention, waveform within normal limits, carotid upstroke brisk and symmetric, no bruits, no thyromegaly LYMPHATICS:  No cervical, inguinal adenopathy LUNGS:   Clear to auscultation bilaterally BACK:  No CVA tenderness CHEST:   Unremarkable HEART:  PMI not displaced or sustained,S1 and S2 within normal limits, no S3, no S4, no clicks, no rubs, no murmurs ABD:  Flat, positive bowel sounds normal in frequency in  pitch, no bruits, no rebound, no guarding, no midline pulsatile mass, no hepatomegaly, no splenomegaly EXT:  2 plus pulses throughout, no  edema, no cyanosis no clubbing SKIN:  No rashes no nodules.  Right forearm fistula NEURO:   Cranial nerves II through XII grossly intact, motor grossly intact throughout PSYCH:    Cognitively intact, oriented to person place and time   EKG:  The EKG was personally reviewed and demonstrates:  Atrial fib.  Rate 162, LVH ST T wave inversion consistent with ischemia.  QT prlonged  Telemetry:  Telemetry was personally reviewed and demonstrates:  NSR  Relevant CV Studies: NA  Laboratory Data:  Chemistry Recent Labs  Lab 05/08/19 0906 05/09/19 0455 05/10/19 0610  NA 140 140 142  K 4.1 4.1 3.9  CL 105 108 108  CO2 18* 15* 18*  GLUCOSE 103* 112* 120*  BUN 76* 86* 106*  CREATININE 8.69* 8.99* 10.23*  CALCIUM 9.5 9.1 9.0  GFRNONAA 4* 4* 3*  GFRAA 5* 5* 4*  ANIONGAP 17* 17* 16*    Recent Labs  Lab 05/10/19 0610  ALBUMIN 3.2*   Hematology Recent Labs  Lab 05/08/19 0906 05/09/19 0455 05/10/19 0610  WBC 10.8* 10.8* 7.5  RBC 3.37* 3.19* 2.80*  HGB 10.8* 10.3* 9.0*  HCT 34.9* 33.0* 28.5*  MCV 103.6* 103.4* 101.8*  MCH 32.0 32.3 32.1  MCHC 30.9 31.2 31.6  RDW 15.4 15.6* 15.9*  PLT 172 172 152   Cardiac Enzymes Recent Labs  Lab 05/09/19 0455 05/09/19 0940 05/09/19 1426 05/09/19 1904  TROPONINI 0.17* 0.18* 0.14* 0.14*   No results for input(s): TROPIPOC in the last 168 hours.  BNP Recent Labs  Lab 05/09/19 0455  BNP 3,871.3*    DDimer No results for input(s): DDIMER in the last 168 hours.  Radiology/Studies:  Dg Chest Portable 1 View  Result Date: 05/09/2019 CLINICAL DATA:  Worsening shortness of breath EXAM: PORTABLE CHEST 1 VIEW COMPARISON:  05/08/2019 FINDINGS: Normal heart size. Aortic tortuosity. There has been coronary stenting. Postoperative left lung with volume loss. Biapical emphysema. There is no edema,  consolidation, effusion, or pneumothorax. IMPRESSION: Postoperative chest with emphysema. No acute finding when compared to prior. Electronically Signed   By: Monte Fantasia M.D.   On: 05/09/2019 05:25   Dg Chest Port 1 View  Result Date: 05/08/2019 CLINICAL DATA:  Dyspnea. EXAM: PORTABLE CHEST 1 VIEW COMPARISON:  Radiograph of Mar 31, 2019. FINDINGS: Stable cardiomediastinal silhouette. Atherosclerosis of thoracic aorta is noted. No pneumothorax or pleural effusion is noted. No acute pulmonary disease is noted. Bony thorax is unremarkable. IMPRESSION: No acute cardiopulmonary abnormality seen. Aortic Atherosclerosis (ICD10-I70.0). Electronically Signed   By: Marijo Conception M.D.   On: 05/08/2019 09:21    Assessment and Plan:    ATRIAL FIB WITH RVR:  She had a limited episode of this.  I would not want to suggest warfarin (particularly with her history of leg weakness, fall, AMS) based on this one episode.  She will need an out patient event monitor to see if this was  an isolated event.  Can stop heparin and IV Cardizem.   It does look like she should tolerate the addition of a beta blocker.    AAA:  3.7 in .  Suggestion was follow up in two years.  I would defer follow up to her PCP and cardiologist.    CHRONIC SYSTOLIC AND DIASTOLIC HF:    Volume managed with dialysis.     CAD:   Troponin trend is not diagnostic of ACS in this situation with atrial fib and ESRD.  She has had abnormal troponin consistently in the past.  Continue medical management for this.  I agree with DAPT.      TOBACCO ABUSE:    She says she quit recently.     For questions or updates, please contact [place] Please consult [email or web address] for contact info under Cardiology/STEMI.   Signed, Minus Breeding, MD  05/10/2019 4:53 PM

## 2019-05-11 DIAGNOSIS — J441 Chronic obstructive pulmonary disease with (acute) exacerbation: Secondary | ICD-10-CM | POA: Diagnosis not present

## 2019-05-11 LAB — CBC
HCT: 31.5 % — ABNORMAL LOW (ref 36.0–46.0)
Hemoglobin: 10 g/dL — ABNORMAL LOW (ref 12.0–15.0)
MCH: 32.1 pg (ref 26.0–34.0)
MCHC: 31.7 g/dL (ref 30.0–36.0)
MCV: 101 fL — ABNORMAL HIGH (ref 80.0–100.0)
Platelets: 189 10*3/uL (ref 150–400)
RBC: 3.12 MIL/uL — ABNORMAL LOW (ref 3.87–5.11)
RDW: 16 % — ABNORMAL HIGH (ref 11.5–15.5)
WBC: 7.1 10*3/uL (ref 4.0–10.5)
nRBC: 0 % (ref 0.0–0.2)

## 2019-05-11 LAB — HEPARIN LEVEL (UNFRACTIONATED)
Heparin Unfractionated: 0.34 IU/mL (ref 0.30–0.70)
Heparin Unfractionated: 0.5 IU/mL (ref 0.30–0.70)

## 2019-05-11 MED ORDER — IPRATROPIUM-ALBUTEROL 0.5-2.5 (3) MG/3ML IN SOLN: 3.0000 mL | Freq: Four times a day (QID) | RESPIRATORY_TRACT | 0 refills | Status: DC | PRN

## 2019-05-11 MED ORDER — METOPROLOL SUCCINATE ER 50 MG PO TB24: 50.0000 mg | ORAL_TABLET | Freq: Every day | ORAL | 0 refills | Status: DC

## 2019-05-11 MED ORDER — ENOXAPARIN SODIUM 30 MG/0.3ML ~~LOC~~ SOLN
30.0000 mg | SUBCUTANEOUS | Status: DC
  Administered 2019-05-11: 30 mg via SUBCUTANEOUS
  Filled 2019-05-11: qty 0.3

## 2019-05-11 MED ORDER — CALCITRIOL 0.25 MCG PO CAPS: 1.2500 ug | ORAL_CAPSULE | ORAL | 0 refills | Status: AC

## 2019-05-11 MED ORDER — CINACALCET HCL 30 MG PO TABS: 30.0000 mg | ORAL_TABLET | ORAL | 0 refills | Status: DC

## 2019-05-11 MED ORDER — PREDNISONE 10 MG PO TABS: ORAL_TABLET | ORAL | 8 refills | Status: DC

## 2019-05-11 MED ORDER — IPRATROPIUM-ALBUTEROL 0.5-2.5 (3) MG/3ML IN SOLN: 3.0000 mL | Freq: Four times a day (QID) | RESPIRATORY_TRACT | Status: DC | PRN

## 2019-05-11 MED ORDER — SPIRIVA HANDIHALER 18 MCG IN CAPS: 18.0000 ug | ORAL_CAPSULE | Freq: Every day | RESPIRATORY_TRACT | 2 refills | Status: DC

## 2019-05-11 NOTE — Progress Notes (Signed)
SATURATION QUALIFICATIONS: (This note is used to comply with regulatory documentation for home oxygen)  Patient Saturations on Room Air at Rest = 85%  Patient Saturations on Room Air while Ambulating = 82%  Patient Saturations on 3 Liters of oxygen while Ambulating = 95%

## 2019-05-11 NOTE — Progress Notes (Signed)
[facility] Progress Note  Subjective: no further tachycardia, going home  Vitals:   05/11/19 0743 05/11/19 0746 05/11/19 0749 05/11/19 1211  BP:    124/90  Pulse:    76  Resp:    17  Temp:    97.8 F (36.6 C)  TempSrc:    Oral  SpO2: 99% 100% 100% 100%  Weight:      Height:        Inpatient medications: . arformoterol  15 mcg Nebulization BID  . aspirin EC  81 mg Oral Daily  . atorvastatin  40 mg Oral Daily  . budesonide (PULMICORT) nebulizer solution  0.5 mg Nebulization BID  . calcitRIOL  1.25 mcg Oral Q M,W,F-HD  . Chlorhexidine Gluconate Cloth  6 each Topical Q0600  . cinacalcet  30 mg Oral Q M,W,F-1800  . clopidogrel  75 mg Oral Daily  . enoxaparin (LOVENOX) injection  30 mg Subcutaneous Q24H  . ferric citrate  210 mg Oral TID WC  . guaiFENesin  600 mg Oral BID  . ipratropium  0.5 mg Nebulization BID  . isosorbide mononitrate  30 mg Oral QHS  . levalbuterol  0.63 mg Nebulization BID  . lidocaine-prilocaine  1 application Topical Q M,W,F  . metoprolol succinate  50 mg Oral Daily  . predniSONE  40 mg Oral Q breakfast  . QUEtiapine  200 mg Oral QHS  . sodium chloride flush  3 mL Intravenous Q12H   . sodium chloride    . sodium chloride    . cefTRIAXone (ROCEPHIN)  IV 1 g (05/11/19 0811)   sodium chloride, sodium chloride, acetaminophen **OR** acetaminophen, alteplase, hydrALAZINE, ipratropium-albuterol, lidocaine (PF), lidocaine-prilocaine, ondansetron **OR** ondansetron (ZOFRAN) IV, pentafluoroprop-tetrafluoroeth    Exam: General: WNWD female on nasal oxygen NAD  Head: NCAT sclera not icteric MMM Neck: Supple. No JVD No masses Lungs: CTA bilaterally without wheezes, rales, or rhonchi.  Heart: RRR with S1 S2 Abdomen: soft NT + BS Ext: no edema Neuro: A & O  X 3. Moves all extremities spontaneously. Psych:  Responds to questions appropriately with a normal affect. Dialysis Access: R forearm AVF +bruit    Dialysis:  [facility] M[facility]  3.5h 350/800  EDW 66.5kg 2K/2Ca R AVF 16g needles No heparin -Venofer 100mg  IV x 10 (until 6/24) -Calcitriol 1.25 mcg PO TIW  -Sensipar 30 PO TIW   Assessment/Plan: 1. Dyspnea/Probable COPD exacerbation - CXR with emphysema, no edema. Pt has improved w/ Rx for COPD exac.   2. ESRD -  MWF HD 3. Atrial fib - seen by cards, back in NSR.  Started BB, needs f/u, ok for dc 4. Hypertension/volume  - BP high on admission. Better now. Cont meds 5. Anemia  - No ESA needs currently. Continue IV Fe here.  6. Metabolic bone disease -  Ca ok. Continue Calcitriol/Sensipar  7. Dispo - for [employer] home today    [facility] 05/11/2019, 1:50 PM  Iron/TIBC/Ferritin/ %Sat    Component Value Date/Time   IRON 42 09/08/2018 1324   TIBC 318 09/08/2018 1324   FERRITIN 39 09/08/2018 1324   IRONPCTSAT 13 09/08/2018 1324   Recent Labs  Lab 05/10/19 0610  NA 142  K 3.9  CL 108  CO2 18*  GLUCOSE 120*  BUN 106*  CREATININE 10.23*  CALCIUM 9.0  PHOS 7.3*  ALBUMIN 3.2*   No results for input(s): AST, ALT, ALKPHOS, BILITOT, PROT in the last 168 hours. Recent Labs  Lab 05/11/19 0505  WBC 7.1  HGB 10.0*  HCT 31.5*  PLT 189

## 2019-05-11 NOTE — Progress Notes (Addendum)
Received call from Unit RN to assist with oxygen. Provided number for [employer] after hours number. Unit RN will call to see if they can deliver oxygen this evening. Jonnie Finner RN CCM Case Mgmt phone [phone]  Army Fossa, [employer] rep and left message. Contacted [employer] after hours number and referral center is closed. Pt will not be able to dc without oxygen. Spoke to pt and made aware. Updated Unit RN. Contacted [facility], waiting Jonnie Finner RN CCM Case Mgmt phone [phone]  7:05 pm Eustace Quail and they can deliver oxygen tonight. Spoke to pt and she has a Conservation officer, nature at home but it is 69 years old for her [place]. Made [facility] aware and they can still provide a new set up. Pt has Rollator and nebulizer machine in the room that was deliver by [employer] earlier today. Verified address.  Jonnie Finner RN CCM Case Mgmt phone [phone]

## 2019-05-11 NOTE — TOC Transition Note (Signed)
Transition of Care [facility]) - CM/SW Discharge Note   Patient Details  Name: Kari Robinson MRN: [id number] Date of Birth: [birth year]  [facility] CM/SW Contact:  Zenon Mayo, RN Phone Number: 05/11/2019, 5:08 PM   Clinical Narrative:    Patient for dc today, needing HHPT, rollator, and neb machine.  NCM offered choice, she chose [facility], referral made to [place] , awaiting to hear back if she can take referral.  Referral made to [facility] with [facility] for rollator and neb machine.  Patient states she has no problem getting her medications , she has transportation at dc. Tiffany called back states they are not able to take referral.  NCM made referral to [facility] with Alvis Lemmings, he was able to take referral.  Soc will begin 24-48 hrs post dc. NCM notified that patient also needs home oxygen. Referral made to [facility] for he will bring up to patient's room.    Final next level of care: [place] Barriers to Discharge: No Barriers Identified   Patient Goals and CMS Choice Patient states their goals for this hospitalization and ongoing recovery are:: to get better CMS Medicare.gov Compare Post Acute Care list provided to:: Patient Choice offered to / list presented to : Patient  Discharge Placement                       Discharge Plan and Services                DME Arranged: Nebulizer machine, Oxygen, Walker rolling with seat DME Agency: [employer] Date DME Agency Contacted: 05/11/19 Time DME Agency Contacted: [phone] Representative spoke with at DME Agency: zack HH Arranged: PT [place]: [place] Date [place]: 05/11/19 Time [place]: 1708 Representative spoke with at [place]: cory  Social Determinants of Health ([place]) Interventions     Readmission Risk Interventions No flowsheet data found.

## 2019-05-11 NOTE — Plan of Care (Signed)

## 2019-05-11 NOTE — Progress Notes (Signed)
ANTICOAGULATION CONSULT NOTE - Follow Up Consult  Pharmacy Consult for heparin Indication: atrial fibrillation  Labs: Recent Labs    05/08/19 0906  05/09/19 0455 05/09/19 0940 05/09/19 1426 05/09/19 1904 05/10/19 0610 05/10/19 2344  HGB 10.8*  --  10.3*  --   --   --  9.0*  --   HCT 34.9*  --  33.0*  --   --   --  28.5*  --   PLT 172  --  172  --   --   --  152  --   HEPARINUNFRC  --   --   --   --   --   --   --  0.34  CREATININE 8.69*  --  8.99*  --   --   --  10.23*  --   TROPONINI  --    < > 0.17* 0.18* 0.14* 0.14*  --   --    < > = values in this interval not displayed.    Assessment/Plan:  68yo female therapeutic on heparin with initial dosing for Afib. Will continue gtt at current rate and confirm stable with am labs.   Wynona Neat, PharmD, BCPS  05/11/2019,12:23 AM

## 2019-05-11 NOTE — Progress Notes (Signed)
Physical Therapy Treatment Patient Details Name: Kari Robinson MRN: [id number] DOB: [birth year] Today's Date: 05/11/2019    History of Present Illness Pt is a 68 yo female s/p Biapical emphysema, tachypneic, tachycardia and acute respiratory failure with elevaetd troponin. PMHx: COPD, HTN, HLD, depression, ESRD on MWF.    PT Comments    Pt making good progress with mobility. Recommend return home with family and HHPT. Recommend new rollator as pt's rollator is 56+ years old and in poor condition.    Follow Up Recommendations  Home health PT;Supervision - Intermittent     Equipment Recommendations  Other (comment)(rollator)    Recommendations for Other Services       Precautions / Restrictions Precautions Precautions: None Restrictions Weight Bearing Restrictions: No    Mobility  Bed Mobility Overal bed mobility: Modified Independent Bed Mobility: Supine to Sit     Supine to sit: Modified independent (Device/Increase time)        Transfers Overall transfer level: Needs assistance Equipment used: 4-wheeled walker Transfers: Sit to/from [facility] Sit to Stand: Supervision         General transfer comment: supervision for lines and safety.   Ambulation/Gait Ambulation/Gait assistance: Supervision Gait Distance (Feet): 80 Feet Assistive device: 4-wheeled walker Gait Pattern/deviations: Step-through pattern;Decreased stride length Gait velocity: decr Gait velocity interpretation: 1.31 - 2.62 ft/sec, indicative of limited community ambulator General Gait Details: supervision for lines/safety. No instability using rollator. Distance limited by need to return to the bathroom. Pt on 2L of O2 with SpO2 99%   Stairs             Wheelchair Mobility    Modified Rankin (Stroke Patients Only)       Balance Overall balance assessment: Needs assistance Sitting-balance support: No upper extremity supported;Feet supported Sitting  balance-Leahy Scale: Good     Standing balance support: No upper extremity supported;During functional activity Standing balance-Leahy Scale: Fair                              Cognition Arousal/Alertness: Awake/alert Behavior During Therapy: WFL for tasks assessed/performed Overall Cognitive Status: Within Functional Limits for tasks assessed                                        Exercises      General Comments        Pertinent Vitals/Pain Pain Assessment: No/denies pain    Home Living                      Prior Function            PT Goals (current goals can now be found in the care plan section) Progress towards PT goals: Progressing toward goals    Frequency    Min 3X/week      PT Plan Discharge plan needs to be updated    Co-evaluation              AM-PAC PT "6 Clicks" Mobility   Outcome Measure  Help needed turning from your back to your side while in a flat bed without using bedrails?: None Help needed moving from lying on your back to sitting on the side of a flat bed without using bedrails?: None Help needed moving to and from a bed to a chair (including a wheelchair)?: A Little  Help needed standing up from a chair using your arms (e.g., wheelchair or bedside chair)?: A Little Help needed to walk in hospital room?: A Little Help needed climbing 3-5 steps with a railing? : A Little 6 Click Score: 20    End of Session Equipment Utilized During Treatment: Gait belt;Oxygen Activity Tolerance: Patient tolerated treatment well Patient left: with call bell/phone within reach;in chair Nurse Communication: Mobility status PT Visit Diagnosis: Unsteadiness on feet (R26.81)     Time: 3785-8850 PT Time Calculation (min) (ACUTE ONLY): 24 min  Charges:  $Gait Training: 23-37 mins                     [place] Pager [phone] Office [place] 05/11/2019, 11:27 AM

## 2019-05-11 NOTE — Discharge Summary (Signed)
Physician Discharge Summary  Patient ID: Kari Robinson MRN: [id number] DOB/AGE: 1951/02/12 68 y.o.  Admit date: 05/09/2019 Discharge date: 05/11/2019  Admission Diagnoses:  Discharge Diagnoses:  Principal Problem:   COPD exacerbation ([place]) Active Problems:   Essential hypertension   Coronary atherosclerosis   Acute CHF (congestive heart failure) ([name])   Anemia of chronic disease   ESRD on dialysis ([place])   Tobacco abuse   Leukocytosis   Lumbar stenosis   Discharged Condition: stable  Hospital Course: Patient is a 68 year old female with past medical history significant for ESRDonHD (MWF),hypertension, hyperlipidemia, COPD on 2 L oxygenas needed, CHF with EF of 40%, depression, tobacco abuse, alcohol abuse in remission, CAD,andcarotid artery stenosis.  Patient presented to the hospital with 3-day history of progressively worseningshortness of breath.  Apparently, patient was seen in the ER in a day prior to admission and was discharged back home.  Patient had also administered hemodialysis session.  Patient's supplemental oxygen demand had also increased prior to admission.  Chest x-ray done on admission revealed emphysematous changes of the lung without any other acute abnormality noted. Patient was admitted for further assessment and management.  Shortness of breath improved significantly with hemodialysis.  During the hospital stay, patient went into atrial fibrillation with rapid ventricular response.  Patient was managed with Cardizem and heparin.  Cardiology team was consulted.  Cardiology team has advised discontinuation of anticoagulation based on fall risk.  Patient will follow up with cardiology on discharge as the patient will need event monitoring.  Further management of the atrial fibrillation will be as per patient's cardiology team.  Patient was also treated for possible COPD exacerbation.  Patient symptoms are resolved.  Patient will be discharged back home to the care  of the primary care provider, nephrology and cardiology teams.    Acute on chronic respiratory failure with hypoxia/COPD exacerbation: -Patient presents with progressively worsening shortness of breath. Normally only on 2 L of nasal cannulaoxygen as needed.  -Chest x-ray revealed emphysematous changes unchanged from previous chest x-ray.    -Patient was managed for possible COPD exacerbation -Symptoms also improved with hemodialysis-   ESRD on HD:  -Reported missing hemodialysis session.  -Hemodialysis was continued during the hospital stay.  Atrial fibrillation with rapid ventricular response: Patient was initially managed with Cardizem and heparin.. Cardiology team was consulted No on discharge and discharge due to fall risk Cardiac event monitoring on discharge Further management will be as patient's Primary Cardiology.  Elevated troponin, coronary artery disease: Acute on chronic.  On admission, troponin mildly elevated Patient is noted to have chronically elevated troponin.  No chest pain. Troponin was trended Continued aspirin, Plavix, statin, and isosorbide mononitrate   Systolic congestive heart failure: Last EF noted to be 40 to 45% as per echocardiogram done on 02/25/2019.  On admission BNP elevated at 3871.3.  No frank edema noted on the chest x-ray, but suspect some aspect of fluid overload. Hemodialysis was continued during the hospital stay.  L4-L5 lumbar stenosis and sciatica:  -Patient reports having severe pain in her lower legs.  -Previous MRI noting lumbar stenosis for which she was evaluated by neurosurgery during her May hospitalization. -PCP to consider re-consulting Neurosurgery -No back pain prior to discharge.  Essential hypertension:  Continue to optimize.    Hyperlipidemia -Continue atorvastatin  Anemia of chronic disease/CKD:  Hemoglobin prior to discharge was 10 g/dL.  History ofLung cancer:  Patient is status post left  lobectomy  Tobacco abuse:  Patient reports smoking 4 packs  of cigarettes per day on average for least 50 years, but just quit 1 month ago. -Encouraged continued cessation of tobacco use  Consults: cardiology and nephrology  Significant Diagnostic Studies:   Discharge Exam: Blood pressure 124/90, pulse 76, temperature 97.8 F (36.6 C), temperature source Oral, resp. rate 17, height 5\' 4"  (1.626 m), weight 69.2 kg, SpO2 100 %.  Disposition: Discharge disposition: 01-Home or Self Care   Discharge Instructions    Diet - low sodium heart healthy   Complete by: As directed    Increase activity slowly   Complete by: As directed      Allergies as of 05/11/2019      Reactions   Citalopram Hydrobromide Hives   Morphine And Related Itching      Medication List    STOP taking these medications   hydrALAZINE 50 MG tablet Commonly known as: APRESOLINE     TAKE these medications   albuterol 108 (90 Base) MCG/ACT inhaler Commonly known as: VENTOLIN HFA Inhale 2 puffs into the lungs every 4 (four) hours as needed for wheezing. What changed: Another medication with the same name was removed. Continue taking this medication, and follow the directions you see here.   aspirin EC 81 MG tablet Take 81 mg by mouth daily.   atorvastatin 40 MG tablet Commonly known as: LIPITOR Take 40 mg by mouth daily.   Auryxia 1 GM 210 MG(Fe) tablet Generic drug: ferric citrate Take 1 tablet by mouth 3 (three) times daily with meals.   b complex-vitamin c-folic acid 0.8 MG Tabs tablet Take 1 tablet by mouth See admin instructions. Take one tablet by mouth on Sunday, Tuesday, Thursday, Saturday mornings. Take one tablet after dialysis on Monday, Wednesday, Friday   budesonide-formoterol 160-4.5 MCG/ACT inhaler Commonly known as: SYMBICORT Inhale 2 puffs into the lungs 2 (two) times daily.   calcitRIOL 0.25 MCG capsule Commonly known as: ROCALTROL Take 5 capsules (1.25 mcg total) by mouth every  Monday, Wednesday, and Friday with hemodialysis for 30 days. Start taking on: May 12, 2019   cinacalcet 30 MG tablet Commonly known as: SENSIPAR Take 1 tablet (30 mg total) by mouth every Monday, Wednesday, and Friday at 6 PM. Start taking on: May 12, 2019   clopidogrel 75 MG tablet Commonly known as: PLAVIX Take 75 mg by mouth daily.   diclofenac sodium 1 % Gel Commonly known as: Voltaren Apply 2 g topically 4 (four) times daily.   ipratropium-albuterol 0.5-2.5 (3) MG/3ML Soln Commonly known as: DUONEB Take 3 mLs by nebulization every 6 (six) hours as needed.   isosorbide mononitrate 30 MG 24 hr tablet Commonly known as: IMDUR Take 30 mg by mouth at bedtime.   lidocaine-prilocaine cream Commonly known as: EMLA Apply 1 application topically every Monday, Wednesday, and Friday. Prior to dialysis   metoprolol succinate 50 MG 24 hr tablet Commonly known as: TOPROL-XL Take 1 tablet (50 mg total) by mouth daily. Take with or immediately following a meal. Start taking on: May 12, 2019   OXYGEN Inhale 2 L into the lungs continuous.   predniSONE 10 MG tablet Commonly known as: DELTASONE Prednisone 30 mg po daily for 3 days, then 20 mg po daily for 3 days and then 10 mg po daily for 3 days and stop   SEROquel 200 MG tablet Generic drug: QUEtiapine Take 200 mg by mouth at bedtime.   Spiriva HandiHaler 18 MCG inhalation capsule Generic drug: tiotropium Place 1 capsule (18 mcg total) into inhaler and inhale daily.  Durable Medical Equipment  (From admission, onward)         Start     Ordered   05/10/19 1219  For home use only DME Nebulizer machine  Once    Question Answer Comment  Patient needs a nebulizer to treat with the following condition COPD (chronic obstructive pulmonary disease) ([place])   Length of Need 12 Months      05/10/19 1219           Signed: Bonnell Public 05/11/2019, 12:32 PM

## 2019-05-11 NOTE — Progress Notes (Signed)
Occupational Therapy Treatment Patient Details Name: Kari Robinson MRN: [id number] DOB: 09-14-1951 Today's Date: 05/11/2019    History of present illness Pt is a 68 yo female s/p Biapical emphysema, tachypneic, tachycardia and acute respiratory failure with elevaetd troponin. PMHx: COPD, HTN, HLD, depression, ESRD on MWF.   OT comments  Pt performed toileting, standing grooming and LB dressing with supervision. Educated in energy conservation strategies and reinforced with handout. Pt is eager to go home this afternoon.  Follow Up Recommendations  Home health OT    Equipment Recommendations  None recommended by OT    Recommendations for Other Services      Precautions / Restrictions Precautions Precautions: None Restrictions Weight Bearing Restrictions: No       Mobility Bed Mobility Overal bed mobility: Modified Independent      General bed mobility comments: HOB up  Transfers Overall transfer level: Needs assistance Equipment used: 4-wheeled walker Transfers: Sit to/from Stand Sit to Stand: Supervision         General transfer comment: supervision for lines and safety.     Balance Overall balance assessment: Needs assistance Sitting-balance support: No upper extremity supported;Feet supported Sitting balance-Leahy Scale: Good     Standing balance support: No upper extremity supported;During functional activity Standing balance-Leahy Scale: Fair Standing balance comment: statically at sink                           ADL either performed or assessed with clinical judgement   ADL Overall ADL's : Needs assistance/impaired     Grooming: Wash/dry hands;Standing;Supervision/safety               Lower Body Dressing: Set up;Sit to/from stand Lower Body Dressing Details (indicate cue type and reason): Pt able to reach feet, also has AE at home. Toilet Transfer: Supervision/safety;Ambulation;RW   Toileting- Clothing Manipulation and  Hygiene: Supervision/safety;Sit to/from stand       Functional mobility during ADLs: Supervision/safety;Rolling walker General ADL Comments: pt knowledgeable in pursed lip breathing techniques, educated in energy conservation and reinforced with handout, instructed pt to use 02 tank during seated showering     Vision       Perception     Praxis      Cognition Arousal/Alertness: Awake/alert Behavior During Therapy: WFL for tasks assessed/performed Overall Cognitive Status: Within Functional Limits for tasks assessed                                 General Comments: pt with some baseline memory deficits with regard to recent hospitalizations        Exercises     Shoulder Instructions       General Comments      Pertinent Vitals/ Pain       Pain Assessment: Faces Faces Pain Scale: Hurts little more Pain Location: back Pain Descriptors / Indicators: Aching Pain Intervention(s): Repositioned;Monitored during session  Home Living                                          Prior Functioning/Environment              Frequency  Min 2X/week        Progress Toward Goals  OT Goals(current goals can now be found in the care plan section)  Progress  towards OT goals: Progressing toward goals  Acute Rehab OT Goals Patient Stated Goal: to get my legs stronger OT Goal Formulation: With patient Time For Goal Achievement: 05/23/19 Potential to Achieve Goals: Good  Plan Discharge plan needs to be updated    Co-evaluation                 AM-PAC OT "6 Clicks" Daily Activity     Outcome Measure   Help from another person eating meals?: None Help from another person taking care of personal grooming?: A Little Help from another person toileting, which includes using toliet, bedpan, or urinal?: A Little Help from another person bathing (including washing, rinsing, drying)?: A Little Help from another person to put on and taking  off regular upper body clothing?: None Help from another person to put on and taking off regular lower body clothing?: A Little 6 Click Score: 20    End of Session Equipment Utilized During Treatment: Gait belt;Rolling walker;Oxygen  OT Visit Diagnosis: Unsteadiness on feet (R26.81);Muscle weakness (generalized) (M62.81);Pain   Activity Tolerance Patient tolerated treatment well   Patient Left in bed;with call bell/phone within reach   Nurse Communication          Time: 1255-1316 OT Time Calculation (min): 21 min  Charges: OT General Charges $OT Visit: 1 Visit OT Treatments $Self Care/Home Management : 8-22 mins  Nestor Lewandowsky, OTR/L Acute Rehabilitation Services Pager: [phone] Office: [phone]   Malka So 05/11/2019, 1:24 PM

## 2019-05-11 NOTE — Progress Notes (Signed)
Progress Note  Patient Name: Kari Robinson Date of Encounter: 05/11/2019  Primary Cardiologist:   No primary care provider on file.   Subjective   Slept well last night.  No palpitations. No chest pain.   Inpatient Medications    Scheduled Meds: . arformoterol  15 mcg Nebulization BID  . aspirin EC  81 mg Oral Daily  . atorvastatin  40 mg Oral Daily  . budesonide (PULMICORT) nebulizer solution  0.5 mg Nebulization BID  . calcitRIOL  1.25 mcg Oral Q M,W,F-HD  . Chlorhexidine Gluconate Cloth  6 each Topical Q0600  . cinacalcet  30 mg Oral Q M,W,F-1800  . clopidogrel  75 mg Oral Daily  . ferric citrate  210 mg Oral TID WC  . guaiFENesin  600 mg Oral BID  . ipratropium  0.5 mg Nebulization BID  . isosorbide mononitrate  30 mg Oral QHS  . levalbuterol  0.63 mg Nebulization BID  . lidocaine-prilocaine  1 application Topical Q M,W,F  . metoprolol succinate  50 mg Oral Daily  . predniSONE  40 mg Oral Q breakfast  . QUEtiapine  200 mg Oral QHS  . sodium chloride flush  3 mL Intravenous Q12H   Continuous Infusions: . sodium chloride    . sodium chloride    . cefTRIAXone (ROCEPHIN)  IV 1 g (05/11/19 0811)  . heparin 800 Units/hr (05/10/19 1706)   PRN Meds: sodium chloride, sodium chloride, acetaminophen **OR** acetaminophen, alteplase, hydrALAZINE, lidocaine (PF), lidocaine-prilocaine, ondansetron **OR** ondansetron (ZOFRAN) IV, pentafluoroprop-tetrafluoroeth   Vital Signs    Vitals:   05/11/19 0737 05/11/19 0743 05/11/19 0746 05/11/19 0749  BP: 111/63     Pulse: 77     Resp: 13     Temp: (!) 97.5 F (36.4 C)     TempSrc: Oral     SpO2: 98% 99% 100% 100%  Weight:      Height:        Intake/Output Summary (Last 24 hours) at 05/11/2019 0845 Last data filed at 05/11/2019 0600 Gross per 24 hour  Intake 442.2 ml  Output 1 ml  Net 441.2 ml   Filed Weights   05/09/19 1415 05/10/19 1010  Weight: 68.5 kg 69.2 kg    Telemetry    NSR, PVCs - Personally Reviewed   ECG    NA - Personally Reviewed  Physical Exam   GEN: No acute distress.   Neck: No  JVD Cardiac: RRR, n murmurs, rubs, or gallops.  Respiratory: Clear  to auscultation bilaterally. GI: Soft, nontender, non-distended  MS: No  edema; No deformity. Neuro:  Nonfocal  Psych: Normal affect   Labs    Chemistry Recent Labs  Lab 05/08/19 0906 05/09/19 0455 05/10/19 0610  NA 140 140 142  K 4.1 4.1 3.9  CL 105 108 108  CO2 18* 15* 18*  GLUCOSE 103* 112* 120*  BUN 76* 86* 106*  CREATININE 8.69* 8.99* 10.23*  CALCIUM 9.5 9.1 9.0  ALBUMIN  --   --  3.2*  GFRNONAA 4* 4* 3*  GFRAA 5* 5* 4*  ANIONGAP 17* 17* 16*     Hematology Recent Labs  Lab 05/09/19 0455 05/10/19 0610 05/11/19 0505  WBC 10.8* 7.5 7.1  RBC 3.19* 2.80* 3.12*  HGB 10.3* 9.0* 10.0*  HCT 33.0* 28.5* 31.5*  MCV 103.4* 101.8* 101.0*  MCH 32.3 32.1 32.1  MCHC 31.2 31.6 31.7  RDW 15.6* 15.9* 16.0*  PLT 172 152 189    Cardiac Enzymes Recent Labs  Lab 05/09/19  5956 05/09/19 0940 05/09/19 1426 05/09/19 1904  TROPONINI 0.17* 0.18* 0.14* 0.14*   No results for input(s): TROPIPOC in the last 168 hours.   BNP Recent Labs  Lab 05/09/19 0455  BNP 3,871.3*     DDimer No results for input(s): DDIMER in the last 168 hours.   Radiology    No results found.  Cardiac Studies   NA  Patient Profile     68 y.o. female with a hx of CAD and ischemic cardiomyopathy who is being seen for the evaluation of atrial fib at the request of Dr. Marthenia Rolling.  Assessment & Plan    ATRIAL FIB:      No recurrence.  I stopped Cardizem and started beta blocker.  No indication for warfarin in this patient who is high risk for bleeding complications based on this isolated episode of fib.  However, if she has continued palpitations as an outpatient she would likely need an event monitor.     CHRONIC DIASTOLIC HF:  Volume management per nephrology.   CAD:    Elevated troponin with flat trend non diagnostic for ACS in this  situation.  The fact that it was not more elevated and that she had no pain with her rapid atrial fibrillation would argue against any unstable coronary syndrome.  Continue medical management.  Added beta blocker as above.   For questions or updates, please contact [place] Please consult [email or web address] for contact info under Cardiology/STEMI.   Signed, Minus Breeding, MD  05/11/2019, 8:45 AM

## 2019-05-11 NOTE — Progress Notes (Signed)
[employer] cab service was called to transport patient home.  Patient was taken to [facility] tower to wait for cab.  Discharge paperwork was reviewed with patient.  This RN confirmed that prescriptions were waiting for pick-up at [facility] on [address].

## 2019-05-11 NOTE — TOC Transition Note (Addendum)
Transition of Care [facility]) - CM/SW Discharge Note   Patient Details  Name: Kari Robinson MRN: [id number] Date of Birth: September 15, 1951  [facility] of [facility] CM/SW Contact:  Zenon Mayo, RN Phone Number: 05/11/2019, 1:38 PM   Clinical Narrative:    Patient for dc today, needing HHPT, rollator, and neb machine.  NCM offered choice, she chose [facility], referral made to [place] , awaiting to hear back if she can take referral.  Referral made to [facility] with [employer] for rollator and neb machine.  Patient states she has no problem getting her medications , she has transportation at dc. [facility]iffany called back states they are not able to take referral.  NCM made referral to [facility] with Alvis Lemmings, he was able to take referral.  Soc will begin 24-48 hrs post dc.    Final next level of care: [place] Barriers to Discharge: No Barriers Identified   Patient Goals and CMS Choice Patient states their goals for this hospitalization and ongoing recovery are:: to get better CMS Medicare.gov Compare Post Acute Care list provided to:: Patient Choice offered to / list presented to : Patient  Discharge Placement                       Discharge Plan and Services                DME Arranged: Nebulizer machine, Walker rolling with seat DME Agency: [employer] Date DME Agency Contacted: 05/11/19 Time DME Agency Contacted: [phone] Representative spoke with at DME Agency: zack HH Arranged: PT [place]: [facility] [employer]) Date [place]: 05/11/19 Time [place]: [place] Representative spoke with at [place]: [place] ([place]) Interventions     Readmission Risk Interventions No flowsheet data found.

## 2019-06-16 ENCOUNTER — Encounter (HOSPITAL_COMMUNITY): Payer: Self-pay

## 2019-06-16 ENCOUNTER — Emergency Department (HOSPITAL_COMMUNITY): Payer: Medicare Other

## 2019-06-16 ENCOUNTER — Inpatient Hospital Stay (HOSPITAL_COMMUNITY)
Admission: EM | Admit: 2019-06-16 | Discharge: 2019-06-20 | DRG: 193 | Disposition: A | Discharge status: Home Health | Payer: Medicare Other | Attending: Internal Medicine | Admitting: Internal Medicine

## 2019-06-16 ENCOUNTER — Other Ambulatory Visit: Payer: Self-pay

## 2019-06-16 DIAGNOSIS — R569 Unspecified convulsions: Secondary | ICD-10-CM | POA: Diagnosis present

## 2019-06-16 DIAGNOSIS — Z79899 Other long term (current) drug therapy: Secondary | ICD-10-CM

## 2019-06-16 DIAGNOSIS — J9601 Acute respiratory failure with hypoxia: Secondary | ICD-10-CM

## 2019-06-16 DIAGNOSIS — Z87891 Personal history of nicotine dependence: Secondary | ICD-10-CM

## 2019-06-16 DIAGNOSIS — I701 Atherosclerosis of renal artery: Secondary | ICD-10-CM | POA: Diagnosis present

## 2019-06-16 DIAGNOSIS — Z885 Allergy status to narcotic agent status: Secondary | ICD-10-CM

## 2019-06-16 DIAGNOSIS — Z992 Dependence on renal dialysis: Secondary | ICD-10-CM

## 2019-06-16 DIAGNOSIS — Z9981 Dependence on supplemental oxygen: Secondary | ICD-10-CM

## 2019-06-16 DIAGNOSIS — J441 Chronic obstructive pulmonary disease with (acute) exacerbation: Secondary | ICD-10-CM

## 2019-06-16 DIAGNOSIS — G4700 Insomnia, unspecified: Secondary | ICD-10-CM | POA: Diagnosis present

## 2019-06-16 DIAGNOSIS — Z825 Family history of asthma and other chronic lower respiratory diseases: Secondary | ICD-10-CM

## 2019-06-16 DIAGNOSIS — Z902 Acquired absence of lung [part of]: Secondary | ICD-10-CM

## 2019-06-16 DIAGNOSIS — R778 Other specified abnormalities of plasma proteins: Secondary | ICD-10-CM

## 2019-06-16 DIAGNOSIS — I252 Old myocardial infarction: Secondary | ICD-10-CM

## 2019-06-16 DIAGNOSIS — E871 Hypo-osmolality and hyponatremia: Secondary | ICD-10-CM | POA: Diagnosis present

## 2019-06-16 DIAGNOSIS — I480 Paroxysmal atrial fibrillation: Secondary | ICD-10-CM | POA: Diagnosis present

## 2019-06-16 DIAGNOSIS — I493 Ventricular premature depolarization: Secondary | ICD-10-CM | POA: Diagnosis present

## 2019-06-16 DIAGNOSIS — J440 Chronic obstructive pulmonary disease with acute lower respiratory infection: Secondary | ICD-10-CM | POA: Diagnosis present

## 2019-06-16 DIAGNOSIS — I5042 Chronic combined systolic (congestive) and diastolic (congestive) heart failure: Secondary | ICD-10-CM | POA: Diagnosis present

## 2019-06-16 DIAGNOSIS — J189 Pneumonia, unspecified organism: Secondary | ICD-10-CM | POA: Diagnosis not present

## 2019-06-16 DIAGNOSIS — Z85118 Personal history of other malignant neoplasm of bronchus and lung: Secondary | ICD-10-CM

## 2019-06-16 DIAGNOSIS — I132 Hypertensive heart and chronic kidney disease with heart failure and with stage 5 chronic kidney disease, or end stage renal disease: Secondary | ICD-10-CM | POA: Diagnosis present

## 2019-06-16 DIAGNOSIS — D631 Anemia in chronic kidney disease: Secondary | ICD-10-CM | POA: Diagnosis present

## 2019-06-16 DIAGNOSIS — N186 End stage renal disease: Secondary | ICD-10-CM

## 2019-06-16 DIAGNOSIS — R0602 Shortness of breath: Secondary | ICD-10-CM | POA: Diagnosis not present

## 2019-06-16 DIAGNOSIS — N2581 Secondary hyperparathyroidism of renal origin: Secondary | ICD-10-CM | POA: Diagnosis present

## 2019-06-16 DIAGNOSIS — I083 Combined rheumatic disorders of mitral, aortic and tricuspid valves: Secondary | ICD-10-CM | POA: Diagnosis present

## 2019-06-16 DIAGNOSIS — M4800 Spinal stenosis, site unspecified: Secondary | ICD-10-CM | POA: Diagnosis present

## 2019-06-16 DIAGNOSIS — I255 Ischemic cardiomyopathy: Secondary | ICD-10-CM | POA: Diagnosis present

## 2019-06-16 DIAGNOSIS — I25118 Atherosclerotic heart disease of native coronary artery with other forms of angina pectoris: Secondary | ICD-10-CM | POA: Diagnosis present

## 2019-06-16 DIAGNOSIS — J9611 Chronic respiratory failure with hypoxia: Secondary | ICD-10-CM

## 2019-06-16 DIAGNOSIS — Z7982 Long term (current) use of aspirin: Secondary | ICD-10-CM

## 2019-06-16 DIAGNOSIS — Z7951 Long term (current) use of inhaled steroids: Secondary | ICD-10-CM

## 2019-06-16 DIAGNOSIS — Z7902 Long term (current) use of antithrombotics/antiplatelets: Secondary | ICD-10-CM

## 2019-06-16 DIAGNOSIS — F329 Major depressive disorder, single episode, unspecified: Secondary | ICD-10-CM | POA: Diagnosis present

## 2019-06-16 DIAGNOSIS — Z20828 Contact with and (suspected) exposure to other viral communicable diseases: Secondary | ICD-10-CM | POA: Diagnosis present

## 2019-06-16 DIAGNOSIS — E785 Hyperlipidemia, unspecified: Secondary | ICD-10-CM | POA: Diagnosis present

## 2019-06-16 DIAGNOSIS — Z888 Allergy status to other drugs, medicaments and biological substances status: Secondary | ICD-10-CM

## 2019-06-16 DIAGNOSIS — R7989 Other specified abnormal findings of blood chemistry: Secondary | ICD-10-CM

## 2019-06-16 LAB — CBC
HCT: 29.7 % — ABNORMAL LOW (ref 36.0–46.0)
Hemoglobin: 9.1 g/dL — ABNORMAL LOW (ref 12.0–15.0)
MCH: 33.3 pg (ref 26.0–34.0)
MCHC: 30.6 g/dL (ref 30.0–36.0)
MCV: 108.8 fL — ABNORMAL HIGH (ref 80.0–100.0)
Platelets: 249 10*3/uL (ref 150–400)
RBC: 2.73 MIL/uL — ABNORMAL LOW (ref 3.87–5.11)
RDW: 18.8 % — ABNORMAL HIGH (ref 11.5–15.5)
WBC: 13.9 10*3/uL — ABNORMAL HIGH (ref 4.0–10.5)
nRBC: 0.2 % (ref 0.0–0.2)

## 2019-06-16 LAB — TROPONIN I (HIGH SENSITIVITY)
Troponin I (High Sensitivity): 81 ng/L — ABNORMAL HIGH (ref <18)
Troponin I (High Sensitivity): 132 ng/L (ref <18)
Troponin I (High Sensitivity): 235 ng/L (ref <18)

## 2019-06-16 LAB — COMPREHENSIVE METABOLIC PANEL
ALT: 11 U/L (ref 0–44)
AST: 18 U/L (ref 15–41)
Albumin: 3.7 g/dL (ref 3.5–5.0)
Alkaline Phosphatase: 70 U/L (ref 38–126)
Anion gap: 20 — ABNORMAL HIGH (ref 5–15)
BUN: 50 mg/dL — ABNORMAL HIGH (ref 8–23)
CO2: 21 mmol/L — ABNORMAL LOW (ref 22–32)
Calcium: 9.8 mg/dL (ref 8.9–10.3)
Chloride: 92 mmol/L — ABNORMAL LOW (ref 98–111)
Creatinine, Ser: 8.66 mg/dL — ABNORMAL HIGH (ref 0.44–1.00)
GFR calc Af Amer: 5 mL/min — ABNORMAL LOW (ref >60)
GFR calc non Af Amer: 4 mL/min — ABNORMAL LOW (ref >60)
Glucose, Bld: 178 mg/dL — ABNORMAL HIGH (ref 70–99)
Potassium: 4.4 mmol/L (ref 3.5–5.1)
Sodium: 133 mmol/L — ABNORMAL LOW (ref 135–145)
Total Bilirubin: 0.4 mg/dL (ref 0.3–1.2)
Total Protein: 6.6 g/dL (ref 6.5–8.1)

## 2019-06-16 LAB — SARS CORONAVIRUS 2 BY RT PCR (HOSPITAL ORDER, PERFORMED IN ~~LOC~~ HOSPITAL LAB): SARS Coronavirus 2: NEGATIVE

## 2019-06-16 LAB — MRSA PCR SCREENING: MRSA by PCR: NEGATIVE

## 2019-06-16 MED ORDER — SODIUM CHLORIDE 0.9 % IV SOLN
500.0000 mg | INTRAVENOUS | Status: DC
  Administered 2019-06-16 – 2019-06-19 (×4): 500 mg via INTRAVENOUS
  Filled 2019-06-16 (×5): qty 500

## 2019-06-16 MED ORDER — IPRATROPIUM-ALBUTEROL 0.5-2.5 (3) MG/3ML IN SOLN
3.0000 mL | Freq: Four times a day (QID) | RESPIRATORY_TRACT | Status: DC
  Administered 2019-06-16: 3 mL via RESPIRATORY_TRACT
  Filled 2019-06-16: qty 3

## 2019-06-16 MED ORDER — ALBUTEROL SULFATE HFA 108 (90 BASE) MCG/ACT IN AERS
6.0000 | INHALATION_SPRAY | Freq: Once | RESPIRATORY_TRACT | Status: AC
  Administered 2019-06-16: 6 via RESPIRATORY_TRACT
  Filled 2019-06-16: qty 6.7

## 2019-06-16 MED ORDER — PREDNISONE 20 MG PO TABS
40.0000 mg | ORAL_TABLET | Freq: Every day | ORAL | Status: DC
  Administered 2019-06-17 – 2019-06-20 (×4): 40 mg via ORAL
  Filled 2019-06-16 (×5): qty 2

## 2019-06-16 MED ORDER — HEPARIN (PORCINE) 25000 UT/250ML-% IV SOLN
950.0000 [IU]/h | INTRAVENOUS | Status: DC
  Administered 2019-06-16 – 2019-06-17 (×2): 950 [IU]/h via INTRAVENOUS
  Filled 2019-06-16 (×2): qty 250

## 2019-06-16 MED ORDER — SODIUM CHLORIDE 0.9 % IV SOLN
INTRAVENOUS | Status: DC
  Administered 2019-06-16: 17:00:00 via INTRAVENOUS

## 2019-06-16 MED ORDER — CLOPIDOGREL BISULFATE 75 MG PO TABS
75.0000 mg | ORAL_TABLET | Freq: Every day | ORAL | Status: DC
  Administered 2019-06-17 – 2019-06-20 (×4): 75 mg via ORAL
  Filled 2019-06-16 (×4): qty 1

## 2019-06-16 MED ORDER — ATORVASTATIN CALCIUM 40 MG PO TABS
40.0000 mg | ORAL_TABLET | Freq: Every day | ORAL | Status: DC
  Administered 2019-06-17 – 2019-06-20 (×4): 40 mg via ORAL
  Filled 2019-06-16 (×4): qty 1

## 2019-06-16 MED ORDER — IPRATROPIUM-ALBUTEROL 0.5-2.5 (3) MG/3ML IN SOLN
3.0000 mL | RESPIRATORY_TRACT | Status: DC
  Administered 2019-06-16 – 2019-06-20 (×16): 3 mL via RESPIRATORY_TRACT
  Filled 2019-06-16 (×22): qty 3

## 2019-06-16 MED ORDER — ASPIRIN EC 81 MG PO TBEC
81.0000 mg | DELAYED_RELEASE_TABLET | Freq: Every day | ORAL | Status: DC
  Administered 2019-06-17 – 2019-06-20 (×4): 81 mg via ORAL
  Filled 2019-06-16 (×4): qty 1

## 2019-06-16 MED ORDER — ISOSORBIDE MONONITRATE ER 30 MG PO TB24
30.0000 mg | ORAL_TABLET | Freq: Every day | ORAL | Status: DC
  Administered 2019-06-17 – 2019-06-19 (×3): 30 mg via ORAL
  Filled 2019-06-16 (×3): qty 1

## 2019-06-16 MED ORDER — CALCITRIOL 0.25 MCG PO CAPS: 1.2500 ug | ORAL_CAPSULE | ORAL | Status: DC

## 2019-06-16 MED ORDER — HEPARIN BOLUS VIA INFUSION
4000.0000 [IU] | Freq: Once | INTRAVENOUS | Status: AC
  Administered 2019-06-16: 4000 [IU] via INTRAVENOUS
  Filled 2019-06-16: qty 4000

## 2019-06-16 MED ORDER — CHLORHEXIDINE GLUCONATE CLOTH 2 % EX PADS
6.0000 | MEDICATED_PAD | Freq: Every day | CUTANEOUS | Status: DC
  Administered 2019-06-18 – 2019-06-19 (×2): 6 via TOPICAL

## 2019-06-16 MED ORDER — METHYLPREDNISOLONE SODIUM SUCC 125 MG IJ SOLR: 125.0000 mg | Freq: Once | INTRAMUSCULAR | Status: DC

## 2019-06-16 MED ORDER — IPRATROPIUM BROMIDE HFA 17 MCG/ACT IN AERS
2.0000 | INHALATION_SPRAY | Freq: Once | RESPIRATORY_TRACT | Status: DC
  Filled 2019-06-16: qty 12.9

## 2019-06-16 MED ORDER — METOPROLOL SUCCINATE ER 50 MG PO TB24
50.0000 mg | ORAL_TABLET | Freq: Every day | ORAL | Status: DC
  Administered 2019-06-17 – 2019-06-20 (×4): 50 mg via ORAL
  Filled 2019-06-16 (×4): qty 1

## 2019-06-16 MED ORDER — QUETIAPINE FUMARATE 100 MG PO TABS
200.0000 mg | ORAL_TABLET | Freq: Every day | ORAL | Status: DC
  Administered 2019-06-17 – 2019-06-19 (×3): 200 mg via ORAL
  Filled 2019-06-16: qty 2
  Filled 2019-06-16: qty 1
  Filled 2019-06-16 (×2): qty 2

## 2019-06-16 MED ORDER — CINACALCET HCL 30 MG PO TABS
30.0000 mg | ORAL_TABLET | ORAL | Status: DC
  Administered 2019-06-19: 30 mg via ORAL
  Filled 2019-06-16: qty 1

## 2019-06-16 MED ORDER — HEPARIN SODIUM (PORCINE) 5000 UNIT/ML IJ SOLN: 5000.0000 [IU] | Freq: Three times a day (TID) | INTRAMUSCULAR | Status: DC

## 2019-06-16 MED ORDER — ALBUTEROL SULFATE HFA 108 (90 BASE) MCG/ACT IN AERS
4.0000 | INHALATION_SPRAY | Freq: Once | RESPIRATORY_TRACT | Status: AC
  Administered 2019-06-16: 4 via RESPIRATORY_TRACT

## 2019-06-16 MED ORDER — SODIUM CHLORIDE 0.9 % IV SOLN
2.0000 g | INTRAVENOUS | Status: DC
  Administered 2019-06-16 – 2019-06-19 (×4): 2 g via INTRAVENOUS
  Filled 2019-06-16 (×2): qty 20
  Filled 2019-06-16 (×3): qty 2

## 2019-06-16 NOTE — ED Provider Notes (Addendum)
[facility] Provider Note   CSN: [id number] Arrival date & time: 06/16/19  1624     History   Chief Complaint Chief Complaint  Patient presents with   Shortness of Breath    HPI Kari Robinson is a 68 y.o. female.     Patient with hx copd, presents with increased cough and increased dyspnea in past 1-2 days. Symptoms acute onset, severe, constant, persistent, worse this afternoon, mildly improved w oxygen and tx by EMS. EMS gave Mg, albuterol, o2. Denies sore throat or runny nose. No fever/chills. +chest tightness, constant, persistent. No episodic or exertional chest pain. No pleuritic pain. No leg pain or swelling. States compliant with home meds. +smoker.   The history is provided by the patient and the EMS personnel.  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  No details.     RAS (renal artery stenosis) ([place]) 2002   by cath tysinger   Smoking     Patient Active Problem List   Diagnosis Date Noted   COPD exacerbation ([place]) 05/09/2019   Leukocytosis 05/09/2019   Lumbar stenosis 05/09/2019   Fall 04/01/2019   Leg weakness, bilateral 04/01/2019   Tobacco abuse 04/01/2019   Alcohol abuse 04/01/2019   Transient loss of consciousness 02/24/2019   ESRD on dialysis ([place]) 02/24/2019   Seizure-like activity ([place]) 02/24/2019   NSTEMI (non-ST elevated myocardial  infarction) ([place]) 02/24/2019   Ischemic cardiomyopathy 02/24/2019   Hypertensive emergency 09/30/2018   Type II diabetes mellitus with renal manifestations ([place]) 09/30/2018   HLD (hyperlipidemia) 09/30/2018   Depression with anxiety 09/30/2018   CKD (chronic kidney disease), stage V ([place]) 09/30/2018   Acute on chronic respiratory failure with hypoxia ([place]) 09/30/2018   Fluid overload 09/30/2018   CAD (coronary artery disease) 09/30/2018   Anemia of chronic disease 09/30/2018   COPD (chronic obstructive pulmonary disease) ([place])    Acute on chronic combined systolic and diastolic CHF (congestive heart failure) ([place])    Solitary right kidney 08/65/7846   Chronic systolic CHF (congestive heart failure) ([place]) 06/24/2018   Acute CHF (congestive heart failure) ([place]) 06/07/2018   Hypertensive emergency 06/07/2018   CKD (chronic kidney disease) stage 5, GFR less than 15 ml/min ([name]) 06/07/2018   Normocytic normochromic anemia 06/07/2018   DM (diabetes mellitus), type 2 with renal complications ([place]) 96/29/5284   Hypertensive urgency 06/07/2018   Occlusion and stenosis of carotid artery without mention of cerebral infarction 01/19/2012   RENAL ARTERY STENOSIS 09/10/2010   ABDOMINAL BRUIT 08/12/2010   DEPRESSION 08/11/2010   PERIPHERAL VASCULAR DISEASE 08/11/2010   Essential hypertension 08/08/2010   Coronary atherosclerosis 08/08/2010   ARTHRALGIA 08/08/2010    Past Surgical History:  Procedure Laterality Date   abdominal aortogram     perclose of the right femoral artery   AV FISTULA PLACEMENT Right 07/11/2018  Procedure: Right arm ARTERIOVENOUS FISTULA CREATION;  Surgeon: [name], MD;  Location: [facility] OR;  Service: Vascular;  Laterality: Right;   AVF placement Right    CARDIAC CATHETERIZATION     left heart catheterization.  Coronary cineangiography. Lft ventricular cineangiography.     LUNG SURGERY     due to lung cancer per patient's report     TUBAL LIGATION       OB History   No obstetric history on file.      Home Medications    Prior to Admission medications   Medication Sig Start Date End Date Taking? Authorizing Provider  albuterol (PROVENTIL HFA;VENTOLIN HFA) 108 (90 Base) MCG/ACT inhaler Inhale 2 puffs into the lungs every 4 (four) hours as needed for wheezing.  11/25/17   [provider]  aspirin EC 81 MG tablet Take 81 mg by mouth daily.    [provider]  atorvastatin (LIPITOR) 40 MG tablet Take 40 mg by mouth daily.    [provider]  AURYXIA 1 GM 210 MG(Fe) tablet Take 1 tablet by mouth 3 (three) times daily with meals. 04/06/19   [provider]  b complex-vitamin c-folic acid (NEPHRO-VITE) 0.8 MG TABS tablet Take 1 tablet by mouth See admin instructions. Take one tablet by mouth on Sunday, Tuesday, Thursday, Saturday mornings. Take one tablet after dialysis on Monday, Wednesday, Friday 02/06/19   [provider]  budesonide-formoterol (SYMBICORT) 160-4.5 MCG/ACT inhaler Inhale 2 puffs into the lungs 2 (two) times daily.    [provider]  cinacalcet (SENSIPAR) 30 MG tablet Take 1 tablet (30 mg total) by mouth every Monday, Wednesday, and Friday at 6 PM. 05/12/19   Dana Allan I, MD  clopidogrel (PLAVIX) 75 MG tablet Take 75 mg by mouth daily.    [provider]  diclofenac sodium (VOLTAREN) 1 % GEL Apply 2 g topically 4 (four) times daily. 02/27/19   Hedges, Dellis Filbert, PA-C  ipratropium-albuterol (DUONEB) 0.5-2.5 (3) MG/3ML SOLN Take 3 mLs by nebulization every 6 (six) hours as needed. 05/11/19   Bonnell Public, MD  isosorbide mononitrate (IMDUR) 30 MG 24 hr tablet Take 30 mg by mouth at bedtime.     [provider]  lidocaine-prilocaine (EMLA) cream Apply 1 application topically every Monday, Wednesday, and Friday. Prior to dialysis 04/06/19   [provider]  metoprolol succinate (TOPROL-XL) 50 MG 24 hr tablet Take 1 tablet  (50 mg total) by mouth daily. Take with or immediately following a meal. 05/12/19   Bonnell Public, MD  OXYGEN Inhale 2 L into the lungs continuous.    [provider]  predniSONE (DELTASONE) 10 MG tablet Prednisone 30 mg po daily for 3 days, then 20 mg po daily for 3 days and then 10 mg po daily for 3 days and stop 05/11/19   Dana Allan I, MD  SEROQUEL 200 MG tablet Take 200 mg by mouth at bedtime. 04/05/19   [provider]  tiotropium (SPIRIVA HANDIHALER) 18 MCG inhalation capsule Place 1 capsule (18 mcg total) into inhaler and inhale daily. 05/11/19 05/10/20  Bonnell Public, MD    Family History Family History  Problem Relation Age of Onset   Emphysema Father     Social History Social History   Tobacco Use   Smoking status: Current Every Day Smoker    Years: 35.00    Types: Cigarettes   Smokeless tobacco: Never Used   Tobacco comment: 3-4 cigarettes per day  Substance Use Topics  Alcohol use: Never    Frequency: Never    Comment: states has quit drinking "a few months ago"   Drug use: Never     Allergies   Citalopram hydrobromide and Morphine and related   Review of Systems Review of Systems  Constitutional: Negative for chills and fever.  HENT: Negative for sore throat.   Eyes: Negative for redness.  Respiratory: Positive for cough and shortness of breath.   Cardiovascular: Negative for leg swelling.  Gastrointestinal: Negative for abdominal pain, nausea and vomiting.  Endocrine: Negative for polyuria.  Genitourinary: Negative for dysuria and flank pain.  Musculoskeletal: Negative for back pain and neck pain.  Skin: Negative for rash.  Neurological: Negative for headaches.  Hematological: Does not bruise/bleed easily.  Psychiatric/Behavioral: Negative for confusion.     Physical Exam Updated Vital Signs BP (!) 156/116 (BP Location: Left Arm)    Pulse (!) 122    Temp 98.3 F (36.8 C) (Oral)    Resp 20    SpO2 99%    Physical Exam Vitals signs and nursing note reviewed.  Constitutional:      General: She is in acute distress.     Appearance: Normal appearance.  HENT:     Head: Atraumatic.     Nose: Nose normal.     Mouth/Throat:     Mouth: Mucous membranes are moist.  Eyes:     General: No scleral icterus.    Conjunctiva/sclera: Conjunctivae normal.  Neck:     Musculoskeletal: Normal range of motion and neck supple. No neck rigidity or muscular tenderness.     Trachea: No tracheal deviation.  Cardiovascular:     Rate and Rhythm: Regular rhythm. Tachycardia present.     Pulses: Normal pulses.     Heart sounds: Normal heart sounds. No murmur. No friction rub. No gallop.   Pulmonary:     Effort: Respiratory distress present.     Breath sounds: Wheezing present.  Abdominal:     General: Bowel sounds are normal. There is no distension.     Palpations: Abdomen is soft.     Tenderness: There is no abdominal tenderness. There is no guarding.  Genitourinary:    Comments: No cva tenderness.  Musculoskeletal:        General: No swelling or tenderness.  Skin:    General: Skin is warm and dry.     Findings: No rash.  Neurological:     Mental Status: She is alert.     Comments: Alert, speech normal.   Psychiatric:        Mood and Affect: Mood normal.      ED Treatments / Results  Labs (all labs ordered are listed, but only abnormal results are displayed) Results for orders placed or performed during the hospital encounter of 06/16/19  CBC  Result Value Ref Range   WBC 13.9 (H) 4.0 - 10.5 K/uL   RBC 2.73 (L) 3.87 - 5.11 MIL/uL   Hemoglobin 9.1 (L) 12.0 - 15.0 g/dL   HCT 29.7 (L) 36.0 - 46.0 %   MCV 108.8 (H) 80.0 - 100.0 fL   MCH 33.3 26.0 - 34.0 pg   MCHC 30.6 30.0 - 36.0 g/dL   RDW 18.8 (H) 11.5 - 15.5 %   Platelets 249 150 - 400 K/uL   nRBC 0.2 0.0 - 0.2 %  Comprehensive metabolic panel  Result Value Ref Range   Sodium 133 (L) 135 - 145 mmol/L   Potassium 4.4 3.5 - 5.1 mmol/L  Chloride 92 (L) 98 - 111 mmol/L   CO2 21 (L) 22 - 32 mmol/L   Glucose, Bld 178 (H) 70 - 99 mg/dL   BUN 50 (H) 8 - 23 mg/dL   Creatinine, Ser 8.66 (H) 0.44 - 1.00 mg/dL   Calcium 9.8 8.9 - 10.3 mg/dL   Total Protein 6.6 6.5 - 8.1 g/dL   Albumin 3.7 3.5 - 5.0 g/dL   AST 18 15 - 41 U/L   ALT 11 0 - 44 U/L   Alkaline Phosphatase 70 38 - 126 U/L   Total Bilirubin 0.4 0.3 - 1.2 mg/dL   GFR calc non Af Amer 4 (L) >60 mL/min   GFR calc Af Amer 5 (L) >60 mL/min   Anion gap 20 (H) 5 - 15  Troponin I (High Sensitivity)  Result Value Ref Range   Troponin I (High Sensitivity) 81 (H) <18 ng/L   Dg Chest Port 1 View  Result Date: 06/16/2019 CLINICAL DATA:  Shortness of breath EXAM: PORTABLE CHEST 1 VIEW COMPARISON:  May 09, 2019 FINDINGS: There is scarring on the left with postoperative change and volume loss. There is ill-defined patchy opacity in the right lower lobe, felt to represent a combination of scarring and patchy pneumonia. Lungs elsewhere clear. Heart is upper normal in size with mild distortion of pulmonary vascularity on the left, stable pulmonary vascularity on the right within normal limits. No adenopathy. There is aortic atherosclerosis. There is calcification in each carotid and subclavian artery. No bone lesions. IMPRESSION: 1. Ill-defined opacity right base, felt to represent combination of scarring and pneumonia. 2.  Postoperative change on the left with volume loss and scarring. 3.  Stable cardiac silhouette. 4. Aortic Atherosclerosis (ICD10-I70.0). Calcification is noted in each carotid and proximal subclavian artery. Electronically Signed   By: Lowella Grip III M.D.   On: 06/16/2019 17:08    EKG EKG Interpretation  Date/Time:  Friday June 16 2019 16:26:23 EDT Ventricular Rate:  121 PR Interval:    QRS Duration: 104 QT Interval:  345 QTC Calculation: 490 R Axis:   74 Text Interpretation:  Sinus tachycardia RSR' in V1 or V2, right VCD or RVH Repol abnrm suggests  ischemia, lateral leads Nonspecific ST abnormality No significant change since last tracing Confirmed by Lajean Saver [phone]) on 06/16/2019 4:31:11 PM   Radiology Dg Chest Port 1 View  Result Date: 06/16/2019 CLINICAL DATA:  Shortness of breath EXAM: PORTABLE CHEST 1 VIEW COMPARISON:  May 09, 2019 FINDINGS: There is scarring on the left with postoperative change and volume loss. There is ill-defined patchy opacity in the right lower lobe, felt to represent a combination of scarring and patchy pneumonia. Lungs elsewhere clear. Heart is upper normal in size with mild distortion of pulmonary vascularity on the left, stable pulmonary vascularity on the right within normal limits. No adenopathy. There is aortic atherosclerosis. There is calcification in each carotid and subclavian artery. No bone lesions. IMPRESSION: 1. Ill-defined opacity right base, felt to represent combination of scarring and pneumonia. 2.  Postoperative change on the left with volume loss and scarring. 3.  Stable cardiac silhouette. 4. Aortic Atherosclerosis (ICD10-I70.0). Calcification is noted in each carotid and proximal subclavian artery. Electronically Signed   By: Lowella Grip III M.D.   On: 06/16/2019 17:08    Procedures Procedures (including critical care time)  Medications Ordered in ED Medications  0.9 %  sodium chloride infusion (has no administration in time range)  methylPREDNISolone sodium succinate (SOLU-MEDROL) 125 mg/2 mL injection  125 mg (has no administration in time range)  albuterol (VENTOLIN HFA) 108 (90 Base) MCG/ACT inhaler 6 puff (has no administration in time range)  ipratropium (ATROVENT HFA) inhaler 2 puff (has no administration in time range)     Initial Impression / Assessment and Plan / ED Course  I have reviewed the triage vital signs and the nursing notes.  Pertinent labs & imaging results that were available during my care of the patient were reviewed by me and considered in my medical  decision making (see chart for details).  Iv ns. Continuous pulse ox and monitor. o2 [place].   Stat pcxr, ecg, and labs.   Albuterol mdi treatments. atrovent mdi treatments.   Respiratory therapy consulted - additional nebs, titration o2n .  Solumedrol iv.   Reviewed nursing notes and prior charts for additional history.   Labs reviewed by me - wbc elev 13. CRF.   CXR reviewed by me - right lower pna.   Iv antibiotics given for possible pna.   Wheezing improved w mdi treatments/albuterol/atrovent. Improved air exchange as compared to initial   Hr improved from initial. Slowly w treatments resp status improved from prior.   Medicine service consulted for admission.  CRITICAL CARE  RE: acute on chronic respiratory failure w hypoxia, copd exacerbation, rll pna, esrd/hd.   Performed by: Mirna Mires Total critical care time: 100 minutes Critical care time was exclusive of separately billable procedures and treating other patients. Critical care was necessary to treat or prevent imminent or life-threatening deterioration. Critical care was time spent personally by me on the following activities: development of treatment plan with patient and/or surrogate as well as nursing, discussions with consultants, evaluation of patient's response to treatment, examination of patient, obtaining history from patient or surrogate, ordering and performing treatments and interventions, ordering and review of laboratory studies, ordering and review of radiographic studies, pulse oximetry and re-evaluation of patient's condition.  Patient missed her dialysis today - goes M/W/F, so last had dialysis 2 days ago. Nephrology consulted to arrange inpatient dialysis/consult on patient.    Final Clinical Impressions(s) / ED Diagnoses   Final diagnoses:  None    ED Discharge Orders    None           Lajean Saver, MD 06/16/19 1757

## 2019-06-16 NOTE — ED Triage Notes (Signed)
Per [facility], pt from home w/ a c/o resp distress for ~5 hrs. She was given 5 mg Albuterol and then placed on CPAP. She was on CPAP during transport and then she requested to have it taken off. Reports feeling better after tx. Rales and exp. Wheezing noted by Ems.   TX:  CPAP 0.3 mg epi 1:1 5 mg Albuterol 0.5 mg Atrovent 2 g Mag 125 Solumedrol

## 2019-06-16 NOTE — Progress Notes (Signed)
ANTICOAGULATION CONSULT NOTE - Initial Consult  Pharmacy Consult for heparin  Indication: chest pain/ACS  Allergies  Allergen Reactions  . Citalopram Hydrobromide Hives  . Morphine And Related Itching    Patient Measurements: Height: 5\' 4"  (162.6 cm) Weight: 159 lb 6.3 oz (72.3 kg) IBW/kg (Calculated) : 54.7 Heparin Dosing Weight: 70kg  Vital Signs: Temp: 97.7 F (36.5 C) (07/31 2016) Temp Source: Oral (07/31 2016) BP: 119/71 (07/31 2016) Pulse Rate: 102 (07/31 2041)  Labs: Recent Labs    06/16/19 1629 06/16/19 1829  HGB 9.1*  --   HCT 29.7*  --   PLT 249  --   CREATININE 8.66*  --   TROPONINIHS 81* 132*    Estimated Creatinine Clearance: 6.1 mL/min (A) (by C-G formula based on SCr of 8.66 mg/dL (H)).   Medical History: Past Medical History:  Diagnosis Date  . Alcohol abuse   . Arthralgia   . CAD (coronary artery disease)   . Chronic combined systolic and diastolic HF (heart failure) ([place])   . COPD (chronic obstructive pulmonary disease) ([place])   . Depression   . ESRD (end stage renal disease) ([place])   . HTN (hypertension)   . Hypercholesterolemia    primarily ldl-p and small particles  . Lung cancer [facility])    Patient reports this with surgery.  No details.    [name] RAS (renal artery stenosis) ([place]) 2002   by cath tysinger  . Smoking     Medications:  Medications Prior to Admission  Medication Sig Dispense Refill Last Dose  . buPROPion (WELLBUTRIN SR) 150 MG 12 hr tablet Take 150 mg by mouth 2 (two) times daily.   ask 04/20  . albuterol (PROVENTIL HFA;VENTOLIN HFA) 108 (90 Base) MCG/ACT inhaler Inhale 2 puffs into the lungs every 4 (four) hours as needed for wheezing.      [name] aspirin EC 81 MG tablet Take 81 mg by mouth daily.     [name] atorvastatin (LIPITOR) 40 MG tablet Take 40 mg by mouth daily.   ask  . AURYXIA 1 GM 210 MG(Fe) tablet Take 1 tablet by mouth 3 (three) times daily with meals.     [name] b complex-vitamin c-folic acid (NEPHRO-VITE) 0.8 MG TABS tablet  Take 1 tablet by mouth See admin instructions. Take one tablet by mouth on Sunday, Tuesday, Thursday, Saturday mornings. Take one tablet after dialysis on Monday, Wednesday, Friday     . budesonide-formoterol (SYMBICORT) 160-4.5 MCG/ACT inhaler Inhale 2 puffs into the lungs 2 (two) times daily.     . cinacalcet (SENSIPAR) 30 MG tablet Take 1 tablet (30 mg total) by mouth every Monday, Wednesday, and Friday at 6 PM. 60 tablet 0   . clopidogrel (PLAVIX) 75 MG tablet Take 75 mg by mouth daily.   ask  . diclofenac sodium (VOLTAREN) 1 % GEL Apply 2 g topically 4 (four) times daily. 100 g 0   . ipratropium-albuterol (DUONEB) 0.5-2.5 (3) MG/3ML SOLN Take 3 mLs by nebulization every 6 (six) hours as needed. 360 mL 0   . isosorbide mononitrate (IMDUR) 30 MG 24 hr tablet Take 30 mg by mouth at bedtime.      . lidocaine-prilocaine (EMLA) cream Apply 1 application topically every Monday, Wednesday, and Friday. Prior to dialysis     . metoprolol succinate (TOPROL-XL) 50 MG 24 hr tablet Take 1 tablet (50 mg total) by mouth daily. Take with or immediately following a meal. 30 tablet 0   . OXYGEN Inhale 2 L into the lungs  continuous.     . predniSONE (DELTASONE) 10 MG tablet Prednisone 30 mg po daily for 3 days, then 20 mg po daily for 3 days and then 10 mg po daily for 3 days and stop 18 tablet 8   . SEROQUEL 200 MG tablet Take 200 mg by mouth at bedtime.   ask 04/20  . tiotropium (SPIRIVA HANDIHALER) 18 MCG inhalation capsule Place 1 capsule (18 mcg total) into inhaler and inhale daily. 30 capsule 2    Scheduled:  . [START ON 06/17/2019] aspirin EC  81 mg Oral Daily  . atorvastatin  40 mg Oral Daily  . [START ON 06/19/2019] cinacalcet  30 mg Oral Q M,W,F-1800  . [START ON 06/17/2019] clopidogrel  75 mg Oral Daily  . heparin  5,000 Units Subcutaneous Q8H  . ipratropium-albuterol  3 mL Nebulization Q6H  . isosorbide mononitrate  30 mg Oral QHS  . methylPREDNISolone (SOLU-MEDROL) injection  125 mg Intravenous Once   . [START ON 06/17/2019] metoprolol succinate  50 mg Oral Daily  . [START ON 06/17/2019] predniSONE  40 mg Oral Q breakfast  . QUEtiapine  200 mg Oral QHS    Assessment: 68 yo female her with SOB and noted with history of STEMI with stents. Pharmacy consulted to dose heparin. She is not noted on anticoagulation PTA. -Hg= 9.1, plt= 249   Goal of Therapy:  Heparin level 0.3-0.7 units/ml Monitor platelets by anticoagulation protocol: Yes   Plan:  -Heparin bolus 4000 units IV followed by 950 units/hr  -Heparin level in 8 hours and daily wth CBC daily  Hildred Laser, PharmD Clinical Pharmacist **Pharmacist phone directory can now be found on [email or web address] ([email or web address]).  Listed under [place].

## 2019-06-16 NOTE — Consult Note (Addendum)
[facility] Renal Consultation Note  Requesting MD: Dareen Piano Indication for Consultation:  ESRD  Chief complaint: shortness of breath  HPI:  Kari Robinson is a 68 y.o. female with a history including end-stage renal disease on HD Monday Wednesday Friday, COPD, CAD, HTN, and tobacco abuse who presented to the hospital with shortness of breath.  She states that she called EMS because she could not breathe.  She states that she got her breath a little bit once EMS arrived and administered a nebulizer treatment.  She states that EMS told her that her nebulizer was "hooked up wrong."  And he does not think it was working.  She has been on 4 L of oxygen here and is on 3 L normally at home.  She has been a heavy smoker for several years.  She states that she does not think she has any fluid on.  She missed dialysis earlier today but did attend treatment on Wednesday/7/29.  She has a cough productive of yellow sputum.  Had BIPAP earlier and this is to be reinitiated.  Her dry weight was just raised from 67.5 to 68.5 kg.  On 7/29 she left HD at 70.4 kg (had 0.7 kg removed).  On 7/27 she left at 69.2 kg.  On 7/24 she left at 68.9 kg.  On 7/22 she left at 69.1 kg.  Usually has had 1.1 - 2.3 kg removed recently with 2.3 on higher end.   Spoke again with RN this evening after my exam and the patient is comfortable on the BIPAP and s/p nebulizer treatment and not in acute distress at this time.   SARS coronavirus 2 negative.   PMHx:   Past Medical History:  Diagnosis Date  . Alcohol abuse   . Arthralgia   . CAD (coronary artery disease)   . Chronic combined systolic and diastolic HF (heart failure) ([place])   . COPD (chronic obstructive pulmonary disease) ([place])   . Depression   . ESRD (end stage renal disease) ([place])   . HTN (hypertension)   . Hypercholesterolemia    primarily ldl-p and small particles  . Lung cancer [facility])    Patient reports this with surgery.  No details.    [name] RAS  (renal artery stenosis) ([place]) 2002   by cath tysinger  . Smoking     Past Surgical History:  Procedure Laterality Date  . abdominal aortogram     perclose of the right femoral artery  . AV FISTULA PLACEMENT Right 07/11/2018   Procedure: Right arm ARTERIOVENOUS FISTULA CREATION;  Surgeon: [name], MD;  Location: [place];  Service: Vascular;  Laterality: Right;  . AVF placement Right   . CARDIAC CATHETERIZATION     left heart catheterization.  Coronary cineangiography. Lft ventricular cineangiography.    [name] LUNG SURGERY     due to lung cancer per patient's report  . TUBAL LIGATION      Family Hx:  Family History  Problem Relation Age of Onset  . Emphysema Father     Social History:  reports that she quit smoking about 4 weeks ago. Her smoking use included cigarettes. She quit after 35.00 years of use. She has never used smokeless tobacco. She reports that she does not drink alcohol or use drugs.  Allergies:  Allergies  Allergen Reactions  . Citalopram Hydrobromide Hives  . Morphine And Related Itching    Medications: Prior to Admission medications   Medication Sig Start Date End Date Taking? Authorizing Provider  buPROPion (WELLBUTRIN SR) 150 MG 12 hr tablet Take 150 mg by mouth 2 (two) times daily.   Yes [provider]  albuterol (PROVENTIL HFA;VENTOLIN HFA) 108 (90 Base) MCG/ACT inhaler Inhale 2 puffs into the lungs every 4 (four) hours as needed for wheezing.  11/25/17   [provider]  aspirin EC 81 MG tablet Take 81 mg by mouth daily.    [provider]  atorvastatin (LIPITOR) 40 MG tablet Take 40 mg by mouth daily.    [provider]  AURYXIA 1 GM 210 MG(Fe) tablet Take 1 tablet by mouth 3 (three) times daily with meals. 04/06/19   [provider]  b complex-vitamin c-folic acid (NEPHRO-VITE) 0.8 MG TABS tablet Take 1 tablet by mouth See admin instructions. Take one tablet by mouth on Sunday, Tuesday, Thursday, Saturday  mornings. Take one tablet after dialysis on Monday, Wednesday, Friday 02/06/19   [provider]  budesonide-formoterol (SYMBICORT) 160-4.5 MCG/ACT inhaler Inhale 2 puffs into the lungs 2 (two) times daily.    [provider]  cinacalcet (SENSIPAR) 30 MG tablet Take 1 tablet (30 mg total) by mouth every Monday, Wednesday, and Friday at 6 PM. 05/12/19   Dana Allan I, MD  clopidogrel (PLAVIX) 75 MG tablet Take 75 mg by mouth daily.    [provider]  diclofenac sodium (VOLTAREN) 1 % GEL Apply 2 g topically 4 (four) times daily. 02/27/19   Hedges, Dellis Filbert, PA-C  ipratropium-albuterol (DUONEB) 0.5-2.5 (3) MG/3ML SOLN Take 3 mLs by nebulization every 6 (six) hours as needed. 05/11/19   Bonnell Public, MD  isosorbide mononitrate (IMDUR) 30 MG 24 hr tablet Take 30 mg by mouth at bedtime.     [provider]  lidocaine-prilocaine (EMLA) cream Apply 1 application topically every Monday, Wednesday, and Friday. Prior to dialysis 04/06/19   [provider]  metoprolol succinate (TOPROL-XL) 50 MG 24 hr tablet Take 1 tablet (50 mg total) by mouth daily. Take with or immediately following a meal. 05/12/19   Bonnell Public, MD  OXYGEN Inhale 2 L into the lungs continuous.    [provider]  predniSONE (DELTASONE) 10 MG tablet Prednisone 30 mg po daily for 3 days, then 20 mg po daily for 3 days and then 10 mg po daily for 3 days and stop 05/11/19   Dana Allan I, MD  SEROQUEL 200 MG tablet Take 200 mg by mouth at bedtime. 04/05/19   [provider]  tiotropium (SPIRIVA HANDIHALER) 18 MCG inhalation capsule Place 1 capsule (18 mcg total) into inhaler and inhale daily. 05/11/19 05/10/20  Bonnell Public, MD    I have reviewed the patient's current medications.  Labs:  BMP Latest Ref Rng & Units 06/16/2019 05/10/2019 05/09/2019  Glucose 70 - 99 mg/dL 178(H) 120(H) 112(H)  BUN 8 - 23 mg/dL 50(H) 106(H) 86(H)  Creatinine 0.44 - 1.00 mg/dL  8.66(H) 10.23(H) 8.99(H)  Sodium 135 - 145 mmol/L 133(L) 142 140  Potassium 3.5 - 5.1 mmol/L 4.4 3.9 4.1  Chloride 98 - 111 mmol/L 92(L) 108 108  CO2 22 - 32 mmol/L 21(L) 18(L) 15(L)  Calcium 8.9 - 10.3 mg/dL 9.8 9.0 9.1    ROS:  Pertinent items noted in HPI and remainder of comprehensive ROS otherwise negative.  Physical Exam: Vitals:   06/16/19 2016 06/16/19 2041  BP: 119/71   Pulse: (!) 111 (!) 102  Resp: (!) 23 (!) 25  Temp: 97.7 F (36.5 C)   SpO2: 100% 100%  General: Chronically ill adult female HEENT: Normocephalic atraumatic Eyes: Extraocular movements intact sclera anicteric Neck: Supple trachea midline Heart: Tachycardic Lungs: Wheezing bilaterally; increased work of breathing with prolonged speech - due for a neb  Abdomen: Soft nontender nondistended Extremities: No edema appreciated Skin: No rash on extremities exposed.  Tattoo Neuro: Alert and oriented x3 provides a history and follows commands Access right arm AV fistula with bruit and thrill  Dialysis orders: Monday Wednesday Friday schedule at [facility] Estimated dry weight of 68.5 kg.  Note that this was just raised from 67.5 kg.   Blood flow 350 dialysate flow auto flow 1.5 2K/2 calcium bath Aranesp 80 mcg weekly given on 7/29  Sensipar 30 mg 3 times a week at dialysis Calcitriol 1.25 mcg 3 times a week at dialysis   Assessment/Plan:  # End-stage renal disease - Normally dialysis per Monday Wednesday Friday schedule - Plan for dialysis on 8/1 first shift.  No overt overload on exam and patient is now comfortable on BIPAP and s/p neb. "she is a different person".   - Note that she has been leaving above her dry weight and this was just raised recently to reflect that.  Will follow weights to guide her true EDW  # COPD exacerbation - Her nurse has contacted her primary team - Duo nebs are available and they are about to administer treatment on my exam - On steroids per primary team  # Right lower  lobe PNA - On ceftriaxone and azithro per primary team   # Hypertension - Would continue home regimen  # Anemia  - Secondary to CKD  - Status post aranesp on 7/29  # Secondary hyperparathyroidism - Will continue sensipar and calcitriol here  - Please do not include Sensipar on the patient's home medication list on discharge as she takes this medication at dialysis and we do not want her to inadvertently receive treatment at home as well  Claudia Desanctis 06/16/2019, 9:08 PM

## 2019-06-16 NOTE — ED Notes (Signed)
ED TO INPATIENT HANDOFF REPORT  ED Nurse Name and Phone #:  [phone]  S Name/Age/Gender Kari Robinson 68 y.o. female Room/Bed: 030C/030C  Code Status   Code Status: Full Code  Home/SNF/Other Home Patient oriented to: self, place, time and situation Is this baseline? Yes   Triage Complete: Triage complete  Chief Complaint CPAP,SOB,COPD  Triage Note Per [facility], pt from home w/ a c/o resp distress for ~5 hrs. She was given 5 mg Albuterol and then placed on CPAP. She was on CPAP during transport and then she requested to have it taken off. Reports feeling better after tx. Rales and exp. Wheezing noted by Ems.   TX:  CPAP 0.3 mg epi 1:1 5 mg Albuterol 0.5 mg Atrovent 2 g Mag 125 Solumedrol    Allergies Allergies  Allergen Reactions  . Citalopram Hydrobromide Hives  . Morphine And Related Itching    Level of Care/Admitting Diagnosis ED Disposition    ED Disposition Condition [facility] Area: [place] [[id number]]  Level of Care: Telemetry Medical [104]  Covid Evaluation: Confirmed COVID Negative  Diagnosis: COPD with acute exacerbation [facility]) [[id number]]  Admitting Physician: Aldine Contes [phone]  Attending Physician: Aldine Contes [[id number]]  PT Class (Do Not Modify): Observation [104]  PT Acc Code (Do Not Modify): Observation [10022]       B Medical/Surgery History Past Medical History:  Diagnosis Date  . Alcohol abuse   . Arthralgia   . CAD (coronary artery disease)   . Chronic combined systolic and diastolic HF (heart failure) ([place])   . COPD (chronic obstructive pulmonary disease) ([place])   . Depression   . ESRD (end stage renal disease) ([place])   . HTN (hypertension)   . Hypercholesterolemia    primarily ldl-p and small particles  . Lung cancer [facility])    Patient reports this with surgery.  No details.    [name] RAS (renal artery stenosis) ([place]) 2002   by cath tysinger  . Smoking    Past Surgical History:   Procedure Laterality Date  . abdominal aortogram     perclose of the right femoral artery  . AV FISTULA PLACEMENT Right 07/11/2018   Procedure: Right arm ARTERIOVENOUS FISTULA CREATION;  Surgeon: [name], MD;  Location: [place];  Service: Vascular;  Laterality: Right;  . AVF placement Right   . CARDIAC CATHETERIZATION     left heart catheterization.  Coronary cineangiography. Lft ventricular cineangiography.    [name] LUNG SURGERY     due to lung cancer per patient's report  . TUBAL LIGATION       A IV Location/Drains/Wounds Patient Lines/Drains/Airways Status   Active Line/Drains/Airways    Name:   Placement date:   Placement time:   Site:   Days:   Peripheral IV 06/16/19 Anterior;Left Hand   06/16/19    1627    Hand   less than 1   Fistula / Graft Right Forearm Arteriovenous fistula   07/11/18    0839    Forearm   340   Fistula / Graft Right Forearm Arteriovenous fistula   02/24/19    -    Forearm   112   Incision (Closed) 07/11/18 Arm Right   07/11/18    0839     340          Intake/Output Last 24 hours  Intake/Output Summary (Last 24 hours) at 06/16/2019 1855 Last data filed at 06/16/2019 1853 Gross per 24 hour  Intake 100 ml  Output -  Net 100 ml    Labs/Imaging Results for orders placed or performed during the hospital encounter of 06/16/19 (from the past 48 hour(s))  CBC     Status: Abnormal   Collection Time: 06/16/19  4:29 PM  Result Value Ref Range   WBC 13.9 (H) 4.0 - 10.5 K/uL   RBC 2.73 (L) 3.87 - 5.11 MIL/uL   Hemoglobin 9.1 (L) 12.0 - 15.0 g/dL   HCT 29.7 (L) 36.0 - 46.0 %   MCV 108.8 (H) 80.0 - 100.0 fL   MCH 33.3 26.0 - 34.0 pg   MCHC 30.6 30.0 - 36.0 g/dL   RDW 18.8 (H) 11.5 - 15.5 %   Platelets 249 150 - 400 K/uL   nRBC 0.2 0.0 - 0.2 %    Comment: Performed at [facility], [address], [place], [place] [address]  Comprehensive metabolic panel     Status: Abnormal   Collection Time: 06/16/19  4:29 PM  Result Value Ref Range    Sodium 133 (L) 135 - 145 mmol/L   Potassium 4.4 3.5 - 5.1 mmol/L   Chloride 92 (L) 98 - 111 mmol/L   CO2 21 (L) 22 - 32 mmol/L   Glucose, Bld 178 (H) 70 - 99 mg/dL   BUN 50 (H) 8 - 23 mg/dL   Creatinine, Ser 8.66 (H) 0.44 - 1.00 mg/dL   Calcium 9.8 8.9 - 10.3 mg/dL   Total Protein 6.6 6.5 - 8.1 g/dL   Albumin 3.7 3.5 - 5.0 g/dL   AST 18 15 - 41 U/L   ALT 11 0 - 44 U/L   Alkaline Phosphatase 70 38 - 126 U/L   Total Bilirubin 0.4 0.3 - 1.2 mg/dL   GFR calc non Af Amer 4 (L) >60 mL/min   GFR calc Af Amer 5 (L) >60 mL/min   Anion gap 20 (H) 5 - 15    Comment: Performed at [facility], [place] [address] [place], [place] [address]  Troponin I (High Sensitivity)     Status: Abnormal   Collection Time: 06/16/19  4:29 PM  Result Value Ref Range   Troponin I (High Sensitivity) 81 (H) <18 ng/L    Comment: (NOTE) Elevated high sensitivity troponin I (hsTnI) values and significant  changes across serial measurements may suggest ACS but many other  chronic and acute conditions are known to elevate hsTnI results.  Refer to the "Links" section for chest pain algorithms and additional  guidance. Performed at [facility], [place] [address], [place], [place] [address]   SARS Coronavirus 2 [facility] order, Performed in [facility] hospital lab) Nasopharyngeal Nasopharyngeal Swab     Status: None   Collection Time: 06/16/19  4:30 PM   Specimen: Nasopharyngeal Swab  Result Value Ref Range   SARS Coronavirus 2 NEGATIVE NEGATIVE    Comment: (NOTE) If result is NEGATIVE SARS-CoV-2 target nucleic acids are NOT DETECTED. The SARS-CoV-2 RNA is generally detectable in upper and lower  respiratory specimens during the acute phase of infection. The lowest  concentration of SARS-CoV-2 viral copies this assay can detect is 250  copies / mL. A negative result does not preclude SARS-CoV-2 infection  and should not be used as the sole basis for treatment or other  patient management decisions.  A  negative result may occur with  improper specimen collection / handling, submission of specimen other  than nasopharyngeal swab, presence of viral mutation(s) within the  areas targeted by this assay, and inadequate number of viral copies  (<250 copies / mL). A negative result must be combined with clinical  observations, patient history, and epidemiological information. If result is POSITIVE SARS-CoV-2 target nucleic acids are DETECTED. The SARS-CoV-2 RNA is generally detectable in upper and lower  respiratory specimens dur ing the acute phase of infection.  Positive  results are indicative of active infection with SARS-CoV-2.  Clinical  correlation with patient history and other diagnostic information is  necessary to determine patient infection status.  Positive results do  not rule out bacterial infection or co-infection with other viruses. If result is PRESUMPTIVE POSTIVE SARS-CoV-2 nucleic acids MAY BE PRESENT.   A presumptive positive result was obtained on the submitted specimen  and confirmed on repeat testing.  While 2019 novel coronavirus  (SARS-CoV-2) nucleic acids may be present in the submitted sample  additional confirmatory testing may be necessary for epidemiological  and / or clinical management purposes  to differentiate between  SARS-CoV-2 and other Sarbecovirus currently known to infect humans.  If clinically indicated additional testing with an alternate test  methodology [phone]) is advised. The SARS-CoV-2 RNA is generally  detectable in upper and lower respiratory sp ecimens during the acute  phase of infection. The expected result is Negative. Fact Sheet for Patients:  StrictlyIdeas.no Fact Sheet for Healthcare Providers: BankingDealers.co.za This test is not yet approved or cleared by the [place] FDA and has been authorized for detection and/or diagnosis of SARS-CoV-2 by FDA under an Emergency Use  Authorization (EUA).  This EUA will remain in effect (meaning this test can be used) for the duration of the COVID-19 declaration under Section 564(b)(1) of the Act, 21 U.S.C. section 360bbb-3(b)(1), unless the authorization is terminated or revoked sooner. Performed at [facility], [place] [address], [place], [place] [address]    Dg Chest Port 1 View  Result Date: 06/16/2019 CLINICAL DATA:  Shortness of breath EXAM: PORTABLE CHEST 1 VIEW COMPARISON:  May 09, 2019 FINDINGS: There is scarring on the left with postoperative change and volume loss. There is ill-defined patchy opacity in the right lower lobe, felt to represent a combination of scarring and patchy pneumonia. Lungs elsewhere clear. Heart is upper normal in size with mild distortion of pulmonary vascularity on the left, stable pulmonary vascularity on the right within normal limits. No adenopathy. There is aortic atherosclerosis. There is calcification in each carotid and subclavian artery. No bone lesions. IMPRESSION: 1. Ill-defined opacity right base, felt to represent combination of scarring and pneumonia. 2.  Postoperative change on the left with volume loss and scarring. 3.  Stable cardiac silhouette. 4. Aortic Atherosclerosis (ICD10-I70.0). Calcification is noted in each carotid and proximal subclavian artery. Electronically Signed   By: Lowella Grip III M.D.   On: 06/16/2019 17:08    Pending Labs Unresulted Labs (From admission, onward)    Start     Ordered   06/16/19 1842  HIV antibody  Once,   STAT     06/16/19 1843          Vitals/Pain Today's Vitals   06/16/19 1745 06/16/19 1800 06/16/19 1812 06/16/19 1830  BP: (!) 151/94 (!) 146/82  139/73  Pulse: (!) 112 (!) 109  (!) 106  Resp: 20 20  20   Temp:      TempSrc:      SpO2: 100% 98%  99%  PainSc:   5      Isolation Precautions No active isolations  Medications  Medications  0.9 %  sodium chloride infusion ( Intravenous New Bag/Given 06/16/19 1710)   methylPREDNISolone sodium succinate (SOLU-MEDROL) 125 mg/2 mL injection 125 mg (125 mg Intravenous Not Given 06/16/19 1638)  cefTRIAXone (ROCEPHIN) 2 g in sodium chloride 0.9 % 100 mL IVPB (0 g Intravenous Stopped 06/16/19 1853)  azithromycin (ZITHROMAX) 500 mg in sodium chloride 0.9 % 250 mL IVPB (has no administration in time range)  heparin injection 5,000 Units (has no administration in time range)  predniSONE (DELTASONE) tablet 40 mg (has no administration in time range)  ipratropium-albuterol (DUONEB) 0.5-2.5 (3) MG/3ML nebulizer solution 3 mL (has no administration in time range)  aspirin EC tablet 81 mg (has no administration in time range)  clopidogrel (PLAVIX) tablet 75 mg (has no administration in time range)  isosorbide mononitrate (IMDUR) 24 hr tablet 30 mg (has no administration in time range)  metoprolol succinate (TOPROL-XL) 24 hr tablet 50 mg (has no administration in time range)  atorvastatin (LIPITOR) tablet 40 mg (has no administration in time range)  cinacalcet (SENSIPAR) tablet 30 mg (has no administration in time range)  QUEtiapine (SEROQUEL) tablet 200 mg (has no administration in time range)  albuterol (VENTOLIN HFA) 108 (90 Base) MCG/ACT inhaler 6 puff (6 puffs Inhalation Given 06/16/19 1711)  albuterol (VENTOLIN HFA) 108 (90 Base) MCG/ACT inhaler 4 puff (4 puffs Inhalation Given 06/16/19 1809)    Mobility walks with device High fall risk   Focused Assessments    R Recommendations: See Admitting Provider Note  Report given to:   Additional Notes:  She's a dialysis pt. Goes MWF. She missed today's tx.

## 2019-06-16 NOTE — H&P (Signed)
Date: 06/16/2019               Patient Name:  Kari Robinson MRN: [id number]  DOB: 10/17/1951 Age / Sex: 67 y.o., female   PCP: Tsosie Billing, MD (Inactive)         Medical Service: Internal Medicine Teaching Service         Attending Physician: Dr. Aldine Contes, MD    First Contact: Dr. Ronnald Ramp Pager: [phone]  Second Contact: Dr. Shan Levans Pager: [phone]       After Hours (After 5p/  First Contact Pager: [phone]  weekends / holidays): Second Contact Pager: [phone]   Chief Complaint: SOB  History of Present Illness: Kari Robinson is a 68 year old female with a history of COPD on 2L O2 at home, 200 pack year smoking hx, multiple hospitalizations due to COPD exacerbations, L lower lung lobectomy due to cancer in 2017, ESRD on HD (MWF), afib w/ RVR, HFrEF (40% on echo 02/25/2019), HTN, and HLD who presented with increased dyspnea and productive coughing over the past couple days. She says her symptoms started suddenly and have been persistent with minimal improvement using her home inhalers (Albuterol, Symbicort, Duoneb, Spiriva). The patient says she felt like she was suffocating when she exerted herself or would lay down flat. She was unable to sleep at all last night. She was scheduled for dialysis this morning, however she missed her appointment because she didn't feel like she could go through with it because her SOB. She called the ambulance this afternoon to take her to the hospital. In the ambulance the patient received a breathing treatment and BiPAP which she said helped tremendously. The patient says she has had fluid overload in the past which required diuresis because of heart failure, but this doesn't feel like that right now.   She says that she has had increased coughing in addition to the shortness of breath.  Her cough produces yellow sputum. She does endorse pointed chest pain in the center of her chest with deep inspiration but does not radiate. She also noted  recent "black tarry" stools. She has denied any fevers, vision changes, headache, nausea, vomiting, abdominal pain, or recent changes in her bowel and bladder habits.   Of note, patient was recently hospitalized a month ago for COPD exacerbation.  She was given breathing treatments and steroid taper.   In the ED, CBC, CMP, and troponins were drawn. She had a leukocytosis of 13.9, hemoglobin 9.1, mild hyponatremia at 133 and an elevated BUN and creatinine to 50 and 8.66 respectively and mild elevation of troponin at 81. CXR was notable for ill-defined opacity at the right base felt to represent a combination of scarring and pneumonia. EKG did not show signs of ischemic change. She was started on breathing treatments, IVF, Solumedrol 125 mg, and started on Ceftriaxone and azithromycin.   Meds: No outpatient medications have been marked as taking for the 06/16/19 encounter [facility] Encounter).   Allergies: Allergies as of 06/16/2019 - Review Complete 06/16/2019  Allergen Reaction Noted  . Citalopram hydrobromide Hives   . Morphine and related Itching 09/30/2018   Past Medical History:  Diagnosis Date  . Alcohol abuse   . Arthralgia   . CAD (coronary artery disease)   . Chronic combined systolic and diastolic HF (heart failure) ([place])   . COPD (chronic obstructive pulmonary disease) ([place])   . Depression   . ESRD (end stage renal disease) ([place])   . HTN (hypertension)   .  Hypercholesterolemia    primarily ldl-p and small particles  . Lung cancer [facility])    Patient reports this with surgery.  No details.    [name] RAS (renal artery stenosis) ([place]) 2002   by cath tysinger  . Smoking    Family History:   Family History  Problem Relation Age of Onset  . Emphysema Father    Social History:  Smoked 3-4 packs of cigarettes for 60 years (200 pack years) Denies alcohol or recreational drug use. Used to smoke a lot of marijuana when she was younger.  Lives with boyfriend at apartment   Review of  Systems: A complete ROS was negative except as per HPI.   Physical Exam: Blood pressure (!) 146/82, pulse (!) 109, temperature 98.3 F (36.8 C), temperature source Oral, resp. rate 20, SpO2 98 %.  Physical Exam Vitals signs reviewed.  Constitutional:      General: She is not in acute distress.    Appearance: She is well-developed and normal weight. She is ill-appearing. She is not toxic-appearing.  HENT:     Head: Normocephalic and atraumatic.     Mouth/Throat:     Mouth: Mucous membranes are moist.     Pharynx: No oropharyngeal exudate or posterior oropharyngeal erythema.  Eyes:     General: No scleral icterus.       Right eye: No discharge.        Left eye: No discharge.     Extraocular Movements: Extraocular movements intact.  Cardiovascular:     Rate and Rhythm: Tachycardia present. Rhythm irregular.     Pulses: Normal pulses.     Heart sounds: Normal heart sounds. No murmur. No friction rub. No gallop.   Pulmonary:     Effort: Respiratory distress present.     Breath sounds: Decreased air movement present. Decreased breath sounds and wheezing present. No rales.  Neurological:     Mental Status: She is alert.    EKG: IMPRESSION: Sinus tachycardia RSR' in V1 or V2, right VCD or RVH Repol abnrm suggests ischemia, lateral leads Nonspecific ST abnormality No significant change since last tracing  CXR:  IMPRESSION: 1. Ill-defined opacity right base, felt to represent combination of scarring and pneumonia. 2.  Postoperative change on the left with volume loss and scarring. 3.  Stable cardiac silhouette. 4. Aortic Atherosclerosis (ICD10-I70.0). Calcification is noted in each carotid and proximal subclavian artery.  Assessment & Plan by Problem: Active Problems:   * No active hospital problems. *  In summary, Kari Robinson is a 68 year old female with a history of COPD, 200 pack year smoking hx, multiple hospitalizations due to COPD exacerbations, L lower lung lobectomy  due to cancer in 2017, ESRD on HD (MWF), HTN, HLD, STEMI w/ stent placement on 2 separate occasions, Afib w/ RVR, HFrEF (40% on echo 02/25/2019) who presented with acute SOB that improved with breathing treatments in the ED. Her presentation is most consistent with a COPD exacerbation, especially given that she has had multiple hospitalizations due to this in the past and is responded well with breathing treatments upon admission. Given the right lower lung opacity, pneumonia is also suspected. Heart failure exacerbation is also on the differential, however the patient does not feel like she is fluid overloaded and appears to be euvolemic on exam.  #SOB #COPD Exacerbation: The patient has been hospitalized multiple times in the past for COPD exacerbations.  She says that this experience feels exactly like the previous ones.  She is on continuous 2L O2  at home.  She responded well to the breathing treatments given in the ED as well.  Received one-time dose of Solu-Medrol 125 mg in ED. - Prednisone tablet 40 mg daily for 5 days starting tomorrow - Duoneb (ipratropium-albuterol) q6hr  #Leukocytosis #RLL Pneumonia: Leukocytosis to 13.9 on admission with right lower lung opacity. - IVF NS 10-91mL/hr continuous  - Continue Ceftriaxone and Azithromycin for 5 days total (1/5)   #HFrEF (EF 40-45% on echo on 02/25/2019) #Afib w/ RVR #STEMI w/ stents x2: Patient states she had 2 stents placed on different occasions in her heart but does not remember exactly when.  Patient tachycardic to 111 on admission.  Will need to continue watching her heart rate to ensure adequate rate control. Troponin rose from 82 to 132. There doesn't appear to be signs of ischemia on initial EKG. The patient has sharp, pointed, centrally located chest pain upon deep inspiration, but does not have any radiating pain. Cardiology was called and gave recommendations, will start anticoagulation with heparin and obtain echocardiogram. Given her  history of end-stage renal disease, this is likely due to demand ischemia.  - Echocardiogram ordered  - Trend troponins q2hrs - Metoprolol 50mg  daily  - Plavix 75mg  daily - Aspirin 81 mg   #Hx Lung Cancer - s/p LLL lobectomy in 2017  #ESRD (HD MWF): Patient missed hemodialysis session today. - Continue HD MWF  - Continue home Sensipar   #HTN - Imdur 40mg  qhs   #HLD - Lipitor 40mg  daily  #Insomnia - Continue home Seroquel 200mg  qhs   #DVT Prophylaxis: Subq heparin 5000 U q8hr  #FEN/GI: Heart healthy diet  Dispo: Admit patient to Inpatient with expected length of stay greater than 2 midnights.  Signed: Earlene Plater, MD Internal Medicine, PGY1 Pager: [phone]  06/16/2019,7:06 PM

## 2019-06-17 ENCOUNTER — Observation Stay (HOSPITAL_COMMUNITY): Payer: Medicare Other

## 2019-06-17 DIAGNOSIS — I340 Nonrheumatic mitral (valve) insufficiency: Secondary | ICD-10-CM | POA: Diagnosis not present

## 2019-06-17 DIAGNOSIS — I25118 Atherosclerotic heart disease of native coronary artery with other forms of angina pectoris: Secondary | ICD-10-CM | POA: Diagnosis present

## 2019-06-17 DIAGNOSIS — F329 Major depressive disorder, single episode, unspecified: Secondary | ICD-10-CM | POA: Diagnosis present

## 2019-06-17 DIAGNOSIS — J181 Lobar pneumonia, unspecified organism: Secondary | ICD-10-CM

## 2019-06-17 DIAGNOSIS — Z992 Dependence on renal dialysis: Secondary | ICD-10-CM | POA: Diagnosis not present

## 2019-06-17 DIAGNOSIS — I493 Ventricular premature depolarization: Secondary | ICD-10-CM | POA: Diagnosis present

## 2019-06-17 DIAGNOSIS — J189 Pneumonia, unspecified organism: Secondary | ICD-10-CM | POA: Diagnosis present

## 2019-06-17 DIAGNOSIS — I361 Nonrheumatic tricuspid (valve) insufficiency: Secondary | ICD-10-CM | POA: Diagnosis not present

## 2019-06-17 DIAGNOSIS — I4891 Unspecified atrial fibrillation: Secondary | ICD-10-CM

## 2019-06-17 DIAGNOSIS — G4700 Insomnia, unspecified: Secondary | ICD-10-CM | POA: Diagnosis present

## 2019-06-17 DIAGNOSIS — I083 Combined rheumatic disorders of mitral, aortic and tricuspid valves: Secondary | ICD-10-CM | POA: Diagnosis present

## 2019-06-17 DIAGNOSIS — N186 End stage renal disease: Secondary | ICD-10-CM

## 2019-06-17 DIAGNOSIS — E785 Hyperlipidemia, unspecified: Secondary | ICD-10-CM

## 2019-06-17 DIAGNOSIS — R7989 Other specified abnormal findings of blood chemistry: Secondary | ICD-10-CM

## 2019-06-17 DIAGNOSIS — J441 Chronic obstructive pulmonary disease with (acute) exacerbation: Secondary | ICD-10-CM | POA: Diagnosis present

## 2019-06-17 DIAGNOSIS — I252 Old myocardial infarction: Secondary | ICD-10-CM

## 2019-06-17 DIAGNOSIS — I5022 Chronic systolic (congestive) heart failure: Secondary | ICD-10-CM

## 2019-06-17 DIAGNOSIS — R0602 Shortness of breath: Secondary | ICD-10-CM | POA: Diagnosis present

## 2019-06-17 DIAGNOSIS — M4800 Spinal stenosis, site unspecified: Secondary | ICD-10-CM | POA: Diagnosis present

## 2019-06-17 DIAGNOSIS — N2581 Secondary hyperparathyroidism of renal origin: Secondary | ICD-10-CM | POA: Diagnosis present

## 2019-06-17 DIAGNOSIS — Z87891 Personal history of nicotine dependence: Secondary | ICD-10-CM

## 2019-06-17 DIAGNOSIS — Z902 Acquired absence of lung [part of]: Secondary | ICD-10-CM

## 2019-06-17 DIAGNOSIS — R569 Unspecified convulsions: Secondary | ICD-10-CM | POA: Diagnosis present

## 2019-06-17 DIAGNOSIS — R0789 Other chest pain: Secondary | ICD-10-CM | POA: Diagnosis not present

## 2019-06-17 DIAGNOSIS — I132 Hypertensive heart and chronic kidney disease with heart failure and with stage 5 chronic kidney disease, or end stage renal disease: Secondary | ICD-10-CM

## 2019-06-17 DIAGNOSIS — Z95820 Peripheral vascular angioplasty status with implants and grafts: Secondary | ICD-10-CM

## 2019-06-17 DIAGNOSIS — D631 Anemia in chronic kidney disease: Secondary | ICD-10-CM | POA: Diagnosis present

## 2019-06-17 DIAGNOSIS — I701 Atherosclerosis of renal artery: Secondary | ICD-10-CM | POA: Diagnosis present

## 2019-06-17 DIAGNOSIS — I25119 Atherosclerotic heart disease of native coronary artery with unspecified angina pectoris: Secondary | ICD-10-CM | POA: Diagnosis not present

## 2019-06-17 DIAGNOSIS — J440 Chronic obstructive pulmonary disease with acute lower respiratory infection: Secondary | ICD-10-CM | POA: Diagnosis present

## 2019-06-17 DIAGNOSIS — Z9981 Dependence on supplemental oxygen: Secondary | ICD-10-CM | POA: Diagnosis not present

## 2019-06-17 DIAGNOSIS — I255 Ischemic cardiomyopathy: Secondary | ICD-10-CM | POA: Diagnosis present

## 2019-06-17 DIAGNOSIS — J9611 Chronic respiratory failure with hypoxia: Secondary | ICD-10-CM | POA: Diagnosis present

## 2019-06-17 DIAGNOSIS — E871 Hypo-osmolality and hyponatremia: Secondary | ICD-10-CM | POA: Diagnosis present

## 2019-06-17 DIAGNOSIS — Z85118 Personal history of other malignant neoplasm of bronchus and lung: Secondary | ICD-10-CM

## 2019-06-17 DIAGNOSIS — I480 Paroxysmal atrial fibrillation: Secondary | ICD-10-CM | POA: Diagnosis present

## 2019-06-17 DIAGNOSIS — I5042 Chronic combined systolic (congestive) and diastolic (congestive) heart failure: Secondary | ICD-10-CM | POA: Diagnosis present

## 2019-06-17 DIAGNOSIS — D539 Nutritional anemia, unspecified: Secondary | ICD-10-CM

## 2019-06-17 DIAGNOSIS — Z20828 Contact with and (suspected) exposure to other viral communicable diseases: Secondary | ICD-10-CM | POA: Diagnosis present

## 2019-06-17 DIAGNOSIS — Z79899 Other long term (current) drug therapy: Secondary | ICD-10-CM

## 2019-06-17 LAB — FERRITIN: Ferritin: 1208 ng/mL — ABNORMAL HIGH (ref 11–307)

## 2019-06-17 LAB — CBC
HCT: 26.9 % — ABNORMAL LOW (ref 36.0–46.0)
Hemoglobin: 8.6 g/dL — ABNORMAL LOW (ref 12.0–15.0)
MCH: 33.7 pg (ref 26.0–34.0)
MCHC: 32 g/dL (ref 30.0–36.0)
MCV: 105.5 fL — ABNORMAL HIGH (ref 80.0–100.0)
Platelets: 173 10*3/uL (ref 150–400)
RBC: 2.55 MIL/uL — ABNORMAL LOW (ref 3.87–5.11)
RDW: 18.6 % — ABNORMAL HIGH (ref 11.5–15.5)
WBC: 6 10*3/uL (ref 4.0–10.5)
nRBC: 0.3 % — ABNORMAL HIGH (ref 0.0–0.2)

## 2019-06-17 LAB — HIV ANTIBODY (ROUTINE TESTING W REFLEX): HIV Screen 4th Generation wRfx: NONREACTIVE

## 2019-06-17 LAB — BASIC METABOLIC PANEL
Anion gap: 19 — ABNORMAL HIGH (ref 5–15)
BUN: 59 mg/dL — ABNORMAL HIGH (ref 8–23)
CO2: 20 mmol/L — ABNORMAL LOW (ref 22–32)
Calcium: 9.4 mg/dL (ref 8.9–10.3)
Chloride: 94 mmol/L — ABNORMAL LOW (ref 98–111)
Creatinine, Ser: 9.34 mg/dL — ABNORMAL HIGH (ref 0.44–1.00)
GFR calc Af Amer: 4 mL/min — ABNORMAL LOW (ref >60)
GFR calc non Af Amer: 4 mL/min — ABNORMAL LOW (ref >60)
Glucose, Bld: 143 mg/dL — ABNORMAL HIGH (ref 70–99)
Potassium: 4.9 mmol/L (ref 3.5–5.1)
Sodium: 133 mmol/L — ABNORMAL LOW (ref 135–145)

## 2019-06-17 LAB — ECHOCARDIOGRAM COMPLETE
Height: 64 in
Weight: 2518.54 oz

## 2019-06-17 LAB — TROPONIN I (HIGH SENSITIVITY)
Troponin I (High Sensitivity): 315 ng/L (ref <18)
Troponin I (High Sensitivity): 323 ng/L (ref <18)
Troponin I (High Sensitivity): 248 ng/L (ref <18)

## 2019-06-17 LAB — HEPARIN LEVEL (UNFRACTIONATED)
Heparin Unfractionated: 0.44 IU/mL (ref 0.30–0.70)
Heparin Unfractionated: >2.2 IU/mL — ABNORMAL HIGH (ref 0.30–0.70)

## 2019-06-17 LAB — VITAMIN B12: Vitamin B-12: 445 pg/mL (ref 180–914)

## 2019-06-17 LAB — IRON AND TIBC
Iron: 70 ug/dL (ref 28–170)
Saturation Ratios: 26 % (ref 10.4–31.8)
TIBC: 265 ug/dL (ref 250–450)
UIBC: 195 ug/dL

## 2019-06-17 LAB — BRAIN NATRIURETIC PEPTIDE: B Natriuretic Peptide: >4500 pg/mL — ABNORMAL HIGH (ref 0.0–100.0)

## 2019-06-17 MED ORDER — ALBUTEROL SULFATE (2.5 MG/3ML) 0.083% IN NEBU
INHALATION_SOLUTION | RESPIRATORY_TRACT | Status: AC
  Filled 2019-06-17: qty 3

## 2019-06-17 MED ORDER — HEPARIN SODIUM (PORCINE) 5000 UNIT/ML IJ SOLN
5000.0000 [IU] | Freq: Three times a day (TID) | INTRAMUSCULAR | Status: DC
  Administered 2019-06-17 – 2019-06-20 (×8): 5000 [IU] via SUBCUTANEOUS
  Filled 2019-06-17 (×8): qty 1

## 2019-06-17 MED ORDER — IPRATROPIUM-ALBUTEROL 0.5-2.5 (3) MG/3ML IN SOLN
RESPIRATORY_TRACT | Status: AC
  Filled 2019-06-17: qty 3

## 2019-06-17 NOTE — Progress Notes (Signed)
[place] for heparin  Indication: chest pain/ACS  Allergies  Allergen Reactions  . Citalopram Hydrobromide Hives  . Morphine And Related Itching    Patient Measurements: Height: 5\' 4"  (162.6 cm) Weight: 157 lb 6.5 oz (71.4 kg) IBW/kg (Calculated) : 54.7 Heparin Dosing Weight: 70kg  Vital Signs: Temp: 97.7 F (36.5 C) (08/01 0715) Temp Source: Oral (08/01 0715) BP: (P) 130/64 (08/01 0800) Pulse Rate: (P) 107 (08/01 0800)  Labs: Recent Labs    06/16/19 1629 06/16/19 1829 06/16/19 2256 06/17/19 0318 06/17/19 0616  HGB 9.1*  --   --  8.6*  --   HCT 29.7*  --   --  26.9*  --   PLT 249  --   --  173  --   HEPARINUNFRC  --   --   --   --  0.44  CREATININE 8.66*  --   --  9.34*  --   TROPONINIHS 81* 132* 235* 315*  --     Estimated Creatinine Clearance: 5.6 mL/min (A) (by C-G formula based on SCr of 9.34 mg/dL (H)).   Medical History: Past Medical History:  Diagnosis Date  . Alcohol abuse   . Arthralgia   . CAD (coronary artery disease)   . Chronic combined systolic and diastolic HF (heart failure) ([place])   . COPD (chronic obstructive pulmonary disease) ([place])   . Depression   . ESRD (end stage renal disease) ([place])   . HTN (hypertension)   . Hypercholesterolemia    primarily ldl-p and small particles  . Lung cancer [facility])    Patient reports this with surgery.  No details.    [name] RAS (renal artery stenosis) ([place]) 2002   by cath tysinger  . Smoking     Medications:  Medications Prior to Admission  Medication Sig Dispense Refill Last Dose  . buPROPion (WELLBUTRIN SR) 150 MG 12 hr tablet Take 150 mg by mouth 2 (two) times daily.   ask 04/20  . albuterol (PROVENTIL HFA;VENTOLIN HFA) 108 (90 Base) MCG/ACT inhaler Inhale 2 puffs into the lungs every 4 (four) hours as needed for wheezing.      [name] aspirin EC 81 MG tablet Take 81 mg by mouth daily.     [name] atorvastatin (LIPITOR) 40 MG tablet Take 40 mg by mouth daily.   ask  . AURYXIA  1 GM 210 MG(Fe) tablet Take 1 tablet by mouth 3 (three) times daily with meals.     [name] b complex-vitamin c-folic acid (NEPHRO-VITE) 0.8 MG TABS tablet Take 1 tablet by mouth See admin instructions. Take one tablet by mouth on Sunday, Tuesday, Thursday, Saturday mornings. Take one tablet after dialysis on Monday, Wednesday, Friday     . budesonide-formoterol (SYMBICORT) 160-4.5 MCG/ACT inhaler Inhale 2 puffs into the lungs 2 (two) times daily.     . cinacalcet (SENSIPAR) 30 MG tablet Take 1 tablet (30 mg total) by mouth every Monday, Wednesday, and Friday at 6 PM. 60 tablet 0   . clopidogrel (PLAVIX) 75 MG tablet Take 75 mg by mouth daily.   ask  . diclofenac sodium (VOLTAREN) 1 % GEL Apply 2 g topically 4 (four) times daily. 100 g 0   . ipratropium-albuterol (DUONEB) 0.5-2.5 (3) MG/3ML SOLN Take 3 mLs by nebulization every 6 (six) hours as needed. 360 mL 0   . isosorbide mononitrate (IMDUR) 30 MG 24 hr tablet Take 30 mg by mouth at bedtime.      . lidocaine-prilocaine (EMLA) cream  Apply 1 application topically every Monday, Wednesday, and Friday. Prior to dialysis     . metoprolol succinate (TOPROL-XL) 50 MG 24 hr tablet Take 1 tablet (50 mg total) by mouth daily. Take with or immediately following a meal. 30 tablet 0   . OXYGEN Inhale 2 L into the lungs continuous.     . predniSONE (DELTASONE) 10 MG tablet Prednisone 30 mg po daily for 3 days, then 20 mg po daily for 3 days and then 10 mg po daily for 3 days and stop 18 tablet 8   . SEROQUEL 200 MG tablet Take 200 mg by mouth at bedtime.   ask 04/20  . tiotropium (SPIRIVA HANDIHALER) 18 MCG inhalation capsule Place 1 capsule (18 mcg total) into inhaler and inhale daily. 30 capsule 2    Scheduled:  . aspirin EC  81 mg Oral Daily  . atorvastatin  40 mg Oral Daily  . [START ON 06/19/2019] calcitRIOL  1.25 mcg Oral Q M,W,F-1800  . Chlorhexidine Gluconate Cloth  6 each Topical Q0600  . [START ON 06/19/2019] cinacalcet  30 mg Oral Q M,W,F-1800  .  clopidogrel  75 mg Oral Daily  . ipratropium-albuterol  3 mL Nebulization Q4H  . isosorbide mononitrate  30 mg Oral QHS  . methylPREDNISolone (SOLU-MEDROL) injection  125 mg Intravenous Once  . metoprolol succinate  50 mg Oral Daily  . predniSONE  40 mg Oral Q breakfast  . QUEtiapine  200 mg Oral QHS    Assessment: 68 yo female her with SOB and noted with history of STEMI with stents.  Pharmacy consulted to dose heparin. She is not noted on anticoagulation PTA.  Initial heparin level at goal (0.44) prior to start HD this morning. Hgb down slightly to 8.6. No bleeding issues noted.    Goal of Therapy:  Heparin level 0.3-0.7 units/ml Monitor platelets by anticoagulation protocol: Yes   Plan:  -Heparin bolus 950 units/hr  -Check confirmatory heparin level this afternoon.   Erin Hearing PharmD., BCPS Clinical Pharmacist 06/17/2019 8:42 AM

## 2019-06-17 NOTE — Progress Notes (Addendum)
Dr. Gilberto Robinson and Dr. Maudie Robinson interviewed the patient bedside at 0000 after noting her troponin I increase from 132->235. Kari Robinson was asleep at bedside and was easy to wake. She has no complaints of chest pain or diaphoresis. She states that she feels comfortable with the BIPAP.   During her physical exam it was noted that she had expiratory wheezes bilaterally with prolonged exhalation. Her heart sounds were normal with no murmurs, rubs or gallops.   After the physical exams we told the patient if she had any new onset of symptoms (chest pain, excessive sweating etc...) she should immediately tell her nurse for a reevaluation. We consulted with Cardiology. They recommended keeping the patient on her heparin and ASA. We appreciate their recommendations.

## 2019-06-17 NOTE — Progress Notes (Signed)
Echocardiogram 2D Echocardiogram has been performed.  Oneal Deputy Tippi Mccrae 06/17/2019, 12:38 PM

## 2019-06-17 NOTE — Plan of Care (Signed)
Patient is progressing to meet care plan goals.

## 2019-06-17 NOTE — Consult Note (Addendum)
Cardiology Consultation:   Patient ID: Kari Robinson MRN: [id number]; DOB: [birth year]  Admit date: 06/16/2019 Date of Consult: 06/17/2019  Primary Care Provider: Tsosie Billing, MD (Inactive) Primary Cardiologist: [facility]- Dr. Louie Casa Primary Electrophysiologist:  None    Patient Profile:   Kari Robinson is a 68 y.o. female with a hx of CAD, ICM, COPD, ERSD on HD, PAF  who is being seen today for the evaluation of shortness of breath and elevated troponin at the request of Dr. Dareen Piano.  History of Present Illness:   Kari Robinson is a 68 yo female with PMH noted above. Reports hx of prior MI and received 3 stents in [place] back in 2005 and then in 2016 with a NSTEMI that was managed medically. In 2012 had a right renal artery stent placed with Dr. Burt Knack. EF by echo back in 11/18 was 25-30%, with follow up nuc study in 2019 showing it had normalized. Echo back in 7/19 at [facility] showed LVEF of 40-45%. She was seen by Dr. Harl Bowie back April of this year. Presented with an episode of unresponsiveness that was called a cardiac arrest but clinical scenario did not fit this story. It was felt this episode was related to a seizure. Admitted back in May with leg weakness and spinal stenosis but opted for no interventional therapy.   Seen again on 05/10/19 by Dr. Percival Spanish when she presented with a COPD exacerbation. Recently had missed her HD treatment. Had a slightly elevated troponin. Had an HD treatment and developed Afib RVR with this. Placed on IV heparin and Dilt. Was in Afib and converted after about 3 hours. Given it was a short duration it was decided not to place on Coumadin at that time.  Placed on BB with IV dilt stopped. Elevated troponin was felt to be demand ischemia.   She presented on 5\21/20 with progressive dyspnea and coughing.  Stated her symptoms started on Thursday and had been persistent with minimal improvement with using her home inhalers.  Stated she felt like she  was significantly more short of breath when she exerted herself or would lie flat.  Was unable to sleep the night prior secondary to orthopnea and PND.  She was scheduled for dialysis yesterday morning but missed this as she felt she could not sit through her treatment with her shortness of breath.  She called EMS yesterday afternoon to bring her to the hospital when symptoms did not resolve.  Initially required breathing treatment and BiPAP which helped significantly.  She reported having brief episodes of chest soreness, felt it was difficult to swallow at times. In regards to her prior CAD, this was unlike prior symptoms. With prior stents, reported the sensation of an elephant on her chest.   In the ED her labs showed sodium 133, potassium 4.4, creatinine 8.6, WBC 13.9, hemoglobin 9.1, HsT 81>> 132>> 235>> 315, BNP greater than 4,500.  Chest x-ray showed scarring and pneumonia.  COVID negative.  EKG showed sinus tachycardia with ST depression in inferior lateral leads with slight elevation in aVR, LVH.  This does appear similar to prior EKGs.  She was started on steroids and antibiotics per primary.  Admitted for further work-up.  Given her rising high-sensitivity troponin cardiology was consulted. This morning around 2am she had the bipap removed. During this time she had another episode of chest soreness similar to what she experienced prior to admission. Has not received any nitro with these episodes.   Heart Pathway Score:  Past Medical History:  Diagnosis Date   Alcohol abuse    Arthralgia    CAD (coronary artery disease)    Chronic combined systolic and diastolic HF (heart failure) ([name])    COPD (chronic obstructive pulmonary disease) ([name])    Depression    ESRD (end stage renal disease) ([name])    HTN (hypertension)    Hypercholesterolemia    primarily ldl-p and small particles   Lung cancer [facility])    Patient reports this with surgery.  No details.     RAS (renal artery  stenosis) ([place]) 2002   by cath tysinger   Smoking     Past Surgical History:  Procedure Laterality Date   abdominal aortogram     perclose of the right femoral artery   AV FISTULA PLACEMENT Right 07/11/2018   Procedure: Right arm ARTERIOVENOUS FISTULA CREATION;  Surgeon: [name], MD;  Location: [place];  Service: Vascular;  Laterality: Right;   AVF placement Right    CARDIAC CATHETERIZATION     left heart catheterization.  Coronary cineangiography. Lft ventricular cineangiography.     LUNG SURGERY     due to lung cancer per patient's report   TUBAL LIGATION       Home Medications:  Prior to Admission medications   Medication Sig Start Date End Date Taking? Authorizing Provider  buPROPion (WELLBUTRIN SR) 150 MG 12 hr tablet Take 150 mg by mouth 2 (two) times daily.   Yes [provider]  albuterol (PROVENTIL HFA;VENTOLIN HFA) 108 (90 Base) MCG/ACT inhaler Inhale 2 puffs into the lungs every 4 (four) hours as needed for wheezing.  11/25/17   [provider]  aspirin EC 81 MG tablet Take 81 mg by mouth daily.    [provider]  atorvastatin (LIPITOR) 40 MG tablet Take 40 mg by mouth daily.    [provider]  AURYXIA 1 GM 210 MG(Fe) tablet Take 1 tablet by mouth 3 (three) times daily with meals. 04/06/19   [provider]  b complex-vitamin c-folic acid (NEPHRO-VITE) 0.8 MG TABS tablet Take 1 tablet by mouth See admin instructions. Take one tablet by mouth on Sunday, Tuesday, Thursday, Saturday mornings. Take one tablet after dialysis on Monday, Wednesday, Friday 02/06/19   [provider]  budesonide-formoterol (SYMBICORT) 160-4.5 MCG/ACT inhaler Inhale 2 puffs into the lungs 2 (two) times daily.    [provider]  cinacalcet (SENSIPAR) 30 MG tablet Take 1 tablet (30 mg total) by mouth every Monday, Wednesday, and Friday at 6 PM. 05/12/19   Dana Allan I, MD  clopidogrel (PLAVIX) 75 MG tablet Take 75 mg by  mouth daily.    [provider]  diclofenac sodium (VOLTAREN) 1 % GEL Apply 2 g topically 4 (four) times daily. 02/27/19   Hedges, Dellis Filbert, PA-C  ipratropium-albuterol (DUONEB) 0.5-2.5 (3) MG/3ML SOLN Take 3 mLs by nebulization every 6 (six) hours as needed. 05/11/19   Bonnell Public, MD  isosorbide mononitrate (IMDUR) 30 MG 24 hr tablet Take 30 mg by mouth at bedtime.     [provider]  lidocaine-prilocaine (EMLA) cream Apply 1 application topically every Monday, Wednesday, and Friday. Prior to dialysis 04/06/19   [provider]  metoprolol succinate (TOPROL-XL) 50 MG 24 hr tablet Take 1 tablet (50 mg total) by mouth daily. Take with or immediately following a meal. 05/12/19   Bonnell Public, MD  OXYGEN Inhale 2 L into the lungs continuous.    [provider]  predniSONE (  DELTASONE) 10 MG tablet Prednisone 30 mg po daily for 3 days, then 20 mg po daily for 3 days and then 10 mg po daily for 3 days and stop 05/11/19   Dana Allan I, MD  SEROQUEL 200 MG tablet Take 200 mg by mouth at bedtime. 04/05/19   [provider]  tiotropium (SPIRIVA HANDIHALER) 18 MCG inhalation capsule Place 1 capsule (18 mcg total) into inhaler and inhale daily. 05/11/19 05/10/20  Bonnell Public, MD    Inpatient Medications: Scheduled Meds:  aspirin EC  81 mg Oral Daily   atorvastatin  40 mg Oral Daily   [START ON 06/19/2019] calcitRIOL  1.25 mcg Oral Q M,W,F-1800   Chlorhexidine Gluconate Cloth  6 each Topical Q0600   [START ON 06/19/2019] cinacalcet  30 mg Oral Q M,W,F-1800   clopidogrel  75 mg Oral Daily   ipratropium-albuterol  3 mL Nebulization Q4H   isosorbide mononitrate  30 mg Oral QHS   methylPREDNISolone (SOLU-MEDROL) injection  125 mg Intravenous Once   metoprolol succinate  50 mg Oral Daily   predniSONE  40 mg Oral Q breakfast   QUEtiapine  200 mg Oral QHS   Continuous Infusions:  sodium chloride Stopped (06/16/19 2110)    azithromycin Stopped (06/16/19 2007)   cefTRIAXone (ROCEPHIN)  IV Stopped (06/16/19 1853)   heparin 950 Units/hr (06/17/19 0400)   PRN Meds:   Allergies:    Allergies  Allergen Reactions   Citalopram Hydrobromide Hives   Morphine And Related Itching    Social History:   Social History   Socioeconomic History   Marital status: Single    Spouse name: Not on file   Number of children: Not on file   Years of education: Not on file   Highest education level: Not on file  Occupational History   Not on file  Social Needs   Financial resource strain: Not on file   Food insecurity    Worry: Not on file    Inability: Not on file   Transportation needs    Medical: Not on file    Non-medical: Not on file  Tobacco Use   Smoking status: Former Smoker    Years: 35.00    Types: Cigarettes    Quit date: 05/16/2019    Years since quitting: 0.0   Smokeless tobacco: Never Used   Tobacco comment: 3-4 cigarettes per day  Substance and Sexual Activity   Alcohol use: Never    Frequency: Never    Comment: states has quit drinking "a few months ago"   Drug use: Never   Sexual activity: Not on file  Lifestyle   Physical activity    Days per week: Not on file    Minutes per session: Not on file   Stress: Not on file  Relationships   Social connections    Talks on phone: Not on file    Gets together: Not on file    Attends religious service: Not on file    Active member of club or organization: Not on file    Attends meetings of clubs or organizations: Not on file    Relationship status: Not on file   Intimate partner violence    Fear of current or ex partner: Not on file    Emotionally abused: Not on file    Physically abused: Not on file    Forced sexual activity: Not on file  Other Topics Concern   Not on file  Social History Narrative   **  Merged History Encounter **        Family History:    Family History  Problem Relation Age of Onset    Emphysema Father      ROS:  Please see the history of present illness.   All other ROS reviewed and negative.     Physical Exam/Data:   Vitals:   06/17/19 0400 06/17/19 0715 06/17/19 0728 06/17/19 0733  BP:  135/77 127/70 132/76  Pulse: 98 100 (!) 102 (!) 104  Resp: (!) 22 (!) 24 19 18   Temp:  97.7 F (36.5 C)    TempSrc:  Oral    SpO2:  99% 99% 100%  Weight:  71.4 kg    Height:        Intake/Output Summary (Last 24 hours) at 06/17/2019 0815 Last data filed at 06/17/2019 0400 Gross per 24 hour  Intake 158.71 ml  Output --  Net 158.71 ml   Last 3 Weights 06/17/2019 06/17/2019 06/16/2019  Weight (lbs) 157 lb 6.5 oz 158 lb 15.2 oz 159 lb 6.3 oz  Weight (kg) 71.4 kg 72.1 kg 72.3 kg  Some encounter information is confidential and restricted. Go to Review Flowsheets activity to see all data.     Body mass index is 27.02 kg/m.  General:  Well nourished, well developed, in no acute distress. Undergoing HD.  HEENT: normal Lymph: no adenopathy Neck: no JVD Vascular: No carotid bruits Cardiac:  normal S1, S2; tachy; no murmur  Lungs:  clear to auscultation bilaterally, no wheezing, rhonchi or rales  Abd: soft, nontender, no hepatomegaly  Ext: no edema Musculoskeletal:  No deformities, BUE and BLE strength normal and equal Skin: warm and dry  Neuro:  CNs 2-12 intact, no focal abnormalities noted Psych:  Normal affect   EKG:  The EKG was personally reviewed and demonstrates: ST with ST depression in inferolateral leads, with slight elevation in aVR  Relevant CV Studies:  TTE: 02/25/19  IMPRESSIONS    1. The left ventricle has mild-moderately reduced systolic function, with an ejection fraction of 40-45%. The cavity size was mildly dilated. There is moderate concentric left ventricular hypertrophy. Left ventricular diastolic Doppler parameters are  indeterminate. Indeterminate filling pressures.  2. The right ventricle has normal systolic function. The cavity was normal. There  is no increase in right ventricular wall thickness. Right ventricular systolic pressure could not be assessed.  3. Left atrial size was mildly dilated.  4. When compared to the prior study: Side by side comparison of images to 06/07/18 performed. No significant change in LV function, no change in valve function.  Laboratory Data:  High Sensitivity Troponin:   Recent Labs  Lab 06/16/19 1629 06/16/19 1829 06/16/19 2256 06/17/19 0318  TROPONINIHS 81* 132* 235* 315*     Cardiac EnzymesNo results for input(s): TROPONINI in the last 168 hours. No results for input(s): TROPIPOC in the last 168 hours.  Chemistry Recent Labs  Lab 06/16/19 1629 06/17/19 0318  NA 133* 133*  K 4.4 4.9  CL 92* 94*  CO2 21* 20*  GLUCOSE 178* 143*  BUN 50* 59*  CREATININE 8.66* 9.34*  CALCIUM 9.8 9.4  GFRNONAA 4* 4*  GFRAA 5* 4*  ANIONGAP 20* 19*    Recent Labs  Lab 06/16/19 1629  PROT 6.6  ALBUMIN 3.7  AST 18  ALT 11  ALKPHOS 70  BILITOT 0.4   Hematology Recent Labs  Lab 06/16/19 1629 06/17/19 0318  WBC 13.9* 6.0  RBC 2.73* 2.55*  HGB 9.1* 8.6*  HCT 29.7* 26.9*  MCV 108.8* 105.5*  MCH 33.3 33.7  MCHC 30.6 32.0  RDW 18.8* 18.6*  PLT 249 173   BNP Recent Labs  Lab 06/17/19 0318  BNP >4,500.0*    DDimer No results for input(s): DDIMER in the last 168 hours.   Radiology/Studies:  Dg Chest Port 1 View  Result Date: 06/16/2019 CLINICAL DATA:  Shortness of breath EXAM: PORTABLE CHEST 1 VIEW COMPARISON:  May 09, 2019 FINDINGS: There is scarring on the left with postoperative change and volume loss. There is ill-defined patchy opacity in the right lower lobe, felt to represent a combination of scarring and patchy pneumonia. Lungs elsewhere clear. Heart is upper normal in size with mild distortion of pulmonary vascularity on the left, stable pulmonary vascularity on the right within normal limits. No adenopathy. There is aortic atherosclerosis. There is calcification in each carotid and  subclavian artery. No bone lesions. IMPRESSION: 1. Ill-defined opacity right base, felt to represent combination of scarring and pneumonia. 2.  Postoperative change on the left with volume loss and scarring. 3.  Stable cardiac silhouette. 4. Aortic Atherosclerosis (ICD10-I70.0). Calcification is noted in each carotid and proximal subclavian artery. Electronically Signed   By: Lowella Grip III M.D.   On: 06/16/2019 17:08    Assessment and Plan:   Kari Robinson is a 68 y.o. female with a hx of CAD, ICM, COPD, ERSD on HD, PAF  who is being seen today for the evaluation of shortness of breath and elevated troponin at the request of Dr. Dareen Piano.  1. Elevated Troponin: symptoms are atypical. Feels like chest soreness at times. Mostly complains of shortness of breath, though this could be her anginal equivalent. EKG on admission showed ST with ST depression in inferolateral leads with slight elevation in aVR. Slightly improved on repeat. HsT up to 315 at last check this morning. Could be demand ischemia. Currently on IV heparin. She reports not having had a cath since back in 2005. Certainly could be progression of disease.  -- will continue on IV heparin. Review with MD but would consider further work up with coronary angiography this admission. Will need to sort out these episodes of tarry stools and anemia. She is plan free at this time.   2. Dyspnea: suspect multifactorial with PNA and CHF 2/2 to missed HD session. Feeling much better and weaned from Bipap to [place] currently. Antibiotics per primary along with steroids  3. Acute on chronic combined HF: Echo back in 4/20 showed EF of 40-45%, no change when compared to prior back in 7/19. Missed HD on Friday. BNP >4500. Volume management per nephrology.   4. ESRD on HD: undergoing HD session this morning.   5. Hx of lung Ca: s/p LLL lobectomy in 2017  6. Anemia: reports tarry stools prior to admission. Baseline Hgb around 10>>8.6. Work up per  primary.   For questions or updates, please contact [place] Please consult [email or web address] for contact info under   Signed, Reino Bellis, NP  06/17/2019 8:15 AM   Attending Note:   The patient was seen and examined.  Agree with assessment and plan as noted above.  Changes made to the above note as needed.  Patient seen and independently examined with Reino Bellis, NP .   We discussed all aspects of the encounter. I agree with the assessment and plan as stated above.  1.   Chest pain / dyspnea.  :   By history, her CP is pleuretic.   Definitely  worse with inspiration and associated with yellow green sputum and worsening dyspnea.   AP CXR in the ER yesterday is c/w pneumonia.    I think these symptoms are c/w a COPD exacerbation +/- pneumonia.      He symptoms are not similar to her previous NSTEMI symptoms in 2016,  Troponin elevations are c/w demand ischemia in the setting of ESRD.    She has known CAD and it would be reasonable to proceed with lexiscan myoview at some point once she is better . Would not refer for cath yet as she has recent black tarry stools and dropped her Hb from 10 to 8.6 in 5 weeks.   Will need further evaluation of this prior to any cath / stenting .  2.  Anemia :   Likely due to slow GI bleed.   Hx of tarry stools with a recent drop of her Hb since June 25.   3.  COPD :   Has stopped smoking as of last month  4.  ESRD:   Further management per Dr . Royce Macadamia ( nephrology )    I have spent a total of 40 minutes with patient reviewing hospital  notes , telemetry, EKGs, labs and examining patient as well as establishing an assessment and plan that was discussed with the patient. > 50% of time was spent in direct patient care.     Thayer Headings, Brooke Bonito., MD, [facility] 06/17/2019, 9:48 AM [address]  [place] Pager [phone]

## 2019-06-17 NOTE — Progress Notes (Signed)
PT Cancellation Note  Patient Details Name: Kari Robinson MRN: [id number] DOB: 1951/02/13   Cancelled Treatment:    Reason Eval/Treat Not Completed: Medical issues which prohibited therapy(pt on bipap, elevated troponin and await cardiology)   Sandy Salaam Olina Melfi 06/17/2019, 7:04 AM  Elwyn Reach, PT Acute Rehabilitation Services Pager: [phone] Office: [phone]

## 2019-06-17 NOTE — Progress Notes (Signed)
Approximated at around 0958am HD tx terminated with an hr remaining  As requested by pt . Pt stated take me off, Im not feeling well. Pt  In no apparent distress. VSS. Early termination ofHD against AMA  Signed. Nephrologist made aware of above. Report given to primary nurse.

## 2019-06-17 NOTE — Progress Notes (Signed)
Subjective: Pt's troponin's uptrending overnight. Cards consulted and recommended beginning heparin drip. Pt seen this morning at dialysis. Denies chest pain at this time. Stated she felt short of breath last evening, but felt somewhat improved this morning.  Objective:  Vital signs in last 24 hours: Vitals:   06/17/19 0341 06/17/19 0343 06/17/19 0351 06/17/19 0400  BP:   136/79   Pulse:  98 97 98  Resp:  (!) 26 (!) 27 (!) 22  Temp:   97.6 F (36.4 C)   TempSrc:   Axillary   SpO2: 100% 100% 100%   Weight:   72.1 kg   Height:       Physical Exam Vitals signs reviewed.  Constitutional:      General: She is not in acute distress. HENT:     Head: Normocephalic and atraumatic.  Cardiovascular:     Rate and Rhythm: Tachycardia present. Rhythm irregular.     Heart sounds: Normal heart sounds. No murmur. No friction rub. No gallop.   Pulmonary:     Effort: Pulmonary effort is normal. No tachypnea or respiratory distress.     Breath sounds: Decreased air movement present. Wheezing present. No rhonchi or rales.     Comments: Currently on 4L O2 [place] Abdominal:     General: Bowel sounds are normal. There is no distension.     Palpations: Abdomen is soft.     Tenderness: There is no abdominal tenderness.  Skin:    General: Skin is warm and dry.  Neurological:     Mental Status: She is alert.  Psychiatric:        Mood and Affect: Mood is anxious.    Assessment/Plan:  Assessment - Pt is a 68 year old F with history of COPD with multiple prior hospitalizations for exacerbations, 200 pack year smoking history, L lower lung lobectomy due to cancer in 2017, ESRD on HD (MWF), HTN, HLD, STEMI w/ stent placement x2, Afib w/ RVR, HFrEF (EF 40% on 02/25/19) who presented for shortness of breath, most likely due to COPD exacerbation.   1. COPD Exacerbation likely secondary to CAP - Pt feels shortness of breath improved this morning. Normally on 2L O2 at home, and currently on 4L O2 with O2  saturation 98%. Expiratory wheezes throughout, but not in respiratory distress. Previously received one dose solumedrol 125mg  in ED. - continue prednisone tablet 40 mg daily on day 1 of 5  - duonebs (ipratropium-albuterol) q4hr   2. History of STEMI w/ stents x2, now possible NSTEMI - Troponins uptrending overnight from 123 to 235, and 315 this morning. Pt continues to be mildly tachycardic in 100's. No signs of ischemia on EKG and pt denies any chest pain. Troponin elevation still appears to be due to demand ischemia in the setting of COPD exacerbation, CAP, and ESRD. - continue heparin infusion of 950 units/hr and ASA 81mg  daily  - repeat troponins this morning - follow-up echo this morning - continue metoprolol 50mg  daily and plavix 75mg  daily Discussed with cardiology who will follow-up today, appreciate recommendations.   3. RLL Pneumonia - R lower lung opacity seen on CXR, "represent combination of scarring and pneumonia". Leukocytosis to 13.9 on admission now resolved to 6.0 this morning. - IVF NS 10-66mL/hr continuous  - continue ceftriaxone and z\azithromycin for CAP coverage - on day 2 of 5     4. ESRD on HD (MWF) - Pt missed dialysis yesterday, and got dialysis this morning. Some shortness of breath may be related  to increased volume from missing dialysis yesterday. - continue HD MWF - continue home Sensipar 30mg  MWF Nephrology following, appreciate recommendations.  5. Macrocytic anemia - Pt's Hgb today was 8.6. Appears chronically low with baseline 9-10?[name] MCV consistently elevated >101. - ordered B12, folate, ferritin, iron, and TIBC  6. HTN - isosorbide mononitrate 30mg  qhs   7. HLD - atorvastatin 40mg  daily  8. Insomnia - continue home seroquel 200mg  qhs    Diet - Renal Diet DVT ppx - on heparin infusion Fluids - 10-50mL/hr NS CODE STATUS - FULL CODE   Dispo: Anticipated discharge in approximately 2 day(s).   Ladona Horns, MD 06/17/2019, 6:52 AM Pager:  [phone]

## 2019-06-17 NOTE — Progress Notes (Addendum)
Date: 06/17/2019  Patient name: Kari Robinson  Medical record number: [id number]  Date of birth: 11-21-1950   I have seen and evaluated Kari Robinson and discussed their care with the Residency Team.  In brief, patient is a 68 year old female with past medical history of COPD on home oxygen, multiple hospitalizations due to COPD exacerbation, left lower lung lobectomy secondary to cancer in 2017, ESRD on hemodialysis, A. fib with RVR, chronic systolic heart failure with an EF of 40%, hypertension hyperlipidemia who presented to the ED with worsening shortness of breath and coughing over the last couple of days.  Patient states that a couple of days ago she noted sudden onset of shortness of breath with dyspnea on exertion as well as associated productive cough with yellowish phlegm.  Patient states that her shortness of breath is worse with exertion as well as with laying flat.  Patient was scheduled to have hemodialysis done yesterday but missed her appointment secondary to her worsening shortness of breath.  Patient did try to take her home inhalers at home but had minimal improvement in her symptoms.  She came to the ED for further evaluation.  Patient was recently hospitalized 1 month ago for a COPD exacerbation.  No chest pain, no palpitations, no diaphoresis, no lightheadedness, no syncope, no focal weakness, no fevers or chills, no nausea or vomiting, no abdominal pain, no diarrhea, no headache, no blurry vision, no focal weakness.  Patient did complain of an episode of shortness of breath this morning as well but currently able to speak in full sentences with O2 sats in the 90s on room air.  She denies any chest pain currently.  PMHx, Fam Hx, and/or Soc Hx : As per resident admit note  Vitals:   06/17/19 0733 06/17/19 0800  BP: 132/76 (P) 130/64  Pulse: (!) 104 (!) (P) 107  Resp: 18 (P) 20  Temp:    SpO2: 100% (P) 99%   General: Awake, alert, oriented x3, NAD CVS: Regular rate and  rhythm, normal heart sounds Lungs: Bilateral scattered expiratory wheeze noted on exam, no crackles Abdomen: Soft, nontender, nondistended, normoactive bowel sounds Extremities: No edema noted Psych: Appears anxious today and is tearful regarding her current medical condition HEENT: Normocephalic/atraumatic Neuro: Oriented x3, no focal deficits noted  Assessment and Plan: I have seen and evaluated the patient as outlined above. I agree with the formulated Assessment and Plan as detailed in the residents' note, with the following changes:   1.  Acute COPD exacerbation likely secondary to community-acquired pneumonia: -Patient presented to the ED with worsening shortness of breath and productive cough and was found to have a leukocytosis with a white count of 13 as well as a right lower lobe infiltrate and wheezing on exam consistent with an acute COPD exacerbation secondary to likely right lower lobe pneumonia. -Patient received 1 dose of IV methylprednisolone in the ED yesterday -We will continue with prednisone 40 mg daily for 5-day course -Patient's lung exam and shortness of breath appear to be improved today from yesterday.  Patient is currently on room air with O2 sats in the late 90s.  Will monitor closely -Continue duo nebs every 6 hours -Continue ceftriaxone and azithromycin for likely community-acquired pneumonia.  Leukocytosis is now resolved.  We will continue to monitor -No further work-up at this time  2.  Possible NSTEMI: -Patient was noted to have an elevated troponin on admission which continues to trend upwards.  She denies any active chest pain currently. -  EKG was noted to be normal sinus rhythm with occasional PVCs with likely LVH and T wave inversions in lateral leads consistent with possible ischemia -I suspect that the patient's elevated troponins are likely secondary to demand in the setting of acute COPD exacerbation and underlying pneumonia -We will continue with  heparin drip for now -We will trend troponins -We will follow-up 2D echo today -Case is discussed with cardiology by resident yesterday.  Cardiology to follow-up today.  Will await recommendations -Continue metoprolol 50 mg daily as well as aspirin and Plavix  3.  ESRD on hemodialysis: -Nephrology follow-up and recommendations appreciated.  We will continue with hemodialysis per nephrology. -It is possible the patient has a component of fluid overload as well in the setting of missed hemodialysis session which may be contributing to her shortness of breath -Will monitor her closely  Aldine Contes, MD 8/1/20208:20 AM

## 2019-06-17 NOTE — Procedures (Signed)
Seen and examined on dialysis and procedure supervised.  Blood pressure 113/57 and HR 102.  Tolerating goal.  RUE AVF in use. Has had wheezing and thinks she needs a duoneb.  We have contacted respiratory.  Pre HD weight of 71.4 kg charted.  Attempt 3 kg and reassess.   Claudia Desanctis, MD 06/17/2019  8:39 AM

## 2019-06-17 NOTE — Progress Notes (Signed)
[facility] Progress Note  Name: Kari Robinson MRN: [id number] DOB: November 28, 1950  Chief Complaint:  Shortness of breath   Subjective:  Feels much better this morning.  She had BIPAP overnight.  Her shortness of breath has been better and she states "I'm always short of breath."  States that she has nausea with HD recently.  Had a neb about 2.5 - 3 hours ago.   Review of systems:  shortness of breath is better States she had chest discomfort yesterday -gone now No current n/v   -------------------- Background on consult : Kari Robinson is a 68 y.o. female with a history including end-stage renal disease on HD Monday Wednesday Friday, COPD, CAD, HTN, and tobacco abuse who presented to the hospital with shortness of breath.  She states that she called EMS because she could not breathe.  She states that she got her breath a little bit once EMS arrived and administered a nebulizer treatment.  She states that EMS told her that her nebulizer was "hooked up wrong."  And he does not think it was working.  She has been on 4 L of oxygen here and is on 3 L normally at home.  She has been a heavy smoker for several years.  She states that she does not think she has any fluid on.  She missed dialysis earlier today but did attend treatment on Wednesday/7/29.  She has a cough productive of yellow sputum.  Had BIPAP earlier and this is to be reinitiated.  Her dry weight was just raised from 67.5 to 68.5 kg.  On 7/29 she left HD at 70.4 kg (had 0.7 kg removed).  On 7/27 she left at 69.2 kg.  On 7/24 she left at 68.9 kg.  On 7/22 she left at 69.1 kg.  Usually has had 1.1 - 2.3 kg removed recently with 2.3 on higher end.   Spoke again with RN this evening after my exam and the patient is comfortable on the BIPAP and s/p nebulizer treatment and not in acute distress at this time.  SARS coronavirus 2 negative.    Intake/Output Summary (Last 24 hours) at 06/17/2019 0601 Last data filed at 06/17/2019  0400 Gross per 24 hour  Intake 158.71 ml  Output -  Net 158.71 ml    Vitals:  Vitals:   06/17/19 0341 06/17/19 0343 06/17/19 0351 06/17/19 0400  BP:   136/79   Pulse:  98 97 98  Resp:  (!) 26 (!) 27 (!) 22  Temp:   97.6 F (36.4 C)   TempSrc:   Axillary   SpO2: 100% 100% 100%   Weight:   72.1 kg   Height:         Physical Exam:  General adult female in bed in no acute distress at rest HEENT normocephalic atraumatic extraocular movements intact sclera anicteric Neck supple trachea midline Lungs wheezing noted; increased work of breathing with exertion and improved with rest  Heart S1S2; no rub  Abdomen soft nontender nondistended Extremities no pitting edema  Psych normal mood and affect Access: RUE AVF with bruit and thrill   Medications reviewed   Labs:  BMP Latest Ref Rng & Units 06/17/2019 06/16/2019 05/10/2019  Glucose 70 - 99 mg/dL 143(H) 178(H) 120(H)  BUN 8 - 23 mg/dL 59(H) 50(H) 106(H)  Creatinine 0.44 - 1.00 mg/dL 9.34(H) 8.66(H) 10.23(H)  Sodium 135 - 145 mmol/L 133(L) 133(L) 142  Potassium 3.5 - 5.1 mmol/L 4.9 4.4 3.9  Chloride 98 -  111 mmol/L 94(L) 92(L) 108  CO2 22 - 32 mmol/L 20(L) 21(L) 18(L)  Calcium 8.9 - 10.3 mg/dL 9.4 9.8 9.0     Assessment/Plan:   # End-stage renal disease - Normally dialysis per Monday Wednesday Friday schedule - HD today - first shift  - Note that she has been leaving above her dry weight and this was just raised recently to reflect that.  Will follow weights to guide her true EDW  # COPD exacerbation - on nebulizer treatments and steroids per primary team  - she is able to have a neb in the dialysis unit  # Right lower lobe PNA - On ceftriaxone and azithro per primary team   # Hypertension  - acceptable control   # Anemia  - Secondary to CKD  - Status post aranesp on 7/29  # Secondary hyperparathyroidism - Will continue sensipar and calcitriol here  - Please do not include Sensipar or calcitriol on the  patient's home medication list on discharge as she takes this medication at dialysis and we do not want her to inadvertently receive treatment at home as well   Claudia Desanctis, MD 06/17/2019 6:01 AM

## 2019-06-17 NOTE — Progress Notes (Signed)
OT Cancellation Note  Patient Details Name: Kari Robinson MRN: [id number] DOB: 05-31-1951   Cancelled Treatment:    Reason Eval/Treat Not Completed: Patient not medically ready.  Pt with elevated troponins and awaiting cardiology consult, per notes.  Lucille Passy, OTR/L Acute Rehabilitation Services Pager [phone] Office [phone]   Lucille Passy M 06/17/2019, 6:09 AM

## 2019-06-17 NOTE — Progress Notes (Signed)
[place] for heparin  Indication: chest pain/ACS  Allergies  Allergen Reactions  . Citalopram Hydrobromide Hives  . Morphine And Related Itching    Patient Measurements: Height: 5\' 4"  (162.6 cm) Weight: 153 lb 7 oz (69.6 kg) IBW/kg (Calculated) : 54.7 Heparin Dosing Weight: 70kg  Vital Signs: Temp: 98 F (36.7 C) (08/01 1957) Temp Source: Oral (08/01 1957) BP: 136/80 (08/01 1957) Pulse Rate: 63 (08/01 1957)  Labs: Recent Labs    06/16/19 1629  06/17/19 0318 06/17/19 0616 06/17/19 1128 06/17/19 1758 06/17/19 1843  HGB 9.1*  --  8.6*  --   --   --   --   HCT 29.7*  --  26.9*  --   --   --   --   PLT 249  --  173  --   --   --   --   HEPARINUNFRC  --   --   --  0.44  --  >2.20*  --   CREATININE 8.66*  --  9.34*  --   --   --   --   TROPONINIHS 81*   < > 315*  --  323*  --  248*   < > = values in this interval not displayed.    Estimated Creatinine Clearance: 5.5 mL/min (A) (by C-G formula based on SCr of 9.34 mg/dL (H)).   Medical History: Past Medical History:  Diagnosis Date  . Alcohol abuse   . Arthralgia   . CAD (coronary artery disease)   . Chronic combined systolic and diastolic HF (heart failure) ([place])   . COPD (chronic obstructive pulmonary disease) ([place])   . Depression   . ESRD (end stage renal disease) ([place])   . HTN (hypertension)   . Hypercholesterolemia    primarily ldl-p and small particles  . Lung cancer [facility])    Patient reports this with surgery.  No details.    [name] RAS (renal artery stenosis) ([place]) 2002   by cath tysinger  . Smoking     Medications:  Medications Prior to Admission  Medication Sig Dispense Refill Last Dose  . albuterol (PROVENTIL HFA;VENTOLIN HFA) 108 (90 Base) MCG/ACT inhaler Inhale 2 puffs into the lungs every 4 (four) hours as needed for wheezing.    unk  . aspirin EC 81 MG tablet Take 81 mg by mouth daily.   unk  . atorvastatin (LIPITOR) 40 MG tablet Take 40 mg by mouth daily.    unk  . AURYXIA 1 GM 210 MG(Fe) tablet Take 210-420 mg by mouth See admin instructions. Take 2 tablets (420mg ) by mouth three times daily with meals, and 1 tablet (210mg ) by mouth with snacks   unk  . b complex-vitamin c-folic acid (NEPHRO-VITE) 0.8 MG TABS tablet Take 1 tablet by mouth See admin instructions. Take one tablet by mouth on Sunday, Tuesday, Thursday, Saturday mornings. Take one tablet after dialysis on Monday, Wednesday, Friday   unk  . budesonide-formoterol (SYMBICORT) 160-4.5 MCG/ACT inhaler Inhale 2 puffs into the lungs 2 (two) times daily.   unk  . buPROPion (WELLBUTRIN SR) 150 MG 12 hr tablet Take 150 mg by mouth 2 (two) times daily.   unk  . clopidogrel (PLAVIX) 75 MG tablet Take 75 mg by mouth daily.   unk  . ipratropium-albuterol (DUONEB) 0.5-2.5 (3) MG/3ML SOLN Take 3 mLs by nebulization every 6 (six) hours as needed. 360 mL 0 unk  . isosorbide mononitrate (IMDUR) 30 MG 24 hr tablet Take  30 mg by mouth at bedtime.    unk  . lidocaine-prilocaine (EMLA) cream Apply 1 application topically every Monday, Wednesday, and Friday. Prior to dialysis   unk  . metoprolol succinate (TOPROL-XL) 50 MG 24 hr tablet Take 1 tablet (50 mg total) by mouth daily. Take with or immediately following a meal. 30 tablet 0 unk  . OXYGEN Inhale 2 L into the lungs continuous.     . SEROQUEL 200 MG tablet Take 200 mg by mouth at bedtime.   unk  . tiotropium (SPIRIVA HANDIHALER) 18 MCG inhalation capsule Place 1 capsule (18 mcg total) into inhaler and inhale daily. 30 capsule 2 unk  . cinacalcet (SENSIPAR) 30 MG tablet Take 1 tablet (30 mg total) by mouth every Monday, Wednesday, and Friday at 6 PM. 60 tablet 0   . diclofenac sodium (VOLTAREN) 1 % GEL Apply 2 g topically 4 (four) times daily. (Patient not taking: Reported on 06/17/2019) 100 g 0 Not Taking at Unknown time   Scheduled:  . aspirin EC  81 mg Oral Daily  . atorvastatin  40 mg Oral Daily  . [START ON 06/19/2019] calcitRIOL  1.25 mcg Oral Q  M,W,F-1800  . Chlorhexidine Gluconate Cloth  6 each Topical Q0600  . [START ON 06/19/2019] cinacalcet  30 mg Oral Q M,W,F-1800  . clopidogrel  75 mg Oral Daily  . ipratropium-albuterol  3 mL Nebulization Q4H  . isosorbide mononitrate  30 mg Oral QHS  . methylPREDNISolone (SOLU-MEDROL) injection  125 mg Intravenous Once  . metoprolol succinate  50 mg Oral Daily  . predniSONE  40 mg Oral Q breakfast  . QUEtiapine  200 mg Oral QHS    Assessment: 68 yo female her with SOB and noted with history of STEMI with stents.She was on IV heparin and pharmacy consulted to change to VTE prophylaxis dosing. Heparin IV infusion has been discontinued     Goal of Therapy:  Heparin level 0.3-0.7 units/ml Monitor platelets by anticoagulation protocol: Yes   Plan:  -heparin 5000 units sq q8h -Will sign off. Please contact pharmacy with any other needs.  Thank you, Hildred Laser, PharmD Clinical Pharmacist **Pharmacist phone directory can now be found on [email or web address] ([email or web address]).  Listed under [place].

## 2019-06-18 DIAGNOSIS — R0789 Other chest pain: Secondary | ICD-10-CM

## 2019-06-18 LAB — BASIC METABOLIC PANEL
Anion gap: 18 — ABNORMAL HIGH (ref 5–15)
BUN: 47 mg/dL — ABNORMAL HIGH (ref 8–23)
CO2: 23 mmol/L (ref 22–32)
Calcium: 8.7 mg/dL — ABNORMAL LOW (ref 8.9–10.3)
Chloride: 94 mmol/L — ABNORMAL LOW (ref 98–111)
Creatinine, Ser: 6.34 mg/dL — ABNORMAL HIGH (ref 0.44–1.00)
GFR calc Af Amer: 7 mL/min — ABNORMAL LOW (ref >60)
GFR calc non Af Amer: 6 mL/min — ABNORMAL LOW (ref >60)
Glucose, Bld: 100 mg/dL — ABNORMAL HIGH (ref 70–99)
Potassium: 4.3 mmol/L (ref 3.5–5.1)
Sodium: 135 mmol/L (ref 135–145)

## 2019-06-18 LAB — CBC
HCT: 23.5 % — ABNORMAL LOW (ref 36.0–46.0)
Hemoglobin: 7.4 g/dL — ABNORMAL LOW (ref 12.0–15.0)
MCH: 33.6 pg (ref 26.0–34.0)
MCHC: 31.5 g/dL (ref 30.0–36.0)
MCV: 106.8 fL — ABNORMAL HIGH (ref 80.0–100.0)
Platelets: 167 10*3/uL (ref 150–400)
RBC: 2.2 MIL/uL — ABNORMAL LOW (ref 3.87–5.11)
RDW: 19.3 % — ABNORMAL HIGH (ref 11.5–15.5)
WBC: 8 10*3/uL (ref 4.0–10.5)
nRBC: 0.5 % — ABNORMAL HIGH (ref 0.0–0.2)

## 2019-06-18 LAB — GLUCOSE, CAPILLARY: Glucose-Capillary: 141 mg/dL — ABNORMAL HIGH (ref 70–99)

## 2019-06-18 LAB — PREPARE RBC (CROSSMATCH)

## 2019-06-18 LAB — ABO/RH: ABO/RH(D): O POS

## 2019-06-18 LAB — HEPATITIS B SURFACE ANTIGEN: Hepatitis B Surface Ag: NEGATIVE

## 2019-06-18 MED ORDER — SODIUM CHLORIDE 0.9% IV SOLUTION: Freq: Once | INTRAVENOUS | Status: DC

## 2019-06-18 MED ORDER — CALCITRIOL 0.25 MCG PO CAPS
1.2500 ug | ORAL_CAPSULE | ORAL | Status: DC
  Administered 2019-06-19: 1.25 ug via ORAL

## 2019-06-18 MED ORDER — BENZONATATE 100 MG PO CAPS
100.0000 mg | ORAL_CAPSULE | Freq: Three times a day (TID) | ORAL | Status: DC | PRN
  Administered 2019-06-20: 100 mg via ORAL
  Filled 2019-06-18: qty 1

## 2019-06-18 MED ORDER — CHLORHEXIDINE GLUCONATE CLOTH 2 % EX PADS
6.0000 | MEDICATED_PAD | Freq: Every day | CUTANEOUS | Status: DC
  Administered 2019-06-19 – 2019-06-20 (×2): 6 via TOPICAL

## 2019-06-18 NOTE — Evaluation (Signed)
Physical Therapy Evaluation Patient Details Name: Kari Robinson MRN: [id number] DOB: [birth year] Today's Date: 06/18/2019   History of Present Illness  Pt is a 68 yo female admitted 06/17/19 with acute on chronic respiratory failure requiring bipap with elevated troponin. Pt with 4 admissions in 6 months. PMHx: COPD, HTN, HLD, depression, ESRD on MWF.  Clinical Impression  PTA pt utilizing Rollator for short distance ambulation although admits to 4 recent falls. Pt independent in ADLs and boyfriend assists for iADLs. Pt is limited in safe mobility by decreased strength and endurance in presence of DoE and anxiety with SoB. Pt is mod I for bed mobility, supervision for transfers and min guard for ambulation with Rollator. PT recommends HHPT at d/c to improve strength and endurance. PT will continue to follow acutely.     Follow Up Recommendations Home health PT;Supervision for mobility/OOB    Equipment Recommendations  None recommended by PT       Precautions / Restrictions Precautions Precautions: Fall Precaution Comments: 4 recent falls Restrictions Weight Bearing Restrictions: No      Mobility  Bed Mobility Overal bed mobility: Modified Independent             General bed mobility comments: HOB up  Transfers Overall transfer level: Needs assistance Equipment used: 4-wheeled walker Transfers: Sit to/from Stand Sit to Stand: Supervision         General transfer comment: supervision for safety and reminder to lock brakes  Ambulation/Gait Ambulation/Gait assistance: Min guard Gait Distance (Feet): 80 Feet Assistive device: 4-wheeled walker Gait Pattern/deviations: Step-through pattern;Decreased step length - right;Decreased step length - left;Trunk flexed Gait velocity: slowed Gait velocity interpretation: <1.8 ft/sec, indicate of risk for recurrent falls General Gait Details: min guard for safety for slow, steady gait, with flexed forward posture, vc for proximity  to Rollator        Balance Overall balance assessment: Needs assistance   Sitting balance-Leahy Scale: Good     Standing balance support: Bilateral upper extremity supported Standing balance-Leahy Scale: Poor Standing balance comment: reliant on rollator for ambulation                             Pertinent Vitals/Pain Pain Assessment: Faces Pain Score: 10-Worst pain ever Pain Location: chest and stomach Pain Descriptors / Indicators: Sore Pain Intervention(s): Monitored during session    Home Living Family/patient expects to be discharged to:: Private residence Living Arrangements: Spouse/significant other(boyfriend) Available Help at Discharge: Family;Available 24 hours/day Type of Home: Apartment Home Access: Level entry     Home Layout: One level Home Equipment: Clinical cytogeneticist - 4 wheels;Grab bars - tub/shower;Toilet riser(02--3L) Additional Comments: pt mostly stays with his boyfriend who lives one door down    Prior Function Level of Independence: Needs assistance   Gait / Transfers Assistance Needed: ambulates with rollator  ADL's / Homemaking Assistance Needed: pt is independent in self care, boyfriend does housekeeping and meal prep         Hand Dominance   Dominant Hand: Right    Extremity/Trunk Assessment   Upper Extremity Assessment Upper Extremity Assessment: Defer to OT evaluation    Lower Extremity Assessment Lower Extremity Assessment: Generalized weakness       Communication   Communication: No difficulties  Cognition Arousal/Alertness: Awake/alert Behavior During Therapy: Anxious Overall Cognitive Status: Within Functional Limits for tasks assessed  General Comments General comments (skin integrity, edema, etc.): Pt on 4L O2 via [place] and able to maintain SaO2 >93%O2 throughout session, however with return to room pt with 3/4 DoE and became very anxious, stating she  could not breathe, despite good oxygenation, encouraged slow, pursed lipped breathing, pt able to regain deep breathing and decrease anxiety        Assessment/Plan    PT Assessment Patient needs continued PT services  PT Problem List Decreased strength;Decreased activity tolerance;Decreased mobility;Cardiopulmonary status limiting activity       PT Treatment Interventions Gait training;DME instruction;Functional mobility training;Therapeutic activities;Therapeutic exercise;Balance training;Cognitive remediation;Patient/family education    PT Goals (Current goals can be found in the Care Plan section)  Acute Rehab PT Goals Patient Stated Goal: breathe better PT Goal Formulation: With patient Time For Goal Achievement: 07/02/19 Potential to Achieve Goals: Fair    Frequency Min 3X/week   Barriers to discharge        Co-evaluation PT/OT/SLP Co-Evaluation/Treatment: Yes Reason for Co-Treatment: For patient/therapist safety PT goals addressed during session: Mobility/safety with mobility;Balance OT goals addressed during session: ADL's and self-care       AM-PAC PT "6 Clicks" Mobility  Outcome Measure Help needed turning from your back to your side while in a flat bed without using bedrails?: None Help needed moving from lying on your back to sitting on the side of a flat bed without using bedrails?: None Help needed moving to and from a bed to a chair (including a wheelchair)?: A Little Help needed standing up from a chair using your arms (e.g., wheelchair or bedside chair)?: A Little Help needed to walk in hospital room?: A Little Help needed climbing 3-5 steps with a railing? : A Lot 6 Click Score: 19    End of Session Equipment Utilized During Treatment: Gait belt;Oxygen Activity Tolerance: Patient tolerated treatment well Patient left: in chair;with call bell/phone within reach Nurse Communication: Mobility status PT Visit Diagnosis: Other abnormalities of gait and  mobility (R26.89);Muscle weakness (generalized) (M62.81);Difficulty in walking, not elsewhere classified (R26.2);Pain Pain - part of body: (chest )    Time: 6553-7482 PT Time Calculation (min) (ACUTE ONLY): 25 min   Charges:   PT Evaluation $PT Eval Moderate Complexity: 1 Mod          Kari Robinson B. Kari Robinson PT, DPT Acute Rehabilitation Services Pager [phone] Office [phone]   [place] 06/18/2019, 11:49 AM

## 2019-06-18 NOTE — Progress Notes (Signed)
   Subjective: Pt continues with some pleuritic chest pain worse with coughing.  She had trouble tolerating dialysis yesterday became nauseous and developed abd pain.  Overall she feels better, still having cough and dyspnea.    Objective:  Vital signs in last 24 hours: Vitals:   06/18/19 0400 06/18/19 0639 06/18/19 0907 06/18/19 0922  BP:   118/84 120/64  Pulse: 89  88 85  Resp: 18  (!) 23 20  Temp:   98.2 F (36.8 C) 98.5 F (36.9 C)  TempSrc:   Oral Oral  SpO2: 96% 97% 98% 98%  Weight:      Height:       Cardiac: normal rate and rhythm, clear s1 and s2, no murmurs, rubs or gallops, no LE edema Pulmonary: rhonci in RLL, diffuse expiratory wheezing bilaterally Abdominal: non distended abdomen, soft and nontender Psych: Alert, conversant, in good spirits   Assessment/Plan:  Assessment - Pt is a 68 year old F with history of COPD with multiple prior hospitalizations for exacerbations, 200 pack year smoking history, L lower lung lobectomy due to cancer in 2017, ESRD on HD (MWF), HTN, HLD, STEMI w/ stent placement x2, Afib w/ RVR, HFrEF (EF 40% on 02/25/19) who presented for shortness of breath, most likely due to COPD exacerbation.   1. Dyspnea/COPD Exacerbation secondary to CAP - Dyspnea continues to improve, Still profound wheezing but better air movement.  ~ 2L UF removed yesterday before she terminated dialysis treatment    - continue prednisone tablet 40 mg daily  - ceftriaxone/azithro for CAP - duonebs (ipratropium-albuterol) q4hr - add tessalon for cough - UF removal    2. Ischemic Cardiomyopathy - Troponins have now downtrended, ECHO stable. Chest pain pleuritic pt will follow up with cardiologist outpt.  Heparin infusion d/ced 8/1 pm  - continue metoprolol 50mg  daily and plavix 75mg  daily   3. ESRD on HD (MWF) - Pt missed dialysis yesterday, and got dialysis this morning. Some shortness of breath may be related to increased volume from missing dialysis yesterday. -  continue HD MWF - continue home Sensipar 30mg  MWF Nephrology following, appreciate recommendations.  4. Macrocytic anemia - Pt's Hgb today was 8.6. Appears chronically low with baseline 9-10?[name] MCV consistently elevated >101. - ordered B12, folate, ferritin, iron, and TIBC  5. HTN - isosorbide mononitrate 30mg  qhs   6. HLD - atorvastatin 40mg  daily  Dispo: Anticipated discharge in approximately 1 day(s).   Vickki Muff MD PGY-3 Internal Medicine Pager # [phone]

## 2019-06-18 NOTE — Evaluation (Signed)
Occupational Therapy Evaluation Patient Details Name: Kari Robinson MRN: [id number] DOB: September 01, 1951 Today's Date: 06/18/2019    History of Present Illness Pt is a 68 yo female admitted 06/17/19 with acute on chronic respiratory failure requiring bipap with elevated troponin. Pt with 4 admissions in 6 months. PMHx: COPD, HTN, HLD, depression, ESRD on MWF.   Clinical Impression   Pt walks with a rollator and admits to 4 recent falls. She is modified independent in self care and relies on her boyfriend for IADL. Pt presents with anxiety, impaired balance and decreased activity tolerance. She is currently on 4L 02 and working toward return to her typical 3L. Pt requires up to supervision for ADL and ADL transfers. She has been educated in energy conservation during recent hospitalizations. Will follow acutely. Do not anticipate pt will need post acute OT.    Follow Up Recommendations  No OT follow up    Equipment Recommendations  None recommended by OT    Recommendations for Other Services       Precautions / Restrictions Precautions Precautions: Fall Precaution Comments: 4 recent falls Restrictions Weight Bearing Restrictions: No      Mobility Bed Mobility Overal bed mobility: Modified Independent             General bed mobility comments: HOB up  Transfers Overall transfer level: Needs assistance Equipment used: 4-wheeled walker Transfers: Sit to/from Stand Sit to Stand: Supervision         General transfer comment: supervision for safety and reminder to lock brakes    Balance Overall balance assessment: Needs assistance   Sitting balance-Leahy Scale: Good     Standing balance support: Bilateral upper extremity supported Standing balance-Leahy Scale: Poor Standing balance comment: reliant on rollator for ambulation                           ADL either performed or assessed with clinical judgement   ADL Overall ADL's : Needs  assistance/impaired Eating/Feeding: Independent;Sitting   Grooming: Brushing hair;Sitting;Set up   Upper Body Bathing: Set up;Sitting   Lower Body Bathing: Supervison/ safety;Sit to/from stand   Upper Body Dressing : Set up;Sitting   Lower Body Dressing: Supervision/safety;Sit to/from stand   Toilet Transfer: Supervision/safety;Ambulation;RW;BSC   Toileting- Water quality scientist and Hygiene: Supervision/safety;Sit to/from stand       Functional mobility during ADLs: Supervision/safety;Rolling walker General ADL Comments: pt using pursed lip breathing strategies throughout ambulation     Vision Baseline Vision/History: Wears glasses Wears Glasses: At all times Patient Visual Report: No change from baseline       Perception     Praxis      Pertinent Vitals/Pain Pain Assessment: Faces Pain Score: 10-Worst pain ever Pain Location: chest and stomach Pain Descriptors / Indicators: Sore Pain Intervention(s): Monitored during session     Hand Dominance Right   Extremity/Trunk Assessment Upper Extremity Assessment Upper Extremity Assessment: Overall WFL for tasks assessed   Lower Extremity Assessment Lower Extremity Assessment: Defer to PT evaluation       Communication Communication Communication: No difficulties   Cognition Arousal/Alertness: Awake/alert Behavior During Therapy: Anxious Overall Cognitive Status: Within Functional Limits for tasks assessed                                     General Comments       Exercises     Shoulder Instructions  Home Living Family/patient expects to be discharged to:: Private residence Living Arrangements: Spouse/significant other(boyfriend) Available Help at Discharge: Family;Available 24 hours/day Type of Home: Apartment Home Access: Level entry     Home Layout: One level     Bathroom Shower/Tub: Teacher, early years/pre: Standard     Home Equipment: Clinical cytogeneticist - 4  wheels;Grab bars - tub/shower;Toilet riser(02--3L)   Additional Comments: pt mostly stays with his boyfriend who lives one door down      Prior Functioning/Environment Level of Independence: Needs assistance  Gait / Transfers Assistance Needed: ambulates with rollator ADL's / Homemaking Assistance Needed: pt is independent in self care, boyfriend does housekeeping and meal prep             OT Problem List: Decreased activity tolerance;Impaired balance (sitting and/or standing);Decreased knowledge of use of DME or AE;Pain      OT Treatment/Interventions: Self-care/ADL training    OT Goals(Current goals can be found in the care plan section) Acute Rehab OT Goals Patient Stated Goal: breathe better OT Goal Formulation: With patient Time For Goal Achievement: 07/02/19 Potential to Achieve Goals: Good ADL Goals Additional ADL Goal #1: Pt will perform sponge bathing and dressing seated at sink with set up, taking rest breaks as needed.  OT Frequency: Min 2X/week   Barriers to D/C:            Co-evaluation PT/OT/SLP Co-Evaluation/Treatment: Yes Reason for Co-Treatment: For patient/therapist safety   OT goals addressed during session: ADL's and self-care      AM-PAC OT "6 Clicks" Daily Activity     Outcome Measure Help from another person eating meals?: None Help from another person taking care of personal grooming?: None Help from another person toileting, which includes using toliet, bedpan, or urinal?: A Little Help from another person bathing (including washing, rinsing, drying)?: A Little Help from another person to put on and taking off regular upper body clothing?: None Help from another person to put on and taking off regular lower body clothing?: A Little 6 Click Score: 21   End of Session Equipment Utilized During Treatment: Gait belt;Rolling walker;Oxygen(4L) Nurse Communication: Mobility status  Activity Tolerance: Patient tolerated treatment well Patient  left: in chair;with call bell/phone within reach  OT Visit Diagnosis: Other (comment);Other abnormalities of gait and mobility (R26.89)(decreased activity tolerance)                Time: 7654-6503 OT Time Calculation (min): 35 min Charges:  OT General Charges $OT Visit: 1 Visit OT Evaluation $OT Eval Moderate Complexity: 1 Mod  Nestor Lewandowsky, OTR/L Acute Rehabilitation Services Pager: [phone] Office: [phone]  Malka So 06/18/2019, 11:34 AM

## 2019-06-18 NOTE — Progress Notes (Addendum)
[facility] Progress Note  Name: Jacarra Bobak MRN: [id number] DOB: November 16, 1951  Chief Complaint:  Shortness of breath   Subjective:  Had HD on 8/1 (off schedule as missed tx Friday) with 1.8 kg UF and a reported post weight of 69.6 kg.  She had been on heparin gtt and this has been discontinued.  We discussed the risks, benefits, and indications of blood transfusion and she consents to getting blood.  Her oxygen was off on my arrival and she's not sure how long it's been under the covers/off.  Sat's low 90's on arrival on room air.   Review of systems:    States that she always has some trouble breathing but her shortness of breath is better Some chest tightness - overall feels better  No current n/v  Dark stools last on 8/1 here inpatient  -------------------- Background on consult : Charika Mikelson is a 68 y.o. female with a history including end-stage renal disease on HD Monday Wednesday Friday, COPD, CAD, HTN, and tobacco abuse who presented to the hospital with shortness of breath.  She states that she called EMS because she could not breathe.  She states that she got her breath a little bit once EMS arrived and administered a nebulizer treatment.  She states that EMS told her that her nebulizer was "hooked up wrong."  And he does not think it was working.  She has been on 4 L of oxygen here and is on 3 L normally at home.  She has been a heavy smoker for several years.  She states that she does not think she has any fluid on.  She missed dialysis earlier today but did attend treatment on Wednesday/7/29.  She has a cough productive of yellow sputum.  Had BIPAP earlier and this is to be reinitiated.  Her dry weight was just raised from 67.5 to 68.5 kg.  On 7/29 she left HD at 70.4 kg (had 0.7 kg removed).  On 7/27 she left at 69.2 kg.  On 7/24 she left at 68.9 kg.  On 7/22 she left at 69.1 kg.  Usually has had 1.1 - 2.3 kg removed recently with 2.3 on higher end.   Spoke  again with RN this evening after my exam and the patient is comfortable on the BIPAP and s/p nebulizer treatment and not in acute distress at this time.  SARS coronavirus 2 negative.    Intake/Output Summary (Last 24 hours) at 06/18/2019 0550 Last data filed at 06/17/2019 2058 Gross per 24 hour  Intake 1668.39 ml  Output 2050 ml  Net -381.61 ml    Vitals:  Vitals:   06/17/19 2329 06/18/19 0000 06/18/19 0247 06/18/19 0400  BP: 117/74  127/75   Pulse: 84 88 92 89  Resp: 20 17 18 18   Temp:   97.7 F (36.5 C)   TempSrc:   Oral   SpO2: 100% 100% 96% 96%  Weight:      Height:         Physical Exam:   General adult female in bed in no acute distress at rest HEENT normocephalic atraumatic extraocular movements intact sclera anicteric Neck supple trachea midline Lungs clear to auscultation and unlabored at rest   Heart S1S2; no rub  Abdomen soft nontender nondistended Extremities no pitting edema  Psych normal mood and affect Access: RUE AVF with bruit and thrill   Medications reviewed   Labs:  BMP Latest Ref Rng & Units 06/17/2019 06/16/2019 05/10/2019  Glucose  70 - 99 mg/dL 143(H) 178(H) 120(H)  BUN 8 - 23 mg/dL 59(H) 50(H) 106(H)  Creatinine 0.44 - 1.00 mg/dL 9.34(H) 8.66(H) 10.23(H)  Sodium 135 - 145 mmol/L 133(L) 133(L) 142  Potassium 3.5 - 5.1 mmol/L 4.9 4.4 3.9  Chloride 98 - 111 mmol/L 94(L) 92(L) 108  CO2 22 - 32 mmol/L 20(L) 21(L) 18(L)  Calcium 8.9 - 10.3 mg/dL 9.4 9.8 9.0    Dialysis orders: Monday Wednesday Friday schedule at [facility] Estimated dry weight of 68.5 kg.  Note that this was just raised from 67.5 kg.   Blood flow 350 dialysate flow auto flow 1.5 2K/2 calcium bath Aranesp 80 mcg weekly given on 7/29 outpt Sensipar 30 mg 3 times a week at dialysis Calcitriol 1.25 mcg 3 times a week at dialysis   Assessment/Plan:   # End-stage renal disease - HD per MWF schedule - Note that she has been leaving above her dry weight and this was just raised recently to  reflect that.  Will follow weights to guide her true EDW - Renal panel in AM   # COPD exacerbation - on nebulizer treatments and steroids per primary team   # Right lower lobe PNA - On ceftriaxone and azithro per primary team   # Hypertension  - acceptable control   # GI bleed  - off of heparin gtt - Will give PRBC's x 1 unit 8/2; 2nd is to be available as below  - Work-up per primary team   # Anemia - acute on chronic  - Chronic component secondary to CKD however with decline inpatient and patient s/p heparin gtt and history of GI bleed  - Acute work-up per primary team   - PRBC's x 1 unit today and anticipate possible need for PRBC's on 8/3 with HD (typed and crossed for 2 units - 2nd will still be available) - Status post aranesp on 7/29 outpatient as above  # Chronic hypoxic respiratory failure - 2/2 COPD  - Note pt has a 3 L oxygen requirement at home  - Oxygen saturation goals per primary team   # Secondary hyperparathyroidism - Continued sensipar and calcitriol here  - Please do not include Sensipar or calcitriol on the patient's home medication list on discharge as she takes this medication at dialysis and we do not want her to inadvertently receive treatment at home as well   Claudia Desanctis, MD 06/18/2019 5:50 AM

## 2019-06-18 NOTE — Progress Notes (Signed)
Progress Note  Patient Name: Kari Robinson Date of Encounter: 06/18/2019  Primary Cardiologist: [facility]- Dr. Louie Casa  Subjective   68 year old female with a history of end-stage renal disease, hypertension, coronary artery disease, COPD, tobacco abuse.  She presented with increasing shortness of breath.  She is been found to have pneumonia.  Troponin levels have been elevated but the trend is been flat which is consistent with demand ischemia in the setting of end-stage renal disease. Hemoglobin is down to 7.4. No further CP  Inpatient Medications    Scheduled Meds: . sodium chloride   Intravenous Once  . aspirin EC  81 mg Oral Daily  . atorvastatin  40 mg Oral Daily  . [START ON 06/19/2019] calcitRIOL  1.25 mcg Oral Q M,W,F-HD  . Chlorhexidine Gluconate Cloth  6 each Topical Q0600  . [START ON 06/19/2019] cinacalcet  30 mg Oral Q M,W,F-1800  . clopidogrel  75 mg Oral Daily  . heparin injection (subcutaneous)  5,000 Units Subcutaneous Q8H  . ipratropium-albuterol  3 mL Nebulization Q4H  . isosorbide mononitrate  30 mg Oral QHS  . methylPREDNISolone (SOLU-MEDROL) injection  125 mg Intravenous Once  . metoprolol succinate  50 mg Oral Daily  . predniSONE  40 mg Oral Q breakfast  . QUEtiapine  200 mg Oral QHS   Continuous Infusions: . sodium chloride Stopped (06/16/19 2110)  . azithromycin 500 mg (06/17/19 1739)  . cefTRIAXone (ROCEPHIN)  IV 2 g (06/17/19 1743)   PRN Meds:    Vital Signs    Vitals:   06/18/19 0000 06/18/19 0247 06/18/19 0400 06/18/19 0639  BP:  127/75    Pulse: 88 92 89   Resp: 17 18 18    Temp:  97.7 F (36.5 C)    TempSrc:  Oral    SpO2: 100% 96% 96% 97%  Weight:      Height:        Intake/Output Summary (Last 24 hours) at 06/18/2019 0851 Last data filed at 06/17/2019 2058 Gross per 24 hour  Intake 1668.39 ml  Output 2050 ml  Net -381.61 ml   Last 3 Weights 06/17/2019 06/17/2019 06/17/2019  Weight (lbs) 153 lb 7 oz 157 lb 6.5 oz 158 lb 15.2 oz   Weight (kg) 69.6 kg 71.4 kg 72.1 kg  Some encounter information is confidential and restricted. Go to Review Flowsheets activity to see all data.      Telemetry   NSR  - Personally Reviewed  ECG      - Personally Reviewed  Physical Exam    GEN:  Middle-aged female, appears chronically ill. Neck: No JVD Cardiac: RRR, no murmurs, rubs, or gallops.  Respiratory: Clear to auscultation bilaterally. GI: Soft, nontender, non-distended  MS: No edema; No deformity. Neuro:  Nonfocal  Psych: Normal affect   Labs    High Sensitivity Troponin:   Recent Labs  Lab 06/16/19 1829 06/16/19 2256 06/17/19 0318 06/17/19 1128 06/17/19 1843  TROPONINIHS 132* 235* 315* 323* 248*      Cardiac EnzymesNo results for input(s): TROPONINI in the last 168 hours. No results for input(s): TROPIPOC in the last 168 hours.   Chemistry Recent Labs  Lab 06/16/19 1629 06/17/19 0318 06/18/19 0643  NA 133* 133* 135  K 4.4 4.9 4.3  CL 92* 94* 94*  CO2 21* 20* 23  GLUCOSE 178* 143* 100*  BUN 50* 59* 47*  CREATININE 8.66* 9.34* 6.34*  CALCIUM 9.8 9.4 8.7*  PROT 6.6  --   --   ALBUMIN  3.7  --   --   AST 18  --   --   ALT 11  --   --   ALKPHOS 70  --   --   BILITOT 0.4  --   --   GFRNONAA 4* 4* 6*  GFRAA 5* 4* 7*  ANIONGAP 20* 19* 18*     Hematology Recent Labs  Lab 06/16/19 1629 06/17/19 0318 06/18/19 0246  WBC 13.9* 6.0 8.0  RBC 2.73* 2.55* 2.20*  HGB 9.1* 8.6* 7.4*  HCT 29.7* 26.9* 23.5*  MCV 108.8* 105.5* 106.8*  MCH 33.3 33.7 33.6  MCHC 30.6 32.0 31.5  RDW 18.8* 18.6* 19.3*  PLT 249 173 167    BNP Recent Labs  Lab 06/17/19 0318  BNP >4,500.0*     DDimer No results for input(s): DDIMER in the last 168 hours.   Radiology    Dg Chest Port 1 View  Result Date: 06/16/2019 CLINICAL DATA:  Shortness of breath EXAM: PORTABLE CHEST 1 VIEW COMPARISON:  May 09, 2019 FINDINGS: There is scarring on the left with postoperative change and volume loss. There is ill-defined patchy  opacity in the right lower lobe, felt to represent a combination of scarring and patchy pneumonia. Lungs elsewhere clear. Heart is upper normal in size with mild distortion of pulmonary vascularity on the left, stable pulmonary vascularity on the right within normal limits. No adenopathy. There is aortic atherosclerosis. There is calcification in each carotid and subclavian artery. No bone lesions. IMPRESSION: 1. Ill-defined opacity right base, felt to represent combination of scarring and pneumonia. 2.  Postoperative change on the left with volume loss and scarring. 3.  Stable cardiac silhouette. 4. Aortic Atherosclerosis (ICD10-I70.0). Calcification is noted in each carotid and proximal subclavian artery. Electronically Signed   By: Lowella Grip III M.D.   On: 06/16/2019 17:08    Cardiac Studies     Patient Profile     68 y.o. female  With COPD, ESRD,  CAD.   Admitted with increasing dyspnea   Assessment & Plan    1.   Chest pain: The patient has atypical chest pain.  Her main complaint is shortness of breath which is more consistent with COPD and probable pneumonia.  She has minimally elevated troponin levels but the trend is flat and is not consistent with acute coronary syndrome. She is not a good candidate for heart catheterization given her anemia and history of dark tarry stools.  She will need this worked up further before we consider cath/intervention.  Clinically she remained stable.  I would agree with treating her for pneumonia and COPD exacerbation. She will follow up with [name]- Dr. Louie Casa       For questions or updates, please contact [facility] Please consult [email or web address] for contact info under        Signed, Mertie Moores, MD  06/18/2019, 8:51 AM

## 2019-06-19 DIAGNOSIS — J9611 Chronic respiratory failure with hypoxia: Secondary | ICD-10-CM

## 2019-06-19 DIAGNOSIS — J189 Pneumonia, unspecified organism: Secondary | ICD-10-CM

## 2019-06-19 DIAGNOSIS — I255 Ischemic cardiomyopathy: Secondary | ICD-10-CM

## 2019-06-19 LAB — CBC
HCT: 28.7 % — ABNORMAL LOW (ref 36.0–46.0)
Hemoglobin: 9.1 g/dL — ABNORMAL LOW (ref 12.0–15.0)
MCH: 33.6 pg (ref 26.0–34.0)
MCHC: 31.7 g/dL (ref 30.0–36.0)
MCV: 105.9 fL — ABNORMAL HIGH (ref 80.0–100.0)
Platelets: 178 10*3/uL (ref 150–400)
RBC: 2.71 MIL/uL — ABNORMAL LOW (ref 3.87–5.11)
RDW: 20 % — ABNORMAL HIGH (ref 11.5–15.5)
WBC: 7.2 10*3/uL (ref 4.0–10.5)
nRBC: 0.7 % — ABNORMAL HIGH (ref 0.0–0.2)

## 2019-06-19 LAB — RENAL FUNCTION PANEL
Albumin: 3.2 g/dL — ABNORMAL LOW (ref 3.5–5.0)
Anion gap: 15 (ref 5–15)
BUN: 66 mg/dL — ABNORMAL HIGH (ref 8–23)
CO2: 22 mmol/L (ref 22–32)
Calcium: 8.7 mg/dL — ABNORMAL LOW (ref 8.9–10.3)
Chloride: 100 mmol/L (ref 98–111)
Creatinine, Ser: 8.11 mg/dL — ABNORMAL HIGH (ref 0.44–1.00)
GFR calc Af Amer: 5 mL/min — ABNORMAL LOW (ref >60)
GFR calc non Af Amer: 5 mL/min — ABNORMAL LOW (ref >60)
Glucose, Bld: 109 mg/dL — ABNORMAL HIGH (ref 70–99)
Phosphorus: 7.9 mg/dL — ABNORMAL HIGH (ref 2.5–4.6)
Potassium: 4.9 mmol/L (ref 3.5–5.1)
Sodium: 137 mmol/L (ref 135–145)

## 2019-06-19 LAB — FOLATE RBC
Folate, Hemolysate: >620 ng/mL
Folate, RBC: >2385 ng/mL (ref >498)
Hematocrit: 26 % — ABNORMAL LOW (ref 34.0–46.6)

## 2019-06-19 MED ORDER — CALCITRIOL 0.5 MCG PO CAPS
ORAL_CAPSULE | ORAL | Status: AC
  Filled 2019-06-19: qty 2

## 2019-06-19 MED ORDER — CALCITRIOL 0.25 MCG PO CAPS
ORAL_CAPSULE | ORAL | Status: AC
  Filled 2019-06-19: qty 1

## 2019-06-19 NOTE — Discharge Summary (Signed)
Name: Kari Robinson MRN: [id number] DOB: Oct 11, 1951 68 y.o. PCP: Tsosie Billing, MD (Inactive)  Date of Admission: 06/16/2019  4:24 PM Date of Discharge:  Attending Physician: Sid Falcon, MD  Discharge Diagnosis: 1. COPD exacerbation in the setting of CAP 2. Ischemic Cardiomyopathy 3. ESRD on HD 4. Chronic macrocytic anemia   Discharge Medications: Allergies as of 06/20/2019      Reactions   Citalopram Hydrobromide Hives   Morphine And Related Itching      Medication List    STOP taking these medications   diclofenac sodium 1 % Gel Commonly known as: Voltaren     TAKE these medications   albuterol 108 (90 Base) MCG/ACT inhaler Commonly known as: VENTOLIN HFA Inhale 2 puffs into the lungs every 4 (four) hours as needed for wheezing.   aspirin EC 81 MG tablet Take 81 mg by mouth daily.   atorvastatin 40 MG tablet Commonly known as: LIPITOR Take 40 mg by mouth daily.   Auryxia 1 GM 210 MG(Fe) tablet Generic drug: ferric citrate Take 210-420 mg by mouth See admin instructions. Take 2 tablets (420mg ) by mouth three times daily with meals, and 1 tablet (210mg ) by mouth with snacks   b complex-vitamin c-folic acid 0.8 MG Tabs tablet Take 1 tablet by mouth See admin instructions. Take one tablet by mouth on Sunday, Tuesday, Thursday, Saturday mornings. Take one tablet after dialysis on Monday, Wednesday, Friday   benzonatate 100 MG capsule Commonly known as: TESSALON Take 1 capsule (100 mg total) by mouth 3 (three) times daily as needed for up to 4 days for cough.   budesonide-formoterol 160-4.5 MCG/ACT inhaler Commonly known as: SYMBICORT Inhale 2 puffs into the lungs 2 (two) times daily.   buPROPion 150 MG 12 hr tablet Commonly known as: WELLBUTRIN SR Take 150 mg by mouth 2 (two) times daily.   cefdinir 300 MG capsule Commonly known as: OMNICEF Take 1 capsule Wednesday (06/21/19) after dialysis   cinacalcet 30 MG tablet Commonly known as:  SENSIPAR Take 1 tablet (30 mg total) by mouth every Monday, Wednesday, and Friday at 6 PM.   clopidogrel 75 MG tablet Commonly known as: PLAVIX Take 75 mg by mouth daily.   ipratropium-albuterol 0.5-2.5 (3) MG/3ML Soln Commonly known as: DUONEB Take 3 mLs by nebulization every 6 (six) hours as needed.   isosorbide mononitrate 30 MG 24 hr tablet Commonly known as: IMDUR Take 30 mg by mouth at bedtime.   lidocaine-prilocaine cream Commonly known as: EMLA Apply 1 application topically every Monday, Wednesday, and Friday. Prior to dialysis   metoprolol succinate 50 MG 24 hr tablet Commonly known as: TOPROL-XL Take 1 tablet (50 mg total) by mouth daily. Take with or immediately following a meal.   OXYGEN Inhale 2 L into the lungs continuous.   predniSONE 20 MG tablet Commonly known as: DELTASONE Take 2 tablets Wednesday (06/21/19)   SEROquel 200 MG tablet Generic drug: QUEtiapine Take 200 mg by mouth at bedtime.   Spiriva HandiHaler 18 MCG inhalation capsule Generic drug: tiotropium Place 1 capsule (18 mcg total) into inhaler and inhale daily.            Durable Medical Equipment  (From admission, onward)         Start     Ordered   06/20/19 0000  For home use only DME Nebulizer machine    Question Answer Comment  Patient needs a nebulizer to treat with the following condition COPD (chronic obstructive pulmonary disease) (  [place])   Length of Need Lifetime      06/20/19 1126          Disposition and follow-up:   Kari Robinson was discharged from [facility] in Stable condition.  At the hospital follow up visit please address:  1.  Problem List COPD exacerbation - thought to be brought on by underlying community-acquired pneumonia. Dyspnea responding to prednisone burst and duonebs. CAP treated with ceftriaxone/azithromycin. At discharge, pt had one more dose of prednisone and antibiotic to complete. Pharmacy educated pt at discharge regarding  home medications and proper inhaler technique, can follow-up with patient about adherence and multiple exacerbations requiring hospitalizations. Follow-up to establish care with pulmonology scheduled (appointment for 8/13 with Dr. Valeta Harms).  Ischemic Cardiomyopathy - pt's elevated troponins thought to be due to demand ischemia in the setting of COPD and ESRD. Her chest pain was always pleuritic in nature and related to her dyspnea. Outpatient follow-up with cardiology scheduled with plans for [facility] (appointment 8/18 with [name], PA.)  ESRD on HD - pt terminated one dialysis session earlier while in the hospital because she stated she didn't feel well. Vital signs were stable and she was in no distress. She fully completed her next dialysis session and felt much better afterwards. Increased fluid volume potentially contributed to her dyspnea as well. Can continue to challenge patient's true dry weight outpatient. Instructed to resume normal dialysis schedule on 06/21/19.  Chronic macrocytic anemia - pt appears to have a chronic anemia related to her ESRD. She did receive 1 unit pRBCs. Last Hgb was 10.7 (baseline appears to be between 8.5-10)  2.  Labs / imaging needed at time of follow-up: could consider CBC if concern for symptomatic anemia  3.  Pending labs/ test needing follow-up: None  Follow-up Appointments: Follow-up Information    Tsosie Billing, MD. Call in 1 week(s).   Specialty: Internal Medicine Why: Call in 1 week for hospital follow-up. Contact information: [place] [address] [phone]        [name], [place]. Go to.   Specialties: Cardiology, Radiology Why: Go to cardiology follow-up appointment on Tuesday 07/04/19 at [facility] information: [address] [place] [address] [phone]        June Leap L, DO. Go to.   Specialty: Pulmonary Disease Why: Go to appointment on Thursday 06/29/19 at 11:30AM to  establish care with pulmonology. Contact information: [place] [address] [phone]           Hospital Course by problem list: Kari Robinson is a 68 year old female with a history of COPD, 200 pack year smoking hx, multiple hospitalizations due to COPD exacerbations, L lower lung lobectomy due to cancer in 2017, ESRD on HD (MWF), HTN, HLD, STEMI w/ stent placement on 2 separate occasions, Afib w/ RVR, HFrEF (40% on echo 02/25/2019) who presented with acute SOB consistent with a COPD exacerbation secondary to community-acquired pneumonia in addition to increased fluid volume due to patient missing dialysis on Friday 7/31 when she was admitted.   1. COPD exacerbation secondary to CAP Kari Robinson presented to hospital ED with dyspnea that improved with Duonebs while in the ED. She had a leukocytosis and opacity in the R base, thought to represent a combination of scarring and pneumonia. She was given a 5 day burst of prednisone 40mg  and q6h Duonebs for her COPD exacerbation. The CAP pneumonia was treated with azithromycin  and ceftriaxone. Pt stated her cough and SOB improved with those treatments. She was discharged with 1 tablet of 40mg  prednisone to take on 8/5 to complete the 5 day burst, and she needed one 300mg  dose Omnicef to take on 8/5 after dialysis. Pt has a history of multiple hospitalizations due to COPD exacerbations, so pharmacy reviewed her medications and appropriate inhaler technique before discharge and outpatient care with pulmonology was scheduled - appointment for 8/13 with Dr. Valeta Harms.    2. Ischemic Cardiomyopathy Pt endorsed pleuritic chest pain in addition to her dyspnea on admission. Her initial high sensitivity troponin was elevated to 81 and continued to rise on subsequent lab draws. Pt was placed on IV heparin and cardiology was consulted. Cardiology felt her elevated troponin were a result of demand ischemic in the setting of COPD exacerbation,  pneumonia, and ESRD as presentation not consistent with ACS and troponin trend flattening. Cardiac cath/intervention was deferred as pt was clinically stable and in light of patient's anemia. Lateral T wave inversion noted to be slightly worse than previous by cardiology, so outpatient follow-up was scheduled with plans for [facility] - appointment 8/18 with [name], PA.  3. ESRD on HD Kari Robinson missed her usual dialysis session when she was in the emergency room on 7/31. Pt received dialysis twice while admitted - Saturday 8/1 and Monday 8/3. HD was terminated with 1 hour remaining on 8/1 due to patient not feeling well. At that time her vitals were stable and she was in no apparent distress. She was able to tolerate a full dialysis session on 8/3 and her breathing was much improved when she was seen afterwards. Pt instructed to return to her usual dialysis schedule and center on 06/21/19. Of note, prior to this admission, she was leaving dialysis above her weight goal and her dry weight was raise to reflect that. Can continue to challenge true estimate dry weight.   4. Chronic macrocytic anemia Pt's Hgb appears to be chronically low secondary to chronic kidney disease. She received 1 unit pRBCs on 8/2, when Hgb was 7.4. Her baseline appears to be between 8.5-10. Pt endorsed some dark stools on admission, but stated that resolved. Was on heparin drip, but had no subsequent evidence for acute blood loss. Normal Vitamin B12 and folate. Normal iron and TIBC, elevated ferritin to 1,208. Hgb at discharge was uptrending with last measurement 10.7.  Discharge Vitals:   BP 134/85 (BP Location: Left Arm)    Pulse 84    Temp (!) 97.5 F (36.4 C) (Oral)    Resp 15    Ht 5\' 4"  (1.626 m)    Wt 68.5 kg    SpO2 100%    BMI 25.92 kg/m   Pertinent Labs, Studies, and Procedures:  Pertinent Labs  CBC Latest Ref Rng & Units 06/20/2019 06/19/2019 06/18/2019  WBC 4.0 - 10.5 K/uL 8.9 7.2 8.0  Hemoglobin 12.0 - 15.0  g/dL 10.7(L) 9.1(L) 7.4(L)  Hematocrit 36.0 - 46.0 % 34.1(L) 28.7(L) 23.5(L)  Platelets 150 - 400 K/uL 234 178 167   BMP Latest Ref Rng & Units 06/19/2019 06/18/2019 06/17/2019  Glucose 70 - 99 mg/dL 109(H) 100(H) 143(H)  BUN 8 - 23 mg/dL 66(H) 47(H) 59(H)  Creatinine 0.44 - 1.00 mg/dL 8.11(H) 6.34(H) 9.34(H)  Sodium 135 - 145 mmol/L 137 135 133(L)  Potassium 3.5 - 5.1 mmol/L 4.9 4.3 4.9  Chloride 98 - 111 mmol/L 100 94(L) 94(L)  CO2 22 - 32 mmol/L 22 23 20(L)  Calcium 8.9 - 10.3 mg/dL 8.7(L) 8.7(L) 9.4   CXR on 06/16/19 FINDINGS: There is scarring on the left with postoperative change and volume loss. There is ill-defined patchy opacity in the right lower lobe, felt to represent a combination of scarring and patchy pneumonia. Lungs elsewhere clear. Heart is upper normal in size with mild distortion of pulmonary vascularity on the left, stable pulmonary vascularity on the right within normal limits. No adenopathy. There is aortic atherosclerosis. There is calcification in each carotid and subclavian artery. No bone lesions.  IMPRESSION: 1. Ill-defined opacity right base, felt to represent combination of scarring and pneumonia.  2.  Postoperative change on the left with volume loss and scarring.  3.  Stable cardiac silhouette.  4. Aortic Atherosclerosis (ICD10-I70.0). Calcification is noted in each carotid and proximal subclavian artery.   EKG Interpretation  Date/Time:  Friday June 16 2019 16:26:23 EDT Ventricular Rate:  121 PR Interval:    QRS Duration: 104 QT Interval:  345 QTC Calculation: 490 R Axis:   74 Text Interpretation:  Sinus tachycardia RSR' in V1 or V2, right VCD or RVH Repol abnrm suggests ischemia, lateral leads Nonspecific ST abnormality No significant change since last tracing Confirmed by Lajean Saver [id number]) on 06/16/2019 4:31:11 PM      Echocardiogram on 06/17/19 IMPRESSIONS  1. The left ventricle has mild-moderately reduced systolic function, with an  ejection fraction of 40-45%. The cavity size was normal. There is mildly increased left ventricular wall thickness. Left ventricular diastolic Doppler parameters are consistent  with impaired relaxation. Elevated mean left atrial pressure.  2. The right ventricle has normal systolic function. The cavity was normal. There is no increase in right ventricular wall thickness.  3. Left atrial size was mildly dilated.  4. No evidence of mitral valve stenosis.  5. The aortic valve has an indeterminate number of cusps. Severely thickening of the aortic valve. Severe calcifcation of the aortic valve. No stenosis of the aortic valve.  6. The aorta is normal in size and structure.  7. The aortic root is normal in size and structure.  8. Pulmonary hypertension is not present, PASP is 27 mmHg.  FINDINGS  Left Ventricle: The left ventricle has mild-moderately reduced systolic function, with an ejection fraction of 40-45%. The cavity size was normal. There is mildly increased left ventricular wall thickness. Left ventricular diastolic Doppler parameters  are consistent with impaired relaxation. Elevated mean left atrial pressure  Right Ventricle: The right ventricle has normal systolic function. The cavity was normal. There is no increase in right ventricular wall thickness.  Left Atrium: Left atrial size was mildly dilated.  Right Atrium: Right atrial size was normal in size.  Interatrial Septum: No atrial level shunt detected by color flow Doppler.  Pericardium: There is no evidence of pericardial effusion.  Mitral Valve: The mitral valve is normal in structure. Mitral valve regurgitation is mild by color flow Doppler. No evidence of mitral valve stenosis.  Tricuspid Valve: The tricuspid valve is normal in structure. Tricuspid valve regurgitation is mild by color flow Doppler.  Aortic Valve: The aortic valve has an indeterminate number of cusps Severely thickening of the aortic valve. Severe  calcifcation of the aortic valve. Aortic valve regurgitation was not visualized by color flow Doppler. There is No stenosis of the aortic  valve, with a calculated valve area of 1.45 cm.  Pulmonic Valve: The pulmonic valve was not well visualized. Pulmonic valve regurgitation is not visualized by color flow Doppler. No evidence of pulmonic  stenosis.  Aorta: The aortic root is normal in size and structure. The aorta is normal in size and structure.  Pulmonary Artery: Pulmonary hypertension is not present, PASP is 27 mmHg.  Venous: The inferior vena cava is normal in size with greater than 50% respiratory variability.   Discharge Instructions: Discharge Instructions    Diet - low sodium heart healthy   Complete by: As directed    Discharge instructions   Complete by: As directed    You were admitted to the hospital for a COPD exacerbation, likely because of an underlying pneumonia.  Continue taking the steroid for one more dose tomorrow for you COPD.  Take one more dose of the antibiotic tomorrow after dialysis for your pneumonia.    Keep using your oxygen and breathing treatments while at home, and go to dialysis tomorrow (06/21/19).   For home use only DME Nebulizer machine   Complete by: As directed    Patient needs a nebulizer to treat with the following condition: COPD (chronic obstructive pulmonary disease) ([place])   Length of Need: Lifetime   Increase activity slowly   Complete by: As directed    Increase activity slowly   Complete by: As directed       Signed: Ladona Horns, MD 06/20/2019, 11:28 AM   Pager: [phone]

## 2019-06-19 NOTE — Progress Notes (Signed)
  Date: 06/19/2019  Patient name: Kari Robinson  Medical record number: [id number]  Date of birth: 1951-07-16   I have seen and evaluated this patient and I have discussed the plan of care with the house staff. Please see Dr. Ronnald Ramp' note for complete details. I concur with her findings and plan.    Sid Falcon, MD 06/19/2019, 12:35 PM

## 2019-06-19 NOTE — Progress Notes (Signed)
   Subjective: Pt seen this morning at dialysis. She is saturating well on 3L [place]. She is anxious about still having pleuritic chest pain but feels better than when she came in. Endorses continued cough and improving shortness of breath.  Objective:  Vital signs in last 24 hours: Vitals:   06/18/19 2009 06/18/19 2356 06/19/19 0013 06/19/19 0308  BP: (!) 133/94 (!) 136/94  (!) 139/92  Pulse: 87 91 94 91  Resp: 17 19 16 19   Temp: 97.9 F (36.6 C) 97.7 F (36.5 C)  97.6 F (36.4 C)  TempSrc: Oral Oral  Oral  SpO2: 99% 99%  98%  Weight:      Height:       Physical Exam Vitals signs and nursing note reviewed.  Constitutional:      General: She is not in acute distress. Cardiovascular:     Rate and Rhythm: Normal rate and regular rhythm.     Heart sounds: Normal heart sounds. No murmur. No friction rub. No gallop.   Pulmonary:     Effort: Pulmonary effort is normal. No respiratory distress.     Comments: Diffuse end expiratory wheezes. Neurological:     Mental Status: She is alert.      Assessment/Plan:  Assessment - Pt is a 68 year old F with history of COPD with multiple prior hospitalizations for exacerbations, 200 pack year smoking history, L lower lung lobectomy due to cancer in 2017, ESRD on HD (MWF), HTN, HLD, STEMI w/ stent placement x2, Afib w/ RVR, HFrEF (EF 40% on 02/25/19) who presented for shortness of breath, most likely due to COPD exacerbation secondary to CAP.   1. COPD Exacerbation likely secondary to CAP - Shortness of breath improving. Normally on 2-3L O2 at home, and currently on 3L O2 with O2 saturation 98%. Expiratory wheezes throughout, but not in respiratory distress. Respiratory status should continue to improve today after fluid removal from dialysis - continue prednisone tablet 40 mg daily on day 3 of 5  - duonebs (ipratropium-albuterol) q4hr - encouraged tessalon for her cough   2. RLL Pneumonia - R lower lung opacity seen on CXR, "represent  combination of scarring and pneumonia". Leukocytosis on admission now resolved. - continue ceftriaxone and azithromycin for CAP coverage - on day 4 of 5     3. Ischemic Cardiomyopathy - Troponins have downtrended, appeared to be due to demand ischemia in the setting of COPD exacerbation, CAP, and ESRD. Echo stable. No signs of ischemia on EKG. Chest pain is pleuritic in nature.  Pt will follow-up with cardiology outpatient.  - continue ASA 81mg  daily  - continue metoprolol 50mg  daily and plavix 75mg  daily   4. ESRD on HD (MWF) - Pt getting dialysis this morning.  - continue HD MWF - continue home Sensipar 30mg  MWF Nephrology following, appreciate recommendations.   5. Macrocytic anemia - Pt's Hgb today was 9.1 s/p 1 unit pRBCs yesterday. Appears chronically low with baseline 9-10?[name] MCV consistently elevated >101. - elevated ferratin with normal B12, folate, iron, and TIBC   6. HTN - isosorbide mononitrate 30mg  qhs   7. HLD - atorvastatin 40mg  daily  8. Insomnia - continue home seroquel 200mg  qhs    Diet - Renal Diet DVT ppx - heparin 5,000 units subq q8h Fluids - none CODE STATUS - FULL CODE   Dispo: Anticipated discharge in approximately 1 day.   Ladona Horns, MD 06/19/2019, 6:12 AM Pager: [phone]

## 2019-06-19 NOTE — Progress Notes (Signed)
Progress Note  Patient Name: Kari Robinson Date of Encounter: 06/19/2019  Primary Cardiologist:   No primary care provider on file.   Subjective   She complains of chest pain when she breaths in.  It is sharp.    Inpatient Medications    Scheduled Meds: . sodium chloride   Intravenous Once  . aspirin EC  81 mg Oral Daily  . atorvastatin  40 mg Oral Daily  . calcitRIOL  1.25 mcg Oral Q M,W,F-HD  . Chlorhexidine Gluconate Cloth  6 each Topical Q0600  . Chlorhexidine Gluconate Cloth  6 each Topical Q0600  . cinacalcet  30 mg Oral Q M,W,F-1800  . clopidogrel  75 mg Oral Daily  . heparin injection (subcutaneous)  5,000 Units Subcutaneous Q8H  . ipratropium-albuterol  3 mL Nebulization Q4H  . isosorbide mononitrate  30 mg Oral QHS  . metoprolol succinate  50 mg Oral Daily  . predniSONE  40 mg Oral Q breakfast  . QUEtiapine  200 mg Oral QHS   Continuous Infusions: . sodium chloride Stopped (06/16/19 2110)  . azithromycin 250 mL/hr at 06/18/19 1800  . cefTRIAXone (ROCEPHIN)  IV Stopped (06/18/19 1705)   PRN Meds: benzonatate   Vital Signs    Vitals:   06/18/19 2009 06/18/19 2356 06/19/19 0013 06/19/19 0308  BP: (!) 133/94 (!) 136/94  (!) 139/92  Pulse: 87 91 94 91  Resp: 17 19 16 19   Temp: 97.9 F (36.6 C) 97.7 F (36.5 C)  97.6 F (36.4 C)  TempSrc: Oral Oral  Oral  SpO2: 99% 99%  98%  Weight:      Height:        Intake/Output Summary (Last 24 hours) at 06/19/2019 0725 Last data filed at 06/19/2019 0500 Gross per 24 hour  Intake 1733.96 ml  Output 201 ml  Net 1532.96 ml   Filed Weights   06/17/19 0351 06/17/19 0715 06/17/19 0958  Weight: 72.1 kg 71.4 kg 69.6 kg    Telemetry    NSR, with ectopy - Personally Reviewed  ECG    NA - Personally Reviewed  Physical Exam   GEN: No acute distress.   Neck: No  JVD Cardiac: RRR, 2/6 apical systolic murmur radiating out the outflow tract.  No diastolic murmurs Respiratory: Clear  to auscultation  bilaterally. GI: Soft, nontender, non-distended  MS: No  edema; No deformity. Neuro:  Nonfocal   Psych: Normal affect    Labs    Chemistry Recent Labs  Lab 06/16/19 1629 06/17/19 0318 06/18/19 0643 06/19/19 0239  NA 133* 133* 135 137  K 4.4 4.9 4.3 4.9  CL 92* 94* 94* 100  CO2 21* 20* 23 22  GLUCOSE 178* 143* 100* 109*  BUN 50* 59* 47* 66*  CREATININE 8.66* 9.34* 6.34* 8.11*  CALCIUM 9.8 9.4 8.7* 8.7*  PROT 6.6  --   --   --   ALBUMIN 3.7  --   --  3.2*  AST 18  --   --   --   ALT 11  --   --   --   ALKPHOS 70  --   --   --   BILITOT 0.4  --   --   --   GFRNONAA 4* 4* 6* 5*  GFRAA 5* 4* 7* 5*  ANIONGAP 20* 19* 18* 15     Hematology Recent Labs  Lab 06/17/19 0318 06/18/19 0246 06/19/19 0239  WBC 6.0 8.0 7.2  RBC 2.55* 2.20* 2.71*  HGB 8.6*  7.4* 9.1*  HCT 26.9* 23.5* 28.7*  MCV 105.5* 106.8* 105.9*  MCH 33.7 33.6 33.6  MCHC 32.0 31.5 31.7  RDW 18.6* 19.3* 20.0*  PLT 173 167 178    Cardiac EnzymesNo results for input(s): TROPONINI in the last 168 hours. No results for input(s): TROPIPOC in the last 168 hours.   BNP Recent Labs  Lab 06/17/19 0318  BNP >4,500.0*     DDimer No results for input(s): DDIMER in the last 168 hours.   Radiology    No results found.  Cardiac Studies   ECHO:  1. The left ventricle has mild-moderately reduced systolic function, with an ejection fraction of 40-45%. The cavity size was normal. There is mildly increased left ventricular wall thickness. Left ventricular diastolic Doppler parameters are consistent  with impaired relaxation. Elevated mean left atrial pressure.  2. The right ventricle has normal systolic function. The cavity was normal. There is no increase in right ventricular wall thickness.  3. Left atrial size was mildly dilated.  4. No evidence of mitral valve stenosis.  5. The aortic valve has an indeterminate number of cusps. Severely thickening of the aortic valve. Severe calcifcation of the aortic  valve. No stenosis of the aortic valve.  6. The aorta is normal in size and structure.  7. The aortic root is normal in size and structure.  8. Pulmonary hypertension is not present, PASP is 27 mmHg.  Patient Profile     68 y.o. female with a history of end-stage renal disease, hypertension, coronary artery disease, COPD, tobacco abuse.  She presented with increasing shortness of breath.  She has elevated troponin with anemia.  She has had demand ischemia.    Assessment & Plan    CHEST PAIN:  Probable demand ischemia with COPD exacerbation.  She is not a good candidate for invasive evaluation.  EF is mildly reduced but this is not lower than previous echoes.    For now we are considering continued conservative therapy this admission but will follow for resolution of her acute issues and could consider cardiac cath given the multiple presentations.    For questions or updates, please contact [facility] Please consult [email or web address] for contact info under Cardiology/STEMI.   Signed, Minus Breeding, MD  06/19/2019, 7:25 AM

## 2019-06-19 NOTE — Progress Notes (Signed)
[facility] Progress Note  Name: Kari Robinson MRN: [id number] DOB: 05/01/1951  Chief Complaint:  Shortness of breath   Subjective:  On HD this am.  -------------------- Background on consult : Kari Robinson is a 68 y.o. female with a history including end-stage renal disease on HD Monday Wednesday Friday, COPD, CAD, HTN, and tobacco abuse who presented to the hospital with shortness of breath.  She states that she called EMS because she could not breathe.  She states that she got her breath a little bit once EMS arrived and administered a nebulizer treatment.  She states that EMS told her that her nebulizer was "hooked up wrong."  And he does not think it was working.  She has been on 4 L of oxygen here and is on 3 L normally at home.  She has been a heavy smoker for several years.  She states that she does not think she has any fluid on.  She missed dialysis earlier today but did attend treatment on Wednesday/7/29.  She has a cough productive of yellow sputum.  Had BIPAP earlier and this is to be reinitiated.  Her dry weight was just raised from 67.5 to 68.5 kg.  On 7/29 she left HD at 70.4 kg (had 0.7 kg removed).  On 7/27 she left at 69.2 kg.  On 7/24 she left at 68.9 kg.  On 7/22 she left at 69.1 kg.  Usually has had 1.1 - 2.3 kg removed recently with 2.3 on higher end.   Spoke again with RN this evening after my exam and the patient is comfortable on the BIPAP and s/p nebulizer treatment and not in acute distress at this time.  SARS coronavirus 2 negative.    Intake/Output Summary (Last 24 hours) at 06/19/2019 1419 Last data filed at 06/19/2019 1130 Gross per 24 hour  Intake 806.95 ml  Output 3166 ml  Net -2359.05 ml    Vitals:  Vitals:   06/19/19 1000 06/19/19 1030 06/19/19 1130 06/19/19 1234  BP: (!) 123/95 128/87 131/70   Pulse: 67 (!) 59 89   Resp:   20   Temp:   97.6 F (36.4 C)   TempSrc:   Oral   SpO2:   98% 98%  Weight:   68.5 kg   Height:          Physical Exam:   General adult female in bed in no acute distress at rest HEENT normocephalic atraumatic extraocular movements intact sclera anicteric Neck supple trachea midline Lungs clear to auscultation and unlabored at rest   Heart S1S2; no rub  Abdomen soft nontender nondistended Extremities no pitting edema  Psych normal mood and affect Access: RUE AVF with bruit and thrill   Medications reviewed   Labs:  BMP Latest Ref Rng & Units 06/19/2019 06/18/2019 06/17/2019  Glucose 70 - 99 mg/dL 109(H) 100(H) 143(H)  BUN 8 - 23 mg/dL 66(H) 47(H) 59(H)  Creatinine 0.44 - 1.00 mg/dL 8.11(H) 6.34(H) 9.34(H)  Sodium 135 - 145 mmol/L 137 135 133(L)  Potassium 3.5 - 5.1 mmol/L 4.9 4.3 4.9  Chloride 98 - 111 mmol/L 100 94(L) 94(L)  CO2 22 - 32 mmol/L 22 23 20(L)  Calcium 8.9 - 10.3 mg/dL 8.7(L) 8.7(L) 9.4    Dialysis: MWF [place]  3.5h  350/800  68.5kg  2/2 bath  R AVF 16g  Hep none Aranesp 80 mcg weekly given on 7/29 outpt Sensipar 30 mg 3 times a week at dialysis Calcitriol 1.25 mcg 3 times  a week at dialysis   Assessment/Plan:   # End-stage renal disease - HD per MWF schedule, HD today - Note that she has been leaving above her dry weight and this was just raised recently to reflect that - is at edw post HD this am  # COPD exacerbation - on nebulizer treatments and steroids per primary team   # Right lower lobe PNA - On ceftriaxone and azithro per primary team   # Hypertension  - acceptable control   # GI bleed  - off of heparin gtt - sp 1u prbc on 8/2, Hb 9 today - Work-up per primary team   # Anemia - acute on chronic  - Chronic component secondary to CKD however with decline inpatient and patient s/p heparin gtt and history of GI bleed  - sp 1u prbc on 8/2, Hb 9 today - Status post aranesp on 7/29 outpatient as above  # Chronic hypoxic respiratory failure - 2/2 COPD  - Note pt has a 3 L oxygen requirement at home  - Oxygen saturation goals per primary team    # Secondary hyperparathyroidism - Continued sensipar and calcitriol here  - Please do not include Sensipar or calcitriol on the patient's home medication list on discharge as she takes this medication at dialysis and we do not want her to inadvertently receive treatment at home as well   Sol Blazing, MD 06/19/2019 2:19 PM

## 2019-06-19 NOTE — Progress Notes (Signed)
Transported to HD by bed awake and alert. °

## 2019-06-19 NOTE — Plan of Care (Signed)

## 2019-06-20 DIAGNOSIS — Z888 Allergy status to other drugs, medicaments and biological substances status: Secondary | ICD-10-CM

## 2019-06-20 DIAGNOSIS — Z885 Allergy status to narcotic agent status: Secondary | ICD-10-CM

## 2019-06-20 DIAGNOSIS — Z9889 Other specified postprocedural states: Secondary | ICD-10-CM

## 2019-06-20 LAB — CBC
HCT: 34.1 % — ABNORMAL LOW (ref 36.0–46.0)
Hemoglobin: 10.7 g/dL — ABNORMAL LOW (ref 12.0–15.0)
MCH: 33.6 pg (ref 26.0–34.0)
MCHC: 31.4 g/dL (ref 30.0–36.0)
MCV: 107.2 fL — ABNORMAL HIGH (ref 80.0–100.0)
Platelets: 234 10*3/uL (ref 150–400)
RBC: 3.18 MIL/uL — ABNORMAL LOW (ref 3.87–5.11)
RDW: 19.3 % — ABNORMAL HIGH (ref 11.5–15.5)
WBC: 8.9 10*3/uL (ref 4.0–10.5)
nRBC: 0.9 % — ABNORMAL HIGH (ref 0.0–0.2)

## 2019-06-20 MED ORDER — BENZONATATE 100 MG PO CAPS: 100.0000 mg | ORAL_CAPSULE | Freq: Three times a day (TID) | ORAL | 0 refills | Status: AC | PRN

## 2019-06-20 MED ORDER — BUPROPION HCL ER (SR) 150 MG PO TB12
150.0000 mg | ORAL_TABLET | Freq: Two times a day (BID) | ORAL | Status: DC
  Administered 2019-06-20: 150 mg via ORAL
  Filled 2019-06-20 (×2): qty 1

## 2019-06-20 MED ORDER — CEFDINIR 300 MG PO CAPS: ORAL_CAPSULE | ORAL | 0 refills | Status: DC

## 2019-06-20 MED ORDER — PREDNISONE 20 MG PO TABS: ORAL_TABLET | ORAL | 0 refills | Status: DC

## 2019-06-20 NOTE — Progress Notes (Signed)
Pharmacist Heart Failure Core Measure Documentation  Assessment: Kari Robinson has an EF documented as 40-45% on 8/20 by ECHO  Rationale: Heart failure patients with left ventricular systolic dysfunction (LVSD) and an EF < 40% should be prescribed an angiotensin converting enzyme inhibitor (ACEI) or angiotensin receptor blocker (ARB) at discharge unless a contraindication is documented in the medical record.  This patient is not currently on an ACEI or ARB for HF.  This note is being placed in the record in order to provide documentation that a contraindication to the use of these agents is present for this encounter.  ACE Inhibitor or Angiotensin Receptor Blocker is contraindicated (specify all that apply)  []   ACEI allergy AND ARB allergy []   Angioedema []   Moderate or severe aortic stenosis []   Hyperkalemia []   Hypotension []   Renal artery stenosis [x]   Worsening renal function, preexisting renal disease or dysfunction   Bonnita Nasuti Pharm.D. CPP, BCPS Clinical Pharmacist [phone] 06/20/2019 12:06 PM

## 2019-06-20 NOTE — TOC Initial Note (Addendum)
Transition of Care [facility]) - Initial/Assessment Note    Patient Details  Name: Kari Robinson MRN: [id number] Date of Birth: [birth year]  [facility] CM/SW Contact:    Bartholomew Crews, RN Phone Number: [phone] 06/20/2019, 12:09 PM  Clinical Narrative:                 Notified by bedside RN of patient plans to transition home today. Spoke with patient at the bedside. PTA home with boyfriend. Attends HD MWF [place] 12pm chair time. Uses [employer] to get to/from HD. Telephone call to [employer] about transportation home from hospital - they will reach out to county to confirm if they can do this - CM provided contact information for f/u. - Update: [employer] unable to provide transportation since they did not bring her. [place] would not approve. CM will provide taxi voucher.   Has home oxygen from [facility] - asking about smaller tanks that she can carry stating her current tanks are too heavy. Spoke to [place] and asked about getting patient a D-tank for discharge - will bring to bedside. Patient states that she received a nebulizer last admission but it gets hot at the point where tubing plugs in to the nebulizer - nebulizer was provided by [facility] - number provided for outpatient f/u. Patient also stated that her rollator was received broken - advised to report this as well when she calls [employer] - patient verbalizes understanding.   PCP at [facility] - they provide transportation to/from appointments. [facility] recommended using [facility] fo respiratory medications and will reach out to patient's PCP.   Home Health PT orders. [facility] contacted - referral accepted.   No further transition of care needs identified.   Expected Discharge Plan: [place] Barriers to Discharge: No Barriers Identified   Patient Goals and CMS Choice   CMS Medicare.gov Compare Post Acute Care list provided to:: Patient Choice offered to / list presented to :  Patient  Expected Discharge Plan and Services Expected Discharge Plan: [place]   Discharge Planning Services: CM Consult Post Acute Care Choice: Durable Medical Equipment, Home Health Living arrangements for the past 2 months: Apartment Expected Discharge Date: 06/20/19               DME Arranged: Nebulizer/meds, Walker rolling with seat, Oxygen DME Agency: Eldridge Abrahams Date DME Agency Contacted: 06/20/19 Time DME Agency Contacted: [phone] Representative spoke with at DME Agency: Caryl Pina at [place] and Thedore Mins at [place]: PT, [place]: [place] Date [place]: 06/20/19 Time [place]: 1207 Representative spoke with at [place] Arrangements/Services Living arrangements for the past 2 months: Apartment Lives with:: Self, Significant Other Patient language and need for interpreter reviewed:: Yes Do you feel safe going back to the place where you live?: Yes      Need for Family Participation in Patient Care: Yes (Comment) Care giver support system in place?: Yes (comment) Current home services: DME Criminal Activity/Legal Involvement Pertinent to Current Situation/Hospitalization: No - Comment as needed  Activities of Daily Living Home Assistive Devices/Equipment: Nebulizer, Oxygen ADL Screening (condition at time of admission) Patient's cognitive ability adequate to safely complete daily activities?: Yes Is the patient deaf or have difficulty hearing?: No Does the patient have difficulty seeing, even when wearing glasses/contacts?: No Does the patient have difficulty concentrating, remembering, or making decisions?: No Patient able to  express need for assistance with ADLs?: Yes Does the patient have difficulty dressing or bathing?: No Independently performs ADLs?: Yes (appropriate for developmental age) Does the patient have difficulty walking or climbing stairs?:  No Weakness of Legs: Both Weakness of Arms/Hands: Both  Permission Sought/Granted                  Emotional Assessment Appearance:: Appears stated age Attitude/Demeanor/Rapport: Engaged Affect (typically observed): Accepting Orientation: : Oriented to Self, Oriented to Place, Oriented to  Time, Oriented to Situation Alcohol / Substance Use: Not Applicable Psych Involvement: No (comment)  Admission diagnosis:  Elevated troponin [R79.89] COPD exacerbation ([name]) [J44.1] ESRD (end stage renal disease) on dialysis ([place]) [N18.6, Z99.2] Acute respiratory failure with hypoxia ([name]) [J96.01] Chronic respiratory failure with hypoxia ([place]) [J96.11] Community acquired pneumonia of right lower lobe of lung ([place]) [J18.1] COPD with acute exacerbation ([place]) [J44.1] Patient Active Problem List   Diagnosis Date Noted  . Chronic respiratory failure with hypoxia ([place])   . Community acquired pneumonia of right lower lobe of lung ([place])   . COPD with acute exacerbation ([place]) 06/16/2019  . COPD exacerbation ([place]) 05/09/2019  . Leukocytosis 05/09/2019  . Lumbar stenosis 05/09/2019  . Fall 04/01/2019  . Leg weakness, bilateral 04/01/2019  . Tobacco abuse 04/01/2019  . Alcohol abuse 04/01/2019  . Transient loss of consciousness 02/24/2019  . ESRD (end stage renal disease) on dialysis ([place]) 02/24/2019  . Seizure-like activity ([place]) 02/24/2019  . NSTEMI (non-ST elevated myocardial infarction) ([place]) 02/24/2019  . Ischemic cardiomyopathy 02/24/2019  . Hypertensive emergency 09/30/2018  . Type II diabetes mellitus with renal manifestations ([place]) 09/30/2018  . HLD (hyperlipidemia) 09/30/2018  . Depression with anxiety 09/30/2018  . CKD (chronic kidney disease), stage V ([place]) 09/30/2018  . Acute on chronic respiratory failure with hypoxia ([place]) 09/30/2018  . Fluid overload 09/30/2018  . CAD (coronary artery disease) 09/30/2018  . Anemia of chronic disease 09/30/2018  . COPD (chronic obstructive  pulmonary disease) ([place])   . Acute on chronic combined systolic and diastolic CHF (congestive heart failure) ([place])   . Solitary right kidney 06/24/2018  . Chronic systolic CHF (congestive heart failure) ([place]) 06/24/2018  . Acute CHF (congestive heart failure) ([place]) 06/07/2018  . Hypertensive emergency 06/07/2018  . CKD (chronic kidney disease) stage 5, GFR less than 15 ml/min ([name]) 06/07/2018  . Normocytic normochromic anemia 06/07/2018  . DM (diabetes mellitus), type 2 with renal complications ([place]) 46/56/8127  . Hypertensive urgency 06/07/2018  . Occlusion and stenosis of carotid artery without mention of cerebral infarction 01/19/2012  . RENAL ARTERY STENOSIS 09/10/2010  . ABDOMINAL BRUIT 08/12/2010  . DEPRESSION 08/11/2010  . PERIPHERAL VASCULAR DISEASE 08/11/2010  . Essential hypertension 08/08/2010  . Coronary atherosclerosis 08/08/2010  . ARTHRALGIA 08/08/2010   PCP:  Tsosie Billing, MD (Inactive) Pharmacy:   [facility], [place] - [address], [address], [place] [address] Phone: [phone] Fax: [phone]  [facility] [phone] [place], [place] AT [place] [address] [place] [address] Phone: [phone] Fax: [phone]     Social Determinants of Health ([place]) Interventions    Readmission Risk Interventions No flowsheet data found.

## 2019-06-20 NOTE — Progress Notes (Signed)
Small O2 tank delivered, discharged home by wheelchair given voucher for taxi cab. Discharged instructions given to pt. Belongings taken home.

## 2019-06-20 NOTE — Progress Notes (Signed)
Student Pharmacist rounding with the IMTS/B2/Lane service was requested to provide extensive education regarding patient's COPD inhaler regimen prior to discharge. I created a chart for the patient detailing the drug, number of inhalations, timing of each inhaler/nebulizer treatment and daily frequency. Patient was also counseled on the appropriate times to use her rescue albuterol inhaler. Patient stated that she was under the impression that she was supposed to use it regularly, I instructed her that it is only to be used for when she is wheezing/experiencing symptoms. Patient was able to ask and have answered all questions regarding her medications and her disease state. She was able to repeat back to me how to use her inhalers and nebulizer and when she should use them each day. Importance of medication compliance was stressed to ensure patient is able to reduce her exacerbation risk and remain out of the hospital. Patient verbalized her understanding of the instructions provided.   [place]

## 2019-06-20 NOTE — Progress Notes (Addendum)
Physical Therapy Treatment Patient Details Name: Kari Robinson MRN: [id number] DOB: [birth year] Today's Date: 06/20/2019    History of Present Illness Pt is a 68 yo female admitted 06/17/19 with acute on chronic respiratory failure requiring bipap with elevated troponin. Pt with 4 admissions in 6 months. PMHx: COPD, HTN, HLD, depression, ESRD on MWF.    PT Comments    Pt pleasant and talkative throughout session. Pt slightly impulsive with decreased attention to lines and safety with cues for safety and positioning. Pt able to increase gait distance with need for seated rest and cues for breathing. Pt educated for HEp and encourage to be OOB as pt wanting to immediately return to bed with request for at least one hour OOB with pt agreeable.     Follow Up Recommendations  Home health PT;Supervision for mobility/OOB     Equipment Recommendations  None recommended by PT    Recommendations for Other Services       Precautions / Restrictions Precautions Precautions: Fall Precaution Comments: 4 recent falls Restrictions Weight Bearing Restrictions: No    Mobility  Bed Mobility Overal bed mobility: Modified Independent             General bed mobility comments: pt able to transition supine to sit without assist  Transfers Overall transfer level: Needs assistance   Transfers: Sit to/from Stand;Stand Pivot Transfers Sit to Stand: Supervision Stand pivot transfers: Supervision       General transfer comment: supervision for safety and direction to untangle lines  Ambulation/Gait Ambulation/Gait assistance: Min guard Gait Distance (Feet): 200 Feet Assistive device: Rolling walker (2 wheeled) Gait Pattern/deviations: Step-through pattern;Trunk flexed;Decreased stride length   Gait velocity interpretation: >2.62 ft/sec, indicative of community ambulatory General Gait Details: guarding for safety with cues for breathing technique with seated rest halfway through gait with  cues to lock rollator first. HR 94-96 during gait with SpO2 90-95% on 2L   Stairs             Wheelchair Mobility    Modified Rankin (Stroke Patients Only)       Balance Overall balance assessment: Mild deficits observed, not formally tested   Sitting balance-Leahy Scale: Good     Standing balance support: No upper extremity supported;Single extremity supported;During functional activity Standing balance-Leahy Scale: Fair                             Cognition Arousal/Alertness: Awake/alert Behavior During Therapy: WFL for tasks assessed/performed Overall Cognitive Status: Within Functional Limits for tasks assessed                                        Exercises General Exercises - Lower Extremity Long Arc Quad: AROM;20 reps;Both;Seated Hip Flexion/Marching: AROM;20 reps;Both;Seated    General Comments       Pertinent Vitals/Pain Pain Assessment: No/denies pain    Home Living                      Prior Function            PT Goals (current goals can now be found in the care plan section) Acute Rehab PT Goals Patient Stated Goal: breathe better Progress towards PT goals: Progressing toward goals    Frequency           PT Plan Current plan remains appropriate  Co-evaluation              AM-PAC PT "6 Clicks" Mobility   Outcome Measure  Help needed turning from your back to your side while in a flat bed without using bedrails?: None Help needed moving from lying on your back to sitting on the side of a flat bed without using bedrails?: None Help needed moving to and from a bed to a chair (including a wheelchair)?: A Little Help needed standing up from a chair using your arms (e.g., wheelchair or bedside chair)?: A Little Help needed to walk in hospital room?: A Little Help needed climbing 3-5 steps with a railing? : A Little 6 Click Score: 20    End of Session Equipment Utilized During  Treatment: Oxygen Activity Tolerance: Patient tolerated treatment well Patient left: in chair;with call bell/phone within reach Nurse Communication: Mobility status PT Visit Diagnosis: Other abnormalities of gait and mobility (R26.89);Muscle weakness (generalized) (M62.81);Difficulty in walking, not elsewhere classified (R26.2)     Time: 8563-1497 PT Time Calculation (min) (ACUTE ONLY): 29 min  Charges:  $Gait Training: 8-22 mins $Therapeutic Exercise: 8-22 mins                     Trase Bunda Pam Drown, PT Acute Rehabilitation Services Pager: [phone] Office: [place] 06/20/2019, 1:15 PM

## 2019-06-20 NOTE — Discharge Instructions (Signed)
You were admitted to the hospital for shortness of breath. This was a result of a pneumonia, underlying COPD, and some extra fluid from missing dialysis.  You will need to continue taking an additional steroid dose tomorrow for your COPD. Also, take an additional dose of antibiotic tomorrow, after dialysis, for your pneumonia. You can take tessalon capsules at home for cough. These medications have been ordered for you and sent to [facility] on [address].   You have follow-up appointments scheduled with pulmonology and cardiology in the coming weeks. Additionally, call your primary care provider in 1 week for hospital follow-up.  Call your doctor if you begin experiencing worsening shortness of breath or chest pain, have fevers, or begin having any other worrisome symptoms.

## 2019-06-20 NOTE — Care Management Important Message (Signed)
Important Message  Patient Details  Name: Kari Robinson [place] MRN: [id number] Date of Birth: 29-Aug-1951   Medicare Important Message Given:  Yes     Memory Argue 06/20/2019, 1:28 PM   Patient Signed IM

## 2019-06-20 NOTE — Progress Notes (Signed)
  Date: 06/20/2019  Patient name: Kari Robinson  Medical record number: [id number]  Date of birth: 1951/11/16   I have seen and evaluated this patient and I have discussed the plan of care with the house staff. Please see Dr. Ronnald Ramp' note for complete details. I concur with her findings and plan.  Plan for discharge today.   Sid Falcon, MD 06/20/2019, 12:12 PM

## 2019-06-20 NOTE — Progress Notes (Signed)
Progress Note  Patient Name: Kari Robinson Date of Encounter: 06/20/2019  Primary Cardiologist:   No primary care provider on file.   Subjective   No pain.  Breathing OK.    Inpatient Medications    Scheduled Meds: . sodium chloride   Intravenous Once  . aspirin EC  81 mg Oral Daily  . atorvastatin  40 mg Oral Daily  . buPROPion  150 mg Oral BID  . calcitRIOL  1.25 mcg Oral Q M,W,F-HD  . Chlorhexidine Gluconate Cloth  6 each Topical Q0600  . Chlorhexidine Gluconate Cloth  6 each Topical Q0600  . cinacalcet  30 mg Oral Q M,W,F-1800  . clopidogrel  75 mg Oral Daily  . heparin injection (subcutaneous)  5,000 Units Subcutaneous Q8H  . ipratropium-albuterol  3 mL Nebulization Q4H  . isosorbide mononitrate  30 mg Oral QHS  . metoprolol succinate  50 mg Oral Daily  . predniSONE  40 mg Oral Q breakfast  . QUEtiapine  200 mg Oral QHS   Continuous Infusions: . sodium chloride Stopped (06/16/19 2110)  . azithromycin Stopped (06/19/19 1858)  . cefTRIAXone (ROCEPHIN)  IV Stopped (06/19/19 1659)   PRN Meds: benzonatate   Vital Signs    Vitals:   06/19/19 2220 06/19/19 2326 06/20/19 0000 06/20/19 0335  BP:  103/64  (!) 133/93  Pulse: 91   84  Resp: 16 17  (!) 23  Temp:  98.5 F (36.9 C)  98.1 F (36.7 C)  TempSrc:  Oral  Oral  SpO2: 96%  93% 98%  Weight:      Height:        Intake/Output Summary (Last 24 hours) at 06/20/2019 0802 Last data filed at 06/20/2019 0400 Gross per 24 hour  Intake 810 ml  Output 3165 ml  Net -2355 ml   Filed Weights   06/17/19 0958 06/19/19 0745 06/19/19 1130  Weight: 69.6 kg 71.9 kg 68.5 kg    Telemetry    NSR - Personally Reviewed  ECG    NA - Personally Reviewed  Physical Exam   GEN: No  acute distress.   Neck: No  JVD Cardiac: RRR, no murmurs, rubs, or gallops.  Respiratory: Clear   to auscultation bilaterally. GI: Soft, nontender, non-distended, normal bowel sounds  MS:  No edema; No deformity. Neuro:   Nonfocal   Psych: Oriented and appropriate    Labs    Chemistry Recent Labs  Lab 06/16/19 1629 06/17/19 0318 06/18/19 0643 06/19/19 0239  NA 133* 133* 135 137  K 4.4 4.9 4.3 4.9  CL 92* 94* 94* 100  CO2 21* 20* 23 22  GLUCOSE 178* 143* 100* 109*  BUN 50* 59* 47* 66*  CREATININE 8.66* 9.34* 6.34* 8.11*  CALCIUM 9.8 9.4 8.7* 8.7*  PROT 6.6  --   --   --   ALBUMIN 3.7  --   --  3.2*  AST 18  --   --   --   ALT 11  --   --   --   ALKPHOS 70  --   --   --   BILITOT 0.4  --   --   --   GFRNONAA 4* 4* 6* 5*  GFRAA 5* 4* 7* 5*  ANIONGAP 20* 19* 18* 15     Hematology Recent Labs  Lab 06/17/19 0318 06/17/19 1128 06/18/19 0246 06/19/19 0239  WBC 6.0  --  8.0 7.2  RBC 2.55*  --  2.20* 2.71*  HGB 8.6*  --  7.4* 9.1*  HCT 26.9* 26.0* 23.5* 28.7*  MCV 105.5*  --  106.8* 105.9*  MCH 33.7  --  33.6 33.6  MCHC 32.0  --  31.5 31.7  RDW 18.6*  --  19.3* 20.0*  PLT 173  --  167 178    Cardiac EnzymesNo results for input(s): TROPONINI in the last 168 hours. No results for input(s): TROPIPOC in the last 168 hours.   BNP Recent Labs  Lab 06/17/19 0318  BNP >4,500.0*     DDimer No results for input(s): DDIMER in the last 168 hours.   Radiology    No results found.  Cardiac Studies   ECHO:  1. The left ventricle has mild-moderately reduced systolic function, with an ejection fraction of 40-45%. The cavity size was normal. There is mildly increased left ventricular wall thickness. Left ventricular diastolic Doppler parameters are consistent  with impaired relaxation. Elevated mean left atrial pressure.  2. The right ventricle has normal systolic function. The cavity was normal. There is no increase in right ventricular wall thickness.  3. Left atrial size was mildly dilated.  4. No evidence of mitral valve stenosis.  5. The aortic valve has an indeterminate number of cusps. Severely thickening of the aortic valve. Severe calcifcation of the aortic valve. No stenosis of the aortic  valve.  6. The aorta is normal in size and structure.  7. The aortic root is normal in size and structure.  8. Pulmonary hypertension is not present, PASP is 27 mmHg.  Patient Profile     68 y.o. female with a history of end-stage renal disease, hypertension, coronary artery disease, COPD, tobacco abuse.  She presented with increasing shortness of breath.  She has elevated troponin with anemia.  She has had demand ischemia.    Assessment & Plan    CHEST PAIN:    Her pain has been atypical.  Her enzymes have been non diagnostic given the fact that she typically has had mildly elevated troponins in the past.  She does have lateral T wave inversion that is slightly worse than previous.  We will arrange follow up in the office and plans likely for [facility].    For questions or updates, please contact [facility] Please consult [email or web address] for contact info under Cardiology/STEMI.   Signed, Minus Breeding, MD  06/20/2019, 8:02 AM

## 2019-06-20 NOTE — Progress Notes (Signed)
[facility] Progress Note  Name: Kari Robinson MRN: [id number] DOB: 04/16/1951  Chief Complaint:  Shortness of breath   Subjective:  Seen in room, no c/o's today.   -------------------- Background on consult : Kari Robinson is a 68 y.o. female with a history including end-stage renal disease on HD Monday Wednesday Friday, COPD, CAD, HTN, and tobacco abuse who presented to the hospital with shortness of breath.  She states that she called EMS because she could not breathe.  She states that she got her breath a little bit once EMS arrived and administered a nebulizer treatment.  She states that EMS told her that her nebulizer was "hooked up wrong."  And he does not think it was working.  She has been on 4 L of oxygen here and is on 3 L normally at home.  She has been a heavy smoker for several years.  She states that she does not think she has any fluid on.  She missed dialysis earlier today but did attend treatment on Wednesday/7/29.   Intake/Output Summary (Last 24 hours) at 06/20/2019 1116 Last data filed at 06/20/2019 0400 Gross per 24 hour  Intake 810 ml  Output 3165 ml  Net -2355 ml    Vitals:  Vitals:   06/20/19 0800 06/20/19 0822 06/20/19 0936 06/20/19 1030  BP:  122/68  134/85  Pulse:   86 84  Resp:  19  15  Temp:  97.7 F (36.5 C)  (!) 97.5 F (36.4 C)  TempSrc:  Oral  Oral  SpO2: 98%   100%  Weight:      Height:         Physical Exam:   General adult female in bed in no acute distress at rest Neck supple trachea midline Lungs scattered post wheezing / rhonchi bilat lower lung fields Heart S1S2; no rub  Abdomen soft nontender nondistended Extremities no pitting edema  Psych normal mood and affect Access: RUE AVF with bruit and thrill   Medications reviewed   Labs:  BMP Latest Ref Rng & Units 06/19/2019 06/18/2019 06/17/2019  Glucose 70 - 99 mg/dL 109(H) 100(H) 143(H)  BUN 8 - 23 mg/dL 66(H) 47(H) 59(H)  Creatinine 0.44 - 1.00 mg/dL 8.11(H)  6.34(H) 9.34(H)  Sodium 135 - 145 mmol/L 137 135 133(L)  Potassium 3.5 - 5.1 mmol/L 4.9 4.3 4.9  Chloride 98 - 111 mmol/L 100 94(L) 94(L)  CO2 22 - 32 mmol/L 22 23 20(L)  Calcium 8.9 - 10.3 mg/dL 8.7(L) 8.7(L) 9.4    Dialysis: [facility]  3.5h  350/800  68.5kg  2/2 bath  R AVF 16g  Hep none Aranesp 80 mcg weekly given on 7/29 outpt Sensipar 30 mg 3 times a week at dialysis Calcitriol 1.25 mcg 3 times a week at dialysis  CXR 7/31 > RLL density, scarring vs PNA   Assessment/Plan:   SOB/ dyspnea - prob COPD exacerbation - on nebulizer treatments and steroids per primary team, better  End-stage renal disease - HD per MWF schedule. HD tomorrow.  - Note that she has been leaving above her dry weight and this was just raised recently to reflect that - is at her edw today  - is for dc today per primary team notes  Right lower lobe PNA - On ceftriaxone and azithro per primary team  Hypertension  - acceptable control   GI bleed  - off of heparin gtt - melena resolving - sp 1u prbc on 8/2, Hb 9 today -  Work-up per primary team   Anemia - acute on chronic  - Chronic component secondary to CKD however with decline inpatient and patient s/p heparin gtt and history of GI bleed  - sp 1u prbc on 8/2, Hb up to 9 yest - Status post aranesp on 7/29 outpatient as above  Chronic hypoxic respiratory failure - 2/2 COPD  - Note pt has a 3 L oxygen requirement at home  - Oxygen saturation goals per primary team   Secondary hyperparathyroidism - Continued sensipar and calcitriol here  - Please do not include Sensipar or calcitriol on the patient's home medication list on discharge as she takes this medication at dialysis and we do not want her to inadvertently receive treatment at home as well   Sol Blazing, MD 06/20/2019 11:16 AM

## 2019-06-20 NOTE — Progress Notes (Signed)
Occupational Therapy Treatment Patient Details Name: Kari Robinson MRN: [id number] DOB: October 04, 1951 Today's Date: 06/20/2019    History of present illness Pt is a 68 yo female admitted 06/17/19 with acute on chronic respiratory failure requiring bipap with elevated troponin. Pt with 4 admissions in 6 months. PMHx: COPD, HTN, HLD, depression, ESRD on MWF.   OT comments  Patient seated in recliner, eager for dc home today.  Reviewed energy conservation techniques, PLB and utilized techniques during ADLs with supervision.  Simulated LB bathing and dressing from sitting with setup assist, toilet transfer with supervision and toileting with supervision.  Patient with no questions or concerns about dc home.  Reports will have support from significant other. Will follow acutely.    Follow Up Recommendations  No OT follow up    Equipment Recommendations  None recommended by OT    Recommendations for Other Services      Precautions / Restrictions Precautions Precautions: Fall Precaution Comments: 4 recent falls Restrictions Weight Bearing Restrictions: No       Mobility Bed Mobility               General bed mobility comments: OOB upon entry in recliner   Transfers Overall transfer level: Needs assistance   Transfers: Sit to/from Stand;Stand Pivot Transfers Sit to Stand: Supervision Stand pivot transfers: Supervision       General transfer comment: supervision for safety     Balance Overall balance assessment: Needs assistance   Sitting balance-Leahy Scale: Good     Standing balance support: No upper extremity supported;Single extremity supported;During functional activity Standing balance-Leahy Scale: Fair Standing balance comment: able to stand for self care with 1 hand support, relaint on B UE support dynamically                           ADL either performed or assessed with clinical judgement   ADL Overall ADL's : Needs assistance/impaired              Lower Body Bathing: Set up;Sit to/from stand       Lower Body Dressing: Set up;Sit to/from stand   Toilet Transfer: Supervision/safety;BSC;Stand-pivot   Toileting- Water quality scientist and Hygiene: Set up;Sit to/from stand       Functional mobility during ADLs: Supervision/safety General ADL Comments: pt remains anxious with mobility, continued energy conservation and PLB education during self care tasks      Vision       Perception     Praxis      Cognition Arousal/Alertness: Awake/alert Behavior During Therapy: Anxious Overall Cognitive Status: Within Functional Limits for tasks assessed                                          Exercises     Shoulder Instructions       General Comments Pt on 3L O2 with saturations maintained     Pertinent Vitals/ Pain       Pain Assessment: No/denies pain  Home Living                                          Prior Functioning/Environment              Frequency  Min 2X/week  Progress Toward Goals  OT Goals(current goals can now be found in the care plan section)  Progress towards OT goals: Progressing toward goals  Acute Rehab OT Goals Patient Stated Goal: breathe better OT Goal Formulation: With patient  Plan Discharge plan remains appropriate;Frequency remains appropriate    Co-evaluation                 AM-PAC OT "6 Clicks" Daily Activity     Outcome Measure   Help from another person eating meals?: None Help from another person taking care of personal grooming?: None Help from another person toileting, which includes using toliet, bedpan, or urinal?: A Little Help from another person bathing (including washing, rinsing, drying)?: None Help from another person to put on and taking off regular upper body clothing?: None Help from another person to put on and taking off regular lower body clothing?: None 6 Click Score: 23    End of Session  Equipment Utilized During Treatment: Oxygen(3L)  OT Visit Diagnosis: Other (comment);Other abnormalities of gait and mobility (R26.89)(decreased activity tolerance)   Activity Tolerance Patient tolerated treatment well   Patient Left in chair;with call bell/phone within reach   Nurse Communication Mobility status        Time: 1115-1130 OT Time Calculation (min): 15 min  Charges: OT General Charges $OT Visit: 1 Visit OT Treatments $Self Care/Home Management : 8-22 mins  Delight Stare, OT Acute Rehabilitation Services Pager [phone] Office [phone]    Delight Stare 06/20/2019, 11:51 AM

## 2019-06-20 NOTE — Progress Notes (Signed)
   Subjective: Pt seen at the bedside this morning. Pt states she is feeling well. Denies shortness of breath, cough, or chest pain.  Objective:  Vital signs in last 24 hours: Vitals:   06/19/19 2220 06/19/19 2326 06/20/19 0000 06/20/19 0335  BP:  103/64  (!) 133/93  Pulse: 91   84  Resp: 16 17  (!) 23  Temp:  98.5 F (36.9 C)  98.1 F (36.7 C)  TempSrc:  Oral  Oral  SpO2: 96%  93% 98%  Weight:      Height:       Physical Exam Vitals signs and nursing note reviewed.  Constitutional:      General: She is not in acute distress. Cardiovascular:     Rate and Rhythm: Normal rate and regular rhythm.     Heart sounds: Normal heart sounds. No murmur. No friction rub. No gallop.   Pulmonary:     Effort: Pulmonary effort is normal. No tachypnea or respiratory distress.     Breath sounds: Decreased air movement present. No wheezing, rhonchi or rales.  Musculoskeletal:     Right lower leg: No edema.     Left lower leg: No edema.  Neurological:     Mental Status: She is alert.      Assessment/Plan:  Assessment - Pt is a 68 year old F with history of COPD with multiple prior hospitalizations for exacerbations, 200 pack year smoking history, L lower lung lobectomy due to cancer in 2017, ESRD on HD (MWF), HTN, HLD, STEMI w/ stent placement x2, Afib w/ RVR, HFrEF (EF 40% on 02/25/19) who presented for shortness of breath, most likely due to COPD exacerbation secondary to CAP.   1. COPD Exacerbation likely secondary to CAP - Shortness of breath improved. Currently on home O2 2-3L. No wheezing on respiratory exam. - will order for last prednisone 40mg  tablet tomorrow (received today's dose this morning) - duonebs (ipratropium-albuterol) q4hr at home - ordered and encouraged tessalon for her cough   2. RLL Pneumonia - R lower lung opacity seen on CXR, "represent combination of scarring and pneumonia". Leukocytosis on admission now resolved. - azithromycin discontinued today - will order  for 1 dose 300mg  Omnicef tomorrow after dialysis to complete CAP coverage   3. Ischemic Cardiomyopathy - Initial troponin elevation appeared to be due to demand ischemia in the setting of COPD exacerbation, CAP, and ESRD. Echo stable. No signs of ischemia on EKG. Chest pain is pleuritic in nature.  Pt will follow-up with cardiology outpatient.  - continue ASA 81mg  daily  - continue metoprolol 50mg  daily and plavix 75mg  daily   4. ESRD on HD (MWF) - Pt received dialysis yesterday. - continue HD MWF - continue home Sensipar 30mg  MWF Nephrology following, appreciate recommendations.   5. Macrocytic anemia without signs of acute bleeding - Pt is s/p 1 unit pRBCs on 8/2. Hgb is appears chronically low with baseline 9-10?[name] MCV consistently elevated >101. - elevated ferratin with normal B12, folate, iron, and TIBC - no signs of acute bleeding, pt remains hemodynamically stable - will follow-up CBC before discharge to check Hgb   6. HTN - isosorbide mononitrate 30mg  qhs   7. HLD - atorvastatin 40mg  daily  8. Insomnia - continue home seroquel 200mg  qhs    Diet - Renal Diet DVT ppx - heparin 5,000 units subq q8h Fluids - none CODE STATUS - FULL CODE   Dispo: Delana Meyer, MD 06/20/2019, 6:15 AM Pager: [phone]

## 2019-06-21 LAB — TYPE AND SCREEN
ABO/RH(D): O POS
Antibody Screen: NEGATIVE
Unit division: 0
Unit division: 0

## 2019-06-21 LAB — BPAM RBC
Blood Product Expiration Date: 202008282359
Blood Product Expiration Date: 202008282359
ISSUE DATE / TIME: 202008020901
Unit Type and Rh: 5100
Unit Type and Rh: 5100

## 2019-06-28 ENCOUNTER — Emergency Department (HOSPITAL_COMMUNITY): Payer: Medicare Other

## 2019-06-28 ENCOUNTER — Inpatient Hospital Stay (HOSPITAL_COMMUNITY)
Admission: EM | Admit: 2019-06-28 | Discharge: 2019-06-29 | DRG: 190 | Disposition: A | Discharge status: Home Health | Payer: Medicare Other | Attending: Internal Medicine | Admitting: Internal Medicine

## 2019-06-28 ENCOUNTER — Encounter (HOSPITAL_COMMUNITY): Payer: Self-pay | Admitting: Emergency Medicine

## 2019-06-28 ENCOUNTER — Other Ambulatory Visit: Payer: Self-pay

## 2019-06-28 DIAGNOSIS — D509 Iron deficiency anemia, unspecified: Secondary | ICD-10-CM

## 2019-06-28 DIAGNOSIS — I2510 Atherosclerotic heart disease of native coronary artery without angina pectoris: Secondary | ICD-10-CM | POA: Diagnosis present

## 2019-06-28 DIAGNOSIS — Z888 Allergy status to other drugs, medicaments and biological substances status: Secondary | ICD-10-CM

## 2019-06-28 DIAGNOSIS — I255 Ischemic cardiomyopathy: Secondary | ICD-10-CM | POA: Diagnosis present

## 2019-06-28 DIAGNOSIS — E7800 Pure hypercholesterolemia, unspecified: Secondary | ICD-10-CM | POA: Diagnosis present

## 2019-06-28 DIAGNOSIS — E8889 Other specified metabolic disorders: Secondary | ICD-10-CM | POA: Diagnosis present

## 2019-06-28 DIAGNOSIS — G4700 Insomnia, unspecified: Secondary | ICD-10-CM | POA: Diagnosis present

## 2019-06-28 DIAGNOSIS — Z20828 Contact with and (suspected) exposure to other viral communicable diseases: Secondary | ICD-10-CM | POA: Diagnosis present

## 2019-06-28 DIAGNOSIS — I4891 Unspecified atrial fibrillation: Secondary | ICD-10-CM | POA: Diagnosis not present

## 2019-06-28 DIAGNOSIS — D631 Anemia in chronic kidney disease: Secondary | ICD-10-CM | POA: Diagnosis present

## 2019-06-28 DIAGNOSIS — F419 Anxiety disorder, unspecified: Secondary | ICD-10-CM

## 2019-06-28 DIAGNOSIS — J9611 Chronic respiratory failure with hypoxia: Secondary | ICD-10-CM | POA: Diagnosis present

## 2019-06-28 DIAGNOSIS — E1122 Type 2 diabetes mellitus with diabetic chronic kidney disease: Secondary | ICD-10-CM | POA: Diagnosis present

## 2019-06-28 DIAGNOSIS — Z79899 Other long term (current) drug therapy: Secondary | ICD-10-CM

## 2019-06-28 DIAGNOSIS — Z7902 Long term (current) use of antithrombotics/antiplatelets: Secondary | ICD-10-CM

## 2019-06-28 DIAGNOSIS — Z9115 Patient's noncompliance with renal dialysis: Secondary | ICD-10-CM | POA: Diagnosis not present

## 2019-06-28 DIAGNOSIS — J209 Acute bronchitis, unspecified: Secondary | ICD-10-CM

## 2019-06-28 DIAGNOSIS — I5042 Chronic combined systolic (congestive) and diastolic (congestive) heart failure: Secondary | ICD-10-CM | POA: Diagnosis present

## 2019-06-28 DIAGNOSIS — R197 Diarrhea, unspecified: Secondary | ICD-10-CM | POA: Diagnosis present

## 2019-06-28 DIAGNOSIS — D539 Nutritional anemia, unspecified: Secondary | ICD-10-CM | POA: Diagnosis present

## 2019-06-28 DIAGNOSIS — N2581 Secondary hyperparathyroidism of renal origin: Secondary | ICD-10-CM | POA: Diagnosis present

## 2019-06-28 DIAGNOSIS — R0902 Hypoxemia: Secondary | ICD-10-CM | POA: Diagnosis present

## 2019-06-28 DIAGNOSIS — Z955 Presence of coronary angioplasty implant and graft: Secondary | ICD-10-CM

## 2019-06-28 DIAGNOSIS — Z885 Allergy status to narcotic agent status: Secondary | ICD-10-CM

## 2019-06-28 DIAGNOSIS — I5020 Unspecified systolic (congestive) heart failure: Secondary | ICD-10-CM | POA: Diagnosis not present

## 2019-06-28 DIAGNOSIS — E785 Hyperlipidemia, unspecified: Secondary | ICD-10-CM | POA: Diagnosis present

## 2019-06-28 DIAGNOSIS — I252 Old myocardial infarction: Secondary | ICD-10-CM

## 2019-06-28 DIAGNOSIS — J9690 Respiratory failure, unspecified, unspecified whether with hypoxia or hypercapnia: Secondary | ICD-10-CM | POA: Diagnosis present

## 2019-06-28 DIAGNOSIS — Z9981 Dependence on supplemental oxygen: Secondary | ICD-10-CM | POA: Diagnosis not present

## 2019-06-28 DIAGNOSIS — J441 Chronic obstructive pulmonary disease with (acute) exacerbation: Secondary | ICD-10-CM | POA: Diagnosis present

## 2019-06-28 DIAGNOSIS — F411 Generalized anxiety disorder: Secondary | ICD-10-CM | POA: Diagnosis present

## 2019-06-28 DIAGNOSIS — Z87891 Personal history of nicotine dependence: Secondary | ICD-10-CM

## 2019-06-28 DIAGNOSIS — E1151 Type 2 diabetes mellitus with diabetic peripheral angiopathy without gangrene: Secondary | ICD-10-CM | POA: Diagnosis present

## 2019-06-28 DIAGNOSIS — E875 Hyperkalemia: Secondary | ICD-10-CM | POA: Diagnosis present

## 2019-06-28 DIAGNOSIS — Z7982 Long term (current) use of aspirin: Secondary | ICD-10-CM

## 2019-06-28 DIAGNOSIS — Z85118 Personal history of other malignant neoplasm of bronchus and lung: Secondary | ICD-10-CM | POA: Diagnosis not present

## 2019-06-28 DIAGNOSIS — Z825 Family history of asthma and other chronic lower respiratory diseases: Secondary | ICD-10-CM

## 2019-06-28 DIAGNOSIS — N186 End stage renal disease: Secondary | ICD-10-CM

## 2019-06-28 DIAGNOSIS — N19 Unspecified kidney failure: Secondary | ICD-10-CM

## 2019-06-28 DIAGNOSIS — J440 Chronic obstructive pulmonary disease with acute lower respiratory infection: Secondary | ICD-10-CM

## 2019-06-28 DIAGNOSIS — I132 Hypertensive heart and chronic kidney disease with heart failure and with stage 5 chronic kidney disease, or end stage renal disease: Secondary | ICD-10-CM

## 2019-06-28 DIAGNOSIS — Z7951 Long term (current) use of inhaled steroids: Secondary | ICD-10-CM

## 2019-06-28 DIAGNOSIS — Z992 Dependence on renal dialysis: Secondary | ICD-10-CM

## 2019-06-28 HISTORY — DX: Dyspnea, unspecified: R06.00

## 2019-06-28 LAB — CBC WITH DIFFERENTIAL/PLATELET
Abs Immature Granulocytes: 0.08 10*3/uL — ABNORMAL HIGH (ref 0.00–0.07)
Basophils Absolute: 0.1 10*3/uL (ref 0.0–0.1)
Basophils Relative: 1 %
Eosinophils Absolute: 0.5 10*3/uL (ref 0.0–0.5)
Eosinophils Relative: 7 %
HCT: 29.2 % — ABNORMAL LOW (ref 36.0–46.0)
Hemoglobin: 8.9 g/dL — ABNORMAL LOW (ref 12.0–15.0)
Immature Granulocytes: 1 %
Lymphocytes Relative: 12 %
Lymphs Abs: 0.8 10*3/uL (ref 0.7–4.0)
MCH: 34.2 pg — ABNORMAL HIGH (ref 26.0–34.0)
MCHC: 30.5 g/dL (ref 30.0–36.0)
MCV: 112.3 fL — ABNORMAL HIGH (ref 80.0–100.0)
Monocytes Absolute: 0.7 10*3/uL (ref 0.1–1.0)
Monocytes Relative: 10 %
Neutro Abs: 5 10*3/uL (ref 1.7–7.7)
Neutrophils Relative %: 69 %
Platelets: 223 10*3/uL (ref 150–400)
RBC: 2.6 MIL/uL — ABNORMAL LOW (ref 3.87–5.11)
RDW: 18.4 % — ABNORMAL HIGH (ref 11.5–15.5)
WBC: 7.2 10*3/uL (ref 4.0–10.5)
nRBC: 0 % (ref 0.0–0.2)

## 2019-06-28 LAB — MAGNESIUM: Magnesium: 2.6 mg/dL — ABNORMAL HIGH (ref 1.7–2.4)

## 2019-06-28 LAB — BASIC METABOLIC PANEL
Anion gap: 16 — ABNORMAL HIGH (ref 5–15)
BUN: 112 mg/dL — ABNORMAL HIGH (ref 8–23)
CO2: 14 mmol/L — ABNORMAL LOW (ref 22–32)
Calcium: 9.6 mg/dL (ref 8.9–10.3)
Chloride: 111 mmol/L (ref 98–111)
Creatinine, Ser: 11.26 mg/dL — ABNORMAL HIGH (ref 0.44–1.00)
GFR calc Af Amer: 4 mL/min — ABNORMAL LOW (ref >60)
GFR calc non Af Amer: 3 mL/min — ABNORMAL LOW (ref >60)
Glucose, Bld: 103 mg/dL — ABNORMAL HIGH (ref 70–99)
Potassium: 5.2 mmol/L — ABNORMAL HIGH (ref 3.5–5.1)
Sodium: 141 mmol/L (ref 135–145)

## 2019-06-28 LAB — PHOSPHORUS: Phosphorus: 7.5 mg/dL — ABNORMAL HIGH (ref 2.5–4.6)

## 2019-06-28 LAB — SARS CORONAVIRUS 2 BY RT PCR (HOSPITAL ORDER, PERFORMED IN ~~LOC~~ HOSPITAL LAB): SARS Coronavirus 2: NEGATIVE

## 2019-06-28 MED ORDER — SENNOSIDES-DOCUSATE SODIUM 8.6-50 MG PO TABS: 1.0000 | ORAL_TABLET | Freq: Every evening | ORAL | Status: DC | PRN

## 2019-06-28 MED ORDER — MOMETASONE FURO-FORMOTEROL FUM 200-5 MCG/ACT IN AERO
2.0000 | INHALATION_SPRAY | Freq: Two times a day (BID) | RESPIRATORY_TRACT | Status: DC
  Administered 2019-06-28 – 2019-06-29 (×2): 2 via RESPIRATORY_TRACT
  Filled 2019-06-28: qty 8.8

## 2019-06-28 MED ORDER — QUETIAPINE FUMARATE 200 MG PO TABS
200.0000 mg | ORAL_TABLET | Freq: Every day | ORAL | Status: DC
  Administered 2019-06-28: 200 mg via ORAL
  Filled 2019-06-28 (×2): qty 1

## 2019-06-28 MED ORDER — METHYLPREDNISOLONE SODIUM SUCC 125 MG IJ SOLR
125.0000 mg | Freq: Once | INTRAMUSCULAR | Status: AC
  Administered 2019-06-28: 06:00:00 125 mg via INTRAVENOUS
  Filled 2019-06-28: qty 2

## 2019-06-28 MED ORDER — ALBUTEROL SULFATE HFA 108 (90 BASE) MCG/ACT IN AERS
4.0000 | INHALATION_SPRAY | Freq: Once | RESPIRATORY_TRACT | Status: AC
  Administered 2019-06-28: 05:00:00 4 via RESPIRATORY_TRACT
  Filled 2019-06-28: qty 6.7

## 2019-06-28 MED ORDER — RENA-VITE PO TABS
1.0000 | ORAL_TABLET | Freq: Every day | ORAL | Status: DC
  Administered 2019-06-28: 1 via ORAL
  Filled 2019-06-28: qty 1

## 2019-06-28 MED ORDER — DARBEPOETIN ALFA 60 MCG/0.3ML IJ SOSY
PREFILLED_SYRINGE | INTRAMUSCULAR | Status: AC
  Administered 2019-06-28: 12:00:00 60 ug via INTRAVENOUS
  Filled 2019-06-28: qty 0.3

## 2019-06-28 MED ORDER — CINACALCET HCL 30 MG PO TABS
30.0000 mg | ORAL_TABLET | ORAL | Status: DC
  Administered 2019-06-28: 30 mg via ORAL
  Filled 2019-06-28: qty 1

## 2019-06-28 MED ORDER — LOPERAMIDE HCL 2 MG PO CAPS: 2.0000 mg | ORAL_CAPSULE | ORAL | Status: DC | PRN

## 2019-06-28 MED ORDER — SODIUM CHLORIDE 0.9% FLUSH
3.0000 mL | Freq: Two times a day (BID) | INTRAVENOUS | Status: DC
  Administered 2019-06-28 – 2019-06-29 (×3): 3 mL via INTRAVENOUS

## 2019-06-28 MED ORDER — IPRATROPIUM-ALBUTEROL 0.5-2.5 (3) MG/3ML IN SOLN
RESPIRATORY_TRACT | Status: AC
  Filled 2019-06-28: qty 3

## 2019-06-28 MED ORDER — ONDANSETRON HCL 4 MG PO TABS: 4.0000 mg | ORAL_TABLET | Freq: Four times a day (QID) | ORAL | Status: DC | PRN

## 2019-06-28 MED ORDER — HEPARIN SODIUM (PORCINE) 5000 UNIT/ML IJ SOLN
5000.0000 [IU] | Freq: Three times a day (TID) | INTRAMUSCULAR | Status: DC
  Administered 2019-06-28 – 2019-06-29 (×4): 5000 [IU] via SUBCUTANEOUS
  Filled 2019-06-28 (×4): qty 1

## 2019-06-28 MED ORDER — FERRIC CITRATE 1 GM 210 MG(FE) PO TABS
210.0000 mg | ORAL_TABLET | ORAL | Status: DC
  Administered 2019-06-28: 210 mg via ORAL

## 2019-06-28 MED ORDER — DARBEPOETIN ALFA 60 MCG/0.3ML IJ SOSY
60.0000 ug | PREFILLED_SYRINGE | INTRAMUSCULAR | Status: DC
  Administered 2019-06-28: 12:00:00 60 ug via INTRAVENOUS

## 2019-06-28 MED ORDER — METOPROLOL SUCCINATE ER 25 MG PO TB24: 50.0000 mg | ORAL_TABLET | Freq: Every day | ORAL | Status: DC

## 2019-06-28 MED ORDER — ISOSORBIDE MONONITRATE ER 30 MG PO TB24
30.0000 mg | ORAL_TABLET | Freq: Every day | ORAL | Status: DC
  Administered 2019-06-28: 30 mg via ORAL
  Filled 2019-06-28: qty 1

## 2019-06-28 MED ORDER — ALBUTEROL SULFATE HFA 108 (90 BASE) MCG/ACT IN AERS
6.0000 | INHALATION_SPRAY | RESPIRATORY_TRACT | Status: AC
  Administered 2019-06-28 (×2): 6 via RESPIRATORY_TRACT

## 2019-06-28 MED ORDER — ASPIRIN EC 81 MG PO TBEC
81.0000 mg | DELAYED_RELEASE_TABLET | Freq: Every day | ORAL | Status: DC
  Administered 2019-06-28 – 2019-06-29 (×2): 81 mg via ORAL
  Filled 2019-06-28 (×2): qty 1

## 2019-06-28 MED ORDER — FERRIC CITRATE 1 GM 210 MG(FE) PO TABS
420.0000 mg | ORAL_TABLET | Freq: Three times a day (TID) | ORAL | Status: DC
  Administered 2019-06-28 – 2019-06-29 (×2): 420 mg via ORAL
  Filled 2019-06-28 (×4): qty 2

## 2019-06-28 MED ORDER — CHLORHEXIDINE GLUCONATE CLOTH 2 % EX PADS: 6.0000 | MEDICATED_PAD | Freq: Every day | CUTANEOUS | Status: DC

## 2019-06-28 MED ORDER — IPRATROPIUM-ALBUTEROL 0.5-2.5 (3) MG/3ML IN SOLN
3.0000 mL | Freq: Four times a day (QID) | RESPIRATORY_TRACT | Status: DC
  Administered 2019-06-28 – 2019-06-29 (×4): 3 mL via RESPIRATORY_TRACT
  Filled 2019-06-28 (×4): qty 3

## 2019-06-28 MED ORDER — ALBUTEROL SULFATE (2.5 MG/3ML) 0.083% IN NEBU
2.5000 mg | INHALATION_SOLUTION | RESPIRATORY_TRACT | Status: DC | PRN
  Administered 2019-06-29: 2.5 mg via RESPIRATORY_TRACT
  Filled 2019-06-28: qty 3

## 2019-06-28 MED ORDER — METOPROLOL SUCCINATE ER 50 MG PO TB24
50.0000 mg | ORAL_TABLET | Freq: Every day | ORAL | Status: DC
  Administered 2019-06-29: 50 mg via ORAL
  Filled 2019-06-28 (×2): qty 1

## 2019-06-28 MED ORDER — BUPROPION HCL ER (SR) 150 MG PO TB12: 150.0000 mg | ORAL_TABLET | Freq: Two times a day (BID) | ORAL | Status: DC

## 2019-06-28 MED ORDER — ACETAMINOPHEN 325 MG PO TABS: 650.0000 mg | ORAL_TABLET | Freq: Four times a day (QID) | ORAL | Status: DC | PRN

## 2019-06-28 MED ORDER — BUPROPION HCL ER (SR) 150 MG PO TB12
150.0000 mg | ORAL_TABLET | Freq: Two times a day (BID) | ORAL | Status: DC
  Administered 2019-06-28 – 2019-06-29 (×2): 150 mg via ORAL
  Filled 2019-06-28 (×4): qty 1

## 2019-06-28 MED ORDER — CLOPIDOGREL BISULFATE 75 MG PO TABS
75.0000 mg | ORAL_TABLET | Freq: Every day | ORAL | Status: DC
  Administered 2019-06-28 – 2019-06-29 (×2): 75 mg via ORAL
  Filled 2019-06-28 (×2): qty 1

## 2019-06-28 MED ORDER — ATORVASTATIN CALCIUM 40 MG PO TABS
40.0000 mg | ORAL_TABLET | Freq: Every day | ORAL | Status: DC
  Administered 2019-06-28 – 2019-06-29 (×2): 40 mg via ORAL
  Filled 2019-06-28 (×2): qty 1

## 2019-06-28 MED ORDER — ACETAMINOPHEN 650 MG RE SUPP: 650.0000 mg | Freq: Four times a day (QID) | RECTAL | Status: DC | PRN

## 2019-06-28 MED ORDER — UMECLIDINIUM BROMIDE 62.5 MCG/INH IN AEPB
1.0000 | INHALATION_SPRAY | Freq: Every day | RESPIRATORY_TRACT | Status: DC
  Administered 2019-06-28 – 2019-06-29 (×2): 1 via RESPIRATORY_TRACT
  Filled 2019-06-28: qty 7

## 2019-06-28 MED ORDER — DOXYCYCLINE HYCLATE 100 MG PO TABS
100.0000 mg | ORAL_TABLET | Freq: Once | ORAL | Status: AC
  Administered 2019-06-28: 06:00:00 100 mg via ORAL
  Filled 2019-06-28: qty 1

## 2019-06-28 MED ORDER — PREDNISONE 20 MG PO TABS
40.0000 mg | ORAL_TABLET | Freq: Every day | ORAL | Status: DC
  Administered 2019-06-29: 40 mg via ORAL
  Filled 2019-06-28 (×2): qty 2

## 2019-06-28 MED ORDER — ONDANSETRON HCL 4 MG/2ML IJ SOLN: 4.0000 mg | Freq: Four times a day (QID) | INTRAMUSCULAR | Status: DC | PRN

## 2019-06-28 NOTE — H&P (Signed)
Date: 06/28/2019               Patient Name:  Kari Robinson MRN: [id number]  DOB: 1951-02-08 Age / Sex: 68 y.o., female   PCP: Tsosie Billing, MD (Inactive)         Medical Service: Internal Medicine Teaching Service         Attending Physician: Dr. Sid Falcon, MD    First Contact: Dr. Ronnald Ramp Pager: [phone]  Second Contact: Dr. Shan Levans Pager: [phone]       After Hours (After 5p/  First Contact Pager: [phone]  weekends / holidays): Second Contact Pager: [phone]   Chief Complaint: Dyspnea  History of Present Illness: Kari Robinson is a 68 yo F w. Hx of HTN, CAD, PVD, CHF, ESRD on HD MWF, COPD, and DM who presented with on going shortness of breath. She reports some ongoing dyspnea since discharge that has gradually been getting worse, now with audible wheezing. She denies change in her cough or sputum production. She states  She reports she has tried her home nebulizer with transient relief. She reports ongoing loose stools that were present at her prior admission that have improved to just 1-2 times per day. She reports some mild left sided abdominal pain and that she had darker stools a couple of weeks ago that have resolved and she was told it was due to an injection she was given. Sh also reports 1-2 episodes of light headedness per week, not associated with standing or palpitations. She states that due to her loose stools and some weakness she has not been to HD since discharge. She denies fevers, Chills, Palpitation, chest pain, Nausea, vomiting, constipation or Abdominal pain.  ED workup significant for K 5.2, BUN 112, Cr 11, CO2- 14, GAP 16 (All consistent with ESRD having missed HD); Hgb 8.9; Ventricular trigeminy on EKG and Stable CXR.  Meds:  Current Meds  Medication Sig  . albuterol (PROVENTIL HFA;VENTOLIN HFA) 108 (90 Base) MCG/ACT inhaler Inhale 2 puffs into the lungs every 4 (four) hours as needed for wheezing.   [name] aspirin EC 81 MG tablet Take 81 mg by  mouth daily.  [name] atorvastatin (LIPITOR) 40 MG tablet Take 40 mg by mouth daily.  Lorin Picket 1 GM 210 MG(Fe) tablet Take 210-420 mg by mouth See admin instructions. Take 2 tablets (420mg ) by mouth three times daily with meals, and 1 tablet (210mg ) by mouth with snacks  . b complex-vitamin c-folic acid (NEPHRO-VITE) 0.8 MG TABS tablet Take 1 tablet by mouth See admin instructions. Take one tablet by mouth on Sunday, Tuesday, Thursday, Saturday mornings. Take one tablet after dialysis on Monday, Wednesday, Friday  . budesonide-formoterol (SYMBICORT) 160-4.5 MCG/ACT inhaler Inhale 2 puffs into the lungs 2 (two) times daily.  [name] buPROPion (WELLBUTRIN SR) 150 MG 12 hr tablet Take 150 mg by mouth 2 (two) times daily.  . cinacalcet (SENSIPAR) 30 MG tablet Take 1 tablet (30 mg total) by mouth every Monday, Wednesday, and Friday at 6 PM.  . clopidogrel (PLAVIX) 75 MG tablet Take 75 mg by mouth daily.  [name] ipratropium-albuterol (DUONEB) 0.5-2.5 (3) MG/3ML SOLN Take 3 mLs by nebulization every 6 (six) hours as needed. (Patient taking differently: Take 3 mLs by nebulization every 6 (six) hours as needed (for wheezing). )  . lidocaine-prilocaine (EMLA) cream Apply 1 application topically every Monday, Wednesday, and Friday. Prior to dialysis  . metoprolol succinate (TOPROL-XL) 50 MG 24 hr tablet Take 1 tablet (50  mg total) by mouth daily. Take with or immediately following a meal.  . SEROQUEL 200 MG tablet Take 200 mg by mouth at bedtime.  [name] tiotropium (SPIRIVA HANDIHALER) 18 MCG inhalation capsule Place 1 capsule (18 mcg total) into inhaler and inhale daily.     Allergies: Allergies as of 06/28/2019 - Review Complete 06/28/2019  Allergen Reaction Noted  . Citalopram hydrobromide Hives   . Morphine and related Itching 09/30/2018   Past Medical History:  Diagnosis Date  . Alcohol abuse   . Arthralgia   . CAD (coronary artery disease)   . Chronic combined systolic and diastolic HF (heart failure) ([place])   .  COPD (chronic obstructive pulmonary disease) ([place])   . Depression   . ESRD (end stage renal disease) ([place])   . HTN (hypertension)   . Hypercholesterolemia    primarily ldl-p and small particles  . Lung cancer [facility])    Patient reports this with surgery.  No details.    [name] RAS (renal artery stenosis) ([place]) 2002   by cath tysinger  . Smoking     Family History:  Family History  Problem Relation Age of Onset  . Emphysema Father   Reviewed on admission  Social History:  Social History   Tobacco Use  . Smoking status: Former Smoker    Years: 35.00    Types: Cigarettes    Quit date: 05/16/2019    Years since quitting: 0.1  . Smokeless tobacco: Never Used  . Tobacco comment: 3-4 cigarettes per day  Substance Use Topics  . Alcohol use: Never    Frequency: Never    Comment: states has quit drinking "a few months ago"  . Drug use: Never  Reviewed on admssion  Review of Systems: A complete ROS was negative except as per HPI.  Physical Exam: Blood pressure (!) 156/96, pulse 94, resp. rate 17, SpO2 94 %. Physical Exam Constitutional:      Appearance: Normal appearance.     Comments: Elderly female in mild distress  HENT:     Head: Normocephalic and atraumatic.  Cardiovascular:     Rate and Rhythm: Normal rate and regular rhythm.     Pulses: Normal pulses.     Heart sounds: Normal heart sounds.  Pulmonary:     Breath sounds: Wheezing present.     Comments: Mild distress, but oxygenating well on room air when seen Diffuse wheezing, audible for several feet away Abdominal:     General: Bowel sounds are normal. There is no distension.     Palpations: Abdomen is soft.     Tenderness: There is no abdominal tenderness.  Musculoskeletal:        General: No swelling or deformity.  Skin:    General: Skin is warm and dry.  Neurological:     General: No focal deficit present.     Mental Status: Mental status is at baseline.  Psychiatric:        Mood and Affect: Mood normal.         Behavior: Behavior normal.    EKG: personally reviewed my interpretation is Sinus rhythm with new ventricular trigeminy  CXR: personally reviewed my interpretation is Rotated, Hyperinflated, stable interstial coarsening  Assessment & Plan by Problem: Active Problems:   Respiratory failure ([name])  COPD Exacerbation: Patient reports gradual worsening of symptoms since discharge. Reports she has been taking her medications. She states she has been using her nebulizer without lasting relief at home. Unclear if volume could be playing a role,  appear euvolemic and no rales hear (though profound wheezing could mask other breath sounds) No change in her chronic cough, no increased sputum. production. COVID negative. S/P albuterol, Doxy, and Solumedrol in ED. - Duonebs q6h - Albuterol q4h PRN - Prednisone 40mg  Daily - Dulera 2 puff BID - Spiriva Daily  ESRD on HD Hyperkalemia:  K 5.2, BUN 112, Cr 11, CO2- 14, GAP 16 in the setting of missing HD since discharge due to loose stools and generally feeling weaker per patient. Nephrology consulted in ED for HD. - Appreciate Nephrology recommendations - Continue Renal Vitamin - Continue Auryxia, and Sensipar (All consistent with ESRD having missed HD)  Macrocytic Anemia: Choinc Hgb stable at ~9 (was 10.7 at discharge, but not signs or sx of bleeding and possible dilutional component given missed HD). Previous admission showed elevated ferritin, normal Iron, TIBC, B12, and Folate  HTN: Continue home IMDUR and Metoprolol  CAD Ischemic Cardiomyopathy: Continue home ASA, Metoprolol, and Plavix  FEN: Replete lytes prn, Renal diet VTE ppx: Heparin Code Status: FULL   Dispo: Admit patient to Inpatient with expected length of stay greater than 2 midnights.  Signed: Neva Seat, MD 06/28/2019, 8:51 AM  Pager: [phone]
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
## 2019-06-28 NOTE — ED Notes (Signed)
Pt placed on 1.5L of O2 per request. sts "i'm on 3L 24/7 but I have not been on it since I have been here"   Pt o2 at 99% before 1.5L

## 2019-06-28 NOTE — ED Triage Notes (Signed)
Patient here with shortness of breath, just not feeling good.  She has not been to dialysis since 06/19/19.  Patient has not gone since she was admitted here on 06/16/19.  She is having back pain that shoots down her legs.

## 2019-06-28 NOTE — Progress Notes (Signed)
New Admission Note:   Arrival Method: Bed  Mental Orientation: Alert and oriented x4  Telemetry: none  Assessment: Completed Skin: intact redness on bottom  AF:OADL forearm SL  Pain: Tubes:none  Safety Measures: Safety Fall Prevention Plan has been given, discussed and signed Admission: Completed [facility] [place] Orientation: Patient has been orientated to the room, unit and staff.  Family: none   Orders have been reviewed and implemented. Will continue to monitor the patient. Call light has been placed within reach and bed alarm has been activated.   Kymir Coles RN [place] [facility] Phone: [phone]

## 2019-06-28 NOTE — ED Notes (Signed)
Admitting at bedside.  Breakfast ordered.

## 2019-06-28 NOTE — Progress Notes (Deleted)
Synopsis: Referred in August 2020 for COPD by No ref. provider found  Subjective:   PATIENT ID: Kari Robinson GENDER: female DOB: 21-Apr-1951, MRN: [id number]  No chief complaint on file.   HPI  ***  Past Medical History:  Diagnosis Date  . Alcohol abuse   . Arthralgia   . CAD (coronary artery disease)   . Chronic combined systolic and diastolic HF (heart failure) ([place])   . COPD (chronic obstructive pulmonary disease) ([place])   . Depression   . Dyspnea   . ESRD (end stage renal disease) ([place])   . HTN (hypertension)   . Hypercholesterolemia    primarily ldl-p and small particles  . Lung cancer [facility])    Patient reports this with surgery.  No details.    [name] RAS (renal artery stenosis) ([place]) 2002   by cath tysinger  . Smoking      Family History  Problem Relation Age of Onset  . Emphysema Father      Past Surgical History:  Procedure Laterality Date  . abdominal aortogram     perclose of the right femoral artery  . AV FISTULA PLACEMENT Right 07/11/2018   Procedure: Right arm ARTERIOVENOUS FISTULA CREATION;  Surgeon: [name], MD;  Location: [place];  Service: Vascular;  Laterality: Right;  . AVF placement Right   . CARDIAC CATHETERIZATION     left heart catheterization.  Coronary cineangiography. Lft ventricular cineangiography.    [name] LUNG SURGERY     due to lung cancer per patient's report  . TUBAL LIGATION      Social History   Socioeconomic History  . Marital status: Single    Spouse name: Not on file  . Number of children: Not on file  . Years of education: Not on file  . Highest education level: Not on file  Occupational History  . Not on file  Social Needs  . Financial resource strain: Not on file  . Food insecurity    Worry: Not on file    Inability: Not on file  . Transportation needs    Medical: Not on file    Non-medical: Not on file  Tobacco Use  . Smoking status: Former Smoker    Years: 35.00    Types: Cigarettes    Quit date:  05/16/2019    Years since quitting: 0.1  . Smokeless tobacco: Never Used  . Tobacco comment: 3-4 cigarettes per day  Substance and Sexual Activity  . Alcohol use: Never    Frequency: Never    Comment: states has quit drinking "a few months ago"  . Drug use: Never  . Sexual activity: Not on file  Lifestyle  . Physical activity    Days per week: Not on file    Minutes per session: Not on file  . Stress: Not on file  Relationships  . Social Herbalist on phone: Not on file    Gets together: Not on file    Attends religious service: Not on file    Active member of club or organization: Not on file    Attends meetings of clubs or organizations: Not on file    Relationship status: Not on file  . Intimate partner violence    Fear of current or ex partner: Not on file    Emotionally abused: Not on file    Physically abused: Not on file    Forced sexual activity: Not on file  Other Topics Concern  . Not  on file  Social History Narrative   ** Merged History Encounter **         Allergies  Allergen Reactions  . Citalopram Hydrobromide Hives  . Morphine And Related Itching     Facility-Administered Medications Prior to Visit  Medication Dose Route Frequency Provider Last Rate Last Dose  . acetaminophen (TYLENOL) tablet 650 mg  650 mg Oral Q6H PRN Neva Seat, MD       Or  . acetaminophen (TYLENOL) suppository 650 mg  650 mg Rectal Q6H PRN Neva Seat, MD      . albuterol (PROVENTIL) (2.5 MG/3ML) 0.083% nebulizer solution 2.5 mg  2.5 mg Nebulization Q4H PRN Neva Seat, MD      . aspirin EC tablet 81 mg  81 mg Oral Daily Neva Seat, MD   81 mg at 06/28/19 1435  . atorvastatin (LIPITOR) tablet 40 mg  40 mg Oral Daily Neva Seat, MD   40 mg at 06/28/19 1435  . buPROPion (WELLBUTRIN SR) 12 hr tablet 150 mg  150 mg Oral BID Neva Seat, MD      . Chlorhexidine Gluconate Cloth 2 % PADS 6 each  6 each Topical Q0600 Janalee Dane,  PA-C      . cinacalcet (SENSIPAR) tablet 30 mg  30 mg Oral Q M,W,F-1800 Neva Seat, MD      . clopidogrel (PLAVIX) tablet 75 mg  75 mg Oral Daily Neva Seat, MD   75 mg at 06/28/19 1436  . Darbepoetin Alfa (ARANESP) injection 60 mcg  60 mcg Intravenous Q Wed-HD Anice Paganini G, PA-C   60 mcg at 06/28/19 1227  . ferric citrate (AURYXIA) tablet 210 mg  210 mg Oral With snacks Gilles Chiquito B, MD      . ferric citrate (AURYXIA) tablet 420 mg  420 mg Oral TID WC Neva Seat, MD      . heparin injection 5,000 Units  5,000 Units Subcutaneous Q8H Neva Seat, MD   5,000 Units at 06/28/19 1436  . ipratropium-albuterol (DUONEB) 0.5-2.5 (3) MG/3ML nebulizer solution 3 mL  3 mL Nebulization Q6H Neva Seat, MD   3 mL at 06/28/19 1432  . isosorbide mononitrate (IMDUR) 24 hr tablet 30 mg  30 mg Oral QHS Neva Seat, MD      . loperamide (IMODIUM) capsule 2 mg  2 mg Oral PRN Neva Seat, MD      . Derrill Memo ON 06/29/2019] metoprolol succinate (TOPROL-XL) 24 hr tablet 50 mg  50 mg Oral Daily Neva Seat, MD      . mometasone-formoterol [facility]) 200-5 MCG/ACT inhaler 2 puff  2 puff Inhalation BID Sid Falcon, MD   2 puff at 06/28/19 1514  . multivitamin (RENA-VIT) tablet 1 tablet  1 tablet Oral QHS Neva Seat, MD      . ondansetron [facility]) tablet 4 mg  4 mg Oral Q6H PRN Neva Seat, MD       Or  . ondansetron [facility]) injection 4 mg  4 mg Intravenous Q6H PRN Neva Seat, MD      . Derrill Memo ON 06/29/2019] predniSONE (DELTASONE) tablet 40 mg  40 mg Oral Q breakfast Neva Seat, MD      . QUEtiapine (SEROQUEL) tablet 200 mg  200 mg Oral QHS Neva Seat, MD      . senna-docusate (Senokot-S) tablet 1 tablet  1 tablet Oral QHS PRN Neva Seat, MD      . sodium chloride flush (NS) 0.9 % injection 3 mL  3 mL Intravenous Q12H  Neva Seat, MD   3 mL at 06/28/19 1440  . umeclidinium bromide (INCRUSE ELLIPTA) 62.5 MCG/INH 1 puff   1 puff Inhalation Daily Neva Seat, MD   1 puff at 06/28/19 1513   Outpatient Medications Prior to Visit  Medication Sig Dispense Refill  . albuterol (PROVENTIL HFA;VENTOLIN HFA) 108 (90 Base) MCG/ACT inhaler Inhale 2 puffs into the lungs every 4 (four) hours as needed for wheezing.     [name] aspirin EC 81 MG tablet Take 81 mg by mouth daily.    [name] atorvastatin (LIPITOR) 40 MG tablet Take 40 mg by mouth daily.    Lorin Picket 1 GM 210 MG(Fe) tablet Take 210-420 mg by mouth See admin instructions. Take 2 tablets (420mg ) by mouth three times daily with meals, and 1 tablet (210mg ) by mouth with snacks    . b complex-vitamin c-folic acid (NEPHRO-VITE) 0.8 MG TABS tablet Take 1 tablet by mouth See admin instructions. Take one tablet by mouth on Sunday, Tuesday, Thursday, Saturday mornings. Take one tablet after dialysis on Monday, Wednesday, Friday    . budesonide-formoterol (SYMBICORT) 160-4.5 MCG/ACT inhaler Inhale 2 puffs into the lungs 2 (two) times daily.    [name] buPROPion (WELLBUTRIN SR) 150 MG 12 hr tablet Take 150 mg by mouth 2 (two) times daily.    . cinacalcet (SENSIPAR) 30 MG tablet Take 1 tablet (30 mg total) by mouth every Monday, Wednesday, and Friday at 6 PM. 60 tablet 0  . clopidogrel (PLAVIX) 75 MG tablet Take 75 mg by mouth daily.    [name] ipratropium-albuterol (DUONEB) 0.5-2.5 (3) MG/3ML SOLN Take 3 mLs by nebulization every 6 (six) hours as needed. (Patient taking differently: Take 3 mLs by nebulization every 6 (six) hours as needed (for wheezing). ) 360 mL 0  . isosorbide mononitrate (IMDUR) 30 MG 24 hr tablet Take 30 mg by mouth at bedtime.     . lidocaine-prilocaine (EMLA) cream Apply 1 application topically every Monday, Wednesday, and Friday. Prior to dialysis    . metoprolol succinate (TOPROL-XL) 50 MG 24 hr tablet Take 1 tablet (50 mg total) by mouth daily. Take with or immediately following a meal. 30 tablet 0  . OXYGEN Inhale 2 L into the lungs continuous.    . SEROQUEL 200 MG  tablet Take 200 mg by mouth at bedtime.    [name] tiotropium (SPIRIVA HANDIHALER) 18 MCG inhalation capsule Place 1 capsule (18 mcg total) into inhaler and inhale daily. 30 capsule 2    ROS   Objective:  Physical Exam   There were no vitals filed for this visit.   on *** LPM *** RA BMI Readings from Last 3 Encounters:  06/19/19 25.92 kg/m  05/10/19 26.19 kg/m  04/01/19 25.62 kg/m   Wt Readings from Last 3 Encounters:  06/19/19 151 lb 0.2 oz (68.5 kg)  05/10/19 152 lb 8.9 oz (69.2 kg)  04/01/19 149 lb 4 oz (67.7 kg)     CBC    Component Value Date/Time   WBC 7.2 06/28/2019 0437   RBC 2.60 (L) 06/28/2019 0437   HGB 8.9 (L) 06/28/2019 0437   HCT 29.2 (L) 06/28/2019 0437   HCT 26.0 (L) 06/17/2019 1128   PLT 223 06/28/2019 0437   MCV 112.3 (H) 06/28/2019 0437   MCH 34.2 (H) 06/28/2019 0437   MCHC 30.5 06/28/2019 0437   RDW 18.4 (H) 06/28/2019 0437   LYMPHSABS 0.8 06/28/2019 0437   MONOABS 0.7 06/28/2019 0437   EOSABS 0.5 06/28/2019 0437   BASOSABS 0.1  06/28/2019 0437    ***  Chest Imaging: ***  Pulmonary Functions Testing Results: No flowsheet data found.  FeNO: ***  Pathology: ***  Echocardiogram: ***  Heart Catheterization: ***    Assessment & Plan:   No diagnosis found.  Discussion: ***  No current facility-administered medications for this visit.  No current outpatient medications on file.  Facility-Administered Medications Ordered in Other Visits:  .  acetaminophen (TYLENOL) tablet 650 mg, 650 mg, Oral, Q6H PRN **OR** acetaminophen (TYLENOL) suppository 650 mg, 650 mg, Rectal, Q6H PRN, Neva Seat, MD .  albuterol (PROVENTIL) (2.5 MG/3ML) 0.083% nebulizer solution 2.5 mg, 2.5 mg, Nebulization, Q4H PRN, Neva Seat, MD .  aspirin EC tablet 81 mg, 81 mg, Oral, Daily, Neva Seat, MD, 81 mg at 06/28/19 1435 .  atorvastatin (LIPITOR) tablet 40 mg, 40 mg, Oral, Daily, Neva Seat, MD, 40 mg at 06/28/19 1435 .  buPROPion  (WELLBUTRIN SR) 12 hr tablet 150 mg, 150 mg, Oral, BID, Neva Seat, MD .  Chlorhexidine Gluconate Cloth 2 % PADS 6 each, 6 each, Topical, Q0600, Collins, Samantha G, PA-C .  cinacalcet (SENSIPAR) tablet 30 mg, 30 mg, Oral, Q M,W,F-1800, Neva Seat, MD .  clopidogrel (PLAVIX) tablet 75 mg, 75 mg, Oral, Daily, Neva Seat, MD, 75 mg at 06/28/19 1436 .  Darbepoetin Alfa (ARANESP) injection 60 mcg, 60 mcg, Intravenous, Q Wed-HD, Collins, Samantha G, PA-C, 60 mcg at 06/28/19 1227 .  ferric citrate (AURYXIA) tablet 210 mg, 210 mg, Oral, With snacks, Gilles Chiquito B, MD .  ferric citrate (AURYXIA) tablet 420 mg, 420 mg, Oral, TID WC, Neva Seat, MD .  heparin injection 5,000 Units, 5,000 Units, Subcutaneous, Q8H, Neva Seat, MD, 5,000 Units at 06/28/19 1436 .  ipratropium-albuterol (DUONEB) 0.5-2.5 (3) MG/3ML nebulizer solution 3 mL, 3 mL, Nebulization, Q6H, Neva Seat, MD, 3 mL at 06/28/19 1432 .  ipratropium-albuterol (DUONEB) 0.5-2.5 (3) MG/3ML nebulizer solution, , , ,  .  isosorbide mononitrate (IMDUR) 24 hr tablet 30 mg, 30 mg, Oral, QHS, Neva Seat, MD .  loperamide (IMODIUM) capsule 2 mg, 2 mg, Oral, PRN, Neva Seat, MD .  Derrill Memo ON 06/29/2019] metoprolol succinate (TOPROL-XL) 24 hr tablet 50 mg, 50 mg, Oral, Daily, Neva Seat, MD .  mometasone-formoterol [facility]) 200-5 MCG/ACT inhaler 2 puff, 2 puff, Inhalation, BID, Sid Falcon, MD, 2 puff at 06/28/19 1514 .  multivitamin (RENA-VIT) tablet 1 tablet, 1 tablet, Oral, QHS, Neva Seat, MD .  ondansetron [facility]) tablet 4 mg, 4 mg, Oral, Q6H PRN **OR** ondansetron (ZOFRAN) injection 4 mg, 4 mg, Intravenous, Q6H PRN, Neva Seat, MD .  Derrill Memo ON 06/29/2019] predniSONE (DELTASONE) tablet 40 mg, 40 mg, Oral, Q breakfast, Neva Seat, MD .  QUEtiapine (SEROQUEL) tablet 200 mg, 200 mg, Oral, QHS, Neva Seat, MD .  senna-docusate (Senokot-S) tablet 1 tablet, 1 tablet,  Oral, QHS PRN, Neva Seat, MD .  sodium chloride flush (NS) 0.9 % injection 3 mL, 3 mL, Intravenous, Q12H, Neva Seat, MD, 3 mL at 06/28/19 1440 .  umeclidinium bromide (INCRUSE ELLIPTA) 62.5 MCG/INH 1 puff, 1 puff, Inhalation, Daily, Neva Seat, MD, 1 puff at 06/28/19 [place], DO [facility] 06/28/2019 5:35 PM

## 2019-06-28 NOTE — Procedures (Signed)
I was present at this dialysis session. I have reviewed the session itself and made appropriate changes.   Vital signs in last 24 hours:  Temp:  [98.4 F (36.9 C)] 98.4 F (36.9 C) (08/12 1015) Pulse Rate:  [32-107] 104 (08/12 1230) Resp:  [15-22] 21 (08/12 1230) BP: (123-178)/(80-116) 168/98 (08/12 1230) SpO2:  [85 %-100 %] 96 % (08/12 1015) FiO2 (%):  [99 %] 99 % (08/12 0828) Weight change:  Filed Weights    Recent Labs  Lab 06/28/19 0437  NA 141  K 5.2*  CL 111  CO2 14*  GLUCOSE 103*  BUN 112*  CREATININE 11.26*  CALCIUM 9.6  PHOS 7.5*    Recent Labs  Lab 06/28/19 0437  WBC 7.2  NEUTROABS 5.0  HGB 8.9*  HCT 29.2*  MCV 112.3*  PLT 223    Scheduled Meds: . aspirin EC  81 mg Oral Daily  . atorvastatin  40 mg Oral Daily  . buPROPion  150 mg Oral BID  . Chlorhexidine Gluconate Cloth  6 each Topical Q0600  . cinacalcet  30 mg Oral Q M,W,F-1800  . clopidogrel  75 mg Oral Daily  . darbepoetin (ARANESP) injection - DIALYSIS  60 mcg Intravenous Q Wed-HD  . ferric citrate  210-420 mg Oral See admin instructions  . heparin  5,000 Units Subcutaneous Q8H  . ipratropium-albuterol  3 mL Nebulization Q6H  . ipratropium-albuterol      . isosorbide mononitrate  30 mg Oral QHS  . [START ON 06/29/2019] metoprolol succinate  50 mg Oral Daily  . mometasone-formoterol  2 puff Inhalation BID  . multivitamin  1 tablet Oral QHS  . [START ON 06/29/2019] predniSONE  40 mg Oral Q breakfast  . QUEtiapine  200 mg Oral QHS  . sodium chloride flush  3 mL Intravenous Q12H  . tiotropium  18 mcg Inhalation Daily   Continuous Infusions: PRN Meds:.acetaminophen **OR** acetaminophen, albuterol, loperamide, ondansetron **OR** ondansetron (ZOFRAN) IV, senna-docusate   Donetta Potts,  MD 06/28/2019, 1:12 PM

## 2019-06-28 NOTE — Plan of Care (Signed)
  Problem: Activity: Goal: Risk for activity intolerance will decrease Outcome: Progressing   

## 2019-06-28 NOTE — ED Provider Notes (Addendum)
[facility] EMERGENCY DEPARTMENT Provider Note   CSN: [id number] Arrival date & time: 06/28/19  0426    History   Chief Complaint Chief Complaint  Patient presents with  . Shortness of Breath    HPI Kari Robinson is a 68 y.o. female.     HPI 68 year old female comes in a chief complaint of shortness of breath. Patient has history of coronary artery disease, COPD, CHF.  She also has ESRD on dialysis.  Patient was admitted earlier for CAP and COPD exacerbation  and discharged on 8/3.  Patient reports that since she was discharged she has had diarrhea and weakness that has prevented her from going into dialysis.  Patient complains of 3-4 loose bowel movements a day that are nonbloody.  She has no significant abdominal discomfort associated with it or any nausea/vomiting.  Patient thinks that her shortness of breath has not improved, and perhaps gradually gotten worse.  Tonight she could not sleep at all therefore she called EMS.  She has been taking multiple nebulizer treatment at home and there is transient relief with it.  She has worsening cough with new sputum production that is clear and yellow in color.  Patient denies any sick contacts.  She has exertional shortness of breath.  Past Medical History:  Diagnosis Date  . Alcohol abuse   . Arthralgia   . CAD (coronary artery disease)   . Chronic combined systolic and diastolic HF (heart failure) ([place])   . COPD (chronic obstructive pulmonary disease) ([place])   . Depression   . ESRD (end stage renal disease) ([place])   . HTN (hypertension)   . Hypercholesterolemia    primarily ldl-p and small particles  . Lung cancer [facility])    Patient reports this with surgery.  No details.    [name] RAS (renal artery stenosis) ([place]) 2002   by cath tysinger  . Smoking     Patient Active Problem List   Diagnosis Date Noted  . Chronic respiratory failure with hypoxia ([place])   . Community acquired pneumonia of right lower lobe of  lung ([place])   . COPD with acute exacerbation ([place]) 06/16/2019  . COPD exacerbation ([place]) 05/09/2019  . Leukocytosis 05/09/2019  . Lumbar stenosis 05/09/2019  . Fall 04/01/2019  . Leg weakness, bilateral 04/01/2019  . Tobacco abuse 04/01/2019  . Alcohol abuse 04/01/2019  . Transient loss of consciousness 02/24/2019  . ESRD (end stage renal disease) on dialysis ([place]) 02/24/2019  . Seizure-like activity ([place]) 02/24/2019  . NSTEMI (non-ST elevated myocardial infarction) ([place]) 02/24/2019  . Ischemic cardiomyopathy 02/24/2019  . Hypertensive emergency 09/30/2018  . Type II diabetes mellitus with renal manifestations ([place]) 09/30/2018  . HLD (hyperlipidemia) 09/30/2018  . Depression with anxiety 09/30/2018  . CKD (chronic kidney disease), stage V ([place]) 09/30/2018  . Acute on chronic respiratory failure with hypoxia ([place]) 09/30/2018  . Fluid overload 09/30/2018  . CAD (coronary artery disease) 09/30/2018  . Anemia of chronic disease 09/30/2018  . COPD (chronic obstructive pulmonary disease) ([place])   . Acute on chronic combined systolic and diastolic CHF (congestive heart failure) ([place])   . Solitary right kidney 06/24/2018  . Chronic systolic CHF (congestive heart failure) ([place]) 06/24/2018  . Acute CHF (congestive heart failure) ([place]) 06/07/2018  . Hypertensive emergency 06/07/2018  . CKD (chronic kidney disease) stage 5, GFR less than 15 ml/min ([name]) 06/07/2018  . Normocytic normochromic anemia 06/07/2018  . DM (diabetes mellitus), type 2 with renal complications ([place]) 40/34/7425  . Hypertensive  urgency 06/07/2018  . Occlusion and stenosis of carotid artery without mention of cerebral infarction 01/19/2012  . RENAL ARTERY STENOSIS 09/10/2010  . ABDOMINAL BRUIT 08/12/2010  . DEPRESSION 08/11/2010  . PERIPHERAL VASCULAR DISEASE 08/11/2010  . Essential hypertension 08/08/2010  . Coronary atherosclerosis 08/08/2010  . ARTHRALGIA 08/08/2010    Past Surgical History:  Procedure Laterality  Date  . abdominal aortogram     perclose of the right femoral artery  . AV FISTULA PLACEMENT Right 07/11/2018   Procedure: Right arm ARTERIOVENOUS FISTULA CREATION;  Surgeon: [name], MD;  Location: [facility];  Service: Vascular;  Laterality: Right;  . AVF placement Right   . CARDIAC CATHETERIZATION     left heart catheterization.  Coronary cineangiography. Lft ventricular cineangiography.    [name] LUNG SURGERY     due to lung cancer per patient's report  . TUBAL LIGATION       OB History   No obstetric history on file.      Home Medications    Prior to Admission medications   Medication Sig Start Date End Date Taking? Authorizing Provider  albuterol (PROVENTIL HFA;VENTOLIN HFA) 108 (90 Base) MCG/ACT inhaler Inhale 2 puffs into the lungs every 4 (four) hours as needed for wheezing.  11/25/17  Yes [provider]  aspirin EC 81 MG tablet Take 81 mg by mouth daily.   Yes [provider]  atorvastatin (LIPITOR) 40 MG tablet Take 40 mg by mouth daily.   Yes [provider]  AURYXIA 1 GM 210 MG(Fe) tablet Take 210-420 mg by mouth See admin instructions. Take 2 tablets (420mg ) by mouth three times daily with meals, and 1 tablet (210mg ) by mouth with snacks 04/06/19  Yes [provider]  b complex-vitamin c-folic acid (NEPHRO-VITE) 0.8 MG TABS tablet Take 1 tablet by mouth See admin instructions. Take one tablet by mouth on Sunday, Tuesday, Thursday, Saturday mornings. Take one tablet after dialysis on Monday, Wednesday, Friday 02/06/19  Yes [provider]  budesonide-formoterol (SYMBICORT) 160-4.5 MCG/ACT inhaler Inhale 2 puffs into the lungs 2 (two) times daily.   Yes [provider]  buPROPion (WELLBUTRIN SR) 150 MG 12 hr tablet Take 150 mg by mouth 2 (two) times daily.   Yes [provider]  cinacalcet (SENSIPAR) 30 MG tablet Take 1 tablet (30 mg total) by mouth every Monday, Wednesday, and Friday at 6 PM. 05/12/19  Yes Ogbata,  Babs Bertin, MD  clopidogrel (PLAVIX) 75 MG tablet Take 75 mg by mouth daily.   Yes [provider]  ipratropium-albuterol (DUONEB) 0.5-2.5 (3) MG/3ML SOLN Take 3 mLs by nebulization every 6 (six) hours as needed. Patient taking differently: Take 3 mLs by nebulization every 6 (six) hours as needed (for wheezing).  05/11/19  Yes Dana Allan I, MD  lidocaine-prilocaine (EMLA) cream Apply 1 application topically every Monday, Wednesday, and Friday. Prior to dialysis 04/06/19  Yes [provider]  metoprolol succinate (TOPROL-XL) 50 MG 24 hr tablet Take 1 tablet (50 mg total) by mouth daily. Take with or immediately following a meal. 05/12/19  Yes Ogbata, Babs Bertin, MD  SEROQUEL 200 MG tablet Take 200 mg by mouth at bedtime. 04/05/19  Yes [provider]  tiotropium (SPIRIVA HANDIHALER) 18 MCG inhalation capsule Place 1 capsule (18 mcg total) into inhaler and inhale daily. 05/11/19 05/10/20 Yes Dana Allan I, MD  cefdinir (OMNICEF) 300 MG capsule Take 1 capsule Wednesday (06/21/19) after dialysis Patient not taking: Reported on 06/28/2019 06/20/19   Ladona Horns,  MD  isosorbide mononitrate (IMDUR) 30 MG 24 hr tablet Take 30 mg by mouth at bedtime.     [provider]  OXYGEN Inhale 2 L into the lungs continuous.    [provider]  predniSONE (DELTASONE) 20 MG tablet Take 2 tablets Wednesday (06/21/19) Patient not taking: Reported on 06/28/2019 06/20/19   Ladona Horns, MD    Family History Family History  Problem Relation Age of Onset  . Emphysema Father     Social History Social History   Tobacco Use  . Smoking status: Former Smoker    Years: 35.00    Types: Cigarettes    Quit date: 05/16/2019    Years since quitting: 0.1  . Smokeless tobacco: Never Used  . Tobacco comment: 3-4 cigarettes per day  Substance Use Topics  . Alcohol use: Never    Frequency: Never    Comment: states has quit drinking "a few months ago"  . Drug use: Never      Allergies   Citalopram hydrobromide and Morphine and related   Review of Systems Review of Systems  Constitutional: Positive for activity change.  Respiratory: Positive for cough, shortness of breath and wheezing.   All other systems reviewed and are negative.    Physical Exam Updated Vital Signs BP 135/87   Pulse 84   Resp (!) 22   SpO2 100%   Physical Exam Vitals signs and nursing note reviewed.  Constitutional:      Appearance: She is well-developed.  HENT:     Head: Normocephalic and atraumatic.  Eyes:     Pupils: Pupils are equal, round, and reactive to light.  Neck:     Musculoskeletal: Neck supple.  Cardiovascular:     Rate and Rhythm: Normal rate and regular rhythm.     Heart sounds: Murmur present.  Pulmonary:     Effort: Pulmonary effort is normal. No respiratory distress.     Breath sounds: Examination of the right-middle field reveals decreased breath sounds. Examination of the left-middle field reveals decreased breath sounds. Examination of the right-lower field reveals decreased breath sounds. Examination of the left-lower field reveals decreased breath sounds. Decreased breath sounds and wheezing present. No rhonchi.  Abdominal:     General: There is no distension.     Palpations: Abdomen is soft.     Tenderness: There is no abdominal tenderness. There is no guarding or rebound.  Musculoskeletal:     Right lower leg: No edema.     Left lower leg: No edema.  Skin:    General: Skin is warm and dry.  Neurological:     Mental Status: She is alert and oriented to person, place, and time.      ED Treatments / Results  Labs (all labs ordered are listed, but only abnormal results are displayed) Labs Reviewed  BASIC METABOLIC PANEL - Abnormal; Notable for the following components:      Result Value   Potassium 5.2 (*)    CO2 14 (*)    Glucose, Bld 103 (*)    BUN 112 (*)    Creatinine, Ser 11.26 (*)    GFR calc non Af Amer 3 (*)    GFR calc Af Amer  4 (*)    Anion gap 16 (*)    All other components within normal limits  CBC WITH DIFFERENTIAL/PLATELET - Abnormal; Notable for the following components:   RBC 2.60 (*)    Hemoglobin 8.9 (*)    HCT 29.2 (*)  MCV 112.3 (*)    MCH 34.2 (*)    RDW 18.4 (*)    Abs Immature Granulocytes 0.08 (*)    All other components within normal limits  MAGNESIUM - Abnormal; Notable for the following components:   Magnesium 2.6 (*)    All other components within normal limits  PHOSPHORUS - Abnormal; Notable for the following components:   Phosphorus 7.5 (*)    All other components within normal limits  SARS CORONAVIRUS 2 (HOSPITAL ORDER, [facility])    EKG EKG Interpretation  Date/Time:  Wednesday June 28 2019 04:35:17 EDT Ventricular Rate:  68 PR Interval:    QRS Duration: 106 QT Interval:  438 QTC Calculation: 466 R Axis:   80 Text Interpretation:  Sinus rhythm Ventricular trigeminy Repol abnrm suggests ischemia, diffuse leads trigeminy is new No significant change since last tracing Confirmed by Varney Biles ([id number]) on 06/28/2019 5:03:36 AM   Radiology Dg Chest Port 1 View  Result Date: 06/28/2019 CLINICAL DATA:  Shortness of breath EXAM: PORTABLE CHEST 1 VIEW COMPARISON:  06/16/2019 FINDINGS: Cardiomegaly. Postoperative left lung with volume loss and hilar clips. Coronary stenting. Interstitial coarsening that is stable from prior. Hyperinflation with diaphragm flattening. There is no edema, consolidation, effusion, or pneumothorax. IMPRESSION: 1. No acute finding when compared to prior. 2. Cardiomegaly and COPD. Electronically Signed   By: Monte Fantasia M.D.   On: 06/28/2019 05:09    Procedures .Critical Care Performed by: Varney Biles, MD Authorized by: Varney Biles, MD   Critical care provider statement:    Critical care time (minutes):  36   Critical care was necessary to treat or prevent imminent or life-threatening deterioration of the  following conditions:  Respiratory failure and renal failure   Critical care was time spent personally by me on the following activities:  Discussions with consultants, evaluation of patient's response to treatment, examination of patient, ordering and performing treatments and interventions, ordering and review of laboratory studies, ordering and review of radiographic studies, pulse oximetry, re-evaluation of patient's condition, obtaining history from patient or surrogate and review of old charts   (including critical care time)  Medications Ordered in ED Medications  albuterol (VENTOLIN HFA) 108 (90 Base) MCG/ACT inhaler 6 puff (6 puffs Inhalation Given 06/28/19 0602)  albuterol (VENTOLIN HFA) 108 (90 Base) MCG/ACT inhaler 4 puff (4 puffs Inhalation Given 06/28/19 0452)  methylPREDNISolone sodium succinate (SOLU-MEDROL) 125 mg/2 mL injection 125 mg (125 mg Intravenous Given 06/28/19 0530)  doxycycline (VIBRA-TABS) tablet 100 mg (100 mg Oral Given 06/28/19 0559)     Initial Impression / Assessment and Plan / ED Course  I have reviewed the triage vital signs and the nursing notes.  Pertinent labs & imaging results that were available during my care of the patient were reviewed by me and considered in my medical decision making (see chart for details).  Clinical Course as of Jun 28 619  Wed Jun 28, 2019  0530 Chest x-ray is not showing any signs of focal pneumonia, pulmonary edema, large pleural effusion.  We will treat her symptoms like COPD exacerbation.  DG Chest Port 1 View [AN]  0530 Potassium is mildly elevated without any EKG changes.  BUN is at 112, but patient does not have any altered sensorium.  She does not meet criteria for emergent dialysis.  Potassium(!): 5.2 [AN]    Clinical Course User Index [AN] Varney Biles, MD      68 year old female comes to the ER with chief complaint  of shortness of breath. She has history of CAD, COPD, ESRD and CHF. Patient has been having  progressively worsening shortness of breath along with a new cough and wheezing.  She has been using breathing treatments around-the-clock and is getting transient relief.  She is also missed dialysis since the time she was discharged because of weakness and diarrhea.  On evaluation she is tachypneic and has generalized wheezing without any rales.  No overt signs of volume overload.  The wheezing could be because of her chronic lung disease exacerbation versus cardiac wheeze due to volume overload from her missed dialysis.  She denies fevers, unlikely to be pneumonia.  Patient was on cephalosporin while in the ER.  Her diarrhea is not significant by any means and clinically does not appear that she has C. difficile colitis.  6:20 AM Patient was ambulated.  Her O2 sats stayed about 94%, however she was in respiratory distress with minimal exertion per EMT.  When patient finally sat down her O2 sats at its lowest was 89%.  I ordered more nebulizer treatment as she was still wheezing.  I just reassessed the patient, she continues to have wheezing and does not feel comfortable going home.  Given her advanced COPD and other comorbidities, I think it might be worthwhile keeping patient in for COPD exacerbation.  Medicine admission requested.  Final Clinical Impressions(s) / ED Diagnoses   Final diagnoses:  COPD with acute bronchitis ([place])  Hypoxia  Uremia, acute    ED Discharge Orders    None       Varney Biles, MD 06/28/19 [place], Arthur, MD 06/28/19 [phone]

## 2019-06-28 NOTE — ED Notes (Signed)
Pt's pulse ox > 94% while ambulating with walker. Pt was very SOB and unsteady. Pt stated she has panic attacks when she gets SOB

## 2019-06-29 ENCOUNTER — Institutional Professional Consult (permissible substitution): Payer: Medicare Other | Admitting: Pulmonary Disease

## 2019-06-29 DIAGNOSIS — I5020 Unspecified systolic (congestive) heart failure: Secondary | ICD-10-CM

## 2019-06-29 DIAGNOSIS — Z85118 Personal history of other malignant neoplasm of bronchus and lung: Secondary | ICD-10-CM

## 2019-06-29 DIAGNOSIS — E785 Hyperlipidemia, unspecified: Secondary | ICD-10-CM

## 2019-06-29 DIAGNOSIS — I252 Old myocardial infarction: Secondary | ICD-10-CM

## 2019-06-29 DIAGNOSIS — I4891 Unspecified atrial fibrillation: Secondary | ICD-10-CM

## 2019-06-29 DIAGNOSIS — Z955 Presence of coronary angioplasty implant and graft: Secondary | ICD-10-CM

## 2019-06-29 DIAGNOSIS — G4700 Insomnia, unspecified: Secondary | ICD-10-CM

## 2019-06-29 DIAGNOSIS — Z902 Acquired absence of lung [part of]: Secondary | ICD-10-CM

## 2019-06-29 LAB — RENAL FUNCTION PANEL
Albumin: 3.2 g/dL — ABNORMAL LOW (ref 3.5–5.0)
Anion gap: 14 (ref 5–15)
BUN: 59 mg/dL — ABNORMAL HIGH (ref 8–23)
CO2: 24 mmol/L (ref 22–32)
Calcium: 9.1 mg/dL (ref 8.9–10.3)
Chloride: 103 mmol/L (ref 98–111)
Creatinine, Ser: 7.25 mg/dL — ABNORMAL HIGH (ref 0.44–1.00)
GFR calc Af Amer: 6 mL/min — ABNORMAL LOW (ref >60)
GFR calc non Af Amer: 5 mL/min — ABNORMAL LOW (ref >60)
Glucose, Bld: 111 mg/dL — ABNORMAL HIGH (ref 70–99)
Phosphorus: 6.1 mg/dL — ABNORMAL HIGH (ref 2.5–4.6)
Potassium: 4.5 mmol/L (ref 3.5–5.1)
Sodium: 141 mmol/L (ref 135–145)

## 2019-06-29 MED ORDER — IPRATROPIUM-ALBUTEROL 0.5-2.5 (3) MG/3ML IN SOLN: 3.0000 mL | Freq: Two times a day (BID) | RESPIRATORY_TRACT | Status: DC

## 2019-06-29 MED ORDER — PREDNISONE 20 MG PO TABS: 40.0000 mg | ORAL_TABLET | Freq: Every day | ORAL | 0 refills | Status: DC

## 2019-06-29 MED ORDER — CHLORHEXIDINE GLUCONATE CLOTH 2 % EX PADS: 6.0000 | MEDICATED_PAD | Freq: Every day | CUTANEOUS | Status: DC

## 2019-06-29 MED FILL — predniSONE 20 MG TABS: 20 | 3 days supply | Qty: 6 | Fill #0

## 2019-06-29 NOTE — Progress Notes (Signed)
[facility] Progress Note   Subjective:   Seen in room. Initially coughing and spit out a pill, says "those big pills hard to swallow." She reports some SOB this AM but thinks her anxiety was contributing. Breathing is better now. Denies CP, palpitations, abdominal pain, N/V/D, dizziness, weakness and edema.   Objective Vitals:   06/28/19 1950 06/29/19 0445 06/29/19 0447 06/29/19 0807  BP: (!) 161/102 119/64    Pulse: (!) 109 (!) 53 87 86  Resp: 18 16 16 16   Temp: 98 F (36.7 C) (!) 97.5 F (36.4 C)    TempSrc: Oral Oral    SpO2: 98% 97% 98% 97%  Weight:   70.2 kg    Physical Exam General: Chronically ill appearing female, alert and in NAD Heart: RRR, no murmurs, rubs or gallops Lungs: CTA bilaterally without wheezing, rhonchi or rales. On O2 via  Abdomen: Soft, non-tender, non-distended. +BS Extremities: No peripheral edema.  Dialysis Access: RUE AVF + bruit  Additional Objective Labs: Basic Metabolic Panel: Recent Labs  Lab 06/28/19 0437 06/29/19 0525  NA 141 141  K 5.2* 4.5  CL 111 103  CO2 14* 24  GLUCOSE 103* 111*  BUN 112* 59*  CREATININE 11.26* 7.25*  CALCIUM 9.6 9.1  PHOS 7.5* 6.1*   Liver Function Tests: Recent Labs  Lab 06/29/19 0525  ALBUMIN 3.2*   CBC: Recent Labs  Lab 06/28/19 0437  WBC 7.2  NEUTROABS 5.0  HGB 8.9*  HCT 29.2*  MCV 112.3*  PLT 223    Studies/Results: Dg Chest Port 1 View  Result Date: 06/28/2019 CLINICAL DATA:  Shortness of breath EXAM: PORTABLE CHEST 1 VIEW COMPARISON:  06/16/2019 FINDINGS: Cardiomegaly. Postoperative left lung with volume loss and hilar clips. Coronary stenting. Interstitial coarsening that is stable from prior. Hyperinflation with diaphragm flattening. There is no edema, consolidation, effusion, or pneumothorax. IMPRESSION: 1. No acute finding when compared to prior. 2. Cardiomegaly and COPD. Electronically Signed   By: Monte Fantasia M.D.   On: 06/28/2019 05:09   Medications:  .  aspirin EC  81 mg Oral Daily  . atorvastatin  40 mg Oral Daily  . buPROPion  150 mg Oral BID  . Chlorhexidine Gluconate Cloth  6 each Topical Q0600  . cinacalcet  30 mg Oral Q M,W,F-1800  . clopidogrel  75 mg Oral Daily  . darbepoetin (ARANESP) injection - DIALYSIS  60 mcg Intravenous Q Wed-HD  . ferric citrate  210 mg Oral With snacks  . ferric citrate  420 mg Oral TID WC  . heparin  5,000 Units Subcutaneous Q8H  . ipratropium-albuterol  3 mL Nebulization Q6H  . isosorbide mononitrate  30 mg Oral QHS  . metoprolol succinate  50 mg Oral Daily  . mometasone-formoterol  2 puff Inhalation BID  . multivitamin  1 tablet Oral QHS  . predniSONE  40 mg Oral Q breakfast  . QUEtiapine  200 mg Oral QHS  . sodium chloride flush  3 mL Intravenous Q12H  . umeclidinium bromide  1 puff Inhalation Daily    Dialysis Orders: Center: [facility] on MWF. Time: 3:30 hr; 180NRe; BFR 350/DFR Auto 1.5; EDW 68.5; 2K/2Ca; no heparin; RUE AVF Aranesp 60 mcg 1x week- last dose ? Auryxia 210 mg 2 tabs TID with meals  Assessment/Plan: 1.  COPD exacerbation: Reports compliance with medications and no improvement since her last admission. Diffuse wheezing on admission- now improved. CXR did not show any pneumonia or edema. On steroids, duoneb and inhalers.  2.  ESRD:  Patient had not had dialysis since 06/19/2019 during her last admission. On admission, BUN 112, K+ 5.2. Had  dialysis yesterday with decreased BFR/DFR as patient has not been to HD in over a week. BUN improved to 59, no evidence of volume overload on exam. Dialysis tomorrow- continue MWF schedule. Encouraged outpatient compliance.  3.  Hypertension/volume: Net UF 2L with HD yesterday. BP now controlled. Continue metoprolol.  4.  Anemia: Hgb 8.9. Given aranesp 60 mcg with HD yesterday. Continue q Wednesday  5.  Metabolic bone disease: Ca 9.1, Phos 6.1. Not compliant with outpatient binder. Continue auryxia  6.  Nutrition:  Recommend renal  diet with flud 7. Diarrhea: Patient reports to me that he diarrhea is now resolved.    Anice Paganini, PA-C 06/29/2019, 9:02 AM  [facility] Pager: [phone]

## 2019-06-29 NOTE — TOC Transition Note (Signed)
Transition of Care [facility]) - CM/SW Discharge Note   Patient Details  Name: Kari Robinson MRN: [id number] Date of Birth: 10/17/1951  [facility] CM/SW Contact:  Bartholomew Crews, RN Phone Number: [phone] 06/29/2019, 2:48 PM   Clinical Narrative:    Spoke with patient at bedside. States that she gets her oxygen through [facility]. Notified [facility] of need for oxygen tank for transition home today. Advised that patient is in process of getting trilogy NIV - requested MD signature and progress note to include need. MD notified. Received faxed order from [place] Will fax jback to [place] once signed. Will provide taxi voucher for transport home. No further CM needs identified.    Final next level of care: [place] Barriers to Discharge: No Barriers Identified   Patient Goals and CMS Choice Patient states their goals for this hospitalization and ongoing recovery are:: return home CMS Medicare.gov Compare Post Acute Care list provided to:: Patient Choice offered to / list presented to : Patient  Discharge Placement                       Discharge Plan and Services                DME Arranged: Oxygen, NIV DME Agency: [facility] Date DME Agency Contacted: 06/29/19 Time DME Agency Contacted: [id number] Representative spoke with at DME Agency: [facility] Arranged: PT, Respirator Therapy [place] Agency: [place] Date [place]: 06/29/19 Time [place]: 1447 Representative spoke with at [place]: [place] ([place]) Interventions     Readmission Risk Interventions No flowsheet data found.

## 2019-06-29 NOTE — Progress Notes (Signed)
Subjective: Pt seen at the bedside this morning. Sitting up in bed, resting comfortably on room air. States she feels well. Denies SOB, cough, or chest pain.  Objective:  Vital signs in last 24 hours: Vitals:   06/28/19 1700 06/28/19 1950 06/29/19 0445 06/29/19 0447  BP: (!) 155/94 (!) 161/102 119/64   Pulse: (!) 104 (!) 109 (!) 53 87  Resp: 20 18 16 16   Temp: 98.9 F (37.2 C) 98 F (36.7 C) (!) 97.5 F (36.4 C)   TempSrc: Oral Oral Oral   SpO2: 99% 98% 97% 98%  Weight:    70.2 kg   Physical Exam Constitutional:      General: She is not in acute distress.    Appearance: She is ill-appearing (chronically).  Cardiovascular:     Rate and Rhythm: Normal rate and regular rhythm.     Heart sounds: Normal heart sounds.  Pulmonary:     Effort: Pulmonary effort is normal. No tachypnea or respiratory distress.     Breath sounds: Decreased air movement present. No wheezing, rhonchi or rales.  Abdominal:     General: Abdomen is flat. Bowel sounds are normal. There is no distension.  Musculoskeletal:     Right lower leg: No edema.     Left lower leg: No edema.  Neurological:     Mental Status: She is alert.  Psychiatric:        Mood and Affect: Mood is anxious.     Assessment/Plan:  Assessment - Pt is a 68 year old F with history of COPD with multiple prior hospitalizations for exacerbations (most recently 9 days ago), 200 pack year smoking history, L lower lung lobectomy due to cancer in 2017, ESRD on HD (MWF), HTN, HLD, STEMI w/ stent placement x2, Afib w/ RVR, HFrEF (EF 40% on 02/25/19) who presented for shortness of breath due to anxiety, mild COPD exacerbation, and missing a week of dialysis.   Mild COPD Exacerbation - Comfortable overnight on home O2 2-3L.  Pt's dyspnea likely secondary to anxiety and COPD. Pt having stressors at home that appear to be worsening her shortness of breath and limiting her ability to go to outpatient dialysis. - with only 1 of 3 cardinal COPD  symptoms, she does not meet criteria for antibiotics - will continue prednisone 40mg  tablets for 6 days  - duonebs (ipratropium-albuterol) q4hr - continue home inhalers - pt instructed to reschedule pulmonology appointment that she missed today   ESRD on HD (MWF) - Pt missed dialysis since discharge on 06/19/2019. K 5.2 and BUN 112 on admission - BUN this morning 59 with K 4.5, no volume overload on exam Nephrology following, appreciate recommendations.  - had dialysis yesterday with decreased BFR/DFR due to missing 1 week  - continue MWF with dialysis tomorrow outpatient - counseled pt on the importance of going to her outpatient dialysis center each week and to not skip sessions   Macrocytic anemia - Hgb 8.9 on admission Hgb is appears chronically low with baseline 9-10?[name] MCV elevated 112. - missed aranesp doses at dialysis - received 57mcg aranesp with HD yesterday   STEMI w/ stent placement x2 - pt reminded to keep her cardiology appointment next week - continue ASA 81mg  and plavix 75mg  daily  - continue metoprolol 50mg  HTN - isosorbide mononitrate 30mg  qhs  HLD - atorvastatin 40mg  daily Insomnia - continue home seroquel 200mg  qhs     Diet - Renal Diet 1286mL fluid restriction DVT ppx - heparin 5,000 units subq q8h  Fluids - none CODE STATUS - FULL CODE   Dispo: Anticipated discharge today with scheduled outpatient dialysis tomorrow.  Ladona Horns, MD 06/29/2019, 7:24 AM Pager: [phone]

## 2019-06-29 NOTE — Progress Notes (Signed)
DISCHARGE NOTE HOME Kari Robinson to be discharged Home per MD order. Discussed prescriptions and follow up appointments with the patient. Prescriptions given to patient; medication list explained in detail. Patient verbalized understanding.  Skin clean, dry and intact without evidence of skin break down, no evidence of skin tears noted. IV catheter discontinued intact. Site without signs and symptoms of complications. Dressing and pressure applied. Pt denies pain at the site currently. No complaints noted.  Patient free of lines, drains, and wounds.   An After Visit Summary (AVS) was printed and given to the patient. Patient escorted via wheelchair, and discharged home via private auto.  Arlyss Repress, RN
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
## 2019-06-29 NOTE — Progress Notes (Signed)
  Date: 06/29/2019  Patient name: Kari Robinson  Medical record number: [id number]  Date of birth: [birth year]   I have seen and evaluated this patient and I have discussed the plan of care with the house staff. Please see Dr. Ronnald Ramp' note for complete details. I concur with her findings and plan.  Patient is stable for discharge today with HD tomorrow.   Sid Falcon, MD 06/29/2019, 8:22 PM
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
## 2019-07-02 ENCOUNTER — Encounter (HOSPITAL_COMMUNITY): Payer: Self-pay | Admitting: Emergency Medicine

## 2019-07-02 ENCOUNTER — Emergency Department (HOSPITAL_COMMUNITY): Payer: Medicare Other

## 2019-07-02 ENCOUNTER — Inpatient Hospital Stay (HOSPITAL_COMMUNITY)
Admission: EM | Admit: 2019-07-02 | Discharge: 2019-07-12 | DRG: 640 | Disposition: A | Discharge status: Skilled Nursing Facility | Payer: Medicare Other | Attending: Internal Medicine | Admitting: Internal Medicine

## 2019-07-02 ENCOUNTER — Other Ambulatory Visit: Payer: Self-pay

## 2019-07-02 DIAGNOSIS — I5042 Chronic combined systolic (congestive) and diastolic (congestive) heart failure: Secondary | ICD-10-CM | POA: Diagnosis present

## 2019-07-02 DIAGNOSIS — M48061 Spinal stenosis, lumbar region without neurogenic claudication: Secondary | ICD-10-CM | POA: Diagnosis present

## 2019-07-02 DIAGNOSIS — I4891 Unspecified atrial fibrillation: Secondary | ICD-10-CM | POA: Diagnosis present

## 2019-07-02 DIAGNOSIS — Z825 Family history of asthma and other chronic lower respiratory diseases: Secondary | ICD-10-CM

## 2019-07-02 DIAGNOSIS — J9611 Chronic respiratory failure with hypoxia: Secondary | ICD-10-CM | POA: Diagnosis present

## 2019-07-02 DIAGNOSIS — Z95818 Presence of other cardiac implants and grafts: Secondary | ICD-10-CM

## 2019-07-02 DIAGNOSIS — F1021 Alcohol dependence, in remission: Secondary | ICD-10-CM | POA: Diagnosis present

## 2019-07-02 DIAGNOSIS — I132 Hypertensive heart and chronic kidney disease with heart failure and with stage 5 chronic kidney disease, or end stage renal disease: Secondary | ICD-10-CM | POA: Diagnosis present

## 2019-07-02 DIAGNOSIS — F419 Anxiety disorder, unspecified: Secondary | ICD-10-CM | POA: Diagnosis present

## 2019-07-02 DIAGNOSIS — Z9115 Patient's noncompliance with renal dialysis: Secondary | ICD-10-CM

## 2019-07-02 DIAGNOSIS — J449 Chronic obstructive pulmonary disease, unspecified: Secondary | ICD-10-CM | POA: Diagnosis present

## 2019-07-02 DIAGNOSIS — M5136 Other intervertebral disc degeneration, lumbar region: Secondary | ICD-10-CM | POA: Diagnosis present

## 2019-07-02 DIAGNOSIS — I10 Essential (primary) hypertension: Secondary | ICD-10-CM | POA: Diagnosis present

## 2019-07-02 DIAGNOSIS — Z9981 Dependence on supplemental oxygen: Secondary | ICD-10-CM | POA: Diagnosis not present

## 2019-07-02 DIAGNOSIS — Z09 Encounter for follow-up examination after completed treatment for conditions other than malignant neoplasm: Secondary | ICD-10-CM

## 2019-07-02 DIAGNOSIS — Z885 Allergy status to narcotic agent status: Secondary | ICD-10-CM

## 2019-07-02 DIAGNOSIS — I701 Atherosclerosis of renal artery: Secondary | ICD-10-CM | POA: Diagnosis present

## 2019-07-02 DIAGNOSIS — Z902 Acquired absence of lung [part of]: Secondary | ICD-10-CM | POA: Diagnosis not present

## 2019-07-02 DIAGNOSIS — Z79899 Other long term (current) drug therapy: Secondary | ICD-10-CM

## 2019-07-02 DIAGNOSIS — J811 Chronic pulmonary edema: Secondary | ICD-10-CM

## 2019-07-02 DIAGNOSIS — N186 End stage renal disease: Secondary | ICD-10-CM | POA: Diagnosis present

## 2019-07-02 DIAGNOSIS — D631 Anemia in chronic kidney disease: Secondary | ICD-10-CM | POA: Diagnosis present

## 2019-07-02 DIAGNOSIS — Z87891 Personal history of nicotine dependence: Secondary | ICD-10-CM

## 2019-07-02 DIAGNOSIS — M5137 Other intervertebral disc degeneration, lumbosacral region: Secondary | ICD-10-CM | POA: Diagnosis present

## 2019-07-02 DIAGNOSIS — N2581 Secondary hyperparathyroidism of renal origin: Secondary | ICD-10-CM | POA: Diagnosis present

## 2019-07-02 DIAGNOSIS — G4700 Insomnia, unspecified: Secondary | ICD-10-CM | POA: Diagnosis present

## 2019-07-02 DIAGNOSIS — Z20828 Contact with and (suspected) exposure to other viral communicable diseases: Secondary | ICD-10-CM | POA: Diagnosis present

## 2019-07-02 DIAGNOSIS — R0600 Dyspnea, unspecified: Secondary | ICD-10-CM | POA: Diagnosis present

## 2019-07-02 DIAGNOSIS — Z992 Dependence on renal dialysis: Secondary | ICD-10-CM | POA: Diagnosis not present

## 2019-07-02 DIAGNOSIS — E875 Hyperkalemia: Secondary | ICD-10-CM | POA: Diagnosis present

## 2019-07-02 DIAGNOSIS — D539 Nutritional anemia, unspecified: Secondary | ICD-10-CM | POA: Diagnosis present

## 2019-07-02 DIAGNOSIS — E7800 Pure hypercholesterolemia, unspecified: Secondary | ICD-10-CM | POA: Diagnosis present

## 2019-07-02 DIAGNOSIS — E785 Hyperlipidemia, unspecified: Secondary | ICD-10-CM | POA: Diagnosis not present

## 2019-07-02 DIAGNOSIS — G8929 Other chronic pain: Secondary | ICD-10-CM | POA: Diagnosis present

## 2019-07-02 DIAGNOSIS — I252 Old myocardial infarction: Secondary | ICD-10-CM

## 2019-07-02 DIAGNOSIS — F329 Major depressive disorder, single episode, unspecified: Secondary | ICD-10-CM | POA: Diagnosis present

## 2019-07-02 DIAGNOSIS — Z955 Presence of coronary angioplasty implant and graft: Secondary | ICD-10-CM | POA: Diagnosis not present

## 2019-07-02 DIAGNOSIS — Z85118 Personal history of other malignant neoplasm of bronchus and lung: Secondary | ICD-10-CM

## 2019-07-02 DIAGNOSIS — I739 Peripheral vascular disease, unspecified: Secondary | ICD-10-CM | POA: Diagnosis present

## 2019-07-02 DIAGNOSIS — I472 Ventricular tachycardia: Secondary | ICD-10-CM | POA: Diagnosis not present

## 2019-07-02 DIAGNOSIS — M5127 Other intervertebral disc displacement, lumbosacral region: Secondary | ICD-10-CM | POA: Diagnosis present

## 2019-07-02 DIAGNOSIS — I5020 Unspecified systolic (congestive) heart failure: Secondary | ICD-10-CM | POA: Diagnosis not present

## 2019-07-02 DIAGNOSIS — I953 Hypotension of hemodialysis: Secondary | ICD-10-CM | POA: Diagnosis not present

## 2019-07-02 DIAGNOSIS — E8779 Other fluid overload: Principal | ICD-10-CM | POA: Diagnosis present

## 2019-07-02 DIAGNOSIS — R0602 Shortness of breath: Secondary | ICD-10-CM | POA: Diagnosis present

## 2019-07-02 DIAGNOSIS — I2510 Atherosclerotic heart disease of native coronary artery without angina pectoris: Secondary | ICD-10-CM | POA: Diagnosis present

## 2019-07-02 DIAGNOSIS — I5022 Chronic systolic (congestive) heart failure: Secondary | ICD-10-CM | POA: Diagnosis present

## 2019-07-02 DIAGNOSIS — M549 Dorsalgia, unspecified: Secondary | ICD-10-CM | POA: Diagnosis not present

## 2019-07-02 DIAGNOSIS — Z7951 Long term (current) use of inhaled steroids: Secondary | ICD-10-CM

## 2019-07-02 DIAGNOSIS — Z7982 Long term (current) use of aspirin: Secondary | ICD-10-CM

## 2019-07-02 DIAGNOSIS — E1129 Type 2 diabetes mellitus with other diabetic kidney complication: Secondary | ICD-10-CM | POA: Diagnosis present

## 2019-07-02 DIAGNOSIS — Z888 Allergy status to other drugs, medicaments and biological substances status: Secondary | ICD-10-CM

## 2019-07-02 DIAGNOSIS — Z7902 Long term (current) use of antithrombotics/antiplatelets: Secondary | ICD-10-CM

## 2019-07-02 LAB — CBC WITH DIFFERENTIAL/PLATELET
Abs Immature Granulocytes: 0.16 10*3/uL — ABNORMAL HIGH (ref 0.00–0.07)
Basophils Absolute: 0 10*3/uL (ref 0.0–0.1)
Basophils Relative: 0 %
Eosinophils Absolute: 0.4 10*3/uL (ref 0.0–0.5)
Eosinophils Relative: 3 %
HCT: 31.9 % — ABNORMAL LOW (ref 36.0–46.0)
Hemoglobin: 10 g/dL — ABNORMAL LOW (ref 12.0–15.0)
Immature Granulocytes: 1 %
Lymphocytes Relative: 2 %
Lymphs Abs: 0.3 10*3/uL — ABNORMAL LOW (ref 0.7–4.0)
MCH: 34.2 pg — ABNORMAL HIGH (ref 26.0–34.0)
MCHC: 31.3 g/dL (ref 30.0–36.0)
MCV: 109.2 fL — ABNORMAL HIGH (ref 80.0–100.0)
Monocytes Absolute: 0.3 10*3/uL (ref 0.1–1.0)
Monocytes Relative: 2 %
Neutro Abs: 13.3 10*3/uL — ABNORMAL HIGH (ref 1.7–7.7)
Neutrophils Relative %: 92 %
Platelets: 254 10*3/uL (ref 150–400)
RBC: 2.92 MIL/uL — ABNORMAL LOW (ref 3.87–5.11)
RDW: 18.1 % — ABNORMAL HIGH (ref 11.5–15.5)
WBC: 14.6 10*3/uL — ABNORMAL HIGH (ref 4.0–10.5)
nRBC: 0.4 % — ABNORMAL HIGH (ref 0.0–0.2)

## 2019-07-02 LAB — BASIC METABOLIC PANEL
Anion gap: 17 — ABNORMAL HIGH (ref 5–15)
BUN: 96 mg/dL — ABNORMAL HIGH (ref 8–23)
CO2: 17 mmol/L — ABNORMAL LOW (ref 22–32)
Calcium: 9.4 mg/dL (ref 8.9–10.3)
Chloride: 105 mmol/L (ref 98–111)
Creatinine, Ser: 10.23 mg/dL — ABNORMAL HIGH (ref 0.44–1.00)
GFR calc Af Amer: 4 mL/min — ABNORMAL LOW (ref >60)
GFR calc non Af Amer: 3 mL/min — ABNORMAL LOW (ref >60)
Glucose, Bld: 121 mg/dL — ABNORMAL HIGH (ref 70–99)
Potassium: 5.4 mmol/L — ABNORMAL HIGH (ref 3.5–5.1)
Sodium: 139 mmol/L (ref 135–145)

## 2019-07-02 LAB — D-DIMER, QUANTITATIVE: D-Dimer, Quant: 2.4 ug/mL-FEU — ABNORMAL HIGH (ref 0.00–0.50)

## 2019-07-02 LAB — SARS CORONAVIRUS 2 BY RT PCR (HOSPITAL ORDER, PERFORMED IN ~~LOC~~ HOSPITAL LAB): SARS Coronavirus 2: NEGATIVE

## 2019-07-02 MED ORDER — CLOPIDOGREL BISULFATE 75 MG PO TABS
75.0000 mg | ORAL_TABLET | Freq: Every day | ORAL | Status: DC
  Administered 2019-07-03 – 2019-07-12 (×10): 75 mg via ORAL
  Filled 2019-07-02 (×10): qty 1

## 2019-07-02 MED ORDER — IPRATROPIUM-ALBUTEROL 0.5-2.5 (3) MG/3ML IN SOLN
3.0000 mL | Freq: Four times a day (QID) | RESPIRATORY_TRACT | Status: DC
  Administered 2019-07-02 – 2019-07-03 (×3): 3 mL via RESPIRATORY_TRACT
  Filled 2019-07-02 (×3): qty 3

## 2019-07-02 MED ORDER — HEPARIN SODIUM (PORCINE) 5000 UNIT/ML IJ SOLN
5000.0000 [IU] | Freq: Three times a day (TID) | INTRAMUSCULAR | Status: DC
  Administered 2019-07-03 – 2019-07-12 (×29): 5000 [IU] via SUBCUTANEOUS
  Filled 2019-07-02 (×29): qty 1

## 2019-07-02 MED ORDER — QUETIAPINE FUMARATE 25 MG PO TABS
200.0000 mg | ORAL_TABLET | Freq: Every day | ORAL | Status: DC
  Administered 2019-07-03 – 2019-07-11 (×10): 200 mg via ORAL
  Filled 2019-07-02 (×2): qty 8
  Filled 2019-07-02: qty 1
  Filled 2019-07-02 (×6): qty 8
  Filled 2019-07-02: qty 1
  Filled 2019-07-02: qty 8

## 2019-07-02 MED ORDER — ASPIRIN EC 81 MG PO TBEC
81.0000 mg | DELAYED_RELEASE_TABLET | Freq: Every day | ORAL | Status: DC
  Administered 2019-07-03 – 2019-07-12 (×10): 81 mg via ORAL
  Filled 2019-07-02 (×10): qty 1

## 2019-07-02 MED ORDER — METOPROLOL SUCCINATE ER 50 MG PO TB24
50.0000 mg | ORAL_TABLET | Freq: Every day | ORAL | Status: DC
  Administered 2019-07-03 – 2019-07-04 (×2): 50 mg via ORAL
  Filled 2019-07-02 (×2): qty 1

## 2019-07-02 MED ORDER — SODIUM CHLORIDE 0.9 % IV SOLN: 100.0000 mL | INTRAVENOUS | Status: DC | PRN

## 2019-07-02 MED ORDER — PENTAFLUOROPROP-TETRAFLUOROETH EX AERO
1.0000 "application " | INHALATION_SPRAY | CUTANEOUS | Status: DC | PRN
  Filled 2019-07-02: qty 30

## 2019-07-02 MED ORDER — HEPARIN SODIUM (PORCINE) 1000 UNIT/ML DIALYSIS
40.0000 [IU]/kg | INTRAMUSCULAR | Status: DC | PRN
  Filled 2019-07-02: qty 3

## 2019-07-02 MED ORDER — METHYLPREDNISOLONE SODIUM SUCC 125 MG IJ SOLR
125.0000 mg | Freq: Once | INTRAMUSCULAR | Status: AC
  Administered 2019-07-02: 125 mg via INTRAVENOUS
  Filled 2019-07-02: qty 2

## 2019-07-02 MED ORDER — ALTEPLASE 2 MG IJ SOLR: 2.0000 mg | Freq: Once | INTRAMUSCULAR | Status: DC | PRN

## 2019-07-02 MED ORDER — ISOSORBIDE MONONITRATE ER 30 MG PO TB24
30.0000 mg | ORAL_TABLET | Freq: Every day | ORAL | Status: DC
  Administered 2019-07-02 – 2019-07-03 (×2): 30 mg via ORAL
  Filled 2019-07-02 (×2): qty 1

## 2019-07-02 MED ORDER — HEPARIN SODIUM (PORCINE) 1000 UNIT/ML DIALYSIS
1000.0000 [IU] | INTRAMUSCULAR | Status: DC | PRN
  Filled 2019-07-02: qty 1

## 2019-07-02 MED ORDER — SODIUM CHLORIDE 0.9 % IV SOLN: INTRAVENOUS | Status: DC

## 2019-07-02 MED ORDER — CHLORHEXIDINE GLUCONATE CLOTH 2 % EX PADS
6.0000 | MEDICATED_PAD | Freq: Every day | CUTANEOUS | Status: DC
  Administered 2019-07-03 – 2019-07-05 (×3): 6 via TOPICAL

## 2019-07-02 MED ORDER — LIDOCAINE HCL (PF) 1 % IJ SOLN: 5.0000 mL | INTRAMUSCULAR | Status: DC | PRN

## 2019-07-02 MED ORDER — BUPROPION HCL ER (SR) 150 MG PO TB12
150.0000 mg | ORAL_TABLET | Freq: Two times a day (BID) | ORAL | Status: DC
  Administered 2019-07-03 – 2019-07-05 (×5): 150 mg via ORAL
  Filled 2019-07-02 (×5): qty 1

## 2019-07-02 MED ORDER — LIDOCAINE-PRILOCAINE 2.5-2.5 % EX CREA
1.0000 "application " | TOPICAL_CREAM | CUTANEOUS | Status: DC | PRN
  Filled 2019-07-02: qty 5

## 2019-07-02 MED ORDER — ALBUTEROL SULFATE (2.5 MG/3ML) 0.083% IN NEBU: 5.0000 mg | INHALATION_SOLUTION | Freq: Once | RESPIRATORY_TRACT | Status: DC

## 2019-07-02 NOTE — ED Notes (Signed)
Unable to pulse oximetry while ambulating. Pt states she is too weak to stand.

## 2019-07-02 NOTE — ED Provider Notes (Signed)
[facility]PART[facility] Provider Note   CSN: [id number] Arrival date & time: 07/02/19  1209     History   Chief Complaint Chief Complaint  Patient presents with  . Shortness of Breath    HPI Kari Robinson is a 68 y.o. female.     67 year old female with history of renal failure who has not had dialysis for over a week presents with increasing shortness of breath.  Also has history of COPD.  Notes increased cough without fever or chills.  No anginal type chest pain.  Notes dyspnea on exertion as well as orthopnea.  No lower extremity edema.  Is unsure why she has not been going to dialysis.  No treatment use prior to arrival.     Past Medical History:  Diagnosis Date  . Alcohol abuse   . Arthralgia   . CAD (coronary artery disease)   . Chronic combined systolic and diastolic HF (heart failure) ([place])   . COPD (chronic obstructive pulmonary disease) ([place])   . Depression   . Dyspnea   . ESRD (end stage renal disease) ([place])   . HTN (hypertension)   . Hypercholesterolemia    primarily ldl-p and small particles  . Lung cancer [facility])    Patient reports this with surgery.  No details.    [name] RAS (renal artery stenosis) ([place]) 2002   by cath tysinger  . Smoking     Patient Active Problem List   Diagnosis Date Noted  . Respiratory failure ([place]) 06/28/2019  . Chronic respiratory failure with hypoxia ([place])   . Community acquired pneumonia of right lower lobe of lung ([place])   . COPD with acute exacerbation ([place]) 06/16/2019  . COPD exacerbation ([place]) 05/09/2019  . Leukocytosis 05/09/2019  . Lumbar stenosis 05/09/2019  . Fall 04/01/2019  . Leg weakness, bilateral 04/01/2019  . Tobacco abuse 04/01/2019  . Alcohol abuse 04/01/2019  . Transient loss of consciousness 02/24/2019  . ESRD (end stage renal disease) on dialysis ([place]) 02/24/2019  . Seizure-like activity ([place]) 02/24/2019  . NSTEMI (non-ST elevated myocardial infarction) ([place]) 02/24/2019  .  Ischemic cardiomyopathy 02/24/2019  . Hypertensive emergency 09/30/2018  . Type II diabetes mellitus with renal manifestations ([place]) 09/30/2018  . HLD (hyperlipidemia) 09/30/2018  . Depression with anxiety 09/30/2018  . CKD (chronic kidney disease), stage V ([place]) 09/30/2018  . Acute on chronic respiratory failure with hypoxia ([place]) 09/30/2018  . Fluid overload 09/30/2018  . CAD (coronary artery disease) 09/30/2018  . Anemia of chronic disease 09/30/2018  . COPD (chronic obstructive pulmonary disease) ([place])   . Acute on chronic combined systolic and diastolic CHF (congestive heart failure) ([place])   . Solitary right kidney 06/24/2018  . Chronic systolic CHF (congestive heart failure) ([place]) 06/24/2018  . Hypertensive emergency 06/07/2018  . CKD (chronic kidney disease) stage 5, GFR less than 15 ml/min ([name]) 06/07/2018  . Normocytic normochromic anemia 06/07/2018  . DM (diabetes mellitus), type 2 with renal complications ([place]) 92/33/0076  . Hypertensive urgency 06/07/2018  . Occlusion and stenosis of carotid artery without mention of cerebral infarction 01/19/2012  . RENAL ARTERY STENOSIS 09/10/2010  . ABDOMINAL BRUIT 08/12/2010  . DEPRESSION 08/11/2010  . PERIPHERAL VASCULAR DISEASE 08/11/2010  . Essential hypertension 08/08/2010  . Coronary atherosclerosis 08/08/2010  . ARTHRALGIA 08/08/2010    Past Surgical History:  Procedure Laterality Date  . abdominal aortogram     perclose of the right femoral artery  . AV FISTULA PLACEMENT Right 07/11/2018   Procedure: Right  arm ARTERIOVENOUS FISTULA CREATION;  Surgeon: [name], MD;  Location: [facility];  Service: Vascular;  Laterality: Right;  . AVF placement Right   . CARDIAC CATHETERIZATION     left heart catheterization.  Coronary cineangiography. Lft ventricular cineangiography.    [name] LUNG SURGERY     due to lung cancer per patient's report  . TUBAL LIGATION       OB History   No obstetric history on file.      Home  Medications    Prior to Admission medications   Medication Sig Start Date End Date Taking? Authorizing Provider  albuterol (PROVENTIL HFA;VENTOLIN HFA) 108 (90 Base) MCG/ACT inhaler Inhale 2 puffs into the lungs every 4 (four) hours as needed for wheezing.  11/25/17   [provider]  aspirin EC 81 MG tablet Take 81 mg by mouth daily.    [provider]  atorvastatin (LIPITOR) 40 MG tablet Take 40 mg by mouth daily.    [provider]  AURYXIA 1 GM 210 MG(Fe) tablet Take 210-420 mg by mouth See admin instructions. Take 2 tablets (420mg ) by mouth three times daily with meals, and 1 tablet (210mg ) by mouth with snacks 04/06/19   [provider]  b complex-vitamin c-folic acid (NEPHRO-VITE) 0.8 MG TABS tablet Take 1 tablet by mouth See admin instructions. Take one tablet by mouth on Sunday, Tuesday, Thursday, Saturday mornings. Take one tablet after dialysis on Monday, Wednesday, Friday 02/06/19   [provider]  budesonide-formoterol (SYMBICORT) 160-4.5 MCG/ACT inhaler Inhale 2 puffs into the lungs 2 (two) times daily.    [provider]  buPROPion (WELLBUTRIN SR) 150 MG 12 hr tablet Take 150 mg by mouth 2 (two) times daily.    [provider]  cinacalcet (SENSIPAR) 30 MG tablet Take 1 tablet (30 mg total) by mouth every Monday, Wednesday, and Friday at 6 PM. 05/12/19   Dana Allan I, MD  clopidogrel (PLAVIX) 75 MG tablet Take 75 mg by mouth daily.    [provider]  ipratropium-albuterol (DUONEB) 0.5-2.5 (3) MG/3ML SOLN Take 3 mLs by nebulization every 6 (six) hours as needed. Patient taking differently: Take 3 mLs by nebulization every 6 (six) hours as needed (for wheezing).  05/11/19   Bonnell Public, MD  isosorbide mononitrate (IMDUR) 30 MG 24 hr tablet Take 30 mg by mouth at bedtime.     [provider]  lidocaine-prilocaine (EMLA) cream Apply 1 application topically every Monday, Wednesday, and Friday. Prior  to dialysis 04/06/19   [provider]  metoprolol succinate (TOPROL-XL) 50 MG 24 hr tablet Take 1 tablet (50 mg total) by mouth daily. Take with or immediately following a meal. 05/12/19   Bonnell Public, MD  OXYGEN Inhale 2 L into the lungs continuous.    [provider]  predniSONE (DELTASONE) 20 MG tablet Take 2 tablets (40 mg total) by mouth daily with breakfast. 06/30/19   Katherine Roan, MD  SEROQUEL 200 MG tablet Take 200 mg by mouth at bedtime. 04/05/19   [provider]  tiotropium (SPIRIVA HANDIHALER) 18 MCG inhalation capsule Place 1 capsule (18 mcg total) into inhaler and inhale daily. 05/11/19 05/10/20  Bonnell Public, MD    Family History Family History  Problem Relation Age of Onset  . Emphysema Father     Social History Social History   Tobacco Use  . Smoking status: Former Smoker    Years: 35.00    Types: Cigarettes  Quit date: 05/16/2019    Years since quitting: 0.1  . Smokeless tobacco: Never Used  . Tobacco comment: 3-4 cigarettes per day  Substance Use Topics  . Alcohol use: Never    Frequency: Never    Comment: states has quit drinking "a few months ago"  . Drug use: Never     Allergies   Citalopram hydrobromide and Morphine and related   Review of Systems Review of Systems  All other systems reviewed and are negative.    Physical Exam Updated Vital Signs BP (!) 160/95 (BP Location: Left Arm)   Pulse (!) 112   Temp 98.1 F (36.7 C) (Oral)   Resp (!) 22   SpO2 97%   Physical Exam Vitals signs and nursing note reviewed.  Constitutional:      General: She is not in acute distress.    Appearance: Normal appearance. She is well-developed. She is not toxic-appearing.  HENT:     Head: Normocephalic and atraumatic.  Eyes:     General: Lids are normal.     Conjunctiva/sclera: Conjunctivae normal.     Pupils: Pupils are equal, round, and reactive to light.  Neck:     Musculoskeletal: Normal range of  motion and neck supple.     Thyroid: No thyroid mass.     Trachea: No tracheal deviation.  Cardiovascular:     Rate and Rhythm: Normal rate and regular rhythm.     Heart sounds: Normal heart sounds. No murmur. No gallop.   Pulmonary:     Effort: Pulmonary effort is normal. No respiratory distress.     Breath sounds: No stridor. Examination of the right-upper field reveals decreased breath sounds and rhonchi. Examination of the left-upper field reveals decreased breath sounds and rhonchi. Decreased breath sounds and rhonchi present. No wheezing or rales.  Abdominal:     General: Bowel sounds are normal. There is no distension.     Palpations: Abdomen is soft.     Tenderness: There is no abdominal tenderness. There is no rebound.  Musculoskeletal: Normal range of motion.        General: No tenderness.  Skin:    General: Skin is warm and dry.     Findings: No abrasion or rash.  Neurological:     Mental Status: She is alert and oriented to person, place, and time.     GCS: GCS eye subscore is 4. GCS verbal subscore is 5. GCS motor subscore is 6.     Cranial Nerves: No cranial nerve deficit.     Sensory: No sensory deficit.  Psychiatric:        Speech: Speech normal.        Behavior: Behavior normal.      ED Treatments / Results  Labs (all labs ordered are listed, but only abnormal results are displayed) Labs Reviewed  SARS CORONAVIRUS 2 ([facility])  CBC WITH DIFFERENTIAL/PLATELET  BASIC METABOLIC PANEL    EKG None  Radiology No results found.  Procedures Procedures (including critical care time)  Medications Ordered in ED Medications  0.9 %  sodium chloride infusion (has no administration in time range)     Initial Impression / Assessment and Plan / ED Course  I have reviewed the triage vital signs and the nursing notes.  Pertinent labs & imaging results that were available during my care of the patient were reviewed by  me and considered in my medical decision making (see chart for details).  Patient with ectopy here.  Wheezing is somewhat controlled.  Will give Solu-Medrol.  Chest x-ray without evidence of fluid overload.  Mild hyperkalemia potassium of 5.4.  D-dimer sent for possibility of PE.  Suspect that she has some element of COPD so is with her current symptoms.  Discussed with internal medicine teaching service will come admit the patient.  CRITICAL CARE Performed by: Leota Jacobsen Total critical care time: 45 minutes Critical care time was exclusive of separately billable procedures and treating other patients. Critical care was necessary to treat or prevent imminent or life-threatening deterioration. Critical care was time spent personally by me on the following activities: development of treatment plan with patient and/or surrogate as well as nursing, discussions with consultants, evaluation of patient's response to treatment, examination of patient, obtaining history from patient or surrogate, ordering and performing treatments and interventions, ordering and review of laboratory studies, ordering and review of radiographic studies, pulse oximetry and re-evaluation of patient's condition.   Final Clinical Impressions(s) / ED Diagnoses   Final diagnoses:  None    ED Discharge Orders    None       Lacretia Leigh, MD 07/02/19 1520

## 2019-07-02 NOTE — H&P (Addendum)
Date: 07/02/2019               Patient Name:  Kari Robinson MRN: [id number]  DOB: [birth year] Age / Sex: 68 y.o., female   PCP: Tsosie Billing, MD (Inactive)         Medical Service: Internal Medicine Teaching Service         Attending Physician: Dr. Rebeca Alert Raynaldo Opitz, MD    First Contact: Dr. Ladona Horns Pager: [phone]  Second Contact: Dr. Vickki Muff Pager: [phone]       After Hours (After 5p/  First Contact Pager: [phone]  weekends / holidays): Second Contact Pager: [phone]   Chief Complaint: shortness of breath  History of Present Illness: Pt is a 68 year old F with history of COPD with multiple prior hospitalizations for exacerbations (most recently 9 days ago), 200 pack year smoking history, L lower lung lobectomy due to cancer in 2017, ESRD on HD (MWF), HTN, HLD, STEMI w/ stent placement x2, Afib w/ RVR, HFrEF (EF 40% on 02/25/19) who presented to the ED for ongoing shortness of breath. She states that her dyspnea has not improved since she was discharged on 8/13. Pt says that she has been trying her inhalers and nebulizer at home which have not improved her dyspnea. She describes getting anxious when her breathing does not improve with at-home treatments, so she calls EMS.  Of note, Pt did not go to dialysis on Friday 8/14 because her ride told her that they couldn't transport her in her condition. When asked further what that meant, the pt stated she didn't remember details. Denies fevers, chills, or change in cough or sputum production. Endorses anxiety and tremors.   Meds: No current facility-administered medications on file prior to encounter.    Current Outpatient Medications on File Prior to Encounter  Medication Sig Dispense Refill  . albuterol (PROVENTIL HFA;VENTOLIN HFA) 108 (90 Base) MCG/ACT inhaler Inhale 2 puffs into the lungs every 4 (four) hours as needed for wheezing or shortness of breath.     [name] aspirin EC 81 MG tablet Take 81 mg by  mouth daily.    [name] atorvastatin (LIPITOR) 40 MG tablet Take 40 mg by mouth daily.    Lorin Picket 1 GM 210 MG(Fe) tablet Take 210-420 mg by mouth See admin instructions. Take 2 tablets (420mg ) by mouth three times daily with meals, and 1 tablet (210mg ) by mouth with snacks    . b complex-vitamin c-folic acid (NEPHRO-VITE) 0.8 MG TABS tablet Take 1 tablet by mouth See admin instructions. Take one tablet by mouth on Sunday, Tuesday, Thursday, Saturday mornings. Take one tablet after dialysis on Monday, Wednesday, Friday    . budesonide-formoterol (SYMBICORT) 160-4.5 MCG/ACT inhaler Inhale 2 puffs into the lungs 2 (two) times daily.    [name] buPROPion (WELLBUTRIN SR) 150 MG 12 hr tablet Take 150 mg by mouth 2 (two) times daily.    . cinacalcet (SENSIPAR) 30 MG tablet Take 1 tablet (30 mg total) by mouth every Monday, Wednesday, and Friday at 6 PM. 60 tablet 0  . clopidogrel (PLAVIX) 75 MG tablet Take 75 mg by mouth daily.    [name] ipratropium-albuterol (DUONEB) 0.5-2.5 (3) MG/3ML SOLN Take 3 mLs by nebulization every 6 (six) hours as needed. (Patient taking differently: Take 3 mLs by nebulization every 6 (six) hours as needed (for wheezing). ) 360 mL 0  . isosorbide mononitrate (IMDUR) 30 MG 24 hr tablet Take 30 mg by mouth at bedtime.     [name]  lidocaine-prilocaine (EMLA) cream Apply 1 application topically every Monday, Wednesday, and Friday. Prior to dialysis    . metoprolol succinate (TOPROL-XL) 50 MG 24 hr tablet Take 1 tablet (50 mg total) by mouth daily. Take with or immediately following a meal. 30 tablet 0  . OXYGEN Inhale 3 L/min into the lungs continuous.     . SEROQUEL 200 MG tablet Take 200 mg by mouth at bedtime.    [name] tiotropium (SPIRIVA HANDIHALER) 18 MCG inhalation capsule Place 1 capsule (18 mcg total) into inhaler and inhale daily. 30 capsule 2  . predniSONE (DELTASONE) 20 MG tablet Take 2 tablets (40 mg total) by mouth daily with breakfast. (Patient not taking: Reported on 07/02/2019) 6 tablet 0     Allergies: Allergies as of 07/02/2019 - Review Complete 07/02/2019  Allergen Reaction Noted  . Citalopram hydrobromide Hives   . Morphine and related Itching 09/30/2018   Past Medical History:  Diagnosis Date  . Alcohol abuse   . Arthralgia   . CAD (coronary artery disease)   . Chronic combined systolic and diastolic HF (heart failure) ([place])   . COPD (chronic obstructive pulmonary disease) ([place])   . Depression   . Dyspnea   . ESRD (end stage renal disease) ([place])   . HTN (hypertension)   . Hypercholesterolemia    primarily ldl-p and small particles  . Lung cancer [facility])    Patient reports this with surgery.  No details.    [name] RAS (renal artery stenosis) ([place]) 2002   by cath tysinger  . Smoking     Family History  Problem Relation Age of Onset  . Emphysema Father     Social History: 200 pack year smoking history (3-4 packs per day for 60 years). Denies EtOH use. Endorses marijuana use when she was younger. Lives in apartment with her boyfriend.  Review of Systems: Review of Systems  Constitutional: Negative for chills and fever.  HENT: Negative for congestion and sore throat.   Respiratory: Positive for cough, shortness of breath and wheezing. Negative for sputum production.   Cardiovascular: Positive for chest pain. Negative for leg swelling.  Gastrointestinal: Negative for abdominal pain, nausea and vomiting.  Genitourinary: Negative.   Musculoskeletal: Negative.   Skin: Negative.   Neurological: Negative for dizziness and headaches.  Psychiatric/Behavioral: The patient is nervous/anxious.     Physical Exam: Blood pressure (!) 160/95, pulse (!) 112, temperature 98.1 F (36.7 C), temperature source Oral, resp. rate (!) 22, SpO2 97 %. Physical Exam Vitals signs and nursing note reviewed.  Constitutional:      General: She is in acute distress.     Appearance: She is ill-appearing (chronically).  HENT:     Head: Normocephalic and atraumatic.  Cardiovascular:      Rate and Rhythm: Regular rhythm. Tachycardia present.     Heart sounds: Normal heart sounds. No murmur. No friction rub. No gallop.   Pulmonary:     Effort: Tachypnea and respiratory distress present. No prolonged expiration.     Comments: Crackles throughout with end expiratory wheezes. Musculoskeletal:     Right lower leg: No edema.     Left lower leg: No edema.  Skin:    General: Skin is warm and dry.  Neurological:     Mental Status: She is alert and oriented to person, place, and time.  Psychiatric:        Mood and Affect: Mood is anxious. Affect is tearful.    EKG: personally reviewed my interpretation is sinus tachycardia  with multiple paired PVCs  CXR: personally reviewed my interpretation is cardiomegaly, mild elevation of L hemidiaphragm seen on previous imaging, atelectasis vs scarring at the bases bilaterally  Assessment & Plan by Problem: Pt is a 68 year old F with history of COPD with multiple prior hospitalizations for exacerbations (most recently 2 days ago), 200 pack year smoking history, L lower lung lobectomy due to cancer in 2017, ESRD on HD (MWF), HTN, HLD, STEMI w/ stent placement x2, Afib w/ RVR, HFrEF (EF 40% on 02/25/19) who presented for ongoing shortness of breath. Volume overload due to missed dialysis sessions appears to be driving her dyspnea this admission, but underlying COPD and anxiety also contributory factors.   ESRD on HD (MWF) - Pt missed dialysis since discharge on 8/13. Stressors at home appear to be worsening her shortness of breath and limiting her ability to go to outpatient dialysis. Spoke extensively to social work previously regarding these barriers and educated about the importance of making dialysis appointments. K 5.4 and BUN 96 on admission Nephrology following, appreciate recommendations.  - sensitive to slight volume excess due to her tenuous cardiac/pulmonary status  - urgent hemodialysis - PT eval  Hyperkalemia - K of 5.4 today -  hemodialysis  HTN Elevated pressures secondary to anxiety and volume overload - hemodialysis - holding home antihypertensives tonight so as to not adversely effect her dialysis session this evening  Macrocytic anemia - Hgb 10.0 on admission Hgb is appears chronically low with baseline 9-10?[name] MCV elevated 109 - received 9mcg aranesp with HD on 8/12  COPD  -duonebs (ipratropium-albuterol) q6hr - solumedrol 125mg  IV once in ED  STEMI w/ stent placement x2 - ASA 81mg  and plavix 75mg  daily  - metoprolol 50mg  PO daily tomorrow Insomnia - continue home seroquel 200mg  qhs   Diet - Renal Diet 1231mL fluid restriction DVT ppx - heparin 5,000 units subq q8h Fluids - none CODE STATUS - FULL CODE   Dispo: Admit patient to Inpatient with expected length of stay greater than 2 midnights.  Signed: Ladona Horns, MD 07/02/2019, 3:51 PM  Pager: [phone]

## 2019-07-02 NOTE — ED Triage Notes (Signed)
Pt here for sob states has not been to dialysis since last time she had it here, pt aaox4 pt given  10 albuteral  , 5 atrovent, 125 solumedrol IV pt not wheezing as bad now,  5 [place] Progression Recent Vital Signs   There were no vitals taken for this visit.   Past Medical History:  Diagnosis Date  . Alcohol abuse   . Arthralgia   . CAD (coronary artery disease)   . Chronic combined systolic and diastolic HF (heart failure) ([place])   . COPD (chronic obstructive pulmonary disease) ([place])   . Depression   . Dyspnea   . ESRD (end stage renal disease) ([place])   . HTN (hypertension)   . Hypercholesterolemia    primarily ldl-p and small particles  . Lung cancer [facility])    Patient reports this with surgery.  No details.    [name] RAS (renal artery stenosis) ([place]) 2002   by cath tysinger  . Smoking      Expected Discharge Date     Diet Order    None       VTE Documentation      Work Intensity Score/Level of Care     @LEVELOFCARE @   Mobility        Significant Events    DC Barriers   Abnormal Labs:  Dwana Curd Celene Pippins 07/02/2019, 12:18 PM atrovent 125 solumedrol IV per ems

## 2019-07-02 NOTE — ED Notes (Signed)
ED TO INPATIENT HANDOFF REPORT  ED Nurse Name and Phone #:  Patty [phone]  S Name/Age/Gender Kari Robinson 68 y.o. female Room/Bed: 045C/045C  Code Status   Code Status: Full Code  Home/SNF/Other Home Patient oriented to: self, place, time and situation Is this baseline? Yes   Triage Complete: Triage complete  Chief Complaint sob/dialysis pt   Triage Note Pt here for sob states has not been to dialysis since last time she had it here, pt aaox4 pt given  10 albuteral  , 5 atrovent, 125 solumedrol IV pt not wheezing as bad now,  5 [place] Progression Recent Vital Signs   There were no vitals taken for this visit.   Past Medical History:  Diagnosis Date  . Alcohol abuse   . Arthralgia   . CAD (coronary artery disease)   . Chronic combined systolic and diastolic HF (heart failure) ([place])   . COPD (chronic obstructive pulmonary disease) ([place])   . Depression   . Dyspnea   . ESRD (end stage renal disease) ([place])   . HTN (hypertension)   . Hypercholesterolemia    primarily ldl-p and small particles  . Lung cancer [facility])    Patient reports this with surgery.  No details.    [name] RAS (renal artery stenosis) ([place]) 2002   by cath tysinger  . Smoking      Expected Discharge Date     Diet Order    None       VTE Documentation      Work Intensity Score/Level of Care     @LEVELOFCARE @   Mobility        Significant Events    DC Barriers   Abnormal Labs:  Kari Robinson 07/02/2019, 12:18 PM atrovent 125 solumedrol IV per ems   Allergies Allergies  Allergen Reactions  . Citalopram Hydrobromide Hives  . Morphine And Related Itching    Level of Care/Admitting Diagnosis ED Disposition    ED Disposition Condition [facility]: [place] [[id number]]  Level of Care: Progressive [102]  Covid Evaluation: Asymptomatic Screening Protocol (No Symptoms)  Diagnosis: Dyspnea [676720]  Admitting Physician: Oda Kilts [[id number]]  Attending Physician: Oda Kilts [[id number]]  Estimated length of stay: past midnight tomorrow  Certification:: I certify this patient will need inpatient services for at least 2 midnights  PT Class (Do Not Modify): Inpatient [101]  PT Acc Code (Do Not Modify): Private [1]       B Medical/Surgery History Past Medical History:  Diagnosis Date  . Alcohol abuse   . Arthralgia   . CAD (coronary artery disease)   . Chronic combined systolic and diastolic HF (heart failure) ([place])   . COPD (chronic obstructive pulmonary disease) ([place])   . Depression   . Dyspnea   . ESRD (end stage renal disease) ([place])   . HTN (hypertension)   . Hypercholesterolemia    primarily ldl-p and small particles  . Lung cancer [facility])    Patient reports this with surgery.  No details.    [name] RAS (renal artery stenosis) ([place]) 2002   by cath tysinger  . Smoking    Past Surgical History:  Procedure Laterality Date  . abdominal aortogram     perclose of the right femoral artery  . AV FISTULA PLACEMENT Right 07/11/2018   Procedure: Right arm ARTERIOVENOUS FISTULA CREATION;  Surgeon: [name], MD;  Location: [place];  Service: Vascular;  Laterality: Right;  .  AVF placement Right   . CARDIAC CATHETERIZATION     left heart catheterization.  Coronary cineangiography. Lft ventricular cineangiography.    [name] LUNG SURGERY     due to lung cancer per patient's report  . TUBAL LIGATION       A IV Location/Drains/Wounds Patient Lines/Drains/Airways Status   Active Line/Drains/Airways    Name:   Placement date:   Placement time:   Site:   Days:   Fistula / Graft Right Forearm Arteriovenous fistula   07/11/18    0839    Forearm   356   Fistula / Graft Right Forearm Arteriovenous fistula   02/24/19    -    Forearm   128   Incision (Closed) 07/11/18 Arm Right   07/11/18    0839     356          Intake/Output Last 24 hours No intake or output data in the 24 hours ending 07/02/19  1602  Labs/Imaging Results for orders placed or performed during the hospital encounter of 07/02/19 (from the past 48 hour(s))  CBC with Differential/Platelet     Status: Abnormal   Collection Time: 07/02/19  1:23 PM  Result Value Ref Range   WBC 14.6 (H) 4.0 - 10.5 K/uL   RBC 2.92 (L) 3.87 - 5.11 MIL/uL   Hemoglobin 10.0 (L) 12.0 - 15.0 g/dL   HCT 31.9 (L) 36.0 - 46.0 %   MCV 109.2 (H) 80.0 - 100.0 fL   MCH 34.2 (H) 26.0 - 34.0 pg   MCHC 31.3 30.0 - 36.0 g/dL   RDW 18.1 (H) 11.5 - 15.5 %   Platelets 254 150 - 400 K/uL   nRBC 0.4 (H) 0.0 - 0.2 %   Neutrophils Relative % 92 %   Neutro Abs 13.3 (H) 1.7 - 7.7 K/uL   Lymphocytes Relative 2 %   Lymphs Abs 0.3 (L) 0.7 - 4.0 K/uL   Monocytes Relative 2 %   Monocytes Absolute 0.3 0.1 - 1.0 K/uL   Eosinophils Relative 3 %   Eosinophils Absolute 0.4 0.0 - 0.5 K/uL   Basophils Relative 0 %   Basophils Absolute 0.0 0.0 - 0.1 K/uL   Immature Granulocytes 1 %   Abs Immature Granulocytes 0.16 (H) 0.00 - 0.07 K/uL    Comment: Performed at [facility], [address], [place], [place] [address]  Basic metabolic panel     Status: Abnormal   Collection Time: 07/02/19  1:23 PM  Result Value Ref Range   Sodium 139 135 - 145 mmol/L   Potassium 5.4 (H) 3.5 - 5.1 mmol/L   Chloride 105 98 - 111 mmol/L   CO2 17 (L) 22 - 32 mmol/L   Glucose, Bld 121 (H) 70 - 99 mg/dL   BUN 96 (H) 8 - 23 mg/dL   Creatinine, Ser 10.23 (H) 0.44 - 1.00 mg/dL   Calcium 9.4 8.9 - 10.3 mg/dL   GFR calc non Af Amer 3 (L) >60 mL/min   GFR calc Af Amer 4 (L) >60 mL/min   Anion gap 17 (H) 5 - 15    Comment: Performed at [facility] [address], [place], [place] [address]  D-dimer, quantitative (not at [facility])     Status: Abnormal   Collection Time: 07/02/19  1:23 PM  Result Value Ref Range   D-Dimer, Quant 2.40 (H) 0.00 - 0.50 ug/mL-FEU    Comment: (NOTE) At the manufacturer cut-off of 0.50 ug/mL FEU, this assay  has been documented to exclude PE with a  sensitivity and negative predictive value of 97 to 99%.  At this time, this assay has not been approved by the FDA to exclude DVT/VTE. Results should be correlated with clinical presentation. Performed at [facility], [place] [address] [place], [place] [address]    Dg Chest Port 1 View  Result Date: 07/02/2019 CLINICAL DATA:  Shortness of breath since this morning. Chest tightness. History of lung cancer. Ex-smoker. EXAM: PORTABLE CHEST 1 VIEW COMPARISON:  06/28/2019. FINDINGS: Stable enlarged cardiac silhouette and tortuous and calcified thoracic aorta. Stable surgical clips and staples on the left. Stable coronary artery stent. Stable mild prominence of the interstitial markings with normal vascularity. Stable mild elevation of the left hemidiaphragm and small amount of pleural scarring at the left lung base laterally. Interval minimal linear atelectasis at the right lung base. Diffuse osteopenia and mild scoliosis. Atheromatous arterial calcifications. IMPRESSION: 1. Interval minimal linear atelectasis at the right lung base. 2. Stable cardiomegaly and mild chronic interstitial lung disease. Electronically Signed   By: Claudie Revering M.D.   On: 07/02/2019 13:31    Pending Labs Unresulted Labs (From admission, onward)    Start     Ordered   07/02/19 1531  HIV antibody  Once,   STAT     07/02/19 1541   07/02/19 1240  SARS Coronavirus 2 [facility] order, Performed in [facility] hospital lab) Nasopharyngeal Nasopharyngeal Swab  (Symptomatic/High Risk of Exposure/Tier 1 Patients Labs with Precautions)  Once,   STAT    Question Answer Comment  Is this test for diagnosis or screening Diagnosis of ill patient   Symptomatic for COVID-19 as defined by CDC Yes   Date of Symptom Onset 07/02/2019   Hospitalized for COVID-19 No   Admitted to ICU for COVID-19 No   Previously tested for COVID-19 Yes   Resident in a congregate (group) care setting No   Employed in healthcare setting No   Pregnant No       07/02/19 1240          Vitals/Pain Today's Vitals   07/02/19 1220 07/02/19 1223 07/02/19 1521  BP:  (!) 160/95   Pulse:  (!) 112   Resp:  (!) 22   Temp:  98.1 F (36.7 C)   TempSrc:  Oral   SpO2:  97%   PainSc: 5   6     Isolation Precautions No active isolations  Medications Medications  0.9 %  sodium chloride infusion (has no administration in time range)  methylPREDNISolone sodium succinate (SOLU-MEDROL) 125 mg/2 mL injection 125 mg (has no administration in time range)  heparin injection 5,000 Units (has no administration in time range)  ipratropium-albuterol (DUONEB) 0.5-2.5 (3) MG/3ML nebulizer solution 3 mL (has no administration in time range)    Mobility walks High fall risk   Focused Assessments    R Recommendations: See Admitting Provider Note  Report given to:   Additional Notes:

## 2019-07-02 NOTE — Consult Note (Signed)
Reason for Consult: Volume overload/dyspnea in patient with ESRD Referring Physician: Lenice Pressman, MD ([facility])   HPI:  68 year old Caucasian woman with past medical history significant for end-stage renal disease on hemodialysis, chronic obstructive lung disease on home oxygen, hypertension, history of RAS, combined systolic/diastolic congestive heart failure, history of lung cancer status post left lower lobectomy, dyslipidemia, history of alcohol abuse/tobacco use and depression/anxiety.  She was recently discharged from the hospital 3 days ago after a brief admission for dyspnea attributed to COPD exacerbation/volume overload after having missed several dialysis treatments.  She did not go to her scheduled dialysis treatment on 06/30/2019-earlier told the admitting service that it was because of transportation issues but upon my questioning "could not remember why".   She reports increasing shortness of breath over the past 24 hours that partially improved with nebulizers however, did not show any improvement today prompting her to present to the emergency room.  She has a chronic cough with intermittent sputum production but denies any hemoptysis, fever or chills.  She denies any nausea, vomiting or diarrhea.  She has been on hemodialysis for the past 4 months and adherence has been a challenge.  She denies suicidal ideation and exclaims that "this is the first that she is hearing of how important dialysis is".  She still produces some urine (about 16 ounces a day).  Dialysis prescription: [facility] on MWF. Time: 3:30 hr; 180NRe; BFR 350/DFR Auto 1.5; EDW 68.5; 2K/2Ca; no heparin; Right RCF Aranesp 60 mcg  Auryxia 210 mg 2 tabs TID with meals  Past Medical History:  Diagnosis Date  . Alcohol abuse   . Arthralgia   . CAD (coronary artery disease)   . Chronic combined systolic and diastolic HF (heart failure) ([place])   . COPD (chronic obstructive pulmonary disease) ([place])    . Depression   . Dyspnea   . ESRD (end stage renal disease) ([place])   . HTN (hypertension)   . Hypercholesterolemia    primarily ldl-p and small particles  . Lung cancer [facility])    Patient reports this with surgery.  No details.    [name] RAS (renal artery stenosis) ([place]) 2002   by cath tysinger  . Smoking     Past Surgical History:  Procedure Laterality Date  . abdominal aortogram     perclose of the right femoral artery  . AV FISTULA PLACEMENT Right 07/11/2018   Procedure: Right arm ARTERIOVENOUS FISTULA CREATION;  Surgeon: [name], MD;  Location: [place];  Service: Vascular;  Laterality: Right;  . AVF placement Right   . CARDIAC CATHETERIZATION     left heart catheterization.  Coronary cineangiography. Lft ventricular cineangiography.    [name] LUNG SURGERY     due to lung cancer per patient's report  . TUBAL LIGATION      Family History  Problem Relation Age of Onset  . Emphysema Father     Social History:  reports that she quit smoking about 6 weeks ago. Her smoking use included cigarettes. She quit after 35.00 years of use. She has never used smokeless tobacco. She reports that she does not drink alcohol or use drugs.  Allergies:  Allergies  Allergen Reactions  . Citalopram Hydrobromide Hives  . Morphine And Related Itching    Medications:  Scheduled: . [START ON 07/03/2019] Chlorhexidine Gluconate Cloth  6 each Topical Q0600  . heparin  5,000 Units Subcutaneous Q8H  . ipratropium-albuterol  3 mL Nebulization Q6H    BMP Latest Ref  Rng & Units 07/02/2019 06/29/2019 06/28/2019  Glucose 70 - 99 mg/dL 121(H) 111(H) 103(H)  BUN 8 - 23 mg/dL 96(H) 59(H) 112(H)  Creatinine 0.44 - 1.00 mg/dL 10.23(H) 7.25(H) 11.26(H)  Sodium 135 - 145 mmol/L 139 141 141  Potassium 3.5 - 5.1 mmol/L 5.4(H) 4.5 5.2(H)  Chloride 98 - 111 mmol/L 105 103 111  CO2 22 - 32 mmol/L 17(L) 24 14(L)  Calcium 8.9 - 10.3 mg/dL 9.4 9.1 9.6   CBC Latest Ref Rng & Units 07/02/2019 06/28/2019 06/20/2019  WBC  4.0 - 10.5 K/uL 14.6(H) 7.2 8.9  Hemoglobin 12.0 - 15.0 g/dL 10.0(L) 8.9(L) 10.7(L)  Hematocrit 36.0 - 46.0 % 31.9(L) 29.2(L) 34.1(L)  Platelets 150 - 400 K/uL 254 223 234     Dg Chest Port 1 View  Result Date: 07/02/2019 CLINICAL DATA:  Shortness of breath since this morning. Chest tightness. History of lung cancer. Ex-smoker. EXAM: PORTABLE CHEST 1 VIEW COMPARISON:  06/28/2019. FINDINGS: Stable enlarged cardiac silhouette and tortuous and calcified thoracic aorta. Stable surgical clips and staples on the left. Stable coronary artery stent. Stable mild prominence of the interstitial markings with normal vascularity. Stable mild elevation of the left hemidiaphragm and small amount of pleural scarring at the left lung base laterally. Interval minimal linear atelectasis at the right lung base. Diffuse osteopenia and mild scoliosis. Atheromatous arterial calcifications. IMPRESSION: 1. Interval minimal linear atelectasis at the right lung base. 2. Stable cardiomegaly and mild chronic interstitial lung disease. Electronically Signed   By: Claudie Revering M.D.   On: 07/02/2019 13:31    Review of Systems  Constitutional: Positive for malaise/fatigue. Negative for chills, fever and weight loss.  HENT: Negative.   Eyes: Negative.   Respiratory: Positive for cough and shortness of breath. Negative for hemoptysis and sputum production.   Cardiovascular: Negative for chest pain, orthopnea, claudication and leg swelling.  Gastrointestinal: Negative for abdominal pain, nausea and vomiting.  Genitourinary: Negative for dysuria, flank pain, frequency and urgency.  Musculoskeletal: Negative for back pain and joint pain.  Skin: Negative.   Neurological: Negative.    Blood pressure (!) 176/102, pulse (!) 124, temperature 98.1 F (36.7 C), temperature source Oral, resp. rate (!) 25, SpO2 98 %. Physical Exam  Nursing note and vitals reviewed. Constitutional: She is oriented to person, place, and time. She  appears well-developed and well-nourished.  Appears much older than stated age, chronically ill.  HENT:  Head: Normocephalic and atraumatic.  Mouth/Throat: Oropharynx is clear and moist. No oropharyngeal exudate.  Eyes: Pupils are equal, round, and reactive to light. Conjunctivae and EOM are normal. No scleral icterus.  Neck: Normal range of motion. Neck supple. No JVD present.  Cardiovascular: Regular rhythm.  No murmur heard. Regular tachycardia  Respiratory: She has no wheezes. She has no rales.  Tachypneic with poor inspiratory effort and adventitious breath sounds  GI: Soft. Bowel sounds are normal. There is no abdominal tenderness. There is no rebound and no guarding.  Musculoskeletal: Normal range of motion.        General: No edema.     Comments: Right radiocephalic fistula, suboptimal augmentation  Neurological: She is alert and oriented to person, place, and time.  Skin: Skin is warm and dry. No rash noted.    Assessment/Plan: 1.  Dyspnea/volume overload: Secondary to missed hemodialysis treatment and likely poor restriction of inter-dialytic fluid intake.  She is probably sensitive to slight volume excess is due to her tenuous cardiac/pulmonary status leading to her presentation at this time.  Plan  to manage with urgent hemodialysis/ultrafiltration.  May need additional evaluation with her leukocytosis for possible infection focus.  Usually on a Monday/Wednesday/Friday schedule for end-stage renal disease. 2.  Hyperkalemia: Secondary to missed hemodialysis.  Treat with hemodialysis and reeducated regarding potassium restriction. 3.  Hypertension: Significantly elevated blood pressure likely fueled by anxiety/volume excess, managed with hemodialysis and restart oral antihypertensive therapy. 4.  Anemia: Hemoglobin appears to be better compared to last week values, monitor trend and reassess ESA dosing. 5.  Secondary hyperparathyroidism: Resume phosphorus binders along with renal  diet.  Resume VDRA for PTH control.  Ebone Alcivar K. 07/02/2019, 4:58 PM

## 2019-07-03 DIAGNOSIS — Z9115 Patient's noncompliance with renal dialysis: Secondary | ICD-10-CM

## 2019-07-03 DIAGNOSIS — Z992 Dependence on renal dialysis: Secondary | ICD-10-CM

## 2019-07-03 DIAGNOSIS — Z87891 Personal history of nicotine dependence: Secondary | ICD-10-CM

## 2019-07-03 DIAGNOSIS — Z7982 Long term (current) use of aspirin: Secondary | ICD-10-CM

## 2019-07-03 DIAGNOSIS — E875 Hyperkalemia: Secondary | ICD-10-CM

## 2019-07-03 DIAGNOSIS — N186 End stage renal disease: Secondary | ICD-10-CM

## 2019-07-03 DIAGNOSIS — E785 Hyperlipidemia, unspecified: Secondary | ICD-10-CM

## 2019-07-03 DIAGNOSIS — D539 Nutritional anemia, unspecified: Secondary | ICD-10-CM

## 2019-07-03 DIAGNOSIS — Z79899 Other long term (current) drug therapy: Secondary | ICD-10-CM

## 2019-07-03 DIAGNOSIS — Z85118 Personal history of other malignant neoplasm of bronchus and lung: Secondary | ICD-10-CM

## 2019-07-03 DIAGNOSIS — Z95820 Peripheral vascular angioplasty status with implants and grafts: Secondary | ICD-10-CM

## 2019-07-03 DIAGNOSIS — I132 Hypertensive heart and chronic kidney disease with heart failure and with stage 5 chronic kidney disease, or end stage renal disease: Secondary | ICD-10-CM

## 2019-07-03 DIAGNOSIS — Z7902 Long term (current) use of antithrombotics/antiplatelets: Secondary | ICD-10-CM

## 2019-07-03 DIAGNOSIS — I4891 Unspecified atrial fibrillation: Secondary | ICD-10-CM

## 2019-07-03 DIAGNOSIS — G4700 Insomnia, unspecified: Secondary | ICD-10-CM

## 2019-07-03 DIAGNOSIS — I252 Old myocardial infarction: Secondary | ICD-10-CM

## 2019-07-03 DIAGNOSIS — I5020 Unspecified systolic (congestive) heart failure: Secondary | ICD-10-CM

## 2019-07-03 DIAGNOSIS — Z902 Acquired absence of lung [part of]: Secondary | ICD-10-CM

## 2019-07-03 DIAGNOSIS — J449 Chronic obstructive pulmonary disease, unspecified: Secondary | ICD-10-CM

## 2019-07-03 LAB — CBC
HCT: 32.4 % — ABNORMAL LOW (ref 36.0–46.0)
Hemoglobin: 10.2 g/dL — ABNORMAL LOW (ref 12.0–15.0)
MCH: 33.9 pg (ref 26.0–34.0)
MCHC: 31.5 g/dL (ref 30.0–36.0)
MCV: 107.6 fL — ABNORMAL HIGH (ref 80.0–100.0)
Platelets: 262 10*3/uL (ref 150–400)
RBC: 3.01 MIL/uL — ABNORMAL LOW (ref 3.87–5.11)
RDW: 18.3 % — ABNORMAL HIGH (ref 11.5–15.5)
WBC: 10 10*3/uL (ref 4.0–10.5)
nRBC: 0.6 % — ABNORMAL HIGH (ref 0.0–0.2)

## 2019-07-03 LAB — RENAL FUNCTION PANEL
Albumin: 3.8 g/dL (ref 3.5–5.0)
Anion gap: 14 (ref 5–15)
BUN: 55 mg/dL — ABNORMAL HIGH (ref 8–23)
CO2: 24 mmol/L (ref 22–32)
Calcium: 9.2 mg/dL (ref 8.9–10.3)
Chloride: 100 mmol/L (ref 98–111)
Creatinine, Ser: 6.83 mg/dL — ABNORMAL HIGH (ref 0.44–1.00)
GFR calc Af Amer: 7 mL/min — ABNORMAL LOW (ref >60)
GFR calc non Af Amer: 6 mL/min — ABNORMAL LOW (ref >60)
Glucose, Bld: 122 mg/dL — ABNORMAL HIGH (ref 70–99)
Phosphorus: 5.6 mg/dL — ABNORMAL HIGH (ref 2.5–4.6)
Potassium: 4.3 mmol/L (ref 3.5–5.1)
Sodium: 138 mmol/L (ref 135–145)

## 2019-07-03 MED ORDER — FLUTICASONE FUROATE-VILANTEROL 200-25 MCG/INH IN AEPB
1.0000 | INHALATION_SPRAY | Freq: Every day | RESPIRATORY_TRACT | Status: DC
  Administered 2019-07-03 – 2019-07-11 (×7): 1 via RESPIRATORY_TRACT
  Filled 2019-07-03: qty 28

## 2019-07-03 MED ORDER — NEPRO/CARBSTEADY PO LIQD
237.0000 mL | ORAL | Status: DC
  Administered 2019-07-03 – 2019-07-11 (×9): 237 mL via ORAL

## 2019-07-03 MED ORDER — IPRATROPIUM-ALBUTEROL 0.5-2.5 (3) MG/3ML IN SOLN
3.0000 mL | Freq: Four times a day (QID) | RESPIRATORY_TRACT | Status: AC
  Administered 2019-07-03 (×2): 3 mL via RESPIRATORY_TRACT
  Filled 2019-07-03 (×3): qty 3

## 2019-07-03 MED ORDER — RENA-VITE PO TABS
1.0000 | ORAL_TABLET | Freq: Every day | ORAL | Status: DC
  Administered 2019-07-03 – 2019-07-11 (×9): 1 via ORAL
  Filled 2019-07-03 (×9): qty 1

## 2019-07-03 MED ORDER — TIOTROPIUM BROMIDE MONOHYDRATE 18 MCG IN CAPS: 18.0000 ug | ORAL_CAPSULE | Freq: Every day | RESPIRATORY_TRACT | Status: DC

## 2019-07-03 MED ORDER — CINACALCET HCL 30 MG PO TABS
30.0000 mg | ORAL_TABLET | ORAL | Status: DC
  Administered 2019-07-03 – 2019-07-10 (×4): 30 mg via ORAL
  Filled 2019-07-03 (×7): qty 1

## 2019-07-03 NOTE — Progress Notes (Signed)
Subjective: Pt seen at the bedside this morning, before going to dialysis session. States she feels her breathing and cough are improved from yesterday. Has no acute concerns. On 5L [place] in the room.  Objective:  Vital signs in last 24 hours: Vitals:   07/02/19 2350 07/03/19 0146 07/03/19 0207 07/03/19 0300  BP: 104/88   (!) 111/94  Pulse: (!) 104 (!) 119 64 (!) 106  Resp: 18 (!) 22 (!) 21 16  Temp: (!) 97.4 F (36.3 C) 98.1 F (36.7 C)  97.6 F (36.4 C)  TempSrc: Oral Oral  Oral  SpO2: 99% 96% 96% 98%  Weight:  67.5 kg    Height:  5\' 4"  (1.626 m)    Physical Exam Vitals signs and nursing note reviewed.  Constitutional:      General: She is not in acute distress.    Appearance: She is ill-appearing (chronically).  Cardiovascular:     Rate and Rhythm: Regular rhythm. Tachycardia present.     Heart sounds: Normal heart sounds. No murmur. No friction rub. No gallop.   Pulmonary:     Effort: Pulmonary effort is normal. No tachypnea.     Breath sounds: No decreased air movement.     Comments: End expiratory wheezes throughout, scattered rhonchi, and diminished breath sounds at the bases. Abdominal:     General: Abdomen is flat. Bowel sounds are normal. There is no distension.     Palpations: Abdomen is soft.     Tenderness: There is no abdominal tenderness.  Musculoskeletal:     Right lower leg: No edema.     Left lower leg: No edema.  Neurological:     Mental Status: She is alert.  Psychiatric:        Mood and Affect: Mood normal.     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 Volume overload due to missed dialysis sessions appears to be driving her dyspnea this admission, but  underlying COPD and anxiety also contributing.   ESRD on HD (MWF)- Pt misseddialysissince discharge on 8/13. Pt has given a variety of reasons for missing dialysis, including stressors at home, transportation ?issues, generalized weakness, and diarrhea weeks ago. Has spoken extensively to social work and the medical team during her multiple hospitalizations regarding these barriers and the importance of making outpatient dialysis appointments. - got dialysis yesterday, where they removed 2.5L K 5.4 and BUN 96 on admission, down to 4.3 and 55 this morning respectively Nephrology following, appreciate recommendations.             - sensitive to slight volume excess due to her tenuous cardiac/pulmonary status             - hemodialysis again today and reassess  - encouraged consideration of moving in with family/SNF to improve dialysis compliance  - PT eval  Hyperkalemia - K 5.4 on admission - resolved with hemodialysis  HTN - resolved with dialysis Elevated pressures on admission secondary to anxiety and volume overload - on home metoprolol 50mg  PO daily and isosobide mononitrate 30mg  PO daily qhs   Macrocytic anemia- Hgb stable ~10 Hgb is appears chronically low with baseline 9-10?[name] MCV elevated 109 - received 57mcg aranesp with  HD on 8/12 - aranesp as needed, per nephrology   COPD - pt's cough and sputum production unchanged, dyspnea does not appear to be due to exacerbation. Will restart home inhalers, no need for steroids at this time. - Breo inhaler 1 puff daily -duonebs (ipratropium-albuterol) q6hr - received solumedrol 125mg  IV once in ED  STEMI w/ stent placement x2 - ASA 81mg and plavix 75mg daily  Insomnia - continue home seroquel 200mg  qhs   Diet - Renal Diet1251mL fluid restriction DVT ppx- heparin 5,000 units subq q8h Fluids - none CODE STATUS- FULL CODE   Dispo: pending further need for dialysis while inpatient    Ladona Horns, MD 07/03/2019,  6:30 AM Pager: [phone]

## 2019-07-03 NOTE — Progress Notes (Signed)
[facility] Progress Note  Subjective: pt states missed one HD due to diarrhea and the other because "my legs were too weak to get on the [employer] bus", and her boyfriend whom she lives with was at work.  Her family is all in [place].   deneis any CP or SOB.   Vitals:   07/03/19 0906 07/03/19 1000 07/03/19 1007 07/03/19 1030  BP:  128/70 95/62 93/63   Pulse:  90 100 100  Resp:  (!) 21 (!) 26 16  Temp:  97.8 F (36.6 C)    TempSrc:  Oral    SpO2: 93% 100% 100% 98%  Weight:  69.9 kg    Height:        Inpatient medications: . aspirin EC  81 mg Oral Daily  . buPROPion  150 mg Oral BID WC  . Chlorhexidine Gluconate Cloth  6 each Topical Q0600  . cinacalcet  30 mg Oral Q M,W,F-1800  . clopidogrel  75 mg Oral Daily  . [START ON 07/04/2019] fluticasone furoate-vilanterol  1 puff Inhalation Daily  . heparin  5,000 Units Subcutaneous Q8H  . ipratropium-albuterol  3 mL Nebulization Q6H  . isosorbide mononitrate  30 mg Oral QHS  . metoprolol succinate  50 mg Oral Daily  . QUEtiapine  200 mg Oral QHS  . [START ON 07/04/2019] tiotropium  18 mcg Inhalation Daily   . sodium chloride         Exam:  alert,  No distress  +jvd  Chest L rales, R cta  Cor reg no RG  Abd soft ntnd  Ext 1+ edema  RUE RCF+bruit   CXR 8/16 1pm - IMPRESSION: Interval minimal linear atelectasis at the right lung base. 2. Stable cardiomegaly and mild chronic interstitial lung disease.   Dialysis: [facility] M[facility]   3.5h  350/1.5  68.5kg  2/2  Hep none  RUE RCF  aranesp 60 ug /wk  auryxia 210 tid ac   Assessment/ Plan: 1. Dyspnea/ vol overload - CXR borderline. Has underlying combined CHF and COPD so likely is more sensitive to vol overload.  Missing HD which is probably the main issue. Had HD here overnight, plan HD again today then reassess. Pt has memory issues and poor support system.  Encouraged her to consider moving in w/ family, SNF placement or some other idea to improve her likelihood of getting  to dialysis regularly.  2. Hyperkalemia -resolving 3. HTN - resume home BP meds 4. Anemia ckd - esa as needed 5. MBD ckd - cont meds 6. COPD  7. H/o STEMI w/ stent placement x 2     [facility] 07/03/2019, 11:02 AM  Iron/TIBC/Ferritin/ %Sat    Component Value Date/Time   IRON 70 06/17/2019 1128   TIBC 265 06/17/2019 1128   FERRITIN 1,208 (H) 06/17/2019 1128   IRONPCTSAT 26 06/17/2019 1128   Recent Labs  Lab 07/03/19 1021  NA 138  K 4.3  CL 100  CO2 24  GLUCOSE 122*  BUN 55*  CREATININE 6.83*  CALCIUM 9.2  PHOS 5.6*  ALBUMIN 3.8   No results for input(s): AST, ALT, ALKPHOS, BILITOT, PROT in the last 168 hours. Recent Labs  Lab 07/03/19 1020  WBC 10.0  HGB 10.2*  HCT 32.4*  PLT 262

## 2019-07-03 NOTE — Progress Notes (Signed)
  Date: 07/03/2019  Patient name: Kari Robinson  Medical record number: [id number]  Date of birth: 18-Apr-1951   I have seen and evaluated this patient and I have discussed the plan of care with the house staff. Please see their note for complete details. I concur with their findings with the following additions/corrections:   68 yo woman with ESRD on HD, COPD, prior lobectomy, HFrEF and prior stenting, readmitted for dyspnea. Unfortunately, 3rd admission in the past month for similar presentation, primarily due to missing dialysis sessions. Did not go to dialysis on Friday, unclear exactly why. Likely volume overloaded again, feels much better after dialysis last night, going again for dialysis today. Although significant wheezing on exam, suspect primarily volume overloaded. Will need to figure out how to avoid missed dialysis and resulting frequent readmissions.   Lenice Pressman, M.D., Ph.D. 07/03/2019, 12:16 PM

## 2019-07-03 NOTE — Progress Notes (Signed)
Pt states that the first time she missed dialysis she had diarrhea and did not want to have diarrhea at dialysis - states that the 2nd time she missed dialysis her "legs didn't work" and was "too weak" and ended up "falling twice in dialysis"

## 2019-07-03 NOTE — Progress Notes (Signed)
PT Cancellation Note  Patient Details Name: Braylynn Lewing MRN: [id number] DOB: [birth year]   Cancelled Treatment:    Reason Eval/Treat Not Completed: Patient at procedure or test/unavailable. Pt in HD. PT to re-attempt eval as time allows.   Lorriane Shire 07/03/2019, 11:11 AM   Lorrin Goodell, PT  Office # [phone] Pager [phone]

## 2019-07-03 NOTE — Progress Notes (Signed)
Initial Nutrition Assessment  DOCUMENTATION CODES:   Not applicable  INTERVENTION:  -Nepro Shake po daily, each supplement provides 425 kcal and 19 grams protein -Rena-vit   NUTRITION DIAGNOSIS:   Increased nutrient needs related to chronic illness(ESRD on HD) as evidenced by estimated needs.   GOAL:   Patient will meet greater than or equal to 90% of their needs   MONITOR:   PO intake, Labs, I & O's, Supplement acceptance, Weight trends  REASON FOR ASSESSMENT:   Consult Assessment of nutrition requirement/status  ASSESSMENT:  RD working remotely.  68 year old female with medical history significant of COPD with multiple prior hospitalizations for exacerbations (most recently 9 days ago), left lower lung lobectomy s/p lung cancer in 2017, ESRD on HD, HTN, HLD, STEMI w/ stent placement x 2, Afib w RVR, who presented to ED for ongoing SOB since her discharge on 8/13.   HD  [place] MWF EDW 65.1 kg Current wt 69.9 kg (+1; BLE)  Unable to reach patient via phone today; per chart review pt in HD. Per nephrology note, Patient reported missing her dialysis appointment on Friday due to diarrhea and stated that also her legs were to weak to get on the [facility] bus. Patient has been on HD x 4 months; adherence has been a challenge. Patient with 3 admissions in the past month with similar presentation - primarily due to missing HD sessions. Efforts to figure out how to avoid missing dialysis that are resulting frequent readmissions are underway per MD note.   Eating 75% x 1 recorded meal RD to order Nepro daily and Rena-vit to assist with calorie and protein needs (listed below)  Patient still produces urine (about 16 ounces/day) No UOP documented at this time  Medications reviewed and include: sensipar, plavix, imdur  Labs: Phosphorus 5.6    NUTRITION - FOCUSED PHYSICAL EXAM: Unable to complete at this time   Diet Order:   Diet Order            Diet renal with fluid  restriction Fluid restriction: 1200 mL Fluid; Room service appropriate? Yes; Fluid consistency: Thin  Diet effective now              EDUCATION NEEDS:   No education needs have been identified at this time  Skin:  Skin Assessment: Reviewed RN Assessment  Last BM:  8/12  Height:   Ht Readings from Last 1 Encounters:  07/03/19 5\' 4"  (1.626 m)    Weight:   Wt Readings from Last 1 Encounters:  07/03/19 69.9 kg    Ideal Body Weight:     BMI:  Body mass index is 26.45 kg/m.  Estimated Nutritional Needs:   Kcal:  2075-2275  Protein:  104-114  Fluid:  1.2 L/day per MD   Lajuan Lines, RD, LDN [facility] Telephone [phone] After Hours/Weekend Pager: [phone]

## 2019-07-04 ENCOUNTER — Ambulatory Visit: Payer: Medicare Other | Admitting: Physician Assistant

## 2019-07-04 ENCOUNTER — Encounter (HOSPITAL_COMMUNITY): Payer: Self-pay | Admitting: Nephrology

## 2019-07-04 DIAGNOSIS — I2510 Atherosclerotic heart disease of native coronary artery without angina pectoris: Secondary | ICD-10-CM

## 2019-07-04 DIAGNOSIS — Z955 Presence of coronary angioplasty implant and graft: Secondary | ICD-10-CM

## 2019-07-04 LAB — HEPATITIS B SURFACE ANTIGEN: Hepatitis B Surface Ag: NEGATIVE

## 2019-07-04 LAB — HEPATITIS B SURFACE ANTIBODY,QUALITATIVE: Hep B S Ab: REACTIVE

## 2019-07-04 LAB — HIV ANTIBODY (ROUTINE TESTING W REFLEX): HIV Screen 4th Generation wRfx: NONREACTIVE

## 2019-07-04 MED ORDER — CHLORHEXIDINE GLUCONATE CLOTH 2 % EX PADS
6.0000 | MEDICATED_PAD | Freq: Every day | CUTANEOUS | Status: DC
  Administered 2019-07-04 – 2019-07-05 (×2): 6 via TOPICAL

## 2019-07-04 MED ORDER — IPRATROPIUM-ALBUTEROL 0.5-2.5 (3) MG/3ML IN SOLN
3.0000 mL | Freq: Four times a day (QID) | RESPIRATORY_TRACT | Status: DC
  Administered 2019-07-04 – 2019-07-08 (×15): 3 mL via RESPIRATORY_TRACT
  Filled 2019-07-04 (×15): qty 3

## 2019-07-04 MED ORDER — PANTOPRAZOLE SODIUM 20 MG PO TBEC
20.0000 mg | DELAYED_RELEASE_TABLET | Freq: Every day | ORAL | Status: DC
  Administered 2019-07-04 – 2019-07-12 (×9): 20 mg via ORAL
  Filled 2019-07-04 (×10): qty 1

## 2019-07-04 MED ORDER — UMECLIDINIUM BROMIDE 62.5 MCG/INH IN AEPB
1.0000 | INHALATION_SPRAY | Freq: Every day | RESPIRATORY_TRACT | Status: DC
  Administered 2019-07-04 – 2019-07-11 (×5): 1 via RESPIRATORY_TRACT
  Filled 2019-07-04: qty 7

## 2019-07-04 MED ORDER — ALBUTEROL SULFATE (2.5 MG/3ML) 0.083% IN NEBU
2.5000 mg | INHALATION_SOLUTION | RESPIRATORY_TRACT | Status: DC | PRN
  Administered 2019-07-10: 2.5 mg via RESPIRATORY_TRACT
  Filled 2019-07-04: qty 3

## 2019-07-04 NOTE — Progress Notes (Signed)
[facility] Progress Note  Subjective: BP's dropped on HD yest into 80's, only 1 L UF . Today is coughing, no new c/o's.    Vitals:   07/04/19 0321 07/04/19 0805 07/04/19 0822 07/04/19 1142  BP: 103/71     Pulse:      Resp:      Temp: (!) 97.5 F (36.4 C)  97.9 F (36.6 C)   TempSrc: Oral  Oral   SpO2:  100%  96%  Weight:      Height:        Inpatient medications: . aspirin EC  81 mg Oral Daily  . buPROPion  150 mg Oral BID WC  . Chlorhexidine Gluconate Cloth  6 each Topical Q0600  . cinacalcet  30 mg Oral Q M,W,F-1800  . clopidogrel  75 mg Oral Daily  . feeding supplement (NEPRO CARB STEADY)  237 mL Oral Q24H  . fluticasone furoate-vilanterol  1 puff Inhalation Daily  . heparin  5,000 Units Subcutaneous Q8H  . ipratropium-albuterol  3 mL Nebulization QID  . isosorbide mononitrate  30 mg Oral QHS  . metoprolol succinate  50 mg Oral Daily  . multivitamin  1 tablet Oral QHS  . QUEtiapine  200 mg Oral QHS  . umeclidinium bromide  1 puff Inhalation Daily   . sodium chloride         Exam:  alert,  No distress  +jvd  Chest L rales, R cta  Cor reg no RG  Abd soft ntnd  Ext 1+ edema  RUE RCF+bruit   CXR 8/16 1pm - IMPRESSION: Interval minimal linear atelectasis at the right lung base. 2. Stable cardiomegaly and mild chronic interstitial lung disease.   Dialysis: [facility] M[facility]   3.5h  350/1.5  68.5kg  2/2  Hep none  RUE RCF  aranesp 60 ug /wk  auryxia 210 tid ac   Assessment/ Plan: 1. Dyspnea/ vol overload - CXR borderline. Has underlying combined CHF and COPD so likely is more sensitive to vol overload from missed HD. With HD x 2 is down to dry wt and breathing better. BP's dropped w/ HD yest limiting UF, will dc metoprolol since is in NSR here. Try to get vol down again tomorrow.  2. ESRD - on HD MWF. Missing HD. Pt has memory issues and poor support system.  Lives w/ boyfriend who works part-time. We encouraged her to consider moving in w/ family, SNF  placement or some other idea to improve her likelihood of getting to dialysis regularly.  3. COPD - on home O2 at 3 L [place] 4. HTN - resume home BP meds 5. Anemia ckd - esa as needed 6. MBD ckd - cont meds 7. H/o STEMI w/ stent placement x 2     [facility] 07/04/2019, 11:50 AM  Iron/TIBC/Ferritin/ %Sat    Component Value Date/Time   IRON 70 06/17/2019 1128   TIBC 265 06/17/2019 1128   FERRITIN 1,208 (H) 06/17/2019 1128   IRONPCTSAT 26 06/17/2019 1128   Recent Labs  Lab 07/03/19 1021  NA 138  K 4.3  CL 100  CO2 24  GLUCOSE 122*  BUN 55*  CREATININE 6.83*  CALCIUM 9.2  PHOS 5.6*  ALBUMIN 3.8   No results for input(s): AST, ALT, ALKPHOS, BILITOT, PROT in the last 168 hours. Recent Labs  Lab 07/03/19 1020  WBC 10.0  HGB 10.2*  HCT 32.4*  PLT 262

## 2019-07-04 NOTE — Progress Notes (Signed)
Renal Navigator notes patient's readmission and missed OP HD treatment again between hospitalizations. Renal Navigator met with patient to offer support and discuss possible barriers to missed OP HD treatment. Patient seems very forgetful and does not recall which day she missed dialysis for what reason. She again tells Renal Navigator that her diarrhea was so bad that she could not go to HD at the clinic. This was the same reason she gave Renal Navigator as the reason she missed OP HD prior to last hospitalization. She continues to report that transportation is NOT a barrier and that she is set up with [employer] with no issue. She says her diarrhea is now gone, so she can return to OP HD. Renal Navigator encouraged her again to speak with her doctor about what to do in the event she has diarrhea on a dialysis day instead of coming to the hospital and missing OP HD treatment. She then states "and one day my legs didn't work." Renal Navigator talked to patient about supports and equipment she has at home and what she feels could be useful if it is available to her. She reports that she has a Insurance claims handler, shower chair, grabber, and bedside potty." Renal Navigator questions if patient is eligible for a wheelchair and will discuss this with Case Manager. Case Manager called patient while Renal Navigator was in the room and patient immediately handed the phone to Renal Navigator and asked for Renal Navigator to talk with Case Manager. Renal Navigator asked patient how things are going at her own place as his moving out of her boyfriend's apartment back to her own was her plan at discharge last week. She states it is going well and that they are still in communication, but she is living in her own apartment and he is coming over there to assist her. She denies any further verbal abuse at this time. Renal Navigator asked patient which apartment is her's (Apartment B or D, as we have D listed in Epic, but she  requested to be taken to B with the cab voucher provided last week). She states her apartment is D and his is B.  Dr. Ronnald Ramp came in the room to speak with patient while Renal Navigator was still in the room. We discussed care as a team and goal of getting patient the supports needed to care for herself in the community to avoid further hospitalizations. Renal Navigator was extremely frank in telling patient that she will not be able to stay out of the hospital if she does not attend OP HD. In one breath, patient reports that we have "put the fear of God in me," and the next she reports, "no one has told me how serious going to dialysis is." Dr. Ronnald Ramp suggests having dialysis here tomorrow in the recliner and patient immediate said "no!" We both explained that this way we can help make adjustments with pillows, etc to try to make her as comfortable as possible so that she can take these tips back to the OP HD clinic with her. She then agreed. Renal Navigator spoke with Acute HD unit and patient's RN to ask that patient receive HD in recliner on 07/05/19.  Renal Navigator feels there is definitely some attention seeking behavior happening with patient and Renal Navigator worries that she is returning to the hospital for dialysis because it is more comfortable for her.  Renal Navigator following for PT recommendations and will follow up with Case Manager as well as provide  update to OP HD clinic/[facility] at discharge to provide continuity of care.  Alphonzo Cruise, [place] Renal Navigator [phone]

## 2019-07-04 NOTE — Progress Notes (Signed)
  Date: 07/04/2019  Patient name: Kari Robinson  Medical record number: [id number]  Date of birth: 03-09-1951   I have seen and evaluated this patient and I have discussed the plan of care with the house staff. Please see their note for complete details. I concur with their findings with the following additions/corrections:   Much improved after dialysis x2. Having chest pain today, checking EKG.  Lenice Pressman, M.D., Ph.D. 07/04/2019, 3:00 PM

## 2019-07-04 NOTE — Progress Notes (Signed)
Subjective: Pt seen at the bedside this morning. Complaining of sudden onset chest pain she describes as substernal pressure. States it feels like her angina pain she had years ago.  Objective:  Vital signs in last 24 hours: Vitals:   07/03/19 2340 07/03/19 2342 07/04/19 0050 07/04/19 0321  BP: (!) 76/60 (!) 79/49 102/60 103/71  Pulse:  85 79   Resp:   15   Temp:   97.9 F (36.6 C) (!) 97.5 F (36.4 C)  TempSrc:   Oral Oral  SpO2:   97%   Weight:      Height:      Physical Exam Vitals signs and nursing note reviewed.  Constitutional:      General: She is not in acute distress.    Appearance: She is ill-appearing (chronically).  Cardiovascular:     Rate and Rhythm: Normal rate and regular rhythm.     Heart sounds: Normal heart sounds. No murmur. No friction rub. No gallop.   Pulmonary:     Effort: No tachypnea.     Comments: On 4L [place] Abdominal:     General: Bowel sounds are normal.     Palpations: Abdomen is soft.  Musculoskeletal:     Right lower leg: No edema.     Left lower leg: No edema.  Skin:    General: Skin is warm and dry.  Neurological:     Mental Status: She is alert.     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 Volume overload due to missed dialysis sessions appears to be driving her dyspnea this admission, but underlying COPD and anxiety also contributing.    ESRD on HD (MWF)- Pt continuing to miss dialysis for a variety of reasons - got inpatient dialysis yesterday - only 1L removed due to low blood pressure  Nephrology following, appreciate recommendations. - encouraged consideration of moving in with family/SNF to improve dialysis compliance         - sensitive to slight volume excess due to her tenuous cardiac/pulmonary status             - hemodialysis again tomorrow  - aranesp as needed - PT eval for potential placement needs   HTN - resolved with dialysis, now with occasional hypotension Elevated pressures on admission secondary to anxiety and volume overload - holding home metoprolol 50mg  PO daily for dialysis session tomorrow - would benefit from beta-blockade in the future if pressures will tolerate it   CAD with STEMI w/ stent placement x2 Pt having chest pain this morning, low suspicious for acute ischemia as pain occurred when laying down after having breakfast. Not exertional. No associated shortness of breath or diaphoresis.  - repeat EKG showing normal sinus rhthym and LVH - ASA 81mg and plavix 75mg daily  - isosobide mononitrate 30mg  PO daily qhs  - start 20mg  pantoprazole daily   COPD - pt's cough and sputum production unchanged, dyspnea does not appear to be due to exacerbation. No need for steroid burst. - Breo inhaler 1 puff daily - Incruse inhaler 1 puff daily -duonebs (ipratropium-albuterol) q6hr - received solumedrol 125mg  IV once in ED  Insomnia - continue home seroquel 200mg  qhs   Diet -  Renal Diet124mL fluid restriction DVT ppx- heparin 5,000 units subq q8h Fluids - none CODE STATUS- FULL CODE   Dispo: pending further need for dialysis while inpatient   Ladona Horns, MD 07/04/2019, 6:37 AM Pager: [phone]

## 2019-07-04 NOTE — Evaluation (Signed)
Physical Therapy Evaluation Patient Details Name: Kari Robinson MRN: [id number] DOB: 22-Apr-1951 Today's Date: 07/04/2019   History of Present Illness  Pt adm with dyspnea due to volume overload due to missed HD sessions. PMHx: COPD, HTN, HLD, depression, ESRD on MWF.  Clinical Impression  Pt presents to PT with poor mobility due to weakness. Pt with tremulous extremities and buckling of knees. Pt with history of falling and of missing HD. Recommend SNF which pt reports she doesn't want to do. If pt refuses SNF would maximize [facility] services. Pt requesting to see the neurosurgeon she saw during a previous hospitalization saying she thinks the shaking is due to a herniated disk.     Follow Up Recommendations SNF(expect pt will refuse and then would maximize [facility])    Equipment Recommendations  None recommended by PT    Recommendations for Other Services       Precautions / Restrictions Precautions Precautions: Fall Precaution Comments: frequent falls at home Restrictions Weight Bearing Restrictions: No      Mobility  Bed Mobility Overal bed mobility: Modified Independent Bed Mobility: Supine to Sit;Sit to Supine     Supine to sit: Modified independent (Device/Increase time) Sit to supine: Modified independent (Device/Increase time)      Transfers Overall transfer level: Needs assistance Equipment used: 4-wheeled walker;1 person hand held assist Transfers: Sit to/from [facility] Sit to Stand: Min assist Stand pivot transfers: Min assist       General transfer comment: Assist for balance and support. Pt with very tremulous extremities and needs support.   Ambulation/Gait             General Gait Details: Pt with tremulous extremities and knees beginning to buckle in standing and with stepping in place so no amb attempted.   Stairs            Wheelchair Mobility    Modified Rankin (Stroke Patients Only)       Balance Overall balance  assessment: Needs assistance Sitting-balance support: No upper extremity supported;Feet supported Sitting balance-Leahy Scale: Good     Standing balance support: Bilateral upper extremity supported Standing balance-Leahy Scale: Poor Standing balance comment: walker and min assist for static standing. Extremities tremulous and knees buckling after 10-20 seconds in standing                             Pertinent Vitals/Pain Pain Assessment: No/denies pain    Home Living Family/patient expects to be discharged to:: Private residence Living Arrangements: Spouse/significant other Available Help at Discharge: Family;Available PRN/intermittently Type of Home: Apartment Home Access: Level entry     Home Layout: One level Home Equipment: Clinical cytogeneticist - 4 wheels;Grab bars - tub/shower;Toilet riser Additional Comments: Stays with her boyfriend mostly who lives one door down    Prior Function Level of Independence: Needs assistance   Gait / Transfers Assistance Needed: ambulates with rollator.  History of falls.            Hand Dominance   Dominant Hand: Right    Extremity/Trunk Assessment   Upper Extremity Assessment Upper Extremity Assessment: Generalized weakness    Lower Extremity Assessment Lower Extremity Assessment: Generalized weakness       Communication   Communication: No difficulties  Cognition Arousal/Alertness: Awake/alert Behavior During Therapy: WFL for tasks assessed/performed Overall Cognitive Status: History of cognitive impairments - at baseline  General Comments: Has demonstrated poor insight to her medical issues      General Comments      Exercises     Assessment/Plan    PT Assessment Patient needs continued PT services  PT Problem List Decreased strength;Decreased activity tolerance;Decreased balance;Decreased mobility       PT Treatment Interventions DME instruction;Gait  training;Functional mobility training;Therapeutic activities;Therapeutic exercise;Balance training;Patient/family education    PT Goals (Current goals can be found in the Care Plan section)  Acute Rehab PT Goals Patient Stated Goal: go home PT Goal Formulation: With patient Time For Goal Achievement: 07/18/19 Potential to Achieve Goals: Fair    Frequency Min 3X/week   Barriers to discharge Decreased caregiver support      Co-evaluation               AM-PAC PT "6 Clicks" Mobility  Outcome Measure Help needed turning from your back to your side while in a flat bed without using bedrails?: None Help needed moving from lying on your back to sitting on the side of a flat bed without using bedrails?: None Help needed moving to and from a bed to a chair (including a wheelchair)?: A Little Help needed standing up from a chair using your arms (e.g., wheelchair or bedside chair)?: A Little Help needed to walk in hospital room?: Total Help needed climbing 3-5 steps with a railing? : Total 6 Click Score: 16    End of Session Equipment Utilized During Treatment: Gait belt;Oxygen Activity Tolerance: Patient tolerated treatment well Patient left: in bed;with call bell/phone within reach Nurse Communication: Mobility status PT Visit Diagnosis: Unsteadiness on feet (R26.81);Other abnormalities of gait and mobility (R26.89);History of falling (Z91.81);Muscle weakness (generalized) (M62.81)    Time: 5397-6734 PT Time Calculation (min) (ACUTE ONLY): 16 min   Charges:   PT Evaluation $PT Eval Moderate Complexity: [place] Pager [phone] Office Ali Chuk 07/04/2019, 4:09 PM

## 2019-07-04 NOTE — TOC Initial Note (Signed)
Transition of Care [facility]) - Initial/Assessment Note    Patient Details  Name: Kari Robinson MRN: [id number] Date of Birth: 1951/02/03  Transition [facility]) CM/SW Contact:    Maryclare Labrador, RN Phone Number: 07/04/2019, 3:22 PM  Clinical Narrative:   PTA from home alone - pt is ESRD on outpt HD.  Pt is documented as missing HD treatments - pt denied barrier with transportation as she utilizes [employer]. CM contacted by [employer]; pt did not receive trilogy post last admit as ordered.  CM requested attending service to contact [facility] directly to ensure orders/notes are acceptable so pt will have equipment at discharge.  Pt is active with [facility]ings for HHPT - agency request the addition of [place] for disease management at discharge -CM requested [facility] orders from attending                Expected Discharge Plan: [place] [facility] Barriers to Discharge: Other (comment)   Patient Goals and CMS Choice        Expected Discharge Plan and Services Expected Discharge Plan: [place]       Living arrangements for the past 2 months: Single Family Home                                      Prior Living Arrangements/Services Living arrangements for the past 2 months: Single Family Home Lives with:: Self Patient language and need for interpreter reviewed:: Yes Do you feel safe going back to the place where you live?: Yes      Need for Family Participation in Patient Care: No (Comment)   Current home services: Home PT Criminal Activity/Legal Involvement Pertinent to Current Situation/Hospitalization: No - Comment as needed  Activities of Daily Living Home Assistive Devices/Equipment: Cane (specify quad or straight), Walker (specify type)(rollator; glasses) ADL Screening (condition at time of admission) Patient's cognitive ability adequate to safely complete daily activities?: Yes Is the patient deaf or have difficulty hearing?:  No Does the patient have difficulty seeing, even when wearing glasses/contacts?: No Does the patient have difficulty concentrating, remembering, or making decisions?: Yes Patient able to express need for assistance with ADLs?: Yes Does the patient have difficulty dressing or bathing?: No Independently performs ADLs?: Yes (appropriate for developmental age) Does the patient have difficulty walking or climbing stairs?: Yes Weakness of Legs: Both Weakness of Arms/Hands: None  Permission Sought/Granted                  Emotional Assessment       Orientation: : Oriented to Self, Oriented to Situation      Admission diagnosis:  Dyspnea, unspecified type [R06.00] Dyspnea [R06.00] Patient Active Problem List   Diagnosis Date Noted  . Dyspnea 07/02/2019  . Respiratory failure ([place]) 06/28/2019  . Chronic respiratory failure with hypoxia ([place])   . Community acquired pneumonia of right lower lobe of lung ([place])   . COPD with acute exacerbation ([place]) 06/16/2019  . COPD exacerbation ([place]) 05/09/2019  . Leukocytosis 05/09/2019  . Lumbar stenosis 05/09/2019  . Fall 04/01/2019  . Leg weakness, bilateral 04/01/2019  . Tobacco abuse 04/01/2019  . Alcohol abuse 04/01/2019  . Transient loss of consciousness 02/24/2019  . ESRD (end stage renal disease) on dialysis ([place]) 02/24/2019  . Seizure-like activity ([place]) 02/24/2019  . NSTEMI (non-ST elevated myocardial infarction) ([place]) 02/24/2019  . Ischemic  cardiomyopathy 02/24/2019  . Hypertensive emergency 09/30/2018  . Type II diabetes mellitus with renal manifestations ([place]) 09/30/2018  . HLD (hyperlipidemia) 09/30/2018  . Depression with anxiety 09/30/2018  . CKD (chronic kidney disease), stage V ([place]) 09/30/2018  . Acute on chronic respiratory failure with hypoxia ([place]) 09/30/2018  . Fluid overload 09/30/2018  . CAD (coronary artery disease) 09/30/2018  . Anemia of chronic disease 09/30/2018  . COPD (chronic obstructive pulmonary  disease) ([place])   . Acute on chronic combined systolic and diastolic CHF (congestive heart failure) ([place])   . Solitary right kidney 06/24/2018  . Chronic systolic CHF (congestive heart failure) ([place]) 06/24/2018  . Hypertensive emergency 06/07/2018  . CKD (chronic kidney disease) stage 5, GFR less than 15 ml/min ([name]) 06/07/2018  . Normocytic normochromic anemia 06/07/2018  . DM (diabetes mellitus), type 2 with renal complications ([place]) 43/15/4008  . Hypertensive urgency 06/07/2018  . Occlusion and stenosis of carotid artery without mention of cerebral infarction 01/19/2012  . RENAL ARTERY STENOSIS 09/10/2010  . ABDOMINAL BRUIT 08/12/2010  . DEPRESSION 08/11/2010  . PERIPHERAL VASCULAR DISEASE 08/11/2010  . Essential hypertension 08/08/2010  . Coronary atherosclerosis 08/08/2010  . ARTHRALGIA 08/08/2010   PCP:  Tsosie Billing, MD (Inactive) Pharmacy:   [place], [place] - [address], [address], [place] [address] Phone: [phone] Fax: [phone]  [facility] [id number] [place], [place] [address] [place] [address] Phone: [phone] Fax: [phone]  [facility] [place], [place] - [address] [place] [address] Phone: [phone] Fax: [phone]  [facility] - [place], [place] - [address] [place], [address] [place] [address] Phone: [phone] Fax: [phone]     Social Determinants of Health (SDOH) Interventions    Readmission Risk Interventions No flowsheet data found.

## 2019-07-04 NOTE — Progress Notes (Signed)
Patient BP 79/49. IMTS paged and instructed this RN to recheck the BP and temp in 1 hour.

## 2019-07-05 ENCOUNTER — Inpatient Hospital Stay (HOSPITAL_COMMUNITY): Payer: Medicare Other

## 2019-07-05 DIAGNOSIS — Z9981 Dependence on supplemental oxygen: Secondary | ICD-10-CM

## 2019-07-05 DIAGNOSIS — J9611 Chronic respiratory failure with hypoxia: Secondary | ICD-10-CM

## 2019-07-05 DIAGNOSIS — Z7951 Long term (current) use of inhaled steroids: Secondary | ICD-10-CM

## 2019-07-05 LAB — CBC
HCT: 32.4 % — ABNORMAL LOW (ref 36.0–46.0)
Hemoglobin: 10 g/dL — ABNORMAL LOW (ref 12.0–15.0)
MCH: 33.4 pg (ref 26.0–34.0)
MCHC: 30.9 g/dL (ref 30.0–36.0)
MCV: 108.4 fL — ABNORMAL HIGH (ref 80.0–100.0)
Platelets: 224 10*3/uL (ref 150–400)
RBC: 2.99 MIL/uL — ABNORMAL LOW (ref 3.87–5.11)
RDW: 18.3 % — ABNORMAL HIGH (ref 11.5–15.5)
WBC: 8.1 10*3/uL (ref 4.0–10.5)
nRBC: 0.5 % — ABNORMAL HIGH (ref 0.0–0.2)

## 2019-07-05 LAB — RENAL FUNCTION PANEL
Albumin: 3.4 g/dL — ABNORMAL LOW (ref 3.5–5.0)
Anion gap: 17 — ABNORMAL HIGH (ref 5–15)
BUN: 63 mg/dL — ABNORMAL HIGH (ref 8–23)
CO2: 20 mmol/L — ABNORMAL LOW (ref 22–32)
Calcium: 8.4 mg/dL — ABNORMAL LOW (ref 8.9–10.3)
Chloride: 99 mmol/L (ref 98–111)
Creatinine, Ser: 7.24 mg/dL — ABNORMAL HIGH (ref 0.44–1.00)
GFR calc Af Amer: 6 mL/min — ABNORMAL LOW (ref >60)
GFR calc non Af Amer: 5 mL/min — ABNORMAL LOW (ref >60)
Glucose, Bld: 90 mg/dL (ref 70–99)
Phosphorus: 7 mg/dL — ABNORMAL HIGH (ref 2.5–4.6)
Potassium: 4.2 mmol/L (ref 3.5–5.1)
Sodium: 136 mmol/L (ref 135–145)

## 2019-07-05 MED ORDER — IPRATROPIUM-ALBUTEROL 0.5-2.5 (3) MG/3ML IN SOLN
RESPIRATORY_TRACT | Status: AC
  Filled 2019-07-05: qty 3

## 2019-07-05 NOTE — Progress Notes (Addendum)
Renal Navigator met briefly with patient while she was finishing her HD treatment today. She states she did fine in the recliner. Renal Navigator provided praise and encouragement. Renal Navigator asked her where she will be for HD on Friday and she replied that she will be at her clinic. Renal Navigator again praised her and reiterated the importance of attending every OP HD appointment at [facility]. She stated understanding and agreement.  Renal Navigator stated to patient that if she cannot care for herself in the community independently, including attending OP HD appointments, then she will need to consider placement in a facility. Renal Navigator sent message to PT and Case Manager to discuss possibility of wheelchair for patient at home if SNF is refused.  Alphonzo Cruise, [place] Renal Navigator [phone]

## 2019-07-05 NOTE — Progress Notes (Signed)
Pt seen at the bedside this afternoon while she was eating lunch. Discussed discharge planning with the patient. Pt initially stated that she wanted to go home and would be able to care for herself because her "apartment is small, and I don't leave the couch much anyway." When asked about missed dialysis appointments and history of falls, pt stated her neighbors look out for her as "we are all a bunch of old farts." At this time, recommendations to go to SNF were reiterated to her. Expressed concern that she needs further assistance and care that she isn't able to get at home. Pt agreed that she wants to get better and being at a facility may prevent her from missing outpatient appointments and frequent hospital admissions. She accepted the offer to consult social worker for SNF placement. Pt expressed gratitude for her care in the hospital.   Consult for SNF placement ordered.

## 2019-07-05 NOTE — Discharge Summary (Addendum)
Name: Kari Robinson MRN: [id number] DOB: 01/19/1951 68 y.o. PCP: Tsosie Billing, MD (Inactive)  Date of Admission: 07/02/2019 12:09 PM Date of Discharge: 07/12/19 Attending Physician: Oda Kilts, MD  Discharge Diagnosis: 1. ESRD on HD 2. COPD 3. CAD with STEMI and stent placement x2  Discharge Medications: Allergies as of 07/11/2019      Reactions   Citalopram Hydrobromide Hives   Morphine And Related Itching      Medication List    STOP taking these medications   isosorbide mononitrate 30 MG 24 hr tablet Commonly known as: IMDUR   predniSONE 20 MG tablet Commonly known as: DELTASONE     TAKE these medications   albuterol 108 (90 Base) MCG/ACT inhaler Commonly known as: VENTOLIN HFA Inhale 2 puffs into the lungs every 4 (four) hours as needed for wheezing or shortness of breath.   aspirin EC 81 MG tablet Take 81 mg by mouth daily.   atorvastatin 40 MG tablet Commonly known as: LIPITOR Take 40 mg by mouth daily.   Auryxia 1 GM 210 MG(Fe) tablet Generic drug: ferric citrate Take 210-420 mg by mouth See admin instructions. Take 2 tablets (420mg ) by mouth three times daily with meals, and 1 tablet (210mg ) by mouth with snacks   b complex-vitamin c-folic acid 0.8 MG Tabs tablet Take 1 tablet by mouth See admin instructions. Take one tablet by mouth on Sunday, Tuesday, Thursday, Saturday mornings. Take one tablet after dialysis on Monday, Wednesday, Friday   budesonide-formoterol 160-4.5 MCG/ACT inhaler Commonly known as: SYMBICORT Inhale 2 puffs into the lungs 2 (two) times daily.   buPROPion 150 MG 12 hr tablet Commonly known as: WELLBUTRIN SR Take 150 mg by mouth 2 (two) times daily.   cinacalcet 30 MG tablet Commonly known as: SENSIPAR Take 1 tablet (30 mg total) by mouth every Monday, Wednesday, and Friday at 6 PM.   clopidogrel 75 MG tablet Commonly known as: PLAVIX Take 75 mg by mouth daily.   feeding supplement (PRO-STAT SUGAR  FREE 64) Liqd Take 30 mLs by mouth 2 (two) times daily between meals.   ipratropium-albuterol 0.5-2.5 (3) MG/3ML Soln Commonly known as: DUONEB Take 3 mLs by nebulization every 6 (six) hours as needed. What changed: reasons to take this   lidocaine-prilocaine cream Commonly known as: EMLA Apply 1 application topically every Monday, Wednesday, and Friday. Prior to dialysis   metoprolol succinate 25 MG 24 hr tablet Commonly known as: TOPROL-XL Take 1 tablet (25 mg total) by mouth daily. Take with or immediately following a meal. What changed:   medication strength  how much to take   OXYGEN Inhale 3 L/min into the lungs continuous.   pantoprazole 20 MG tablet Commonly known as: PROTONIX Take 1 tablet (20 mg total) by mouth daily.   SEROquel 200 MG tablet Generic drug: QUEtiapine Take 200 mg by mouth at bedtime.   Spiriva HandiHaler 18 MCG inhalation capsule Generic drug: tiotropium Place 1 capsule (18 mcg total) into inhaler and inhale daily.       Disposition and follow-up:   Kari Robinson was discharged from [facility] in Stable condition.  At the hospital follow up visit please address:  1.  Problem List ESRD on HD - Missed multiple outpatient dialysis sessions. Due to tenuous cardiac/pulmonary status, she is sensitive to even slight volume excess. Dyspnea improved after taking off volume with inpatient dialysis. Discharged to SNF with hopes of increasing compliance at outpatient dialysis.  COPD - Dyspnea  thought to be related to volume overload. Pt cough and sputum production remained unchanged throughout hospitalization. Continued on home inhalers and did well on home O2 requirement of 2-3L [place]. Needs to reschedule outpatient pulmonology appointment that she missed earlier this month.  HTN CAD with STEMI and stent placement x2 - Hypertensive on admission, thought to be due to volume excess and anxiety. Developed hypotension with dialysis. When  beta blocker was held, she developed frequent 5-7 beats of V tach between 2-4 beats of sinus rhthym. Discharged on a lower dose of metoprolol, 25mg  daily. Needs to reschedule outpatient cardiology appointment, for [facility], that she missed earlier this month.  Lumbar Stenosis - Evaluated by Dr. Lelon Perla during prior hospitalization on 5/17, and requests follow-up with neurosurgery for potential surgical intervention. Did not have any red flag symptoms throughout hospitalization so workup was deferred for outpatient evaluation.  2.  Labs / imaging needed at time of follow-up: NONE  3.  Pending labs/ test needing follow-up: NONE  Follow-up Appointments: Follow-up Information    Icard, Bradley L, DO. Call in 2 day(s).   Specialty: Pulmonary Disease Why: Call to reschedule missed appointment. Appointment: July 11, 2019 @ 3:15pm Contact information: [place] [address] [phone]        Kari Robinson, [place]. Call in 2 day(s).   Specialties: Cardiology, Radiology Why: Call to reschedule missed appointment. Appointment: July 10, 2019 @ 9:30am "Telephone Visit"  Contact information: [address] [place] [address] [phone]        Tsosie Billing, MD. Call in 1 day(s).   Specialty: Internal Medicine Why: Schedule appointment for hospital follow-up. Appointment: July 12, 2019 @ 2:20pm Call if you need transportation  Contact information: [place] [address] [phone]        Kari Gamma, MD. Call in 2 day(s).   Specialty: Neurosurgery Why: Schedule appointment to follow-up on lumbar stenosis. The offiice will call you with appointment. Contact information: [address] [place] [address] [phone]           Hospital Course by problem list: 1. ESRD on HD 2. HTN 3. COPD 4. CAD with STEMI and stent placement x2  Pt is a 68 year old F with history of COPD  with multiple prior hospitalizations for exacerbations(most recently 2 days ago), 200 pack year smoking history, L lower lung lobectomy due to cancer in 2017, ESRD on HD (MWF), HTN, HLD, STEMI w/ stent placement x2, Afib w/ RVR, HFrEF (EF 40% on 02/25/19) who presented for ongoing shortness of breath. Volume overload due to missed dialysis sessions appeared to be driving her dyspnea on admission, but underlying COPD and anxiety also felt to be contributing. Nephrology consulted and felt her hemodynamic instability on admission (significant hypertension and tachycardia) were the result of her being sensitive to volume excess in light of her poor cardiac/pulmonary status. Pt's dyspnea improved after inpatient dialysis on 8/16 and 8/17. Pt had hypotension at dialysis on 0/93 with systolic BPs in the 81W which was limiting fluid output. Home metoprolol 50mg  and Imdur 30mg  qhs were held. On 8/18, pt complained of sudden onset chest pain that she felt was like her previous angina pain. Not exertional and without SOB or diaphoresis. Repeat EKG at that time showed normal sinus rhythm and LVH without ST segment changes. On 8/19, pt became hypertensive with slight tachycardia again, so the plan was to take a decreased 25mg  dose on  non-dialysis days. On 8/24, pt developed frequent runs of V tach after dialysis. Pt was entirely asymptomatic at this time without chest pain, dizziness, or dyspnea. Electrolytes were normal with Mg 2.0, K 4.2, and Phos 3.4. Review of telemetry alarms showed 5-7 beats of v tach followed by 2-4 sinus beats. High burden of v tach thought to be secondary to beta blocker withdrawal and rebound tachycardia. HR and rhythm improved with 25mg  metoprolol tartrate. Additionally, pt was noted to have continued chronic back pain and tremulous lower extremities throughout her hospitalization. This was investigated in May 2020, with MRI of C-spine showing degenerative disc disease and L-spine with severe spinal  stenosis L4-5 with central disc protrusion, severe subarticular stenosis bilaterally, and moderate subarticular stenosis bilaterally L5-S1 due to spurring. Neurosurgery recommended surgical intervention, but pt declined at that time. It was recommended that she could follow-up with them outpatient for future elective surgery. Physical therapy noted pt's mobility to be limited by weak and tremulous extremities and recommended SNF placement. Pt was agreeable to SNF placement. At the time of discharge, pt was doing well with HR in the 70s, BP 130s/80s, RR 20-22, and O2 saturation 94 on room air.   Discharge Vitals:   BP 127/80    Pulse 66    Temp 98.8 F (37.1 C) (Oral)    Resp (!) 30    Ht 5\' 4"  (1.626 m)    Wt 71.3 kg    SpO2 95%    BMI 26.98 kg/m   Pertinent Labs, Studies, and Procedures:  CBC Latest Ref Rng & Units 07/10/2019 07/07/2019 07/05/2019  WBC 4.0 - 10.5 K/uL 8.2 7.8 8.1  Hemoglobin 12.0 - 15.0 g/dL 9.6(L) 9.5(L) 10.0(L)  Hematocrit 36.0 - 46.0 % 29.4(L) 29.9(L) 32.4(L)  Platelets 150 - 400 K/uL 156 177 224   BMP Latest Ref Rng & Units 07/11/2019 07/10/2019 07/10/2019  Glucose 70 - 99 mg/dL 93 129(H) 108(H)  BUN 8 - 23 mg/dL 52(H) 37(H) 106(H)  Creatinine 0.44 - 1.00 mg/dL 5.17(H) 4.07(H) 7.98(H)  Sodium 135 - 145 mmol/L 131(L) 132(L) 137  Potassium 3.5 - 5.1 mmol/L 4.3 4.2 4.8  Chloride 98 - 111 mmol/L 91(L) 93(L) 99  CO2 22 - 32 mmol/L 24 25 21(L)  Calcium 8.9 - 10.3 mg/dL 8.7(L) 8.5(L) 9.1    EKG Interpretation  Date/Time:  Sunday July 02 2019 14:43:04 EDT Ventricular Rate:  118 PR Interval:    QRS Duration: 103 QT Interval:  364 QTC Calculation: 475 R Axis:   44 Text Interpretation:  Sinus tachycardia Paired ventricular premature complexes Probable left atrial enlargement RSR' in V1 or V2, probably normal variant LVH with secondary repolarization abnormality ST depression, consider ischemia, diffuse lds Confirmed by Lacretia Leigh ([id number]) on 07/02/2019 2:48:09 PM       CXR 07/05/2019 CLINICAL DATA:  Shortness of breath and chest pain  EXAM: CHEST - 2 VIEW  COMPARISON:  07/02/2019  FINDINGS: Cardiac shadow is stable. Aortic calcifications are again seen. The lungs are well aerated bilaterally. No focal infiltrate or sizable effusion is seen. No bony abnormality is noted.  IMPRESSION: No acute abnormality seen.   CXR 07/02/2019 CLINICAL DATA:  Shortness of breath since this morning. Chest tightness. History of lung cancer. Ex-smoker.  EXAM: PORTABLE CHEST 1 VIEW  COMPARISON:  06/28/2019.  FINDINGS: Stable enlarged cardiac silhouette and tortuous and calcified thoracic aorta. Stable surgical clips and staples on the left. Stable coronary artery stent. Stable mild prominence of the interstitial markings with normal vascularity. Stable  mild elevation of the left hemidiaphragm and small amount of pleural scarring at the left lung base laterally. Interval minimal linear atelectasis at the right lung base. Diffuse osteopenia and mild scoliosis. Atheromatous arterial calcifications.  IMPRESSION: 1. Interval minimal linear atelectasis at the right lung base. 2. Stable cardiomegaly and mild chronic interstitial lung disease.   Discharge Instructions: Discharge Instructions    Diet - low sodium heart healthy   Complete by: As directed    Discharge instructions   Complete by: As directed    Ms. Mally was seen for shortness of breath after missing multiple outpatient dialysis sessions. Pt is sensitive to volume changes due to her underlying COPD and heart disease. It is important that she makes outpatient dialysis every Monday, Wednesday, and Friday.   She needs to call and reschedule outpatient follow-up with pulmonology for her COPD and cardiology for her CAD as she missed appointments during her hospitalization. Can schedule an appointment with neurosurgery for her chronic back pain and potential elective surgery.   Increase  activity slowly   Complete by: As directed       Signed: Ladona Horns, MD 07/11/2019, 2:52 PM   Pager: [phone]      Internal Medicine Attending Note:  I saw and examined the patient on the day of discharge. I reviewed and agree with the discharge summary written by the house staff.  Lenice Pressman, M.D., Ph.D.

## 2019-07-05 NOTE — Progress Notes (Signed)
PT Note  Patient suffers from significant weakness and poor activity tolerance which impairs their ability to perform daily activities like being able to get to Hemodialysis.  A walker alone will not resolve the issues with performing activities of daily living. A wheelchair will allow patient to safely perform daily activities.  The patient can self propel in the home or has a caregiver who can provide assistance. Currently pt agreeable to SNF but if this decision changes will need a w/c prior to return home.   [place] Pager [phone] Office [phone]

## 2019-07-05 NOTE — Progress Notes (Addendum)
  Date: 07/05/2019  Patient name: Beola Vasallo  Medical record number: [id number]  Date of birth: 04-16-1951   I have seen and evaluated this patient and I have discussed the plan of care with the house staff. Please see their note for complete details. I concur with their findings with the following additions/corrections:   Seen together at dialysis today. Was resting comfortably in dialysis chair, though complained of discomfort with the chair once she woke up. Chest pain has resolved, breathing is better. Nephrology planning repeat CXR today.  Primary problem will be maintaining outpatient dialysis, appreciate assistance from renal navigator. PT recommending SNF, will see if she is agreeable, but would significantly benefit her functional status and keep her consistent with dialysis, hopefully giving her a chance to improve.   Lenice Pressman, M.D., Ph.D. 07/05/2019, 2:26 PM   Addendum: Clarification for documentation purposes: She uses oxygen at home for chronic hypoxic respiratory failure that is likely multifactorial due to her COPD, CHF, and ESRD.

## 2019-07-05 NOTE — Progress Notes (Signed)
Subjective: Pt seen at dialysis this morning. Initially sleeping in the chair. States her breathing is unchanged. Pt asking about neurosurgery referral for a heirinated disc in her back. When asked if she remembers discussing this with the renal navigator and physician yesterday afternoon, she says she doesn't remember that part of the conversation.  Objective:  Vital signs in last 24 hours: Vitals:   07/04/19 1927 07/04/19 1943 07/04/19 2316 07/05/19 0301  BP:   123/65 (!) 127/100  Pulse:      Resp:      Temp: 97.8 F (36.6 C)  98.4 F (36.9 C) 97.8 F (36.6 C)  TempSrc: Oral  Oral Oral  SpO2:  99%    Weight:      Height:      Physical Exam Vitals signs and nursing note reviewed.  Constitutional:      General: She is not in acute distress.    Appearance: She is ill-appearing (chronically).     Comments: Initially sleeping in the chair at dialysis. Awoken, alert, and conversing with the team.   Cardiovascular:     Rate and Rhythm: Normal rate and regular rhythm.     Heart sounds: Normal heart sounds. No murmur. No friction rub. No gallop.   Pulmonary:     Effort: Pulmonary effort is normal. No tachypnea.     Comments: On [place]. Scattered wheezes, predominantly in the bases. Abdominal:     General: Bowel sounds are normal.     Palpations: Abdomen is soft.     Tenderness: There is no abdominal tenderness.  Musculoskeletal:     Right lower leg: No edema.     Left lower leg: No edema.  Skin:    General: Skin is warm and dry.  Neurological:     Mental Status: She is alert.    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 Volume overload due to missed dialysis sessions appears to be  driving her dyspnea this admission, but underlying COPD and anxiety also contributing.    ESRD on HD (MWF)- Pt continuing to miss outpatient dialysis for a variety of reasons Nephrology following, appreciate recommendations. - encouraged consideration of moving in with family/SNF to improve dialysis compliance              - sensitive to slight volume excess due to her tenuous cardiac/pulmonary status             - hemodialysis again today  - aranesp as needed - labs today; K 4.2, BUN 63, Cr 7.24, Phos 7.0, Ca 8.4, Hgb 10.0 - holding metoprolol and imdur due to low pressures - PT eval recommending SNF placement   HTN - resolved with dialysis, now with occasional hypotension Elevated pressures on admission secondary to anxiety and volume overload - holding home metoprolol 50mg  PO daily for dialysis session  - would benefit from beta-blockade in the future if pressures will tolerate it   CAD with STEMI w/ stent placement x2 Pt having chest pain yesterday. EKG reassuring; normal rate, sinus rhythm, LVH. Will trial pantoprazole 20mg  daily. - ASA 81mg and plavix 75mg daily  - holding isosobide mononitrate 30mg  PO daily qhs in light of low pressures   COPD -  pt's cough and sputum production unchanged, dyspnea does not appear to be due to exacerbation. No need for steroid burst. - Breo inhaler 1 puff daily - Incruse inhaler 1 puff daily -duonebs (ipratropium-albuterol) q6hr - received solumedrol 125mg  IV once in ED  Insomnia - continue home seroquel 200mg  qhs   Diet - Renal Diet1260mL fluid restriction DVT ppx- heparin 5,000 units subq q8h Fluids - none CODE STATUS- FULL CODE   Dispo: pending further need for dialysis while inpatient and potential SNF placement.  Will need to reschedule outpatient pulmonology and cardiology appointments that have been missed this month. Would also recommend neurosurgery follow-up in the outpatient setting, as her back pain is chronic  in nature and she has no red-flag symptoms at this time. Was addressed in a previous hospitalization in May 2020 by Dr. Lelon Perla, and she was found to have lumbar stenosis without a need for immediate intervention.    Ladona Horns, MD 07/05/2019, 7:00 AM Pager: [phone]

## 2019-07-05 NOTE — Progress Notes (Signed)
[facility] Progress Note  Subjective: BP's in 80's on HD today.  No more SOB.   Vitals:   07/05/19 1100 07/05/19 1111 07/05/19 1114 07/05/19 1200  BP: (!) 126/58  136/61 (!) 136/95  Pulse: 68 79 79 83  Resp:  15 16 19   Temp:   98.2 F (36.8 C)   TempSrc:   Oral   SpO2:  100% 100% 98%  Weight:   70 kg   Height:        Inpatient medications: . aspirin EC  81 mg Oral Daily  . buPROPion  150 mg Oral BID WC  . Chlorhexidine Gluconate Cloth  6 each Topical Q0600  . Chlorhexidine Gluconate Cloth  6 each Topical Q0600  . cinacalcet  30 mg Oral Q M,W,F-1800  . clopidogrel  75 mg Oral Daily  . feeding supplement (NEPRO CARB STEADY)  237 mL Oral Q24H  . fluticasone furoate-vilanterol  1 puff Inhalation Daily  . heparin  5,000 Units Subcutaneous Q8H  . ipratropium-albuterol  3 mL Nebulization QID  . multivitamin  1 tablet Oral QHS  . pantoprazole  20 mg Oral Daily  . QUEtiapine  200 mg Oral QHS  . umeclidinium bromide  1 puff Inhalation Daily   . sodium chloride         Exam:  alert,  No distress  +jvd  Chest L rales, R cta  Cor reg no RG  Abd soft ntnd  Ext 1+ edema  RUE RCF+bruit   CXR 8/16 1pm - IMPRESSION: Interval minimal linear atelectasis at the right lung base. 2. Stable cardiomegaly and mild chronic interstitial lung disease.   Dialysis: [facility] M[facility]   3.5h  350/1.5  68.5kg  2/2  Hep none  RUE RCF  aranesp 60 ug /wk  auryxia 210 tid ac   Assessment/ Plan: 1. Dyspnea/ vol overload - CXR borderline. Has underlying combined CHF and COPD so likely is more sensitive to vol overload from missed HD. With HD x 2 is down to dry wt and breathing better. Wt's up however not sure these are accurate. No UF on HD today due to low BP's in 80's. Will get f/u CXR. Is at baseline [place] O2, may be ready for dc today.  2. ESRD - on HD MWF. Missing HD. PT rec's SNF, patient declines.  3. COPD - on home O2 at 3 L [place] 4. HTN - home metoprolol and Imdur on hold for low  BP's 5. Anemia ckd - esa as needed 6. MBD ckd - cont meds 7. H/o STEMI w/ stent placement x 2     [facility] 07/05/2019, 12:55 PM  Iron/TIBC/Ferritin/ %Sat    Component Value Date/Time   IRON 70 06/17/2019 1128   TIBC 265 06/17/2019 1128   FERRITIN 1,208 (H) 06/17/2019 1128   IRONPCTSAT 26 06/17/2019 1128   Recent Labs  Lab 07/05/19 0816  NA 136  K 4.2  CL 99  CO2 20*  GLUCOSE 90  BUN 63*  CREATININE 7.24*  CALCIUM 8.4*  PHOS 7.0*  ALBUMIN 3.4*   No results for input(s): AST, ALT, ALKPHOS, BILITOT, PROT in the last 168 hours. Recent Labs  Lab 07/05/19 0816  WBC 8.1  HGB 10.0*  HCT 32.4*  PLT 224

## 2019-07-06 ENCOUNTER — Other Ambulatory Visit: Payer: Self-pay | Admitting: Internal Medicine

## 2019-07-06 LAB — RENAL FUNCTION PANEL
Albumin: 3.2 g/dL — ABNORMAL LOW (ref 3.5–5.0)
Anion gap: 12 (ref 5–15)
BUN: 38 mg/dL — ABNORMAL HIGH (ref 8–23)
CO2: 26 mmol/L (ref 22–32)
Calcium: 8.3 mg/dL — ABNORMAL LOW (ref 8.9–10.3)
Chloride: 100 mmol/L (ref 98–111)
Creatinine, Ser: 5 mg/dL — ABNORMAL HIGH (ref 0.44–1.00)
GFR calc Af Amer: 10 mL/min — ABNORMAL LOW (ref >60)
GFR calc non Af Amer: 8 mL/min — ABNORMAL LOW (ref >60)
Glucose, Bld: 99 mg/dL (ref 70–99)
Phosphorus: 5.2 mg/dL — ABNORMAL HIGH (ref 2.5–4.6)
Potassium: 3.7 mmol/L (ref 3.5–5.1)
Sodium: 138 mmol/L (ref 135–145)

## 2019-07-06 MED ORDER — PRO-STAT SUGAR FREE PO LIQD
30.0000 mL | Freq: Three times a day (TID) | ORAL | Status: DC
  Administered 2019-07-06 – 2019-07-10 (×10): 30 mL via ORAL
  Filled 2019-07-06 (×11): qty 30

## 2019-07-06 MED ORDER — METOPROLOL SUCCINATE ER 50 MG PO TB24
50.0000 mg | ORAL_TABLET | Freq: Every day | ORAL | Status: DC
  Administered 2019-07-06: 50 mg via ORAL
  Filled 2019-07-06: qty 1

## 2019-07-06 MED ORDER — FERRIC CITRATE 1 GM 210 MG(FE) PO TABS
210.0000 mg | ORAL_TABLET | Freq: Three times a day (TID) | ORAL | Status: DC
  Administered 2019-07-06 – 2019-07-12 (×16): 210 mg via ORAL
  Filled 2019-07-06 (×20): qty 1

## 2019-07-06 MED ORDER — METOPROLOL SUCCINATE ER 25 MG PO TB24: 25.0000 mg | ORAL_TABLET | Freq: Every day | ORAL | 0 refills | Status: DC

## 2019-07-06 MED ORDER — BUPROPION HCL ER (SR) 150 MG PO TB12
150.0000 mg | ORAL_TABLET | Freq: Two times a day (BID) | ORAL | Status: DC
  Administered 2019-07-06 – 2019-07-12 (×12): 150 mg via ORAL
  Filled 2019-07-06 (×14): qty 1

## 2019-07-06 MED ORDER — CHLORHEXIDINE GLUCONATE CLOTH 2 % EX PADS
6.0000 | MEDICATED_PAD | Freq: Every day | CUTANEOUS | Status: DC
  Administered 2019-07-06 – 2019-07-09 (×3): 6 via TOPICAL

## 2019-07-06 MED ORDER — CHLORHEXIDINE GLUCONATE CLOTH 2 % EX PADS: 6.0000 | MEDICATED_PAD | Freq: Every day | CUTANEOUS | Status: DC

## 2019-07-06 MED ORDER — PANTOPRAZOLE SODIUM 20 MG PO TBEC: 20.0000 mg | DELAYED_RELEASE_TABLET | Freq: Every day | ORAL | 0 refills | Status: DC

## 2019-07-06 NOTE — Care Management Important Message (Signed)
Important Message  Patient Details  Name: Kari Robinson MRN: [id number] Date of Birth: September 14, 1951   Medicare Important Message Given:  Yes     [name] 07/06/2019, 2:35 PM

## 2019-07-06 NOTE — Progress Notes (Signed)
  Date: 07/06/2019  Patient name: Kari Robinson  Medical record number: [id number]  Date of birth: 1951-05-01   I have seen and evaluated this patient and I have discussed the plan of care with the house staff. Please see their note for complete details. I concur with their findings with the following additions/corrections:   Additional episodes of chest pain, EKGs with no ischemic changes. Rhonchi and wheezing on exam this morning just after breathing treatments. Repeat CXR without significant volume overload, ok for discharge to SNF. Would probably continue to hold metoprolol and imdur with hypotension during dialysis, perhaps could take on non-dialysis days, but defer to nephrology.  Lenice Pressman, M.D., Ph.D. 07/06/2019, 2:07 PM

## 2019-07-06 NOTE — Plan of Care (Signed)

## 2019-07-06 NOTE — Progress Notes (Signed)
Subjective: Pt seen at the bedside this morning. Has no acute concerns. Still interested in SNF placement.  Objective:  Vital signs in last 24 hours: Vitals:   07/05/19 2341 07/06/19 0120 07/06/19 0415 07/06/19 0500  BP: (!) 146/89 (!) 150/96 (!) 168/91 (!) 166/86  Pulse: (!) 46 82  92  Resp: (!) 23 (!) 25 18 19   Temp: 98.9 F (37.2 C)   98.3 F (36.8 C)  TempSrc: Oral   Oral  SpO2: 100% 94% 94% 91%  Weight:      Height:      Physical Exam Constitutional:      Appearance: She is ill-appearing (chronically).  Cardiovascular:     Rate and Rhythm: Normal rate and regular rhythm.     Heart sounds: Normal heart sounds. No murmur. No friction rub. No gallop.   Pulmonary:     Effort: Pulmonary effort is normal. No tachypnea or respiratory distress.     Breath sounds: Decreased air movement present.     Comments: On home 3L [place]. Scattered wheezes and rhonchi. Abdominal:     General: Bowel sounds are normal.     Palpations: Abdomen is soft.  Musculoskeletal:     Right lower leg: No edema.     Left lower leg: No edema.  Neurological:     Mental Status: She is alert.       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 Volume overload due to missed dialysis sessions appears to be driving her dyspnea this admission, but underlying COPD and anxiety also contributing.    ESRD on HD (MWF)- Pt continuing to miss outpatient dialysis for a variety of reasons Nephrology following, appreciate recommendations. - encouraged consideration of moving in with family/SNF to improve dialysis compliance              - sensitive to slight volume excess due to her tenuous cardiac/pulmonary status       - back to MWF hemodialysis schedule  - aranesp as needed - CXR yesterday - no acute abnormality - labs today; K 3.7, BUN 38, Cr 5.00, Phos 5.2, Ca 8.3 - PT eval - recommending SNF placement   HTN - elevated BP's this morning Elevated pressures on admission secondary to anxiety and volume overload - restarting home metoprolol 50mg  PO daily - would benefit from beta-blockade in the future if pressures will tolerate it   CAD with STEMI w/ stent placement x2 EKG's yesterday reassuring; normal rate, sinus rhythm, LVH.  - ASA 81mg and plavix 75mg daily  - holding isosobide mononitrate 30mg  PO daily qhs   COPD - pt's cough and sputum production unchanged, dyspnea does not appear to be due to exacerbation. No need for steroid burst. - Breo inhaler 1 puff daily - Incruse inhaler 1 puff daily -duonebs (ipratropium-albuterol) q6hr - received solumedrol 125mg  IV once in ED  Insomnia - continue home seroquel 200mg  qhs   Diet - Renal Diet1211mL fluid restriction DVT ppx- heparin 5,000 units subq q8h Fluids - none CODE STATUS- FULL CODE   Dispo: pending SNF placement.  Will need to reschedule outpatient pulmonology and cardiology appointments that have been missed this month. Would also recommend neurosurgery follow-up in the outpatient  setting for chronic back pain and lumbar stenosis.    Ladona Horns, MD 07/06/2019, 6:29 AM Pager: [phone]

## 2019-07-06 NOTE — Progress Notes (Signed)
[facility] Progress Note  Subjective: f/u CXR clear.  Vitals:   07/06/19 0713 07/06/19 0725 07/06/19 0730 07/06/19 0755  BP: (!) 167/108   135/81  Pulse: 93     Resp: 19     Temp:    97.7 F (36.5 C)  TempSrc:    Oral  SpO2: 96% 99% 96%   Weight:      Height:        Inpatient medications: . aspirin EC  81 mg Oral Daily  . buPROPion  150 mg Oral BID WC  . Chlorhexidine Gluconate Cloth  6 each Topical Daily  . cinacalcet  30 mg Oral Q M,W,F-1800  . clopidogrel  75 mg Oral Daily  . feeding supplement (NEPRO CARB STEADY)  237 mL Oral Q24H  . feeding supplement (PRO-STAT SUGAR FREE 64)  30 mL Oral TID BM  . ferric citrate  210 mg Oral TID WC  . fluticasone furoate-vilanterol  1 puff Inhalation Daily  . heparin  5,000 Units Subcutaneous Q8H  . ipratropium-albuterol  3 mL Nebulization QID  . metoprolol succinate  50 mg Oral Daily  . multivitamin  1 tablet Oral QHS  . pantoprazole  20 mg Oral Daily  . QUEtiapine  200 mg Oral QHS  . umeclidinium bromide  1 puff Inhalation Daily   . sodium chloride         Exam:  alert,  No distress, coughing a lot, smoker's cough  +jvd  Chest L rales, R cta  Cor reg no RG  Abd soft ntnd  Ext 1+ edema  RUE RCF+bruit   CXR 8/16 1pm - IMPRESSION: Interval minimal linear atelectasis at the right lung base. 2. Stable cardiomegaly and mild chronic interstitial lung disease  CXR 8/19 - no active disease   Dialysis: [facility] MWF   3.5h  350/1.5  68.5kg  2/2  Hep none  RUE RCF  aranesp 60 ug /wk  auryxia 210 tid ac   Assessment/ Plan: 1. Dyspnea/ vol overload - CXR borderline. Has underlying combined CHF and COPD so likely is more sensitive to vol overload from missed HD. SP HD / UF and f/u CXR clear. Is on O2 at usual home dose.  2. ESRD - on HD MWF. Missed OP HD.   3. COPD - on home O2 at 3 L [place] 4. HTN - home metoprolol and Imdur on hold for low BP's 5. Anemia ckd - esa as needed 6. MBD ckd - cont meds 7. H/o STEMI w/  stent placement x 2 8. Debilty/ bilat LE weakness - seen in May by NSurg who offered L4-5 surgery, she declined at the time but is interested now. Will require preop clearance, etc, would recommend she f/u w/ them in OP setting for this, have d/w primary team.      [facility] 07/06/2019, 10:16 AM  Iron/TIBC/Ferritin/ %Sat    Component Value Date/Time   IRON 70 06/17/2019 1128   TIBC 265 06/17/2019 1128   FERRITIN 1,208 (H) 06/17/2019 1128   IRONPCTSAT 26 06/17/2019 1128   Recent Labs  Lab 07/06/19 0438  NA 138  K 3.7  CL 100  CO2 26  GLUCOSE 99  BUN 38*  CREATININE 5.00*  CALCIUM 8.3*  PHOS 5.2*  ALBUMIN 3.2*   No results for input(s): AST, ALT, ALKPHOS, BILITOT, PROT in the last 168 hours. Recent Labs  Lab 07/05/19 0816  WBC 8.1  HGB 10.0*  HCT 32.4*  PLT 224

## 2019-07-06 NOTE — NC FL2 (Signed)
[place] LEVEL OF CARE SCREENING TOOL     IDENTIFICATION  Patient Name: Kari Robinson Birthdate: 17-Apr-1951 Sex: female Admission Date (Current Location): 07/02/2019  [facility] and [place] Number:  Herbalist and Address:  [facility], [place] [address] [place], [place] [address]      Provider Number: [id number]  Attending Physician Name and Address:  Oda Kilts, MD  Relative Name and Phone Number:       Current Level of Care: Hospital Recommended Level of Care: [place] Prior Approval Number:    Date Approved/Denied:   PASRR Number: Manual review  Discharge Plan: SNF    Current Diagnoses: Patient Active Problem List   Diagnosis Date Noted  . Dyspnea 07/02/2019  . Respiratory failure ([place]) 06/28/2019  . Chronic respiratory failure with hypoxia ([place])   . Community acquired pneumonia of right lower lobe of lung ([place])   . COPD with acute exacerbation ([place]) 06/16/2019  . COPD exacerbation ([place]) 05/09/2019  . Leukocytosis 05/09/2019  . Lumbar stenosis 05/09/2019  . Fall 04/01/2019  . Leg weakness, bilateral 04/01/2019  . Tobacco abuse 04/01/2019  . Alcohol abuse 04/01/2019  . Transient loss of consciousness 02/24/2019  . ESRD (end stage renal disease) on dialysis ([place]) 02/24/2019  . Seizure-like activity ([place]) 02/24/2019  . NSTEMI (non-ST elevated myocardial infarction) ([place]) 02/24/2019  . Ischemic cardiomyopathy 02/24/2019  . Hypertensive emergency 09/30/2018  . Type II diabetes mellitus with renal manifestations ([place]) 09/30/2018  . HLD (hyperlipidemia) 09/30/2018  . Depression with anxiety 09/30/2018  . CKD (chronic kidney disease), stage V ([place]) 09/30/2018  . Acute on chronic respiratory failure with hypoxia ([place]) 09/30/2018  . Fluid overload 09/30/2018  . CAD (coronary artery disease) 09/30/2018  . Anemia of chronic disease 09/30/2018  . COPD (chronic obstructive pulmonary disease) ([place])    . Acute on chronic combined systolic and diastolic CHF (congestive heart failure) ([place])   . Solitary right kidney 06/24/2018  . Chronic systolic CHF (congestive heart failure) ([place]) 06/24/2018  . Hypertensive emergency 06/07/2018  . CKD (chronic kidney disease) stage 5, GFR less than 15 ml/min ([name]) 06/07/2018  . Normocytic normochromic anemia 06/07/2018  . DM (diabetes mellitus), type 2 with renal complications ([place]) 38/08/1750  . Hypertensive urgency 06/07/2018  . Occlusion and stenosis of carotid artery without mention of cerebral infarction 01/19/2012  . RENAL ARTERY STENOSIS 09/10/2010  . ABDOMINAL BRUIT 08/12/2010  . DEPRESSION 08/11/2010  . PERIPHERAL VASCULAR DISEASE 08/11/2010  . Essential hypertension 08/08/2010  . Coronary atherosclerosis 08/08/2010  . ARTHRALGIA 08/08/2010    Orientation RESPIRATION BLADDER Height & Weight     Self, Time, Situation, Place  O2([place] 3L) Continent Weight: 154 lb 4.8 oz (70 kg) Height:  5\' 4"  (162.6 cm)  BEHAVIORAL SYMPTOMS/MOOD NEUROLOGICAL BOWEL NUTRITION STATUS      Continent Diet(see DC summary)  AMBULATORY STATUS COMMUNICATION OF NEEDS Skin   Extensive Assist Verbally Normal                       Personal Care Assistance Level of Assistance  Bathing, Feeding, Dressing Bathing Assistance: Limited assistance Feeding assistance: Independent Dressing Assistance: Limited assistance     Functional Limitations Info  Sight, Hearing, Speech Sight Info: Adequate Hearing Info: Adequate Speech Info: Adequate    SPECIAL CARE FACTORS FREQUENCY  PT (By licensed PT), OT (By licensed OT)     PT Frequency: 5x/wk OT Frequency: 5x/wk  Contractures Contractures Info: Not present    Additional Factors Info  Code Status, Allergies, Psychotropic Code Status Info: Full Allergies Info: Citalopram Hydrobromide, Morphine And Related Psychotropic Info: Wellbutrin SR 150mg  2x/day; Seroquel 200mg  daily at bed          Current Medications (07/06/2019):  This is the current hospital active medication list Current Facility-Administered Medications  Medication Dose Route Frequency Provider Last Rate Last Dose  . 0.9 %  sodium chloride infusion   Intravenous Continuous Lacretia Leigh, MD      . albuterol (PROVENTIL) (2.5 MG/3ML) 0.083% nebulizer solution 2.5 mg  2.5 mg Nebulization Q4H PRN Oda Kilts, MD      . aspirin EC tablet 81 mg  81 mg Oral Daily Katherine Roan, MD   81 mg at 07/06/19 2778  . buPROPion [facility] SR) 12 hr tablet 150 mg  150 mg Oral BID WC Oda Kilts, MD   150 mg at 07/06/19 2423  . Chlorhexidine Gluconate Cloth 2 % PADS 6 each  6 each Topical Daily Oda Kilts, MD   6 each at 07/06/19 [phone]  . cinacalcet (SENSIPAR) tablet 30 mg  30 mg Oral Q M,W,F-1800 Katherine Roan, MD   30 mg at 07/05/19 1606  . clopidogrel (PLAVIX) tablet 75 mg  75 mg Oral Daily Katherine Roan, MD   75 mg at 07/06/19 0856  . feeding supplement (NEPRO CARB STEADY) liquid 237 mL  237 mL Oral Q24H Oda Kilts, MD   237 mL at 07/04/19 1640  . feeding supplement (PRO-STAT SUGAR FREE 64) liquid 30 mL  30 mL Oral TID BM Zeyfang, David, PA-C      . ferric citrate (AURYXIA) tablet 210 mg  210 mg Oral TID WC Zeyfang, David, PA-C      . fluticasone furoate-vilanterol (BREO ELLIPTA) 200-25 MCG/INH 1 puff  1 puff Inhalation Daily Katherine Roan, MD   1 puff at 07/06/19 0725  . heparin injection 5,000 Units  5,000 Units Subcutaneous Q8H Katherine Roan, MD   5,000 Units at 07/06/19 [phone]  . ipratropium-albuterol (DUONEB) 0.5-2.5 (3) MG/3ML nebulizer solution 3 mL  3 mL Nebulization QID Oda Kilts, MD   3 mL at 07/06/19 0725  . metoprolol succinate (TOPROL-XL) 24 hr tablet 50 mg  50 mg Oral Daily Ladona Horns, MD   50 mg at 07/06/19 5400  . multivitamin (RENA-VIT) tablet 1 tablet  1 tablet Oral QHS Oda Kilts, MD   1 tablet at 07/05/19 2149  . pantoprazole  (PROTONIX) EC tablet 20 mg  20 mg Oral Daily Ladona Horns, MD   20 mg at 07/06/19 0853  . QUEtiapine (SEROQUEL) tablet 200 mg  200 mg Oral QHS Katherine Roan, MD   200 mg at 07/05/19 2147  . umeclidinium bromide (INCRUSE ELLIPTA) 62.5 MCG/INH 1 puff  1 puff Inhalation Daily Ladona Horns, MD   1 puff at 07/06/19 0725     Discharge Medications: Please see discharge summary for a list of discharge medications.  Relevant Imaging Results:  Relevant Lab Results:   Additional Information SS#: [id number]  Geralynn Ochs, LCSW

## 2019-07-06 NOTE — Progress Notes (Addendum)
Physical Therapy Treatment Patient Details Name: Kari Robinson MRN: [id number] DOB: 03-Nov-1951 Today's Date: 07/06/2019    History of Present Illness Pt adm with dyspnea due to volume overload due to missed HD sessions. PMHx: COPD, HTN, HLD, depression, ESRD on MWF.    PT Comments    Pt continues to be limited by weak tremulous extremities. Continue to strongly recommend SNF. Will need w/c for home to be able to manage coming to/from HD.   Follow Up Recommendations  SNF(if pt refuses then maximize [facility] services)     Equipment Recommendations  Wheelchair (measurements PT);Wheelchair cushion (measurements PT)    Recommendations for Other Services       Precautions / Restrictions Precautions Precautions: Fall Precaution Comments: frequent falls at home Restrictions Weight Bearing Restrictions: No    Mobility  Bed Mobility Overal bed mobility: Modified Independent Bed Mobility: Supine to Sit;Sit to Supine     Supine to sit: Modified independent (Device/Increase time) Sit to supine: Modified independent (Device/Increase time)      Transfers Overall transfer level: Needs assistance Equipment used: 4-wheeled walker;1 person hand held assist Transfers: Sit to/from [facility] Sit to Stand: Min assist         General transfer comment: Assist for balance and support. Pt with very tremulous extremities and needs support.   Ambulation/Gait Ambulation/Gait assistance: +2 physical assistance;Mod assist Gait Distance (Feet): 2 Feet Assistive device: 4-wheeled walker Gait Pattern/deviations: Step-to pattern;Decreased step length - right;Decreased step length - left;Shuffle Gait velocity: decr Gait velocity interpretation: <1.8 ft/sec, indicate of risk for recurrent falls General Gait Details: Pt with tremulous extremities which incr after taking a couple of steps and knees buckling. Had to bring bed up behind pt.    Stairs             Wheelchair  Mobility    Modified Rankin (Stroke Patients Only)       Balance Overall balance assessment: Needs assistance Sitting-balance support: No upper extremity supported;Feet supported Sitting balance-Leahy Scale: Good     Standing balance support: Bilateral upper extremity supported Standing balance-Leahy Scale: Poor Standing balance comment: walker and min to mod assist for static standing. Extremities tremulous                            Cognition Arousal/Alertness: Awake/alert Behavior During Therapy: WFL for tasks assessed/performed Overall Cognitive Status: History of cognitive impairments - at baseline                                 General Comments: Has demonstrated poor insight to her medical issues      Exercises      General Comments General comments (skin integrity, edema, etc.): Pt on 3L of O2      Pertinent Vitals/Pain Pain Assessment: No/denies pain    Home Living                      Prior Function            PT Goals (current goals can now be found in the care plan section) Acute Rehab PT Goals Patient Stated Goal: go home Progress towards PT goals: Progressing toward goals    Frequency    Min 3X/week      PT Plan Current plan remains appropriate    Co-evaluation  AM-PAC PT "6 Clicks" Mobility   Outcome Measure  Help needed turning from your back to your side while in a flat bed without using bedrails?: None Help needed moving from lying on your back to sitting on the side of a flat bed without using bedrails?: None Help needed moving to and from a bed to a chair (including a wheelchair)?: A Lot Help needed standing up from a chair using your arms (e.g., wheelchair or bedside chair)?: A Little Help needed to walk in hospital room?: Total Help needed climbing 3-5 steps with a railing? : Total 6 Click Score: 15    End of Session Equipment Utilized During Treatment: Gait  belt;Oxygen Activity Tolerance: Patient limited by fatigue Patient left: in bed;with call bell/phone within reach;with bed alarm set   PT Visit Diagnosis: Unsteadiness on feet (R26.81);Other abnormalities of gait and mobility (R26.89);History of falling (Z91.81);Muscle weakness (generalized) (M62.81)     Time: 3785-8850 PT Time Calculation (min) (ACUTE ONLY): 12 min  Charges:  $Therapeutic Activity: 8-22 mins                     [place] Pager [phone] Office [place] 07/06/2019, 5:06 PM

## 2019-07-07 LAB — RENAL FUNCTION PANEL
Albumin: 3.2 g/dL — ABNORMAL LOW (ref 3.5–5.0)
Albumin: 3.2 g/dL — ABNORMAL LOW (ref 3.5–5.0)
Anion gap: 14 (ref 5–15)
Anion gap: 13 (ref 5–15)
BUN: 56 mg/dL — ABNORMAL HIGH (ref 8–23)
BUN: 58 mg/dL — ABNORMAL HIGH (ref 8–23)
CO2: 22 mmol/L (ref 22–32)
CO2: 20 mmol/L — ABNORMAL LOW (ref 22–32)
Calcium: 8.4 mg/dL — ABNORMAL LOW (ref 8.9–10.3)
Calcium: 8.4 mg/dL — ABNORMAL LOW (ref 8.9–10.3)
Chloride: 101 mmol/L (ref 98–111)
Chloride: 102 mmol/L (ref 98–111)
Creatinine, Ser: 6.87 mg/dL — ABNORMAL HIGH (ref 0.44–1.00)
Creatinine, Ser: 7 mg/dL — ABNORMAL HIGH (ref 0.44–1.00)
GFR calc Af Amer: 7 mL/min — ABNORMAL LOW (ref >60)
GFR calc Af Amer: 6 mL/min — ABNORMAL LOW (ref >60)
GFR calc non Af Amer: 6 mL/min — ABNORMAL LOW (ref >60)
GFR calc non Af Amer: 6 mL/min — ABNORMAL LOW (ref >60)
Glucose, Bld: 102 mg/dL — ABNORMAL HIGH (ref 70–99)
Glucose, Bld: 126 mg/dL — ABNORMAL HIGH (ref 70–99)
Phosphorus: 6.2 mg/dL — ABNORMAL HIGH (ref 2.5–4.6)
Phosphorus: 5.9 mg/dL — ABNORMAL HIGH (ref 2.5–4.6)
Potassium: 3.8 mmol/L (ref 3.5–5.1)
Potassium: 3.8 mmol/L (ref 3.5–5.1)
Sodium: 137 mmol/L (ref 135–145)
Sodium: 135 mmol/L (ref 135–145)

## 2019-07-07 LAB — CBC
HCT: 29.9 % — ABNORMAL LOW (ref 36.0–46.0)
Hemoglobin: 9.5 g/dL — ABNORMAL LOW (ref 12.0–15.0)
MCH: 34.4 pg — ABNORMAL HIGH (ref 26.0–34.0)
MCHC: 31.8 g/dL (ref 30.0–36.0)
MCV: 108.3 fL — ABNORMAL HIGH (ref 80.0–100.0)
Platelets: 177 10*3/uL (ref 150–400)
RBC: 2.76 MIL/uL — ABNORMAL LOW (ref 3.87–5.11)
RDW: 18.4 % — ABNORMAL HIGH (ref 11.5–15.5)
WBC: 7.8 10*3/uL (ref 4.0–10.5)
nRBC: 0 % (ref 0.0–0.2)

## 2019-07-07 NOTE — Progress Notes (Signed)
[facility] Progress Note  Subjective: no c/o seen on HD, no SOB , +chronic cough  Vitals:   07/07/19 0915 07/07/19 0933 07/07/19 1000 07/07/19 1030  BP: 123/72 112/65 102/77 108/79  Pulse: 73 75 79 74  Resp:   16   Temp:      TempSrc:      SpO2:      Weight:      Height:        Inpatient medications: . aspirin EC  81 mg Oral Daily  . buPROPion  150 mg Oral BID WC  . Chlorhexidine Gluconate Cloth  6 each Topical Daily  . cinacalcet  30 mg Oral Q M,W,F-1800  . clopidogrel  75 mg Oral Daily  . feeding supplement (NEPRO CARB STEADY)  237 mL Oral Q24H  . feeding supplement (PRO-STAT SUGAR FREE 64)  30 mL Oral TID BM  . ferric citrate  210 mg Oral TID WC  . fluticasone furoate-vilanterol  1 puff Inhalation Daily  . heparin  5,000 Units Subcutaneous Q8H  . ipratropium-albuterol  3 mL Nebulization QID  . multivitamin  1 tablet Oral QHS  . pantoprazole  20 mg Oral Daily  . QUEtiapine  200 mg Oral QHS  . umeclidinium bromide  1 puff Inhalation Daily   . sodium chloride         Exam:  alert,  No distress, coughing a lot, smoker's cough  +jvd  Chest L rales, R cta  Cor reg no RG  Abd soft ntnd  Ext 1+ edema  RUE RCF+bruit   CXR 8/16 1pm - IMPRESSION: Interval minimal linear atelectasis at the right lung base. 2. Stable cardiomegaly and mild chronic interstitial lung disease  CXR 8/19 - no active disease   Dialysis: [facility] M[facility]   3.5h  350/1.5  68.5kg  2/2  Hep none  RUE RCF  aranesp 60 ug /wk  auryxia 210 tid ac   Assessment/ Plan: 1. Acute on chron resp failure/ vol overload - CXR vasc congestion, improved post HD sessions here. Has underlying combined CHF and COPD so likely is more sensitive to vol overload from missed HD. Is at baseline home O2 at 3L.  2. ESRD - on HD MWF. Missed OP HD. HD today.  3. COPD - on home O2 at 3 L [place] 4. HTN/ volume - 3kg over per HD this am. Home metoprolol and Imdur on hold for low BP's 5. Anemia ckd - esa as  needed 6. MBD ckd - cont meds 7. H/o STEMI w/ stent placement x 2 8. Debilty/ bilat LE weakness - seen in May by NSurg who offered L4-5 surgery for nerve decompression. Per primary she will f/u in OP setting for this.  9. Dispo - awaiting SNF placement  [facility] 07/07/2019, 12:40 PM  Iron/TIBC/Ferritin/ %Sat    Component Value Date/Time   IRON 70 06/17/2019 1128   TIBC 265 06/17/2019 1128   FERRITIN 1,208 (H) 06/17/2019 1128   IRONPCTSAT 26 06/17/2019 1128   Recent Labs  Lab 07/07/19 0857  NA 135  K 3.8  CL 102  CO2 20*  GLUCOSE 126*  BUN 58*  CREATININE 7.00*  CALCIUM 8.4*  PHOS 5.9*  ALBUMIN 3.2*   No results for input(s): AST, ALT, ALKPHOS, BILITOT, PROT in the last 168 hours. Recent Labs  Lab 07/07/19 0856  WBC 7.8  HGB 9.5*  HCT 29.9*  PLT 177

## 2019-07-07 NOTE — Progress Notes (Signed)
Subjective: Pt seen at dialysis this morning. Resting in bed. Pt feeling well. No acute concerns.  Objective:  Vital signs in last 24 hours: Vitals:   07/06/19 1749 07/06/19 1900 07/06/19 1933 07/06/19 2342  BP: (!) 128/55 135/77  (!) 142/83  Pulse:  76  78  Resp:  15  18  Temp: (!) 97.5 F (36.4 C) 97.6 F (36.4 C)  98 F (36.7 C)  TempSrc: Oral Axillary  Axillary  SpO2:  100% 99% 100%  Weight:      Height:      Physical Exam Vitals signs and nursing note reviewed.  Constitutional:      General: She is not in acute distress.    Appearance: She is ill-appearing (chronically).  Cardiovascular:     Rate and Rhythm: Normal rate and regular rhythm.     Heart sounds: Normal heart sounds. No murmur. No friction rub. No gallop.   Pulmonary:     Effort: Pulmonary effort is normal. No tachypnea or respiratory distress.     Breath sounds: Decreased air movement present.     Comments: Scattered wheezes and rhonchi. Abdominal:     General: Abdomen is flat. Bowel sounds are normal.     Palpations: Abdomen is soft.  Musculoskeletal:     Right lower leg: No edema.     Left lower leg: No edema.  Neurological:     Mental Status: She is alert.     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 Volume overload due to missed dialysis sessions appears to be driving her dyspnea this admission, but underlying COPD and anxiety also contributing.    ESRD on HD (MWF)- Pt missing outpatient dialysis for a variety of reasons Nephrology following, appreciate recommendations. - encouraged consideration of SNF to improve dialysis compliance              - sensitive to slight volume excess due to her  tenuous cardiac/pulmonary status             - back to MWF hemodialysis schedule  - aranesp as needed - labs today; K 3.8, BUN 58, Cr 7.00, Phos 5.9, Ca 8.4 - PT eval - recommending SNF placement   HTN Elevated pressures on admission secondary to anxiety and volume overload - holding home metoprolol 50mg  PO daily on dialysis days - would benefit from beta-blockade in the future if pressures will tolerate it   CAD with STEMI w/ stent placement x2 - ASA 81mg and plavix 75mg daily  - holding isosobide mononitrate 30mg  PO daily qhs   COPD - pt's cough and sputum production unchanged, dyspnea does not appear to be due to exacerbation. No need for steroid burst. - Breo inhaler 1 puff daily - Incruse inhaler 1 puff daily -duonebs (ipratropium-albuterol) q6hr - received solumedrol 125mg  IV once in ED  Insomnia - continue home seroquel 200mg  qhs   Diet - Renal Diet122mL fluid restriction DVT ppx- heparin 5,000 units subq q8h Fluids - none CODE STATUS- FULL CODE   Dispo: pending SNF placement.  Will need to reschedule outpatient pulmonology and cardiology appointments that have been missed this month. Would also recommend neurosurgery follow-up in the outpatient setting for chronic back  pain and lumbar stenosis.    Ladona Horns, MD 07/07/2019, 6:28 AM Pager: [phone]

## 2019-07-07 NOTE — Plan of Care (Signed)

## 2019-07-07 NOTE — TOC Progression Note (Signed)
Transition of Care [facility]) - Progression Note    Patient Details  Name: Carron Mcmurry MRN: [id number] Date of Birth: [birth year]  Transition of Care [facility]) CM/SW [place], [place] Phone Number: 07/07/2019, 3:54 PM  Clinical Narrative:  CSW following for discharge plan. Patient continues to have no bed offers. [facility] is reviewing patient information, but they are unable to provide transport for patient to her hemodialysis slot. [facility] could offer on the patient if her hemodialysis location and time can be changed. CSW contacted Renal Coordinator for assistance in seeing if her HD time and location can be moved while she is in SNF. Renal Coordinator will follow up; may not have an answer until Monday.   Patient's PASRR is also under manual review. CSW obtained 30 day note signed from MD and faxed to [facility] for review.   CSW to follow.    Expected Discharge Plan: Skilled Nursing Facility Barriers to Discharge: Continued Medical Work up, SNF Pending bed offer  Expected Discharge Plan and Services Expected Discharge Plan: [place] arrangements for the past 2 months: Single Family Home Expected Discharge Date: 07/06/19                                     Social Determinants of Health (SDOH) Interventions    Readmission Risk Interventions Readmission Risk Prevention Plan 07/04/2019  Transportation Screening Complete  Medication Review Press photographer) Complete  HRI or Home Care Consult Complete  Palliative Care Screening Not Applicable  Some recent data might be hidden

## 2019-07-07 NOTE — Progress Notes (Signed)
Renal Navigator received call from [place] at [facility] requesting patient's OP HD schedule. Renal Navigator spoke with [facility] to confirm current schedule. Message left for Ebony Hail stating that patient's current schedule at [facility] HD [facility] is MWF at 12:00pm, with 11:40am arrival time.  Alphonzo Cruise, [place] Renal Navigator [phone]

## 2019-07-07 NOTE — Progress Notes (Signed)
  Date: 07/07/2019  Patient name: Kari Robinson  Medical record number: [id number]  Date of birth: [birth year]   I have seen and evaluated this patient and I have discussed the plan of care with the house staff. Please see their note for complete details. I concur with their findings.  Lenice Pressman, M.D., Ph.D. 07/07/2019, 2:58 PM

## 2019-07-07 NOTE — TOC Progression Note (Signed)
LATE NOTE SUBMISSION     Transition of Care [facility]) - Progression Note    Patient Details  Name: Kari Robinson MRN: [id number] Date of Birth: [birth year]  Transition of Care [facility]) CM/SW [place], [place] Phone Number: 07/07/2019, 10:57 AM  Clinical Narrative:  CSW alerted by medical team that patient is not agreeable to SNF, but unsure if patient can discharge to SNF with trilogy that has been being looked into for patient to go home with. CSW staffed case with social work Medical laboratory scientific officer and SNF Admissions liaisons to discuss, and there are very limited SNFs that are able to provide the nursing expertise needed for a trilogy; unsure which SNFs could handle, definitely none in [place]. CSW provided information to MD, and they will evaluate whether patient needs it or not. CSW to follow.    Expected Discharge Plan: Skilled Nursing Facility Barriers to Discharge: Continued Medical Work up, SNF Pending bed offer  Expected Discharge Plan and Services Expected Discharge Plan: [place] arrangements for the past 2 months: Single Family Home Expected Discharge Date: 07/06/19                                     Social Determinants of Health (SDOH) Interventions    Readmission Risk Interventions Readmission Risk Prevention Plan 07/04/2019  Transportation Screening Complete  Medication Review Press photographer) Complete  HRI or Home Care Consult Complete  Palliative Care Screening Not Applicable  Some recent data might be hidden

## 2019-07-07 NOTE — TOC Progression Note (Addendum)
Transition of Care [facility]) - Progression Note    Patient Details  Name: Alzada Brazee MRN: [id number] Date of Birth: Jun 17, 1951  Transition of Care [facility]) CM/SW [place],  Phone Number: 07/07/2019, 11:02 AM  Clinical Narrative:   CSW following for discharge plan. CSW contacted by RN due to discharge order in the system. Patient has no bed offers for SNF at this time, faxed out earlier today. CSW has nowhere to send patient for SNF at this time due to no bed offers, cannot discharge today. Patient's PASRR also remains under manual review due to mental illness. CSW alerted RN of barrier. CSW to follow.     Expected Discharge Plan: Skilled Nursing Facility Barriers to Discharge: Continued Medical Work up, SNF Pending bed offer  Expected Discharge Plan and Services Expected Discharge Plan: [place] arrangements for the past 2 months: Single Family Home Expected Discharge Date: 07/06/19                                     Social Determinants of Health (SDOH) Interventions    Readmission Risk Interventions Readmission Risk Prevention Plan 07/04/2019  Transportation Screening Complete  Medication Review Press photographer) Complete  HRI or Home Care Consult Complete  Palliative Care Screening Not Applicable  Some recent data might be hidden

## 2019-07-08 LAB — RENAL FUNCTION PANEL
Albumin: 3.6 g/dL (ref 3.5–5.0)
Anion gap: 14 (ref 5–15)
BUN: 42 mg/dL — ABNORMAL HIGH (ref 8–23)
CO2: 25 mmol/L (ref 22–32)
Calcium: 8.6 mg/dL — ABNORMAL LOW (ref 8.9–10.3)
Chloride: 95 mmol/L — ABNORMAL LOW (ref 98–111)
Creatinine, Ser: 4.84 mg/dL — ABNORMAL HIGH (ref 0.44–1.00)
GFR calc Af Amer: 10 mL/min — ABNORMAL LOW (ref >60)
GFR calc non Af Amer: 9 mL/min — ABNORMAL LOW (ref >60)
Glucose, Bld: 98 mg/dL (ref 70–99)
Phosphorus: 5.1 mg/dL — ABNORMAL HIGH (ref 2.5–4.6)
Potassium: 4.2 mmol/L (ref 3.5–5.1)
Sodium: 134 mmol/L — ABNORMAL LOW (ref 135–145)

## 2019-07-08 MED ORDER — METOPROLOL SUCCINATE ER 25 MG PO TB24
25.0000 mg | ORAL_TABLET | ORAL | Status: DC
  Filled 2019-07-08: qty 1

## 2019-07-08 MED ORDER — METOPROLOL SUCCINATE ER 25 MG PO TB24
12.5000 mg | ORAL_TABLET | Freq: Once | ORAL | Status: AC
  Administered 2019-07-08: 12.5 mg via ORAL
  Filled 2019-07-08: qty 1

## 2019-07-08 MED ORDER — IPRATROPIUM-ALBUTEROL 0.5-2.5 (3) MG/3ML IN SOLN
3.0000 mL | Freq: Three times a day (TID) | RESPIRATORY_TRACT | Status: DC
  Administered 2019-07-08 – 2019-07-09 (×5): 3 mL via RESPIRATORY_TRACT
  Filled 2019-07-08 (×6): qty 3

## 2019-07-08 NOTE — Progress Notes (Signed)
[facility] Progress Note  Subjective: cough improving, no new c/o  Vitals:   07/08/19 0324 07/08/19 0746 07/08/19 0824 07/08/19 0825  BP: 130/76   (!) 159/98  Pulse: 93   97  Resp: 18   18  Temp: 98.2 F (36.8 C)  97.9 F (36.6 C) 97.9 F (36.6 C)  TempSrc: Oral  Oral Oral  SpO2:  100%  97%  Weight:      Height:        Inpatient medications: . aspirin EC  81 mg Oral Daily  . buPROPion  150 mg Oral BID WC  . Chlorhexidine Gluconate Cloth  6 each Topical Daily  . cinacalcet  30 mg Oral Q M,W,F-1800  . clopidogrel  75 mg Oral Daily  . feeding supplement (NEPRO CARB STEADY)  237 mL Oral Q24H  . feeding supplement (PRO-STAT SUGAR FREE 64)  30 mL Oral TID BM  . ferric citrate  210 mg Oral TID WC  . fluticasone furoate-vilanterol  1 puff Inhalation Daily  . heparin  5,000 Units Subcutaneous Q8H  . ipratropium-albuterol  3 mL Nebulization TID  . metoprolol succinate  12.5 mg Oral Once  . [START ON 07/11/2019] metoprolol succinate  25 mg Oral QODAY  . multivitamin  1 tablet Oral QHS  . pantoprazole  20 mg Oral Daily  . QUEtiapine  200 mg Oral QHS  . umeclidinium bromide  1 puff Inhalation Daily   . sodium chloride         Exam:  alert,  No distress, coughing a lot, smoker's cough  +jvd  Chest L rales, R cta  Cor reg no RG  Abd soft ntnd  Ext 1+ edema  RUE RCF+bruit   CXR 8/16 1pm - IMPRESSION: Interval minimal linear atelectasis at the right lung base. 2. Stable cardiomegaly and mild chronic interstitial lung disease  CXR 8/19 - no active disease   Dialysis: [facility] MWF   3.5h  350/1.5  68.5kg  2/2  Hep none  RUE RCF  aranesp 60 ug /wk  auryxia 210 tid ac   Assessment/ Plan: 1. Acute on chron resp failure/ vol overload - CXR vasc congestion, improved post HD here. Has underlying combined CHF and COPD so likely is more sensitive to vol overload from missed HD. Is at baseline home O2 at 3L.  2. ESRD - on HD MWF. Missed OP HD. HD today.  3. COPD - on  home O2 at 3 L [place] 4. HTN/ volume - close to dry wt today. No ^vol one exam. Home metoprolol and Imdur on hold for low BP's 5. Anemia ckd - esa as needed 6. MBD ckd - cont meds 7. H/o STEMI w/ stent placement x 2 8. Debilty/ bilat LE weakness - seen in May by NSurg who offered L4-5 surgery for nerve decompression. Per primary she will f/u in OP setting for this.  9. Dispo - awaiting SNF placement  [facility] 07/08/2019, 10:50 AM  Iron/TIBC/Ferritin/ %Sat    Component Value Date/Time   IRON 70 06/17/2019 1128   TIBC 265 06/17/2019 1128   FERRITIN 1,208 (H) 06/17/2019 1128   IRONPCTSAT 26 06/17/2019 1128   Recent Labs  Lab 07/08/19 0430  NA 134*  K 4.2  CL 95*  CO2 25  GLUCOSE 98  BUN 42*  CREATININE 4.84*  CALCIUM 8.6*  PHOS 5.1*  ALBUMIN 3.6   No results for input(s): AST, ALT, ALKPHOS, BILITOT, PROT in the last 168 hours. Recent  Labs  Lab 07/07/19 0856  WBC 7.8  HGB 9.5*  HCT 29.9*  PLT 177

## 2019-07-08 NOTE — Progress Notes (Signed)
   Subjective: Pt reports doing well at dialysis yesterday, finished her whole session.  Denies dyspnea, chest pain.    Objective:  Vital signs in last 24 hours: Vitals:   07/07/19 1524 07/07/19 1611 07/07/19 1958 07/08/19 0324  BP:  135/79 (!) 171/75 130/76  Pulse: 84 (!) 42 88 93  Resp: 16 20 20 18   Temp:  97.7 F (36.5 C) 98 F (36.7 C) 98.2 F (36.8 C)  TempSrc:   Oral Oral  SpO2:  100% 99%   Weight:      Height:        Cardiac:  normal rate and rhythm, clear s1 and s2, no murmurs, rubs or gallops, no LE edema Pulmonary: CTAB, not in distress Abdominal: non distended abdomen, soft and nontender Psych: Alert, conversant, in good spirits   Assessment/Plan: Pt is a 68 year old F with history of COPD, 200 pack year smoking history, ESRD on HD, 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 Volume overload due to missed dialysis sessions appears to be driving her dyspnea this admission, but underlying COPD and anxiety also contributing.    ESRD on HD (MWF)- Pt missing outpatient dialysis for a variety of reasons, sensitive to slight volume excess due to her tenuous cardiac/pulmonary status. Nephrology following, appreciate recommendations - working on SNF placement to improve strength and assistance with getting up and to dialysis  - back to MWF hemodialysis schedule    HTN Elevated pressures on admission secondary to anxiety and volume overload - holding home metoprolol 50mg  PO daily on dialysis days - would benefit from beta-blockade in the future if pressures will tolerate it   CAD with hx of STEMI w/ stent placement x2 - continue ASA 81mg and plavix 75mg daily  - holding isosobide mononitrate 30mg  PO daily qhs   COPD - pt's cough and sputum production unchanged, dyspnea does  not appear to be due to exacerbation. No need for steroid burst. - Breo inhaler 1 puff daily - Incruse inhaler 1 puff daily -duonebs (ipratropium-albuterol) now changed to Q4 hours by respiratory and given additional PRN albuterol nebs  Insomnia - continue home seroquel 200mg  qhs   Diet - Renal Diet1291mL fluid restriction DVT ppx- heparin 5,000 units subq q8h Fluids - none CODE STATUS- FULL CODE   Dispo: pending SNF placement.  Will need to reschedule outpatient pulmonology and cardiology appointments that have been missed this month. Would also recommend neurosurgery follow-up in the outpatient setting for chronic back pain and lumbar stenosis.    Katherine Roan, MD 07/08/2019, 7:10 AM

## 2019-07-09 LAB — RENAL FUNCTION PANEL
Albumin: 3.3 g/dL — ABNORMAL LOW (ref 3.5–5.0)
Anion gap: 16 — ABNORMAL HIGH (ref 5–15)
BUN: 81 mg/dL — ABNORMAL HIGH (ref 8–23)
CO2: 22 mmol/L (ref 22–32)
Calcium: 8.5 mg/dL — ABNORMAL LOW (ref 8.9–10.3)
Chloride: 98 mmol/L (ref 98–111)
Creatinine, Ser: 6.91 mg/dL — ABNORMAL HIGH (ref 0.44–1.00)
GFR calc Af Amer: 6 mL/min — ABNORMAL LOW (ref >60)
GFR calc non Af Amer: 6 mL/min — ABNORMAL LOW (ref >60)
Glucose, Bld: 101 mg/dL — ABNORMAL HIGH (ref 70–99)
Phosphorus: 5.9 mg/dL — ABNORMAL HIGH (ref 2.5–4.6)
Potassium: 4.2 mmol/L (ref 3.5–5.1)
Sodium: 136 mmol/L (ref 135–145)

## 2019-07-09 MED ORDER — CHLORHEXIDINE GLUCONATE CLOTH 2 % EX PADS
6.0000 | MEDICATED_PAD | Freq: Every day | CUTANEOUS | Status: DC
  Administered 2019-07-10 – 2019-07-12 (×3): 6 via TOPICAL

## 2019-07-09 NOTE — Progress Notes (Signed)
  Date: 07/09/2019  Patient name: Kari Robinson  Medical record number: [id number]  Date of birth: Sep 26, 1951   I have seen and evaluated this patient and I have discussed the plan of care with the house staff. Please see their note for complete details. I concur with their findings.  Lenice Pressman, M.D., Ph.D. 07/09/2019, 1:26 PM

## 2019-07-09 NOTE — Progress Notes (Addendum)
[facility] Progress Note  Subjective: no new c/o, has a list of SNF facilities to pick through  Vitals:   07/08/19 2127 07/08/19 2300 07/09/19 0300 07/09/19 0810  BP: (!) 155/83 130/72 115/75 125/77  Pulse: 88 83 84 76  Resp: (!) 23 (!) 23 16 20   Temp:  97.8 F (36.6 C) 98 F (36.7 C) 97.6 F (36.4 C)  TempSrc:  Oral Oral Axillary  SpO2: 98% 95% 95% 99%  Weight:      Height:        Inpatient medications:  aspirin EC  81 mg Oral Daily   buPROPion  150 mg Oral BID WC   Chlorhexidine Gluconate Cloth  6 each Topical Daily   cinacalcet  30 mg Oral Q M,W,F-1800   clopidogrel  75 mg Oral Daily   feeding supplement (NEPRO CARB STEADY)  237 mL Oral Q24H   feeding supplement (PRO-STAT SUGAR FREE 64)  30 mL Oral TID BM   ferric citrate  210 mg Oral TID WC   fluticasone furoate-vilanterol  1 puff Inhalation Daily   heparin  5,000 Units Subcutaneous Q8H   ipratropium-albuterol  3 mL Nebulization TID   [START ON 07/11/2019] metoprolol succinate  25 mg Oral QODAY   multivitamin  1 tablet Oral QHS   pantoprazole  20 mg Oral Daily   QUEtiapine  200 mg Oral QHS   umeclidinium bromide  1 puff Inhalation Daily    sodium chloride         Exam:  alert,  No distress, coughing a lot, smoker's cough  +jvd  Chest L rales, R cta  Cor reg no RG  Abd soft ntnd  Ext 1+ edema  RUE RCF+bruit   CXR 8/16 1pm - IMPRESSION: Interval minimal linear atelectasis at the right lung base. 2. Stable cardiomegaly and mild chronic interstitial lung disease  CXR 8/19 - no active disease   Dialysis: [place] MWF   3.5h  350/1.5  68.5kg  2/2  Hep none  RUE RCF  aranesp 60 ug /wk  auryxia 210 tid ac   Assessment/ Plan: Acute on chron resp failure/ vol overload - noncardiogenic pulm edema due to vol overload on admit had CXR vasc congestion, improved with HD here. Is at baseline home O2 at 3L.  ESRD - on HD MWF. Missed OP HD. HD Monday.  COPD - on home O2 at 3 L [place] HTN/ volume - close to  dry wt, no ^vol on exam. Home metoprolol and Imdur for now for low BP's.  Anemia ckd - esa as needed MBD ckd - cont meds CAD h/o stenting Debilty/ bilat LE weakness - seen in May by NSurg who offered L4-5 surgery for nerve decompression. Pt declined then but now is interested. Per primary she will f/u in OP setting for this.  Dispo - awaiting SNF placement  [facility] 07/09/2019, 10:16 AM  Iron/TIBC/Ferritin/ %Sat    Component Value Date/Time   IRON 70 06/17/2019 1128   TIBC 265 06/17/2019 1128   FERRITIN 1,208 (H) 06/17/2019 1128   IRONPCTSAT 26 06/17/2019 1128   Recent Labs  Lab 07/09/19 0605  NA 136  K 4.2  CL 98  CO2 22  GLUCOSE 101*  BUN 81*  CREATININE 6.91*  CALCIUM 8.5*  PHOS 5.9*  ALBUMIN 3.3*   No results for input(s): AST, ALT, ALKPHOS, BILITOT, PROT in the last 168 hours. Recent Labs  Lab 07/07/19 0856  WBC 7.8  HGB 9.5*  HCT  29.9*  PLT 177

## 2019-07-09 NOTE — Progress Notes (Signed)
   Subjective: patient reports feeling well today. No SOB. Asking about rehab  Objective:  Vital signs in last 24 hours: Vitals:   07/08/19 2127 07/08/19 2300 07/09/19 0300 07/09/19 0810  BP: (!) 155/83 130/72 115/75 125/77  Pulse: 88 83 84 76  Resp: (!) 23 (!) 23 16 20   Temp:  97.8 F (36.6 C) 98 F (36.7 C) 97.6 F (36.4 C)  TempSrc:  Oral Oral Axillary  SpO2: 98% 95% 95% 99%  Weight:      Height:        Cardiac:  normal rate and rhythm, clear s1 and s2, no murmurs, rubs or gallops, no LE edema Pulmonary: CTAB, not in distress Abdominal: non distended abdomen, soft and nontender Psych: Alert, conversant, in good spirits   Assessment/Plan: Pt is a 68 year old F with history of COPD, 200 pack year smoking history, ESRD on HD, 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 Volume overload due to missed dialysis sessions appears to be driving her dyspnea this admission, but underlying COPD and anxiety also contributing.    ESRD on HD (MWF)- Pt missing outpatient dialysis for a variety of reasons, sensitive to slight volume excess due to her tenuous cardiac/pulmonary status. Nephrology following, appreciate recommendations - working on SNF placement to improve strength and assistance with getting up and to dialysis  - back to MWF hemodialysis schedule   HTN Elevated pressures on admission secondary to anxiety and volume overload - holding home metoprolol 50mg  PO daily on dialysis days - would benefit from beta-blockade in the future if pressures will tolerate it   CAD with hx of STEMI w/ stent placement x2 - continue ASA 81mg and plavix 75mg daily  - holding isosobide mononitrate 30mg  PO daily qhs   COPD - pt's cough and sputum production unchanged, dyspnea does not appear to be  due to exacerbation. No need for steroid burst. - Breo inhaler 1 puff daily - Incruse inhaler 1 puff daily -duonebs (ipratropium-albuterol) now changed to Q4 hours by respiratory and given additional PRN albuterol nebs  Insomnia - continue home seroquel 200mg  qhs   Diet - Renal Diet1274mL fluid restriction DVT ppx- heparin 5,000 units subq q8h Fluids - none CODE STATUS- FULL CODE   Dispo: pending SNF placement.  Will need to reschedule outpatient pulmonology and cardiology appointments that have been missed this month. Would also recommend neurosurgery follow-up in the outpatient setting for chronic back pain and lumbar stenosis.    Katherine Roan, MD 07/09/2019, 11:31 AM

## 2019-07-10 ENCOUNTER — Telehealth: Payer: Medicare Other | Admitting: Cardiology

## 2019-07-10 DIAGNOSIS — M48061 Spinal stenosis, lumbar region without neurogenic claudication: Secondary | ICD-10-CM

## 2019-07-10 DIAGNOSIS — G8929 Other chronic pain: Secondary | ICD-10-CM

## 2019-07-10 DIAGNOSIS — M549 Dorsalgia, unspecified: Secondary | ICD-10-CM

## 2019-07-10 LAB — RENAL FUNCTION PANEL
Albumin: 3.4 g/dL — ABNORMAL LOW (ref 3.5–5.0)
Albumin: 3.6 g/dL (ref 3.5–5.0)
Anion gap: 17 — ABNORMAL HIGH (ref 5–15)
Anion gap: 14 (ref 5–15)
BUN: 106 mg/dL — ABNORMAL HIGH (ref 8–23)
BUN: 37 mg/dL — ABNORMAL HIGH (ref 8–23)
CO2: 21 mmol/L — ABNORMAL LOW (ref 22–32)
CO2: 25 mmol/L (ref 22–32)
Calcium: 9.1 mg/dL (ref 8.9–10.3)
Calcium: 8.5 mg/dL — ABNORMAL LOW (ref 8.9–10.3)
Chloride: 99 mmol/L (ref 98–111)
Chloride: 93 mmol/L — ABNORMAL LOW (ref 98–111)
Creatinine, Ser: 7.98 mg/dL — ABNORMAL HIGH (ref 0.44–1.00)
Creatinine, Ser: 4.07 mg/dL — ABNORMAL HIGH (ref 0.44–1.00)
GFR calc Af Amer: 5 mL/min — ABNORMAL LOW (ref >60)
GFR calc Af Amer: 12 mL/min — ABNORMAL LOW (ref >60)
GFR calc non Af Amer: 5 mL/min — ABNORMAL LOW (ref >60)
GFR calc non Af Amer: 11 mL/min — ABNORMAL LOW (ref >60)
Glucose, Bld: 108 mg/dL — ABNORMAL HIGH (ref 70–99)
Glucose, Bld: 129 mg/dL — ABNORMAL HIGH (ref 70–99)
Phosphorus: 6.3 mg/dL — ABNORMAL HIGH (ref 2.5–4.6)
Phosphorus: 3.4 mg/dL (ref 2.5–4.6)
Potassium: 4.8 mmol/L (ref 3.5–5.1)
Potassium: 4.2 mmol/L (ref 3.5–5.1)
Sodium: 137 mmol/L (ref 135–145)
Sodium: 132 mmol/L — ABNORMAL LOW (ref 135–145)

## 2019-07-10 LAB — CBC
HCT: 29.4 % — ABNORMAL LOW (ref 36.0–46.0)
Hemoglobin: 9.6 g/dL — ABNORMAL LOW (ref 12.0–15.0)
MCH: 34.4 pg — ABNORMAL HIGH (ref 26.0–34.0)
MCHC: 32.7 g/dL (ref 30.0–36.0)
MCV: 105.4 fL — ABNORMAL HIGH (ref 80.0–100.0)
Platelets: 156 10*3/uL (ref 150–400)
RBC: 2.79 MIL/uL — ABNORMAL LOW (ref 3.87–5.11)
RDW: 17.9 % — ABNORMAL HIGH (ref 11.5–15.5)
WBC: 8.2 10*3/uL (ref 4.0–10.5)
nRBC: 0 % (ref 0.0–0.2)

## 2019-07-10 LAB — MAGNESIUM: Magnesium: 2 mg/dL (ref 1.7–2.4)

## 2019-07-10 MED ORDER — PRO-STAT SUGAR FREE PO LIQD
30.0000 mL | Freq: Two times a day (BID) | ORAL | Status: DC
  Administered 2019-07-11 – 2019-07-12 (×3): 30 mL via ORAL
  Filled 2019-07-10 (×4): qty 30

## 2019-07-10 MED ORDER — METOPROLOL TARTRATE 25 MG PO TABS
25.0000 mg | ORAL_TABLET | Freq: Once | ORAL | Status: AC
  Administered 2019-07-10: 25 mg via ORAL
  Filled 2019-07-10: qty 1

## 2019-07-10 MED ORDER — IPRATROPIUM-ALBUTEROL 0.5-2.5 (3) MG/3ML IN SOLN
3.0000 mL | RESPIRATORY_TRACT | Status: DC | PRN
  Administered 2019-07-10: 3 mL via RESPIRATORY_TRACT
  Filled 2019-07-10: qty 3

## 2019-07-10 NOTE — Progress Notes (Signed)
Nutrition Follow-up  DOCUMENTATION CODES:   Not applicable  INTERVENTION:   - Continue Nepro Shake po daily, each supplement provides 425 kcal and 19 grams protein  - Continue renal MVI daily  - Pro-stat 30 ml BID, each supplement provides 100 kcal and 15 grams of protein  NUTRITION DIAGNOSIS:   Increased nutrient needs related to chronic illness (ESRD on HD) as evidenced by estimated needs.  Ongoing, being addressed via supplements  GOAL:   Patient will meet greater than or equal to 90% of their needs  Progressing  MONITOR:   PO intake, Labs, I & O's, Supplement acceptance, Weight trends  REASON FOR ASSESSMENT:   Consult Assessment of nutrition requirement/status  ASSESSMENT:   68 year old female with medical history significant of COPD with multiple prior hospitalizations for exacerbations (most recently 9 days ago), left lower lung lobectomy s/p lung cancer in 2017, ESRD on HD, HTN, HLD, STEMI w/ stent placement x 2, Afib w RVR, who presented to ED for ongoing SOB since her discharge on 8/13.  Pt awaiting SNF placement.  Weight is up a total of 9 lbs since admission.  Spoke with pt at bedside. Pt reports having a good appetite currently. Pt states that PTA, she had a good appetite and ate 3-4 meals daily.  Pt is unsure of her UBW but denies noticing any weight loss. Pt reports her clothes are fitting the same.  Pt endorses drinking Nepro and taking Pro-stat. Will continue with current plan of care.  Meal Completion: 50-75% x last 8 recorded meals  Medications reviewed and include: Sensipar, Nepro daily, pro-stat 30 ml TID, ferric citrate, rena-vit, Protonix  Labs reviewed: phosphorus 6.3, hemoglobin 9.6  HD net UF today: 1700 ml, post-HD weight 71.3 kg I/O's: -4.7 L since admit  NUTRITION - FOCUSED PHYSICAL EXAM:    Most Recent Value  Orbital Region  No depletion  Upper Arm Region  No depletion  Thoracic and Lumbar Region  No depletion  Buccal Region   No depletion  Temple Region  Mild depletion  Clavicle Bone Region  Mild depletion  Clavicle and Acromion Bone Region  Mild depletion  Scapular Bone Region  No depletion  Dorsal Hand  No depletion  Patellar Region  No depletion  Anterior Thigh Region  No depletion  Posterior Calf Region  No depletion  Edema (RD Assessment)  Mild [BLE]  Hair  Reviewed  Eyes  Reviewed  Mouth  Reviewed  Skin  Reviewed  Nails  Reviewed       Diet Order:   Diet Order            Diet - low sodium heart healthy        Diet renal with fluid restriction Fluid restriction: 1200 mL Fluid; Room service appropriate? Yes; Fluid consistency: Thin  Diet effective now              EDUCATION NEEDS:   No education needs have been identified at this time  Skin:  Skin Assessment: Reviewed RN Assessment  Last BM:  07/08/19  Height:   Ht Readings from Last 1 Encounters:  07/03/19 5\' 4"  (1.626 m)    Weight:   Wt Readings from Last 1 Encounters:  07/10/19 71.3 kg    Ideal Body Weight:     BMI:  Body mass index is 26.98 kg/m.  Estimated Nutritional Needs:   Kcal:  2075-2275  Protein:  104-114  Fluid:  1.2 L/day per MD    Gaynell Face,  MS, RD, LDN Inpatient Clinical Dietitian Pager: [phone] Weekend/After Hours: [phone]

## 2019-07-10 NOTE — Progress Notes (Signed)
PT Cancellation Note  Patient Details Name: Kari Robinson MRN: [id number] DOB: 20-May-1951   Cancelled Treatment:    Reason Eval/Treat Not Completed: Patient at procedure or test/unavailable. Pt in HD. PT to re-attempt as time allows.    Lorriane Shire 07/10/2019, 8:22 AM   Lorrin Goodell, PT  Office # [phone] Pager [phone]

## 2019-07-10 NOTE — Progress Notes (Signed)
Pt had 5 beat run VT.  Asymptomatic and VSS, MD notified.

## 2019-07-10 NOTE — Significant Event (Signed)
Rapid Response Event Note  Overview:  Called by bedside RN with concerns of pt with frequent runs of VT throughout the day.    Initial Focused Assessment: On arrival, pt sitting in bed watching tv. A&Ox4, HR 98, BP 148/87, RR 20, spO2 98%. Pt asymptomatic, had HD today with 2L removed. While in room pt with a couple episodes of 5-7 beat run VT. Pt denies feeling any palpitations or SOB.   Interventions: Primary team called and at bedside prior to my arrival Labs to check electrolytes EKG Metoprolol  Plan of Care (if not transferred): Discussed with RN possible causes of ectopy. Since pt asymptomatic and not sustaining, will continue to monitor and see what lab results show. Pt also encouraged to call staff if she starts to feel palpitations, pain, sob or anything out of the norm. RN instructed to call with any changes or concerns. Will add pt to radar and follow up .   Event Summary:  called at  1720   Event ended at  [place]

## 2019-07-10 NOTE — Significant Event (Signed)
Rapid Response Event Note  Called d/t MEWS-2. Pt BP-88/76. MD notified by bedside RN and RN will recheck BP.  Pt alert and oriented per bedside RN. Pt having frequent PVCs but no more vtach.  Please call RRT if assistance needed.  Update: 2034- BP 95/78       Dillard Essex

## 2019-07-10 NOTE — Procedures (Signed)
I was present at this dialysis session. I have reviewed the session itself and made appropriate changes.   K 4.8, Hb 9.6.  R FA AVF. BP stable. Goal UF 2L.  Pt s/o complaint. 2K bath.  No heparin given.    Filed Weights   07/07/19 0815 07/07/19 1220 07/10/19 0725  Weight: 71.3 kg 69.3 kg 73 kg    Recent Labs  Lab 07/10/19 0504  NA 137  K 4.8  CL 99  CO2 21*  GLUCOSE 108*  BUN 106*  CREATININE 7.98*  CALCIUM 9.1  PHOS 6.3*    Recent Labs  Lab 07/05/19 0816 07/07/19 0856 07/10/19 0504  WBC 8.1 7.8 8.2  HGB 10.0* 9.5* 9.6*  HCT 32.4* 29.9* 29.4*  MCV 108.4* 108.3* 105.4*  PLT 224 177 156    Scheduled Meds: . aspirin EC  81 mg Oral Daily  . buPROPion  150 mg Oral BID WC  . Chlorhexidine Gluconate Cloth  6 each Topical Q0600  . cinacalcet  30 mg Oral Q M,W,F-1800  . clopidogrel  75 mg Oral Daily  . feeding supplement (NEPRO CARB STEADY)  237 mL Oral Q24H  . feeding supplement (PRO-STAT SUGAR FREE 64)  30 mL Oral TID BM  . ferric citrate  210 mg Oral TID WC  . fluticasone furoate-vilanterol  1 puff Inhalation Daily  . heparin  5,000 Units Subcutaneous Q8H  . ipratropium-albuterol  3 mL Nebulization TID  . [START ON 07/11/2019] metoprolol succinate  25 mg Oral QODAY  . multivitamin  1 tablet Oral QHS  . pantoprazole  20 mg Oral Daily  . QUEtiapine  200 mg Oral QHS  . umeclidinium bromide  1 puff Inhalation Daily   Continuous Infusions: . sodium chloride     PRN Meds:.albuterol   Pearson Grippe  MD 07/10/2019, 8:37 AM

## 2019-07-10 NOTE — Progress Notes (Signed)
  Date: 07/10/2019  Patient name: Kari Robinson  Medical record number: [id number]  Date of birth: [birth year]   I have seen and evaluated this patient and I have discussed the plan of care with the house staff. Please see their note for complete details. I concur with their findings.  Lenice Pressman, M.D., Ph.D. 07/10/2019, 11:24 AM

## 2019-07-10 NOTE — Progress Notes (Signed)
Telemetry called RN stating pt having sustained run Vtach 20-30 beats but tele stated unable to count all the beats.  Tele stated sustained for 2-3 minutes before coming out of Vtach and immediately back in. MD notified and let RN know they would come to bedside to assess patient.

## 2019-07-10 NOTE — Progress Notes (Signed)
Nurse not made aware of elevated b/p, blood pressure cuff recycled with new b/p cuff with correct size

## 2019-07-10 NOTE — TOC Progression Note (Signed)
Transition of Care [facility]) - Progression Note    Patient Details  Name: Kari Robinson MRN: [id number] Date of Birth: [birth year]  Transition of Care [facility]) CM/SW [place], [place] Phone Number: 07/10/2019, 4:16 PM  Clinical Narrative:   CSW following for discharge plan. CSW alerted by Renal Navigator that patient can transfer HD sites for SNF. CSW updated [place], and they can accept with the change in HD site. CSW confirmed with Renal Navigator to move the HD site and time for patient's SNF stay. [place] [facility] Ramus has initiated Ship broker.   CSW received call from [place] with PASRR that they will call for patient's PASRR assessment tomorrow at 3 pm. CSW to follow.    Expected Discharge Plan: Skilled Nursing Facility Barriers to Discharge: Continued Medical Work up, SNF Pending bed offer  Expected Discharge Plan and Services Expected Discharge Plan: [place] arrangements for the past 2 months: Single Family Home Expected Discharge Date: 07/06/19                                     Social Determinants of Health (SDOH) Interventions    Readmission Risk Interventions Readmission Risk Prevention Plan 07/04/2019  Transportation Screening Complete  Medication Review Press photographer) Complete  HRI or Home Care Consult Complete  Palliative Care Screening Not Applicable  Some recent data might be hidden

## 2019-07-10 NOTE — Progress Notes (Signed)
Renal Navigator received notification from Agricultural consultant at [place] clinic that they are able to accommodate patient on a MWF schedule for OP HD treatment if she is to be accepted at [facility] for rehab. Renal Navigator has NOT requested transfer from [facility] clinic at this time and is awaiting confirmation from CSW/L. Ward Chatters that placement at [facility] is the plan for patient before doing so. Renal Navigator left message for CSW.  Alphonzo Cruise, [place] Renal Navigator [phone]

## 2019-07-10 NOTE — Progress Notes (Signed)
Renal Navigator received call from CSW/L. [place] requesting to see if patient could received OP HD treatment at [place] clinic if she were accepted at [facility] for rehab. She reports that they will only consider acceptance if this is the case due to transportation needs. Renal Navigator spoke with clinic secretary, who stated that clinic manager and charge RN are gone for the day. Renal Navigator told that this will be evaluated on Monday, 07/10/19.  Alphonzo Cruise, [place] Renal Navigator

## 2019-07-10 NOTE — Progress Notes (Signed)
Paged by RN for frequent runs of ventricular tachycardia on cardiac monitoring. Pt seen at the bedside. Asymptomatic, watching TV in bed. States "people keep coming in to check on me and I feel okay." She denies feeling short of breath, chest pain, or palpitations. HR 100, RR 21, BP 148/87, and O2 99 on 2L [place].  Alarm review on telemetry showed frequent 5-7 beat runs of VT with 2-4 beats of sinus rhythm. Runs of V tach were never over 20-30 seconds, and pt has remained hemodynamically stable. Discussed the pt with cardiology on the phone, who were in agreement of the plan below and recommended cardiology evaluation if pt became symptomatic or beats of V tach lasted longer than 20-30 seconds.  Plan - repeat labs to check electrolytes - EKG - give 25mg  PO metoprolol

## 2019-07-10 NOTE — Progress Notes (Signed)
Renal Navigator sent message to [facility] to follow up on request to accommodate patient for OP HD treatment if placed at [facility] for SNF. Renal Navigator will continue to follow.  Alphonzo Cruise, [place] Renal Navigator [phone]

## 2019-07-10 NOTE — Progress Notes (Signed)
   Subjective: Pt seen at dialysis this morning. Doing dialysis in the bed. Feeling well. Continues to ask about rehab placement.  Objective:  Vital signs in last 24 hours: Vitals:   07/10/19 0100 07/10/19 0200 07/10/19 0300 07/10/19 0538  BP:    (!) 171/104  Pulse: 98 (!) 101 (!) 106 (!) 107  Resp: 17 (!) 21 (!) 33 18  Temp:    98.4 F (36.9 C)  TempSrc:    Oral  SpO2: 100% 95% 96% 99%  Weight:      Height:      Physical Exam Vitals signs and nursing note reviewed.  Constitutional:      General: She is not in acute distress.    Appearance: She is not ill-appearing (chronically).  Cardiovascular:     Rate and Rhythm: Normal rate and regular rhythm.     Heart sounds: Normal heart sounds.  Pulmonary:     Effort: Pulmonary effort is normal. No tachypnea or respiratory distress.     Breath sounds: Normal breath sounds.     Comments: On home 2L [place]. Scattered wheezes throughout. Abdominal:     General: Bowel sounds are normal.     Palpations: Abdomen is soft.  Musculoskeletal:     Right lower leg: No edema.     Left lower leg: No edema.  Neurological:     Mental Status: She is alert.     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 Volume overload due to missed dialysis sessions appears to be driving her dyspnea this admission, but underlying COPD and anxiety also contributing.    ESRD on HD (MWF)- Pt missing outpatient dialysis for a variety of reasons Nephrology following, appreciate recommendations - sensitive to slight volume excess due to her tenuous cardiac/pulmonary status - back to MWF hemodialysis schedule - aranesp as needed - working on SNF placement for improving  strength and increasing outpatient dialysis complicance   HTN Elevated pressures on admission secondary to anxiety and volume overload - holding metoprolol 25mg  PO daily on dialysis days - would benefit from beta-blockade on off dialysis days if pressures will tolerate it   CAD with STEMI w/ stent placement x2 - ASA 81mg and plavix 75mg daily  - holding isosobide mononitrate 30mg  PO daily qhs   COPD - pt's cough and sputum production unchanged, denies dyspnea at this time - Breo inhaler 1 puff daily - Incruse inhaler 1 puff daily -duonebs (ipratropium-albuterol) q6hr - received solumedrol 125mg  IV once in ED  Insomnia - continue home seroquel 200mg  qhs   Diet - Renal Diet1246mL fluid restriction DVT ppx- heparin 5,000 units subq q8h Fluids - none CODE STATUS- FULL CODE   Dispo: continuing to await SNF placement.  Will need to reschedule outpatient pulmonology and cardiology appointments that have been missed this month. Would also recommend neurosurgery follow-up in the outpatient setting for chronic back pain and lumbar stenosis.    Ladona Horns, MD 07/10/2019, 6:18 AM Pager: [phone]

## 2019-07-11 ENCOUNTER — Inpatient Hospital Stay: Payer: Medicare Other | Admitting: Pulmonary Disease

## 2019-07-11 DIAGNOSIS — I472 Ventricular tachycardia: Secondary | ICD-10-CM

## 2019-07-11 LAB — RENAL FUNCTION PANEL
Albumin: 3.5 g/dL (ref 3.5–5.0)
Anion gap: 16 — ABNORMAL HIGH (ref 5–15)
BUN: 52 mg/dL — ABNORMAL HIGH (ref 8–23)
CO2: 24 mmol/L (ref 22–32)
Calcium: 8.7 mg/dL — ABNORMAL LOW (ref 8.9–10.3)
Chloride: 91 mmol/L — ABNORMAL LOW (ref 98–111)
Creatinine, Ser: 5.17 mg/dL — ABNORMAL HIGH (ref 0.44–1.00)
GFR calc Af Amer: 9 mL/min — ABNORMAL LOW (ref >60)
GFR calc non Af Amer: 8 mL/min — ABNORMAL LOW (ref >60)
Glucose, Bld: 93 mg/dL (ref 70–99)
Phosphorus: 5.3 mg/dL — ABNORMAL HIGH (ref 2.5–4.6)
Potassium: 4.3 mmol/L (ref 3.5–5.1)
Sodium: 131 mmol/L — ABNORMAL LOW (ref 135–145)

## 2019-07-11 MED ORDER — PANTOPRAZOLE SODIUM 20 MG PO TBEC: 20.0000 mg | DELAYED_RELEASE_TABLET | Freq: Every day | ORAL | 0 refills | Status: DC

## 2019-07-11 MED ORDER — METOPROLOL SUCCINATE ER 25 MG PO TB24
25.0000 mg | ORAL_TABLET | Freq: Every day | ORAL | Status: DC
  Administered 2019-07-11 – 2019-07-12 (×2): 25 mg via ORAL
  Filled 2019-07-11: qty 1

## 2019-07-11 MED ORDER — PRO-STAT SUGAR FREE PO LIQD: 30.0000 mL | Freq: Two times a day (BID) | ORAL | 0 refills | Status: DC

## 2019-07-11 MED ORDER — DARBEPOETIN ALFA 100 MCG/0.5ML IJ SOSY
100.0000 ug | PREFILLED_SYRINGE | INTRAMUSCULAR | Status: DC
  Administered 2019-07-12: 100 ug via INTRAVENOUS
  Filled 2019-07-11: qty 0.5

## 2019-07-11 NOTE — Progress Notes (Signed)
Dyspnea on exertion, pt using bedside commode

## 2019-07-11 NOTE — Progress Notes (Addendum)
Physical Therapy Treatment Patient Details Name: Kari Robinson MRN: [id number] DOB: 02/12/1951 Today's Date: 07/11/2019    History of Present Illness Pt adm with dyspnea due to volume overload due to missed HD sessions. PMHx: COPD, HTN, HLD, depression, ESRD on MWF.    PT Comments    Pt making slow progress. Continue to recommend SNF at DC.   Follow Up Recommendations  SNF     Equipment Recommendations  None recommended by PT    Recommendations for Other Services       Precautions / Restrictions Precautions Precautions: Fall Precaution Comments: frequent falls at home Restrictions Weight Bearing Restrictions: No    Mobility  Bed Mobility Overal bed mobility: Modified Independent Bed Mobility: Supine to Sit     Supine to sit: Modified independent (Device/Increase time)        Transfers Overall transfer level: Needs assistance Equipment used: Rolling walker (2 wheeled) Transfers: Sit to/from Stand Sit to Stand: Min assist         General transfer comment: Assist for balance and support  Ambulation/Gait Ambulation/Gait assistance: Min assist Gait Distance (Feet): 5 Feet(5' x 1, 2' x 1, 2' x 1) Assistive device: Rolling walker (2 wheeled) Gait Pattern/deviations: Step-to pattern;Decreased step length - right;Decreased step length - left Gait velocity: decr Gait velocity interpretation: <1.8 ft/sec, indicate of risk for recurrent falls General Gait Details: Assist for balance and support. Pt with tremulous extremities.   Stairs             Wheelchair Mobility    Modified Rankin (Stroke Patients Only)       Balance Overall balance assessment: Needs assistance Sitting-balance support: No upper extremity supported;Feet supported Sitting balance-Leahy Scale: Good     Standing balance support: Bilateral upper extremity supported Standing balance-Leahy Scale: Poor Standing balance comment: walker and min assist for static standing                            Cognition Arousal/Alertness: Awake/alert Behavior During Therapy: WFL for tasks assessed/performed Overall Cognitive Status: History of cognitive impairments - at baseline                                 General Comments: Has demonstrated poor insight to her medical issues      Exercises      General Comments General comments (skin integrity, edema, etc.): Pt on 2L of O2      Pertinent Vitals/Pain Pain Assessment: No/denies pain    Home Living                      Prior Function            PT Goals (current goals can now be found in the care plan section) Acute Rehab PT Goals Patient Stated Goal: go home Progress towards PT goals: Progressing toward goals    Frequency    Min 2X/week      PT Plan Current plan remains appropriate;Frequency needs to be updated    Co-evaluation              AM-PAC PT "6 Clicks" Mobility   Outcome Measure  Help needed turning from your back to your side while in a flat bed without using bedrails?: None Help needed moving from lying on your back to sitting on the side of a flat bed without using bedrails?: None  Help needed moving to and from a bed to a chair (including a wheelchair)?: A Little Help needed standing up from a chair using your arms (e.g., wheelchair or bedside chair)?: A Little Help needed to walk in hospital room?: A Lot Help needed climbing 3-5 steps with a railing? : Total 6 Click Score: 17    End of Session Equipment Utilized During Treatment: Gait belt;Oxygen Activity Tolerance: Patient tolerated treatment well Patient left: with call bell/phone within reach;in chair;with chair alarm set Nurse Communication: Mobility status PT Visit Diagnosis: Unsteadiness on feet (R26.81);Other abnormalities of gait and mobility (R26.89);History of falling (Z91.81);Muscle weakness (generalized) (M62.81)     Time: 0761-5183 PT Time Calculation (min) (ACUTE ONLY): 21  min  Charges:  $Gait Training: 8-22 mins                     [place] Pager [phone] Office [place] 07/11/2019, 12:48 PM

## 2019-07-11 NOTE — TOC Progression Note (Signed)
Transition of Care [facility]) - Progression Note    Patient Details  Name: Kari Robinson MRN: [id number] Date of Birth: 04-Nov-1951  Transition of Care [facility]) CM/SW [place], [place] Phone Number: 07/11/2019, 11:34 AM  Clinical Narrative:   CSW met with patient to discuss bed offer of [facility]. Patient hopeful for other facilities but understanding that there's only one option. Patient agreeable to completing paperwork and participating in PASRR assessment this afternoon, hopeful for a discharge soon so that she can get stronger and get home as soon as possible.     Expected Discharge Plan: Skilled Nursing Facility Barriers to Discharge: Continued Medical Work up, SNF Pending bed offer  Expected Discharge Plan and Services Expected Discharge Plan: [place] arrangements for the past 2 months: Single Family Home Expected Discharge Date: 07/06/19                                     Social Determinants of Health (SDOH) Interventions    Readmission Risk Interventions Readmission Risk Prevention Plan 07/04/2019  Transportation Screening Complete  Medication Review Press photographer) Complete  HRI or Home Care Consult Complete  Palliative Care Screening Not Applicable  Some recent data might be hidden

## 2019-07-11 NOTE — TOC Transition Note (Signed)
Transition of Care [facility]) - CM/SW Discharge Note   Patient Details  Name: Kari Robinson MRN: [id number] Date of Birth: 1951/04/12  Transition of Care [facility]) CM/SW Contact:  Geralynn Ochs, LCSW Phone Number: 07/11/2019, 4:48 PM   Clinical Narrative:   Patient PASRR assessment completed this afternoon, but [place] is agreeable to taking patient before PASRR finalized due to [facility] holding requirement on PASRR due to COVID-19. Patient continues to await updated COVID test but can discharge to SNF if COVID test results before 6 pm.  Should COVID test result, RN to call for transport. RN to call report to [phone], Room 121.    Final next level of care: Skilled Nursing Facility Barriers to Discharge: Barriers Resolved   Patient Goals and CMS Choice        Discharge Placement              Patient chooses bed at: [facility]) Patient to be transferred to facility by: [place] Name of family member notified: Self Patient and family notified of of transfer: 07/11/19  Discharge Plan and Services                                     Social Determinants of Health ([place]) Interventions     Readmission Risk Interventions Readmission Risk Prevention Plan 07/04/2019  Transportation Screening Complete  Medication Review ([place]) Complete  HRI or Home Care Consult Complete  Palliative Care Screening Not Applicable  Some recent data might be hidden

## 2019-07-11 NOTE — Progress Notes (Signed)
Patient's OP HD treatment has been transferred temporarily from [place] [facility] to [place] [facility] for purposes of placement at [facility] with intentions of returning to [place] [facility] post rehab. Transfer process complete and [place] clinic is prepared to start patient as soon as tomorrow, Wednesday, 07/12/19 if she is ready for discharge today. Per CSW/L. Paisley, patient has PASRR assessment this afternoon, so she may not be able to discharge today. If discharge cannot take place today, she will need to have HD in the hospital tomorrow, since she is discharging to a new SNF placement. If this is the case, she will start in the clinic on Friday, 07/14/19. Patient's schedule at [place] is MWF with a 12:10pm seat time. She needs to arrive at 11:45am for her appointments. On her first day of treatment, she needs to arrive at 11:00am to complete intake paperwork at the [place] clinic.   [place] [facility] [address] [place] [phone]  Alphonzo Cruise, [place] Renal Navigator [phone]

## 2019-07-11 NOTE — Progress Notes (Signed)
  Date: 07/11/2019  Patient name: Kari Robinson  Medical record number: [id number]  Date of birth: [birth year]   I have seen and evaluated this patient and I have discussed the plan of care with the house staff. Please see their note for complete details. I concur with their findings with the following additions/corrections:   As noted by Dr. Ronnald Ramp, had some asymptomatic runs of VT last night, improved with resumption of metoprolol. Metoprolol had been held earlier due to hypotension, will resume and see how BP does with dialysis, may need further adjustment down the road, but can be done as an outpatient. Ok for discharge to SNF as soon as bed available and paperwork complete.  Lenice Pressman, M.D., Ph.D. 07/11/2019, 2:21 PM

## 2019-07-11 NOTE — Progress Notes (Signed)
Assessed pt with RR of 30. Pt had an Increased RR due to exertion, moving from bed to chair with PT.  Pt RR is now 20 and resting in chair comfortably. Will continue to monitor.

## 2019-07-11 NOTE — Progress Notes (Signed)
[facility] Progress Note  Subjective: no new c/o, SNF disposition in process. Slept well denies dyspnea. HD yesterday: 1.7L UF and post weight 71.3kg   Vitals:   07/11/19 0754 07/11/19 0800 07/11/19 0849 07/11/19 0850  BP: 136/82     Pulse: 77 76    Resp: (!) 30 12    Temp: 98.2 F (36.8 C)     TempSrc:      SpO2: 96% 96% 99% 95%  Weight:      Height:        Inpatient medications: . aspirin EC  81 mg Oral Daily  . buPROPion  150 mg Oral BID WC  . Chlorhexidine Gluconate Cloth  6 each Topical Q0600  . cinacalcet  30 mg Oral Q M,W,F-1800  . clopidogrel  75 mg Oral Daily  . [START ON 07/12/2019] darbepoetin (ARANESP) injection - DIALYSIS  100 mcg Intravenous Q Wed-HD  . feeding supplement (NEPRO CARB STEADY)  237 mL Oral Q24H  . feeding supplement (PRO-STAT SUGAR FREE 64)  30 mL Oral BID BM  . ferric citrate  210 mg Oral TID WC  . fluticasone furoate-vilanterol  1 puff Inhalation Daily  . heparin  5,000 Units Subcutaneous Q8H  . metoprolol succinate  25 mg Oral Daily  . multivitamin  1 tablet Oral QHS  . pantoprazole  20 mg Oral Daily  . QUEtiapine  200 mg Oral QHS  . umeclidinium bromide  1 puff Inhalation Daily   . sodium chloride         Exam: Well appearing, NAD RRR nl s1s2 Diminished in bases, no crackles No LEE R RC AVF +B/T NCAT EOMI    Dialysis: [place] M[facility]   3.5h  350/1.5  68.5kg  2/2  Hep none  RUE RCF  aranesp 60 ug /wk  auryxia 210 tid ac   Assessment/ Plan: 1. Acute on chron resp failure/ vol overload - noncardiogenic pulm edema due to vol overload on admit had CXR vasc congestion, improved with HD here. Is at baseline home O2 at 3L.  2. ESRD - on HD MWF. Missed OP HD. Cont HD on schedule: 2K, Probe to EDW, 3.5h, AVF Qb 350 no heparin 3. COPD - on home O2 at 3 L [place]  4. HTN/ volume - close to dry wt, no ^vol on exam. 5. Anemia ckd - cont ESA  6. MBD ckd - cont meds  7. CAD h/o stenting 8. Debilty/ bilat LE weakness - seen in May  by NSurg who offered L4-5 surgery for nerve decompression. Pt declined then but now is interested. Per primary she will f/u in OP setting for this.  9. Dispo - awaiting SNF placement  [facility] 07/11/2019, 9:43 AM  Iron/TIBC/Ferritin/ %Sat    Component Value Date/Time   IRON 70 06/17/2019 1128   TIBC 265 06/17/2019 1128   FERRITIN 1,208 (H) 06/17/2019 1128   IRONPCTSAT 26 06/17/2019 1128   Recent Labs  Lab 07/11/19 0502  NA 131*  K 4.3  CL 91*  CO2 24  GLUCOSE 93  BUN 52*  CREATININE 5.17*  CALCIUM 8.7*  PHOS 5.3*  ALBUMIN 3.5   No results for input(s): AST, ALT, ALKPHOS, BILITOT, PROT in the last 168 hours. Recent Labs  Lab 07/10/19 0504  WBC 8.2  HGB 9.6*  HCT 29.4*  PLT 156

## 2019-07-11 NOTE — Care Management Important Message (Signed)
Important Message  Patient Details  Name: Kari Robinson MRN: [id number] Date of Birth: [birth year]   Medicare Important Message Given:  Yes     [name] 07/11/2019, 2:13 PM

## 2019-07-11 NOTE — Progress Notes (Signed)
   Subjective: Pt seen at the bedside this morning. Feeling well with breathing improved. No acute concerns today. Denies chest pain, lightheadedness, or pre-syncope. Review of telemetry alarms showed decreased frequency of runs of V tach and HR improved to 70-80s.  Objective:  Vital signs in last 24 hours: Vitals:   07/10/19 2137 07/10/19 2205 07/11/19 0000 07/11/19 0429  BP: 119/84 106/80 112/68 122/76  Pulse: 80  79 80  Resp:   20 17  Temp:      TempSrc:      SpO2:   95% 96%  Weight:      Height:      Physical Exam Vitals signs and nursing note reviewed.  Constitutional:      General: She is not in acute distress. Cardiovascular:     Rate and Rhythm: Normal rate and regular rhythm.     Heart sounds: Normal heart sounds.  Pulmonary:     Effort: Pulmonary effort is normal. No tachypnea or respiratory distress.     Breath sounds: Normal breath sounds.     Comments: Scattered wheeze, though improved from previous days Abdominal:     General: Bowel sounds are normal.     Palpations: Abdomen is soft.  Musculoskeletal:     Right lower leg: No edema.     Left lower leg: No edema.  Neurological:     Mental Status: She is alert.      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 Volume overload due to missed dialysis sessions appears to be driving her dyspnea this admission, but underlying COPD and anxiety also contributing.    ESRD on HD (MWF)- Pt missing outpatient dialysis for a variety of reasons Nephrology following, appreciate recommendations - sensitive to slight volume excess due to her tenuous cardiac/pulmonary status - back to MWF hemodialysis schedule - aranesp as needed  - working on SNF placement for improving strength and increasing outpatient dialysis complicance   Frequent runs of V tach Increased burden of 5-7 beats of V tach per 2-4 beats of sinus rhythm yesterday thought to be secondary to beta blocker withdrawal and rebound tachycardia. Electrolytes normal with Mg 2.0, K 4.2, Phos 3.4. HR and rhythm improved yesterday evening after dose of 25mg  metoprolol.  - will resume metoprolol 25mg  PO daily HTN CAD with STEMI w/ stent placement x2 - ASA 81mg and plavix 75mg daily  - holding isosobide mononitrate 30mg  PO daily qhs   COPD - pt's cough and sputum production unchanged, denies dyspnea at this time - Breo inhaler 1 puff daily - Incruse inhaler 1 puff daily -duonebs (ipratropium-albuterol) q6hr - received solumedrol 125mg  IV once in ED  Insomnia - continue home seroquel 200mg  qhs   Diet - Renal Diet1257mL fluid restriction DVT ppx- heparin 5,000 units subq q8h Fluids - none CODE STATUS- FULL CODE   Dispo: continuing to await SNF placement.  Will need to reschedule outpatient pulmonology and cardiology appointments that have been missed this month. Would also recommend neurosurgery follow-up in the outpatient setting for chronic back pain and lumbar stenosis.    Ladona Horns, MD 07/11/2019, 6:38 AM Pager: [phone]

## 2019-07-12 DIAGNOSIS — Z885 Allergy status to narcotic agent status: Secondary | ICD-10-CM

## 2019-07-12 DIAGNOSIS — Z888 Allergy status to other drugs, medicaments and biological substances status: Secondary | ICD-10-CM

## 2019-07-12 LAB — RENAL FUNCTION PANEL
Albumin: 3.4 g/dL — ABNORMAL LOW (ref 3.5–5.0)
Anion gap: 17 — ABNORMAL HIGH (ref 5–15)
BUN: 86 mg/dL — ABNORMAL HIGH (ref 8–23)
CO2: 24 mmol/L (ref 22–32)
Calcium: 9 mg/dL (ref 8.9–10.3)
Chloride: 92 mmol/L — ABNORMAL LOW (ref 98–111)
Creatinine, Ser: 6.77 mg/dL — ABNORMAL HIGH (ref 0.44–1.00)
GFR calc Af Amer: 7 mL/min — ABNORMAL LOW (ref >60)
GFR calc non Af Amer: 6 mL/min — ABNORMAL LOW (ref >60)
Glucose, Bld: 106 mg/dL — ABNORMAL HIGH (ref 70–99)
Phosphorus: 7.1 mg/dL — ABNORMAL HIGH (ref 2.5–4.6)
Potassium: 4.5 mmol/L (ref 3.5–5.1)
Sodium: 133 mmol/L — ABNORMAL LOW (ref 135–145)

## 2019-07-12 LAB — NOVEL CORONAVIRUS, NAA (HOSP ORDER, SEND-OUT TO REF LAB; TAT 18-24 HRS): SARS-CoV-2, NAA: NOT DETECTED

## 2019-07-12 LAB — CBC
HCT: 30.9 % — ABNORMAL LOW (ref 36.0–46.0)
Hemoglobin: 10 g/dL — ABNORMAL LOW (ref 12.0–15.0)
MCH: 33.8 pg (ref 26.0–34.0)
MCHC: 32.4 g/dL (ref 30.0–36.0)
MCV: 104.4 fL — ABNORMAL HIGH (ref 80.0–100.0)
Platelets: 165 10*3/uL (ref 150–400)
RBC: 2.96 MIL/uL — ABNORMAL LOW (ref 3.87–5.11)
RDW: 17.3 % — ABNORMAL HIGH (ref 11.5–15.5)
WBC: 4.5 10*3/uL (ref 4.0–10.5)
nRBC: 0 % (ref 0.0–0.2)

## 2019-07-12 LAB — GLUCOSE, CAPILLARY: Glucose-Capillary: 111 mg/dL — ABNORMAL HIGH (ref 70–99)

## 2019-07-12 LAB — SARS CORONAVIRUS 2 BY RT PCR (HOSPITAL ORDER, PERFORMED IN ~~LOC~~ HOSPITAL LAB): SARS Coronavirus 2: NEGATIVE

## 2019-07-12 MED ORDER — DARBEPOETIN ALFA 100 MCG/0.5ML IJ SOSY
PREFILLED_SYRINGE | INTRAMUSCULAR | Status: AC
  Filled 2019-07-12: qty 0.5

## 2019-07-12 NOTE — Progress Notes (Signed)
Renal Navigator received call from CSW/L. Paisley that patient's COVID test has resulted as negative and patient will be discharged to SNF today. Renal Navigator notified [facility] of patient's discharge today. Patient will start in the clinic on Friday, 07/14/19.  Alphonzo Cruise, [place] Renal Navigator [phone]

## 2019-07-12 NOTE — Progress Notes (Signed)
Returned  From HD. Alert and oriented. No complaints.

## 2019-07-12 NOTE — Procedures (Signed)
I was present at this dialysis session. I have reviewed the session itself and made appropriate changes.   Stable.  Pending SNF.  2K bath. 2L UF. Pt tolerating treatment well.   Filed Weights   07/10/19 0725 07/10/19 1107 07/12/19 0650  Weight: 73 kg 71.3 kg 70.7 kg    Recent Labs  Lab 07/12/19 0455  NA 133*  K 4.5  CL 92*  CO2 24  GLUCOSE 106*  BUN 86*  CREATININE 6.77*  CALCIUM 9.0  PHOS 7.1*    Recent Labs  Lab 07/07/19 0856 07/10/19 0504  WBC 7.8 8.2  HGB 9.5* 9.6*  HCT 29.9* 29.4*  MCV 108.3* 105.4*  PLT 177 156    Scheduled Meds: . aspirin EC  81 mg Oral Daily  . buPROPion  150 mg Oral BID WC  . Chlorhexidine Gluconate Cloth  6 each Topical Q0600  . cinacalcet  30 mg Oral Q M,W,F-1800  . clopidogrel  75 mg Oral Daily  . darbepoetin (ARANESP) injection - DIALYSIS  100 mcg Intravenous Q Wed-HD  . feeding supplement (NEPRO CARB STEADY)  237 mL Oral Q24H  . feeding supplement (PRO-STAT SUGAR FREE 64)  30 mL Oral BID BM  . ferric citrate  210 mg Oral TID WC  . fluticasone furoate-vilanterol  1 puff Inhalation Daily  . heparin  5,000 Units Subcutaneous Q8H  . metoprolol succinate  25 mg Oral Daily  . multivitamin  1 tablet Oral QHS  . pantoprazole  20 mg Oral Daily  . QUEtiapine  200 mg Oral QHS  . umeclidinium bromide  1 puff Inhalation Daily   Continuous Infusions: . sodium chloride     PRN Meds:.albuterol, ipratropium-albuterol   Pearson Grippe  MD 07/12/2019, 8:49 AM

## 2019-07-12 NOTE — TOC Transition Note (Signed)
Transition of Care [facility]) - CM/SW Discharge Note   Patient Details  Name: Kari Robinson MRN: [id number] Date of Birth: 1951-08-17  Transition of Care [facility]) CM/SW Contact:  Geralynn Ochs, LCSW Phone Number: 07/12/2019, 1:38 PM   Clinical Narrative:   Nurse to call report to [phone], Room 121    Final next level of care: Skilled Nursing Facility Barriers to Discharge: Barriers Resolved   Patient Goals and CMS Choice        Discharge Placement              Patient chooses bed at: [facility]) Patient to be transferred to facility by: [place] Name of family member notified: Self Patient and family notified of of transfer: 07/11/19  Discharge Plan and Services                                     Social Determinants of Health ([place]) Interventions     Readmission Risk Interventions Readmission Risk Prevention Plan 07/04/2019  Transportation Screening Complete  Medication Review (RN Care Manager) Complete  HRI or Home Care Consult Complete  Palliative Care Screening Not Applicable  Some recent data might be hidden

## 2019-07-12 NOTE — Progress Notes (Signed)
   Subjective: Pt seen at dialysis this morning. Feels well, and ready for discharge to SNF. Had no questions or concerns.  Objective:  Vital signs in last 24 hours: Vitals:   07/11/19 2333 07/12/19 0406 07/12/19 0409 07/12/19 0424  BP: 103/70 124/86 124/86   Pulse: 81  70   Resp: 19  (!) 21 19  Temp:  98 F (36.7 C)    TempSrc:  Oral    SpO2: 96%  91% 99%  Weight:      Height:      Physical Exam Constitutional:      General: She is not in acute distress. Cardiovascular:     Rate and Rhythm: Normal rate and regular rhythm.     Heart sounds: Normal heart sounds.  Pulmonary:     Effort: Pulmonary effort is normal. No tachypnea or respiratory distress.     Breath sounds: Normal breath sounds.     Comments: Rhonchi and wheezes significantly improved from prior days. Abdominal:     General: Bowel sounds are normal.     Palpations: Abdomen is soft.  Musculoskeletal:     Right lower leg: No edema.     Left lower leg: No edema.  Skin:    General: Skin is warm and dry.  Neurological:     Mental Status: She is alert.      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 Volume overload due to missed dialysis sessions appears to be driving her dyspnea this admission, but underlying COPD and anxiety also contributing. Pt's symptoms much improved with inpatient dialysis and stable for discharge to SNF.    ESRD on HD (MWF) Nephrology following, appreciate recommendations - sensitive to slight volume excess due to her tenuous cardiac/pulmonary status - back to MWF hemodialysis schedule - aranesp as needed - ready for SNF placement today  Frequent runs of V tach - resolved when beta blocker  restarted HTN CAD with STEMI w/ stent placement x2 - metoprolol 25mg  PO daily - ASA 81mg and plavix 75mg daily  - holding isosobide mononitrate 30mg  PO daily qhs   COPD - pt's cough and sputum production unchanged, denies dyspnea at this time - Breo inhaler 1 puff daily - Incruse inhaler 1 puff daily -duonebs (ipratropium-albuterol) q6hr - received solumedrol 125mg  IV once in ED  Insomnia - continue home seroquel 200mg  qhs   Diet - Renal Diet1273mL fluid restriction DVT ppx- heparin 5,000 units subq q8h Fluids - none CODE STATUS- FULL CODE   Dispo: to SNF today! Discharge summary completed yesterday on 8/25, still accurate with no additional changes needed.  As stated in the discharge paperwork, pt will need to follow-up with outpatient pulmonology, cardiology, neurosurgery, and PCP appointments.   Ladona Horns, MD 07/12/2019, 6:28 AM Pager: [phone]

## 2019-07-19 ENCOUNTER — Encounter (HOSPITAL_COMMUNITY): Payer: Self-pay

## 2019-07-19 ENCOUNTER — Other Ambulatory Visit: Payer: Self-pay

## 2019-07-19 ENCOUNTER — Emergency Department (HOSPITAL_COMMUNITY)
Admission: EM | Admit: 2019-07-19 | Discharge: 2019-07-19 | Disposition: A | Discharge status: Home / Self Care | Payer: Medicare Other | Attending: Emergency Medicine | Admitting: Emergency Medicine

## 2019-07-19 DIAGNOSIS — I2510 Atherosclerotic heart disease of native coronary artery without angina pectoris: Secondary | ICD-10-CM | POA: Insufficient documentation

## 2019-07-19 DIAGNOSIS — Z992 Dependence on renal dialysis: Secondary | ICD-10-CM | POA: Insufficient documentation

## 2019-07-19 DIAGNOSIS — Z7901 Long term (current) use of anticoagulants: Secondary | ICD-10-CM | POA: Diagnosis not present

## 2019-07-19 DIAGNOSIS — R55 Syncope and collapse: Secondary | ICD-10-CM | POA: Insufficient documentation

## 2019-07-19 DIAGNOSIS — E1122 Type 2 diabetes mellitus with diabetic chronic kidney disease: Secondary | ICD-10-CM | POA: Diagnosis not present

## 2019-07-19 DIAGNOSIS — N186 End stage renal disease: Secondary | ICD-10-CM | POA: Diagnosis not present

## 2019-07-19 DIAGNOSIS — I132 Hypertensive heart and chronic kidney disease with heart failure and with stage 5 chronic kidney disease, or end stage renal disease: Secondary | ICD-10-CM | POA: Insufficient documentation

## 2019-07-19 DIAGNOSIS — J449 Chronic obstructive pulmonary disease, unspecified: Secondary | ICD-10-CM | POA: Insufficient documentation

## 2019-07-19 DIAGNOSIS — I5042 Chronic combined systolic (congestive) and diastolic (congestive) heart failure: Secondary | ICD-10-CM | POA: Diagnosis not present

## 2019-07-19 DIAGNOSIS — T829XXA Unspecified complication of cardiac and vascular prosthetic device, implant and graft, initial encounter: Secondary | ICD-10-CM

## 2019-07-19 DIAGNOSIS — Z87891 Personal history of nicotine dependence: Secondary | ICD-10-CM | POA: Insufficient documentation

## 2019-07-19 DIAGNOSIS — Z79899 Other long term (current) drug therapy: Secondary | ICD-10-CM | POA: Diagnosis not present

## 2019-07-19 LAB — CBC
HCT: 31.3 % — ABNORMAL LOW (ref 36.0–46.0)
Hemoglobin: 9.3 g/dL — ABNORMAL LOW (ref 12.0–15.0)
MCH: 34.3 pg — ABNORMAL HIGH (ref 26.0–34.0)
MCHC: 29.7 g/dL — ABNORMAL LOW (ref 30.0–36.0)
MCV: 115.5 fL — ABNORMAL HIGH (ref 80.0–100.0)
Platelets: 224 10*3/uL (ref 150–400)
RBC: 2.71 MIL/uL — ABNORMAL LOW (ref 3.87–5.11)
RDW: 18.2 % — ABNORMAL HIGH (ref 11.5–15.5)
WBC: 5.6 10*3/uL (ref 4.0–10.5)
nRBC: 1.3 % — ABNORMAL HIGH (ref 0.0–0.2)

## 2019-07-19 LAB — COMPREHENSIVE METABOLIC PANEL
ALT: 15 U/L (ref 0–44)
AST: 20 U/L (ref 15–41)
Albumin: 3.6 g/dL (ref 3.5–5.0)
Alkaline Phosphatase: 72 U/L (ref 38–126)
Anion gap: 20 — ABNORMAL HIGH (ref 5–15)
BUN: 23 mg/dL (ref 8–23)
CO2: 20 mmol/L — ABNORMAL LOW (ref 22–32)
Calcium: 7.9 mg/dL — ABNORMAL LOW (ref 8.9–10.3)
Chloride: 97 mmol/L — ABNORMAL LOW (ref 98–111)
Creatinine, Ser: 4.22 mg/dL — ABNORMAL HIGH (ref 0.44–1.00)
GFR calc Af Amer: 12 mL/min — ABNORMAL LOW (ref >60)
GFR calc non Af Amer: 10 mL/min — ABNORMAL LOW (ref >60)
Glucose, Bld: 192 mg/dL — ABNORMAL HIGH (ref 70–99)
Potassium: 4 mmol/L (ref 3.5–5.1)
Sodium: 137 mmol/L (ref 135–145)
Total Bilirubin: 0.6 mg/dL (ref 0.3–1.2)
Total Protein: 6.7 g/dL (ref 6.5–8.1)

## 2019-07-19 MED ORDER — SODIUM CHLORIDE 0.9 % IV BOLUS
250.0000 mL | Freq: Once | INTRAVENOUS | Status: AC
  Administered 2019-07-19: 17:00:00 250 mL via INTRAVENOUS

## 2019-07-19 NOTE — ED Notes (Signed)
All appropriate discharge materials reviewed at length with patient. Time for questions provided. Pt has no other questions at this time and verbalizes understanding of all provided materials.  

## 2019-07-19 NOTE — ED Provider Notes (Addendum)
[facility] EMERGENCY DEPARTMENT Provider Note   CSN: [id number] Arrival date & time: 07/19/19  1616     History   Chief Complaint Chief Complaint  Patient presents with  . Loss of Consciousness  . Fatigue    HPI Kari Robinson is a 68 y.o. female.     Patient with hx esrd/hd, presents via dialysis center via EMS with syncopal event. Patient had approx 30 minutes left in her dialysis session, and got faint/lightheaded. Symptoms acute onset, moderate, episodic, feels improved now. No trauma or fall associated w event. No seizure activity noted. Pt denies any specific c/o. Pt denies pain. No headache. No chest pain or discomfort. No sob. No cough or uri symptoms. Denies palpitations or sense of rapid or slow heartbeat. Denies abd pain. No nausea/vomiting or diarrhea. No dysuria. No recent blood loss, no rectal bleeding. Denies change in meds. Pt unsure if she had eaten anything today. Patient states it is not uncommon for her to feel weak or faint towards the end of her dialysis treatments. Pt denies any focal or unilateral numbness or weakness. No change in speech or vision.   The history is provided by the patient and the EMS personnel. The history is limited by the condition of the patient.  Loss of Consciousness Associated symptoms: no chest pain, no confusion, no fever, no headaches, no palpitations, no shortness of breath and no vomiting     Past Medical History:  Diagnosis Date  . Alcohol abuse   . Arthralgia   . CAD (coronary artery disease)   . Chronic combined systolic and diastolic HF (heart failure) ([place])   . COPD (chronic obstructive pulmonary disease) ([place])   . Depression   . Dyspnea   . ESRD (end stage renal disease) ([place])   . HTN (hypertension)   . Hypercholesterolemia    primarily ldl-p and small particles  . Lung cancer [facility])    Patient reports this with surgery.  No details.    [name] RAS (renal artery stenosis) ([place]) 2002   by cath tysinger   . Smoking     Patient Active Problem List   Diagnosis Date Noted  . Dyspnea 07/02/2019  . Respiratory failure ([place]) 06/28/2019  . Chronic respiratory failure with hypoxia ([place])   . Community acquired pneumonia of right lower lobe of lung ([place])   . COPD with acute exacerbation ([place]) 06/16/2019  . COPD exacerbation ([place]) 05/09/2019  . Leukocytosis 05/09/2019  . Lumbar stenosis 05/09/2019  . Fall 04/01/2019  . Leg weakness, bilateral 04/01/2019  . Tobacco abuse 04/01/2019  . Alcohol abuse 04/01/2019  . Transient loss of consciousness 02/24/2019  . ESRD (end stage renal disease) on dialysis ([place]) 02/24/2019  . Seizure-like activity ([place]) 02/24/2019  . NSTEMI (non-ST elevated myocardial infarction) ([place]) 02/24/2019  . Ischemic cardiomyopathy 02/24/2019  . Hypertensive emergency 09/30/2018  . Type II diabetes mellitus with renal manifestations ([place]) 09/30/2018  . HLD (hyperlipidemia) 09/30/2018  . Depression with anxiety 09/30/2018  . CKD (chronic kidney disease), stage V ([place]) 09/30/2018  . Acute on chronic respiratory failure with hypoxia ([place]) 09/30/2018  . Fluid overload 09/30/2018  . CAD (coronary artery disease) 09/30/2018  . Anemia of chronic disease 09/30/2018  . COPD (chronic obstructive pulmonary disease) ([place])   . Acute on chronic combined systolic and diastolic CHF (congestive heart failure) ([place])   . Solitary right kidney 06/24/2018  . Chronic systolic CHF (congestive heart failure) ([place]) 06/24/2018  . Hypertensive emergency 06/07/2018  . CKD (  chronic kidney disease) stage 5, GFR less than 15 ml/min ([name]) 06/07/2018  . Normocytic normochromic anemia 06/07/2018  . DM (diabetes mellitus), type 2 with renal complications ([place]) 97/12/6376  . Hypertensive urgency 06/07/2018  . Occlusion and stenosis of carotid artery without mention of cerebral infarction 01/19/2012  . RENAL ARTERY STENOSIS 09/10/2010  . ABDOMINAL BRUIT 08/12/2010  . DEPRESSION 08/11/2010  . PERIPHERAL  VASCULAR DISEASE 08/11/2010  . Essential hypertension 08/08/2010  . Coronary atherosclerosis 08/08/2010  . ARTHRALGIA 08/08/2010    Past Surgical History:  Procedure Laterality Date  . abdominal aortogram     perclose of the right femoral artery  . AV FISTULA PLACEMENT Right 07/11/2018   Procedure: Right arm ARTERIOVENOUS FISTULA CREATION;  Surgeon: [name], MD;  Location: [facility];  Service: Vascular;  Laterality: Right;  . AVF placement Right   . CARDIAC CATHETERIZATION     left heart catheterization.  Coronary cineangiography. Lft ventricular cineangiography.    [name] LUNG SURGERY     due to lung cancer per patient's report  . TUBAL LIGATION       OB History   No obstetric history on file.      Home Medications    Prior to Admission medications   Medication Sig Start Date End Date Taking? Authorizing Provider  albuterol (PROVENTIL HFA;VENTOLIN HFA) 108 (90 Base) MCG/ACT inhaler Inhale 2 puffs into the lungs every 4 (four) hours as needed for wheezing or shortness of breath.  11/25/17   [provider]  Amino Acids-Protein Hydrolys (FEEDING SUPPLEMENT, PRO-STAT SUGAR FREE 64,) LIQD Take 30 mLs by mouth 2 (two) times daily between meals. 07/11/19   Ladona Horns, MD  aspirin EC 81 MG tablet Take 81 mg by mouth daily.    [provider]  atorvastatin (LIPITOR) 40 MG tablet Take 40 mg by mouth daily.    [provider]  AURYXIA 1 GM 210 MG(Fe) tablet Take 210-420 mg by mouth See admin instructions. Take 2 tablets (420mg ) by mouth three times daily with meals, and 1 tablet (210mg ) by mouth with snacks 04/06/19   [provider]  b complex-vitamin c-folic acid (NEPHRO-VITE) 0.8 MG TABS tablet Take 1 tablet by mouth See admin instructions. Take one tablet by mouth on Sunday, Tuesday, Thursday, Saturday mornings. Take one tablet after dialysis on Monday, Wednesday, Friday 02/06/19   [provider]  budesonide-formoterol (SYMBICORT) 160-4.5  MCG/ACT inhaler Inhale 2 puffs into the lungs 2 (two) times daily.    [provider]  buPROPion (WELLBUTRIN SR) 150 MG 12 hr tablet Take 150 mg by mouth 2 (two) times daily.    [provider]  cinacalcet (SENSIPAR) 30 MG tablet Take 1 tablet (30 mg total) by mouth every Monday, Wednesday, and Friday at 6 PM. 05/12/19   Dana Allan I, MD  clopidogrel (PLAVIX) 75 MG tablet Take 75 mg by mouth daily.    [provider]  ipratropium-albuterol (DUONEB) 0.5-2.5 (3) MG/3ML SOLN Take 3 mLs by nebulization every 6 (six) hours as needed. Patient taking differently: Take 3 mLs by nebulization every 6 (six) hours as needed (for wheezing).  05/11/19   Dana Allan I, MD  lidocaine-prilocaine (EMLA) cream Apply 1 application topically every Monday, Wednesday, and Friday. Prior to dialysis 04/06/19   [provider]  metoprolol succinate (TOPROL-XL) 25 MG 24 hr tablet Take 1 tablet (25 mg total) by mouth daily. Take with or immediately following a meal. 07/06/19   Ladona Horns, MD  OXYGEN Inhale 3  L/min into the lungs continuous.     [provider]  pantoprazole (PROTONIX) 20 MG tablet Take 1 tablet (20 mg total) by mouth daily. 07/11/19   Ladona Horns, MD  SEROQUEL 200 MG tablet Take 200 mg by mouth at bedtime. 04/05/19   [provider]  tiotropium (SPIRIVA HANDIHALER) 18 MCG inhalation capsule Place 1 capsule (18 mcg total) into inhaler and inhale daily. 05/11/19 05/10/20  Bonnell Public, MD    Family History Family History  Problem Relation Age of Onset  . Emphysema Father     Social History Social History   Tobacco Use  . Smoking status: Former Smoker    Years: 35.00    Types: Cigarettes    Quit date: 05/16/2019    Years since quitting: 0.1  . Smokeless tobacco: Never Used  . Tobacco comment: 3-4 cigarettes per day  Substance Use Topics  . Alcohol use: Never    Frequency: Never    Comment: states has quit drinking "a few months  ago"  . Drug use: Never     Allergies   Citalopram hydrobromide and Morphine and related   Review of Systems Review of Systems  Constitutional: Negative for chills and fever.  HENT: Negative for sore throat.   Eyes: Negative for redness.  Respiratory: Negative for shortness of breath.   Cardiovascular: Positive for syncope. Negative for chest pain, palpitations and leg swelling.  Gastrointestinal: Negative for abdominal pain, blood in stool, diarrhea and vomiting.  Genitourinary: Negative for flank pain.  Musculoskeletal: Negative for back pain and neck pain.  Skin: Negative for rash.  Neurological: Positive for syncope and light-headedness. Negative for headaches.  Hematological: Does not bruise/bleed easily.  Psychiatric/Behavioral: Negative for confusion.     Physical Exam Updated Vital Signs Ht 1.626 m (5\' 4" )   Wt 64.9 kg   BMI 24.55 kg/m   Physical Exam Vitals signs and nursing note reviewed.  Constitutional:      Appearance: Normal appearance. She is well-developed.  HENT:     Head: Atraumatic.     Nose: Nose normal.     Mouth/Throat:     Mouth: Mucous membranes are moist.  Eyes:     General: No scleral icterus.    Conjunctiva/sclera: Conjunctivae normal.     Pupils: Pupils are equal, round, and reactive to light.  Neck:     Musculoskeletal: Normal range of motion and neck supple. No neck rigidity or muscular tenderness.     Vascular: No carotid bruit.     Trachea: No tracheal deviation.  Cardiovascular:     Rate and Rhythm: Normal rate and regular rhythm.     Pulses: Normal pulses.     Heart sounds: Normal heart sounds. No murmur. No friction rub. No gallop.   Pulmonary:     Effort: Pulmonary effort is normal. No respiratory distress.     Breath sounds: Normal breath sounds.  Abdominal:     General: Bowel sounds are normal. There is no distension.     Palpations: Abdomen is soft. There is no mass.     Tenderness: There is no abdominal tenderness.  There is no guarding or rebound.     Hernia: No hernia is present.  Genitourinary:    Comments: No cva tenderness.  Musculoskeletal:        General: No swelling.  Skin:    General: Skin is warm and dry.     Findings: No rash.  Neurological:     Mental Status: She is alert.  Comments: Alert, speech normal. Motor intact bil, stre 5/5. sens grossly intact bil.   Psychiatric:        Mood and Affect: Mood normal.      ED Treatments / Results  Labs (all labs ordered are listed, but only abnormal results are displayed) Results for orders placed or performed during the hospital encounter of 07/19/19  CBC  Result Value Ref Range   WBC 5.6 4.0 - 10.5 K/uL   RBC 2.71 (L) 3.87 - 5.11 MIL/uL   Hemoglobin 9.3 (L) 12.0 - 15.0 g/dL   HCT 31.3 (L) 36.0 - 46.0 %   MCV 115.5 (H) 80.0 - 100.0 fL   MCH 34.3 (H) 26.0 - 34.0 pg   MCHC 29.7 (L) 30.0 - 36.0 g/dL   RDW 18.2 (H) 11.5 - 15.5 %   Platelets 224 150 - 400 K/uL   nRBC 1.3 (H) 0.0 - 0.2 %  Comprehensive metabolic panel  Result Value Ref Range   Sodium 137 135 - 145 mmol/L   Potassium 4.0 3.5 - 5.1 mmol/L   Chloride 97 (L) 98 - 111 mmol/L   CO2 20 (L) 22 - 32 mmol/L   Glucose, Bld 192 (H) 70 - 99 mg/dL   BUN 23 8 - 23 mg/dL   Creatinine, Ser 4.22 (H) 0.44 - 1.00 mg/dL   Calcium 7.9 (L) 8.9 - 10.3 mg/dL   Total Protein 6.7 6.5 - 8.1 g/dL   Albumin 3.6 3.5 - 5.0 g/dL   AST 20 15 - 41 U/L   ALT 15 0 - 44 U/L   Alkaline Phosphatase 72 38 - 126 U/L   Total Bilirubin 0.6 0.3 - 1.2 mg/dL   GFR calc non Af Amer 10 (L) >60 mL/min   GFR calc Af Amer 12 (L) >60 mL/min   Anion gap 20 (H) 5 - 15   Dg Chest 2 View  Result Date: 07/05/2019 CLINICAL DATA:  Shortness of breath and chest pain EXAM: CHEST - 2 VIEW COMPARISON:  07/02/2019 FINDINGS: Cardiac shadow is stable. Aortic calcifications are again seen. The lungs are well aerated bilaterally. No focal infiltrate or sizable effusion is seen. No bony abnormality is noted. IMPRESSION: No  acute abnormality seen. Electronically Signed   By: Inez Catalina M.D.   On: 07/05/2019 14:27   Dg Chest Port 1 View  Result Date: 07/02/2019 CLINICAL DATA:  Shortness of breath since this morning. Chest tightness. History of lung cancer. Ex-smoker. EXAM: PORTABLE CHEST 1 VIEW COMPARISON:  06/28/2019. FINDINGS: Stable enlarged cardiac silhouette and tortuous and calcified thoracic aorta. Stable surgical clips and staples on the left. Stable coronary artery stent. Stable mild prominence of the interstitial markings with normal vascularity. Stable mild elevation of the left hemidiaphragm and small amount of pleural scarring at the left lung base laterally. Interval minimal linear atelectasis at the right lung base. Diffuse osteopenia and mild scoliosis. Atheromatous arterial calcifications. IMPRESSION: 1. Interval minimal linear atelectasis at the right lung base. 2. Stable cardiomegaly and mild chronic interstitial lung disease. Electronically Signed   By: Claudie Revering M.D.   On: 07/02/2019 13:31   Dg Chest Port 1 View  Result Date: 06/28/2019 CLINICAL DATA:  Shortness of breath EXAM: PORTABLE CHEST 1 VIEW COMPARISON:  06/16/2019 FINDINGS: Cardiomegaly. Postoperative left lung with volume loss and hilar clips. Coronary stenting. Interstitial coarsening that is stable from prior. Hyperinflation with diaphragm flattening. There is no edema, consolidation, effusion, or pneumothorax. IMPRESSION: 1. No acute finding when compared to prior. 2. Cardiomegaly  and COPD. Electronically Signed   By: Monte Fantasia M.D.   On: 06/28/2019 05:09    EKG EKG Interpretation  Date/Time:  Wednesday July 19 2019 16:28:44 EDT Ventricular Rate:  84 PR Interval:    QRS Duration: 110 QT Interval:  431 QTC Calculation: 510 R Axis:   60 Text Interpretation:  Sinus rhythm Multiple ventricular premature complexes LVH with secondary repolarization abnormality Prolonged QT interval Non-specific ST-t changes No significant  change since last tracing Confirmed by Lajean Saver [phone]) on 07/19/2019 5:55:59 PM   Radiology No results found.  Procedures Procedures (including critical care time)  Medications Ordered in ED Medications - No data to display   Initial Impression / Assessment and Plan / ED Course  I have reviewed the triage vital signs and the nursing notes.  Pertinent labs & imaging results that were available during my care of the patient were reviewed by me and considered in my medical decision making (see chart for details).  Iv ns. Stat labs. Ecg. Continuous pulse ox and monitor. 250 cc ns bolus.   Reviewed nursing notes and prior charts for additional history.   ecg unchanged from prior. No cp or discomfort. Sinus rhythm.  Labs reviewed by me - hct c/w baseline. k normal.   Pt tolerating po fluids/food.   Ambulate in hall.  Recheck pt, alert, no distress, denies pain or other new c/o, feels at baseline.  Recheck again, pt states continues to feel fine, at baseline - pt denies any current complaint and requests d/c to home.   Pt currently appears stable for d/c.   Return precautions provided.     Final Clinical Impressions(s) / ED Diagnoses   Final diagnoses:  None    ED Discharge Orders    None            Lajean Saver, MD 07/19/19 1930

## 2019-07-19 NOTE — Discharge Instructions (Signed)
It was our pleasure to provide your ER care today - we hope that you feel better.  Follow up with your doctor in the coming week. Your fainting episode, near end of dialysis, often can be from a relative fluid deficit/related to dialysis, however than can also be other causes - follow up with your doctor in the coming week - discussed possible outpatient Holter or event monitor.   Return to ER right away if worse, new symptoms, fevers, chest pain, trouble breathing, fast heart beat, weak/fainting, or other concern.

## 2019-07-19 NOTE — ED Notes (Signed)
During orthostatic vitals, pt unable to stand up for three minutes.

## 2019-07-19 NOTE — Progress Notes (Signed)
Right arm fistula deaccessed done. Pressure dressing applied for 80min. No active bleeding noted. RN made aware.

## 2019-07-19 NOTE — ED Triage Notes (Signed)
Pt BIB [facility] for eval of episode of unresponsiveness at dialysis. EMS reports staff member administered 4 chest compressions w/ ROSC. EMS does report difficulty obtaining pulse on their arrival, states diaphoretic. EMS reports poss post ictal behavior on their arrival. Had 1.3L of fluid removed . Pt reports weakness or fainting is common for her during dialysis, states nothing new for her. EMS reports pt was cyanotic and unresponsive on their arrival. EMS reports they bagged her for about 2-3 minutes on scene.

## 2019-07-23 ENCOUNTER — Inpatient Hospital Stay (HOSPITAL_COMMUNITY)
Admission: EM | Admit: 2019-07-23 | Discharge: 2019-07-26 | DRG: 193 | Disposition: A | Discharge status: Skilled Nursing Facility | Payer: Medicare Other | Source: Skilled Nursing Facility | Attending: Student in an Organized Health Care Education/Training Program | Admitting: Student in an Organized Health Care Education/Training Program

## 2019-07-23 ENCOUNTER — Other Ambulatory Visit: Payer: Self-pay

## 2019-07-23 ENCOUNTER — Emergency Department (HOSPITAL_COMMUNITY): Payer: Medicare Other

## 2019-07-23 DIAGNOSIS — M48061 Spinal stenosis, lumbar region without neurogenic claudication: Secondary | ICD-10-CM | POA: Diagnosis present

## 2019-07-23 DIAGNOSIS — I5042 Chronic combined systolic (congestive) and diastolic (congestive) heart failure: Secondary | ICD-10-CM | POA: Diagnosis present

## 2019-07-23 DIAGNOSIS — F329 Major depressive disorder, single episode, unspecified: Secondary | ICD-10-CM | POA: Diagnosis present

## 2019-07-23 DIAGNOSIS — F419 Anxiety disorder, unspecified: Secondary | ICD-10-CM | POA: Diagnosis present

## 2019-07-23 DIAGNOSIS — Z7902 Long term (current) use of antithrombotics/antiplatelets: Secondary | ICD-10-CM

## 2019-07-23 DIAGNOSIS — E785 Hyperlipidemia, unspecified: Secondary | ICD-10-CM | POA: Diagnosis present

## 2019-07-23 DIAGNOSIS — Z9981 Dependence on supplemental oxygen: Secondary | ICD-10-CM

## 2019-07-23 DIAGNOSIS — Z7951 Long term (current) use of inhaled steroids: Secondary | ICD-10-CM

## 2019-07-23 DIAGNOSIS — Z902 Acquired absence of lung [part of]: Secondary | ICD-10-CM

## 2019-07-23 DIAGNOSIS — Z888 Allergy status to other drugs, medicaments and biological substances status: Secondary | ICD-10-CM

## 2019-07-23 DIAGNOSIS — Z8719 Personal history of other diseases of the digestive system: Secondary | ICD-10-CM

## 2019-07-23 DIAGNOSIS — I701 Atherosclerosis of renal artery: Secondary | ICD-10-CM | POA: Diagnosis present

## 2019-07-23 DIAGNOSIS — Z885 Allergy status to narcotic agent status: Secondary | ICD-10-CM

## 2019-07-23 DIAGNOSIS — J189 Pneumonia, unspecified organism: Principal | ICD-10-CM | POA: Diagnosis present

## 2019-07-23 DIAGNOSIS — I4891 Unspecified atrial fibrillation: Secondary | ICD-10-CM | POA: Diagnosis present

## 2019-07-23 DIAGNOSIS — R29898 Other symptoms and signs involving the musculoskeletal system: Secondary | ICD-10-CM | POA: Diagnosis present

## 2019-07-23 DIAGNOSIS — J441 Chronic obstructive pulmonary disease with (acute) exacerbation: Secondary | ICD-10-CM | POA: Diagnosis not present

## 2019-07-23 DIAGNOSIS — I132 Hypertensive heart and chronic kidney disease with heart failure and with stage 5 chronic kidney disease, or end stage renal disease: Secondary | ICD-10-CM | POA: Diagnosis present

## 2019-07-23 DIAGNOSIS — E1122 Type 2 diabetes mellitus with diabetic chronic kidney disease: Secondary | ICD-10-CM | POA: Diagnosis present

## 2019-07-23 DIAGNOSIS — Z955 Presence of coronary angioplasty implant and graft: Secondary | ICD-10-CM

## 2019-07-23 DIAGNOSIS — Z85118 Personal history of other malignant neoplasm of bronchus and lung: Secondary | ICD-10-CM

## 2019-07-23 DIAGNOSIS — J9611 Chronic respiratory failure with hypoxia: Secondary | ICD-10-CM | POA: Diagnosis present

## 2019-07-23 DIAGNOSIS — Y95 Nosocomial condition: Secondary | ICD-10-CM | POA: Diagnosis present

## 2019-07-23 DIAGNOSIS — R0600 Dyspnea, unspecified: Secondary | ICD-10-CM | POA: Diagnosis present

## 2019-07-23 DIAGNOSIS — J449 Chronic obstructive pulmonary disease, unspecified: Secondary | ICD-10-CM | POA: Diagnosis present

## 2019-07-23 DIAGNOSIS — N2581 Secondary hyperparathyroidism of renal origin: Secondary | ICD-10-CM | POA: Diagnosis present

## 2019-07-23 DIAGNOSIS — I252 Old myocardial infarction: Secondary | ICD-10-CM

## 2019-07-23 DIAGNOSIS — R262 Difficulty in walking, not elsewhere classified: Secondary | ICD-10-CM | POA: Diagnosis present

## 2019-07-23 DIAGNOSIS — Z20828 Contact with and (suspected) exposure to other viral communicable diseases: Secondary | ICD-10-CM | POA: Diagnosis present

## 2019-07-23 DIAGNOSIS — Z992 Dependence on renal dialysis: Secondary | ICD-10-CM

## 2019-07-23 DIAGNOSIS — Z87891 Personal history of nicotine dependence: Secondary | ICD-10-CM

## 2019-07-23 DIAGNOSIS — Z79899 Other long term (current) drug therapy: Secondary | ICD-10-CM

## 2019-07-23 DIAGNOSIS — E8889 Other specified metabolic disorders: Secondary | ICD-10-CM | POA: Diagnosis present

## 2019-07-23 DIAGNOSIS — J440 Chronic obstructive pulmonary disease with acute lower respiratory infection: Secondary | ICD-10-CM | POA: Diagnosis present

## 2019-07-23 DIAGNOSIS — E7800 Pure hypercholesterolemia, unspecified: Secondary | ICD-10-CM | POA: Diagnosis present

## 2019-07-23 DIAGNOSIS — I2510 Atherosclerotic heart disease of native coronary artery without angina pectoris: Secondary | ICD-10-CM | POA: Diagnosis present

## 2019-07-23 DIAGNOSIS — Z7982 Long term (current) use of aspirin: Secondary | ICD-10-CM

## 2019-07-23 DIAGNOSIS — F1011 Alcohol abuse, in remission: Secondary | ICD-10-CM | POA: Diagnosis present

## 2019-07-23 DIAGNOSIS — Z825 Family history of asthma and other chronic lower respiratory diseases: Secondary | ICD-10-CM

## 2019-07-23 DIAGNOSIS — Z9115 Patient's noncompliance with renal dialysis: Secondary | ICD-10-CM

## 2019-07-23 DIAGNOSIS — D631 Anemia in chronic kidney disease: Secondary | ICD-10-CM | POA: Diagnosis present

## 2019-07-23 DIAGNOSIS — N186 End stage renal disease: Secondary | ICD-10-CM

## 2019-07-23 LAB — TROPONIN I (HIGH SENSITIVITY)
Troponin I (High Sensitivity): 62 ng/L — ABNORMAL HIGH (ref <18)
Troponin I (High Sensitivity): 70 ng/L — ABNORMAL HIGH (ref <18)

## 2019-07-23 LAB — CBC WITH DIFFERENTIAL/PLATELET
Abs Immature Granulocytes: 0.07 10*3/uL (ref 0.00–0.07)
Basophils Absolute: 0 10*3/uL (ref 0.0–0.1)
Basophils Relative: 0 %
Eosinophils Absolute: 0.1 10*3/uL (ref 0.0–0.5)
Eosinophils Relative: 1 %
HCT: 30.4 % — ABNORMAL LOW (ref 36.0–46.0)
Hemoglobin: 9.2 g/dL — ABNORMAL LOW (ref 12.0–15.0)
Immature Granulocytes: 1 %
Lymphocytes Relative: 5 %
Lymphs Abs: 0.4 10*3/uL — ABNORMAL LOW (ref 0.7–4.0)
MCH: 34.1 pg — ABNORMAL HIGH (ref 26.0–34.0)
MCHC: 30.3 g/dL (ref 30.0–36.0)
MCV: 112.6 fL — ABNORMAL HIGH (ref 80.0–100.0)
Monocytes Absolute: 0.5 10*3/uL (ref 0.1–1.0)
Monocytes Relative: 6 %
Neutro Abs: 7.4 10*3/uL (ref 1.7–7.7)
Neutrophils Relative %: 87 %
Platelets: 228 10*3/uL (ref 150–400)
RBC: 2.7 MIL/uL — ABNORMAL LOW (ref 3.87–5.11)
RDW: 19.2 % — ABNORMAL HIGH (ref 11.5–15.5)
WBC: 8.5 10*3/uL (ref 4.0–10.5)
nRBC: 0.4 % — ABNORMAL HIGH (ref 0.0–0.2)

## 2019-07-23 LAB — COMPREHENSIVE METABOLIC PANEL
ALT: 13 U/L (ref 0–44)
AST: 16 U/L (ref 15–41)
Albumin: 3.6 g/dL (ref 3.5–5.0)
Alkaline Phosphatase: 76 U/L (ref 38–126)
Anion gap: 17 — ABNORMAL HIGH (ref 5–15)
BUN: 60 mg/dL — ABNORMAL HIGH (ref 8–23)
CO2: 24 mmol/L (ref 22–32)
Calcium: 8.6 mg/dL — ABNORMAL LOW (ref 8.9–10.3)
Chloride: 97 mmol/L — ABNORMAL LOW (ref 98–111)
Creatinine, Ser: 8.33 mg/dL — ABNORMAL HIGH (ref 0.44–1.00)
GFR calc Af Amer: 5 mL/min — ABNORMAL LOW (ref >60)
GFR calc non Af Amer: 4 mL/min — ABNORMAL LOW (ref >60)
Glucose, Bld: 139 mg/dL — ABNORMAL HIGH (ref 70–99)
Potassium: 4.5 mmol/L (ref 3.5–5.1)
Sodium: 138 mmol/L (ref 135–145)
Total Bilirubin: 0.6 mg/dL (ref 0.3–1.2)
Total Protein: 6.4 g/dL — ABNORMAL LOW (ref 6.5–8.1)

## 2019-07-23 LAB — LIPASE, BLOOD: Lipase: 27 U/L (ref 11–51)

## 2019-07-23 LAB — BRAIN NATRIURETIC PEPTIDE: B Natriuretic Peptide: 4286.8 pg/mL — ABNORMAL HIGH (ref 0.0–100.0)

## 2019-07-23 LAB — PROTIME-INR
INR: 1.1 (ref 0.8–1.2)
Prothrombin Time: 14.5 seconds (ref 11.4–15.2)

## 2019-07-23 MED ORDER — FERRIC CITRATE 1 GM 210 MG(FE) PO TABS
420.0000 mg | ORAL_TABLET | Freq: Three times a day (TID) | ORAL | Status: DC
  Administered 2019-07-24 – 2019-07-26 (×7): 420 mg via ORAL
  Filled 2019-07-23 (×7): qty 2

## 2019-07-23 MED ORDER — ATORVASTATIN CALCIUM 40 MG PO TABS
40.0000 mg | ORAL_TABLET | Freq: Every day | ORAL | Status: DC
  Administered 2019-07-24 – 2019-07-26 (×3): 40 mg via ORAL
  Filled 2019-07-23 (×3): qty 1

## 2019-07-23 MED ORDER — QUETIAPINE FUMARATE 50 MG PO TABS
200.0000 mg | ORAL_TABLET | Freq: Every day | ORAL | Status: DC
  Administered 2019-07-24 – 2019-07-25 (×3): 200 mg via ORAL
  Filled 2019-07-23: qty 1
  Filled 2019-07-23 (×2): qty 4

## 2019-07-23 MED ORDER — LEVOFLOXACIN IN D5W 750 MG/150ML IV SOLN: 750.0000 mg | Freq: Once | INTRAVENOUS | Status: DC

## 2019-07-23 MED ORDER — HYDROCODONE-ACETAMINOPHEN 5-325 MG PO TABS
2.0000 | ORAL_TABLET | Freq: Once | ORAL | Status: AC
  Administered 2019-07-23: 2 via ORAL
  Filled 2019-07-23: qty 2

## 2019-07-23 MED ORDER — HEPARIN SODIUM (PORCINE) 5000 UNIT/ML IJ SOLN: 5000.0000 [IU] | Freq: Three times a day (TID) | INTRAMUSCULAR | Status: DC

## 2019-07-23 MED ORDER — ASPIRIN EC 81 MG PO TBEC
81.0000 mg | DELAYED_RELEASE_TABLET | Freq: Every day | ORAL | Status: DC
  Administered 2019-07-24 – 2019-07-26 (×3): 81 mg via ORAL
  Filled 2019-07-23 (×3): qty 1

## 2019-07-23 MED ORDER — BUPROPION HCL ER (SR) 150 MG PO TB12
150.0000 mg | ORAL_TABLET | ORAL | Status: DC
  Administered 2019-07-24 – 2019-07-26 (×4): 150 mg via ORAL
  Filled 2019-07-23 (×6): qty 1

## 2019-07-23 MED ORDER — METHYLPREDNISOLONE SODIUM SUCC 125 MG IJ SOLR: 125.0000 mg | Freq: Once | INTRAMUSCULAR | Status: DC

## 2019-07-23 MED ORDER — PANTOPRAZOLE SODIUM 20 MG PO TBEC
20.0000 mg | DELAYED_RELEASE_TABLET | Freq: Every day | ORAL | Status: DC
  Administered 2019-07-24 – 2019-07-26 (×3): 20 mg via ORAL
  Filled 2019-07-23 (×3): qty 1

## 2019-07-23 MED ORDER — METOPROLOL SUCCINATE ER 25 MG PO TB24
25.0000 mg | ORAL_TABLET | Freq: Every day | ORAL | Status: DC
  Administered 2019-07-24 – 2019-07-26 (×3): 25 mg via ORAL
  Filled 2019-07-23 (×3): qty 1

## 2019-07-23 MED ORDER — CLOPIDOGREL BISULFATE 75 MG PO TABS
75.0000 mg | ORAL_TABLET | Freq: Every day | ORAL | Status: DC
  Administered 2019-07-24 – 2019-07-26 (×3): 75 mg via ORAL
  Filled 2019-07-23 (×3): qty 1

## 2019-07-23 MED ORDER — ALBUTEROL SULFATE HFA 108 (90 BASE) MCG/ACT IN AERS
2.0000 | INHALATION_SPRAY | RESPIRATORY_TRACT | Status: DC | PRN
  Administered 2019-07-23: 2 via RESPIRATORY_TRACT
  Filled 2019-07-23: qty 6.7

## 2019-07-23 MED ORDER — CINACALCET HCL 30 MG PO TABS
30.0000 mg | ORAL_TABLET | ORAL | Status: DC
  Administered 2019-07-24: 30 mg via ORAL
  Filled 2019-07-23 (×2): qty 1

## 2019-07-23 MED ORDER — RENA-VITE PO TABS
1.0000 | ORAL_TABLET | Freq: Every day | ORAL | Status: DC
  Administered 2019-07-24 – 2019-07-25 (×3): 1 via ORAL
  Filled 2019-07-23 (×3): qty 1

## 2019-07-23 NOTE — ED Provider Notes (Signed)
[facility] EMERGENCY DEPARTMENT Provider Note   CSN: [id number] Arrival date & time: 07/23/19  1635     History   Chief Complaint Chief Complaint  Patient presents with  . Anxiety    HPI Kari Robinson is a 68 y.o. female.     HPI Patient reports that she became short of breath pretty rapidly this afternoon.  She reports her morning was going all right and then she thought that she needed her inhaler.  She reports she had asked multiple times at her assisted living to get treatment.  She reports finally by the time she got it, it did not help and then she felt much more short of breath.  She denies he is had a fever or productive cough.  No swelling in the legs.  She reports she has a achy pain in the front of her left chest like she got "hit".  She reports it is worse if she pushes on it.  She thinks is because she had the be aggressively resuscitated and dialysis a couple weeks ago and reportedly they were pushing on her chest.  Patient reports that she is taken her regular a.m. medications without missed doses. Past Medical History:  Diagnosis Date  . Alcohol abuse   . Arthralgia   . CAD (coronary artery disease)   . Chronic combined systolic and diastolic HF (heart failure) ([place])   . COPD (chronic obstructive pulmonary disease) ([place])   . Depression   . Dyspnea   . ESRD (end stage renal disease) ([place])   . HTN (hypertension)   . Hypercholesterolemia    primarily ldl-p and small particles  . Lung cancer [facility])    Patient reports this with surgery.  No details.    [name] RAS (renal artery stenosis) ([place]) 2002   by cath tysinger  . Smoking     Patient Active Problem List   Diagnosis Date Noted  . Community acquired pneumonia 07/23/2019  . Dyspnea 07/02/2019  . Respiratory failure ([place]) 06/28/2019  . Chronic respiratory failure with hypoxia ([place])   . Community acquired pneumonia of right lower lobe of lung ([place])   . COPD with acute exacerbation ([place])  06/16/2019  . COPD exacerbation ([place]) 05/09/2019  . Leukocytosis 05/09/2019  . Lumbar stenosis 05/09/2019  . Fall 04/01/2019  . Leg weakness, bilateral 04/01/2019  . Tobacco abuse 04/01/2019  . Alcohol abuse 04/01/2019  . Transient loss of consciousness 02/24/2019  . ESRD (end stage renal disease) on dialysis ([place]) 02/24/2019  . Seizure-like activity ([place]) 02/24/2019  . NSTEMI (non-ST elevated myocardial infarction) ([place]) 02/24/2019  . Ischemic cardiomyopathy 02/24/2019  . Hypertensive emergency 09/30/2018  . Type II diabetes mellitus with renal manifestations ([place]) 09/30/2018  . HLD (hyperlipidemia) 09/30/2018  . Depression with anxiety 09/30/2018  . CKD (chronic kidney disease), stage V ([place]) 09/30/2018  . Acute on chronic respiratory failure with hypoxia ([place]) 09/30/2018  . Fluid overload 09/30/2018  . CAD (coronary artery disease) 09/30/2018  . Anemia of chronic disease 09/30/2018  . COPD (chronic obstructive pulmonary disease) ([place])   . Acute on chronic combined systolic and diastolic CHF (congestive heart failure) ([place])   . Solitary right kidney 06/24/2018  . Chronic systolic CHF (congestive heart failure) ([place]) 06/24/2018  . Hypertensive emergency 06/07/2018  . CKD (chronic kidney disease) stage 5, GFR less than 15 ml/min ([name]) 06/07/2018  . Normocytic normochromic anemia 06/07/2018  . DM (diabetes mellitus), type 2 with renal complications ([place]) 30/16/0109  . Hypertensive urgency  06/07/2018  . Occlusion and stenosis of carotid artery without mention of cerebral infarction 01/19/2012  . RENAL ARTERY STENOSIS 09/10/2010  . ABDOMINAL BRUIT 08/12/2010  . DEPRESSION 08/11/2010  . PERIPHERAL VASCULAR DISEASE 08/11/2010  . Essential hypertension 08/08/2010  . Coronary atherosclerosis 08/08/2010  . ARTHRALGIA 08/08/2010    Past Surgical History:  Procedure Laterality Date  . abdominal aortogram     perclose of the right femoral artery  . AV FISTULA PLACEMENT Right  07/11/2018   Procedure: Right arm ARTERIOVENOUS FISTULA CREATION;  Surgeon: [name], MD;  Location: [facility];  Service: Vascular;  Laterality: Right;  . AVF placement Right   . CARDIAC CATHETERIZATION     left heart catheterization.  Coronary cineangiography. Lft ventricular cineangiography.    [name] LUNG SURGERY     due to lung cancer per patient's report  . TUBAL LIGATION       OB History   No obstetric history on file.      Home Medications    Prior to Admission medications   Medication Sig Start Date End Date Taking? Authorizing Provider  albuterol (PROVENTIL HFA;VENTOLIN HFA) 108 (90 Base) MCG/ACT inhaler Inhale 2 puffs into the lungs every 4 (four) hours as needed for wheezing or shortness of breath.  11/25/17  Yes [provider]  aspirin EC 81 MG tablet Take 81 mg by mouth daily.   Yes [provider]  atorvastatin (LIPITOR) 40 MG tablet Take 40 mg by mouth daily.   Yes [provider]  AURYXIA 1 GM 210 MG(Fe) tablet Take 210-420 mg by mouth See admin instructions. Take 2 tablets (420 mg) by mouth three times daily with meals and 1 tablet (210 mg) by mouth with each snack 04/06/19  Yes [provider]  b complex-vitamin c-folic acid (NEPHRO-VITE) 0.8 MG TABS tablet Take 1 tablet by mouth See admin instructions. Take 1 tablet by mouth in the morning on Sun/Tues/Thurs/Sat and 1 tablet after dialysis on Mon/Wed/Fri 02/06/19  Yes [provider]  buPROPion (WELLBUTRIN SR) 150 MG 12 hr tablet Take 150 mg by mouth 2 (two) times daily.   Yes [provider]  cinacalcet (SENSIPAR) 30 MG tablet Take 1 tablet (30 mg total) by mouth every Monday, Wednesday, and Friday at 6 PM. 05/12/19  Yes Ogbata, Babs Bertin, MD  clopidogrel (PLAVIX) 75 MG tablet Take 75 mg by mouth daily.   Yes [provider]  Fluticasone-Salmeterol (ADVAIR) 500-50 MCG/DOSE AEPB Inhale 1 puff into the lungs 2 (two) times daily.   Yes [provider]   ipratropium-albuterol (DUONEB) 0.5-2.5 (3) MG/3ML SOLN Take 3 mLs by nebulization every 6 (six) hours as needed. Patient taking differently: Take 3 mLs by nebulization every 6 (six) hours as needed (for wheezing or shortness of breath).  05/11/19  Yes Dana Allan I, MD  lidocaine-prilocaine (EMLA) cream Apply 1 application topically every Monday, Wednesday, and Friday with hemodialysis. Prior to dialysis 04/06/19  Yes [provider]  metoprolol succinate (TOPROL-XL) 25 MG 24 hr tablet Take 1 tablet (25 mg total) by mouth daily. Take with or immediately following a meal. 07/06/19  Yes Ladona Horns, MD  Nutritional Supplements (PROMOD) LIQD Take 30 mLs by mouth 2 (two) times daily.   Yes [provider]  OXYGEN Inhale 3 L/min into the lungs continuous.    Yes [provider]  pantoprazole (PROTONIX) 20 MG tablet Take 1 tablet (20 mg total) by mouth daily. 07/11/19  Yes Ladona Horns, MD  QUEtiapine (SEROQUEL)  100 MG tablet Take 200 mg by mouth at bedtime.   Yes [provider]  tiotropium (SPIRIVA HANDIHALER) 18 MCG inhalation capsule Place 1 capsule (18 mcg total) into inhaler and inhale daily. 05/11/19 05/10/20 Yes Bonnell Public, MD  Amino Acids-Protein Hydrolys (FEEDING SUPPLEMENT, PRO-STAT SUGAR FREE 64,) LIQD Take 30 mLs by mouth 2 (two) times daily between meals. Patient not taking: Reported on 07/23/2019 07/11/19   Ladona Horns, MD  budesonide-formoterol [facility]) 160-4.5 MCG/ACT inhaler Inhale 2 puffs into the lungs 2 (two) times daily.    [provider]    Family History Family History  Problem Relation Age of Onset  . Emphysema Father     Social History Social History   Tobacco Use  . Smoking status: Former Smoker    Years: 35.00    Types: Cigarettes    Quit date: 05/16/2019    Years since quitting: 0.1  . Smokeless tobacco: Never Used  . Tobacco comment: 3-4 cigarettes per day  Substance Use Topics  . Alcohol use: Never     Frequency: Never    Comment: states has quit drinking "a few months ago"  . Drug use: Never     Allergies   Citalopram hydrobromide and Morphine and related   Review of Systems Review of Systems 07/23/2019 10 Systems reviewed and are negative for acute change except as noted in the HPI.  Physical Exam Updated Vital Signs BP 115/84 (BP Location: Right Arm)   Pulse 67   Temp 98.3 F (36.8 C) (Oral)   Resp 20   Ht 5\' 4"  (1.626 m)   Wt 65.8 kg   SpO2 94%   BMI 24.89 kg/m   Physical Exam Constitutional:      Comments: Patient is alert and nontoxic.  Mental status clear.  Mild tachypnea.  HENT:     Head: Normocephalic and atraumatic.  Eyes:     Extraocular Movements: Extraocular movements intact.  Neck:     Musculoskeletal: Neck supple.  Cardiovascular:     Comments: Borderline tachycardia.  S3 gallop. Pulmonary:     Comments: Mild increased work of breathing with tachypnea.  Breath sounds are very soft at the bases.  Occasional expiratory wheeze. Abdominal:     General: There is no distension.     Palpations: Abdomen is soft.     Tenderness: There is no abdominal tenderness. There is no guarding.     Comments: Multiple bruises of various ages on the low abdomen.  Appear consistent with subcutaneous injections.  Nontender without erythema or induration.  Condition of groin folds good without rash.  Musculoskeletal: Normal range of motion.     Right lower leg: No edema.     Left lower leg: No edema.     Comments: No peripheral edema.  Calves are nontender.  Skin:    General: Skin is warm and dry.  Neurological:     General: No focal deficit present.     Mental Status: She is oriented to person, place, and time.     Coordination: Coordination normal.  Psychiatric:        Mood and Affect: Mood normal.      ED Treatments / Results  Labs (all labs ordered are listed, but only abnormal results are displayed) Labs Reviewed  COMPREHENSIVE METABOLIC PANEL - Abnormal;  Notable for the following components:      Result Value   Chloride 97 (*)    Glucose, Bld 139 (*)    BUN 60 (*)  Creatinine, Ser 8.33 (*)    Calcium 8.6 (*)    Total Protein 6.4 (*)    GFR calc non Af Amer 4 (*)    GFR calc Af Amer 5 (*)    Anion gap 17 (*)    All other components within normal limits  CBC WITH DIFFERENTIAL/PLATELET - Abnormal; Notable for the following components:   RBC 2.70 (*)    Hemoglobin 9.2 (*)    HCT 30.4 (*)    MCV 112.6 (*)    MCH 34.1 (*)    RDW 19.2 (*)    nRBC 0.4 (*)    Lymphs Abs 0.4 (*)    All other components within normal limits  BRAIN NATRIURETIC PEPTIDE - Abnormal; Notable for the following components:   B Natriuretic Peptide 4,286.8 (*)    All other components within normal limits  TROPONIN I (HIGH SENSITIVITY) - Abnormal; Notable for the following components:   Troponin I (High Sensitivity) 62 (*)    All other components within normal limits  TROPONIN I (HIGH SENSITIVITY) - Abnormal; Notable for the following components:   Troponin I (High Sensitivity) 70 (*)    All other components within normal limits  SARS CORONAVIRUS 2 (HOSPITAL ORDER, [place] [facility])  LIPASE, BLOOD  PROTIME-INR  COMPREHENSIVE METABOLIC PANEL  CBC  HIV ANTIBODY (ROUTINE TESTING W REFLEX)    EKG EKG Interpretation  Date/Time:  Sunday July 23 2019 16:50:18 EDT Ventricular Rate:  109 PR Interval:    QRS Duration: 103 QT Interval:  321 QTC Calculation: 433 R Axis:   80 Text Interpretation:  Sinus tachycardia Repol abnrm, severe global ischemia (LM/MVD) agree. similar to previous. Confirmed by Charlesetta Shanks [phone]) on 07/23/2019 5:36:37 PM   Radiology Dg Chest 2 View  Result Date: 07/23/2019 CLINICAL DATA:  Pt complains of SOB. Hx COPD, HTN, and diabetes. Past smoker, quit in 2014. EXAM: CHEST - 2 VIEW COMPARISON:  Chest radiograph 07/05/2019 FINDINGS: Stable cardiomediastinal contours. Aortic arch calcifications. There are new  heterogeneous opacities in the right lung base suspicious for infection. The left lung is clear. No pneumothorax or large pleural effusion. No acute finding in the visualized skeleton. IMPRESSION: New right basilar opacities suspicious for infection or aspiration. Electronically Signed   By: Audie Pinto M.D.   On: 07/23/2019 18:32    Procedures Procedures (including critical care time)  Medications Ordered in ED Medications  aspirin EC tablet 81 mg (has no administration in time range)  atorvastatin (LIPITOR) tablet 40 mg (has no administration in time range)  metoprolol succinate (TOPROL-XL) 24 hr tablet 25 mg (has no administration in time range)  buPROPion (WELLBUTRIN SR) 12 hr tablet 150 mg (has no administration in time range)  QUEtiapine (SEROQUEL) tablet 200 mg (has no administration in time range)  cinacalcet (SENSIPAR) tablet 30 mg (has no administration in time range)  ferric citrate (AURYXIA) tablet 210-420 mg (has no administration in time range)  pantoprazole (PROTONIX) EC tablet 20 mg (has no administration in time range)  clopidogrel (PLAVIX) tablet 75 mg (has no administration in time range)  multivitamin (RENA-VIT) tablet 1 tablet (has no administration in time range)  heparin injection 5,000 Units (has no administration in time range)  cefTRIAXone (ROCEPHIN) 1 g in sodium chloride 0.9 % 100 mL IVPB (has no administration in time range)  azithromycin (ZITHROMAX) tablet 500 mg (has no administration in time range)  ipratropium-albuterol (DUONEB) 0.5-2.5 (3) MG/3ML nebulizer solution 3 mL (has no administration in time range)  HYDROcodone-acetaminophen (NORCO/VICODIN) 5-325 MG per tablet 2 tablet (2 tablets Oral Given 07/23/19 2259)     Initial Impression / Assessment and Plan / ED Course  I have reviewed the triage vital signs and the nursing notes.  Pertinent labs & imaging results that were available during my care of the patient were reviewed by me and considered  in my medical decision making (see chart for details).         Consult: Internal medicine teaching service  Patient presents with increased shortness of breath.  Chest x-ray shows a focal area of new or consolidation.  Patient does have significant comorbid illness.  Will be started for treatment of COPD exacerbation with pneumonia. Final Clinical Impressions(s) / ED Diagnoses   Final diagnoses:  COPD exacerbation ([place])  HCAP (healthcare-associated pneumonia)    ED Discharge Orders    None       Charlesetta Shanks, MD 07/24/19 [phone]

## 2019-07-23 NOTE — H&P (Signed)
Date: 07/24/2019               Patient Name:  Kari Robinson MRN: [id number]  DOB: 10-Apr-1951 Age / Sex: 68 y.o., female   PCP: Tsosie Billing, MD (Inactive)         Medical Service: Internal Medicine Teaching Service         Attending Physician: Dr. Evette Doffing, Mallie Mussel, *    First Contact: Dr. Sheppard Coil Pager: [phone]  Second Contact: Dr. Berline Lopes Pager: [phone]       After Hours (After 5p/  First Contact Pager: [phone]  weekends / holidays): Second Contact Pager: [phone]   Chief Complaint: Dyspnea  History of Present Illness:  Kari Robinson is a 69 year old female with a pertinent past medical history cancer in left lower lobectomy in 2017, COPD, ESRD on HD (MWF), hypertension, hyperlipidemia, STEMI with stent placement, A. fib with RVR, HFrEF (EF 40% on 02/25/19) who presented to the hospital from [facility] with shortness of breath. Around 2pm today she noticed that she was having more difficulty breathing. She asked for her nebulizer but nobody ever brought it and by then her breathing had gotten really bad. She denies fever, endorses some chest tenderness but no chest pain. She has been coughing more than usual which is productive for clear phlegm. She is normally uses 3L and is on 3L at 100%. She denies dizziness but has had some cramps and nausea. Her BM have been normal. Her last had dialysis was  Friday and is supposed to go tomorrow.  She was recently just discharged on 07/12/2019 due to volume overload and dyspnea.  Social: Patient states that she just recently quit smoking.  Has not drank in over a year. Lives at home with a significant other and feels safe.  Family History:  Family History  Problem Relation Age of Onset  . Emphysema Father   . Cancer Brother      Meds:  Current Meds  Medication Sig  . albuterol (PROVENTIL HFA;VENTOLIN HFA) 108 (90 Base) MCG/ACT inhaler Inhale 2 puffs into the lungs every 4 (four) hours as needed for wheezing  or shortness of breath.   [name] aspirin EC 81 MG tablet Take 81 mg by mouth daily.  [name] atorvastatin (LIPITOR) 40 MG tablet Take 40 mg by mouth daily.  Lorin Picket 1 GM 210 MG(Fe) tablet Take 210-420 mg by mouth See admin instructions. Take 2 tablets (420 mg) by mouth three times daily with meals and 1 tablet (210 mg) by mouth with each snack  . b complex-vitamin c-folic acid (NEPHRO-VITE) 0.8 MG TABS tablet Take 1 tablet by mouth See admin instructions. Take 1 tablet by mouth in the morning on Sun/Tues/Thurs/Sat and 1 tablet after dialysis on Mon/Wed/Fri  . buPROPion (WELLBUTRIN SR) 150 MG 12 hr tablet Take 150 mg by mouth 2 (two) times daily.  . cinacalcet (SENSIPAR) 30 MG tablet Take 1 tablet (30 mg total) by mouth every Monday, Wednesday, and Friday at 6 PM.  . clopidogrel (PLAVIX) 75 MG tablet Take 75 mg by mouth daily.  . Fluticasone-Salmeterol (ADVAIR) 500-50 MCG/DOSE AEPB Inhale 1 puff into the lungs 2 (two) times daily.  [name] ipratropium-albuterol (DUONEB) 0.5-2.5 (3) MG/3ML SOLN Take 3 mLs by nebulization every 6 (six) hours as needed. (Patient taking differently: Take 3 mLs by nebulization every 6 (six) hours as needed (for wheezing or shortness of breath). )  . lidocaine-prilocaine (EMLA) cream Apply 1 application topically every Monday, Wednesday, and Friday  with hemodialysis. Prior to dialysis  . metoprolol succinate (TOPROL-XL) 25 MG 24 hr tablet Take 1 tablet (25 mg total) by mouth daily. Take with or immediately following a meal.  . Nutritional Supplements (PROMOD) LIQD Take 30 mLs by mouth 2 (two) times daily.  . OXYGEN Inhale 3 L/min into the lungs continuous.   . pantoprazole (PROTONIX) 20 MG tablet Take 1 tablet (20 mg total) by mouth daily.  . QUEtiapine (SEROQUEL) 100 MG tablet Take 200 mg by mouth at bedtime.  [name] tiotropium (SPIRIVA HANDIHALER) 18 MCG inhalation capsule Place 1 capsule (18 mcg total) into inhaler and inhale daily.     Allergies: Allergies as of 07/23/2019 - Review  Complete 07/23/2019  Allergen Reaction Noted  . Citalopram hydrobromide Hives   . Morphine and related Itching 09/30/2018   Past Medical History:  Diagnosis Date  . Alcohol abuse   . Arthralgia   . CAD (coronary artery disease)   . Chronic combined systolic and diastolic HF (heart failure) ([place])   . COPD (chronic obstructive pulmonary disease) ([place])   . Depression   . Dyspnea   . ESRD (end stage renal disease) ([place])   . HTN (hypertension)   . Hypercholesterolemia    primarily ldl-p and small particles  . Lung cancer [facility])    Patient reports this with surgery.  No details.    [name] RAS (renal artery stenosis) ([place]) 2002   by cath tysinger  . Smoking      Review of Systems: A complete ROS was negative except as per HPI.   Physical Exam: Blood pressure 126/81, pulse 85, temperature 98.3 F (36.8 C), temperature source Oral, resp. rate (!) 27, height 5\' 4"  (1.626 m), weight 65.8 kg, SpO2 100 %. Physical Exam  Constitutional: She is oriented to person, place, and time. No distress.  Eyes: EOM are normal.  Neck: No tracheal deviation present.  Cardiovascular: Normal rate, regular rhythm, normal heart sounds and intact distal pulses. Exam reveals no gallop and no friction rub.  No murmur heard. Pulmonary/Chest: Effort normal and breath sounds normal. No stridor. No respiratory distress. She has no wheezes. She has no rales. She exhibits tenderness.  Abdominal: Soft. There is no abdominal tenderness.  Musculoskeletal:        General: No edema.  Neurological: She is alert and oriented to person, place, and time.  Skin: Skin is warm and dry.  Psychiatric: Affect normal.    EKG: personally reviewed my interpretation is unchanged from previous EKG.  CXR: personally reviewed my interpretation is new right basilar opacities suspicious for infection or aspiration  Assessment & Plan by Problem: Active Problems:   CKD (chronic kidney disease) stage 5, GFR less than 15 ml/min ([name])    DM (diabetes mellitus), type 2 with renal complications ([name])   COPD (chronic obstructive pulmonary disease) ([name])   Hospital-acquired pneumonia  Plan:  HAP - patients history and chest xray are concerning for pneumonia  - Cefepime  - Duonebs 3 times daily - Ordered blood cultures  ESRD  - She will need nephrology consult for HD tomorrow.   COPD:  Gold criteria C, Grade 2. Ratio 66%, FEV1 61, FVC 71 with mild restriction. Patient has had at least 3 hospitalizations of COPD exacerbation in 2020. - Ordered Breo Ellipta  - Not sure which medications she is taking. We may need to contact [place] to determine what she is taking and if she needs to change her medications.   Diet: Renal VTE: Lovenox IVF:  none Code: Limited resuscitation.  Patient does not want endotracheal tube placement.  Dispo: Admit patient to Observation with expected length of stay less than 2 midnights.  Signed: Marianna Payment, MD 07/24/2019, 4:08 AM  Pager: [phone]

## 2019-07-23 NOTE — ED Triage Notes (Signed)
Per EMS pt from [place] with anxiety and SOB started today. Dialysis M/W/F, usually non compliant, does not finish the full treatment

## 2019-07-24 ENCOUNTER — Other Ambulatory Visit: Payer: Self-pay

## 2019-07-24 ENCOUNTER — Encounter (HOSPITAL_COMMUNITY): Payer: Self-pay | Admitting: *Deleted

## 2019-07-24 DIAGNOSIS — J181 Lobar pneumonia, unspecified organism: Secondary | ICD-10-CM

## 2019-07-24 DIAGNOSIS — Z20828 Contact with and (suspected) exposure to other viral communicable diseases: Secondary | ICD-10-CM | POA: Diagnosis present

## 2019-07-24 DIAGNOSIS — I4891 Unspecified atrial fibrillation: Secondary | ICD-10-CM | POA: Diagnosis present

## 2019-07-24 DIAGNOSIS — R262 Difficulty in walking, not elsewhere classified: Secondary | ICD-10-CM | POA: Diagnosis present

## 2019-07-24 DIAGNOSIS — J441 Chronic obstructive pulmonary disease with (acute) exacerbation: Secondary | ICD-10-CM | POA: Diagnosis present

## 2019-07-24 DIAGNOSIS — F329 Major depressive disorder, single episode, unspecified: Secondary | ICD-10-CM | POA: Diagnosis present

## 2019-07-24 DIAGNOSIS — Z85118 Personal history of other malignant neoplasm of bronchus and lung: Secondary | ICD-10-CM

## 2019-07-24 DIAGNOSIS — I132 Hypertensive heart and chronic kidney disease with heart failure and with stage 5 chronic kidney disease, or end stage renal disease: Secondary | ICD-10-CM | POA: Diagnosis present

## 2019-07-24 DIAGNOSIS — M48061 Spinal stenosis, lumbar region without neurogenic claudication: Secondary | ICD-10-CM | POA: Diagnosis present

## 2019-07-24 DIAGNOSIS — J440 Chronic obstructive pulmonary disease with acute lower respiratory infection: Secondary | ICD-10-CM | POA: Diagnosis present

## 2019-07-24 DIAGNOSIS — Z992 Dependence on renal dialysis: Secondary | ICD-10-CM

## 2019-07-24 DIAGNOSIS — E7800 Pure hypercholesterolemia, unspecified: Secondary | ICD-10-CM | POA: Diagnosis present

## 2019-07-24 DIAGNOSIS — R0600 Dyspnea, unspecified: Secondary | ICD-10-CM | POA: Diagnosis present

## 2019-07-24 DIAGNOSIS — R2681 Unsteadiness on feet: Secondary | ICD-10-CM

## 2019-07-24 DIAGNOSIS — J9611 Chronic respiratory failure with hypoxia: Secondary | ICD-10-CM | POA: Diagnosis present

## 2019-07-24 DIAGNOSIS — Z9981 Dependence on supplemental oxygen: Secondary | ICD-10-CM

## 2019-07-24 DIAGNOSIS — I5020 Unspecified systolic (congestive) heart failure: Secondary | ICD-10-CM | POA: Diagnosis not present

## 2019-07-24 DIAGNOSIS — Z9115 Patient's noncompliance with renal dialysis: Secondary | ICD-10-CM | POA: Diagnosis not present

## 2019-07-24 DIAGNOSIS — Z902 Acquired absence of lung [part of]: Secondary | ICD-10-CM

## 2019-07-24 DIAGNOSIS — E8889 Other specified metabolic disorders: Secondary | ICD-10-CM | POA: Diagnosis present

## 2019-07-24 DIAGNOSIS — F1011 Alcohol abuse, in remission: Secondary | ICD-10-CM | POA: Diagnosis present

## 2019-07-24 DIAGNOSIS — E1122 Type 2 diabetes mellitus with diabetic chronic kidney disease: Secondary | ICD-10-CM

## 2019-07-24 DIAGNOSIS — N2581 Secondary hyperparathyroidism of renal origin: Secondary | ICD-10-CM | POA: Diagnosis present

## 2019-07-24 DIAGNOSIS — Y95 Nosocomial condition: Secondary | ICD-10-CM | POA: Diagnosis present

## 2019-07-24 DIAGNOSIS — N186 End stage renal disease: Secondary | ICD-10-CM

## 2019-07-24 DIAGNOSIS — I252 Old myocardial infarction: Secondary | ICD-10-CM

## 2019-07-24 DIAGNOSIS — Z955 Presence of coronary angioplasty implant and graft: Secondary | ICD-10-CM

## 2019-07-24 DIAGNOSIS — E785 Hyperlipidemia, unspecified: Secondary | ICD-10-CM

## 2019-07-24 DIAGNOSIS — J189 Pneumonia, unspecified organism: Secondary | ICD-10-CM | POA: Diagnosis present

## 2019-07-24 DIAGNOSIS — I2510 Atherosclerotic heart disease of native coronary artery without angina pectoris: Secondary | ICD-10-CM | POA: Diagnosis present

## 2019-07-24 DIAGNOSIS — D631 Anemia in chronic kidney disease: Secondary | ICD-10-CM | POA: Diagnosis present

## 2019-07-24 DIAGNOSIS — Z885 Allergy status to narcotic agent status: Secondary | ICD-10-CM

## 2019-07-24 DIAGNOSIS — J449 Chronic obstructive pulmonary disease, unspecified: Secondary | ICD-10-CM | POA: Diagnosis not present

## 2019-07-24 DIAGNOSIS — R531 Weakness: Secondary | ICD-10-CM

## 2019-07-24 DIAGNOSIS — Z888 Allergy status to other drugs, medicaments and biological substances status: Secondary | ICD-10-CM

## 2019-07-24 DIAGNOSIS — I5042 Chronic combined systolic (congestive) and diastolic (congestive) heart failure: Secondary | ICD-10-CM | POA: Diagnosis present

## 2019-07-24 DIAGNOSIS — F17201 Nicotine dependence, unspecified, in remission: Secondary | ICD-10-CM

## 2019-07-24 LAB — COMPREHENSIVE METABOLIC PANEL
ALT: 11 U/L (ref 0–44)
AST: 14 U/L — ABNORMAL LOW (ref 15–41)
Albumin: 3.1 g/dL — ABNORMAL LOW (ref 3.5–5.0)
Alkaline Phosphatase: 64 U/L (ref 38–126)
Anion gap: 15 (ref 5–15)
BUN: 65 mg/dL — ABNORMAL HIGH (ref 8–23)
CO2: 26 mmol/L (ref 22–32)
Calcium: 8.5 mg/dL — ABNORMAL LOW (ref 8.9–10.3)
Chloride: 98 mmol/L (ref 98–111)
Creatinine, Ser: 9.13 mg/dL — ABNORMAL HIGH (ref 0.44–1.00)
GFR calc Af Amer: 5 mL/min — ABNORMAL LOW (ref >60)
GFR calc non Af Amer: 4 mL/min — ABNORMAL LOW (ref >60)
Glucose, Bld: 116 mg/dL — ABNORMAL HIGH (ref 70–99)
Potassium: 4 mmol/L (ref 3.5–5.1)
Sodium: 139 mmol/L (ref 135–145)
Total Bilirubin: 0.5 mg/dL (ref 0.3–1.2)
Total Protein: 5.5 g/dL — ABNORMAL LOW (ref 6.5–8.1)

## 2019-07-24 LAB — CBC
HCT: 25.3 % — ABNORMAL LOW (ref 36.0–46.0)
Hemoglobin: 8 g/dL — ABNORMAL LOW (ref 12.0–15.0)
MCH: 34.6 pg — ABNORMAL HIGH (ref 26.0–34.0)
MCHC: 31.6 g/dL (ref 30.0–36.0)
MCV: 109.5 fL — ABNORMAL HIGH (ref 80.0–100.0)
Platelets: 198 10*3/uL (ref 150–400)
RBC: 2.31 MIL/uL — ABNORMAL LOW (ref 3.87–5.11)
RDW: 18.8 % — ABNORMAL HIGH (ref 11.5–15.5)
WBC: 5.7 10*3/uL (ref 4.0–10.5)
nRBC: 0 % (ref 0.0–0.2)

## 2019-07-24 LAB — SARS CORONAVIRUS 2 BY RT PCR (HOSPITAL ORDER, PERFORMED IN ~~LOC~~ HOSPITAL LAB): SARS Coronavirus 2: NEGATIVE

## 2019-07-24 MED ORDER — IPRATROPIUM-ALBUTEROL 0.5-2.5 (3) MG/3ML IN SOLN
3.0000 mL | Freq: Three times a day (TID) | RESPIRATORY_TRACT | Status: DC | PRN
  Administered 2019-07-24: 3 mL via RESPIRATORY_TRACT
  Filled 2019-07-24 (×2): qty 3

## 2019-07-24 MED ORDER — PENTAFLUOROPROP-TETRAFLUOROETH EX AERO: 1.0000 "application " | INHALATION_SPRAY | CUTANEOUS | Status: DC | PRN

## 2019-07-24 MED ORDER — HEPARIN SODIUM (PORCINE) 5000 UNIT/ML IJ SOLN
5000.0000 [IU] | Freq: Three times a day (TID) | INTRAMUSCULAR | Status: DC
  Administered 2019-07-25 – 2019-07-26 (×4): 5000 [IU] via SUBCUTANEOUS
  Filled 2019-07-24 (×4): qty 1

## 2019-07-24 MED ORDER — DOXERCALCIFEROL 4 MCG/2ML IV SOLN
5.0000 ug | INTRAVENOUS | Status: DC
  Filled 2019-07-24: qty 4

## 2019-07-24 MED ORDER — ALTEPLASE 2 MG IJ SOLR: 2.0000 mg | Freq: Once | INTRAMUSCULAR | Status: DC | PRN

## 2019-07-24 MED ORDER — DARBEPOETIN ALFA 100 MCG/0.5ML IJ SOSY
100.0000 ug | PREFILLED_SYRINGE | INTRAMUSCULAR | Status: DC
  Filled 2019-07-24: qty 0.5

## 2019-07-24 MED ORDER — IPRATROPIUM-ALBUTEROL 0.5-2.5 (3) MG/3ML IN SOLN: 3.0000 mL | Freq: Two times a day (BID) | RESPIRATORY_TRACT | Status: DC | PRN

## 2019-07-24 MED ORDER — SODIUM CHLORIDE 0.9 % IV SOLN
2.0000 g | INTRAVENOUS | Status: DC
  Filled 2019-07-24: qty 2

## 2019-07-24 MED ORDER — SODIUM CHLORIDE 0.9 % IV SOLN: 100.0000 mL | INTRAVENOUS | Status: DC | PRN

## 2019-07-24 MED ORDER — SODIUM CHLORIDE 0.9 % IV SOLN
2.0000 g | Freq: Once | INTRAVENOUS | Status: DC
  Filled 2019-07-24: qty 2

## 2019-07-24 MED ORDER — CEFDINIR 300 MG PO CAPS
300.0000 mg | ORAL_CAPSULE | ORAL | Status: DC
  Administered 2019-07-24 – 2019-07-25 (×2): 300 mg via ORAL
  Filled 2019-07-24 (×2): qty 1

## 2019-07-24 MED ORDER — AZITHROMYCIN 500 MG PO TABS
250.0000 mg | ORAL_TABLET | Freq: Every day | ORAL | Status: DC
  Administered 2019-07-24 – 2019-07-26 (×3): 250 mg via ORAL
  Filled 2019-07-24 (×3): qty 1

## 2019-07-24 MED ORDER — CHLORHEXIDINE GLUCONATE CLOTH 2 % EX PADS: 6.0000 | MEDICATED_PAD | Freq: Every day | CUTANEOUS | Status: DC

## 2019-07-24 MED ORDER — LIDOCAINE HCL (PF) 1 % IJ SOLN: 5.0000 mL | INTRAMUSCULAR | Status: DC | PRN

## 2019-07-24 MED ORDER — FLUTICASONE FUROATE-VILANTEROL 200-25 MCG/INH IN AEPB
1.0000 | INHALATION_SPRAY | Freq: Every day | RESPIRATORY_TRACT | Status: DC
  Filled 2019-07-24: qty 28

## 2019-07-24 MED ORDER — AZITHROMYCIN 250 MG PO TABS
500.0000 mg | ORAL_TABLET | Freq: Every day | ORAL | Status: DC
  Administered 2019-07-24: 01:00:00 500 mg via ORAL
  Filled 2019-07-24: qty 2

## 2019-07-24 MED ORDER — SODIUM CHLORIDE 0.9 % IV SOLN: 1.0000 g | INTRAVENOUS | Status: DC

## 2019-07-24 MED ORDER — FERRIC CITRATE 1 GM 210 MG(FE) PO TABS: 210.0000 mg | ORAL_TABLET | Freq: Two times a day (BID) | ORAL | Status: DC | PRN

## 2019-07-24 MED ORDER — FLUTICASONE FUROATE-VILANTEROL 100-25 MCG/INH IN AEPB
1.0000 | INHALATION_SPRAY | Freq: Every day | RESPIRATORY_TRACT | Status: DC
  Filled 2019-07-24 (×3): qty 28

## 2019-07-24 MED ORDER — LIDOCAINE-PRILOCAINE 2.5-2.5 % EX CREA: 1.0000 "application " | TOPICAL_CREAM | CUTANEOUS | Status: DC | PRN

## 2019-07-24 MED ORDER — SODIUM CHLORIDE 0.9 % IV SOLN
1.0000 g | Freq: Once | INTRAVENOUS | Status: DC
  Administered 2019-07-24: 01:00:00 1 g via INTRAVENOUS
  Filled 2019-07-24: qty 10

## 2019-07-24 MED ORDER — HEPARIN SODIUM (PORCINE) 1000 UNIT/ML DIALYSIS: 1000.0000 [IU] | INTRAMUSCULAR | Status: DC | PRN

## 2019-07-24 NOTE — Plan of Care (Signed)
°  Problem: Respiratory: °Goal: Ability to maintain adequate ventilation will improve °Outcome: Progressing °  °

## 2019-07-24 NOTE — Procedures (Signed)
Patient seen on Hemodialysis. BP 133/89   Pulse 93   Temp 98.3 F (36.8 C) (Oral)   Resp 20   Ht 5\' 4"  (1.626 m)   Wt 65.6 kg   SpO2 100%   BMI 24.82 kg/m   QB 350, UF goal 3.5L  Tolerating treatment without complaints at this time.   Elmarie Shiley MD [facility]. Office # [phone] Pager # [phone] 4:22 PM

## 2019-07-24 NOTE — Consult Note (Addendum)
[facility] Renal Consultation Note    Indication for Consultation:  Management of ESRD/hemodialysis; anemia, hypertension/volume and secondary hyperparathyroidism PCP: seen by Wandra Feinstein MD  HPI: Kari Robinson is a 68 y.o. female with ESRD secondary to DM + RAS started on PD 05/2018, lost to follow up earlier this year when stopped dialysis, resumed HD at [facility] erratically 01/2019 and transferred to [facility] 07/17/19.  PMHx significant for lung cancer s/p left lobectomy, prior tobacco and alcohol abuse, CAD and COPD and long history of noncompliance with dialysis and consequent underdialysis. She was initially admitted to OBS status and now how full inpatient status and is a partial code.  She was admitted to [facility] after her last admission due to deconditioning.  She had progressive SOB and dry cough, no fever or chills. She never runs her full treatment nor attains her EDW so it is a little difficult to know her true EDW. Weights in computer (~65.5 kg ) are actually lower than EDW which doesn't make sense since last post HD wt was 70.3 9/4, her last HD treatment.  She ran 2 hr 53 min and had a net UF 1.7 L (EDW 68.50.  She was afebrile Friday and afebrile since admission.  VS stable. She makes a significant amount of urine with 850 cc recorded 9/7.  Pertinent labs upon presentation WBC 8.5 87% N, hgb 9.2 trop 62 > 70. Hgb 9.2 > 8 BUN 64 Cr 8.13 today. K 4 CXR showed clear left lung and suspicious right base opacity.  At present she is anxious, c/o SOB with O2 sats in the upper 90s, resp rate 26, slightly tachy. No CP. RN called primary and not due for another neb treatment.  RN working with patient to help calm her.  Past Medical History:  Diagnosis Date  . Alcohol abuse   . Arthralgia   . CAD (coronary artery disease)   . Chronic combined systolic and diastolic HF (heart failure) ([place])   . COPD (chronic obstructive pulmonary disease) ([place])   . Depression   . Dyspnea   .  ESRD (end stage renal disease) ([place])   . HTN (hypertension)   . Hypercholesterolemia    primarily ldl-p and small particles  . Lung cancer [facility])    Patient reports this with surgery.  No details.    [name] RAS (renal artery stenosis) ([place]) 2002   by cath tysinger  . Smoking    Past Surgical History:  Procedure Laterality Date  . abdominal aortogram     perclose of the right femoral artery  . AV FISTULA PLACEMENT Right 07/11/2018   Procedure: Right arm ARTERIOVENOUS FISTULA CREATION;  Surgeon: [name], MD;  Location: [place];  Service: Vascular;  Laterality: Right;  . AVF placement Right   . CARDIAC CATHETERIZATION     left heart catheterization.  Coronary cineangiography. Lft ventricular cineangiography.    [name] LUNG SURGERY     due to lung cancer per patient's report  . TUBAL LIGATION     Family History  Problem Relation Age of Onset  . Emphysema Father   . Cancer Brother    Social History:  reports that she quit smoking about 2 months ago. Her smoking use included cigarettes. She quit after 35.00 years of use. She has never used smokeless tobacco. She reports previous alcohol use. She reports that she does not use drugs. Allergies  Allergen Reactions  . Citalopram Hydrobromide Hives  . Morphine And Related Itching  Prior to Admission medications   Medication Sig Start Date End Date Taking? Authorizing Provider  albuterol (PROVENTIL HFA;VENTOLIN HFA) 108 (90 Base) MCG/ACT inhaler Inhale 2 puffs into the lungs every 4 (four) hours as needed for wheezing or shortness of breath.  11/25/17  Yes [provider]  aspirin EC 81 MG tablet Take 81 mg by mouth daily.   Yes [provider]  atorvastatin (LIPITOR) 40 MG tablet Take 40 mg by mouth daily.   Yes [provider]  AURYXIA 1 GM 210 MG(Fe) tablet Take 210-420 mg by mouth See admin instructions. Take 2 tablets (420 mg) by mouth three times daily with meals and 1 tablet (210 mg) by mouth with each snack  04/06/19  Yes [provider]  b complex-vitamin c-folic acid (NEPHRO-VITE) 0.8 MG TABS tablet Take 1 tablet by mouth See admin instructions. Take 1 tablet by mouth in the morning on Sun/Tues/Thurs/Sat and 1 tablet after dialysis on Mon/Wed/Fri 02/06/19  Yes [provider]  buPROPion (WELLBUTRIN SR) 150 MG 12 hr tablet Take 150 mg by mouth 2 (two) times daily.   Yes [provider]  cinacalcet (SENSIPAR) 30 MG tablet Take 1 tablet (30 mg total) by mouth every Monday, Wednesday, and Friday at 6 PM. 05/12/19  Yes Ogbata, Babs Bertin, MD  clopidogrel (PLAVIX) 75 MG tablet Take 75 mg by mouth daily.   Yes [provider]  Fluticasone-Salmeterol (ADVAIR) 500-50 MCG/DOSE AEPB Inhale 1 puff into the lungs 2 (two) times daily.   Yes [provider]  ipratropium-albuterol (DUONEB) 0.5-2.5 (3) MG/3ML SOLN Take 3 mLs by nebulization every 6 (six) hours as needed. Patient taking differently: Take 3 mLs by nebulization every 6 (six) hours as needed (for wheezing or shortness of breath).  05/11/19  Yes Dana Allan I, MD  lidocaine-prilocaine (EMLA) cream Apply 1 application topically every Monday, Wednesday, and Friday with hemodialysis. Prior to dialysis 04/06/19  Yes [provider]  metoprolol succinate (TOPROL-XL) 25 MG 24 hr tablet Take 1 tablet (25 mg total) by mouth daily. Take with or immediately following a meal. 07/06/19  Yes Ladona Horns, MD  Nutritional Supplements (PROMOD) LIQD Take 30 mLs by mouth 2 (two) times daily.   Yes [provider]  OXYGEN Inhale 3 L/min into the lungs continuous.    Yes [provider]  pantoprazole (PROTONIX) 20 MG tablet Take 1 tablet (20 mg total) by mouth daily. 07/11/19  Yes Ladona Horns, MD  QUEtiapine (SEROQUEL) 100 MG tablet Take 200 mg by mouth at bedtime.   Yes [provider]  tiotropium (SPIRIVA HANDIHALER) 18 MCG inhalation capsule Place 1 capsule (18 mcg total) into inhaler and  inhale daily. 05/11/19 05/10/20 Yes Bonnell Public, MD  Amino Acids-Protein Hydrolys (FEEDING SUPPLEMENT, PRO-STAT SUGAR FREE 64,) LIQD Take 30 mLs by mouth 2 (two) times daily between meals. Patient not taking: Reported on 07/23/2019 07/11/19   Ladona Horns, MD  budesonide-formoterol [facility]) 160-4.5 MCG/ACT inhaler Inhale 2 puffs into the lungs 2 (two) times daily.    [provider]   Current Facility-Administered Medications  Medication Dose Route Frequency Provider Last Rate Last Dose  . aspirin EC tablet 81 mg  81 mg Oral Daily Seawell, Jaimie A, DO   81 mg at 07/24/19 0926  . atorvastatin (LIPITOR) tablet 40 mg  40 mg Oral Daily Seawell, Jaimie A, DO   40 mg at 07/24/19 0926  . azithromycin (ZITHROMAX) tablet 250 mg  250 mg Oral Daily Earlene Plater, MD      .  buPROPion [facility] SR) 12 hr tablet 150 mg  150 mg Oral 2 times per day Seawell, Jaimie A, DO   150 mg at 07/24/19 0926  . cefdinir (OMNICEF) capsule 300 mg  300 mg Oral Q24H Earlene Plater, MD      . Derrill Memo ON 07/25/2019] Chlorhexidine Gluconate Cloth 2 % PADS 6 each  6 each Topical Q0600 Alric Seton, PA-C      . cinacalcet (SENSIPAR) tablet 30 mg  30 mg Oral Q M,W,F-1800 Seawell, Jaimie A, DO      . clopidogrel (PLAVIX) tablet 75 mg  75 mg Oral Daily Seawell, Jaimie A, DO   75 mg at 07/24/19 0926  . [START ON 07/26/2019] Darbepoetin Alfa (ARANESP) injection 100 mcg  100 mcg Intravenous Q Wed-HD Alric Seton, PA-C      . [START ON 07/26/2019] doxercalciferol (HECTOROL) injection 5 mcg  5 mcg Intravenous Q M,W,F-HD Alric Seton, PA-C      . ferric citrate (AURYXIA) tablet 420 mg  420 mg Oral TID WC Seawell, Jaimie A, DO   420 mg at 07/24/19 1256  . fluticasone furoate-vilanterol (BREO ELLIPTA) 100-25 MCG/INH 1 puff  1 puff Inhalation Daily Seawell, Jaimie A, DO      . [START ON 07/25/2019] heparin injection 5,000 Units  5,000 Units Subcutaneous Q8H Seawell, Jaimie A, DO      . ipratropium-albuterol (DUONEB)  0.5-2.5 (3) MG/3ML nebulizer solution 3 mL  3 mL Nebulization TID PRN Seawell, Jaimie A, DO   3 mL at 07/24/19 1124  . metoprolol succinate (TOPROL-XL) 24 hr tablet 25 mg  25 mg Oral Daily Seawell, Jaimie A, DO   25 mg at 07/24/19 0926  . multivitamin (RENA-VIT) tablet 1 tablet  1 tablet Oral QHS Seawell, Jaimie A, DO   1 tablet at 07/24/19 0236  . pantoprazole (PROTONIX) EC tablet 20 mg  20 mg Oral Daily Seawell, Jaimie A, DO   20 mg at 07/24/19 0926  . QUEtiapine (SEROQUEL) tablet 200 mg  200 mg Oral QHS Seawell, Jaimie A, DO   200 mg at 07/24/19 0236   Labs: Basic Metabolic Panel: Recent Labs  Lab 07/19/19 1628 07/23/19 1840 07/24/19 0454  NA 137 138 139  K 4.0 4.5 4.0  CL 97* 97* 98  CO2 20* 24 26  GLUCOSE 192* 139* 116*  BUN 23 60* 65*  CREATININE 4.22* 8.33* 9.13*  CALCIUM 7.9* 8.6* 8.5*   Liver Function Tests: Recent Labs  Lab 07/19/19 1628 07/23/19 1840 07/24/19 0454  AST 20 16 14*  ALT 15 13 11   ALKPHOS 72 76 64  BILITOT 0.6 0.6 0.5  PROT 6.7 6.4* 5.5*  ALBUMIN 3.6 3.6 3.1*   Recent Labs  Lab 07/23/19 1840  LIPASE 27   No results for input(s): AMMONIA in the last 168 hours. CBC: Recent Labs  Lab 07/19/19 1628 07/23/19 1840 07/24/19 0454  WBC 5.6 8.5 5.7  NEUTROABS  --  7.4  --   HGB 9.3* 9.2* 8.0*  HCT 31.3* 30.4* 25.3*  MCV 115.5* 112.6* 109.5*  PLT 224 228 198   Cardiac Enzymes: No results for input(s): CKTOTAL, CKMB, CKMBINDEX, TROPONINI in the last 168 hours. CBG: No results for input(s): GLUCAP in the last 168 hours. Iron Studies: No results for input(s): IRON, TIBC, TRANSFERRIN, FERRITIN in the last 72 hours. Studies/Results: Dg Chest 2 View  Result Date: 07/23/2019 CLINICAL DATA:  Pt complains of SOB. Hx COPD, HTN, and diabetes. Past smoker, quit in 2014. EXAM: CHEST - 2 VIEW  COMPARISON:  Chest radiograph 07/05/2019 FINDINGS: Stable cardiomediastinal contours. Aortic arch calcifications. There are new heterogeneous opacities in the right  lung base suspicious for infection. The left lung is clear. No pneumothorax or large pleural effusion. No acute finding in the visualized skeleton. IMPRESSION: New right basilar opacities suspicious for infection or aspiration. Electronically Signed   By: Audie Pinto M.D.   On: 07/23/2019 18:32   ROS: As per HPI otherwise negative.  Physical Exam: Vitals:   07/24/19 0501 07/24/19 0849 07/24/19 1124 07/24/19 1251  BP: 127/73 129/79    Pulse: 84 89 (!) 104 95  Resp: (!) 21  (!) 24   Temp: 98.2 F (36.8 C) 98.3 F (36.8 C)    TempSrc: Oral Oral    SpO2: 100% 100% 96% 98%  Weight:      Height:         General: chronically ill appearing anxious Head: NCAT sclera not icteric MMM Neck: Supple. No JVD Lungs:  No overt rales, rare wheeze - dim BS overally Heart: tachy reg Abdomen: soft NT + BS M-S:  Muscle atrophy Lower extremities:without sig edema   Neuro: A & O  X 3. Moves all extremities spontaneously. Psych:  Responds to questions appropriately with a anxious affect Dialysis Access: right lower AVF + bruit  Dialysis Orders:  MWF [place] 3.5 hr 350/800 EDW 68.5 2 k 2 Ca right lower AVF no heparin, Aranesp 80 9/2 (hgb decreasing on same dose), hectorol 5 no Fe Recent labs: hgb 9.5 declining, 37% sat 8/28 ferritin 1525 8/28 iPTH 170 P 6.5   Assessment/Plan: 1. Exacerbation of COPD vs PNA -per primary de-escalating meds BC pending 2. ESRD -  MWF - HD today - on no heparin HD - due to prior ? GIB 3. Hypertension/volume  - titrate to edw/current meds 4. Anemia  - will increase Aranesp to 100 - since outpatient hgb gradually declining on steady Aranesp dose of 80 - if hgb continues to drop, check FOBT - Fe adequate when recently drawn; hx ? GIB during 06/20/19 d/c - holding heparin with HD 5. Metabolic bone disease -  Continue Aurixya/sensipar - given high outpatient ferritin, need to consider changing binder 6. Nutrition - renal diet/vit 7. Deconditioning - adm from SNF 8. Hx left  lung cancer s/p lobectomy 2017 9. Hx lumbar spinal stenosis with central disc protrusion 03/2019 - declined surgery 10. Partial code status - does not wish intubation - need to clarify with HD unit after d/c 11. Hx dialysis noncompliance 12. + trop - elevated but much lower than in August - ? significance  Myriam Jacobson, PA-C [place] [phone] 07/24/2019, 1:41 PM

## 2019-07-24 NOTE — Evaluation (Signed)
Physical Therapy Evaluation Patient Details Name: Kari Robinson MRN: [id number] DOB: 1950-12-02 Today's Date: 07/24/2019   History of Present Illness  Pt is a 68 y.o. female admitted from [place] on 07/23/19 with SOB and cough. CXR concerning for PNA. Of note recent d/c to SNF on 07/12/19 with volume overload and dyspnea. PMH includes ESRD (HD MWF), HTN, COPD, CHF, CAD, lung CA, smoking.    Clinical Impression  Pt presents with an overall decrease in functional mobility secondary to above. PTA, pt recent d/c to SNF for post-acute rehab; prior to initial hospital admission 06/2019, pt ambulatory with rollator and lives with boyfriend. Today, pt SOB, anxious and fearful of falling due to BLE tremors. BLE strength 4-5/5 throughout, tremors seem to be erratic with purposeful movement. Pt able to stand, but declining ambulation due to fear of falling and wanting to receive breathing treatment. Recommend continued SNF-level therapies to maximize functional mobility and independence prior to return home. Pt planning to return to [facility].     Follow Up Recommendations SNF;Supervision for mobility/OOB(return to [facility] for continued rehab)    Equipment Recommendations  None recommended by PT    Recommendations for Other Services       Precautions / Restrictions Precautions Precautions: Fall Precaution Comments: frequent falls at home Restrictions Weight Bearing Restrictions: No      Mobility  Bed Mobility Overal bed mobility: Modified Independent Bed Mobility: Supine to Sit;Sit to Supine              Transfers Overall transfer level: Needs assistance Equipment used: Rolling walker (2 wheeled) Transfers: Sit to/from Stand Sit to Stand: Min assist         General transfer comment: MinA to assist trunk elevation and steady RW, but with BLE tremors/knees buckling immediately returning to sit. Able to scoot backwards/sideways at EOB mod indep  Ambulation/Gait              General Gait Details: Pt declining due to tremulous BLEs and anxious, requesting breathing treatment  Stairs            Wheelchair Mobility    Modified Rankin (Stroke Patients Only)       Balance Overall balance assessment: Needs assistance Sitting-balance support: No upper extremity supported;Feet supported Sitting balance-Leahy Scale: Good       Standing balance-Leahy Scale: Poor                               Pertinent Vitals/Pain Pain Assessment: No/denies pain    Home Living Family/patient expects to be discharged to:: Skilled nursing facility                      Prior Function Level of Independence: Needs assistance   Gait / Transfers Assistance Needed: Has been at SNF for rehab since recent d/c 07/11/19; ambulating with rollator. Limited by BUE/BLE shaking/tremors           Hand Dominance        Extremity/Trunk Assessment   Upper Extremity Assessment Upper Extremity Assessment: Generalized weakness(pt reports random UE shaking/tremors to, but not observed during session)    Lower Extremity Assessment Lower Extremity Assessment: RLE deficits/detail;LLE deficits/detail RLE Deficits / Details: MMT 4-5/5 throughout; unpredictable shaking/tremors with purposeful movement, unable to perform heel-to-shin task due to shaking LLE Deficits / Details: MMT 4-5/5 throughout; unpredictable shaking/tremors with purposeful movement, unable to perform heel-to-shin task due to shaking  Communication      Cognition Arousal/Alertness: Awake/alert Behavior During Therapy: Anxious Overall Cognitive Status: No family/caregiver present to determine baseline cognitive functioning Area of Impairment: Attention;Safety/judgement;Awareness                   Current Attention Level: Selective     Safety/Judgement: Decreased awareness of deficits Awareness: Emergent   General Comments: Distracted by anxiety regarding  BLE shaking; difficult to redirect from anxiety/fear related to this and falls      General Comments General comments (skin integrity, edema, etc.): SpO2 96% on 3.5 L O2 [place]; RT present for breathing treatment at end of session per pt request    Exercises     Assessment/Plan    PT Assessment Patient needs continued PT services  PT Problem List Decreased strength;Decreased activity tolerance;Decreased balance;Decreased mobility       PT Treatment Interventions DME instruction;Gait training;Functional mobility training;Therapeutic activities;Therapeutic exercise;Balance training;Patient/family education;Stair training    PT Goals (Current goals can be found in the Care Plan section)  Acute Rehab PT Goals Patient Stated Goal: Continued rehab at SNF PT Goal Formulation: With patient Time For Goal Achievement: 08/07/19 Potential to Achieve Goals: Good    Frequency Min 2X/week   Barriers to discharge        Co-evaluation               AM-PAC PT "6 Clicks" Mobility  Outcome Measure Help needed turning from your back to your side while in a flat bed without using bedrails?: None Help needed moving from lying on your back to sitting on the side of a flat bed without using bedrails?: None Help needed moving to and from a bed to a chair (including a wheelchair)?: A Little Help needed standing up from a chair using your arms (e.g., wheelchair or bedside chair)?: A Little Help needed to walk in hospital room?: A Lot   6 Click Score: 16    End of Session Equipment Utilized During Treatment: Oxygen Activity Tolerance: Other (comment)(Pt limited by tremulous BLEs and anxiety/SOB) Patient left: in bed;with call bell/phone within reach;Other (comment)(with RT present) Nurse Communication: Mobility status PT Visit Diagnosis: Unsteadiness on feet (R26.81);Other abnormalities of gait and mobility (R26.89);History of falling (Z91.81)    Time: 1314-3888 PT Time Calculation (min)  (ACUTE ONLY): 16 min   Charges:   PT Evaluation $PT Eval Moderate Complexity: [place], PT, DPT Acute Rehabilitation Services  Pager [phone] Office [phone]  Derry Lory 07/24/2019, 11:50 AM

## 2019-07-24 NOTE — Progress Notes (Signed)
Pharmacy Antibiotic Note  Kari Robinson is a 68 y.o. female admitted on 07/23/2019 with pneumonia.  Pharmacy has been consulted for cefepime dosing.  Plan: Cefepime 2gm IV x 1 then after each HD F/u cultures and clinical course  Height: 5\' 4"  (162.6 cm) Weight: 145 lb (65.8 kg) IBW/kg (Calculated) : 54.7  Temp (24hrs), Avg:98.3 F (36.8 C), Min:98.3 F (36.8 C), Max:98.3 F (36.8 C)  Recent Labs  Lab 07/19/19 1628 07/23/19 1840  WBC 5.6 8.5  CREATININE 4.22* 8.33*    Estimated Creatinine Clearance: 6 mL/min (A) (by C-G formula based on SCr of 8.33 mg/dL (H)).    Allergies  Allergen Reactions  . Citalopram Hydrobromide Hives  . Morphine And Related Itching     Thank you for allowing pharmacy to be a part of this patient's care.  Excell Seltzer [place] 07/24/2019 1:13 AM

## 2019-07-24 NOTE — Discharge Summary (Addendum)
Name: Kari Robinson MRN: [id number] DOB: 26-Feb-1951 68 y.o. PCP: Tsosie Billing, MD (Inactive)  Date of Admission: 07/23/2019  4:35 PM Date of Discharge: 07/26/2019 Attending Physician: Axel Filler, *  Discharge Diagnosis: 1. Pneumonia  2. ESRD  Discharge Medications: Allergies as of 07/25/2019      Reactions   Citalopram Hydrobromide Hives   Morphine And Related Itching      Medication List    TAKE these medications   albuterol 108 (90 Base) MCG/ACT inhaler Commonly known as: VENTOLIN HFA Inhale 2 puffs into the lungs every 4 (four) hours as needed for wheezing or shortness of breath.   aspirin EC 81 MG tablet Take 81 mg by mouth daily.   atorvastatin 40 MG tablet Commonly known as: LIPITOR Take 40 mg by mouth daily.   Auryxia 1 GM 210 MG(Fe) tablet Generic drug: ferric citrate Take 210-420 mg by mouth See admin instructions. Take 2 tablets (420 mg) by mouth three times daily with meals and 1 tablet (210 mg) by mouth with each snack   azithromycin 250 MG tablet Commonly known as: ZITHROMAX Take 1 tablet (250 mg total) by mouth daily for 3 days. Start taking on: July 26, 2019   b complex-vitamin c-folic acid 0.8 MG Tabs tablet Take 1 tablet by mouth See admin instructions. Take 1 tablet by mouth in the morning on Sun/Tues/Thurs/Sat and 1 tablet after dialysis on Mon/Wed/Fri   budesonide-formoterol 160-4.5 MCG/ACT inhaler Commonly known as: SYMBICORT Inhale 2 puffs into the lungs 2 (two) times daily.   buPROPion 150 MG 12 hr tablet Commonly known as: WELLBUTRIN SR Take 150 mg by mouth 2 (two) times daily.   cefdinir 300 MG capsule Commonly known as: OMNICEF Take 1 capsule (300 mg total) by mouth daily for 3 days.   cinacalcet 30 MG tablet Commonly known as: SENSIPAR Take 1 tablet (30 mg total) by mouth every Monday, Wednesday, and Friday at 6 PM.   clopidogrel 75 MG tablet Commonly known as: PLAVIX Take 75 mg by mouth daily.    feeding supplement (PRO-STAT SUGAR FREE 64) Liqd Take 30 mLs by mouth 2 (two) times daily between meals.   Fluticasone-Salmeterol 500-50 MCG/DOSE Aepb Commonly known as: ADVAIR Inhale 1 puff into the lungs 2 (two) times daily.   ipratropium-albuterol 0.5-2.5 (3) MG/3ML Soln Commonly known as: DUONEB Take 3 mLs by nebulization every 6 (six) hours as needed. What changed: reasons to take this   lidocaine-prilocaine cream Commonly known as: EMLA Apply 1 application topically every Monday, Wednesday, and Friday with hemodialysis. Prior to dialysis   metoprolol succinate 25 MG 24 hr tablet Commonly known as: TOPROL-XL Take 1 tablet (25 mg total) by mouth daily. Take with or immediately following a meal.   OXYGEN Inhale 3 L/min into the lungs continuous.   pantoprazole 20 MG tablet Commonly known as: PROTONIX Take 1 tablet (20 mg total) by mouth daily.   ProMod Liqd Take 30 mLs by mouth 2 (two) times daily.   QUEtiapine 100 MG tablet Commonly known as: SEROQUEL Take 200 mg by mouth at bedtime.   Spiriva HandiHaler 18 MCG inhalation capsule Generic drug: tiotropium Place 1 capsule (18 mcg total) into inhaler and inhale daily.      Disposition and follow-up:   Kari Robinson was discharged from [facility] in fair condition.  At the Robinson follow up visit please address:  1. Pneumonia: Patient initially presented with acute on chronic SOB. with new right basilar opacity  on CXR suspicious for pneumonia.  She received a dose of Cefipime initially then transitioned to oral cefdinir and azithromycin.  2.  Labs / imaging needed at time of follow-up: CBC, BMP, consider CXR if patient's respiratory status worsens.  3.  Pending labs/ test needing follow-up: none   Follow-up Appointments:   None at this time. Pt will be followed up at SNF.  Robinson Course by problem list: 1. Pneumonia: Ms. Kari Robinson is a 68 y.o F with an extensive PMHx including history  cancer in left lower lobectomy in 2017, COPD, ESRD on HD (MWF), hypertension, hyperlipidemia, STEMI with stent placement, A. fib with RVR, HFrEF (EF 40% on 02/25/19) who presented to the [facility] from facility, [facility], with shortness of breath. Workup was significant for a right basilar opacity on CXR suspicious for pneumonia. She received a dose of Cefipime initially then transitioned to oral Cefdinir and Azithromycin with the plan of completing a 5 day course of abx (9/7-9/11) - Continue Cefdinir and Azithromycin once a day. The last day should be 9/11.  2. ESRD (HD MWF): Pt received dialysis the evening of Monday, 9/7, and tolerated it well. This was her normal dialysis day. Continue to get dialysis MWF in the outpatient setting. Pt was discharged from the [facility] with the plan of receiving her dialysis in the outpatient setting prior to returning to SNF.   Discharge Vitals:   BP 129/79 (BP Location: Left Arm)   Pulse 95   Temp 98.3 F (36.8 C) (Oral)   Resp (!) 24   Ht 5\' 4"  (1.626 m)   Wt 65.6 kg   SpO2 98%   BMI 24.82 kg/m   Pertinent Labs, Studies, and Procedures:  CBC Latest Ref Rng & Units 07/25/2019 07/24/2019 07/23/2019  WBC 4.0 - 10.5 K/uL 5.5 5.7 8.5  Hemoglobin 12.0 - 15.0 g/dL 8.4(L) 8.0(L) 9.2(L)  Hematocrit 36.0 - 46.0 % 27.5(L) 25.3(L) 30.4(L)  Platelets 150 - 400 K/uL 197 198 228   BMP Latest Ref Rng & Units 07/25/2019 07/24/2019 07/23/2019  Glucose 70 - 99 mg/dL 98 116(H) 139(H)  BUN 8 - 23 mg/dL 27(H) 65(H) 60(H)  Creatinine 0.44 - 1.00 mg/dL 5.03(H) 9.13(H) 8.33(H)  Sodium 135 - 145 mmol/L 137 139 138  Potassium 3.5 - 5.1 mmol/L 4.0 4.0 4.5  Chloride 98 - 111 mmol/L 98 98 97(L)  CO2 22 - 32 mmol/L 26 26 24   Calcium 8.9 - 10.3 mg/dL 8.3(L) 8.5(L) 8.6(L)   Chest XR: FINDINGS: Stable cardiomediastinal contours. Aortic arch calcifications. There are new heterogeneous opacities in the right lung base suspicious for infection. The left lung is clear. No pneumothorax  or large pleural effusion. No acute finding in the visualized skeleton.  IMPRESSION: New right basilar opacities suspicious for infection or aspiration.  Discharge Instructions:     Earlene Plater, MD Internal Medicine, PGY1 Pager: [phone]  07/25/2019,1:46 PM

## 2019-07-24 NOTE — Progress Notes (Signed)
   Subjective: Pt seen at the bedside this AM on rounds. Reports that she is doing ok today. Denies fever and states that her breathing is now better. States that her legs "won't let her walk". Denies balance issue. She says they are weak and shaky. Reports has been at rehab center for about a week. At baseline, patient reports using walker so she can sit down when she gets short of breath.  Objective:  Vital signs in last 24 hours: Vitals:   07/24/19 0501 07/24/19 0849 07/24/19 1124 07/24/19 1251  BP: 127/73 129/79    Pulse: 84 89 (!) 104 95  Resp: (!) 21  (!) 24   Temp: 98.2 F (36.8 C) 98.3 F (36.8 C)    TempSrc: Oral Oral    SpO2: 100% 100% 96% 98%  Weight:      Height:       Physical Exam: General: Resting comfortably in bed, NAD HEENT: NCAT, nasal cannula in place CV: Irregular rhythm, normal S1 and S2 appreciated. No murmurs, rubs or gallops. PULM: Clear to auscultation bilaterally.  No wheezes or crackles in all lung fields. MSK: 5/5 strength in all extremities Neuro: Alert and oriented, no focal deficits appreciated  Assessment/Plan:  Principal Problem:   Pneumonia Active Problems:   COPD (chronic obstructive pulmonary disease) ([name])   ESRD (end stage renal disease) on dialysis ([name])   Leg weakness, bilateral   Dyspnea  In summary, Kari Robinson is a 68 year old female with a history of lung cancer status post LL L lobectomy in 2017, COPD, ESRD on HD (MWF), HTN, HLD, STEMI with stent placement, A-fib w/ RVR, HFrERF (EF 40% on echo 02/2019) who presented from [place] with acute onset shortness of breath. Work-up was significant for new right basilar opacity on CXR, suspicious for pneumonia.  #Pneumonia: CXR demonstrates new right basilar opacity suspicious for pneumonia.  Patient's has not been febrile, had any malaise or other symptoms to suggest infection. Oxygen requirement is at her baseline, so COPD exacerbation is lower on the differential.  - D/c cefepime,  start cefdinir + azithro today for 5 day course of abx (1/5) - F/u blood cultures. Low suspicion for sepsis.   #COPD: Gold criteria C, Grade 2. Ratio 66%, FEV1 61, FVC 71 with mild restriction. Patient has had at least 3 hospitalizations of COPD exacerbations this year. - Duonebs q3hrs PRN  #ESRD (HD MWF): Due for dialysis today. Nephrology will not be able to dialyze until tomorrow. - HD tomorrow    #FEN/GI - Diet: Renal  #VTE:  - Heparin 5000U q8hrs subq injections  Code status: Limited resuscitation. Patient does not want endotracheal tube placement.  Dispo: Pending medical course. Expect to be discharged to [facility].  Earlene Plater, MD Internal Medicine, PGY1 Pager: [phone]  07/24/2019,1:47 PM

## 2019-07-25 LAB — RENAL FUNCTION PANEL
Albumin: 3.3 g/dL — ABNORMAL LOW (ref 3.5–5.0)
Anion gap: 13 (ref 5–15)
BUN: 27 mg/dL — ABNORMAL HIGH (ref 8–23)
CO2: 26 mmol/L (ref 22–32)
Calcium: 8.3 mg/dL — ABNORMAL LOW (ref 8.9–10.3)
Chloride: 98 mmol/L (ref 98–111)
Creatinine, Ser: 5.03 mg/dL — ABNORMAL HIGH (ref 0.44–1.00)
GFR calc Af Amer: 10 mL/min — ABNORMAL LOW (ref >60)
GFR calc non Af Amer: 8 mL/min — ABNORMAL LOW (ref >60)
Glucose, Bld: 98 mg/dL (ref 70–99)
Phosphorus: 4.9 mg/dL — ABNORMAL HIGH (ref 2.5–4.6)
Potassium: 4 mmol/L (ref 3.5–5.1)
Sodium: 137 mmol/L (ref 135–145)

## 2019-07-25 LAB — CBC
HCT: 27.5 % — ABNORMAL LOW (ref 36.0–46.0)
Hemoglobin: 8.4 g/dL — ABNORMAL LOW (ref 12.0–15.0)
MCH: 34.3 pg — ABNORMAL HIGH (ref 26.0–34.0)
MCHC: 30.5 g/dL (ref 30.0–36.0)
MCV: 112.2 fL — ABNORMAL HIGH (ref 80.0–100.0)
Platelets: 197 10*3/uL (ref 150–400)
RBC: 2.45 MIL/uL — ABNORMAL LOW (ref 3.87–5.11)
RDW: 18.7 % — ABNORMAL HIGH (ref 11.5–15.5)
WBC: 5.5 10*3/uL (ref 4.0–10.5)
nRBC: 0.4 % — ABNORMAL HIGH (ref 0.0–0.2)

## 2019-07-25 LAB — HIV ANTIBODY (ROUTINE TESTING W REFLEX): HIV Screen 4th Generation wRfx: NONREACTIVE

## 2019-07-25 MED ORDER — AZITHROMYCIN 250 MG PO TABS: 250.0000 mg | ORAL_TABLET | Freq: Every day | ORAL | 0 refills | Status: DC

## 2019-07-25 MED ORDER — CHLORHEXIDINE GLUCONATE CLOTH 2 % EX PADS: 6.0000 | MEDICATED_PAD | Freq: Every day | CUTANEOUS | Status: DC

## 2019-07-25 MED ORDER — CEFDINIR 300 MG PO CAPS: 300.0000 mg | ORAL_CAPSULE | Freq: Every day | ORAL | 0 refills | Status: DC

## 2019-07-25 NOTE — Progress Notes (Signed)
[facility] Progress Note   Dialysis Orders: MWF [facility] 3.5 hr 350/800 EDW 68.5 2 k 2 Ca right lower AVF no heparin, Aranesp 80 9/2 (hgb decreasing on same dose), hectorol 5 no Fe Recent labs: hgb 9.5 declining, 37% sat 8/28 ferritin 1525 8/28 iPTH 170 P 6.5   Assessment/Plan: 1. Exacerbation of COPD vs PNA -per primary de-escalating meds COVID neg; afebrile sats good 2. ESRD -  MWF - HD Wed - on no heparin HD - due to prior ? GIB 3. Hypertension/volume  -net UF 1.5 L Monday with  post wt 65  Monday  titrate to edw/current meds- below outpt EDW if accurate - needs standing wt Wed with HD 4. Anemia  -hgb 8.4 9/8 Aranesp  ^ to 100 - since outpatient hgb had been  gradually declining on steady Aranesp dose of 80 - if hgb continues to drop, check FOBT - Fe adequate when recently drawn; hx ? GIB during 06/20/19 d/c - holding heparin with HD 5. Metabolic bone disease -  Continue Aurixya/sensipar - given high outpatient ferritin, need to consider changing binder 6. Nutrition - renal diet/vit 7. Deconditioning - adm from SNF 8. Hx left lung cancer s/p lobectomy 2017 9. Hx lumbar spinal stenosis with central disc protrusion 03/2019 - declined surgery 10. Partial code status - does not wish intubation - need to clarify with HD unit after d/c 11. Hx dialysis noncompliance 12. + trop - elevated but much lower than in August - ? Significance 13. Anxiety   Myriam Jacobson, PA-C [facility] [phone] 07/25/2019,9:16 AM  LOS: 1 day   Subjective:   Breathing easily today  Objective Vitals:   07/24/19 1840 07/24/19 2021 07/25/19 0532 07/25/19 0853  BP: 102/65 110/75 118/80 (!) 102/49  Pulse: 88 85 89 (!) 52  Resp: 18 16 16 18   Temp: 98.4 F (36.9 C) 98 F (36.7 C) 97.8 F (36.6 C) 97.7 F (36.5 C)  TempSrc: Axillary Oral Oral Oral  SpO2: 100% 100% 99% 100%  Weight: 65 kg 65.3 kg    Height:       Physical Exam General: NAD calm Heart: RR with some  ectopy Lungs: no no wheezes or rales Abdomen: soft NT  Extremities: no sig LE edema Dialysis Access: right lower AVF + bruit   Additional Objective Labs: Basic Metabolic Panel: Recent Labs  Lab 07/23/19 1840 07/24/19 0454 07/25/19 0545  NA 138 139 137  K 4.5 4.0 4.0  CL 97* 98 98  CO2 24 26 26   GLUCOSE 139* 116* 98  BUN 60* 65* 27*  CREATININE 8.33* 9.13* 5.03*  CALCIUM 8.6* 8.5* 8.3*  PHOS  --   --  4.9*   Liver Function Tests: Recent Labs  Lab 07/19/19 1628 07/23/19 1840 07/24/19 0454 07/25/19 0545  AST 20 16 14*  --   ALT 15 13 11   --   ALKPHOS 72 76 64  --   BILITOT 0.6 0.6 0.5  --   PROT 6.7 6.4* 5.5*  --   ALBUMIN 3.6 3.6 3.1* 3.3*   Recent Labs  Lab 07/23/19 1840  LIPASE 27   CBC: Recent Labs  Lab 07/19/19 1628 07/23/19 1840 07/24/19 0454 07/25/19 0545  WBC 5.6 8.5 5.7 5.5  NEUTROABS  --  7.4  --   --   HGB 9.3* 9.2* 8.0* 8.4*  HCT 31.3* 30.4* 25.3* 27.5*  MCV 115.5* 112.6* 109.5* 112.2*  PLT 224 228 198 197   Studies/Results: Dg Chest  2 View  Result Date: 07/23/2019 CLINICAL DATA:  Pt complains of SOB. Hx COPD, HTN, and diabetes. Past smoker, quit in 2014. EXAM: CHEST - 2 VIEW COMPARISON:  Chest radiograph 07/05/2019 FINDINGS: Stable cardiomediastinal contours. Aortic arch calcifications. There are new heterogeneous opacities in the right lung base suspicious for infection. The left lung is clear. No pneumothorax or large pleural effusion. No acute finding in the visualized skeleton. IMPRESSION: New right basilar opacities suspicious for infection or aspiration. Electronically Signed   By: Audie Pinto M.D.   On: 07/23/2019 18:32   Medications:  . aspirin EC  81 mg Oral Daily  . atorvastatin  40 mg Oral Daily  . azithromycin  250 mg Oral Daily  . buPROPion  150 mg Oral 2 times per day  . cefdinir  300 mg Oral Q24H  . Chlorhexidine Gluconate Cloth  6 each Topical Q0600  . cinacalcet  30 mg Oral Q M,W,F-1800  . clopidogrel  75 mg Oral  Daily  . [START ON 07/26/2019] darbepoetin (ARANESP) injection - DIALYSIS  100 mcg Intravenous Q Wed-HD  . [START ON 07/26/2019] doxercalciferol  5 mcg Intravenous Q M,W,F-HD  . ferric citrate  420 mg Oral TID WC  . fluticasone furoate-vilanterol  1 puff Inhalation Daily  . heparin  5,000 Units Subcutaneous Q8H  . metoprolol succinate  25 mg Oral Daily  . multivitamin  1 tablet Oral QHS  . pantoprazole  20 mg Oral Daily  . QUEtiapine  200 mg Oral QHS

## 2019-07-25 NOTE — Progress Notes (Signed)
   Subjective: Pt seen at the bedside this AM.  Patient denies having shortness of breath, chest pain or abdominal pain.  We discussed how she can continue her treatment at her facility.  Patient notes that she enjoys working with the therapists in the hospital, but is on board with going back to the facility today.  Objective:  Vital signs in last 24 hours: Vitals:   07/24/19 1830 07/24/19 1840 07/24/19 2021 07/25/19 0532  BP: 127/65 102/65 110/75 118/80  Pulse: 82 88 85 89  Resp:  18 16 16   Temp:  98.4 F (36.9 C) 98 F (36.7 C) 97.8 F (36.6 C)  TempSrc:  Axillary Oral Oral  SpO2:  100% 100% 99%  Weight:  65 kg 65.3 kg   Height:       Physical Exam: General: NAD, resting in bed comfortably HEENT: NCAT, nasal cannula in place CV: RRR, normal S1 and S2 no murmurs, rubs or gallops appreciated PULM: Clear to auscultation bilaterally, no crackles or wheezing appreciated in all lung fields NEURO: Alert and oriented x4, no focal deficits  Assessment/Plan:  Principal Problem:   Pneumonia Active Problems:   COPD (chronic obstructive pulmonary disease) ([name])   ESRD (end stage renal disease) on dialysis ([name])   Leg weakness, bilateral   Dyspnea  In summary, Ms. Loveless is a 68 year old female with a history of lung cancer status post LL L lobectomy in 2017, COPD, ESRD on HD (MWF), HTN, HLD, STEMI with stent placement, A-fib w/ RVR, HFrERF (EF 40% on echo 02/2019) who presented from [place] with acute onset shortness of breath. Work-up was significant for new right basilar opacity on CXR, suspicious for pneumonia.  #Pneumonia: CXR demonstrates new right basilar opacity suspicious for pneumonia.  Patient's has not been febrile, had any malaise, or other symptoms to suggest infection. She does not have leukocytosis. Oxygen requirement is at her baseline. - Continue cefdinir + azithro for 5 day course of abx (2/5)  #COPD: Gold criteria C, Grade 2. Ratio 66%, FEV1 61, FVC 71 with  mild restriction. Patient has had at least 3 hospitalizations of COPD exacerbations this year. - Duonebs q3hrs PRN  #ESRD (HD MWF): Received HD yesterday in the hospital.  - Continue MWF HD in the outpatient setting   #FEN/GI - Diet:Renal  #VTE: - Heparin 5000U q8hrs subq injections  Code status:Limited resuscitation. Patient does not want endotracheal tube placement.  Dispo: Expect to be discharged to [facility] today.  Earlene Plater, MD Internal Medicine, PGY1 Pager: [phone]  07/25/2019,11:28 AM

## 2019-07-25 NOTE — Plan of Care (Signed)
  Problem: Education: Goal: Knowledge of General Education information will improve Description: Including pain rating scale, medication(s)/side effects and non-pharmacologic comfort measures Outcome: Completed/Met   Problem: Health Behavior/Discharge Planning: Goal: Ability to manage health-related needs will improve Outcome: Completed/Met   Problem: Clinical Measurements: Goal: Ability to maintain clinical measurements within normal limits will improve Outcome: Completed/Met Goal: Diagnostic test results will improve Outcome: Completed/Met Goal: Cardiovascular complication will be avoided Outcome: Completed/Met   Problem: Elimination: Goal: Will not experience complications related to bowel motility Outcome: Completed/Met Goal: Will not experience complications related to urinary retention Outcome: Completed/Met   Problem: Pain Managment: Goal: General experience of comfort will improve Outcome: Completed/Met   Problem: Skin Integrity: Goal: Risk for impaired skin integrity will decrease Outcome: Completed/Met   Problem: Education: Goal: Knowledge of disease and its progression will improve Outcome: Completed/Met   Problem: Fluid Volume: Goal: Compliance with measures to maintain balanced fluid volume will improve Outcome: Completed/Met   Problem: Health Behavior/Discharge Planning: Goal: Ability to manage health-related needs will improve Outcome: Completed/Met   Problem: Nutritional: Goal: Ability to make healthy dietary choices will improve Outcome: Completed/Met   Problem: Clinical Measurements: Goal: Complications related to the disease process, condition or treatment will be avoided or minimized Outcome: Completed/Met

## 2019-07-25 NOTE — Evaluation (Signed)
Occupational Therapy Evaluation Patient Details Name: Kari Robinson MRN: [id number] DOB: Jun 09, 1951 Today's Date: 07/25/2019    History of Present Illness Pt is a 68 y.o. female admitted from [place] on 07/23/19 with SOB and cough. CXR concerning for PNA. Of note recent d/c to SNF on 07/12/19 with volume overload and dyspnea. PMH includes ESRD (HD MWF), HTN, COPD, CHF, CAD, lung CA, smoking, ETOH abuse   Clinical Impression   This 68 yo female admitted with above presents to acute OT with decreased balance and intermittent jerking of Bil UEs/LEs both affecting her safety and independence with basic ADLs. She will benefit from acute OT with follow up at SNF to work back towards a Mod I level to return home.    Follow Up Recommendations  SNF;Supervision/Assistance - 24 hour    Equipment Recommendations  Other (comment)(TBD at next venue)       Precautions / Restrictions Precautions Precautions: Fall Precaution Comments: frequent falls at home Restrictions Weight Bearing Restrictions: No      Mobility Bed Mobility Overal bed mobility: Needs Assistance Bed Mobility: Supine to Sit;Sit to Supine     Supine to sit: Min guard Sit to supine: Min guard      Transfers Overall transfer level: Needs assistance Equipment used: Rolling walker (2 wheeled) Transfers: Sit to/from Stand Sit to Stand: Min assist         General transfer comment: min A sit<>stand (pt with jerking of legs intermittently in standing)    Balance Overall balance assessment: Needs assistance Sitting-balance support: No upper extremity supported;Feet supported Sitting balance-Leahy Scale: Good     Standing balance support: Bilateral upper extremity supported;During functional activity Standing balance-Leahy Scale: Poor Standing balance comment: walker and min assist for static standing                           ADL either performed or assessed with clinical judgement   ADL Overall  ADL's : Needs assistance/impaired Eating/Feeding: Independent;Sitting   Grooming: Set up;Sitting   Upper Body Bathing: Set up;Sitting   Lower Body Bathing: Moderate assistance Lower Body Bathing Details (indicate cue type and reason): min A sit<>stand (pt with jerking of legs intermittently in standing) Upper Body Dressing : Set up;Sitting   Lower Body Dressing: Moderate assistance Lower Body Dressing Details (indicate cue type and reason): min A sit<>stand (pt with jerking of legs intermittently in standing) Toilet Transfer: Minimal assistance;Stand-pivot;BSC;RW   Toileting- Clothing Manipulation and Hygiene: Total assistance Toileting - Clothing Manipulation Details (indicate cue type and reason): min A sit<>stand (pt with jerking of legs intermittently in standing)             Vision Baseline Vision/History: Wears glasses Wears Glasses: At all times              Pertinent Vitals/Pain Pain Assessment: No/denies pain     Hand Dominance Right   Extremity/Trunk Assessment Upper Extremity Assessment Upper Extremity Assessment: Generalized weakness(intermittent jerks)           Communication Communication Communication: No difficulties   Cognition Arousal/Alertness: Awake/alert Behavior During Therapy: Anxious Overall Cognitive Status: No family/caregiver present to determine baseline cognitive functioning Area of Impairment: Attention;Safety/judgement;Awareness                   Current Attention Level: Selective     Safety/Judgement: Decreased awareness of safety;Decreased awareness of deficits Awareness: Emergent   General Comments: Talking about how she did not do well  yesterday despite doing better today              Home Living Family/patient expects to be discharged to:: Skilled nursing facility Living Arrangements: Spouse/significant other(boyfriend)                                      Prior Functioning/Environment     Gait / Transfers Assistance Needed: Has been at SNF for rehab since recent d/c 07/11/19; ambulating with rollator.              OT Problem List: Impaired balance (sitting and/or standing);Decreased safety awareness      OT Treatment/Interventions: Self-care/ADL training;DME and/or AE instruction;Patient/family education;Balance training    OT Goals(Current goals can be found in the care plan section) Acute Rehab OT Goals Patient Stated Goal: Continued rehab at SNF if I have to OT Goal Formulation: With patient Time For Goal Achievement: 08/08/19 Potential to Achieve Goals: Good  OT Frequency: Min 2X/week              AM-PAC OT "6 Clicks" Daily Activity     Outcome Measure Help from another person eating meals?: None Help from another person taking care of personal grooming?: A Little Help from another person toileting, which includes using toliet, bedpan, or urinal?: A Lot Help from another person bathing (including washing, rinsing, drying)?: A Lot Help from another person to put on and taking off regular upper body clothing?: A Little Help from another person to put on and taking off regular lower body clothing?: A Lot 6 Click Score: 16   End of Session Equipment Utilized During Treatment: Gait belt;Rolling walker Nurse Communication: (pt urinated in [facility])  Activity Tolerance: Patient tolerated treatment well Patient left: in bed;with call bell/phone within reach;with bed alarm set  OT Visit Diagnosis: Unsteadiness on feet (R26.81);Other abnormalities of gait and mobility (R26.89)                Time: 3888-7579 OT Time Calculation (min): 27 min Charges:  OT General Charges $OT Visit: 1 Visit OT Evaluation $OT Eval Moderate Complexity: 1 Mod OT Treatments $Self Care/Home Management : 8-22 mins  [address], OTR/L Acute [employer] Pager [phone] Office [phone]    Almon Register 07/25/2019, 8:45 AM

## 2019-07-26 LAB — CBC
HCT: 27.3 % — ABNORMAL LOW (ref 36.0–46.0)
Hemoglobin: 8.5 g/dL — ABNORMAL LOW (ref 12.0–15.0)
MCH: 35.1 pg — ABNORMAL HIGH (ref 26.0–34.0)
MCHC: 31.1 g/dL (ref 30.0–36.0)
MCV: 112.8 fL — ABNORMAL HIGH (ref 80.0–100.0)
Platelets: 184 10*3/uL (ref 150–400)
RBC: 2.42 MIL/uL — ABNORMAL LOW (ref 3.87–5.11)
RDW: 18.6 % — ABNORMAL HIGH (ref 11.5–15.5)
WBC: 6.7 10*3/uL (ref 4.0–10.5)
nRBC: 0.3 % — ABNORMAL HIGH (ref 0.0–0.2)

## 2019-07-26 MED ORDER — CEFDINIR 300 MG PO CAPS: 300.0000 mg | ORAL_CAPSULE | Freq: Every day | ORAL | 0 refills | Status: AC

## 2019-07-26 MED ORDER — AZITHROMYCIN 250 MG PO TABS: 250.0000 mg | ORAL_TABLET | Freq: Every day | ORAL | 0 refills | Status: AC

## 2019-07-26 NOTE — Progress Notes (Signed)
   Subjective: Patient seen at the bedside on rounds this morning.  Patient says her breathing is normal and has no complaints today.  She is ready to go back to her facility.  We discussed the plan was to discharge her from the hospital straight to her outpatient dialysis center.  She will complete dialysis then go back to her SNF facility.  Patient is in agreement with this plan.  Objective:  Vital signs in last 24 hours: Vitals:   07/25/19 1741 07/25/19 2213 07/26/19 0329 07/26/19 0708  BP: 135/82 132/80 136/81   Pulse: 88 84 81 83  Resp: 20 18 16 18   Temp: 98.2 F (36.8 C) 98.4 F (36.9 C) 97.8 F (36.6 C)   TempSrc: Oral Oral Oral   SpO2: 94% 95% 100% 100%  Weight:      Height:       Physical Exam: General: NAD resting comfortably in bed HEENT: NCAT, nasal cannula in place CV: RRR, normal S1 and S2 no murmurs rubs or gallops appreciated PULM: Clear to auscultation bilaterally, mild wheezes present NEURO: Alert and oriented x4, no focal deficits  Assessment/Plan:  Principal Problem:   Pneumonia Active Problems:   COPD (chronic obstructive pulmonary disease) ([name])   ESRD (end stage renal disease) on dialysis ([name])   Leg weakness, bilateral   Dyspnea  In summary, 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 Work-up was significant for new right basilar opacity on CXR,suspicious for pneumonia.  #Pneumonia: CXRdemonstrates new right basilar opacity suspicious for pneumonia. Patient's has not been febrile, had any malaise, or other symptoms to suggest infection. She does not have leukocytosis. Oxygen requirement is at her baseline. - Continue cefdinir + azithro for 5 day course of abx (3/5) Stop date 9/11.  #COPD: Gold criteria C, Grade 2.  Ratio 66%, FEV1 61, FVC 71 with mild restriction. Patient has had at least 3 hospitalizations of COPD exacerbations this year. - Duonebs q3hrs PRN  #ESRD(HD MWF):Received HD yesterday in the hospital.  -Continue MWF HD in the outpatient setting   #FEN/GI -Diet:Renal  #VTE: - Heparin 5000U q8hrs subq injections  Codestatus:Limited resuscitation. Patient does not want endotracheal tube placement.  Dispo:Discharged to outpatient dialysis. She will be transported to [facility] (SNF)  today.  Earlene Plater, MD Internal Medicine, PGY1 Pager: [phone]  07/26/2019,2:38 PM

## 2019-07-26 NOTE — Progress Notes (Signed)
Renal Navigator received call back from [facility] confirming that they can pick up from [facility] HD clinic[facility] after HD treatment today. Patient is discharged and needs ambulance transport to [place] clinic this morning as she told RN and Renal Navigator that she cannot walk. CSW/V. Crawford informed. Renal Navigator told Ebony Hail to please bring patient's wheelchair from facility when she is picked up.   Alphonzo Cruise, [place] Renal Navigator [phone]

## 2019-07-26 NOTE — NC FL2 (Signed)
[place] LEVEL OF CARE SCREENING TOOL     IDENTIFICATION  Patient Name: Kari Robinson Birthdate: 05/29/1951 Sex: female Admission Date (Current Location): 07/23/2019  [place] and [place] Number:  Kathleen Argue [id number] [place] and Address:  [facility] [place], [place] [address], [place], [place] [address]      Provider Number: [id number]  Attending Physician Name and Address:  Axel Filler, *  Relative Name and Phone Number:  Maurie Boettcher, son - [phone]    Current Level of Care: Hospital Recommended Level of Care: Skilled Nursing Facility([facility]) Prior Approval Number:    Date Approved/Denied:   PASRR Number:    Discharge Plan: SNF    Current Diagnoses: Patient Active Problem List   Diagnosis Date Noted  . Pneumonia 07/24/2019  . Dyspnea 07/24/2019  . Lumbar stenosis 05/09/2019  . Leg weakness, bilateral 04/01/2019  . Tobacco abuse 04/01/2019  . Alcohol abuse 04/01/2019  . ESRD (end stage renal disease) on dialysis ([place]) 02/24/2019  . Seizure-like activity ([place]) 02/24/2019  . Ischemic cardiomyopathy 02/24/2019  . Type II diabetes mellitus with renal manifestations ([place]) 09/30/2018  . HLD (hyperlipidemia) 09/30/2018  . Depression with anxiety 09/30/2018  . Anemia of chronic disease 09/30/2018  . COPD (chronic obstructive pulmonary disease) ([place])   . Solitary right kidney 06/24/2018  . Chronic systolic CHF (congestive heart failure) ([place]) 06/24/2018  . CKD (chronic kidney disease) stage 5, GFR less than 15 ml/min ([name]) 06/07/2018  . Normocytic normochromic anemia 06/07/2018  . DM (diabetes mellitus), type 2 with renal complications ([place]) 38/08/1750  . Occlusion and stenosis of carotid artery without mention of cerebral infarction 01/19/2012  . RENAL ARTERY STENOSIS 09/10/2010  . DEPRESSION 08/11/2010  . PERIPHERAL VASCULAR DISEASE 08/11/2010  . Essential hypertension 08/08/2010  . Coronary atherosclerosis  08/08/2010    Orientation RESPIRATION BLADDER Height & Weight     Self, Time, Situation, Place  Normal Continent Weight: 143 lb 15.4 oz (65.3 kg) Height:  5\' 4"  (162.6 cm)  BEHAVIORAL SYMPTOMS/MOOD NEUROLOGICAL BOWEL NUTRITION STATUS      Continent Diet(Renal with 1200 mL fluid restriction)  AMBULATORY STATUS COMMUNICATION OF NEEDS Skin   Total Care(Patient did not ambulate with PT on 9/7 due to fatigue) Verbally Other (Comment)(Ecchymosis abdomen)                       Personal Care Assistance Level of Assistance  Bathing, Feeding, Dressing Bathing Assistance: Maximum assistance(Upper body assistance with set-up, lower body mod assist) Feeding assistance: Independent Dressing Assistance: Maximum assistance(Upper body assistance with set-up, lower dody mod assist)     Functional Limitations Info  Sight, Hearing, Speech Sight Info: Adequate Hearing Info: Adequate Speech Info: Adequate    SPECIAL CARE FACTORS FREQUENCY  PT (By licensed PT), OT (By licensed OT)     PT Frequency: Evaluated 9/7 at hospital. PT at SNF [facility] OT Frequency: Evaluated 9/8 at hospital. OT at [facility]            Contractures Contractures Info: Not present    Additional Factors Info  Code Status, Allergies Code Status Info: Partial Allergies Info: Citalopram, Hydrobromide, Morphine           Current Medications (07/26/2019):  This is the current hospital active medication list Current Facility-Administered Medications  Medication Dose Route Frequency Provider Last Rate Last Dose  . aspirin EC tablet 81 mg  81 mg Oral Daily Seawell, Jaimie A, DO  81 mg at 07/26/19 0825  . atorvastatin (LIPITOR) tablet 40 mg  40 mg Oral Daily Seawell, Jaimie A, DO   40 mg at 07/26/19 0825  . azithromycin (ZITHROMAX) tablet 250 mg  250 mg Oral Daily Earlene Plater, MD   250 mg at 07/26/19 0825  . buPROPion [facility] SR) 12 hr tablet 150 mg  150 mg Oral 2 times per day Seawell, Jaimie  A, DO   150 mg at 07/26/19 0549  . cefdinir (OMNICEF) capsule 300 mg  300 mg Oral Q24H Earlene Plater, MD   300 mg at 07/25/19 2142  . Chlorhexidine Gluconate Cloth 2 % PADS 6 each  6 each Topical Q0600 Alric Seton, PA-C      . cinacalcet (SENSIPAR) tablet 30 mg  30 mg Oral Q M,W,F-1800 Seawell, Jaimie A, DO   30 mg at 07/24/19 1956  . clopidogrel (PLAVIX) tablet 75 mg  75 mg Oral Daily Seawell, Jaimie A, DO   75 mg at 07/26/19 0826  . Darbepoetin Alfa (ARANESP) injection 100 mcg  100 mcg Intravenous Q Wed-HD Alric Seton, PA-C      . doxercalciferol (HECTOROL) injection 5 mcg  5 mcg Intravenous Q M,W,F-HD Alric Seton, PA-C      . ferric citrate (AURYXIA) tablet 420 mg  420 mg Oral TID WC Seawell, Jaimie A, DO   420 mg at 07/26/19 0826  . fluticasone furoate-vilanterol (BREO ELLIPTA) 100-25 MCG/INH 1 puff  1 puff Inhalation Daily Seawell, Jaimie A, DO      . heparin injection 5,000 Units  5,000 Units Subcutaneous Q8H Seawell, Jaimie A, DO   5,000 Units at 07/26/19 0550  . ipratropium-albuterol (DUONEB) 0.5-2.5 (3) MG/3ML nebulizer solution 3 mL  3 mL Nebulization TID PRN Seawell, Jaimie A, DO   3 mL at 07/24/19 1124  . metoprolol succinate (TOPROL-XL) 24 hr tablet 25 mg  25 mg Oral Daily Seawell, Jaimie A, DO   25 mg at 07/26/19 0825  . multivitamin (RENA-VIT) tablet 1 tablet  1 tablet Oral QHS Seawell, Jaimie A, DO   1 tablet at 07/25/19 2142  . pantoprazole (PROTONIX) EC tablet 20 mg  20 mg Oral Daily Seawell, Jaimie A, DO   20 mg at 07/26/19 0826  . QUEtiapine (SEROQUEL) tablet 200 mg  200 mg Oral QHS Seawell, Jaimie A, DO   200 mg at 07/25/19 2142     Discharge Medications: Please see discharge summary for a list of discharge medications.  Relevant Imaging Results:  Relevant Lab Results:   Additional Information ss#[id number]; HD patient MWF at [place] - chair time 12:10, needs to arrive at 11:50 pm  [place] Mila Homer, LCSW

## 2019-07-26 NOTE — Progress Notes (Signed)
[name] to be discharged to [facility] per MD order. Patient verbalized understanding.  Skin clean, dry and intact without evidence of skin break down, no evidence of skin tears noted. IV catheter discontinued intact. Site without signs and symptoms of complications. Dressing and pressure applied. Pt denies pain at the site currently. No complaints noted.  Patient free of lines, drains, and wounds.   Discharge packet assembled. An After Visit Summary (AVS) was printed and given to the EMS personnel. Patient escorted via stretcher and discharged to Dialysis center for treatment then to  designated [place] via ambulance. Report called to accepting facility; all questions and concerns addressed.   Shela Commons, RN

## 2019-07-26 NOTE — TOC Transition Note (Addendum)
Transition of Care (TOC) - CM/SW Discharge Note Discharged back to [place] after HD treatment at [facility].   Patient Details  Name: Kari Robinson MRN: [id number] Date of Birth: [birth year]  Transition of Care [facility]) CM/SW Contact:  Sable Feil, LCSW Phone Number: 07/26/2019, 10:49 AM   Clinical Narrative: Facility admissions director Bryson Ha contacted regarding patient's readiness for discharge. Bryson Ha informed that patient will be transported from hospital to HD center and they will transport her to facility once HD treatment completed. Discharge clinicals transmitted to facility. Son contacted and informed of d/c.    Final next level of care: Skilled Nursing Facility([facility]) Barriers to Discharge: No Barriers Identified   Patient Goals and CMS Choice Patient states their goals for this hospitalization and ongoing recovery are:: Get stronger with more rehab and return home CMS Medicare.gov Compare Post Acute Care list provided to:: Other (Comment Required)(Not needed as patient from [facility] and returning there at discharge) Choice offered to / list presented to : NA  Discharge Placement - [place] number confirmed : 07/26/19          Patient chooses bed at: [facility]) Patient to be transferred to facility by: Facility will pick-up patient from [place] dialysis center Name of family member notified: Son Maurie Boettcher - [phone] Patient and family notified of of transfer: 07/26/19  Discharge Plan and Services In-house Referral: Clinical Social Work   Post Acute Care Choice: [place]                               Social Determinants of Health (SDOH) Interventions  No SDOH interventions needed prior to discharge.   Readmission Risk Interventions Readmission Risk Prevention Plan 07/04/2019  Transportation Screening Complete  Medication Review Press photographer) Complete  HRI or  Home Care Consult Complete  Palliative Care Screening Not Applicable  Some recent data might be hidden

## 2019-07-26 NOTE — Progress Notes (Addendum)
[facility] Progress Note   Dialysis Orders: MWF [place] 3.5 hr 350/800 EDW 68.5 2 k 2 Ca right lower AVF no heparin, Aranesp 80 9/2 (hgb decreasing on same dose), hectorol 5 no Fe Recent labs: hgb 9.5 declining, 37% sat 8/28 ferritin 1525 8/28 iPTH 170 P 6.5   Assessment/Plan: 1. Exacerbation of COPD vs PNA -per primary de-escalating meds COVID neg; afebrile sats good 2. ESRD -  MWF - HD Wed - on no heparin HD - due to prior ? GIB - planning transfer to [facility] directly from hospital with [facility] to pick up post HD - d/c orders sent yesterday. 3. Hypertension/volume  -net UF 1.5 L Monday with  post wt 65  Monday  titrate to edw/current meds-  4. Anemia  -hgb 8.5 stable Aranesp  ^ to 100 - since outpatient hgb had been  gradually declining on steady Aranesp dose of 80 - if hgb continues to drop, check FOBT - Fe adequate when recently drawn; hx ? GIB during 06/20/19 d/c - holding heparin with HD 5. Metabolic bone disease -  Continue Aurixya/sensipar - given high outpatient ferritin, need to consider changing binder 6. Nutrition - renal diet/vit 7. Deconditioning - adm from SNF 8. Hx left lung cancer s/p lobectomy 2017 9. Hx lumbar spinal stenosis with central disc protrusion 03/2019 - declined surgery 10. Partial code status - does not wish intubation - need to clarify with HD unit after d/c 11. Hx dialysis noncompliance 12. + trop - elevated but much lower than in August - ? Significance 13. Anxiety- improved   [place] [phone] 07/26/2019,9:04 AM  LOS: 2 days   Subjective:   Breathing easily today on chronic O2 - explained why outpatient dialysis is planned for today.  Objective Vitals:   07/25/19 1741 07/25/19 2213 07/26/19 0329 07/26/19 0708  BP: 135/82 132/80 136/81   Pulse: 88 84 81 83  Resp: 20 18 16 18   Temp: 98.2 F (36.8 C) 98.4 F (36.9 C) 97.8 F (36.6 C)   TempSrc: Oral Oral Oral   SpO2: 94% 95% 100%  100%  Weight:      Height:       Physical Exam General: NAD calm Heart: RRR Lungs: no no wheezes or rales Abdomen: soft NT  Extremities: no sig LE edema Dialysis Access: right lower AVF + bruit   Additional Objective Labs: Basic Metabolic Panel: Recent Labs  Lab 07/23/19 1840 07/24/19 0454 07/25/19 0545  NA 138 139 137  K 4.5 4.0 4.0  CL 97* 98 98  CO2 24 26 26   GLUCOSE 139* 116* 98  BUN 60* 65* 27*  CREATININE 8.33* 9.13* 5.03*  CALCIUM 8.6* 8.5* 8.3*  PHOS  --   --  4.9*   Liver Function Tests: Recent Labs  Lab 07/19/19 1628 07/23/19 1840 07/24/19 0454 07/25/19 0545  AST 20 16 14*  --   ALT 15 13 11   --   ALKPHOS 72 76 64  --   BILITOT 0.6 0.6 0.5  --   PROT 6.7 6.4* 5.5*  --   ALBUMIN 3.6 3.6 3.1* 3.3*   Recent Labs  Lab 07/23/19 1840  LIPASE 27   CBC: Recent Labs  Lab 07/19/19 1628 07/23/19 1840 07/24/19 0454 07/25/19 0545 07/26/19 0452  WBC 5.6 8.5 5.7 5.5 6.7  NEUTROABS  --  7.4  --   --   --   HGB 9.3* 9.2* 8.0* 8.4* 8.5*  HCT 31.3*  30.4* 25.3* 27.5* 27.3*  MCV 115.5* 112.6* 109.5* 112.2* 112.8*  PLT 224 228 198 197 184   Studies/Results: No results found. Medications:  . aspirin EC  81 mg Oral Daily  . atorvastatin  40 mg Oral Daily  . azithromycin  250 mg Oral Daily  . buPROPion  150 mg Oral 2 times per day  . cefdinir  300 mg Oral Q24H  . Chlorhexidine Gluconate Cloth  6 each Topical Q0600  . cinacalcet  30 mg Oral Q M,W,F-1800  . clopidogrel  75 mg Oral Daily  . darbepoetin (ARANESP) injection - DIALYSIS  100 mcg Intravenous Q Wed-HD  . doxercalciferol  5 mcg Intravenous Q M,W,F-HD  . ferric citrate  420 mg Oral TID WC  . fluticasone furoate-vilanterol  1 puff Inhalation Daily  . heparin  5,000 Units Subcutaneous Q8H  . metoprolol succinate  25 mg Oral Daily  . multivitamin  1 tablet Oral QHS  . pantoprazole  20 mg Oral Daily  . QUEtiapine  200 mg Oral QHS

## 2019-07-26 NOTE — TOC Initial Note (Signed)
Transition of Care [facility]) - Initial/Assessment Note    Patient Details  Name: Kari Robinson MRN: [id number] Date of Birth: Oct 02, 1951  Transition of Care [facility]) CM/SW Contact:    Sable Feil, LCSW Phone Number: 07/26/2019, 10:27 AM  Clinical Narrative:  Visited with patient at bedside. Kari Robinson was lying in bed and was alert, oriented and agreeable to taking with CSW. Patient reported that she lives at home with her boyfriend. When asked, Kari Robinson responded that she does plan to discharge back to [place] to continue her rehab before going home. She indicated that she was at the facility about a week before returning to hospital.                 Expected Discharge Plan: Skilled Nursing Facility([facility]) Barriers to Discharge: No Barriers Identified   Patient Goals and CMS Choice Patient states their goals for this hospitalization and ongoing recovery are:: Patient plans to continue rehab at [facility] and then return home CMS Medicare.gov Compare Post Acute Care list provided to:: Other (Comment Required)(List not provided as patient as she is returning to SNF)    Expected Discharge Plan and Services Expected Discharge Plan: Skilled Nursing Facility([place]) In-house Referral: Clinical Social Work   Post Acute Care Choice: [place] Living arrangements for the past 2 months: Apartment Expected Discharge Date: 07/26/19                                   Prior Living Arrangements/Services Living arrangements for the past 2 months: Apartment Lives with:: Domestic Partner(Patient reported that she lives with her boyfriend) Patient language and need for interpreter reviewed:: No Do you feel safe going back to the place where you live?: No(Patient agreeable to returning to SNF to continue rehab before going home)        Care giver support system in place?: Yes (comment)(Patient lives with boyfriend.)   Criminal Activity/Legal  Involvement Pertinent to Current Situation/Hospitalization: No - Comment as needed  Activities of Daily Living Home Assistive Devices/Equipment: Environmental consultant (specify type), Shower chair without back, Bedside commode/3-in-1, Dentures (specify type), Eyeglasses, Grab bars around toilet, Grab bars in shower, Hand-held shower hose, Oxygen, Raised toilet seat with rails, Reacher, Scales ADL Screening (condition at time of admission) Patient's cognitive ability adequate to safely complete daily activities?: Yes Is the patient deaf or have difficulty hearing?: No Does the patient have difficulty seeing, even when wearing glasses/contacts?: No Does the patient have difficulty concentrating, remembering, or making decisions?: No Patient able to express need for assistance with ADLs?: No Does the patient have difficulty dressing or bathing?: No Independently performs ADLs?: Yes (appropriate for developmental age) Does the patient have difficulty walking or climbing stairs?: Yes Weakness of Legs: Both Weakness of Arms/Hands: Left  Permission Sought/Granted Permission sought to share information with : Family Supports Permission granted to share information with : Yes, Verbal Permission Granted  Share Information with NAME: Maurie Boettcher     Permission granted to share info w Relationship: Son  Permission granted to share info w Contact Information: [phone]  Emotional Assessment Appearance:: Appears stated age Attitude/Demeanor/Rapport: Engaged Affect (typically observed): Appropriate Orientation: : Oriented to Self, Oriented to Place, Oriented to  Time, Oriented to Situation Alcohol / Substance Use: Tobacco Use, Alcohol Use, Illicit Drugs(Per H&P patient quite smokint and does not drink or use illicit drugs) Psych Involvement: No (comment)  Admission diagnosis:  COPD  exacerbation ([place]) [J44.1] HCAP (healthcare-associated pneumonia) [J18.9] Patient Active Problem List   Diagnosis Date Noted  .  Pneumonia 07/24/2019  . Dyspnea 07/24/2019  . Lumbar stenosis 05/09/2019  . Leg weakness, bilateral 04/01/2019  . Tobacco abuse 04/01/2019  . Alcohol abuse 04/01/2019  . ESRD (end stage renal disease) on dialysis ([place]) 02/24/2019  . Seizure-like activity ([place]) 02/24/2019  . Ischemic cardiomyopathy 02/24/2019  . Type II diabetes mellitus with renal manifestations ([place]) 09/30/2018  . HLD (hyperlipidemia) 09/30/2018  . Depression with anxiety 09/30/2018  . Anemia of chronic disease 09/30/2018  . COPD (chronic obstructive pulmonary disease) ([place])   . Solitary right kidney 06/24/2018  . Chronic systolic CHF (congestive heart failure) ([place]) 06/24/2018  . CKD (chronic kidney disease) stage 5, GFR less than 15 ml/min ([name]) 06/07/2018  . Normocytic normochromic anemia 06/07/2018  . DM (diabetes mellitus), type 2 with renal complications ([place]) 43/60/6770  . Occlusion and stenosis of carotid artery without mention of cerebral infarction 01/19/2012  . RENAL ARTERY STENOSIS 09/10/2010  . DEPRESSION 08/11/2010  . PERIPHERAL VASCULAR DISEASE 08/11/2010  . Essential hypertension 08/08/2010  . Coronary atherosclerosis 08/08/2010   PCP:  Tsosie Billing, MD (Inactive) Pharmacy:   [facility], [place] - [address], [address], [place] [address] Phone: [phone] Fax: [phone]  [facility] [phone] [place], [place] [address] [place] [address] Phone: [phone] Fax: [phone]  [facility] [place], [place] - [address] [place] [address] Phone: [phone] Fax: [phone]  [facility] [place], [place] - [address] [place], [address] [place] [address] Phone: [phone] Fax: [phone]     Social Determinants of Health  (SDOH) Interventions  No SDOH interventions needed prior to discharge.  Readmission Risk Interventions Readmission Risk Prevention Plan 07/04/2019  Transportation Screening Complete  Medication Review Press photographer) Complete  HRI or Home Care Consult Complete  Palliative Care Screening Not Applicable  Some recent data might be hidden

## 2019-07-26 NOTE — Progress Notes (Signed)
Renal Navigator spoke with [facility] Gardiner Ramus to request that she have SNF transportation pick patient up after dialysis today at [place] HD clinic. If this is acceptable, Renal Navigator will have patient transported to [place] clinic for HD from hospital. Navigator awaiting a call back from [place]. Navigator has updated CSW, RN, Renal PA and Acute HD unit staff.  Alphonzo Cruise, [place] Renal Navigator [phone]

## 2019-08-06 ENCOUNTER — Other Ambulatory Visit: Payer: Self-pay

## 2019-08-06 ENCOUNTER — Emergency Department (HOSPITAL_COMMUNITY)
Admission: EM | Admit: 2019-08-06 | Discharge: 2019-08-06 | Disposition: A | Discharge status: Home / Self Care | Payer: Medicare Other | Source: Home / Self Care | Attending: Emergency Medicine | Admitting: Emergency Medicine

## 2019-08-06 ENCOUNTER — Emergency Department (HOSPITAL_COMMUNITY): Payer: Medicare Other

## 2019-08-06 ENCOUNTER — Encounter (HOSPITAL_COMMUNITY): Payer: Self-pay | Admitting: *Deleted

## 2019-08-06 ENCOUNTER — Encounter (HOSPITAL_COMMUNITY): Payer: Self-pay | Admitting: Emergency Medicine

## 2019-08-06 ENCOUNTER — Emergency Department (HOSPITAL_COMMUNITY)
Admission: EM | Admit: 2019-08-06 | Discharge: 2019-08-06 | Disposition: A | Discharge status: Home / Self Care | Payer: Medicare Other | Attending: Emergency Medicine | Admitting: Emergency Medicine

## 2019-08-06 DIAGNOSIS — J449 Chronic obstructive pulmonary disease, unspecified: Secondary | ICD-10-CM | POA: Insufficient documentation

## 2019-08-06 DIAGNOSIS — Z87891 Personal history of nicotine dependence: Secondary | ICD-10-CM | POA: Insufficient documentation

## 2019-08-06 DIAGNOSIS — E1122 Type 2 diabetes mellitus with diabetic chronic kidney disease: Secondary | ICD-10-CM | POA: Insufficient documentation

## 2019-08-06 DIAGNOSIS — I5042 Chronic combined systolic (congestive) and diastolic (congestive) heart failure: Secondary | ICD-10-CM | POA: Insufficient documentation

## 2019-08-06 DIAGNOSIS — Z7982 Long term (current) use of aspirin: Secondary | ICD-10-CM | POA: Insufficient documentation

## 2019-08-06 DIAGNOSIS — Z79899 Other long term (current) drug therapy: Secondary | ICD-10-CM | POA: Insufficient documentation

## 2019-08-06 DIAGNOSIS — R0602 Shortness of breath: Secondary | ICD-10-CM | POA: Diagnosis present

## 2019-08-06 DIAGNOSIS — Z85118 Personal history of other malignant neoplasm of bronchus and lung: Secondary | ICD-10-CM | POA: Diagnosis not present

## 2019-08-06 DIAGNOSIS — I132 Hypertensive heart and chronic kidney disease with heart failure and with stage 5 chronic kidney disease, or end stage renal disease: Secondary | ICD-10-CM | POA: Insufficient documentation

## 2019-08-06 DIAGNOSIS — J9611 Chronic respiratory failure with hypoxia: Secondary | ICD-10-CM | POA: Diagnosis not present

## 2019-08-06 DIAGNOSIS — I509 Heart failure, unspecified: Secondary | ICD-10-CM

## 2019-08-06 DIAGNOSIS — Z992 Dependence on renal dialysis: Secondary | ICD-10-CM | POA: Insufficient documentation

## 2019-08-06 DIAGNOSIS — N186 End stage renal disease: Secondary | ICD-10-CM

## 2019-08-06 DIAGNOSIS — Z7902 Long term (current) use of antithrombotics/antiplatelets: Secondary | ICD-10-CM | POA: Insufficient documentation

## 2019-08-06 DIAGNOSIS — I2510 Atherosclerotic heart disease of native coronary artery without angina pectoris: Secondary | ICD-10-CM | POA: Diagnosis not present

## 2019-08-06 DIAGNOSIS — Z20828 Contact with and (suspected) exposure to other viral communicable diseases: Secondary | ICD-10-CM | POA: Insufficient documentation

## 2019-08-06 LAB — TROPONIN I (HIGH SENSITIVITY)
Troponin I (High Sensitivity): 47 ng/L — ABNORMAL HIGH (ref <18)
Troponin I (High Sensitivity): 53 ng/L — ABNORMAL HIGH (ref <18)

## 2019-08-06 LAB — CBC WITH DIFFERENTIAL/PLATELET
Abs Immature Granulocytes: 0.04 10*3/uL (ref 0.00–0.07)
Abs Immature Granulocytes: 0.04 10*3/uL (ref 0.00–0.07)
Basophils Absolute: 0.1 10*3/uL (ref 0.0–0.1)
Basophils Absolute: 0 10*3/uL (ref 0.0–0.1)
Basophils Relative: 1 %
Basophils Relative: 0 %
Eosinophils Absolute: 0.2 10*3/uL (ref 0.0–0.5)
Eosinophils Absolute: 0 10*3/uL (ref 0.0–0.5)
Eosinophils Relative: 2 %
Eosinophils Relative: 0 %
HCT: 27.6 % — ABNORMAL LOW (ref 36.0–46.0)
HCT: 29.4 % — ABNORMAL LOW (ref 36.0–46.0)
Hemoglobin: 8.4 g/dL — ABNORMAL LOW (ref 12.0–15.0)
Hemoglobin: 8.9 g/dL — ABNORMAL LOW (ref 12.0–15.0)
Immature Granulocytes: 0 %
Immature Granulocytes: 0 %
Lymphocytes Relative: 6 %
Lymphocytes Relative: 3 %
Lymphs Abs: 0.5 10*3/uL — ABNORMAL LOW (ref 0.7–4.0)
Lymphs Abs: 0.3 10*3/uL — ABNORMAL LOW (ref 0.7–4.0)
MCH: 35 pg — ABNORMAL HIGH (ref 26.0–34.0)
MCH: 34.2 pg — ABNORMAL HIGH (ref 26.0–34.0)
MCHC: 30.4 g/dL (ref 30.0–36.0)
MCHC: 30.3 g/dL (ref 30.0–36.0)
MCV: 115 fL — ABNORMAL HIGH (ref 80.0–100.0)
MCV: 113.1 fL — ABNORMAL HIGH (ref 80.0–100.0)
Monocytes Absolute: 0.6 10*3/uL (ref 0.1–1.0)
Monocytes Absolute: 0.1 10*3/uL (ref 0.1–1.0)
Monocytes Relative: 7 %
Monocytes Relative: 1 %
Neutro Abs: 7.7 10*3/uL (ref 1.7–7.7)
Neutro Abs: 8.5 10*3/uL — ABNORMAL HIGH (ref 1.7–7.7)
Neutrophils Relative %: 84 %
Neutrophils Relative %: 96 %
Platelets: 204 10*3/uL (ref 150–400)
Platelets: 214 10*3/uL (ref 150–400)
RBC: 2.4 MIL/uL — ABNORMAL LOW (ref 3.87–5.11)
RBC: 2.6 MIL/uL — ABNORMAL LOW (ref 3.87–5.11)
RDW: 16.9 % — ABNORMAL HIGH (ref 11.5–15.5)
RDW: 17 % — ABNORMAL HIGH (ref 11.5–15.5)
WBC: 9.4 10*3/uL (ref 4.0–10.5)
WBC: 8.9 10*3/uL (ref 4.0–10.5)
nRBC: 0 % (ref 0.0–0.2)
nRBC: 0 % (ref 0.0–0.2)

## 2019-08-06 LAB — BRAIN NATRIURETIC PEPTIDE
B Natriuretic Peptide: 3516.1 pg/mL — ABNORMAL HIGH (ref 0.0–100.0)
B Natriuretic Peptide: >4500 pg/mL — ABNORMAL HIGH (ref 0.0–100.0)

## 2019-08-06 LAB — BASIC METABOLIC PANEL
Anion gap: 14 (ref 5–15)
Anion gap: 15 (ref 5–15)
BUN: 42 mg/dL — ABNORMAL HIGH (ref 8–23)
BUN: 49 mg/dL — ABNORMAL HIGH (ref 8–23)
CO2: 27 mmol/L (ref 22–32)
CO2: 24 mmol/L (ref 22–32)
Calcium: 9.1 mg/dL (ref 8.9–10.3)
Calcium: 9.5 mg/dL (ref 8.9–10.3)
Chloride: 98 mmol/L (ref 98–111)
Chloride: 102 mmol/L (ref 98–111)
Creatinine, Ser: 6.99 mg/dL — ABNORMAL HIGH (ref 0.44–1.00)
Creatinine, Ser: 7.42 mg/dL — ABNORMAL HIGH (ref 0.44–1.00)
GFR calc Af Amer: 6 mL/min — ABNORMAL LOW (ref >60)
GFR calc Af Amer: 6 mL/min — ABNORMAL LOW (ref >60)
GFR calc non Af Amer: 6 mL/min — ABNORMAL LOW (ref >60)
GFR calc non Af Amer: 5 mL/min — ABNORMAL LOW (ref >60)
Glucose, Bld: 103 mg/dL — ABNORMAL HIGH (ref 70–99)
Glucose, Bld: 147 mg/dL — ABNORMAL HIGH (ref 70–99)
Potassium: 4.5 mmol/L (ref 3.5–5.1)
Potassium: 5.4 mmol/L — ABNORMAL HIGH (ref 3.5–5.1)
Sodium: 139 mmol/L (ref 135–145)
Sodium: 141 mmol/L (ref 135–145)

## 2019-08-06 LAB — SARS CORONAVIRUS 2 BY RT PCR (HOSPITAL ORDER, PERFORMED IN ~~LOC~~ HOSPITAL LAB): SARS Coronavirus 2: NEGATIVE

## 2019-08-06 MED ORDER — IPRATROPIUM-ALBUTEROL 0.5-2.5 (3) MG/3ML IN SOLN
3.0000 mL | Freq: Once | RESPIRATORY_TRACT | Status: AC
  Administered 2019-08-06: 3 mL via RESPIRATORY_TRACT
  Filled 2019-08-06: qty 3

## 2019-08-06 MED ORDER — DEXAMETHASONE SODIUM PHOSPHATE 10 MG/ML IJ SOLN
10.0000 mg | Freq: Once | INTRAMUSCULAR | Status: AC
  Administered 2019-08-06: 07:00:00 10 mg via INTRAVENOUS
  Filled 2019-08-06: qty 1

## 2019-08-06 NOTE — Discharge Instructions (Signed)
Please keep your dialysis appointment as scheduled tomorrow.  Recommend continuing all your prior medications and do not recommend any medication changes at this time.  If you develop any further difficulty breathing, chest pain, fever, or other new concerning symptoms recommend returning to ER for reassessment.  Recommend close recheck with your primary doctor on Monday or Tuesday.

## 2019-08-06 NOTE — ED Notes (Signed)
Pt is a dialysis pt that  Is dialyzed every m w f   Fistula in her rt arm

## 2019-08-06 NOTE — ED Provider Notes (Signed)
[facility] EMERGENCY DEPARTMENT Provider Note   CSN: [id number] Arrival date & time: 08/06/19  2542     History   Chief Complaint Chief Complaint  Patient presents with  . Shortness of Breath    HPI Kari Robinson is a 68 y.o. female.     HPI  This is a 68 year old female with a history of heart failure, COPD, coronary artery disease who presents with shortness of breath.  Patient reports acute onset of shortness of breath worsening overnight.  She came from a SNF.  She reports that she took a Seroquel before bed and laid down and "laying down seem to make it worse."  She was given a DuoNeb with minimal relief.  She reports significant improvement with CPAP on EMS.  She states "CPAP is the thing that only ever helps me."  She had a recent admission for COPD and pneumonia.  She tested negative for COVID at that time but states that she believes she has had a sick contact at her living facility.  Denies any fever or significant cough.  Patient denies chest pain.  EMS administered no additional medications as she was not primarily wheezing.  Patient is on dialysis Monday, Wednesday, and Friday.  Had a full dialysis session on Friday.  She does report that she has recently gained some weight.  At baseline she uses 3 L of oxygen.  Past Medical History:  Diagnosis Date  . Alcohol abuse   . Arthralgia   . CAD (coronary artery disease)   . Chronic combined systolic and diastolic HF (heart failure) ([place])   . COPD (chronic obstructive pulmonary disease) ([place])   . Depression   . Dyspnea   . ESRD (end stage renal disease) ([place])   . HTN (hypertension)   . Hypercholesterolemia    primarily ldl-p and small particles  . Lung cancer [facility])    Patient reports this with surgery.  No details.    [name] RAS (renal artery stenosis) ([place]) 2002   by cath tysinger  . Smoking     Patient Active Problem List   Diagnosis Date Noted  . Pneumonia 07/24/2019  . Dyspnea 07/24/2019  .  Lumbar stenosis 05/09/2019  . Leg weakness, bilateral 04/01/2019  . Tobacco abuse 04/01/2019  . Alcohol abuse 04/01/2019  . ESRD (end stage renal disease) on dialysis ([place]) 02/24/2019  . Seizure-like activity ([place]) 02/24/2019  . Ischemic cardiomyopathy 02/24/2019  . Type II diabetes mellitus with renal manifestations ([place]) 09/30/2018  . HLD (hyperlipidemia) 09/30/2018  . Depression with anxiety 09/30/2018  . Anemia of chronic disease 09/30/2018  . COPD (chronic obstructive pulmonary disease) ([place])   . Solitary right kidney 06/24/2018  . Chronic systolic CHF (congestive heart failure) ([place]) 06/24/2018  . CKD (chronic kidney disease) stage 5, GFR less than 15 ml/min ([name]) 06/07/2018  . Normocytic normochromic anemia 06/07/2018  . DM (diabetes mellitus), type 2 with renal complications ([place]) 70/62/3762  . Occlusion and stenosis of carotid artery without mention of cerebral infarction 01/19/2012  . RENAL ARTERY STENOSIS 09/10/2010  . DEPRESSION 08/11/2010  . PERIPHERAL VASCULAR DISEASE 08/11/2010  . Essential hypertension 08/08/2010  . Coronary atherosclerosis 08/08/2010    Past Surgical History:  Procedure Laterality Date  . abdominal aortogram     perclose of the right femoral artery  . AV FISTULA PLACEMENT Right 07/11/2018   Procedure: Right arm ARTERIOVENOUS FISTULA CREATION;  Surgeon: [name], MD;  Location: [facility];  Service: Vascular;  Laterality: Right;  .  AVF placement Right   . CARDIAC CATHETERIZATION     left heart catheterization.  Coronary cineangiography. Lft ventricular cineangiography.    [name] LUNG SURGERY     due to lung cancer per patient's report  . TUBAL LIGATION       OB History   No obstetric history on file.      Home Medications    Prior to Admission medications   Medication Sig Start Date End Date Taking? Authorizing Provider  albuterol (PROVENTIL HFA;VENTOLIN HFA) 108 (90 Base) MCG/ACT inhaler Inhale 2 puffs into the lungs every 4 (four)  hours as needed for wheezing or shortness of breath.  11/25/17   [provider]  Amino Acids-Protein Hydrolys (FEEDING SUPPLEMENT, PRO-STAT SUGAR FREE 64,) LIQD Take 30 mLs by mouth 2 (two) times daily between meals. Patient not taking: Reported on 07/23/2019 07/11/19   Ladona Horns, MD  aspirin EC 81 MG tablet Take 81 mg by mouth daily.    [provider]  atorvastatin (LIPITOR) 40 MG tablet Take 40 mg by mouth daily.    [provider]  AURYXIA 1 GM 210 MG(Fe) tablet Take 210-420 mg by mouth See admin instructions. Take 2 tablets (420 mg) by mouth three times daily with meals and 1 tablet (210 mg) by mouth with each snack 04/06/19   [provider]  b complex-vitamin c-folic acid (NEPHRO-VITE) 0.8 MG TABS tablet Take 1 tablet by mouth See admin instructions. Take 1 tablet by mouth in the morning on Sun/Tues/Thurs/Sat and 1 tablet after dialysis on Mon/Wed/Fri 02/06/19   [provider]  budesonide-formoterol (SYMBICORT) 160-4.5 MCG/ACT inhaler Inhale 2 puffs into the lungs 2 (two) times daily.    [provider]  buPROPion (WELLBUTRIN SR) 150 MG 12 hr tablet Take 150 mg by mouth 2 (two) times daily.    [provider]  cinacalcet (SENSIPAR) 30 MG tablet Take 1 tablet (30 mg total) by mouth every Monday, Wednesday, and Friday at 6 PM. 05/12/19   Dana Allan I, MD  clopidogrel (PLAVIX) 75 MG tablet Take 75 mg by mouth daily.    [provider]  Fluticasone-Salmeterol (ADVAIR) 500-50 MCG/DOSE AEPB Inhale 1 puff into the lungs 2 (two) times daily.    [provider]  ipratropium-albuterol (DUONEB) 0.5-2.5 (3) MG/3ML SOLN Take 3 mLs by nebulization every 6 (six) hours as needed. Patient taking differently: Take 3 mLs by nebulization every 6 (six) hours as needed (for wheezing or shortness of breath).  05/11/19   Dana Allan I, MD  lidocaine-prilocaine (EMLA) cream Apply 1 application topically every Monday, Wednesday,  and Friday with hemodialysis. Prior to dialysis 04/06/19   [provider]  metoprolol succinate (TOPROL-XL) 25 MG 24 hr tablet Take 1 tablet (25 mg total) by mouth daily. Take with or immediately following a meal. 07/06/19   Ladona Horns, MD  Nutritional Supplements (PROMOD) LIQD Take 30 mLs by mouth 2 (two) times daily.    [provider]  OXYGEN Inhale 3 L/min into the lungs continuous.     [provider]  pantoprazole (PROTONIX) 20 MG tablet Take 1 tablet (20 mg total) by mouth daily. 07/11/19   Ladona Horns, MD  QUEtiapine (SEROQUEL) 100 MG tablet Take 200 mg by mouth at bedtime.    [provider]  tiotropium (SPIRIVA HANDIHALER) 18 MCG inhalation capsule Place 1 capsule (18 mcg total) into inhaler and inhale daily. 05/11/19 05/10/20  Bonnell Public, MD    Family History Family History  Problem  Relation Age of Onset  . Emphysema Father   . Cancer Brother     Social History Social History   Tobacco Use  . Smoking status: Former Smoker    Years: 35.00    Types: Cigarettes    Quit date: 05/16/2019    Years since quitting: 0.2  . Smokeless tobacco: Never Used  . Tobacco comment: 3-4 cigarettes per day  Substance Use Topics  . Alcohol use: Not Currently    Frequency: Never    Comment: states has quit drinking "a few months ago"  . Drug use: Never     Allergies   Citalopram hydrobromide and Morphine and related   Review of Systems Review of Systems  Constitutional: Negative for fever.  Respiratory: Positive for shortness of breath. Negative for cough.   Cardiovascular: Negative for chest pain and leg swelling.  Gastrointestinal: Negative for abdominal pain, nausea and vomiting.  Genitourinary: Negative for dysuria.  Neurological: Negative for headaches.  All other systems reviewed and are negative.    Physical Exam Updated Vital Signs BP (!) 148/85   Pulse 83   Temp 98 F (36.7 C) (Oral)   Resp 20   Ht 1.626 m (5\' 4" )   Wt  66.7 kg   SpO2 99%   BMI 25.23 kg/m   Physical Exam Vitals signs and nursing note reviewed.  Constitutional:      Appearance: She is well-developed. She is not ill-appearing.     Comments: Elderly, chronically ill-appearing  HENT:     Head: Normocephalic and atraumatic.     Mouth/Throat:     Mouth: Mucous membranes are moist.  Eyes:     Pupils: Pupils are equal, round, and reactive to light.  Neck:     Musculoskeletal: Neck supple.  Cardiovascular:     Rate and Rhythm: Normal rate and regular rhythm.     Heart sounds: Normal heart sounds.  Pulmonary:     Effort: Pulmonary effort is normal. No respiratory distress.     Breath sounds: No wheezing.     Comments: Fair air movement, occasional rhonchi, no significant wheezing Abdominal:     General: Bowel sounds are normal.     Palpations: Abdomen is soft.     Tenderness: There is no abdominal tenderness.  Musculoskeletal:     Right lower leg: No edema.     Left lower leg: No edema.  Skin:    General: Skin is warm and dry.  Neurological:     Mental Status: She is alert and oriented to person, place, and time.  Psychiatric:        Mood and Affect: Mood normal.      ED Treatments / Results  Labs (all labs ordered are listed, but only abnormal results are displayed) Labs Reviewed  CBC WITH DIFFERENTIAL/PLATELET - Abnormal; Notable for the following components:      Result Value   RBC 2.40 (*)    Hemoglobin 8.4 (*)    HCT 27.6 (*)    MCV 115.0 (*)    MCH 35.0 (*)    RDW 16.9 (*)    Lymphs Abs 0.5 (*)    All other components within normal limits  BASIC METABOLIC PANEL - Abnormal; Notable for the following components:   Glucose, Bld 103 (*)    BUN 42 (*)    Creatinine, Ser 6.99 (*)    GFR calc non Af Amer 6 (*)    GFR calc Af Amer 6 (*)    All other components  within normal limits  BRAIN NATRIURETIC PEPTIDE - Abnormal; Notable for the following components:   B Natriuretic Peptide 3,516.1 (*)    All other  components within normal limits  SARS CORONAVIRUS 2 (HOSPITAL ORDER, [facility])    EKG EKG Interpretation  Date/Time:  Sunday August 06 2019 03:57:41 EDT Ventricular Rate:  89 PR Interval:    QRS Duration: 105 QT Interval:  375 QTC Calculation: 457 R Axis:   44 Text Interpretation:  Sinus rhythm Repol abnrm, severe global ischemia (LM/MVD) Baseline wander Similar to prior with ST changes globally Confirmed by Thayer Jew [phone]) on 08/06/2019 4:03:08 AM   Radiology Dg Chest Portable 1 View  Result Date: 08/06/2019 CLINICAL DATA:  Sob, possible covid EXAM: PORTABLE CHEST 1 VIEW COMPARISON:  None. FINDINGS: Cardiac silhouette is mildly enlarged. No mediastinal hilar masses. Lungs are hyperexpanded. Thickened interstitial and bronchovascular markings most evident in the bases. Mild scarring is noted at the lung apices. Remainder lungs is clear. No convincing pleural effusion.  No pneumothorax. Skeletal structures are grossly intact. IMPRESSION: 1. Cardiomegaly with mild interstitial thickening well as lung hyperexpansion, without change from the previous exam well as similar to older studies. This may all be chronic. May be a component interstitial edema superimposed on changes of COPD. No evidence of pneumonia. Electronically Signed   By: Lajean Manes M.D.   On: 08/06/2019 04:04    Procedures Procedures (including critical care time)  Medications Ordered in ED Medications  dexamethasone (DECADRON) injection 10 mg (has no administration in time range)     Initial Impression / Assessment and Plan / ED Course  I have reviewed the triage vital signs and the nursing notes.  Pertinent labs & imaging results that were available during my care of the patient were reviewed by me and considered in my medical decision making (see chart for details).        Patient presents with shortness of breath.  Describes getting more short of breath with laying  flat.  She is overall nontoxic-appearing and vital signs are notable for blood pressure of 148/85.  She is on chronic oxygen.  Her pulmonary exam is fairly unremarkable.  She has some scattered rhonchi.  Fair air movement.  No significant wheezing noted.  She was given a DuoNeb in route.  Patient was given IV Decadron to cover COPD and possible COVID.  She was evaluated for COVID given possible exposure.  She is high risk for COPD but no wheezing right now.  Will monitor.  Oxygen was titrated down to her home O2 requirement with good O2 sats.  She did also have an element of volume overload although she has been dialyzed as scheduled and does not appear significantly volume overloaded on exam.  Chest x-ray appears stable with some chronic changes.  No pneumothorax, pneumonia, pleural effusions.  Lab work stable from baseline.  COVID testing negative.  Patient has not required any further intervention and has remained stable while in the emergency room.  She is currently on her home O2 requirement and is in no acute distress.  Repeat pulmonary exam is reassuring.  Suspect she may have some mild volume overload given her symptoms started with laying flat.  However, she is not clinically unstable and I do not feel she needs emergent dialysis.  I have urged her to have dialysis as scheduled tomorrow.  Fluid restriction recommended for today.  Patient stated understanding.  Kari Robinson was evaluated in Emergency Department on  08/06/2019 for the symptoms described in the history of present illness. She was evaluated in the context of the global COVID-19 pandemic, which necessitated consideration that the patient might be at risk for infection with the SARS-CoV-2 virus that causes COVID-19. Institutional protocols and algorithms that pertain to the evaluation of patients at risk for COVID-19 are in a state of rapid change based on information released by regulatory bodies including the CDC and federal and state  organizations. These policies and algorithms were followed during the patient's care in the ED.   Final Clinical Impressions(s) / ED Diagnoses   Final diagnoses:  Chronic respiratory failure with hypoxia [facility])    ED Discharge Orders    None       Dina Rich, Barbette Hair, MD 08/06/19 [phone]

## 2019-08-06 NOTE — Discharge Instructions (Addendum)
You were seen today for shortness of breath.  You were titrated down to your normal oxygen.  You may have some slight volume overload.  Make sure to report to dialysis as scheduled on Monday.  Your COVID testing is negative.

## 2019-08-06 NOTE — ED Provider Notes (Signed)
[facility] EMERGENCY DEPARTMENT Provider Note   CSN: [id number] Arrival date & time: 08/06/19  1208     History   Chief Complaint Chief Complaint  Patient presents with  . Shortness of Breath    HPI Kari Robinson is a 68 y.o. female.  Past medical history ESRD, heart failure, coronary artery disease, lung cancer, presents the ER with episode shortness of breath.  Patient states earlier today at the nursing home she had sudden onset sensation of shortness of breath.  States that this has mostly resolved.  States that she has had intermittent episodes sudden onset shortness of breath that spontaneously resolved.  Did not have any associated chest discomfort with this.  No cough, fever.     HPI  Past Medical History:  Diagnosis Date  . Alcohol abuse   . Arthralgia   . CAD (coronary artery disease)   . Chronic combined systolic and diastolic HF (heart failure) ([place])   . COPD (chronic obstructive pulmonary disease) ([place])   . Depression   . Dyspnea   . ESRD (end stage renal disease) ([place])   . HTN (hypertension)   . Hypercholesterolemia    primarily ldl-p and small particles  . Lung cancer [facility])    Patient reports this with surgery.  No details.    [name] RAS (renal artery stenosis) ([place]) 2002   by cath tysinger  . Smoking     Patient Active Problem List   Diagnosis Date Noted  . Pneumonia 07/24/2019  . Dyspnea 07/24/2019  . Lumbar stenosis 05/09/2019  . Leg weakness, bilateral 04/01/2019  . Tobacco abuse 04/01/2019  . Alcohol abuse 04/01/2019  . ESRD (end stage renal disease) on dialysis ([place]) 02/24/2019  . Seizure-like activity ([place]) 02/24/2019  . Ischemic cardiomyopathy 02/24/2019  . Type II diabetes mellitus with renal manifestations ([place]) 09/30/2018  . HLD (hyperlipidemia) 09/30/2018  . Depression with anxiety 09/30/2018  . Anemia of chronic disease 09/30/2018  . COPD (chronic obstructive pulmonary disease) ([place])   . Solitary right kidney  06/24/2018  . Chronic systolic CHF (congestive heart failure) ([place]) 06/24/2018  . CKD (chronic kidney disease) stage 5, GFR less than 15 ml/min ([name]) 06/07/2018  . Normocytic normochromic anemia 06/07/2018  . DM (diabetes mellitus), type 2 with renal complications ([place]) 89/38/1017  . Occlusion and stenosis of carotid artery without mention of cerebral infarction 01/19/2012  . RENAL ARTERY STENOSIS 09/10/2010  . DEPRESSION 08/11/2010  . PERIPHERAL VASCULAR DISEASE 08/11/2010  . Essential hypertension 08/08/2010  . Coronary atherosclerosis 08/08/2010    Past Surgical History:  Procedure Laterality Date  . abdominal aortogram     perclose of the right femoral artery  . AV FISTULA PLACEMENT Right 07/11/2018   Procedure: Right arm ARTERIOVENOUS FISTULA CREATION;  Surgeon: [name], MD;  Location: [place];  Service: Vascular;  Laterality: Right;  . AVF placement Right   . CARDIAC CATHETERIZATION     left heart catheterization.  Coronary cineangiography. Lft ventricular cineangiography.    [name] LUNG SURGERY     due to lung cancer per patient's report  . TUBAL LIGATION       OB History   No obstetric history on file.      Home Medications    Prior to Admission medications   Medication Sig Start Date End Date Taking? Authorizing Provider  albuterol (PROVENTIL HFA;VENTOLIN HFA) 108 (90 Base) MCG/ACT inhaler Inhale 2 puffs into the lungs every 4 (four) hours as needed for wheezing or shortness of  breath.  11/25/17   [provider]  Amino Acids-Protein Hydrolys (FEEDING SUPPLEMENT, PRO-STAT SUGAR FREE 64,) LIQD Take 30 mLs by mouth 2 (two) times daily between meals. Patient not taking: Reported on 07/23/2019 07/11/19   Ladona Horns, MD  aspirin EC 81 MG tablet Take 81 mg by mouth daily.    [provider]  atorvastatin (LIPITOR) 40 MG tablet Take 40 mg by mouth daily.    [provider]  AURYXIA 1 GM 210 MG(Fe) tablet Take 210-420 mg by mouth See admin  instructions. Take 2 tablets (420 mg) by mouth three times daily with meals and 1 tablet (210 mg) by mouth with each snack 04/06/19   [provider]  b complex-vitamin c-folic acid (NEPHRO-VITE) 0.8 MG TABS tablet Take 1 tablet by mouth See admin instructions. Take 1 tablet by mouth in the morning on Sun/Tues/Thurs/Sat and 1 tablet after dialysis on Mon/Wed/Fri 02/06/19   [provider]  budesonide-formoterol (SYMBICORT) 160-4.5 MCG/ACT inhaler Inhale 2 puffs into the lungs 2 (two) times daily.    [provider]  buPROPion (WELLBUTRIN SR) 150 MG 12 hr tablet Take 150 mg by mouth 2 (two) times daily.    [provider]  cinacalcet (SENSIPAR) 30 MG tablet Take 1 tablet (30 mg total) by mouth every Monday, Wednesday, and Friday at 6 PM. 05/12/19   Dana Allan I, MD  clopidogrel (PLAVIX) 75 MG tablet Take 75 mg by mouth daily.    [provider]  Fluticasone-Salmeterol (ADVAIR) 500-50 MCG/DOSE AEPB Inhale 1 puff into the lungs 2 (two) times daily.    [provider]  ipratropium-albuterol (DUONEB) 0.5-2.5 (3) MG/3ML SOLN Take 3 mLs by nebulization every 6 (six) hours as needed. Patient taking differently: Take 3 mLs by nebulization every 6 (six) hours as needed (for wheezing or shortness of breath).  05/11/19   Dana Allan I, MD  lidocaine-prilocaine (EMLA) cream Apply 1 application topically every Monday, Wednesday, and Friday with hemodialysis. Prior to dialysis 04/06/19   [provider]  metoprolol succinate (TOPROL-XL) 25 MG 24 hr tablet Take 1 tablet (25 mg total) by mouth daily. Take with or immediately following a meal. 07/06/19   Ladona Horns, MD  Nutritional Supplements (PROMOD) LIQD Take 30 mLs by mouth 2 (two) times daily.    [provider]  OXYGEN Inhale 3 L/min into the lungs continuous.     [provider]  pantoprazole (PROTONIX) 20 MG tablet Take 1 tablet (20 mg total) by mouth daily. 07/11/19   Ladona Horns, MD  QUEtiapine (SEROQUEL) 100 MG tablet Take 200 mg by mouth at bedtime.    [provider]  tiotropium (SPIRIVA HANDIHALER) 18 MCG inhalation capsule Place 1 capsule (18 mcg total) into inhaler and inhale daily. 05/11/19 05/10/20  Bonnell Public, MD    Family History Family History  Problem Relation Age of Onset  . Emphysema Father   . Cancer Brother     Social History Social History   Tobacco Use  . Smoking status: Former Smoker    Years: 35.00    Types: Cigarettes    Quit date: 05/16/2019    Years since quitting: 0.2  . Smokeless tobacco: Never Used  . Tobacco comment: 3-4 cigarettes per day  Substance Use Topics  . Alcohol use: Not Currently    Frequency: Never    Comment: states has quit drinking "a few months ago"  . Drug use: Never     Allergies   Citalopram hydrobromide  and Morphine and related   Review of Systems Review of Systems  Constitutional: Negative for chills and fever.  HENT: Negative for ear pain and sore throat.   Eyes: Negative for pain and visual disturbance.  Respiratory: Positive for shortness of breath. Negative for cough.   Cardiovascular: Negative for chest pain and palpitations.  Gastrointestinal: Negative for abdominal pain and vomiting.  Genitourinary: Negative for dysuria and hematuria.  Musculoskeletal: Negative for arthralgias and back pain.  Skin: Negative for color change and rash.  Neurological: Negative for seizures and syncope.  All other systems reviewed and are negative.    Physical Exam Updated Vital Signs BP (!) 170/94   Pulse 95   Temp 98.1 F (36.7 C) (Oral)   Resp 19   SpO2 96%   Physical Exam Vitals signs and nursing note reviewed.  Constitutional:      General: She is not in acute distress.    Appearance: She is well-developed.  HENT:     Head: Normocephalic and atraumatic.  Eyes:     Conjunctiva/sclera: Conjunctivae normal.  Neck:     Musculoskeletal: Neck supple.  Cardiovascular:      Rate and Rhythm: Normal rate and regular rhythm.     Heart sounds: No murmur.  Pulmonary:     Effort: Pulmonary effort is normal. No respiratory distress.     Breath sounds: Normal breath sounds.     Comments: Home 3 L oxygen Abdominal:     Palpations: Abdomen is soft.     Tenderness: There is no abdominal tenderness.  Skin:    General: Skin is warm and dry.  Neurological:     Mental Status: She is alert.      ED Treatments / Results  Labs (all labs ordered are listed, but only abnormal results are displayed) Labs Reviewed  CBC WITH DIFFERENTIAL/PLATELET - Abnormal; Notable for the following components:      Result Value   RBC 2.60 (*)    Hemoglobin 8.9 (*)    HCT 29.4 (*)    MCV 113.1 (*)    MCH 34.2 (*)    RDW 17.0 (*)    Neutro Abs 8.5 (*)    Lymphs Abs 0.3 (*)    All other components within normal limits  BASIC METABOLIC PANEL - Abnormal; Notable for the following components:   Potassium 5.4 (*)    Glucose, Bld 147 (*)    BUN 49 (*)    Creatinine, Ser 7.42 (*)    GFR calc non Af Amer 5 (*)    GFR calc Af Amer 6 (*)    All other components within normal limits  BRAIN NATRIURETIC PEPTIDE - Abnormal; Notable for the following components:   B Natriuretic Peptide >4,500.0 (*)    All other components within normal limits  TROPONIN I (HIGH SENSITIVITY) - Abnormal; Notable for the following components:   Troponin I (High Sensitivity) 47 (*)    All other components within normal limits  TROPONIN I (HIGH SENSITIVITY)    EKG None  Radiology Dg Chest Portable 1 View  Result Date: 08/06/2019 CLINICAL DATA:  Pt to ED for SOB, pt was seen earlier today for same reason, states breathing not improved. Pt hx lung cancer, HTN, COPD, CAD. Pt is a former smoker EXAM: PORTABLE CHEST - 1 VIEW COMPARISON:  Earlier film of the same day FINDINGS: Some interval worsening of bibasilar interstitial infiltrates or edema. Heart size upper limits normal. Aortic Atherosclerosis  (ICD10-170.0). No pneumothorax. The right lateral costophrenic  angle is excluded. Visualized bones unremarkable. IMPRESSION: Worsening bibasilar infiltrates or edema. Electronically Signed   By: Lucrezia Europe M.D.   On: 08/06/2019 13:39   Dg Chest Portable 1 View  Result Date: 08/06/2019 CLINICAL DATA:  Sob, possible covid EXAM: PORTABLE CHEST 1 VIEW COMPARISON:  None. FINDINGS: Cardiac silhouette is mildly enlarged. No mediastinal hilar masses. Lungs are hyperexpanded. Thickened interstitial and bronchovascular markings most evident in the bases. Mild scarring is noted at the lung apices. Remainder lungs is clear. No convincing pleural effusion.  No pneumothorax. Skeletal structures are grossly intact. IMPRESSION: 1. Cardiomegaly with mild interstitial thickening well as lung hyperexpansion, without change from the previous exam well as similar to older studies. This may all be chronic. May be a component interstitial edema superimposed on changes of COPD. No evidence of pneumonia. Electronically Signed   By: Lajean Manes M.D.   On: 08/06/2019 04:04    Procedures Procedures (including critical care time)  Medications Ordered in ED Medications  ipratropium-albuterol (DUONEB) 0.5-2.5 (3) MG/3ML nebulizer solution 3 mL (3 mLs Nebulization Given 08/06/19 1309)     Initial Impression / Assessment and Plan / ED Course  I have reviewed the triage vital signs and the nursing notes.  Pertinent labs & imaging results that were available during my care of the patient were reviewed by me and considered in my medical decision making (see chart for details).  Clinical Course as of Aug 05 1529  Sun Aug 06, 2019  1239 Completed initial assessment   [RD]  1522 Signed out to [facility], pending repeat troponin, if no delta troponin, anticipate discharge   [RD]    Clinical Course User Index [RD] Lucrezia Starch, MD       68 year old lady past medical history prior history of lung cancer, heart failure,  coronary artery disease, ESRD on dialysis presented to the ER with episode shortness of breath.  On my initial assessment patient states her symptoms had mostly resolved.  Gave patient one-time DuoNeb treatment and reassessed.  On reassessment her symptoms had completely resolved and she states she feels at baseline.  Labs patient has baseline elevation in BNP, potassium 5.4, chest x-ray with very mild edema.  Suspect this is all related to her chronic medical problems.  She has dialysis scheduled for tomorrow, after which I believe both this borderline hyperkalemia and borderline edema on chest x-ray will improve.  She is on her home 3 L oxygen, but remains well-appearing without further episodes of shortness of breath.  Repeat troponin pending at time of signout, dissipate will be appropriate for discharge back to nursing facility.   Final Clinical Impressions(s) / ED Diagnoses   Final diagnoses:  ESRD (end stage renal disease) ([place])  Shortness of breath  Chronic heart failure, unspecified heart failure type [facility])    ED Discharge Orders    None       Lucrezia Starch, MD 08/06/19 1531

## 2019-08-06 NOTE — ED Triage Notes (Addendum)
Pt returns via EMS for shortness of breath. Seen for same this morning. Arrives from [facility] home. Arrives on NRB mask. ronchi noted. Pt removed from NRB and placed on 5L O2. Wears 3 @ baseline. 99% on 5L

## 2019-08-06 NOTE — ED Triage Notes (Signed)
Th pt arrived by [facility] from [place] home with sob since approx 0100am   Hx copd    Ems gave the pt c-pap initially thn switched hr to  A non-rebreather  No distress on arrival  Sh had a negative covid one week ago

## 2019-08-06 NOTE — ED Notes (Signed)
[facility] called for transport.  

## 2019-08-06 NOTE — ED Provider Notes (Signed)
Patient presents with episode of shortness of breath that resolved.  Patient stable on 3 L nasal cannula for which she is on at home.  Patient has dialysis scheduled for the morning. Signed out to fup repeat troponin and discharge if similar.  Minimally elevated potassium.  Troponin stable.  Plan for dialysis in the morning. Patient smiling and feels well on discharge. Golda Acre, MD 08/06/19 [phone]

## 2019-08-07 ENCOUNTER — Other Ambulatory Visit: Payer: Self-pay

## 2019-08-07 ENCOUNTER — Emergency Department (HOSPITAL_COMMUNITY)
Admission: EM | Admit: 2019-08-07 | Discharge: 2019-08-07 | Disposition: A | Discharge status: Home / Self Care | Payer: Medicare Other | Attending: Emergency Medicine | Admitting: Emergency Medicine

## 2019-08-07 ENCOUNTER — Encounter (HOSPITAL_COMMUNITY): Payer: Self-pay | Admitting: Emergency Medicine

## 2019-08-07 ENCOUNTER — Emergency Department (HOSPITAL_COMMUNITY): Payer: Medicare Other

## 2019-08-07 DIAGNOSIS — Z79899 Other long term (current) drug therapy: Secondary | ICD-10-CM | POA: Diagnosis not present

## 2019-08-07 DIAGNOSIS — R0789 Other chest pain: Secondary | ICD-10-CM | POA: Diagnosis not present

## 2019-08-07 DIAGNOSIS — R0602 Shortness of breath: Secondary | ICD-10-CM | POA: Insufficient documentation

## 2019-08-07 DIAGNOSIS — R05 Cough: Secondary | ICD-10-CM | POA: Insufficient documentation

## 2019-08-07 DIAGNOSIS — Z992 Dependence on renal dialysis: Secondary | ICD-10-CM | POA: Insufficient documentation

## 2019-08-07 DIAGNOSIS — I132 Hypertensive heart and chronic kidney disease with heart failure and with stage 5 chronic kidney disease, or end stage renal disease: Secondary | ICD-10-CM | POA: Insufficient documentation

## 2019-08-07 DIAGNOSIS — I2510 Atherosclerotic heart disease of native coronary artery without angina pectoris: Secondary | ICD-10-CM | POA: Diagnosis not present

## 2019-08-07 DIAGNOSIS — Z87891 Personal history of nicotine dependence: Secondary | ICD-10-CM | POA: Insufficient documentation

## 2019-08-07 DIAGNOSIS — Z7982 Long term (current) use of aspirin: Secondary | ICD-10-CM | POA: Diagnosis not present

## 2019-08-07 DIAGNOSIS — N186 End stage renal disease: Secondary | ICD-10-CM | POA: Diagnosis not present

## 2019-08-07 DIAGNOSIS — J449 Chronic obstructive pulmonary disease, unspecified: Secondary | ICD-10-CM | POA: Insufficient documentation

## 2019-08-07 DIAGNOSIS — I5042 Chronic combined systolic (congestive) and diastolic (congestive) heart failure: Secondary | ICD-10-CM | POA: Diagnosis not present

## 2019-08-07 NOTE — ED Provider Notes (Signed)
[facility]PART[facility] Provider Note   CSN: [id number] Arrival date & time: 08/07/19  7619     History   Chief Complaint Chief Complaint  Patient presents with  . Shortness of Breath    HPI Florice Hindle is a 68 y.o. female.     HPI  68 year old female presents with transient shortness of breath.  This has been happening on and off for a few days.  Some cough and some chest pain with coughing.  Shortness of breath seem to come out of nowhere.  She was coloring in a chair and watching TV when it started.  Currently feels better.  Last had dialysis on Friday and today (Monday) is her next date but she has already missed it as she had no longer has transportation.  No leg swelling.  No fever or productive cough or hemoptysis. Chronically on oxygen.  Past Medical History:  Diagnosis Date  . Alcohol abuse   . Arthralgia   . CAD (coronary artery disease)   . Chronic combined systolic and diastolic HF (heart failure) ([place])   . COPD (chronic obstructive pulmonary disease) ([place])   . Depression   . Dyspnea   . ESRD (end stage renal disease) ([place])   . HTN (hypertension)   . Hypercholesterolemia    primarily ldl-p and small particles  . Lung cancer [facility])    Patient reports this with surgery.  No details.    [name] RAS (renal artery stenosis) ([place]) 2002   by cath tysinger  . Smoking     Patient Active Problem List   Diagnosis Date Noted  . Pneumonia 07/24/2019  . Dyspnea 07/24/2019  . Lumbar stenosis 05/09/2019  . Leg weakness, bilateral 04/01/2019  . Tobacco abuse 04/01/2019  . Alcohol abuse 04/01/2019  . ESRD (end stage renal disease) on dialysis ([place]) 02/24/2019  . Seizure-like activity ([place]) 02/24/2019  . Ischemic cardiomyopathy 02/24/2019  . Type II diabetes mellitus with renal manifestations ([place]) 09/30/2018  . HLD (hyperlipidemia) 09/30/2018  . Depression with anxiety 09/30/2018  . Anemia of chronic disease 09/30/2018  . COPD (chronic  obstructive pulmonary disease) ([place])   . Solitary right kidney 06/24/2018  . Chronic systolic CHF (congestive heart failure) ([place]) 06/24/2018  . CKD (chronic kidney disease) stage 5, GFR less than 15 ml/min ([name]) 06/07/2018  . Normocytic normochromic anemia 06/07/2018  . DM (diabetes mellitus), type 2 with renal complications ([place]) 50/93/2671  . Occlusion and stenosis of carotid artery without mention of cerebral infarction 01/19/2012  . RENAL ARTERY STENOSIS 09/10/2010  . DEPRESSION 08/11/2010  . PERIPHERAL VASCULAR DISEASE 08/11/2010  . Essential hypertension 08/08/2010  . Coronary atherosclerosis 08/08/2010    Past Surgical History:  Procedure Laterality Date  . abdominal aortogram     perclose of the right femoral artery  . AV FISTULA PLACEMENT Right 07/11/2018   Procedure: Right arm ARTERIOVENOUS FISTULA CREATION;  Surgeon: [name], MD;  Location: [facility];  Service: Vascular;  Laterality: Right;  . AVF placement Right   . CARDIAC CATHETERIZATION     left heart catheterization.  Coronary cineangiography. Lft ventricular cineangiography.    [name] LUNG SURGERY     due to lung cancer per patient's report  . TUBAL LIGATION       OB History   No obstetric history on file.      Home Medications    Prior to Admission medications   Medication Sig Start Date End Date Taking? Authorizing Provider  albuterol (PROVENTIL HFA;VENTOLIN  HFA) 108 (90 Base) MCG/ACT inhaler Inhale 2 puffs into the lungs every 4 (four) hours as needed for wheezing or shortness of breath.  11/25/17   [provider]  Amino Acids-Protein Hydrolys (FEEDING SUPPLEMENT, PRO-STAT SUGAR FREE 64,) LIQD Take 30 mLs by mouth 2 (two) times daily between meals. Patient not taking: Reported on 07/23/2019 07/11/19   Ladona Horns, MD  aspirin EC 81 MG tablet Take 81 mg by mouth daily.    [provider]  atorvastatin (LIPITOR) 40 MG tablet Take 40 mg by mouth daily.    [provider]  AURYXIA 1  GM 210 MG(Fe) tablet Take 210-420 mg by mouth See admin instructions. Take 2 tablets (420 mg) by mouth three times daily with meals and 1 tablet (210 mg) by mouth with each snack 04/06/19   [provider]  b complex-vitamin c-folic acid (NEPHRO-VITE) 0.8 MG TABS tablet Take 1 tablet by mouth See admin instructions. Take 1 tablet by mouth in the morning on Sun/Tues/Thurs/Sat and 1 tablet after dialysis on Mon/Wed/Fri 02/06/19   [provider]  budesonide-formoterol (SYMBICORT) 160-4.5 MCG/ACT inhaler Inhale 2 puffs into the lungs 2 (two) times daily.    [provider]  buPROPion (WELLBUTRIN SR) 150 MG 12 hr tablet Take 150 mg by mouth 2 (two) times daily.    [provider]  cinacalcet (SENSIPAR) 30 MG tablet Take 1 tablet (30 mg total) by mouth every Monday, Wednesday, and Friday at 6 PM. 05/12/19   Dana Allan I, MD  clopidogrel (PLAVIX) 75 MG tablet Take 75 mg by mouth daily.    [provider]  Fluticasone-Salmeterol (ADVAIR) 500-50 MCG/DOSE AEPB Inhale 1 puff into the lungs 2 (two) times daily.    [provider]  ipratropium-albuterol (DUONEB) 0.5-2.5 (3) MG/3ML SOLN Take 3 mLs by nebulization every 6 (six) hours as needed. Patient taking differently: Take 3 mLs by nebulization every 6 (six) hours as needed (for wheezing or shortness of breath).  05/11/19   Dana Allan I, MD  lidocaine-prilocaine (EMLA) cream Apply 1 application topically every Monday, Wednesday, and Friday with hemodialysis. Prior to dialysis 04/06/19   [provider]  metoprolol succinate (TOPROL-XL) 25 MG 24 hr tablet Take 1 tablet (25 mg total) by mouth daily. Take with or immediately following a meal. 07/06/19   Ladona Horns, MD  Nutritional Supplements (PROMOD) LIQD Take 30 mLs by mouth 2 (two) times daily.    [provider]  OXYGEN Inhale 3 L/min into the lungs continuous.     [provider]  pantoprazole (PROTONIX) 20 MG tablet Take  1 tablet (20 mg total) by mouth daily. 07/11/19   Ladona Horns, MD  QUEtiapine (SEROQUEL) 100 MG tablet Take 200 mg by mouth at bedtime.    [provider]  tiotropium (SPIRIVA HANDIHALER) 18 MCG inhalation capsule Place 1 capsule (18 mcg total) into inhaler and inhale daily. 05/11/19 05/10/20  Bonnell Public, MD    Family History Family History  Problem Relation Age of Onset  . Emphysema Father   . Cancer Brother     Social History Social History   Tobacco Use  . Smoking status: Former Smoker    Years: 35.00    Types: Cigarettes    Quit date: 05/16/2019    Years since quitting: 0.2  . Smokeless tobacco: Never Used  . Tobacco comment: 3-4 cigarettes per day  Substance Use Topics  . Alcohol use: Not Currently    Frequency: Never  Comment: states has quit drinking "a few months ago"  . Drug use: Never     Allergies   Citalopram hydrobromide and Morphine and related   Review of Systems Review of Systems  Constitutional: Negative for fever.  Respiratory: Positive for cough and shortness of breath.   Cardiovascular: Positive for chest pain. Negative for leg swelling.  All other systems reviewed and are negative.    Physical Exam Updated Vital Signs BP (!) 148/111   Pulse 92   Temp 97.6 F (36.4 C)   Resp 20   SpO2 98%   Physical Exam Vitals signs and nursing note reviewed.  Constitutional:      General: She is not in acute distress.    Appearance: She is well-developed. She is not ill-appearing or diaphoretic.  HENT:     Head: Normocephalic and atraumatic.     Right Ear: External ear normal.     Left Ear: External ear normal.     Nose: Nose normal.  Eyes:     General:        Right eye: No discharge.        Left eye: No discharge.  Cardiovascular:     Rate and Rhythm: Normal rate and regular rhythm.     Heart sounds: Normal heart sounds.  Pulmonary:     Effort: Pulmonary effort is normal.     Breath sounds: Examination of the right-lower  field reveals rales. Examination of the left-lower field reveals rales. Rales present.  Chest:     Chest wall: Tenderness present.  Abdominal:     Palpations: Abdomen is soft.     Tenderness: There is no abdominal tenderness.  Musculoskeletal:     Right lower leg: No edema.     Left lower leg: No edema.  Skin:    General: Skin is warm and dry.  Neurological:     Mental Status: She is alert.  Psychiatric:        Mood and Affect: Mood is not anxious.      ED Treatments / Results  Labs (all labs ordered are listed, but only abnormal results are displayed) Labs Reviewed - No data to display  EKG EKG Interpretation  Date/Time:  Monday August 07 2019 09:10:36 EDT Ventricular Rate:  92 PR Interval:  172 QRS Duration: 92 QT Interval:  382 QTC Calculation: 472 R Axis:   70 Text Interpretation:  Normal sinus rhythm ST & T wave abnormality, consider inferolateral ischemia Prolonged QT Abnormal ECG ST/T changes similar to Sept 20 2020 Interpretation limited secondary to artifact Confirmed by Sherwood Gambler [phone]) on 08/07/2019 9:17:34 AM   Radiology Dg Chest 2 View  Result Date: 08/07/2019 CLINICAL DATA:  Dyspnea, shortness of breath EXAM: CHEST - 2 VIEW COMPARISON:  08/06/2019 FINDINGS: Cardiomegaly and aortic atherosclerosis. No significant change in diffuse bilateral interstitial opacity, most conspicuous at the lung bases. Unchanged elevation of the left hemidiaphragm. The visualized skeletal structures are unremarkable. IMPRESSION: 1. No significant change in examination with cardiomegaly and diffuse bilateral interstitial opacity, most conspicuous at the lung bases, concerning for edema or infection. No new or focal airspace opacity. 2.  Cardiomegaly. Electronically Signed   By: Eddie Candle M.D.   On: 08/07/2019 11:21   Dg Chest Portable 1 View  Result Date: 08/06/2019 CLINICAL DATA:  Pt to ED for SOB, pt was seen earlier today for same reason, states breathing not  improved. Pt hx lung cancer, HTN, COPD, CAD. Pt is a former smoker EXAM: PORTABLE CHEST -  1 VIEW COMPARISON:  Earlier film of the same day FINDINGS: Some interval worsening of bibasilar interstitial infiltrates or edema. Heart size upper limits normal. Aortic Atherosclerosis (ICD10-170.0). No pneumothorax. The right lateral costophrenic angle is excluded. Visualized bones unremarkable. IMPRESSION: Worsening bibasilar infiltrates or edema. Electronically Signed   By: Lucrezia Europe M.D.   On: 08/06/2019 13:39   Dg Chest Portable 1 View  Result Date: 08/06/2019 CLINICAL DATA:  Sob, possible covid EXAM: PORTABLE CHEST 1 VIEW COMPARISON:  None. FINDINGS: Cardiac silhouette is mildly enlarged. No mediastinal hilar masses. Lungs are hyperexpanded. Thickened interstitial and bronchovascular markings most evident in the bases. Mild scarring is noted at the lung apices. Remainder lungs is clear. No convincing pleural effusion.  No pneumothorax. Skeletal structures are grossly intact. IMPRESSION: 1. Cardiomegaly with mild interstitial thickening well as lung hyperexpansion, without change from the previous exam well as similar to older studies. This may all be chronic. May be a component interstitial edema superimposed on changes of COPD. No evidence of pneumonia. Electronically Signed   By: Lajean Manes M.D.   On: 08/06/2019 04:04    Procedures Procedures (including critical care time)  Medications Ordered in ED Medications - No data to display   Initial Impression / Assessment and Plan / ED Course  I have reviewed the triage vital signs and the nursing notes.  Pertinent labs & imaging results that were available during my care of the patient were reviewed by me and considered in my medical decision making (see chart for details).        Dialysis coordinator noted the patient was in the ED.  Given the patient is due for dialysis around noon, if possible she recommends sending patient over to her dialysis  center.  [facility] is willing to take patient.  Patient is hemodynamically stable, not hypoxic, and not having any acute dyspnea now.  ECG does not show obvious signs of hyperkalemia.  Given she had a recent work-up just yesterday, I think is reasonable to let her go straight to dialysis as she seems stable.  Patient agrees with plan.  Final Clinical Impressions(s) / ED Diagnoses   Final diagnoses:  Shortness of breath    ED Discharge Orders    None       Sherwood Gambler, MD 08/07/19 1135

## 2019-08-07 NOTE — ED Notes (Signed)
Pt states that her symptoms have seemed to disappear.

## 2019-08-07 NOTE — ED Triage Notes (Addendum)
[facility] [address] ems reports staff did not call out, that patient called out stating she had sob and was very anxious. EMS VSS, wears 3L O2 at baseline-99%. Pt seen yesterday for the same. Pt also reports she was supposed to go to dialysis today as well. resp e/u, nad.

## 2019-08-07 NOTE — Progress Notes (Signed)
Renal Navigator noted that patient was in ED again today (also worked up yesterday and discharged back to SNF). RN had noted that patient said her symptoms had seemed to have disappeared. Therefore, Renal Navigator met with RN and MD in ED to discuss possibility of arranging for patient to be discharged to [facility] HD clinic[facility]. It was decided that she was stable to do so. Renal Navigator confirmed with Clinic Charge RN/[place] that they would accommodate her today even if she was late to the clinic. Navigator called [facility] for transport. Renal Navigator called [facility] to pick patient up from OP HD clinic at end of treatment today. Patient is cleared for discharged from OP HD standpoint-RN and MD aware and are working on discharge now. Renal Navigator appreciates team assistance.  Alphonzo Cruise, [place] Renal Navigator [phone]

## 2019-08-07 NOTE — Discharge Instructions (Signed)
Go straight to Dialysis.

## 2019-10-17 ENCOUNTER — Emergency Department (HOSPITAL_COMMUNITY)
Admission: EM | Admit: 2019-10-17 | Discharge: 2019-10-18 | Disposition: A | Discharge status: Home / Self Care | Payer: Medicare Other | Source: Home / Self Care | Attending: Emergency Medicine | Admitting: Emergency Medicine

## 2019-10-17 ENCOUNTER — Encounter (HOSPITAL_COMMUNITY): Payer: Self-pay

## 2019-10-17 DIAGNOSIS — I2510 Atherosclerotic heart disease of native coronary artery without angina pectoris: Secondary | ICD-10-CM | POA: Insufficient documentation

## 2019-10-17 DIAGNOSIS — R4701 Aphasia: Secondary | ICD-10-CM

## 2019-10-17 DIAGNOSIS — I5042 Chronic combined systolic (congestive) and diastolic (congestive) heart failure: Secondary | ICD-10-CM | POA: Insufficient documentation

## 2019-10-17 DIAGNOSIS — N186 End stage renal disease: Secondary | ICD-10-CM | POA: Insufficient documentation

## 2019-10-17 DIAGNOSIS — Z7982 Long term (current) use of aspirin: Secondary | ICD-10-CM | POA: Insufficient documentation

## 2019-10-17 DIAGNOSIS — E875 Hyperkalemia: Secondary | ICD-10-CM

## 2019-10-17 DIAGNOSIS — E1122 Type 2 diabetes mellitus with diabetic chronic kidney disease: Secondary | ICD-10-CM | POA: Insufficient documentation

## 2019-10-17 DIAGNOSIS — Z992 Dependence on renal dialysis: Secondary | ICD-10-CM | POA: Insufficient documentation

## 2019-10-17 DIAGNOSIS — Z79899 Other long term (current) drug therapy: Secondary | ICD-10-CM | POA: Insufficient documentation

## 2019-10-17 DIAGNOSIS — I132 Hypertensive heart and chronic kidney disease with heart failure and with stage 5 chronic kidney disease, or end stage renal disease: Secondary | ICD-10-CM | POA: Insufficient documentation

## 2019-10-17 DIAGNOSIS — Z7901 Long term (current) use of anticoagulants: Secondary | ICD-10-CM | POA: Insufficient documentation

## 2019-10-17 DIAGNOSIS — Z87891 Personal history of nicotine dependence: Secondary | ICD-10-CM | POA: Insufficient documentation

## 2019-10-17 LAB — CBC
HCT: 37.1 % (ref 36.0–46.0)
Hemoglobin: 11.3 g/dL — ABNORMAL LOW (ref 12.0–15.0)
MCH: 33.9 pg (ref 26.0–34.0)
MCHC: 30.5 g/dL (ref 30.0–36.0)
MCV: 111.4 fL — ABNORMAL HIGH (ref 80.0–100.0)
Platelets: 207 10*3/uL (ref 150–400)
RBC: 3.33 MIL/uL — ABNORMAL LOW (ref 3.87–5.11)
RDW: 16.1 % — ABNORMAL HIGH (ref 11.5–15.5)
WBC: 9.1 10*3/uL (ref 4.0–10.5)
nRBC: 0.2 % (ref 0.0–0.2)

## 2019-10-17 LAB — BASIC METABOLIC PANEL
Anion gap: 16 — ABNORMAL HIGH (ref 5–15)
BUN: 39 mg/dL — ABNORMAL HIGH (ref 8–23)
CO2: 31 mmol/L (ref 22–32)
Calcium: 10 mg/dL (ref 8.9–10.3)
Chloride: 93 mmol/L — ABNORMAL LOW (ref 98–111)
Creatinine, Ser: 8.18 mg/dL — ABNORMAL HIGH (ref 0.44–1.00)
GFR calc Af Amer: 5 mL/min — ABNORMAL LOW (ref >60)
GFR calc non Af Amer: 5 mL/min — ABNORMAL LOW (ref >60)
Glucose, Bld: 150 mg/dL — ABNORMAL HIGH (ref 70–99)
Potassium: 5.2 mmol/L — ABNORMAL HIGH (ref 3.5–5.1)
Sodium: 140 mmol/L (ref 135–145)

## 2019-10-17 MED ORDER — SODIUM CHLORIDE 0.9% FLUSH
3.0000 mL | Freq: Once | INTRAVENOUS | Status: AC
  Administered 2019-10-18: 3 mL via INTRAVENOUS

## 2019-10-17 NOTE — ED Triage Notes (Signed)
Pt comes via [facility] for tremors difficulty speaking, and generalized weakness that have been going on since 3pm yesterday, possible ETOH on board

## 2019-10-18 ENCOUNTER — Emergency Department (HOSPITAL_COMMUNITY): Payer: Medicare Other

## 2019-10-18 LAB — MAGNESIUM: Magnesium: 3.1 mg/dL — ABNORMAL HIGH (ref 1.7–2.4)

## 2019-10-18 LAB — HEPATIC FUNCTION PANEL
ALT: 14 U/L (ref 0–44)
AST: 30 U/L (ref 15–41)
Albumin: 3.9 g/dL (ref 3.5–5.0)
Alkaline Phosphatase: 91 U/L (ref 38–126)
Bilirubin, Direct: 0.2 mg/dL (ref 0.0–0.2)
Indirect Bilirubin: 0.5 mg/dL (ref 0.3–0.9)
Total Bilirubin: 0.7 mg/dL (ref 0.3–1.2)
Total Protein: 6.7 g/dL (ref 6.5–8.1)

## 2019-10-18 LAB — PROTIME-INR
INR: 1.1 (ref 0.8–1.2)
Prothrombin Time: 13.6 seconds (ref 11.4–15.2)

## 2019-10-18 LAB — ETHANOL: Alcohol, Ethyl (B): <10 mg/dL (ref <10)

## 2019-10-18 LAB — APTT: aPTT: 26 seconds (ref 24–36)

## 2019-10-18 LAB — LIPASE, BLOOD: Lipase: 19 U/L (ref 11–51)

## 2019-10-18 MED ORDER — SODIUM ZIRCONIUM CYCLOSILICATE 5 G PO PACK
5.0000 g | PACK | Freq: Once | ORAL | Status: AC
  Administered 2019-10-18: 5 g via ORAL
  Filled 2019-10-18 (×2): qty 1

## 2019-10-18 MED ORDER — ALBUTEROL SULFATE HFA 108 (90 BASE) MCG/ACT IN AERS
INHALATION_SPRAY | RESPIRATORY_TRACT | Status: AC
  Filled 2019-10-18: qty 6.7

## 2019-10-18 MED ORDER — ALBUTEROL SULFATE HFA 108 (90 BASE) MCG/ACT IN AERS
2.0000 | INHALATION_SPRAY | Freq: Once | RESPIRATORY_TRACT | Status: AC
  Administered 2019-10-18: 2 via RESPIRATORY_TRACT

## 2019-10-18 MED ORDER — IPRATROPIUM BROMIDE HFA 17 MCG/ACT IN AERS
2.0000 | INHALATION_SPRAY | Freq: Once | RESPIRATORY_TRACT | Status: DC
  Filled 2019-10-18: qty 12.9

## 2019-10-18 NOTE — Discharge Instructions (Addendum)
As discussed, arrangements have been made for you to have dialysis tomorrow. Your ride, [employer] will be picking up about 1030 tomorrow for a noon appointment.  Return here for concerning changes in your condition.

## 2019-10-18 NOTE — ED Notes (Signed)
[facility] here to take patient home 

## 2019-10-18 NOTE — Progress Notes (Signed)
Patient cleared for discharge from the ED per Dr. Vanita Panda. Patient missed her regularly scheduled OP HD appointment today. Renal Navigator has rescheduled appointment for OP HD at [facility] clinic for tomorrow, 10/19/19 at 12:00pm for a 12:15pm seat. Renal Navigator contacted [employer] to alert them to need for transport and contacted [employer] for [employer]. [employer] will call patient to notify her of pick up time. Renal Navigator informed EDP/Dr. Vanita Panda of OP HD appointment and transportation information to relay to patient.  Alphonzo Cruise, [place] Renal Navigator [phone]

## 2019-10-18 NOTE — ED Notes (Signed)
[facility] called  

## 2019-10-18 NOTE — ED Notes (Signed)
Patient and her SO updated via telephone. Aware that pt will be discharged now. Aware that [facility] will pick her up tomorrow AM 1030 for dialysis scheduled at 1200. Patient and SO verbalized understanding.

## 2019-10-18 NOTE — ED Provider Notes (Signed)
[facility] EMERGENCY DEPARTMENT Provider Note   CSN: [id number] Arrival date & time: 10/17/19  2224     History   Chief Complaint No chief complaint on file.   HPI Kari Robinson is a 68 y.o. female.     HPI  Patient presents with concern of word finding difficulty, and shakiness. She knowledges multiple medical problems, states that she takes her medication as directed, and went to her last dialysis session as scheduled 2 days ago. Yesterday, about 18 hours ago she began having word finding difficulty, increasing shakiness in both lower extremities, and her arms. She notes that she is typically able to ambulate without difficulty, but has been able to do so since that time. Since onset symptoms have actually improved, particularly in her legs, with much less shaking, but with persistency of word finding difficulty she presents for evaluation. She denies overt confusion, states that she is having difficulty finding words, expressing her thoughts. She denies vision changes, fever, new specific pain, vomiting, diarrhea. Since onset no clear alleviating or exacerbating factors. She notes that in addition to her multiple problems, she has known lumbar spine disease, cervical spine disease, but denies a history of similar speech finding difficulty.  Past Medical History:  Diagnosis Date   Alcohol abuse    Arthralgia    CAD (coronary artery disease)    Chronic combined systolic and diastolic HF (heart failure) ([name])    COPD (chronic obstructive pulmonary disease) ([name])    Depression    Dyspnea    ESRD (end stage renal disease) ([name])    HTN (hypertension)    Hypercholesterolemia    primarily ldl-p and small particles   Lung cancer [facility])    Patient reports this with surgery.  No details.     RAS (renal artery stenosis) ([place]) 2002   by cath tysinger   Smoking     Patient Active Problem List   Diagnosis Date Noted   Pneumonia 07/24/2019    Dyspnea 07/24/2019   Lumbar stenosis 05/09/2019   Leg weakness, bilateral 04/01/2019   Tobacco abuse 04/01/2019   Alcohol abuse 04/01/2019   ESRD (end stage renal disease) on dialysis ([place]) 02/24/2019   Seizure-like activity ([place]) 02/24/2019   Ischemic cardiomyopathy 02/24/2019   Type II diabetes mellitus with renal manifestations ([place]) 09/30/2018   HLD (hyperlipidemia) 09/30/2018   Depression with anxiety 09/30/2018   Anemia of chronic disease 09/30/2018   COPD (chronic obstructive pulmonary disease) ([place])    Solitary right kidney 62/13/0865   Chronic systolic CHF (congestive heart failure) ([place]) 06/24/2018   CKD (chronic kidney disease) stage 5, GFR less than 15 ml/min ([name]) 06/07/2018   Normocytic normochromic anemia 06/07/2018   DM (diabetes mellitus), type 2 with renal complications ([place]) 78/46/9629   Occlusion and stenosis of carotid artery without mention of cerebral infarction 01/19/2012   RENAL ARTERY STENOSIS 09/10/2010   DEPRESSION 08/11/2010   PERIPHERAL VASCULAR DISEASE 08/11/2010   Essential hypertension 08/08/2010   Coronary atherosclerosis 08/08/2010    Past Surgical History:  Procedure Laterality Date   abdominal aortogram     perclose of the right femoral artery   AV FISTULA PLACEMENT Right 07/11/2018   Procedure: Right arm ARTERIOVENOUS FISTULA CREATION;  Surgeon: [name], MD;  Location: [facility];  Service: Vascular;  Laterality: Right;   AVF placement Right    CARDIAC CATHETERIZATION     left heart catheterization.  Coronary cineangiography. Lft ventricular cineangiography.     LUNG SURGERY  due to lung cancer per patient's report   TUBAL LIGATION       OB History   No obstetric history on file.      Home Medications    Prior to Admission medications   Medication Sig Start Date End Date Taking? Authorizing Provider  albuterol (PROVENTIL HFA;VENTOLIN HFA) 108 (90 Base) MCG/ACT inhaler Inhale 2 puffs into the  lungs every 4 (four) hours as needed for wheezing or shortness of breath.  11/25/17  Yes [provider]  aspirin EC 81 MG tablet Take 81 mg by mouth daily.   Yes [provider]  atorvastatin (LIPITOR) 40 MG tablet Take 40 mg by mouth daily.   Yes [provider]  AURYXIA 1 GM 210 MG(Fe) tablet Take 420-630 mg by mouth See admin instructions. Take 3 tablets (630 mg) by mouth three times daily with meals and 2 tablets (420 mg) by mouth with each snack 04/06/19  Yes [provider]  b complex-vitamin c-folic acid (NEPHRO-VITE) 0.8 MG TABS tablet Take 1 tablet by mouth See admin instructions. Take 1 tablet by mouth in the morning on Sun/Tues/Thurs/Sat and 1 tablet after dialysis on Mon/Wed/Fri 02/06/19  Yes [provider]  budesonide-formoterol (SYMBICORT) 160-4.5 MCG/ACT inhaler Inhale 2 puffs into the lungs 2 (two) times daily.   Yes [provider]  buPROPion (WELLBUTRIN SR) 150 MG 12 hr tablet Take 150 mg by mouth 2 (two) times daily.   Yes [provider]  cinacalcet (SENSIPAR) 30 MG tablet Take 1 tablet (30 mg total) by mouth every Monday, Wednesday, and Friday at 6 PM. 05/12/19  Yes Ogbata, Babs Bertin, MD  clopidogrel (PLAVIX) 75 MG tablet Take 75 mg by mouth daily.   Yes [provider]  Fluticasone-Salmeterol (ADVAIR) 500-50 MCG/DOSE AEPB Inhale 1 puff into the lungs 2 (two) times daily.   Yes [provider]  ipratropium-albuterol (DUONEB) 0.5-2.5 (3) MG/3ML SOLN Take 3 mLs by nebulization every 6 (six) hours as needed. Patient taking differently: Take 3 mLs by nebulization every 6 (six) hours as needed (for wheezing or shortness of breath).  05/11/19  Yes Dana Allan I, MD  lidocaine-prilocaine (EMLA) cream Apply 1 application topically every Monday, Wednesday, and Friday with hemodialysis. Prior to dialysis 04/06/19  Yes [provider]  metoprolol tartrate (LOPRESSOR) 25 MG tablet Take 25 mg by mouth  daily. 09/27/19  Yes [provider]  OXYGEN Inhale 3 L/min into the lungs continuous.    Yes [provider]  QUEtiapine (SEROQUEL) 200 MG tablet Take 100-200 mg by mouth at bedtime. 09/20/19  Yes [provider]  tiotropium (SPIRIVA HANDIHALER) 18 MCG inhalation capsule Place 1 capsule (18 mcg total) into inhaler and inhale daily. 05/11/19 05/10/20 Yes Bonnell Public, MD  Amino Acids-Protein Hydrolys (FEEDING SUPPLEMENT, PRO-STAT SUGAR FREE 64,) LIQD Take 30 mLs by mouth 2 (two) times daily between meals. Patient not taking: Reported on 07/23/2019 07/11/19   Ladona Horns, MD  metoprolol succinate (TOPROL-XL) 25 MG 24 hr tablet Take 1 tablet (25 mg total) by mouth daily. Take with or immediately following a meal. Patient not taking: Reported on 10/18/2019 07/06/19   Ladona Horns, MD    Family History Family History  Problem Relation Age of Onset   Emphysema Father    Cancer Brother     Social History Social History   Tobacco Use   Smoking status: Former Smoker    Years: 35.00    Types: Cigarettes    Quit date: 05/16/2019  Years since quitting: 0.4   Smokeless tobacco: Never Used   Tobacco comment: 3-4 cigarettes per day  Substance Use Topics   Alcohol use: Not Currently    Frequency: Never    Comment: states has quit drinking "a few months ago"   Drug use: Never     Allergies   Citalopram hydrobromide and Morphine and related   Review of Systems Review of Systems  Constitutional:       Per HPI, otherwise negative  HENT:       Per HPI, otherwise negative  Respiratory:       Per HPI, otherwise negative  Cardiovascular:       Per HPI, otherwise negative  Gastrointestinal: Negative for vomiting.  Endocrine:       Negative aside from HPI  Genitourinary:       Neg aside from HPI   Musculoskeletal:       Per HPI, otherwise negative  Skin: Negative.   Allergic/Immunologic: Positive for immunocompromised state.  Neurological: Positive  for tremors, speech difficulty and weakness. Negative for syncope, facial asymmetry and headaches.     Physical Exam Updated Vital Signs BP (!) 160/92    Pulse 97    Temp 97.7 F (36.5 C) (Oral)    Resp 13    SpO2 100%   Physical Exam Vitals signs and nursing note reviewed.  Constitutional:      General: She is not in acute distress.    Appearance: She is well-developed.  HENT:     Head: Normocephalic and atraumatic.  Eyes:     Conjunctiva/sclera: Conjunctivae normal.  Cardiovascular:     Rate and Rhythm: Normal rate and regular rhythm.  Pulmonary:     Effort: Pulmonary effort is normal. No respiratory distress.     Breath sounds: Normal breath sounds. No stridor.  Abdominal:     General: There is no distension.  Skin:    General: Skin is warm and dry.  Neurological:     Mental Status: She is alert and oriented to person, place, and time.     Cranial Nerves: Dysarthria present. No cranial nerve deficit.     Motor: Tremor present.     Comments: Moves all 4 extremities spontaneously, and to command, has no gross strength deficiencies/asymmetry. There is intermittent tremor, inconsistent in all 4 extremities greater in the lower. No gross coordination deficits. No facial asymmetry.       ED Treatments / Results  Labs (all labs ordered are listed, but only abnormal results are displayed) Labs Reviewed  BASIC METABOLIC PANEL - Abnormal; Notable for the following components:      Result Value   Potassium 5.2 (*)    Chloride 93 (*)    Glucose, Bld 150 (*)    BUN 39 (*)    Creatinine, Ser 8.18 (*)    GFR calc non Af Amer 5 (*)    GFR calc Af Amer 5 (*)    Anion gap 16 (*)    All other components within normal limits  CBC - Abnormal; Notable for the following components:   RBC 3.33 (*)    Hemoglobin 11.3 (*)    MCV 111.4 (*)    RDW 16.1 (*)    All other components within normal limits  MAGNESIUM - Abnormal; Notable for the following components:   Magnesium 3.1 (*)     All other components within normal limits  PROTIME-INR  APTT  ETHANOL  HEPATIC FUNCTION PANEL  LIPASE, BLOOD  URINALYSIS, ROUTINE  W REFLEX MICROSCOPIC  CBG MONITORING, ED    EKG EKG Interpretation  Date/Time:  Tuesday October 17 2019 22:32:35 EST Ventricular Rate:  87 PR Interval:  166 QRS Duration: 100 QT Interval:  430 QTC Calculation: 517 R Axis:   72 Text Interpretation: Normal sinus rhythm Biatrial enlargement Left ventricular hypertrophy with repolarization abnormality ( Cornell product ) Prolonged QT Abnormal ECG When compared with ECG of 08/07/2019, QT has lengthened Confirmed by Delora Fuel ([id number]) on 10/18/2019 12:03:40 AM   Radiology Mr Brain Wo Contrast (neuro Protocol)  Result Date: 10/18/2019 CLINICAL DATA:  Focal neuro deficit, greater than 6 hours, stroke suspected. New aphasia since yesterday. Additional provided: Tremors and difficulty speaking which generalized weakness. EXAM: MRI HEAD WITHOUT CONTRAST TECHNIQUE: Multiplanar, multiecho pulse sequences of the brain and surrounding structures were obtained without intravenous contrast. COMPARISON:  Report from brain MRI 04/01/2019 (images currently unavailable), brain MRI 02/25/2019, head CT 02/24/2019 FINDINGS: Brain: Punctate focus of subtle apparent restricted diffusion within the right frontal lobe white matter (seen only on axial diffusion-weighted imaging)(series 2, image 38). This may reflect image noise or a tiny early subacute infarct. No evidence of recent infarct elsewhere within the brain. No evidence of intracranial mass. No midline shift or extra-axial fluid collection. Again demonstrated is a large region of encephalomalacia and associated chronic blood products within the right frontal lobe and right basal ganglia, reflecting a chronic hemorrhagic infarct. Redemonstrated chronic lacunar infarct within the left corona radiata. Background of moderate chronic small vessel ischemic disease. Cerebral volume  is normal for age. Vascular: Lack of expected flow void within the intracranial left vertebral artery, unchanged from prior exams and likely reflecting chronic occlusion. Flow voids otherwise preserved within the proximal large arterial vessels. Skull and upper cervical spine: No focal marrow lesion Sinuses/Orbits: Visualized orbits demonstrate no acute abnormality. Minimal ethmoid sinus mucosal thickening. Trace fluid within right mastoid air cells. IMPRESSION: 1. Image noise artifact versus punctate early subacute infarct within the right frontal lobe white matter. 2. 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 3. Moderate chronic small vessel ischemic disease. 4. 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 Electronically Signed   By: Kellie Simmering DO   On: 10/18/2019 11:48    Procedures Procedures (including critical care time)  Medications Ordered in ED Medications  sodium chloride flush (NS) 0.9 % injection 3 mL (3 mLs Intravenous Given 10/18/19 0848)  sodium zirconium cyclosilicate (LOKELMA) packet 5 g (5 g Oral Given 10/18/19 1220)  albuterol (VENTOLIN HFA) 108 (90 Base) MCG/ACT inhaler 2 puff (2 puffs Inhalation Given 10/18/19 1507)     Initial Impression / Assessment and Plan / ED Course  I have reviewed the triage vital signs and the nursing notes.  Pertinent labs & imaging results that were available during my care of the patient were reviewed by me and considered in my medical decision making (see chart for details).    Chart review notable for 10 prior ED visits in the past 6 months , MRI scans performed 6 months ago, and dialysis was scheduled for today.  Update: Patient in no distress, initial labs notable for hyperkalemia.  3:33 PM Patient has received Lokelma and albuterol for hyperkalemia.  On she is aware of all findings including reassuring MRI, which I have discussed with our  neurology colleagues. No evidence for acute changes, little suspicion for stroke, and given the patient's description of aphasia is a new symptom, no corresponding  anatomical lesion visible on MRI today. With this reassuring findings, no evidence for acute new stroke, reassuring labs, vitals, the patient is appropriate for discharge.  She does have mild hyperkalemia which received therapy here. With consideration for dialysis schedule discussed her case with our social work renal navigator, who assisted with confirming dialysis at noon tomorrow, transportation to it attend as well. Patient discharged in stable condition.  Final Clinical Impressions(s) / ED Diagnoses   Final diagnoses:  Hyperkalemia  Aphasia     Carmin Muskrat, MD 10/18/19 1540

## 2019-10-18 NOTE — ED Notes (Signed)
Pt transported to MRI 

## 2019-10-19 ENCOUNTER — Inpatient Hospital Stay (HOSPITAL_COMMUNITY)
Admission: EM | Admit: 2019-10-19 | Discharge: 2019-10-24 | DRG: 291 | Disposition: A | Discharge status: Home Health | Payer: Medicare Other | Attending: Internal Medicine | Admitting: Internal Medicine

## 2019-10-19 ENCOUNTER — Encounter (HOSPITAL_COMMUNITY): Payer: Self-pay

## 2019-10-19 ENCOUNTER — Other Ambulatory Visit: Payer: Self-pay

## 2019-10-19 ENCOUNTER — Emergency Department (HOSPITAL_COMMUNITY): Payer: Medicare Other

## 2019-10-19 DIAGNOSIS — Z720 Tobacco use: Secondary | ICD-10-CM

## 2019-10-19 DIAGNOSIS — I2510 Atherosclerotic heart disease of native coronary artery without angina pectoris: Secondary | ICD-10-CM | POA: Diagnosis present

## 2019-10-19 DIAGNOSIS — I5043 Acute on chronic combined systolic (congestive) and diastolic (congestive) heart failure: Secondary | ICD-10-CM | POA: Diagnosis present

## 2019-10-19 DIAGNOSIS — Z7951 Long term (current) use of inhaled steroids: Secondary | ICD-10-CM

## 2019-10-19 DIAGNOSIS — I132 Hypertensive heart and chronic kidney disease with heart failure and with stage 5 chronic kidney disease, or end stage renal disease: Secondary | ICD-10-CM | POA: Diagnosis not present

## 2019-10-19 DIAGNOSIS — J9611 Chronic respiratory failure with hypoxia: Secondary | ICD-10-CM | POA: Diagnosis present

## 2019-10-19 DIAGNOSIS — R778 Other specified abnormalities of plasma proteins: Secondary | ICD-10-CM | POA: Diagnosis present

## 2019-10-19 DIAGNOSIS — Z20828 Contact with and (suspected) exposure to other viral communicable diseases: Secondary | ICD-10-CM | POA: Diagnosis present

## 2019-10-19 DIAGNOSIS — R0602 Shortness of breath: Secondary | ICD-10-CM | POA: Diagnosis not present

## 2019-10-19 DIAGNOSIS — Z955 Presence of coronary angioplasty implant and graft: Secondary | ICD-10-CM

## 2019-10-19 DIAGNOSIS — Z85118 Personal history of other malignant neoplasm of bronchus and lung: Secondary | ICD-10-CM

## 2019-10-19 DIAGNOSIS — R471 Dysarthria and anarthria: Secondary | ICD-10-CM | POA: Diagnosis present

## 2019-10-19 DIAGNOSIS — R4701 Aphasia: Secondary | ICD-10-CM | POA: Diagnosis present

## 2019-10-19 DIAGNOSIS — Z902 Acquired absence of lung [part of]: Secondary | ICD-10-CM

## 2019-10-19 DIAGNOSIS — Z716 Tobacco abuse counseling: Secondary | ICD-10-CM

## 2019-10-19 DIAGNOSIS — Z79899 Other long term (current) drug therapy: Secondary | ICD-10-CM

## 2019-10-19 DIAGNOSIS — N2581 Secondary hyperparathyroidism of renal origin: Secondary | ICD-10-CM | POA: Diagnosis present

## 2019-10-19 DIAGNOSIS — E8889 Other specified metabolic disorders: Secondary | ICD-10-CM | POA: Diagnosis present

## 2019-10-19 DIAGNOSIS — E1122 Type 2 diabetes mellitus with diabetic chronic kidney disease: Secondary | ICD-10-CM | POA: Diagnosis present

## 2019-10-19 DIAGNOSIS — I248 Other forms of acute ischemic heart disease: Secondary | ICD-10-CM | POA: Diagnosis present

## 2019-10-19 DIAGNOSIS — I252 Old myocardial infarction: Secondary | ICD-10-CM

## 2019-10-19 DIAGNOSIS — E875 Hyperkalemia: Secondary | ICD-10-CM | POA: Diagnosis present

## 2019-10-19 DIAGNOSIS — I10 Essential (primary) hypertension: Secondary | ICD-10-CM

## 2019-10-19 DIAGNOSIS — Z888 Allergy status to other drugs, medicaments and biological substances status: Secondary | ICD-10-CM

## 2019-10-19 DIAGNOSIS — I255 Ischemic cardiomyopathy: Secondary | ICD-10-CM | POA: Diagnosis present

## 2019-10-19 DIAGNOSIS — R079 Chest pain, unspecified: Secondary | ICD-10-CM | POA: Diagnosis not present

## 2019-10-19 DIAGNOSIS — Z992 Dependence on renal dialysis: Secondary | ICD-10-CM

## 2019-10-19 DIAGNOSIS — E1151 Type 2 diabetes mellitus with diabetic peripheral angiopathy without gangrene: Secondary | ICD-10-CM | POA: Diagnosis present

## 2019-10-19 DIAGNOSIS — D849 Immunodeficiency, unspecified: Secondary | ICD-10-CM | POA: Diagnosis present

## 2019-10-19 DIAGNOSIS — J449 Chronic obstructive pulmonary disease, unspecified: Secondary | ICD-10-CM | POA: Diagnosis present

## 2019-10-19 DIAGNOSIS — I5022 Chronic systolic (congestive) heart failure: Secondary | ICD-10-CM

## 2019-10-19 DIAGNOSIS — F1010 Alcohol abuse, uncomplicated: Secondary | ICD-10-CM | POA: Diagnosis present

## 2019-10-19 DIAGNOSIS — E785 Hyperlipidemia, unspecified: Secondary | ICD-10-CM | POA: Diagnosis present

## 2019-10-19 DIAGNOSIS — E1129 Type 2 diabetes mellitus with other diabetic kidney complication: Secondary | ICD-10-CM | POA: Diagnosis present

## 2019-10-19 DIAGNOSIS — D631 Anemia in chronic kidney disease: Secondary | ICD-10-CM | POA: Diagnosis present

## 2019-10-19 DIAGNOSIS — E1121 Type 2 diabetes mellitus with diabetic nephropathy: Secondary | ICD-10-CM

## 2019-10-19 DIAGNOSIS — N186 End stage renal disease: Secondary | ICD-10-CM | POA: Diagnosis not present

## 2019-10-19 DIAGNOSIS — Z7902 Long term (current) use of antithrombotics/antiplatelets: Secondary | ICD-10-CM

## 2019-10-19 DIAGNOSIS — Z7982 Long term (current) use of aspirin: Secondary | ICD-10-CM

## 2019-10-19 DIAGNOSIS — Z825 Family history of asthma and other chronic lower respiratory diseases: Secondary | ICD-10-CM

## 2019-10-19 DIAGNOSIS — Z9981 Dependence on supplemental oxygen: Secondary | ICD-10-CM

## 2019-10-19 DIAGNOSIS — J441 Chronic obstructive pulmonary disease with (acute) exacerbation: Secondary | ICD-10-CM | POA: Diagnosis present

## 2019-10-19 DIAGNOSIS — N185 Chronic kidney disease, stage 5: Secondary | ICD-10-CM

## 2019-10-19 DIAGNOSIS — F418 Other specified anxiety disorders: Secondary | ICD-10-CM | POA: Diagnosis present

## 2019-10-19 DIAGNOSIS — Q600 Renal agenesis, unilateral: Secondary | ICD-10-CM

## 2019-10-19 DIAGNOSIS — Z885 Allergy status to narcotic agent status: Secondary | ICD-10-CM

## 2019-10-19 DIAGNOSIS — R531 Weakness: Secondary | ICD-10-CM

## 2019-10-19 LAB — CBC WITH DIFFERENTIAL/PLATELET
Abs Immature Granulocytes: 0.08 10*3/uL — ABNORMAL HIGH (ref 0.00–0.07)
Basophils Absolute: 0 10*3/uL (ref 0.0–0.1)
Basophils Relative: 0 %
Eosinophils Absolute: 0.2 10*3/uL (ref 0.0–0.5)
Eosinophils Relative: 2 %
HCT: 32.2 % — ABNORMAL LOW (ref 36.0–46.0)
Hemoglobin: 10 g/dL — ABNORMAL LOW (ref 12.0–15.0)
Immature Granulocytes: 1 %
Lymphocytes Relative: 4 %
Lymphs Abs: 0.4 10*3/uL — ABNORMAL LOW (ref 0.7–4.0)
MCH: 34.2 pg — ABNORMAL HIGH (ref 26.0–34.0)
MCHC: 31.1 g/dL (ref 30.0–36.0)
MCV: 110.3 fL — ABNORMAL HIGH (ref 80.0–100.0)
Monocytes Absolute: 0.8 10*3/uL (ref 0.1–1.0)
Monocytes Relative: 7 %
Neutro Abs: 10.5 10*3/uL — ABNORMAL HIGH (ref 1.7–7.7)
Neutrophils Relative %: 86 %
Platelets: 156 10*3/uL (ref 150–400)
RBC: 2.92 MIL/uL — ABNORMAL LOW (ref 3.87–5.11)
RDW: 16.1 % — ABNORMAL HIGH (ref 11.5–15.5)
WBC: 12 10*3/uL — ABNORMAL HIGH (ref 4.0–10.5)
nRBC: 0.2 % (ref 0.0–0.2)

## 2019-10-19 LAB — GLUCOSE, CAPILLARY: Glucose-Capillary: 105 mg/dL — ABNORMAL HIGH (ref 70–99)

## 2019-10-19 LAB — BASIC METABOLIC PANEL
Anion gap: 14 (ref 5–15)
BUN: 58 mg/dL — ABNORMAL HIGH (ref 8–23)
CO2: 30 mmol/L (ref 22–32)
Calcium: 8.9 mg/dL (ref 8.9–10.3)
Chloride: 94 mmol/L — ABNORMAL LOW (ref 98–111)
Creatinine, Ser: 10.48 mg/dL — ABNORMAL HIGH (ref 0.44–1.00)
GFR calc Af Amer: 4 mL/min — ABNORMAL LOW (ref >60)
GFR calc non Af Amer: 3 mL/min — ABNORMAL LOW (ref >60)
Glucose, Bld: 139 mg/dL — ABNORMAL HIGH (ref 70–99)
Potassium: 5.2 mmol/L — ABNORMAL HIGH (ref 3.5–5.1)
Sodium: 138 mmol/L (ref 135–145)

## 2019-10-19 LAB — TROPONIN I (HIGH SENSITIVITY)
Troponin I (High Sensitivity): 80 ng/L — ABNORMAL HIGH (ref <18)
Troponin I (High Sensitivity): 96 ng/L — ABNORMAL HIGH (ref <18)

## 2019-10-19 LAB — SARS CORONAVIRUS 2 (TAT 6-24 HRS): SARS Coronavirus 2: NEGATIVE

## 2019-10-19 MED ORDER — METOPROLOL SUCCINATE ER 50 MG PO TB24
50.0000 mg | ORAL_TABLET | Freq: Every day | ORAL | Status: DC
  Administered 2019-10-19 – 2019-10-24 (×5): 50 mg via ORAL
  Filled 2019-10-19: qty 2
  Filled 2019-10-19 (×4): qty 1

## 2019-10-19 MED ORDER — CHLORHEXIDINE GLUCONATE CLOTH 2 % EX PADS: 6.0000 | MEDICATED_PAD | Freq: Every day | CUTANEOUS | Status: DC

## 2019-10-19 MED ORDER — ACETAMINOPHEN 325 MG PO TABS: 650.0000 mg | ORAL_TABLET | ORAL | Status: DC | PRN

## 2019-10-19 MED ORDER — FERRIC CITRATE 1 GM 210 MG(FE) PO TABS
630.0000 mg | ORAL_TABLET | Freq: Three times a day (TID) | ORAL | Status: DC
  Administered 2019-10-20 – 2019-10-24 (×11): 630 mg via ORAL
  Filled 2019-10-19 (×17): qty 3

## 2019-10-19 MED ORDER — LIDOCAINE-PRILOCAINE 2.5-2.5 % EX CREA
1.0000 "application " | TOPICAL_CREAM | CUTANEOUS | Status: DC
  Filled 2019-10-19: qty 5

## 2019-10-19 MED ORDER — CLOPIDOGREL BISULFATE 75 MG PO TABS
75.0000 mg | ORAL_TABLET | Freq: Every day | ORAL | Status: DC
  Administered 2019-10-19 – 2019-10-24 (×6): 75 mg via ORAL
  Filled 2019-10-19 (×6): qty 1

## 2019-10-19 MED ORDER — BUPROPION HCL ER (SR) 150 MG PO TB12
150.0000 mg | ORAL_TABLET | Freq: Two times a day (BID) | ORAL | Status: DC
  Administered 2019-10-19 – 2019-10-24 (×11): 150 mg via ORAL
  Filled 2019-10-19 (×12): qty 1

## 2019-10-19 MED ORDER — PENTAFLUOROPROP-TETRAFLUOROETH EX AERO: 1.0000 "application " | INHALATION_SPRAY | CUTANEOUS | Status: DC | PRN

## 2019-10-19 MED ORDER — SODIUM CHLORIDE 0.9 % IV SOLN: 100.0000 mL | INTRAVENOUS | Status: DC | PRN

## 2019-10-19 MED ORDER — HEPARIN SODIUM (PORCINE) 1000 UNIT/ML DIALYSIS
1000.0000 [IU] | INTRAMUSCULAR | Status: DC | PRN
  Filled 2019-10-19: qty 1

## 2019-10-19 MED ORDER — ALBUTEROL SULFATE HFA 108 (90 BASE) MCG/ACT IN AERS
2.0000 | INHALATION_SPRAY | Freq: Once | RESPIRATORY_TRACT | Status: AC
  Administered 2019-10-19: 04:00:00 2 via RESPIRATORY_TRACT
  Filled 2019-10-19: qty 6.7

## 2019-10-19 MED ORDER — LIDOCAINE-PRILOCAINE 2.5-2.5 % EX CREA
1.0000 "application " | TOPICAL_CREAM | CUTANEOUS | Status: DC | PRN
  Filled 2019-10-19: qty 5

## 2019-10-19 MED ORDER — ONDANSETRON HCL 4 MG/2ML IJ SOLN: 4.0000 mg | Freq: Four times a day (QID) | INTRAMUSCULAR | Status: DC | PRN

## 2019-10-19 MED ORDER — ATORVASTATIN CALCIUM 40 MG PO TABS
40.0000 mg | ORAL_TABLET | Freq: Every day | ORAL | Status: DC
  Administered 2019-10-19 – 2019-10-24 (×6): 40 mg via ORAL
  Filled 2019-10-19 (×6): qty 1

## 2019-10-19 MED ORDER — MOMETASONE FURO-FORMOTEROL FUM 200-5 MCG/ACT IN AERO
2.0000 | INHALATION_SPRAY | Freq: Two times a day (BID) | RESPIRATORY_TRACT | Status: DC
  Administered 2019-10-19 – 2019-10-24 (×11): 2 via RESPIRATORY_TRACT
  Filled 2019-10-19: qty 8.8

## 2019-10-19 MED ORDER — FERRIC CITRATE 1 GM 210 MG(FE) PO TABS: 420.0000 mg | ORAL_TABLET | ORAL | Status: DC

## 2019-10-19 MED ORDER — CHLORHEXIDINE GLUCONATE CLOTH 2 % EX PADS
6.0000 | MEDICATED_PAD | Freq: Every day | CUTANEOUS | Status: DC
  Administered 2019-10-20 – 2019-10-22 (×3): 6 via TOPICAL

## 2019-10-19 MED ORDER — LIDOCAINE HCL (PF) 1 % IJ SOLN: 5.0000 mL | INTRAMUSCULAR | Status: DC | PRN

## 2019-10-19 MED ORDER — ASPIRIN EC 81 MG PO TBEC
81.0000 mg | DELAYED_RELEASE_TABLET | Freq: Every day | ORAL | Status: DC
  Administered 2019-10-19 – 2019-10-24 (×6): 81 mg via ORAL
  Filled 2019-10-19 (×6): qty 1

## 2019-10-19 MED ORDER — FERRIC CITRATE 1 GM 210 MG(FE) PO TABS
420.0000 mg | ORAL_TABLET | ORAL | Status: DC
  Administered 2019-10-19 – 2019-10-23 (×9): 420 mg via ORAL
  Filled 2019-10-19 (×17): qty 2

## 2019-10-19 MED ORDER — QUETIAPINE FUMARATE 25 MG PO TABS
200.0000 mg | ORAL_TABLET | Freq: Every day | ORAL | Status: DC
  Administered 2019-10-20 – 2019-10-23 (×5): 200 mg via ORAL
  Filled 2019-10-19 (×6): qty 8
  Filled 2019-10-19: qty 1

## 2019-10-19 MED ORDER — HEPARIN SODIUM (PORCINE) 5000 UNIT/ML IJ SOLN
5000.0000 [IU] | Freq: Three times a day (TID) | INTRAMUSCULAR | Status: DC
  Administered 2019-10-20 – 2019-10-23 (×10): 5000 [IU] via SUBCUTANEOUS
  Filled 2019-10-19 (×12): qty 1

## 2019-10-19 MED ORDER — ROPINIROLE HCL 0.5 MG PO TABS
0.5000 mg | ORAL_TABLET | Freq: Once | ORAL | Status: AC
  Administered 2019-10-19: 06:00:00 0.5 mg via ORAL
  Filled 2019-10-19: qty 1

## 2019-10-19 NOTE — ED Provider Notes (Signed)
Emergency Department Provider Note   I have reviewed the triage vital signs and the nursing notes.   HISTORY  Chief Complaint Shortness of Breath   HPI Kari Robinson is a 68 y.o. female who presents to the emergency department today approximately 9 hours after being discharged.  Patient states that she woke up with chest pain and shortness of breath.  She states that the chest pain is gone but she still feels a bit short of breath.  She does have a history of COPD and she states "is that what is causing this?".  She also states that she had spasming of her leg whenever she got in certain positions that she feels is still there.  She states that that is why she was in the emergency room yesterday and does not feel that it is any better.  States that last time she had it she had to be put into rehabilitation to get better.  No pain or other complaints at this time.   No other associated or modifying symptoms.    Past Medical History:  Diagnosis Date   Alcohol abuse    Arthralgia    CAD (coronary artery disease)    Chronic combined systolic and diastolic HF (heart failure) ([name])    COPD (chronic obstructive pulmonary disease) ([name])    Depression    Dyspnea    ESRD (end stage renal disease) ([name])    HTN (hypertension)    Hypercholesterolemia    primarily ldl-p and small particles   Lung cancer [facility])    Patient reports this with surgery.  No details.     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   HLD (hyperlipidemia) 09/30/2018   Depression with anxiety 09/30/2018   Anemia of chronic disease 09/30/2018   COPD (chronic obstructive pulmonary disease) ([place])    Solitary right kidney 29/93/7169   Chronic systolic CHF (congestive heart failure) ([place]) 06/24/2018   CKD (chronic kidney disease) stage 5, GFR less than 15 ml/min ([name]) 06/07/2018   Normocytic normochromic anemia 06/07/2018   DM (diabetes mellitus), type 2 with renal complications ([place]) 67/89/3810   Occlusion and stenosis of carotid artery without mention of cerebral infarction 01/19/2012   RENAL ARTERY STENOSIS 09/10/2010   DEPRESSION 08/11/2010   PERIPHERAL VASCULAR DISEASE 08/11/2010   Essential hypertension 08/08/2010   Coronary atherosclerosis 08/08/2010    Past Surgical History:  Procedure Laterality Date   abdominal aortogram     perclose of the right femoral artery   AV FISTULA PLACEMENT Right 07/11/2018   Procedure: Right arm ARTERIOVENOUS FISTULA CREATION;  Surgeon: [name], MD;  Location: [facility];  Service: Vascular;  Laterality: Right;   AVF placement Right    CARDIAC CATHETERIZATION     left heart catheterization.  Coronary cineangiography. Lft ventricular cineangiography.     LUNG SURGERY     due to lung cancer per patient's report   TUBAL LIGATION      Current Outpatient Rx  Order #: [id number] Class: Historical Med   Order #: [id number] Class: Historical Med   Order #: [id number] Class: Historical Med   Order #: [id number] Class: Historical Med   Order #: [id number] Class: Historical Med   Order #: [id number] Class: Historical Med   Order #: [id number] Class: Historical Med   Order #: [id number] Class: Historical Med   Order #: [id number] Class: Normal   Order #: [id number] Class: Historical Med   Order #: [id number] Class: Historical Med   Order #: [id number] Class: Historical Med   Order #: [id number] Class: Historical Med   Order #:  [id number] Class: Historical Med   Order #: [id number] Class: No Print   Order #: [id number] Class: Normal   Order #: [id number] Class: Historical Med   Order #: [id number] Class: Normal   Order #: [id number] Class: Normal    Allergies Citalopram hydrobromide, Morphine and related, and Tape  Family History  Problem Relation Age of Onset   Emphysema Father    Cancer Brother     Social History Social History   Tobacco Use   Smoking status: Former Smoker    Years: 35.00    Types: Cigarettes    Quit date: 05/16/2019    Years since quitting: 0.4   Smokeless tobacco: Never Used   Tobacco comment: 3-4 cigarettes per day  Substance Use Topics   Alcohol use: Not Currently    Frequency: Never    Comment: states has quit drinking "a few months ago"   Drug use: Never    Review of Systems  All other systems negative except as documented in the HPI. All pertinent positives and negatives as reviewed in the HPI. ____________________________________________   PHYSICAL EXAM:  VITAL SIGNS: Vitals:   10/19/19 0345 10/19/19 0400 10/19/19 0440 10/19/19 0445  BP: (!) 145/89 (!) 143/91 131/88 139/88  Pulse: 94  93 93  Resp: (!) 24 (!) 23 19 14   SpO2: 100%  97% 96%    Constitutional: Alert and oriented. Well appearing and in no acute distress. Eyes: Conjunctivae are normal. PERRL. EOMI. Head: Atraumatic. Nose: No congestion/rhinnorhea. Mouth/Throat: Mucous membranes are moist.  Oropharynx non-erythematous. Neck: No stridor.  No meningeal signs.   Cardiovascular: Normal rate, regular rhythm. Good peripheral circulation. Grossly normal heart sounds.   Respiratory: Normal respiratory effort.  No retractions. Lungs CTAB. Gastrointestinal: Soft and nontender. No distention.  Musculoskeletal: No lower extremity tenderness nor edema. No gross deformities of extremities. Neurologic:  Normal speech and language. No gross focal neurologic deficits are appreciated.  Skin:  Skin is warm, dry  and intact. No rash noted.  ____________________________________________   LABS (all labs ordered are listed, but only abnormal results are displayed)  Labs Reviewed  CBC WITH DIFFERENTIAL/PLATELET - Abnormal; Notable for the following components:      Result Value   WBC 12.0 (*)    RBC 2.92 (*)    Hemoglobin 10.0 (*)    HCT 32.2 (*)    MCV 110.3 (*)    MCH 34.2 (*)    RDW 16.1 (*)    Neutro Abs 10.5 (*)    Lymphs Abs 0.4 (*)    Abs Immature Granulocytes 0.08 (*)    All other components within normal limits  BASIC METABOLIC PANEL - Abnormal; Notable for the following components:   Potassium 5.2 (*)    Chloride 94 (*)    Glucose, Bld 139 (*)    BUN 58 (*)    Creatinine, Ser 10.48 (*)    GFR calc non Af Amer 3 (*)    GFR calc Af Amer 4 (*)  All other components within normal limits  TROPONIN I (HIGH SENSITIVITY) - Abnormal; Notable for the following components:   Troponin I (High Sensitivity) 80 (*)    All other components within normal limits  TROPONIN I (HIGH SENSITIVITY) - Abnormal; Notable for the following components:   Troponin I (High Sensitivity) 96 (*)    All other components within normal limits  SARS CORONAVIRUS 2 (TAT 6-24 HRS)   ____________________________________________  EKG   EKG Interpretation  Date/Time:  Thursday October 19 2019 02:00:25 EST Ventricular Rate:  106 PR Interval:    QRS Duration: 105 QT Interval:  332 QTC Calculation: 441 R Axis:   71 Text Interpretation: Sinus tachycardia Probable left atrial enlargement LVH with secondary repolarization abnormality ST depression, consider ischemia, diffuse lds repol changes similar to most recent in MUSE given lead placement Confirmed by Merrily Pew [phone]) on 10/19/2019 2:02:56 AM       ____________________________________________  RADIOLOGY  Mr Brain Wo Contrast (neuro Protocol)  Result Date: 10/18/2019 CLINICAL DATA:  Focal neuro deficit, greater than 6 hours, stroke suspected. New  aphasia since yesterday. Additional provided: Tremors and difficulty speaking which generalized weakness. EXAM: MRI HEAD WITHOUT CONTRAST TECHNIQUE: Multiplanar, multiecho pulse sequences of the brain and surrounding structures were obtained without intravenous contrast. COMPARISON:  Report from brain MRI 04/01/2019 (images currently unavailable), brain MRI 02/25/2019, head CT 02/24/2019 FINDINGS: Brain: Punctate focus of subtle apparent restricted diffusion within the right frontal lobe white matter (seen only on axial diffusion-weighted imaging)(series 2, image 38). This may reflect image noise or a tiny early subacute infarct. No evidence of recent infarct elsewhere within the brain. No evidence of intracranial mass. No midline shift or extra-axial fluid collection. Again demonstrated is a large region of encephalomalacia and associated chronic blood products within the right frontal lobe and right basal ganglia, reflecting a chronic hemorrhagic infarct. Redemonstrated chronic lacunar infarct within the left corona radiata. Background of moderate chronic small vessel ischemic disease. Cerebral volume is normal for age. Vascular: Lack of expected flow void within the intracranial left vertebral artery, unchanged from prior exams and likely reflecting chronic occlusion. Flow voids otherwise preserved within the proximal large arterial vessels. Skull and upper cervical spine: No focal marrow lesion Sinuses/Orbits: Visualized orbits demonstrate no acute abnormality. Minimal ethmoid sinus mucosal thickening. Trace fluid within right mastoid air cells. IMPRESSION: 1. Image noise artifact versus punctate early subacute infarct within the right frontal lobe white matter. 2. 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 3. Moderate chronic small vessel ischemic disease. 4. 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  Electronically Signed   By: Kellie Simmering DO   On: 10/18/2019 11:48   Dg Chest Portable 1 View  Result Date: 10/19/2019 CLINICAL DATA:  Shortness of breath EXAM: PORTABLE CHEST 1 VIEW COMPARISON:  08/07/2019 FINDINGS: There is hyperinflation of the lungs compatible with COPD. Cardiomegaly. Aortic atherosclerosis. No confluent airspace opacities, effusions or edema. No acute bony abnormality. IMPRESSION: Cardiomegaly, COPD.  No acute findings. Electronically Signed   By: Rolm Baptise M.D.   On: 10/19/2019 02:55    ____________________________________________   PROCEDURES  Procedure(s) performed:   Procedures   ____________________________________________   INITIAL IMPRESSION / ASSESSMENT AND PLAN / ED COURSE  Unclear on etiology of symptoms (as far as legs go, could be restless legs), will try requip.   Cp/sob - seem improved now. Does have h/o cad, ecg unchanged. Will trend troponins and get ecg.  Recheck electrolytes.   Workup as above, troponins slightly above baseline with increase of 16 over a couple hours, ecg with some mild changes but could be baseline depending on lead placement. Will d/w cardiology but should probably be observed by medicine.  Discussed with Dr. Terrence Dupont who states since [facility] has seen her in the hospital, they should be consulted, paged them.   Discussed with Melina Copa with cardiology to ask for consult, will be done.   Discussed with Dr. Hal Hope who will admit.   Pertinent labs & imaging results that were available during my care of the patient were reviewed by me and considered in my medical decision making (see chart for details).  ____________________________________________  FINAL CLINICAL IMPRESSION(S) / ED DIAGNOSES  Final diagnoses:  None     MEDICATIONS GIVEN DURING THIS VISIT:  Medications  rOPINIRole (REQUIP) tablet 0.5 mg (0.5 mg Oral Given 10/19/19 0557)  albuterol (VENTOLIN HFA) 108 (90 Base) MCG/ACT inhaler 2 puff (2 puffs  Inhalation Given 10/19/19 0332)     NEW OUTPATIENT MEDICATIONS STARTED DURING THIS VISIT:  New Prescriptions   No medications on file    Note:  This note was prepared with assistance of Dragon voice recognition software. Occasional wrong-word or sound-a-like substitutions may have occurred due to the inherent limitations of voice recognition software.   Merrily Pew, MD 10/19/19 [phone]

## 2019-10-19 NOTE — Consult Note (Addendum)
Cardiology Consultation:   Patient ID: Kari Robinson; [id number]; [birth year]   Admit date: 10/19/2019 Date of Consult: 10/19/2019  Primary Care Provider: Tsosie Billing, MD (Inactive) Primary Cardiologist: Minus Breeding, MD 06/17/2019 (was at [facility] after moving to [facility] from [facility]) Primary Electrophysiologist:  None   Patient Profile:   Kari Robinson is a 68 y.o. female with a hx of CAD w/ stents in [place] 2005 & 2016, ICM, COPD, lung CA s/p L lung resection 2017, ERSD on HD, PAF, tob use (quit 07/2019), NSTEMI 09/2017 w/ med rx (most meds not filled), S-CHF w/ EF 25% 11/2016 improved to 45-50%06/2019, who is being seen today for the evaluation of chest pain at the request of Dr Tamala Julian.  History of Present Illness:   Ms. Kari Robinson has a chronic cough 2nd 60 years of tobacco use, recently quit. No fevers or chills.   Pt has been having problems with shaking in her feet and ankles for about 2 months. She woke up 2 days ago with shaking in her legs and arms. Some difficulty with word-finding. She came to the ER 12/01 pm-12/02. She was treated with [facility] and albuterol for K+ 5.2. 12/2 was her HD day, but she did not go due to being in the ER.   Today, she woke up with SOB. Then her legs started shaking again. Then she got chest pain. She felt very panicky because of the SOB. EMS gave her 324 ASA, SL NTG x 2. Her pain improved. At worst, it was 10/10, pressure. Worse with deep inspiration. Now completely gone.   In the ER, she got Dulera. She was able to calm herself down and is resting comfortably now. No SOB. Volume managed at HD, does not normally miss sessions, but some have ended early due to hypotension.   This is the first time she has had chest pain since pre-stents. No stress test or cath since moving here in 2019.     Past Medical History:  Diagnosis Date   Alcohol abuse    Arthralgia    CAD (coronary artery disease) 2006, 2016   stents when in [place]   Chronic  combined systolic and diastolic HF (heart failure) ([name])    COPD (chronic obstructive pulmonary disease) ([name])    Depression    Dyspnea    ESRD (end stage renal disease) ([place])    HTN (hypertension)    Hypercholesterolemia    primarily ldl-p and small particles   Lung cancer [facility])    Patient reports this with surgery.  No details.     RAS (renal artery stenosis) ([place]) 2002   by cath tysinger   Smoking     Past Surgical History:  Procedure Laterality Date   abdominal aortogram     perclose of the right femoral artery   AV FISTULA PLACEMENT Right 07/11/2018   Procedure: Right arm ARTERIOVENOUS FISTULA CREATION;  Surgeon: [name], MD;  Location: [place];  Service: Vascular;  Laterality: Right;   AVF placement Right    CARDIAC CATHETERIZATION     left heart catheterization.  Coronary cineangiography. Lft ventricular cineangiography.     LUNG SURGERY     due to lung cancer per patient's report   TUBAL LIGATION       Prior to Admission medications   Medication Sig Start Date End Date Taking? Authorizing Provider  albuterol (PROVENTIL HFA;VENTOLIN HFA) 108 (90 Base) MCG/ACT inhaler Inhale 2 puffs into the lungs every 4 (four) hours as needed for wheezing  or shortness of breath.  11/25/17  Yes [provider]  aspirin EC 81 MG tablet Take 81 mg by mouth daily.   Yes [provider]  atorvastatin (LIPITOR) 40 MG tablet Take 40 mg by mouth daily.   Yes [provider]  AURYXIA 1 GM 210 MG(Fe) tablet Take 420-630 mg by mouth See admin instructions. Take 3 tablets (630 mg) by mouth three times daily with meals and 2 tablets (420 mg) by mouth with each snack 04/06/19  Yes [provider]  b complex-vitamin c-folic acid (NEPHRO-VITE) 0.8 MG TABS tablet Take 1 tablet by mouth See admin instructions. Take 1 tablet by mouth in the morning on Sun/Tues/Thurs/Sat and 1 tablet after dialysis on Mon/Wed/Fri 02/06/19  Yes [provider]    budesonide-formoterol (SYMBICORT) 160-4.5 MCG/ACT inhaler Inhale 2 puffs into the lungs 2 (two) times daily.   Yes [provider]  buPROPion (WELLBUTRIN SR) 150 MG 12 hr tablet Take 150 mg by mouth 2 (two) times daily.   Yes [provider]  clopidogrel (PLAVIX) 75 MG tablet Take 75 mg by mouth daily.   Yes [provider]  ipratropium-albuterol (DUONEB) 0.5-2.5 (3) MG/3ML SOLN Take 3 mLs by nebulization every 6 (six) hours as needed. Patient taking differently: Take 3 mLs by nebulization every 6 (six) hours as needed (for wheezing or shortness of breath).  05/11/19  Yes Dana Allan I, MD  lidocaine-prilocaine (EMLA) cream Apply 1 application topically every Monday, Wednesday, and Friday with hemodialysis. Prior to dialysis 04/06/19  Yes [provider]  metoprolol succinate (TOPROL-XL) 50 MG 24 hr tablet Take 50 mg by mouth daily. Take with or immediately following a meal.   Yes [provider]  Nutritional Supplements (BOOST GLUCOSE CONTROL) LIQD Take 1 Can by mouth 2 (two) times daily.   Yes [provider]  OXYGEN Inhale 3 L/min into the lungs continuous.    Yes [provider]  QUEtiapine (SEROQUEL) 200 MG tablet Take 200 mg by mouth at bedtime.  09/20/19  Yes [provider]  Amino Acids-Protein Hydrolys (FEEDING SUPPLEMENT, PRO-STAT SUGAR FREE 64,) LIQD Take 30 mLs by mouth 2 (two) times daily between meals. Patient not taking: Reported on 10/19/2019 07/11/19   Kari Horns, MD  cinacalcet [facility]) 30 MG tablet Take 1 tablet (30 mg total) by mouth every Monday, Wednesday, and Friday at 6 PM. Patient not taking: Reported on 10/19/2019 05/12/19   Dana Allan I, MD  Fluticasone-Salmeterol (ADVAIR) 500-50 MCG/DOSE AEPB Inhale 1 puff into the lungs 2 (two) times daily.    [provider]  metoprolol succinate (TOPROL-XL) 25 MG 24 hr tablet Take 1 tablet (25 mg total) by mouth daily. Take with or immediately  following a meal. Patient not taking: Reported on 10/19/2019 07/06/19   Kari Horns, MD  tiotropium (SPIRIVA HANDIHALER) 18 MCG inhalation capsule Place 1 capsule (18 mcg total) into inhaler and inhale daily. Patient not taking: Reported on 10/19/2019 05/11/19 05/10/20  Bonnell Public, MD    Inpatient Medications: Scheduled Meds:  aspirin EC  81 mg Oral Daily   atorvastatin  40 mg Oral Daily   buPROPion  150 mg Oral BID   [START ON 10/20/2019] Chlorhexidine Gluconate Cloth  6 each Topical Q0600   clopidogrel  75 mg Oral Daily   ferric citrate  420 mg Oral With snacks   ferric citrate  630 mg Oral TID WC   heparin  5,000 Units Subcutaneous Q8H   [START ON 10/20/2019] lidocaine-prilocaine  1 application Topical Q M,W,F-HD   metoprolol succinate  50 mg Oral Daily   mometasone-formoterol  2 puff Inhalation BID   QUEtiapine  200 mg Oral QHS   Continuous Infusions:  PRN Meds: acetaminophen, ondansetron (ZOFRAN) IV  Allergies:    Allergies  Allergen Reactions   Citalopram Hydrobromide Hives   Morphine And Related Itching   Tape Other (See Comments)    PLASTIC, CLEAR TAPE STICKS TO THE SKIN AND POSSIBLY TEARS IT- PLEASE USE PAPER TAPE    Social History:   Social History   Socioeconomic History   Marital status: Divorced    Spouse name: CAR Procida   Number of children: 5   Years of education: 9   Highest education level: 9th grade  Occupational History   Occupation: Dance movement psychotherapist strain: Somewhat hard   Food insecurity    Worry: Sometimes true    Inability: Sometimes true   Transportation needs    Medical: No    Non-medical: No  Tobacco Use   Smoking status: Former Smoker    Years: 35.00    Types: Cigarettes    Quit date: 05/16/2019    Years since quitting: 0.4   Smokeless tobacco: Never Used   Tobacco comment: 3-4 cigarettes per day  Substance and Sexual Activity   Alcohol use: Not Currently    Frequency:  Never    Comment: states has quit drinking "a few months ago"   Drug use: Never   Sexual activity: Not Currently    Birth control/protection: None  Lifestyle   Physical activity    Days per week: 0 days    Minutes per session: 0 min   Stress: Very much  Relationships   Social connections    Talks on phone: More than three times a week    Gets together: Once a week    Attends religious service: 1 to 4 times per year    Active member of club or organization: No    Attends meetings of clubs or organizations: Never    Relationship status: Divorced   Intimate partner violence    Fear of current or ex partner: No    Emotionally abused: No    Physically abused: No    Forced sexual activity: No  Other Topics Concern   Not on file  Social History Narrative   ** Merged History Encounter **        Family History:   Family History  Problem Relation Age of Onset   Emphysema Father    Cancer Brother    Family Status:  Family Status  Relation Name Status   Father  Deceased   Mother  Deceased   Brother  Deceased    ROS:  Please see the history of present illness.  All other ROS reviewed and negative.     Physical Exam/Data:   Vitals:   10/19/19 1017 10/19/19 1018 10/19/19 1030 10/19/19 1200  BP: 132/86  140/82 (!) 158/97  Pulse: (!) 102 (!) 103 100 95  Resp: (!) 26 (!) 26 20 (!) 22  Temp:      TempSrc:      SpO2: 99% 98% 99% 100%   No intake or output data in the 24 hours ending 10/19/19 1247 There were no vitals filed for this visit. There is no height or weight on file to calculate BMI.  General:  Well nourished, well developed, female in no acute distress HEENT: normal Lymph: no adenopathy Neck: JVD -  9 cm Endocrine:  No thryomegaly Vascular: No carotid bruits; 4/4 extremity pulses 2+  Cardiac:  normal S1, S2; RRR; no murmur Lungs:   + wheezing, no rhonchi, + rales  Abd: soft, nontender, no hepatomegaly  Ext: no edema Musculoskeletal:  No  deformities, BUE and BLE strength normal and equal Skin: warm and dry  Neuro:  CNs 2-12 intact, no focal abnormalities noted; possible asterixis Psych:  Normal affect   EKG:  The EKG was personally reviewed and demonstrates:  12/03 ECG is ST, HR 106, diffuse inferolateral ST depression, no sig change from 07/2018 Telemetry:  Telemetry was personally reviewed and demonstrates:  SR, PVCs   CV studies:   ECHO: 06/17/2019  1. The left ventricle has mild-moderately reduced systolic function, with an ejection fraction of 40-45%. The cavity size was normal. There is mildly increased left ventricular wall thickness. Left ventricular diastolic Doppler parameters are consistent  with impaired relaxation. Elevated mean left atrial pressure.  2. The right ventricle has normal systolic function. The cavity was normal. There is no increase in right ventricular wall thickness.  3. Left atrial size was mildly dilated.  4. No evidence of mitral valve stenosis.  5. The aortic valve has an indeterminate number of cusps. Severely thickening of the aortic valve. Severe calcifcation of the aortic valve. No stenosis of the aortic valve.  6. The aorta is normal in size and structure.  7. The aortic root is normal in size and structure.  8. Pulmonary hypertension is not present, PASP is 27 mmHg.    Laboratory Data:   Chemistry Recent Labs  Lab 10/17/19 2240 10/19/19 0225  NA 140 138  K 5.2* 5.2*  CL 93* 94*  CO2 31 30  GLUCOSE 150* 139*  BUN 39* 58*  CREATININE 8.18* 10.48*  CALCIUM 10.0 8.9  GFRNONAA 5* 3*  GFRAA 5* 4*  ANIONGAP 16* 14    Lab Results  Component Value Date   ALT 14 10/18/2019   AST 30 10/18/2019   ALKPHOS 91 10/18/2019   BILITOT 0.7 10/18/2019   Hematology Recent Labs  Lab 10/17/19 2240 10/19/19 0225  WBC 9.1 12.0*  RBC 3.33* 2.92*  HGB 11.3* 10.0*  HCT 37.1 32.2*  MCV 111.4* 110.3*  MCH 33.9 34.2*  MCHC 30.5 31.1  RDW 16.1* 16.1*  PLT 207 156   Cardiac  Enzymes High Sensitivity Troponin:   Recent Labs  Lab 10/19/19 0225 10/19/19 0425  TROPONINIHS 80* 96*     TSH:  Lab Results  Component Value Date   TSH 0.749 04/01/2019   Lipids: Lab Results  Component Value Date   CHOL 125 04/01/2019   HDL 51 04/01/2019   LDLCALC 59 04/01/2019   TRIG 76 04/01/2019   CHOLHDL 2.5 04/01/2019   HgbA1c: Lab Results  Component Value Date   HGBA1C 4.9 04/01/2019   Magnesium:  Magnesium  Date Value Ref Range Status  10/18/2019 3.1 (H) 1.7 - 2.4 mg/dL Final    Comment:    SLIGHT HEMOLYSIS Performed at [facility], [place] [address], [place], [place] [address]    Lab Results  Component Value Date   CHOL 125 04/01/2019   HDL 51 04/01/2019   LDLCALC 59 04/01/2019   TRIG 76 04/01/2019   CHOLHDL 2.5 04/01/2019      Radiology/Studies:  Mr Brain Wo Contrast (neuro Protocol)  Result Date: 10/18/2019 CLINICAL DATA:  Focal neuro deficit, greater than 6 hours, stroke suspected. New aphasia since yesterday. Additional provided: Tremors and  difficulty speaking which generalized weakness. EXAM: MRI HEAD WITHOUT CONTRAST TECHNIQUE: Multiplanar, multiecho pulse sequences of the brain and surrounding structures were obtained without intravenous contrast. COMPARISON:  Report from brain MRI 04/01/2019 (images currently unavailable), brain MRI 02/25/2019, head CT 02/24/2019 FINDINGS: Brain: Punctate focus of subtle apparent restricted diffusion within the right frontal lobe white matter (seen only on axial diffusion-weighted imaging)(series 2, image 38). This may reflect image noise or a tiny early subacute infarct. No evidence of recent infarct elsewhere within the brain. No evidence of intracranial mass. No midline shift or extra-axial fluid collection. Again demonstrated is a large region of encephalomalacia and associated chronic blood products within the right frontal lobe and right basal ganglia, reflecting a chronic hemorrhagic infarct.  Redemonstrated chronic lacunar infarct within the left corona radiata. Background of moderate chronic small vessel ischemic disease. Cerebral volume is normal for age. Vascular: Lack of expected flow void within the intracranial left vertebral artery, unchanged from prior exams and likely reflecting chronic occlusion. Flow voids otherwise preserved within the proximal large arterial vessels. Skull and upper cervical spine: No focal marrow lesion Sinuses/Orbits: Visualized orbits demonstrate no acute abnormality. Minimal ethmoid sinus mucosal thickening. Trace fluid within right mastoid air cells. IMPRESSION: 1. Image noise artifact versus punctate early subacute infarct within the right frontal lobe white matter. 2. 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 3. Moderate chronic small vessel ischemic disease. 4. 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 Electronically Signed   By: Kellie Simmering DO   On: 10/18/2019 11:48   Dg Chest Portable 1 View  Result Date: 10/19/2019 CLINICAL DATA:  Shortness of breath EXAM: PORTABLE CHEST 1 VIEW COMPARISON:  08/07/2019 FINDINGS: There is hyperinflation of the lungs compatible with COPD. Cardiomegaly. Aortic atherosclerosis. No confluent airspace opacities, effusions or edema. No acute bony abnormality. IMPRESSION: Cardiomegaly, COPD.  No acute findings. Electronically Signed   By: Rolm Baptise M.D.   On: 10/19/2019 02:55    Assessment and Plan:   1. Chest pain, history of CAD -Enzyme elevation is not felt significant in the setting of end-stage renal disease and having missed dialysis -Possible demand ischemia in the setting of shortness of breath, COPD and CHF exacerbations -No history of chest pain during dialysis, probably her most strenuous activity -He is on good medical therapy with aspirin, Plavix, Toprol-XL 50 mg a day and Lipitor 40 mg a day -Follow during  dialysis, watch for ischemic symptoms  2.  Acute on chronic combined systolic and diastolic CHF -She has some volume overload by exam -Creatinine is higher than normal and she has hyperkalemia as well -Volume management with HD  3.  Hypertension: -Blood pressure somewhat elevated, systolic blood pressure [id number] this admission. -Continue current meds, reassess after dialysis  4.  COPD: -She has wheezing and rales on exam. -Mildly increased work of breathing and she still feels some shortness of breath -She has had her Dulera inhaler this a.m. -She has albuterol nebulizer at home, may be this while here -Per IM   Active Problems:   Essential hypertension   COPD (chronic obstructive pulmonary disease) ([name])   Type II diabetes mellitus with renal manifestations ([name])   Chest pain with moderate risk for cardiac etiology, hx CAD     For questions or updates, please contact [facility] Please consult [email or web address] for contact info under Cardiology/STEMI.   Jonetta Speak, PA-C  10/19/2019 12:47 PM

## 2019-10-19 NOTE — ED Notes (Signed)
Diet tray ordered for pt 

## 2019-10-19 NOTE — ED Notes (Signed)
Ordered renal diet tray for pt

## 2019-10-19 NOTE — ED Notes (Signed)
Breakfast tray at bedside 

## 2019-10-19 NOTE — ED Triage Notes (Signed)
Pt bib [facility] from after pt woke up suddenly SOB and sudden onset of chest pain. Pt has hx of COPD, asthma, STEMI, CHF. Pt wears 3.5 lpm o2 via [place] at baseline. Pt received 324mg  aspirin, nitro x 2 with resolution of chest pain. Pt is dialysis pt and missed her dialysis pt on Tues d/t pt being seen in ED on Tues for similar symptoms.  EMS VS BP 141/69 SPO2 99% on 3.5 lpm o2 via [place] TEMP 98.1 F oral RR 18 HR 105

## 2019-10-19 NOTE — Consult Note (Signed)
[facility] Renal Consultation Note    Indication for Consultation:  Management of ESRD/hemodialysis, anemia, hypertension/volume, and secondary hyperparathyroidism. PCP:  HPI: Kari Robinson is a 68 y.o. female with ESRD, COPD, Hx lung cancer (s/p surgical excision), and CAD (Hx stent) who is being admitted with CP + dyspnea.  She was in ED yesterday with ?tremors - work-up without acute findings, K 5.2 - given Lokelma with plan for HD today. Presented back to ED this morning with CP and dyspnea. CP was sharp and substernal. Dyspnea not improved with home inhalers. Intake labs show Na 138, K 5.2, WBC 12, Hgb 10. Trop 88 -> 96 on repeat. CXR with COPD changes only. EKG with ?mild lateral ST depression. Cardiology consulted - felt to be demand ischemia.   Seen in her ED room today. Tells me CP and dyspnea have resolved since using Dulera inhaler here. No fever, chills, N/V, or edema. COVID PCR pending.  Dialyzes on MWF sched at [facility] - last HD was Mon 11/30.  Past Medical History:  Diagnosis Date  . Alcohol abuse   . Arthralgia   . CAD (coronary artery disease) 2006, 2016   stents when in [place]  . Chronic combined systolic and diastolic HF (heart failure) ([place])   . COPD (chronic obstructive pulmonary disease) ([place])   . Depression   . Dyspnea   . ESRD (end stage renal disease) ([place])   . HTN (hypertension)   . Hypercholesterolemia    primarily ldl-p and small particles  . Lung cancer [facility])    Patient reports this with surgery.  No details.    [name] RAS (renal artery stenosis) ([place]) 2002   by cath tysinger  . Smoking    Past Surgical History:  Procedure Laterality Date  . abdominal aortogram     perclose of the right femoral artery  . AV FISTULA PLACEMENT Right 07/11/2018   Procedure: Right arm ARTERIOVENOUS FISTULA CREATION;  Surgeon: [name], MD;  Location: [place];  Service: Vascular;  Laterality: Right;  . AVF placement Right   . CARDIAC  CATHETERIZATION     left heart catheterization.  Coronary cineangiography. Lft ventricular cineangiography.    [name] LUNG SURGERY     due to lung cancer per patient's report  . TUBAL LIGATION     Family History  Problem Relation Age of Onset  . Emphysema Father   . Cancer Brother    Social History:  reports that she quit smoking about 5 months ago. Her smoking use included cigarettes. She quit after 35.00 years of use. She has never used smokeless tobacco. She reports previous alcohol use. She reports that she does not use drugs.  ROS: As per HPI otherwise negative.  Physical Exam: Vitals:   10/19/19 1018 10/19/19 1030 10/19/19 1200 10/19/19 1245  BP:  140/82 (!) 158/97 (!) 142/89  Pulse: (!) 103 100 95 93  Resp: (!) 26 20 (!) 22 (!) 21  Temp:      TempSrc:      SpO2: 98% 99% 100% 100%     General: Well developed, well nourished, in no acute distress. Head: Normocephalic, atraumatic, sclera non-icteric, mucus membranes are moist. Neck: Supple without lymphadenopathy/masses. JVD not elevated. Lungs: Scattered wheezing throughout all lung fields Heart: RRR with normal S1, S2. No murmurs, rubs, or gallops appreciated. Abdomen: Soft, non-tender, non-distended with normoactive bowel sounds.  Musculoskeletal:  Strength and tone appear normal for age. Lower extremities: No edema or ischemic changes, no open  wounds. Neuro: Alert and oriented X 3. Moves all extremities spontaneously. Psych:  Responds to questions appropriately with a normal affect. Dialysis Access: R forearm AVF + bruit  Allergies  Allergen Reactions  . Citalopram Hydrobromide Hives  . Morphine And Related Itching  . Tape Other (See Comments)    PLASTIC, CLEAR TAPE STICKS TO THE SKIN AND POSSIBLY TEARS IT- PLEASE USE PAPER TAPE   Prior to Admission medications   Medication Sig Start Date End Date Taking? Authorizing Provider  albuterol (PROVENTIL HFA;VENTOLIN HFA) 108 (90 Base) MCG/ACT inhaler Inhale 2 puffs into  the lungs every 4 (four) hours as needed for wheezing or shortness of breath.  11/25/17  Yes [provider]  aspirin EC 81 MG tablet Take 81 mg by mouth daily.   Yes [provider]  atorvastatin (LIPITOR) 40 MG tablet Take 40 mg by mouth daily.   Yes [provider]  AURYXIA 1 GM 210 MG(Fe) tablet Take 420-630 mg by mouth See admin instructions. Take 3 tablets (630 mg) by mouth three times daily with meals and 2 tablets (420 mg) by mouth with each snack 04/06/19  Yes [provider]  b complex-vitamin c-folic acid (NEPHRO-VITE) 0.8 MG TABS tablet Take 1 tablet by mouth See admin instructions. Take 1 tablet by mouth in the morning on Sun/Tues/Thurs/Sat and 1 tablet after dialysis on Mon/Wed/Fri 02/06/19  Yes [provider]  budesonide-formoterol (SYMBICORT) 160-4.5 MCG/ACT inhaler Inhale 2 puffs into the lungs 2 (two) times daily.   Yes [provider]  buPROPion (WELLBUTRIN SR) 150 MG 12 hr tablet Take 150 mg by mouth 2 (two) times daily.   Yes [provider]  clopidogrel (PLAVIX) 75 MG tablet Take 75 mg by mouth daily.   Yes [provider]  ipratropium-albuterol (DUONEB) 0.5-2.5 (3) MG/3ML SOLN Take 3 mLs by nebulization every 6 (six) hours as needed. Patient taking differently: Take 3 mLs by nebulization every 6 (six) hours as needed (for wheezing or shortness of breath).  05/11/19  Yes Dana Allan I, MD  lidocaine-prilocaine (EMLA) cream Apply 1 application topically every Monday, Wednesday, and Friday with hemodialysis. Prior to dialysis 04/06/19  Yes [provider]  metoprolol succinate (TOPROL-XL) 50 MG 24 hr tablet Take 50 mg by mouth daily. Take with or immediately following a meal.   Yes [provider]  Nutritional Supplements (BOOST GLUCOSE CONTROL) LIQD Take 1 Can by mouth 2 (two) times daily.   Yes [provider]  OXYGEN Inhale 3 L/min into the lungs continuous.    Yes [provider]  QUEtiapine (SEROQUEL) 200 MG tablet Take 200 mg by mouth at bedtime.  09/20/19  Yes [provider]  Amino Acids-Protein Hydrolys (FEEDING SUPPLEMENT, PRO-STAT SUGAR FREE 64,) LIQD Take 30 mLs by mouth 2 (two) times daily between meals. Patient not taking: Reported on 10/19/2019 07/11/19   Ladona Horns, MD  cinacalcet [facility]) 30 MG tablet Take 1 tablet (30 mg total) by mouth every Monday, Wednesday, and Friday at 6 PM. Patient not taking: Reported on 10/19/2019 05/12/19   Dana Allan I, MD  Fluticasone-Salmeterol (ADVAIR) 500-50 MCG/DOSE AEPB Inhale 1 puff into the lungs 2 (two) times daily.    [provider]  metoprolol succinate (TOPROL-XL) 25 MG 24 hr tablet Take 1 tablet (25 mg total) by mouth daily. Take with or immediately following a meal. Patient not taking: Reported on 10/19/2019 07/06/19   Ladona Horns, MD  tiotropium (SPIRIVA HANDIHALER) 18 MCG inhalation capsule Place 1  capsule (18 mcg total) into inhaler and inhale daily. Patient not taking: Reported on 10/19/2019 05/11/19 05/10/20  Bonnell Public, MD   Current Facility-Administered Medications  Medication Dose Route Frequency Provider Last Rate Last Dose  . acetaminophen (TYLENOL) tablet 650 mg  650 mg Oral Q4H PRN Fuller Plan A, MD      . aspirin EC tablet 81 mg  81 mg Oral Daily Smith, Rondell A, MD   81 mg at 10/19/19 1021  . atorvastatin (LIPITOR) tablet 40 mg  40 mg Oral Daily Tamala Julian, Rondell A, MD   40 mg at 10/19/19 1021  . buPROPion (WELLBUTRIN SR) 12 hr tablet 150 mg  150 mg Oral BID Fuller Plan A, MD   150 mg at 10/19/19 1021  . [START ON 10/20/2019] Chlorhexidine Gluconate Cloth 2 % PADS 6 each  6 each Topical Q0600 Loren Racer, PA-C      . clopidogrel (PLAVIX) tablet 75 mg  75 mg Oral Daily Smith, Rondell A, MD   75 mg at 10/19/19 1021  . ferric citrate (AURYXIA) tablet 420 mg  420 mg Oral With snacks Tamala Julian, Rondell A, MD   420 mg at 10/19/19 1022  . ferric citrate  (AURYXIA) tablet 630 mg  630 mg Oral TID WC Smith, Rondell A, MD      . heparin injection 5,000 Units  5,000 Units Subcutaneous Q8H Smith, Rondell A, MD      . Derrill Memo ON 10/20/2019] lidocaine-prilocaine (EMLA) cream 1 application  1 application Topical Q M,W,F-HD Smith, Rondell A, MD      . metoprolol succinate (TOPROL-XL) 24 hr tablet 50 mg  50 mg Oral Daily Smith, Rondell A, MD   50 mg at 10/19/19 1028  . mometasone-formoterol (DULERA) 200-5 MCG/ACT inhaler 2 puff  2 puff Inhalation BID Fuller Plan A, MD   2 puff at 10/19/19 1018  . ondansetron (ZOFRAN) injection 4 mg  4 mg Intravenous Q6H PRN Smith, Rondell A, MD      . QUEtiapine (SEROQUEL) tablet 200 mg  200 mg Oral QHS Norval Morton, MD       Current Outpatient Medications  Medication Sig Dispense Refill  . albuterol (PROVENTIL HFA;VENTOLIN HFA) 108 (90 Base) MCG/ACT inhaler Inhale 2 puffs into the lungs every 4 (four) hours as needed for wheezing or shortness of breath.     [name] aspirin EC 81 MG tablet Take 81 mg by mouth daily.    [name] atorvastatin (LIPITOR) 40 MG tablet Take 40 mg by mouth daily.    Lorin Picket 1 GM 210 MG(Fe) tablet Take 420-630 mg by mouth See admin instructions. Take 3 tablets (630 mg) by mouth three times daily with meals and 2 tablets (420 mg) by mouth with each snack    . b complex-vitamin c-folic acid (NEPHRO-VITE) 0.8 MG TABS tablet Take 1 tablet by mouth See admin instructions. Take 1 tablet by mouth in the morning on Sun/Tues/Thurs/Sat and 1 tablet after dialysis on Mon/Wed/Fri    . budesonide-formoterol (SYMBICORT) 160-4.5 MCG/ACT inhaler Inhale 2 puffs into the lungs 2 (two) times daily.    [name] buPROPion (WELLBUTRIN SR) 150 MG 12 hr tablet Take 150 mg by mouth 2 (two) times daily.    . clopidogrel (PLAVIX) 75 MG tablet Take 75 mg by mouth daily.    [name] ipratropium-albuterol (DUONEB) 0.5-2.5 (3) MG/3ML SOLN Take 3 mLs by nebulization every 6 (six) hours as needed. (Patient taking differently: Take 3 mLs by nebulization  every 6 (six) hours as  needed (for wheezing or shortness of breath). ) 360 mL 0  . lidocaine-prilocaine (EMLA) cream Apply 1 application topically every Monday, Wednesday, and Friday with hemodialysis. Prior to dialysis    . metoprolol succinate (TOPROL-XL) 50 MG 24 hr tablet Take 50 mg by mouth daily. Take with or immediately following a meal.    . Nutritional Supplements (BOOST GLUCOSE CONTROL) LIQD Take 1 Can by mouth 2 (two) times daily.    . OXYGEN Inhale 3 L/min into the lungs continuous.     [name] QUEtiapine (SEROQUEL) 200 MG tablet Take 200 mg by mouth at bedtime.     . Amino Acids-Protein Hydrolys (FEEDING SUPPLEMENT, PRO-STAT SUGAR FREE 64,) LIQD Take 30 mLs by mouth 2 (two) times daily between meals. (Patient not taking: Reported on 10/19/2019) 887 mL 0  . cinacalcet (SENSIPAR) 30 MG tablet Take 1 tablet (30 mg total) by mouth every Monday, Wednesday, and Friday at 6 PM. (Patient not taking: Reported on 10/19/2019) 60 tablet 0  . Fluticasone-Salmeterol (ADVAIR) 500-50 MCG/DOSE AEPB Inhale 1 puff into the lungs 2 (two) times daily.    . metoprolol succinate (TOPROL-XL) 25 MG 24 hr tablet Take 1 tablet (25 mg total) by mouth daily. Take with or immediately following a meal. (Patient not taking: Reported on 10/19/2019) 30 tablet 0  . tiotropium (SPIRIVA HANDIHALER) 18 MCG inhalation capsule Place 1 capsule (18 mcg total) into inhaler and inhale daily. (Patient not taking: Reported on 10/19/2019) 30 capsule 2   Labs: Basic Metabolic Panel: Recent Labs  Lab 10/17/19 2240 10/19/19 0225  NA 140 138  K 5.2* 5.2*  CL 93* 94*  CO2 31 30  GLUCOSE 150* 139*  BUN 39* 58*  CREATININE 8.18* 10.48*  CALCIUM 10.0 8.9   Liver Function Tests: Recent Labs  Lab 10/18/19 0850  AST 30  ALT 14  ALKPHOS 91  BILITOT 0.7  PROT 6.7  ALBUMIN 3.9   Recent Labs  Lab 10/18/19 0850  LIPASE 19   CBC: Recent Labs  Lab 10/17/19 2240 10/19/19 0225  WBC 9.1 12.0*  NEUTROABS  --  10.5*  HGB 11.3* 10.0*   HCT 37.1 32.2*  MCV 111.4* 110.3*  PLT 207 156   Studies/Results: Mr Brain Wo Contrast (neuro Protocol)  Result Date: 10/18/2019 CLINICAL DATA:  Focal neuro deficit, greater than 6 hours, stroke suspected. New aphasia since yesterday. Additional provided: Tremors and difficulty speaking which generalized weakness. EXAM: MRI HEAD WITHOUT CONTRAST TECHNIQUE: Multiplanar, multiecho pulse sequences of the brain and surrounding structures were obtained without intravenous contrast. COMPARISON:  Report from brain MRI 04/01/2019 (images currently unavailable), brain MRI 02/25/2019, head CT 02/24/2019 FINDINGS: Brain: Punctate focus of subtle apparent restricted diffusion within the right frontal lobe white matter (seen only on axial diffusion-weighted imaging)(series 2, image 38). This may reflect image noise or a tiny early subacute infarct. No evidence of recent infarct elsewhere within the brain. No evidence of intracranial mass. No midline shift or extra-axial fluid collection. Again demonstrated is a large region of encephalomalacia and associated chronic blood products within the right frontal lobe and right basal ganglia, reflecting a chronic hemorrhagic infarct. Redemonstrated chronic lacunar infarct within the left corona radiata. Background of moderate chronic small vessel ischemic disease. Cerebral volume is normal for age. Vascular: Lack of expected flow void within the intracranial left vertebral artery, unchanged from prior exams and likely reflecting chronic occlusion. Flow voids otherwise preserved within the proximal large arterial vessels. Skull and upper cervical spine: No focal marrow lesion Sinuses/Orbits:  Visualized orbits demonstrate no acute abnormality. Minimal ethmoid sinus mucosal thickening. Trace fluid within right mastoid air cells. IMPRESSION: 1. Image noise artifact versus punctate early subacute infarct within the right frontal lobe white matter. 2. 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 3. Moderate chronic small vessel ischemic disease. 4. 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 Electronically Signed   By: Kellie Simmering DO   On: 10/18/2019 11:48   Dg Chest Portable 1 View  Result Date: 10/19/2019 CLINICAL DATA:  Shortness of breath EXAM: PORTABLE CHEST 1 VIEW COMPARISON:  08/07/2019 FINDINGS: There is hyperinflation of the lungs compatible with COPD. Cardiomegaly. Aortic atherosclerosis. No confluent airspace opacities, effusions or edema. No acute bony abnormality. IMPRESSION: Cardiomegaly, COPD.  No acute findings. Electronically Signed   By: Rolm Baptise M.D.   On: 10/19/2019 02:55    Dialysis Orders:  MWF at [facility] 3:30hr, 350/800, EDW 72.5kg, 2K/2Ca, UFP 4, AVF, no heparin - Hectoral 64mcg IV q HD - Venofer 50mg  IV q week - MIrcera 165mcg IV q 2 weeks (last 11/30)  Assessment/Plan: 1.  Dyspnea/COPD exacerbation: Symptoms improved with Dulera inhaler - perhaps needs diff home COPD regimen. 2.  ^ Troponin: Hx CAD with PCI/stent in past. S/p cardiology consult - felt likely demand ischemia. Monitor. 3.  ESRD: MWF sched - missed her last HD. Doesn't seem to have excess volume on exam? Will do short-run today for 1L UF and correction of mild ^ K, then back to usual sched tomorrow. 4.  Hypertension/volume: BP high today - should improve with HD. 5.  Anemia: Hgb 10 - just given ESA as outpatient. 6.  Metabolic bone disease: Ca ok, follow.  Veneta Penton, PA-C 10/19/2019, 1:16 PM  [employer] Pager: [phone]

## 2019-10-19 NOTE — H&P (Signed)
History and Physical    Kari Robinson VOZ:[id number] DOB: Jun 22, 1951 DOA: 10/19/2019  Referring MD/NP/PA: Gean Birchwood, MD PCP: Tsosie Billing, MD (Inactive)  Patient coming from: Home via EMS  Chief Complaint: Chest pain and shortness of breath  I have personally briefly reviewed patient's old medical records in [place]   HPI: Kari Robinson is a 68 y.o. female with medical history significant of hypertension, hyperlipidemia, ESRD on HD(M/W/F), CAD s/p stents, COPD, oxygen dependent on 3 L of nasal cannula oxygen, tobacco abuse, and lung cancer s/p resection.  She presents with complaints chest pain and shortness of breath.  Symptoms woke her out of her sleep this morning.  Pain was described as pressure-like and localized to the lower substernal region.  Denies any radiation of pain, leg swelling, change in weight, or fever. Associated symptoms of nausea and had episode of vomiting once here at the emergency department.  She has had 3 previous stents placed and thinks her last heart cath was in [place] in 2014/2015.  Patient was seen in the emergency department yesterday for word finding difficulty and shakiness.  She underwent which revealed image noise artifact versus a punctate early subacute infarct of the right frontal lobe, but was ultimately discharged home.  Patient reports that she had missed hemodialysis because she was here at the hospital En route with EMS patient was given 4 bab y aspirin to chew and 2 nitroglycerin tablets.  After the nitroglycerin she noted resolution of her chest pain.   Patient also brought up the fact that for over the last 2 to 3 days she has been having intermittent leg jerking.   ED Course: Upon admission to the emergency department patient was noted to be afebrile, heart rates 86-1 42, respirations 13-34, blood pressure elevated up to 160/92, and O2 saturations 91 -100% on 3.5 L.  Chest x-ray revealed cardiomegaly and  hyperinflation.  Labs significant for WBC 12, hemoglobin 10, potassium 5.2, BUN 58, creatinine 10.48, high-sensitivity troponin 80-> 96.  EKG did not note some ST wave depressions concerning for possible ischemia.  [facility] cardiology was consulted.  She was given albuterol and ropinirole.  TRH called to admit.  Review of Systems  Constitutional: Positive for malaise/fatigue. Negative for fever.  HENT: Negative for congestion and nosebleeds.   Eyes: Negative for pain.  Respiratory: Positive for shortness of breath.   Cardiovascular: Positive for chest pain. Negative for claudication and leg swelling.  Gastrointestinal: Positive for nausea and vomiting.  Genitourinary: Negative for dysuria and hematuria.  Musculoskeletal: Negative for falls.  Neurological: Negative for focal weakness and loss of consciousness.  Psychiatric/Behavioral: Negative for memory loss and substance abuse.    Past Medical History:  Diagnosis Date  . Alcohol abuse   . Arthralgia   . CAD (coronary artery disease)   . Chronic combined systolic and diastolic HF (heart failure) ([place])   . COPD (chronic obstructive pulmonary disease) ([place])   . Depression   . Dyspnea   . ESRD (end stage renal disease) ([place])   . HTN (hypertension)   . Hypercholesterolemia    primarily ldl-p and small particles  . Lung cancer [facility])    Patient reports this with surgery.  No details.    [name] RAS (renal artery stenosis) ([place]) 2002   by cath tysinger  . Smoking     Past Surgical History:  Procedure Laterality Date  . abdominal aortogram     perclose of the right femoral artery  . AV  FISTULA PLACEMENT Right 07/11/2018   Procedure: Right arm ARTERIOVENOUS FISTULA CREATION;  Surgeon: [name], MD;  Location: [facility];  Service: Vascular;  Laterality: Right;  . AVF placement Right   . CARDIAC CATHETERIZATION     left heart catheterization.  Coronary cineangiography. Lft ventricular cineangiography.    [name] LUNG SURGERY     due to lung  cancer per patient's report  . TUBAL LIGATION       reports that she quit smoking about 5 months ago. Her smoking use included cigarettes. She quit after 35.00 years of use. She has never used smokeless tobacco. She reports previous alcohol use. She reports that she does not use drugs.  Allergies  Allergen Reactions  . Citalopram Hydrobromide Hives  . Morphine And Related Itching  . Tape Other (See Comments)    PLASTIC, CLEAR TAPE STICKS TO THE SKIN AND POSSIBLY TEARS IT- PLEASE USE PAPER TAPE    Family History  Problem Relation Age of Onset  . Emphysema Father   . Cancer Brother     Prior to Admission medications   Medication Sig Start Date End Date Taking? Authorizing Provider  albuterol (PROVENTIL HFA;VENTOLIN HFA) 108 (90 Base) MCG/ACT inhaler Inhale 2 puffs into the lungs every 4 (four) hours as needed for wheezing or shortness of breath.  11/25/17  Yes [provider]  aspirin EC 81 MG tablet Take 81 mg by mouth daily.   Yes [provider]  atorvastatin (LIPITOR) 40 MG tablet Take 40 mg by mouth daily.   Yes [provider]  AURYXIA 1 GM 210 MG(Fe) tablet Take 420-630 mg by mouth See admin instructions. Take 3 tablets (630 mg) by mouth three times daily with meals and 2 tablets (420 mg) by mouth with each snack 04/06/19  Yes [provider]  b complex-vitamin c-folic acid (NEPHRO-VITE) 0.8 MG TABS tablet Take 1 tablet by mouth See admin instructions. Take 1 tablet by mouth in the morning on Sun/Tues/Thurs/Sat and 1 tablet after dialysis on Mon/Wed/Fri 02/06/19  Yes [provider]  budesonide-formoterol (SYMBICORT) 160-4.5 MCG/ACT inhaler Inhale 2 puffs into the lungs 2 (two) times daily.   Yes [provider]  buPROPion (WELLBUTRIN SR) 150 MG 12 hr tablet Take 150 mg by mouth 2 (two) times daily.   Yes [provider]  clopidogrel (PLAVIX) 75 MG tablet Take 75 mg by mouth daily.   Yes [provider]   ipratropium-albuterol (DUONEB) 0.5-2.5 (3) MG/3ML SOLN Take 3 mLs by nebulization every 6 (six) hours as needed. Patient taking differently: Take 3 mLs by nebulization every 6 (six) hours as needed (for wheezing or shortness of breath).  05/11/19  Yes Dana Allan I, MD  lidocaine-prilocaine (EMLA) cream Apply 1 application topically every Monday, Wednesday, and Friday with hemodialysis. Prior to dialysis 04/06/19  Yes [provider]  metoprolol succinate (TOPROL-XL) 50 MG 24 hr tablet Take 50 mg by mouth daily. Take with or immediately following a meal.   Yes [provider]  Nutritional Supplements (BOOST GLUCOSE CONTROL) LIQD Take 1 Can by mouth 2 (two) times daily.   Yes [provider]  OXYGEN Inhale 3 L/min into the lungs continuous.    Yes [provider]  QUEtiapine (SEROQUEL) 200 MG tablet Take 200 mg by mouth at bedtime.  09/20/19  Yes [provider]  Amino Acids-Protein Hydrolys (FEEDING SUPPLEMENT, PRO-STAT SUGAR FREE 64,) LIQD Take 30 mLs by mouth 2 (two) times daily between meals. Patient not taking: Reported  on 10/19/2019 07/11/19   Ladona Horns, MD  cinacalcet [facility]) 30 MG tablet Take 1 tablet (30 mg total) by mouth every Monday, Wednesday, and Friday at 6 PM. Patient not taking: Reported on 10/19/2019 05/12/19   Dana Allan I, MD  Fluticasone-Salmeterol (ADVAIR) 500-50 MCG/DOSE AEPB Inhale 1 puff into the lungs 2 (two) times daily.    [provider]  metoprolol succinate (TOPROL-XL) 25 MG 24 hr tablet Take 1 tablet (25 mg total) by mouth daily. Take with or immediately following a meal. Patient not taking: Reported on 10/19/2019 07/06/19   Ladona Horns, MD  tiotropium (SPIRIVA HANDIHALER) 18 MCG inhalation capsule Place 1 capsule (18 mcg total) into inhaler and inhale daily. Patient not taking: Reported on 10/19/2019 05/11/19 05/10/20  Bonnell Public, MD    Physical Exam:  Constitutional: Elderly female who appears  to be in some respiratory distress. Vitals:   10/19/19 0630 10/19/19 0700 10/19/19 0730 10/19/19 0745  BP: (!) 152/94 (!) 140/121 (!) 138/100 (!) 152/79  Pulse: 93 92  93  Resp: 20 17  (!) 21  Temp:    98.7 F (37.1 C)  TempSrc:    Oral  SpO2: 100% 100%  100%   Eyes: PERRL, lids and conjunctivae normal ENMT: Mucous membranes are moist. Posterior pharynx clear of any exudate or lesions. Neck: normal, supple, no masses, no thyromegaly.  No JVD appreciated Respiratory: Positive crackles heard in the mid to lower lung fields. Cardiovascular: Regular rate and rhythm, no murmurs / rubs / gallops. No extremity edema. 2+ pedal pulses. No carotid bruits.  Abdomen: no tenderness, no masses palpated. No hepatosplenomegaly. Bowel sounds positive.  Musculoskeletal: no clubbing / cyanosis. No joint deformity upper and lower extremities. Good ROM, no contractures. Normal muscle tone.  Skin: no rashes, lesions, ulcers. No induration Neurologic: CN 2-12 grossly intact. Sensation intact, DTR normal. Strength 5/5 in all 4.  Psychiatric: Normal judgment and insight. Alert and oriented x 3. Normal mood.     Labs on Admission: I have personally reviewed following labs and imaging studies  CBC: Recent Labs  Lab 10/17/19 2240 10/19/19 0225  WBC 9.1 12.0*  NEUTROABS  --  10.5*  HGB 11.3* 10.0*  HCT 37.1 32.2*  MCV 111.4* 110.3*  PLT 207 737   Basic Metabolic Panel: Recent Labs  Lab 10/17/19 2240 10/18/19 0850 10/19/19 0225  NA 140  --  138  K 5.2*  --  5.2*  CL 93*  --  94*  CO2 31  --  30  GLUCOSE 150*  --  139*  BUN 39*  --  58*  CREATININE 8.18*  --  10.48*  CALCIUM 10.0  --  8.9  MG  --  3.1*  --    GFR: CrCl cannot be calculated (Unknown ideal weight.). Liver Function Tests: Recent Labs  Lab 10/18/19 0850  AST 30  ALT 14  ALKPHOS 91  BILITOT 0.7  PROT 6.7  ALBUMIN 3.9   Recent Labs  Lab 10/18/19 0850  LIPASE 19   No results for input(s): AMMONIA in the last 168  hours. Coagulation Profile: Recent Labs  Lab 10/18/19 0850  INR 1.1   Cardiac Enzymes: No results for input(s): CKTOTAL, CKMB, CKMBINDEX, TROPONINI in the last 168 hours. BNP (last 3 results) No results for input(s): PROBNP in the last 8760 hours. HbA1C: No results for input(s): HGBA1C in the last 72 hours. CBG: No results for input(s): GLUCAP in the last 168 hours. Lipid Profile: No results for input(s):  CHOL, HDL, LDLCALC, TRIG, CHOLHDL, LDLDIRECT in the last 72 hours. Thyroid Function Tests: No results for input(s): TSH, T4TOTAL, FREET4, T3FREE, THYROIDAB in the last 72 hours. Anemia Panel: No results for input(s): VITAMINB12, FOLATE, FERRITIN, TIBC, IRON, RETICCTPCT in the last 72 hours. Urine analysis:    Component Value Date/Time   COLORURINE STRAW (A) 02/25/2019 0735   APPEARANCEUR CLEAR 02/25/2019 0735   LABSPEC 1.004 (L) 02/25/2019 0735   PHURINE 9.0 (H) 02/25/2019 0735   GLUCOSEU NEGATIVE 02/25/2019 0735   HGBUR SMALL (A) 02/25/2019 0735   BILIRUBINUR NEGATIVE 02/25/2019 [place] 02/25/2019 0735   PROTEINUR 100 (A) 02/25/2019 0735   NITRITE NEGATIVE 02/25/2019 0735   LEUKOCYTESUR NEGATIVE 02/25/2019 0735   Sepsis Labs: No results found for this or any previous visit (from the past 240 hour(s)).   Radiological Exams on Admission: Mr Brain Wo Contrast (neuro Protocol)  Result Date: 10/18/2019 CLINICAL DATA:  Focal neuro deficit, greater than 6 hours, stroke suspected. New aphasia since yesterday. Additional provided: Tremors and difficulty speaking which generalized weakness. EXAM: MRI HEAD WITHOUT CONTRAST TECHNIQUE: Multiplanar, multiecho pulse sequences of the brain and surrounding structures were obtained without intravenous contrast. COMPARISON:  Report from brain MRI 04/01/2019 (images currently unavailable), brain MRI 02/25/2019, head CT 02/24/2019 FINDINGS: Brain: Punctate focus of subtle apparent restricted diffusion within the right frontal  lobe white matter (seen only on axial diffusion-weighted imaging)(series 2, image 38). This may reflect image noise or a tiny early subacute infarct. No evidence of recent infarct elsewhere within the brain. No evidence of intracranial mass. No midline shift or extra-axial fluid collection. Again demonstrated is a large region of encephalomalacia and associated chronic blood products within the right frontal lobe and right basal ganglia, reflecting a chronic hemorrhagic infarct. Redemonstrated chronic lacunar infarct within the left corona radiata. Background of moderate chronic small vessel ischemic disease. Cerebral volume is normal for age. Vascular: Lack of expected flow void within the intracranial left vertebral artery, unchanged from prior exams and likely reflecting chronic occlusion. Flow voids otherwise preserved within the proximal large arterial vessels. Skull and upper cervical spine: No focal marrow lesion Sinuses/Orbits: Visualized orbits demonstrate no acute abnormality. Minimal ethmoid sinus mucosal thickening. Trace fluid within right mastoid air cells. IMPRESSION: 1. Image noise artifact versus punctate early subacute infarct within the right frontal lobe white matter. 2. 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 3. Moderate chronic small vessel ischemic disease. 4. 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 Electronically Signed   By: Kellie Simmering DO   On: 10/18/2019 11:48   Dg Chest Portable 1 View  Result Date: 10/19/2019 CLINICAL DATA:  Shortness of breath EXAM: PORTABLE CHEST 1 VIEW COMPARISON:  08/07/2019 FINDINGS: There is hyperinflation of the lungs compatible with COPD. Cardiomegaly. Aortic atherosclerosis. No confluent airspace opacities, effusions or edema. No acute bony abnormality. IMPRESSION: Cardiomegaly, COPD.  No acute findings. Electronically Signed   By: Rolm Baptise M.D.    On: 10/19/2019 02:55    EKG: Independently reviewed.  106 bpm with signs of ST wave depression  Assessment/Plan Chest pain, elevated troponin: Acute.  Patient presents with complaints of chest pain and shortness of breath.  High-sensitivity troponin elevated at 80->96.  Patient reports chest pain resolved with nitroglycerin.  Audiology was formally consulted. -Admit to a cardiac telemetry bed  -Continue aspirin and Plavix -Cardiology consulted, will follow for further recommendations  Respiratory failure with hypoxia, COPD: Acute  on chronic.  At baseline patient on 3 L of oxygen at home.  O2 saturations currently maintained on home 2 L nasal cannula oxygen -Continuous pulse oximetry with nasal cannula oxygen as needed -Continue albuterol inhaler and pharmacy substitution for Symbicort  ESRD on HD, hyperkalemia: On admission patient presents with potassium 5.2.  She normally dialyzes Monday, Wednesday, Friday.  Reported missing her regularly scheduled hemodialysis yesterday as she was here at the hospital. -Continue renal diet with fluid restriction -Nephrology consulted for need of HD  Chronic systolic congestive heart failure: Last EF noted to be 40 to 45% by echo in 06/17/2019. -Strict I&Os -Daily weights  Leukocytosis: Acute.  WBC elevated at 12.  Suspect this is likely reactive in nature. -Recheck CBC in a.m.  Essential hypertension -Continue metoprolol  Depression -Continue Wellbutrin and Seroquel  Hyperlipidemia -Continue atorvastatin  Tobacco abuse: Patient reports approximately having a 60 smoking pack year history, but recently quit approximately 2 months ago.  -Encourage continued cessation of tobacco use  DVT prophylaxis: Heparin Code Status: Full Family Communication: no family present at bedside  Disposition Plan: Likely discharge home in 1 to 2 days depending on cardiac work-up Consults called: Cardiology Admission status: Observation  Norval Morton MD  [facility] Pager [phone]   If 7PM-7AM, please contact night-coverage [email or web address] Password [email or web address]  10/19/2019, 7:58 AM

## 2019-10-19 NOTE — Progress Notes (Signed)
Called HD to confirm dialysis for today. Per HD pt will be going to HD today around 1800. Pt refused standing weight upon arrival to room. Pt stated : "my  legs are weak and cant' stand up ". Bed weight taken. Pt states feeling short of breath, oxygen saturation at 100% , 3L Nasal canula.  MD paged to make aware.

## 2019-10-20 DIAGNOSIS — R4701 Aphasia: Secondary | ICD-10-CM | POA: Diagnosis present

## 2019-10-20 DIAGNOSIS — R778 Other specified abnormalities of plasma proteins: Secondary | ICD-10-CM | POA: Diagnosis not present

## 2019-10-20 DIAGNOSIS — I132 Hypertensive heart and chronic kidney disease with heart failure and with stage 5 chronic kidney disease, or end stage renal disease: Secondary | ICD-10-CM | POA: Diagnosis present

## 2019-10-20 DIAGNOSIS — N186 End stage renal disease: Secondary | ICD-10-CM | POA: Diagnosis present

## 2019-10-20 DIAGNOSIS — Z992 Dependence on renal dialysis: Secondary | ICD-10-CM | POA: Diagnosis not present

## 2019-10-20 DIAGNOSIS — D849 Immunodeficiency, unspecified: Secondary | ICD-10-CM | POA: Diagnosis present

## 2019-10-20 DIAGNOSIS — Q600 Renal agenesis, unilateral: Secondary | ICD-10-CM | POA: Diagnosis not present

## 2019-10-20 DIAGNOSIS — N2581 Secondary hyperparathyroidism of renal origin: Secondary | ICD-10-CM | POA: Diagnosis present

## 2019-10-20 DIAGNOSIS — E785 Hyperlipidemia, unspecified: Secondary | ICD-10-CM | POA: Diagnosis present

## 2019-10-20 DIAGNOSIS — E8889 Other specified metabolic disorders: Secondary | ICD-10-CM | POA: Diagnosis present

## 2019-10-20 DIAGNOSIS — R471 Dysarthria and anarthria: Secondary | ICD-10-CM | POA: Diagnosis present

## 2019-10-20 DIAGNOSIS — J9611 Chronic respiratory failure with hypoxia: Secondary | ICD-10-CM | POA: Diagnosis present

## 2019-10-20 DIAGNOSIS — E1122 Type 2 diabetes mellitus with diabetic chronic kidney disease: Secondary | ICD-10-CM | POA: Diagnosis present

## 2019-10-20 DIAGNOSIS — I252 Old myocardial infarction: Secondary | ICD-10-CM | POA: Diagnosis not present

## 2019-10-20 DIAGNOSIS — J441 Chronic obstructive pulmonary disease with (acute) exacerbation: Secondary | ICD-10-CM | POA: Diagnosis present

## 2019-10-20 DIAGNOSIS — I2510 Atherosclerotic heart disease of native coronary artery without angina pectoris: Secondary | ICD-10-CM | POA: Diagnosis present

## 2019-10-20 DIAGNOSIS — F1010 Alcohol abuse, uncomplicated: Secondary | ICD-10-CM | POA: Diagnosis present

## 2019-10-20 DIAGNOSIS — R531 Weakness: Secondary | ICD-10-CM

## 2019-10-20 DIAGNOSIS — I248 Other forms of acute ischemic heart disease: Secondary | ICD-10-CM | POA: Diagnosis present

## 2019-10-20 DIAGNOSIS — R079 Chest pain, unspecified: Secondary | ICD-10-CM | POA: Diagnosis not present

## 2019-10-20 DIAGNOSIS — F418 Other specified anxiety disorders: Secondary | ICD-10-CM | POA: Diagnosis present

## 2019-10-20 DIAGNOSIS — Z7902 Long term (current) use of antithrombotics/antiplatelets: Secondary | ICD-10-CM | POA: Diagnosis not present

## 2019-10-20 DIAGNOSIS — I5022 Chronic systolic (congestive) heart failure: Secondary | ICD-10-CM | POA: Diagnosis not present

## 2019-10-20 DIAGNOSIS — I5043 Acute on chronic combined systolic (congestive) and diastolic (congestive) heart failure: Secondary | ICD-10-CM | POA: Diagnosis present

## 2019-10-20 DIAGNOSIS — Z20828 Contact with and (suspected) exposure to other viral communicable diseases: Secondary | ICD-10-CM | POA: Diagnosis present

## 2019-10-20 DIAGNOSIS — I255 Ischemic cardiomyopathy: Secondary | ICD-10-CM | POA: Diagnosis present

## 2019-10-20 DIAGNOSIS — E1151 Type 2 diabetes mellitus with diabetic peripheral angiopathy without gangrene: Secondary | ICD-10-CM | POA: Diagnosis present

## 2019-10-20 DIAGNOSIS — E875 Hyperkalemia: Secondary | ICD-10-CM | POA: Diagnosis present

## 2019-10-20 DIAGNOSIS — D631 Anemia in chronic kidney disease: Secondary | ICD-10-CM | POA: Diagnosis present

## 2019-10-20 LAB — RENAL FUNCTION PANEL
Albumin: 3.1 g/dL — ABNORMAL LOW (ref 3.5–5.0)
Anion gap: 15 (ref 5–15)
BUN: 36 mg/dL — ABNORMAL HIGH (ref 8–23)
CO2: 26 mmol/L (ref 22–32)
Calcium: 8.9 mg/dL (ref 8.9–10.3)
Chloride: 96 mmol/L — ABNORMAL LOW (ref 98–111)
Creatinine, Ser: 7.59 mg/dL — ABNORMAL HIGH (ref 0.44–1.00)
GFR calc Af Amer: 6 mL/min — ABNORMAL LOW (ref >60)
GFR calc non Af Amer: 5 mL/min — ABNORMAL LOW (ref >60)
Glucose, Bld: 87 mg/dL (ref 70–99)
Phosphorus: 3.8 mg/dL (ref 2.5–4.6)
Potassium: 4.1 mmol/L (ref 3.5–5.1)
Sodium: 137 mmol/L (ref 135–145)

## 2019-10-20 LAB — CBC WITH DIFFERENTIAL/PLATELET
Abs Immature Granulocytes: 0.03 10*3/uL (ref 0.00–0.07)
Basophils Absolute: 0 10*3/uL (ref 0.0–0.1)
Basophils Relative: 1 %
Eosinophils Absolute: 0.2 10*3/uL (ref 0.0–0.5)
Eosinophils Relative: 3 %
HCT: 33.1 % — ABNORMAL LOW (ref 36.0–46.0)
Hemoglobin: 10.3 g/dL — ABNORMAL LOW (ref 12.0–15.0)
Immature Granulocytes: 1 %
Lymphocytes Relative: 10 %
Lymphs Abs: 0.6 10*3/uL — ABNORMAL LOW (ref 0.7–4.0)
MCH: 34.4 pg — ABNORMAL HIGH (ref 26.0–34.0)
MCHC: 31.1 g/dL (ref 30.0–36.0)
MCV: 110.7 fL — ABNORMAL HIGH (ref 80.0–100.0)
Monocytes Absolute: 0.7 10*3/uL (ref 0.1–1.0)
Monocytes Relative: 11 %
Neutro Abs: 4.7 10*3/uL (ref 1.7–7.7)
Neutrophils Relative %: 74 %
Platelets: 189 10*3/uL (ref 150–400)
RBC: 2.99 MIL/uL — ABNORMAL LOW (ref 3.87–5.11)
RDW: 16.7 % — ABNORMAL HIGH (ref 11.5–15.5)
WBC: 6.3 10*3/uL (ref 4.0–10.5)
nRBC: 0.6 % — ABNORMAL HIGH (ref 0.0–0.2)

## 2019-10-20 LAB — AMMONIA: Ammonia: 13 umol/L (ref 9–35)

## 2019-10-20 NOTE — Progress Notes (Signed)
Received call from cardiology. Continue to monitor and notify if patient has a run of 30 beats vtach or longer OR becomes symptomatic. Will continue to monitor.

## 2019-10-20 NOTE — Progress Notes (Signed)
Progress Note    Kari Robinson  NLZ:[id number] DOB: [birth year]  DOA: 10/19/2019 PCP: Tsosie Billing, MD (Inactive)    Brief Narrative:     Medical records reviewed and are as summarized below:  Kari Robinson is an 68 y.o. female with medical history significant of hypertension, hyperlipidemia, ESRD on HD(M/W/F), CAD s/p stents, COPD, oxygen dependent on 3 L of nasal cannula oxygen, tobacco abuse, and lung cancer s/p resection.  She presents with complaints chest pain and shortness of breath.  Symptoms woke her out of her sleep this morning.  Pain was described as pressure-like and localized to the lower substernal region.  Denies any radiation of pain, leg swelling, change in weight, or fever. Associated symptoms of nausea and had episode of vomiting once here at the emergency department.  She has had 3 previous stents placed and thinks her last heart cath was in [place] in 2014/2015.  Patient was seen in the emergency department yesterday for word finding difficulty and shakiness.   Assessment/Plan:   Active Problems:   Essential hypertension   DM (diabetes mellitus), type 2 with renal complications ([name])   Chronic systolic CHF (congestive heart failure) ([name])   COPD (chronic obstructive pulmonary disease) ([name])   Type II diabetes mellitus with renal manifestations ([name])   HLD (hyperlipidemia)   ESRD (end stage renal disease) on dialysis ([name])   Chest pain with moderate risk for cardiac etiology, hx CAD   Elevated troponin   Hyperkalemia   Chest pain, elevated troponin: Acute.  Patient presents with complaints of chest pain and shortness of breath.  High-sensitivity troponin elevated at 80->96.  Patient reports chest pain resolved with nitroglycerin.  Cardiology was formally consulted. -Continue aspirin and Plavix -Cardiology consulted,-plan for medical management  Chronic Respiratory failure with hypoxia, COPD: -At baseline patient on 3 L of oxygen at home  -Continue albuterol inhaler and pharmacy substitution for Symbicort  ESRD on HD, hyperkalemia: On admission patient presents with potassium 5.2.  She normally dialyzes Monday, Wednesday, Friday.  Reported missing her regularly scheduled hemodialysis yesterday as she was here at the hospital. -Continue renal diet with fluid restriction -Nephrology consulted for HD  Chronic systolic congestive heart failure: Last EF noted to be 40 to 45% by echo in 06/17/2019. -Strict I&Os -Daily weights -volume managed by HD  Leukocytosis:  -was reactive-- resolved  Essential hypertension -Continue metoprolol  Depression -Continue Wellbutrin and Seroquel  Hyperlipidemia -Continue atorvastatin  Tobacco abuse: Patient reports approximately having a 60 smoking pack year history, but recently quit approximately 2 months ago.  -Encourage continued cessation of tobacco use  Deconditioning -PT has seen and recommends SNF -? Asterixis- check ammonia level (h/o alcohol abuse)   Family Communication/Anticipated D/C date and plan/Code Status   DVT prophylaxis: Lovenox ordered. Code Status: Full Code.  Family Communication:  Disposition Plan: seen by PT- recommend SNF   Medical Consultants:    Cards  renal  Subjective:   Says she has a hard time remembering things  Objective:    Vitals:   10/20/19 0513 10/20/19 0513 10/20/19 0811 10/20/19 0823  BP:  134/85 106/83   Pulse:  91 70   Resp:  (!) 24 17   Temp:  97.6 F (36.4 C) 97.7 F (36.5 C)   TempSrc:  Oral Oral   SpO2:  93% 96% 97%  Weight: 75.6 kg     Height:        Intake/Output Summary (Last 24 hours) at 10/20/2019 1307 Last  data filed at 10/20/2019 0847 Gross per 24 hour  Intake 480 ml  Output 1100 ml  Net -620 ml   Filed Weights   10/19/19 2059 10/19/19 2309 10/20/19 0513  Weight: 75 kg 74 kg 75.6 kg    Exam: In bed, chronically ill appearing rrr No increased work of breathing On O2 chronically  Data  Reviewed:   I have personally reviewed following labs and imaging studies:  Labs: Labs show the following:   Basic Metabolic Panel: Recent Labs  Lab 10/17/19 2240 10/18/19 0850 10/19/19 0225 10/20/19 0537  NA 140  --  138 137  K 5.2*  --  5.2* 4.1  CL 93*  --  94* 96*  CO2 31  --  30 26  GLUCOSE 150*  --  139* 87  BUN 39*  --  58* 36*  CREATININE 8.18*  --  10.48* 7.59*  CALCIUM 10.0  --  8.9 8.9  MG  --  3.1*  --   --   PHOS  --   --   --  3.8   GFR Estimated Creatinine Clearance: 7 mL/min (A) (by C-G formula based on SCr of 7.59 mg/dL (H)). Liver Function Tests: Recent Labs  Lab 10/18/19 0850 10/20/19 0537  AST 30  --   ALT 14  --   ALKPHOS 91  --   BILITOT 0.7  --   PROT 6.7  --   ALBUMIN 3.9 3.1*   Recent Labs  Lab 10/18/19 0850  LIPASE 19   No results for input(s): AMMONIA in the last 168 hours. Coagulation profile Recent Labs  Lab 10/18/19 0850  INR 1.1    CBC: Recent Labs  Lab 10/17/19 2240 10/19/19 0225 10/20/19 0734  WBC 9.1 12.0* 6.3  NEUTROABS  --  10.5* 4.7  HGB 11.3* 10.0* 10.3*  HCT 37.1 32.2* 33.1*  MCV 111.4* 110.3* 110.7*  PLT 207 156 189   Cardiac Enzymes: No results for input(s): CKTOTAL, CKMB, CKMBINDEX, TROPONINI in the last 168 hours. BNP (last 3 results) No results for input(s): PROBNP in the last 8760 hours. CBG: Recent Labs  Lab 10/19/19 1655  GLUCAP 105*   D-Dimer: No results for input(s): DDIMER in the last 72 hours. Hgb A1c: No results for input(s): HGBA1C in the last 72 hours. Lipid Profile: No results for input(s): CHOL, HDL, LDLCALC, TRIG, CHOLHDL, LDLDIRECT in the last 72 hours. Thyroid function studies: No results for input(s): TSH, T4TOTAL, T3FREE, THYROIDAB in the last 72 hours.  Invalid input(s): FREET3 Anemia work up: No results for input(s): VITAMINB12, FOLATE, FERRITIN, TIBC, IRON, RETICCTPCT in the last 72 hours. Sepsis Labs: Recent Labs  Lab 10/17/19 2240 10/19/19 0225 10/20/19 0734   WBC 9.1 12.0* 6.3    Microbiology Recent Results (from the past 240 hour(s))  SARS CORONAVIRUS 2 (TAT 6-24 HRS) Nasopharyngeal Nasopharyngeal Swab     Status: None   Collection Time: 10/19/19  7:39 AM   Specimen: Nasopharyngeal Swab  Result Value Ref Range Status   SARS Coronavirus 2 NEGATIVE NEGATIVE Final    Comment: (NOTE) SARS-CoV-2 target nucleic acids are NOT DETECTED. The SARS-CoV-2 RNA is generally detectable in upper and lower respiratory specimens during the acute phase of infection. Negative results do not preclude SARS-CoV-2 infection, do not rule out co-infections with other pathogens, and should not be used as the sole basis for treatment or other patient management decisions. Negative results must be combined with clinical observations, patient history, and epidemiological information. The expected result is  Negative. Fact Sheet for Patients: SugarRoll.be Fact Sheet for Healthcare Providers: https://www.woods-mathews.com/ This test is not yet approved or cleared by the [place] FDA and  has been authorized for detection and/or diagnosis of SARS-CoV-2 by FDA under an Emergency Use Authorization (EUA). This EUA will remain  in effect (meaning this test can be used) for the duration of the COVID-19 declaration under Section 56 4(b)(1) of the Act, 21 U.S.C. section 360bbb-3(b)(1), unless the authorization is terminated or revoked sooner. Performed at [facility], [place] [address], [place], [place] [address]     Procedures and diagnostic studies:  Dg Chest Portable 1 View  Result Date: 10/19/2019 CLINICAL DATA:  Shortness of breath EXAM: PORTABLE CHEST 1 VIEW COMPARISON:  08/07/2019 FINDINGS: There is hyperinflation of the lungs compatible with COPD. Cardiomegaly. Aortic atherosclerosis. No confluent airspace opacities, effusions or edema. No acute bony abnormality. IMPRESSION: Cardiomegaly, COPD.  No acute  findings. Electronically Signed   By: Rolm Baptise M.D.   On: 10/19/2019 02:55    Medications:   . aspirin EC  81 mg Oral Daily  . atorvastatin  40 mg Oral Daily  . buPROPion  150 mg Oral BID  . Chlorhexidine Gluconate Cloth  6 each Topical Q0600  . clopidogrel  75 mg Oral Daily  . ferric citrate  420 mg Oral With snacks  . ferric citrate  630 mg Oral TID WC  . heparin  5,000 Units Subcutaneous Q8H  . lidocaine-prilocaine  1 application Topical Q M,W,F-HD  . metoprolol succinate  50 mg Oral Daily  . mometasone-formoterol  2 puff Inhalation BID  . QUEtiapine  200 mg Oral QHS   Continuous Infusions:   LOS: 0 days   Geradine Girt  [facility]   How to contact the [facility] Attending or Consulting provider [place] or covering provider during after hours [place], for this patient?  1. Check the care team in [facility] and look for a) attending/consulting TR[facility] provider listed and b) the [facility] team listed 2. Log into [email or web address] and use [place]'s universal password to access. If you do not have the password, please contact the hospital operator. 3. Locate the [facility] provider you are looking for under [facility] and page to a number that you can be directly reached. 4. If you still have difficulty reaching the provider, please page the [facility] (Director on Call) for the Hospitalists listed on amion for assistance.  10/20/2019, 1:07 PM

## 2019-10-20 NOTE — Progress Notes (Signed)
Progress Note  Patient Name: Kari Robinson Date of Encounter: 10/20/2019  Primary Cardiologist:  Minus Breeding, MD  Subjective   Breathing better, no more CP  Inpatient Medications    Scheduled Meds: . aspirin EC  81 mg Oral Daily  . atorvastatin  40 mg Oral Daily  . buPROPion  150 mg Oral BID  . Chlorhexidine Gluconate Cloth  6 each Topical Q0600  . clopidogrel  75 mg Oral Daily  . ferric citrate  420 mg Oral With snacks  . ferric citrate  630 mg Oral TID WC  . heparin  5,000 Units Subcutaneous Q8H  . lidocaine-prilocaine  1 application Topical Q M,W,F-HD  . metoprolol succinate  50 mg Oral Daily  . mometasone-formoterol  2 puff Inhalation BID  . QUEtiapine  200 mg Oral QHS   Continuous Infusions:  PRN Meds: acetaminophen, ondansetron (ZOFRAN) IV   Vital Signs    Vitals:   10/20/19 0012 10/20/19 0513 10/20/19 0513 10/20/19 0811  BP: (!) 156/93  134/85 106/83  Pulse: (!) 107  91 70  Resp: 20  (!) 24 17  Temp: 99.2 F (37.3 C)  97.6 F (36.4 C) 97.7 F (36.5 C)  TempSrc: Oral  Oral Oral  SpO2: 96%  93% 96%  Weight:  75.6 kg    Height:        Intake/Output Summary (Last 24 hours) at 10/20/2019 0928 Last data filed at 10/20/2019 0847 Gross per 24 hour  Intake 480 ml  Output 1100 ml  Net -620 ml   Filed Weights   10/19/19 2059 10/19/19 2309 10/20/19 0513  Weight: 75 kg 74 kg 75.6 kg   Last Weight  Most recent update: 10/20/2019  5:14 AM   Weight  75.6 kg (166 lb 10.7 oz)           Weight change:    Telemetry    SR, PVCs, 5 bt run VT - Personally Reviewed  ECG    None today - Personally Reviewed  Physical Exam   General: Well developed, well nourished, female appearing in no acute distress. Head: Normocephalic, atraumatic.  Neck: Supple without bruits, JVD not sig elevated. Lungs:  Resp regular and unlabored, rales bases w/ slight exp wheeze Heart: RRR, S1, S2, no S3, S4, or murmur; no rub. Abdomen: Soft, non-tender, non-distended with  normoactive bowel sounds. No hepatomegaly. No rebound/guarding. No obvious abdominal masses. Extremities: No clubbing, cyanosis, no edema. Distal pedal pulses are 2+ bilaterally. Neuro: Alert and oriented X 3. Moves all extremities spontaneously. Psych: Normal affect.  Labs    Hematology Recent Labs  Lab 10/17/19 2240 10/19/19 0225 10/20/19 0734  WBC 9.1 12.0* 6.3  RBC 3.33* 2.92* 2.99*  HGB 11.3* 10.0* 10.3*  HCT 37.1 32.2* 33.1*  MCV 111.4* 110.3* 110.7*  MCH 33.9 34.2* 34.4*  MCHC 30.5 31.1 31.1  RDW 16.1* 16.1* 16.7*  PLT 207 156 189    Chemistry Recent Labs  Lab 10/17/19 2240 10/18/19 0850 10/19/19 0225 10/20/19 0537  NA 140  --  138 137  K 5.2*  --  5.2* 4.1  CL 93*  --  94* 96*  CO2 31  --  30 26  GLUCOSE 150*  --  139* 87  BUN 39*  --  58* 36*  CREATININE 8.18*  --  10.48* 7.59*  CALCIUM 10.0  --  8.9 8.9  PROT  --  6.7  --   --   ALBUMIN  --  3.9  --  3.1*  AST  --  30  --   --   ALT  --  14  --   --   ALKPHOS  --  91  --   --   BILITOT  --  0.7  --   --   GFRNONAA 5*  --  3* 5*  GFRAA 5*  --  4* 6*  ANIONGAP 16*  --  14 15     High Sensitivity Troponin:   Recent Labs  Lab 10/19/19 0225 10/19/19 0425  TROPONINIHS 35* 94*      Radiology    Mr Brain Wo Contrast (neuro Protocol)  Result Date: 10/18/2019 CLINICAL DATA:  Focal neuro deficit, greater than 6 hours, stroke suspected. New aphasia since yesterday. Additional provided: Tremors and difficulty speaking which generalized weakness. EXAM: MRI HEAD WITHOUT CONTRAST TECHNIQUE: Multiplanar, multiecho pulse sequences of the brain and surrounding structures were obtained without intravenous contrast. COMPARISON:  Report from brain MRI 04/01/2019 (images currently unavailable), brain MRI 02/25/2019, head CT 02/24/2019 FINDINGS: Brain: Punctate focus of subtle apparent restricted diffusion within the right frontal lobe white matter (seen only on axial diffusion-weighted imaging)(series 2, image 38).  This may reflect image noise or a tiny early subacute infarct. No evidence of recent infarct elsewhere within the brain. No evidence of intracranial mass. No midline shift or extra-axial fluid collection. Again demonstrated is a large region of encephalomalacia and associated chronic blood products within the right frontal lobe and right basal ganglia, reflecting a chronic hemorrhagic infarct. Redemonstrated chronic lacunar infarct within the left corona radiata. Background of moderate chronic small vessel ischemic disease. Cerebral volume is normal for age. Vascular: Lack of expected flow void within the intracranial left vertebral artery, unchanged from prior exams and likely reflecting chronic occlusion. Flow voids otherwise preserved within the proximal large arterial vessels. Skull and upper cervical spine: No focal marrow lesion Sinuses/Orbits: Visualized orbits demonstrate no acute abnormality. Minimal ethmoid sinus mucosal thickening. Trace fluid within right mastoid air cells. IMPRESSION: 1. Image noise artifact versus punctate early subacute infarct within the right frontal lobe white matter. 2. 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 3. Moderate chronic small vessel ischemic disease. 4. 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 Electronically Signed   By: Kellie Simmering DO   On: 10/18/2019 11:48   Dg Chest Portable 1 View  Result Date: 10/19/2019 CLINICAL DATA:  Shortness of breath EXAM: PORTABLE CHEST 1 VIEW COMPARISON:  08/07/2019 FINDINGS: There is hyperinflation of the lungs compatible with COPD. Cardiomegaly. Aortic atherosclerosis. No confluent airspace opacities, effusions or edema. No acute bony abnormality. IMPRESSION: Cardiomegaly, COPD.  No acute findings. Electronically Signed   By: Rolm Baptise M.D.   On: 10/19/2019 02:55     Cardiac Studies   None this admit  Patient Profile      68 y.o. female w/ hx  CAD w/ stents in [place] 2005 & 2016, ICM, COPD, lung CA s/p L lung resection 2017, ERSD on HD, PAF, tob use (quit 07/2019), NSTEMI 09/2017 w/ med rx (most meds not filled), S-CHF w/ EF 25% 11/2016 improved to 45-50% 06/2019 was admitted 12/03 w/ CP, SOB  Assessment & Plan    1. CP - no recurrence once volume improved w/ HD - hx CAD but no recent cath - continue med rx, f/u as outpt - if continues to have HD w/out CP (the most strenuous thing she does), med therapy is appropriate. - continue ASA,  statin, BB - not on Imdur and pt sometimes has problems w/ hypotension at HD - f/u as outpt  Otherwise, per IM Active Problems:   Essential hypertension   DM (diabetes mellitus), type 2 with renal complications ([name])   Chronic systolic CHF (congestive heart failure) ([name])   COPD (chronic obstructive pulmonary disease) ([place])   Type II diabetes mellitus with renal manifestations ([name])   HLD (hyperlipidemia)   ESRD (end stage renal disease) on dialysis ([name])   Chest pain with moderate risk for cardiac etiology, hx CAD   Elevated troponin   Hyperkalemia    Jonetta Speak , PA-C 9:28 AM 10/20/2019 Pager: [phone]

## 2019-10-20 NOTE — Evaluation (Signed)
Physical Therapy Evaluation Patient Details Name: Kari Robinson MRN: [id number] DOB: 03/25/1951 Today's Date: 10/20/2019   History of Present Illness  Kari Robinson is a 68 y.o. female with medical history significant of hypertension, hyperlipidemia, ESRD on HD(M/W/F), CAD s/p stents, COPD, oxygen dependent on 3 L of nasal cannula oxygen, tobacco abuse, and lung cancer s/p resection.  She presents with complaints chest pain and shortness of breath.  Also reporting over the last 2 to 3 days she has been having intermittent leg jerking.  Clinical Impression  Patient presents with decreased mobility due to decreased strength and motor control of bilateral LE.  Seems similar to tremors with asterixis.  Feel she is right now not safe for home so recommending SNF.  If progresses with correcting of kidney function will update d/c recommendations.    Follow Up Recommendations SNF    Equipment Recommendations  None recommended by PT    Recommendations for Other Services       Precautions / Restrictions Precautions Precautions: Fall Precaution Comments: reports fell PTA, unable to rise with assist from significant other      Mobility  Bed Mobility Overal bed mobility: Needs Assistance Bed Mobility: Supine to Sit;Sit to Supine     Supine to sit: HOB elevated;Min assist;Mod assist Sit to supine: Mod assist   General bed mobility comments: cues for rolling with rail, assisted legs off bed and pt pushed on rail to sit; to supine assist for legs  Transfers Overall transfer level: Needs assistance Equipment used: Rolling walker (2 wheeled) Transfers: Sit to/from Stand Sit to Stand: From elevated surface;Mod assist         General transfer comment: initially attempted to stand from regular height, but pt's legs buckled and she sat on bed, with elevating height of bed mod A to stand for balance and pt pulled up on RW stabilized by PT; sat with uncontrolled descent after side stepping  up toward [facility]  Ambulation/Gait Ambulation/Gait assistance: Min assist;Mod assist Gait Distance (Feet): 1 Feet Assistive device: Rolling walker (2 wheeled) Gait Pattern/deviations: Step-to pattern;Shuffle     General Gait Details: very tiny steps towards HOB to R with RW and min to mod A for balance  Stairs            Wheelchair Mobility    Modified Rankin (Stroke Patients Only)       Balance Overall balance assessment: Needs assistance Sitting-balance support: Feet supported Sitting balance-Leahy Scale: Fair     Standing balance support: Bilateral upper extremity supported Standing balance-Leahy Scale: Poor Standing balance comment: mod A and UE support for balance                             Pertinent Vitals/Pain Pain Assessment: No/denies pain    Home Living Family/patient expects to be discharged to:: Skilled nursing facility Living Arrangements: Spouse/significant other Available Help at Discharge: Friend(s);Available 24 hours/day Type of Home: Apartment Home Access: Level entry     Home Layout: One level Home Equipment: Clinical cytogeneticist - 4 wheels;Grab bars - tub/shower;Toilet riser Additional Comments: boyfriend stay with her    Prior Function Level of Independence: Needs assistance   Gait / Transfers Assistance Needed: uses rollator or cane, takes transportation to dialysis  ADL's / Homemaking Assistance Needed: pt is independent in self care with shower seat and grabbars, boyfriend does housekeeping and meal prep        Hand Dominance  Extremity/Trunk Assessment   Upper Extremity Assessment Upper Extremity Assessment: Generalized weakness(no jerking, but reports wrists bilaterally give away)    Lower Extremity Assessment Lower Extremity Assessment: LLE deficits/detail;RLE deficits/detail RLE Deficits / Details: AAROM WFL, strength unable to test due to asterixis like jerking in LE's RLE Coordination: decreased gross  motor LLE Deficits / Details: AAROM WFL, strength unable to test due to asterixis like jerking in LE's LLE Coordination: decreased gross motor       Communication   Communication: No difficulties  Cognition Arousal/Alertness: Awake/alert Behavior During Therapy: WFL for tasks assessed/performed Overall Cognitive Status: Within Functional Limits for tasks assessed                                        General Comments General comments (skin integrity, edema, etc.): patient fearful due to lack of control of her legs and tearful at times    Exercises     Assessment/Plan    PT Assessment Patient needs continued PT services  PT Problem List Decreased strength;Decreased activity tolerance;Decreased mobility;Decreased coordination;Decreased balance;Decreased knowledge of use of DME       PT Treatment Interventions DME instruction;Functional mobility training;Balance training;Patient/family education;Therapeutic activities;Gait training;Therapeutic exercise    PT Goals (Current goals can be found in the Care Plan section)  Acute Rehab PT Goals Patient Stated Goal: to fix legs PT Goal Formulation: With patient Time For Goal Achievement: 11/03/19 Potential to Achieve Goals: Good    Frequency Min 3X/week   Barriers to discharge        Co-evaluation               AM-PAC PT "6 Clicks" Mobility  Outcome Measure Help needed turning from your back to your side while in a flat bed without using bedrails?: A Little Help needed moving from lying on your back to sitting on the side of a flat bed without using bedrails?: A Lot Help needed moving to and from a bed to a chair (including a wheelchair)?: A Lot Help needed standing up from a chair using your arms (e.g., wheelchair or bedside chair)?: A Lot Help needed to walk in hospital room?: Total Help needed climbing 3-5 steps with a railing? : Total 6 Click Score: 11    End of Session Equipment Utilized During  Treatment: Gait belt Activity Tolerance: Patient limited by fatigue Patient left: in bed;with bed alarm set;with nursing/sitter in room Nurse Communication: Mobility status PT Visit Diagnosis: Other abnormalities of gait and mobility (R26.89);History of falling (Z91.81)    Time: 1108-1140 PT Time Calculation (min) (ACUTE ONLY): 32 min   Charges:   PT Evaluation $PT Eval Moderate Complexity: 1 Mod PT Treatments $Therapeutic Activity: 8-22 mins        Kari Robinson, [place] [facility] [phone] 10/20/2019   Kari Robinson 10/20/2019, 12:56 PM

## 2019-10-20 NOTE — Progress Notes (Signed)
CCMD called to report an 8 beat run of v-tach. Entered room to find pt had transferred herself from the bedside commode to bed citing pain from the seat. Educated patient on importance of calling before ambulating. Assisted patient in repositioning and turned on bed alarm. Patient is otherwise in no acute distress. Will continue to monitor.

## 2019-10-20 NOTE — NC FL2 (Signed)
[place] LEVEL OF CARE SCREENING TOOL     IDENTIFICATION  Patient Name: Kari Robinson Birthdate: [birth year] Sex: female Admission Date (Current Location): 10/19/2019  [facility] and [place] Number:  Herbalist and Address:  [facility] [place], [place] [address], [place], [place] [address]      Provider Number: [id number]  Attending Physician Name and Address:  Geradine Girt, DO  Relative Name and Phone Number:  [name] (son) [phone]    Current Level of Care: Hospital Recommended Level of Care: [place] Prior Approval Number:    Date Approved/Denied:   PASRR Number:    Discharge Plan: SNF    Current Diagnoses: Patient Active Problem List   Diagnosis Date Noted  . Chest pain with moderate risk for cardiac etiology, hx CAD 10/19/2019  . Elevated troponin 10/19/2019  . Hyperkalemia 10/19/2019  . Pneumonia 07/24/2019  . Dyspnea 07/24/2019  . Lumbar stenosis 05/09/2019  . Leg weakness, bilateral 04/01/2019  . Tobacco abuse 04/01/2019  . Alcohol abuse 04/01/2019  . ESRD (end stage renal disease) on dialysis ([place]) 02/24/2019  . Seizure-like activity ([place]) 02/24/2019  . Ischemic cardiomyopathy 02/24/2019  . Type II diabetes mellitus with renal manifestations ([place]) 09/30/2018  . HLD (hyperlipidemia) 09/30/2018  . Depression with anxiety 09/30/2018  . Anemia of chronic disease 09/30/2018  . COPD (chronic obstructive pulmonary disease) ([place])   . Solitary right kidney 06/24/2018  . Chronic systolic CHF (congestive heart failure) ([place]) 06/24/2018  . CKD (chronic kidney disease) stage 5, GFR less than 15 ml/min ([name]) 06/07/2018  . Normocytic normochromic anemia 06/07/2018  . DM (diabetes mellitus), type 2 with renal complications ([place]) 24/07/7352  . Occlusion and stenosis of carotid artery without mention of cerebral infarction 01/19/2012  . RENAL ARTERY STENOSIS 09/10/2010  . DEPRESSION 08/11/2010  . PERIPHERAL  VASCULAR DISEASE 08/11/2010  . Essential hypertension 08/08/2010  . Coronary atherosclerosis 08/08/2010    Orientation RESPIRATION BLADDER Height & Weight     Self, Time, Situation, Place  O2(nasal cannula 3L/min) Continent, External catheter Weight: 166 lb 10.7 oz (75.6 kg) Height:  5\' 4"  (162.6 cm)  BEHAVIORAL SYMPTOMS/MOOD NEUROLOGICAL BOWEL NUTRITION STATUS      Continent Diet(see discharge summary)  AMBULATORY STATUS COMMUNICATION OF NEEDS Skin   Limited Assist Verbally Other (Comment)(ecchymosis arms and legs)                       Personal Care Assistance Level of Assistance  Bathing, Feeding, Dressing, Total care Bathing Assistance: Limited assistance Feeding assistance: Independent Dressing Assistance: Limited assistance Total Care Assistance: Limited assistance   Functional Limitations Info  Sight, Hearing, Speech Sight Info: Impaired Hearing Info: Adequate Speech Info: Adequate    SPECIAL CARE FACTORS FREQUENCY  PT (By licensed PT), OT (By licensed OT)     PT Frequency: min 5x weekly OT Frequency: min 5x weekly            Contractures Contractures Info: Not present    Additional Factors Info  Code Status, Allergies Code Status Info: full Allergies Info: Citalopram Hydrobromide, Morphine and related, tape           Current Medications (10/20/2019):  This is the current hospital active medication list Current Facility-Administered Medications  Medication Dose Route Frequency Provider Last Rate Last Dose  . acetaminophen (TYLENOL) tablet 650 mg  650 mg Oral Q4H PRN Fuller Plan A, MD      . aspirin EC tablet 81  mg  81 mg Oral Daily Fuller Plan A, MD   81 mg at 10/20/19 0904  . atorvastatin (LIPITOR) tablet 40 mg  40 mg Oral Daily Fuller Plan A, MD   40 mg at 10/20/19 0903  . buPROPion (WELLBUTRIN SR) 12 hr tablet 150 mg  150 mg Oral BID Fuller Plan A, MD   150 mg at 10/20/19 0902  . Chlorhexidine Gluconate Cloth 2 % PADS 6 each  6 each  Topical Q0600 Loren Racer, PA-C   6 each at 10/20/19 2563  . clopidogrel (PLAVIX) tablet 75 mg  75 mg Oral Daily Fuller Plan A, MD   75 mg at 10/20/19 0902  . ferric citrate (AURYXIA) tablet 420 mg  420 mg Oral With snacks Fuller Plan A, MD   420 mg at 10/20/19 0019  . ferric citrate (AURYXIA) tablet 630 mg  630 mg Oral TID WC Smith, Rondell A, MD   630 mg at 10/20/19 1258  . heparin injection 5,000 Units  5,000 Units Subcutaneous Q8H Norval Morton, MD   Stopped at 10/20/19 [phone]  . lidocaine-prilocaine (EMLA) cream 1 application  1 application Topical Q M,W,F-HD Smith, Rondell A, MD      . metoprolol succinate (TOPROL-XL) 24 hr tablet 50 mg  50 mg Oral Daily Tamala Julian, Rondell A, MD   50 mg at 10/20/19 0903  . mometasone-formoterol (DULERA) 200-5 MCG/ACT inhaler 2 puff  2 puff Inhalation BID Norval Morton, MD   2 puff at 10/20/19 [phone]  . ondansetron (ZOFRAN) injection 4 mg  4 mg Intravenous Q6H PRN Smith, Rondell A, MD      . QUEtiapine (SEROQUEL) tablet 200 mg  200 mg Oral QHS Smith, Rondell A, MD   200 mg at 10/20/19 0012     Discharge Medications: Please see discharge summary for a list of discharge medications.  Relevant Imaging Results:  Relevant Lab Results:   Additional Information SSN: [id number]  Alberteen Sam, LCSW

## 2019-10-20 NOTE — Progress Notes (Addendum)
CCMD called to report 15 beat run of vtach. Patient continues to be in no acute distress. A new set of vitals is being obtained which will be added as an addendum as they are finalized. Cardiology paged. Will continue to monitor.  BP 119/84, HR 90, O2 100, 98.2 F, 21RR

## 2019-10-20 NOTE — Progress Notes (Signed)
Internal medicine made aware of pt vtach episodes. Will continue to monitor.

## 2019-10-20 NOTE — Progress Notes (Signed)
[facility] ROUNDING NOTE   Subjective:   This is a 68 year old lady end-stage renal disease at [facility].  She has a history of COPD as well as a history of lung cancer status post surgical excision history of coronary artery disease status post stent being admitted with chest pain dyspnea having not been to dialysis 10/16/2019.  She was found to be hyperkalemic and received dialysis 10/19/2019.  1.1 L ultrafiltration  Blood pressure 106/83 pulse 84 temperature 97.7 O2 sats 96% nasal cannula 3 L  Sodium 137 potassium 4.1 chloride 96 CO2 26 BUN 36 creatinine 7.59 calcium 8.9 phosphorus 3.8 albumin 3.1 WBC 6.3 hemoglobin 10.3 platelets 189  Aspirin 81 mg daily atorvastatin 40 mg daily Wellbutrin 150 mg twice daily Plavix 75 mg daily Auryxia with meals, metoprolol 50 mg daily Seroquel 200 mg nightly Dulera inhaler 2 puffs twice daily  MRI brain large chronic hemorrhagic infarct right frontal lobe right basal ganglia moderate chronic small vessel disease  Objective:  Vital signs in last 24 hours:  Temp:  [97.6 F (36.4 C)-99.6 F (37.6 C)] 97.7 F (36.5 C) (12/04 0811) Pulse Rate:  [70-107] 70 (12/04 0811) Resp:  [17-28] 17 (12/04 0811) BP: (106-161)/(48-100) 106/83 (12/04 0811) SpO2:  [93 %-100 %] 96 % (12/04 0811) Weight:  [74 kg-75.6 kg] 75.6 kg (12/04 0513)  Weight change:  Filed Weights   10/19/19 2059 10/19/19 2309 10/20/19 0513  Weight: 75 kg 74 kg 75.6 kg    Intake/Output: I/O last 3 completed shifts: In: 240 [P.O.:240] Out: 1100 [Urine:100; Other:1000]   Intake/Output this shift:  Total I/O In: 240 [P.O.:240] Out: -   Well-developed nondistressed  CVS- RRR faint systolic murmur JVP not elevated RS-some scattered wheezes heard throughout the lung fields ABD- BS present soft non-distended EXT- no edema right forearm AV fistula   Basic Metabolic Panel: Recent Labs  Lab 10/17/19 2240 10/18/19 0850 10/19/19 0225 10/20/19 0537   NA 140  --  138 137  K 5.2*  --  5.2* 4.1  CL 93*  --  94* 96*  CO2 31  --  30 26  GLUCOSE 150*  --  139* 87  BUN 39*  --  58* 36*  CREATININE 8.18*  --  10.48* 7.59*  CALCIUM 10.0  --  8.9 8.9  MG  --  3.1*  --   --   PHOS  --   --   --  3.8    Liver Function Tests: Recent Labs  Lab 10/18/19 0850 10/20/19 0537  AST 30  --   ALT 14  --   ALKPHOS 91  --   BILITOT 0.7  --   PROT 6.7  --   ALBUMIN 3.9 3.1*   Recent Labs  Lab 10/18/19 0850  LIPASE 19   No results for input(s): AMMONIA in the last 168 hours.  CBC: Recent Labs  Lab 10/17/19 2240 10/19/19 0225 10/20/19 0734  WBC 9.1 12.0* 6.3  NEUTROABS  --  10.5* 4.7  HGB 11.3* 10.0* 10.3*  HCT 37.1 32.2* 33.1*  MCV 111.4* 110.3* 110.7*  PLT 207 156 189    Cardiac Enzymes: No results for input(s): CKTOTAL, CKMB, CKMBINDEX, TROPONINI in the last 168 hours.  BNP: Invalid input(s): POCBNP  CBG: Recent Labs  Lab 10/19/19 1655  GLUCAP 105*    Microbiology: Results for orders placed or performed during the hospital encounter of 10/19/19  SARS CORONAVIRUS 2 (TAT 6-24 HRS) Nasopharyngeal Nasopharyngeal Swab  Status: None   Collection Time: 10/19/19  7:39 AM   Specimen: Nasopharyngeal Swab  Result Value Ref Range Status   SARS Coronavirus 2 NEGATIVE NEGATIVE Final    Comment: (NOTE) SARS-CoV-2 target nucleic acids are NOT DETECTED. The SARS-CoV-2 RNA is generally detectable in upper and lower respiratory specimens during the acute phase of infection. Negative results do not preclude SARS-CoV-2 infection, do not rule out co-infections with other pathogens, and should not be used as the sole basis for treatment or other patient management decisions. Negative results must be combined with clinical observations, patient history, and epidemiological information. The expected result is Negative. Fact Sheet for Patients: SugarRoll.be Fact Sheet for Healthcare  Providers: https://www.woods-mathews.com/ This test is not yet approved or cleared by the [place] FDA and  has been authorized for detection and/or diagnosis of SARS-CoV-2 by FDA under an Emergency Use Authorization (EUA). This EUA will remain  in effect (meaning this test can be used) for the duration of the COVID-19 declaration under Section 56 4(b)(1) of the Act, 21 U.S.C. section 360bbb-3(b)(1), unless the authorization is terminated or revoked sooner. Performed at [facility], [place] [address], [place], [place] [address]     Coagulation Studies: Recent Labs    10/18/19 0850  LABPROT 13.6  INR 1.1    Urinalysis: No results for input(s): COLORURINE, LABSPEC, PHURINE, GLUCOSEU, HGBUR, BILIRUBINUR, KETONESUR, PROTEINUR, UROBILINOGEN, NITRITE, LEUKOCYTESUR in the last 72 hours.  Invalid input(s): APPERANCEUR    Imaging: Mr Brain Wo Contrast (neuro Protocol)  Result Date: 10/18/2019 CLINICAL DATA:  Focal neuro deficit, greater than 6 hours, stroke suspected. New aphasia since yesterday. Additional provided: Tremors and difficulty speaking which generalized weakness. EXAM: MRI HEAD WITHOUT CONTRAST TECHNIQUE: Multiplanar, multiecho pulse sequences of the brain and surrounding structures were obtained without intravenous contrast. COMPARISON:  Report from brain MRI 04/01/2019 (images currently unavailable), brain MRI 02/25/2019, head CT 02/24/2019 FINDINGS: Brain: Punctate focus of subtle apparent restricted diffusion within the right frontal lobe white matter (seen only on axial diffusion-weighted imaging)(series 2, image 38). This may reflect image noise or a tiny early subacute infarct. No evidence of recent infarct elsewhere within the brain. No evidence of intracranial mass. No midline shift or extra-axial fluid collection. Again demonstrated is a large region of encephalomalacia and associated chronic blood products within the right frontal lobe and right  basal ganglia, reflecting a chronic hemorrhagic infarct. Redemonstrated chronic lacunar infarct within the left corona radiata. Background of moderate chronic small vessel ischemic disease. Cerebral volume is normal for age. Vascular: Lack of expected flow void within the intracranial left vertebral artery, unchanged from prior exams and likely reflecting chronic occlusion. Flow voids otherwise preserved within the proximal large arterial vessels. Skull and upper cervical spine: No focal marrow lesion Sinuses/Orbits: Visualized orbits demonstrate no acute abnormality. Minimal ethmoid sinus mucosal thickening. Trace fluid within right mastoid air cells. IMPRESSION: 1. Image noise artifact versus punctate early subacute infarct within the right frontal lobe white matter. 2. 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 3. Moderate chronic small vessel ischemic disease. 4. 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 Electronically Signed   By: Kellie Simmering DO   On: 10/18/2019 11:48   Dg Chest Portable 1 View  Result Date: 10/19/2019 CLINICAL DATA:  Shortness of breath EXAM: PORTABLE CHEST 1 VIEW COMPARISON:  08/07/2019 FINDINGS: There is hyperinflation of the lungs compatible with COPD. Cardiomegaly. Aortic atherosclerosis. No confluent airspace opacities,  effusions or edema. No acute bony abnormality. IMPRESSION: Cardiomegaly, COPD.  No acute findings. Electronically Signed   By: Rolm Baptise M.D.   On: 10/19/2019 02:55     Medications:    . aspirin EC  81 mg Oral Daily  . atorvastatin  40 mg Oral Daily  . buPROPion  150 mg Oral BID  . Chlorhexidine Gluconate Cloth  6 each Topical Q0600  . clopidogrel  75 mg Oral Daily  . ferric citrate  420 mg Oral With snacks  . ferric citrate  630 mg Oral TID WC  . heparin  5,000 Units Subcutaneous Q8H  . lidocaine-prilocaine  1 application Topical Q M,W,F-HD  .  metoprolol succinate  50 mg Oral Daily  . mometasone-formoterol  2 puff Inhalation BID  . QUEtiapine  200 mg Oral QHS   acetaminophen, ondansetron (ZOFRAN) IV  Assessment/ Plan:  1. Dyspnea/COPD exacerbation: Symptoms improved with Dulera inhaler - perhaps needs diff home COPD regimen. 2.  ^ Troponin: Hx CAD with PCI/stent in past. S/p cardiology consult - felt likely demand ischemia. Monitor. 3.  ESRD: MWF sched - missed her last HD.  She had an abbreviated dialysis treatment 10/19/2019 and scheduled for 10/20/2019 4.  Hypertension/volume: BP high today - should improve with HD. 5.  Anemia: Hgb 10 - just given ESA as outpatient 6.  Metabolic bone disease appears to be stable    LOS: 0 Sherril Croon @TODAY @9 :31 AM

## 2019-10-21 LAB — GLUCOSE, CAPILLARY: Glucose-Capillary: 115 mg/dL — ABNORMAL HIGH (ref 70–99)

## 2019-10-21 MED ORDER — ORAL CARE MOUTH RINSE
15.0000 mL | Freq: Two times a day (BID) | OROMUCOSAL | Status: DC
  Administered 2019-10-21 – 2019-10-24 (×4): 15 mL via OROMUCOSAL

## 2019-10-21 NOTE — Progress Notes (Signed)
Physical Therapy Treatment Patient Details Name: Beckett Hickmon MRN: [id number] DOB: Apr 07, 1951 Today's Date: 10/21/2019    History of Present Illness Kari Robinson is a 68 y.o. female with medical history significant of hypertension, hyperlipidemia, ESRD on HD(M/W/F), CAD s/p stents, COPD, oxygen dependent on 3 L of nasal cannula oxygen, tobacco abuse, and lung cancer s/p resection.  She presents with complaints chest pain and shortness of breath.  Also reporting over the last 2 to 3 days she has been having intermittent leg jerking.    PT Comments    Pt tolerated treatment well despite anxiety due to fear of falling and increased work of breathing. Pt continues to require physical assistance during all OOB mobility due to impaired strength and coordination increasing her falls risk. Pt does demonstrate improved activity tolerance and progresses to increased ambulation distances, albeit still very limited. Pt will continue to benefit from acute PT services to reduce falls risk and increase ambulation tolerance.   Follow Up Recommendations  SNF     Equipment Recommendations  None recommended by PT    Recommendations for Other Services       Precautions / Restrictions Precautions Precautions: Fall Restrictions Weight Bearing Restrictions: No    Mobility  Bed Mobility Overal bed mobility: Needs Assistance Bed Mobility: Supine to Sit     Supine to sit: Supervision;HOB elevated        Transfers Overall transfer level: Needs assistance Equipment used: Rolling walker (2 wheeled) Transfers: Sit to/from Stand Sit to Stand: Min assist;Mod assist(min-modA during various trials)         General transfer comment: PT providing verbal cues for hand placement, foot placement, and rocking for momentum  Ambulation/Gait Ambulation/Gait assistance: Mod assist Gait Distance (Feet): 5 Feet Assistive device: Rolling walker (2 wheeled) Gait Pattern/deviations: Step-to  pattern;Shuffle Gait velocity: reduced Gait velocity interpretation: <1.31 ft/sec, indicative of household ambulator General Gait Details: pt with short step to gait with reduced foot clearance bilaterally. Pt with tremors in BLE at times during standing and seated activity, pt frustrated by uncontrolled movements   Stairs             Wheelchair Mobility    Modified Rankin (Stroke Patients Only)       Balance Overall balance assessment: Needs assistance Sitting-balance support: Feet supported;No upper extremity supported Sitting balance-Leahy Scale: Good Sitting balance - Comments: close supervision   Standing balance support: Bilateral upper extremity supported Standing balance-Leahy Scale: Poor Standing balance comment: min-modA BUE support                            Cognition Arousal/Alertness: Awake/alert Behavior During Therapy: WFL for tasks assessed/performed Overall Cognitive Status: Within Functional Limits for tasks assessed                                        Exercises      General Comments General comments (skin integrity, edema, etc.): anxious about falling and breathing. pt on 2L [place]      Pertinent Vitals/Pain Pain Assessment: Faces Faces Pain Scale: Hurts even more Pain Location: abdomen Pain Descriptors / Indicators: Aching Pain Intervention(s): Limited activity within patient's tolerance    Home Living                      Prior Function  PT Goals (current goals can now be found in the care plan section) Acute Rehab PT Goals Patient Stated Goal: to fix legs Progress towards PT goals: Progressing toward goals    Frequency    Min 3X/week      PT Plan Current plan remains appropriate    Co-evaluation              AM-PAC PT "6 Clicks" Mobility   Outcome Measure  Help needed turning from your back to your side while in a flat bed without using bedrails?: None Help needed  moving from lying on your back to sitting on the side of a flat bed without using bedrails?: None Help needed moving to and from a bed to a chair (including a wheelchair)?: A Lot Help needed standing up from a chair using your arms (e.g., wheelchair or bedside chair)?: A Lot Help needed to walk in hospital room?: A Lot Help needed climbing 3-5 steps with a railing? : Total 6 Click Score: 15    End of Session Equipment Utilized During Treatment: Oxygen Activity Tolerance: Patient tolerated treatment well Patient left: in chair;with call bell/phone within reach;with chair alarm set Nurse Communication: Mobility status PT Visit Diagnosis: Other abnormalities of gait and mobility (R26.89);History of falling (Z91.81)     Time: 2446-9507 PT Time Calculation (min) (ACUTE ONLY): 30 min  Charges:  $Gait Training: 8-22 mins $Therapeutic Activity: 8-22 mins                     Zenaida Niece, PT, DPT Acute Rehabilitation Pager: [phone]    Zenaida Niece 10/21/2019, 5:44 PM

## 2019-10-21 NOTE — Plan of Care (Signed)

## 2019-10-21 NOTE — Progress Notes (Signed)
[facility] ROUNDING NOTE   Subjective:   This is a 68 year old lady end-stage renal disease at [facility].  She has a history of COPD as well as a history of lung cancer status post surgical excision history of coronary artery disease status post stent being admitted with chest pain dyspnea having not been to dialysis 10/16/2019.  She was found to be hyperkalemic and received dialysis 10/19/2019.  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 right basal ganglia moderate chronic small vessel disease  Objective:  Vital signs in last 24 hours:  Temp:  [97.5 F (36.4 C)-99.2 F (37.3 C)] 98 F (36.7 C) (12/05 0743) Pulse Rate:  [61-97] 77 (12/05 0743) Resp:  [18-24] 20 (12/05 0743) BP: (91-137)/(56-84) 102/79 (12/05 0743) SpO2:  [95 %-100 %] 100 % (12/05 0743) Weight:  [66.6 kg-74.4 kg] 66.6 kg (12/05 0436)  Weight change: -0.6 kg Filed Weights   10/20/19 1400 10/20/19 1740 10/21/19 0436  Weight: 74.4 kg 71 kg 66.6 kg    Intake/Output: I/O last 3 completed shifts: In: 620 [P.O.:620] Out: 2935 [Other:2935]   Intake/Output this shift:  No intake/output data recorded.  Well-developed nondistressed  CVS- RRR faint systolic murmur JVP not elevated RS-some scattered wheezes heard throughout the lung fields ABD- BS present soft non-distended EXT- no edema right forearm AV fistula   Basic Metabolic Panel: Recent Labs  Lab 10/17/19 2240  10/18/19 0850 10/19/19 0225 10/20/19 0537  NA 140  --  138 137  K 5.2*  --  5.2* 4.1  CL 93*  --  94* 96*  CO2 31  --  30 26  GLUCOSE 150*  --  139* 87  BUN 39*  --  58* 36*  CREATININE 8.18*  --  10.48* 7.59*  CALCIUM 10.0  --  8.9 8.9  MG  --  3.1*  --   --   PHOS  --   --   --  3.8    Liver Function Tests: Recent Labs  Lab 10/18/19 0850 10/20/19 0537  AST 30  --   ALT 14  --   ALKPHOS 91  --   BILITOT 0.7  --   PROT 6.7  --   ALBUMIN 3.9 3.1*   Recent Labs  Lab 10/18/19 0850  LIPASE 19   Recent Labs  Lab 10/20/19 1330  AMMONIA 13    CBC: Recent Labs  Lab 10/17/19 2240 10/19/19 0225 10/20/19 0734  WBC 9.1 12.0* 6.3  NEUTROABS  --  10.5* 4.7  HGB 11.3* 10.0* 10.3*  HCT 37.1 32.2* 33.1*  MCV 111.4* 110.3* 110.7*  PLT 207 156 189    Cardiac Enzymes: No results for input(s): CKTOTAL, CKMB, CKMBINDEX, TROPONINI in the last 168 hours.  BNP: Invalid input(s): POCBNP  CBG: Recent Labs  Lab 10/19/19 1655  GLUCAP 105*    Microbiology: Results for orders placed or performed during the hospital encounter of 10/19/19  SARS CORONAVIRUS 2 (TAT 6-24 HRS) Nasopharyngeal Nasopharyngeal  Swab     Status: None   Collection Time: 10/19/19  7:39 AM   Specimen: Nasopharyngeal Swab  Result Value Ref Range Status   SARS Coronavirus 2 NEGATIVE NEGATIVE Final    Comment: (NOTE) SARS-CoV-2 target nucleic acids are NOT DETECTED. The SARS-CoV-2 RNA is generally detectable in upper and lower respiratory specimens during the acute phase of infection. Negative results do not preclude SARS-CoV-2 infection, do not rule out co-infections with other pathogens, and should not be used as the sole basis for treatment or other patient management decisions. Negative results must be combined with clinical observations, patient history, and epidemiological information. The expected result is Negative. Fact Sheet for Patients: SugarRoll.be Fact  Sheet for Healthcare Providers: https://www.woods-mathews.com/ This test is not yet approved or cleared by the [place] FDA and  has been authorized for detection and/or diagnosis of SARS-CoV-2 by FDA under an Emergency Use Authorization (EUA). This EUA will remain  in effect (meaning this test can be used) for the duration of the COVID-19 declaration under Section 56 4(b)(1) of the Act, 21 U.S.C. section 360bbb-3(b)(1), unless the authorization is terminated or revoked sooner. Performed at [facility], [place] [address] [place], [place] [address]     Coagulation Studies: Recent Labs    10/18/19 0850  LABPROT 13.6  INR 1.1    Urinalysis: No results for input(s): COLORURINE, LABSPEC, PHURINE, GLUCOSEU, HGBUR, BILIRUBINUR, KETONESUR, PROTEINUR, UROBILINOGEN, NITRITE, LEUKOCYTESUR in the last 72 hours.  Invalid input(s): APPERANCEUR    Imaging: No results found.   Medications:    . aspirin EC  81 mg Oral Daily  . atorvastatin  40 mg Oral Daily  . buPROPion  150 mg Oral BID  . Chlorhexidine Gluconate Cloth  6 each Topical Q0600  . clopidogrel  75 mg Oral Daily  . ferric citrate  420 mg Oral With snacks  . ferric citrate  630 mg Oral TID WC  . heparin  5,000 Units Subcutaneous Q8H  . lidocaine-prilocaine  1 application Topical Q M,W,F-HD  . mouth rinse  15 mL Mouth Rinse BID  . metoprolol succinate  50 mg Oral Daily  . mometasone-formoterol  2 puff Inhalation BID  . QUEtiapine  200 mg Oral QHS   acetaminophen, ondansetron (ZOFRAN) IV  Assessment/ Plan:  1. Dyspnea/COPD exacerbation: Symptoms improved with Dulera inhaler - perhaps needs diff home COPD regimen. 2.  ^ Troponin: Hx CAD with PCI/stent in past. S/p cardiology consult - felt likely demand ischemia. Monitor. 3.  ESRD: MWF sched - missed her last HD.  She had an abbreviated dialysis treatment 10/19/2019 and a successful dialysis treatment 10/20/2019 with removal of 1.9 L.  She is scheduled  for dialysis again on 10/23/2019 4.  Hypertension/volume: BP high today - should improve with HD. 5.  Anemia: Hgb 10 - just given ESA as outpatient 6.  Metabolic bone disease appears to be stable    LOS: 1 Sherril Croon @TODAY @8 :43 AM

## 2019-10-21 NOTE — Progress Notes (Addendum)
Progress Note    Kari Robinson  ENI:[id number] DOB: Apr 13, 1951  DOA: 10/19/2019 PCP: Tsosie Billing, MD (Inactive)    Brief Narrative:     Medical records reviewed and are as summarized below:  Kari Robinson is an 68 y.o. female with medical history significant of hypertension, hyperlipidemia, ESRD on HD(M/W/F), CAD s/p stents, COPD, oxygen dependent on 3 L of nasal cannula oxygen, tobacco abuse, and lung cancer s/p resection.  She presents with complaints chest pain and shortness of breath.  Symptoms woke her out of her sleep this morning.  Pain was described as pressure-like and localized to the lower substernal region.  Denies any radiation of pain, leg swelling, change in weight, or fever. Associated symptoms of nausea and had episode of vomiting once here at the emergency department.  She has had 3 previous stents placed and thinks her last heart cath was in [place] in 2014/2015.  Patient was seen in the emergency department yesterday for word finding difficulty and shakiness.   Assessment/Plan:   Active Problems:   Essential hypertension   DM (diabetes mellitus), type 2 with renal complications ([name])   Chronic systolic CHF (congestive heart failure) ([name])   COPD (chronic obstructive pulmonary disease) ([name])   Type II diabetes mellitus with renal manifestations ([name])   HLD (hyperlipidemia)   ESRD (end stage renal disease) on dialysis ([name])   Chest pain with moderate risk for cardiac etiology, hx CAD   Elevated troponin   Hyperkalemia   Weakness   Chest pain, elevated troponin: Acute.  Patient presents with complaints of chest pain and shortness of breath.  High-sensitivity troponin elevated at 80->96.  Patient reports chest pain resolved with nitroglycerin.  Cardiology was formally consulted. -Continue aspirin and Plavix -Cardiology consulted: plan for medical management  Chronic Respiratory failure with hypoxia, COPD: -At baseline patient on 3 L of oxygen  at home -Continue albuterol inhaler and pharmacy substitution for Symbicort  ESRD on HD, hyperkalemia: On admission patient presents with potassium 5.2.  She normally dialyzes Monday, Wednesday, Friday.  Reported missing her regularly scheduled hemodialysis yesterday as she was here at the hospital. -Continue renal diet with fluid restriction -Nephrology consulted for HD-- s/p 2 sessions, not due again until 61/4  Chronic systolic congestive heart failure: Last EF noted to be 40 to 45% by echo in 06/17/2019. -Strict I&Os -Daily weights -volume managed by HD  Leukocytosis:  -was reactive-- resolved  Essential hypertension -Continue metoprolol  Depression -Continue Wellbutrin and Seroquel  Hyperlipidemia -Continue atorvastatin  Tobacco abuse: Patient reports approximately having a 60 smoking pack year history, but recently quit approximately 2 months ago.  -Encourage continued cessation of tobacco use  Deconditioning -PT has seen and recommends SNF -? [facility]-  ammonia level normal, none seen on exam today  Reported history of DM but last HgbA1c was 4.9   Family Communication/Anticipated D/C date and plan/Code Status   DVT prophylaxis: heparin Code Status: Full Code.  Family Communication:  Disposition Plan: seen by PT- recommend SNF   Medical Consultants:    Cards  renal  Subjective:   Thinks she needs to go to rehab  Objective:    Vitals:   10/21/19 0440 10/21/19 0640 10/21/19 0743 10/21/19 0840  BP: 91/75 113/70 102/79   Pulse: 80 77 77   Resp: 20  20   Temp: 97.9 F (36.6 C) 97.8 F (36.6 C) 98 F (36.7 C)   TempSrc: Oral Oral Oral   SpO2: 100% 100% 100% 100%  Weight:      Height:        Intake/Output Summary (Last 24 hours) at 10/21/2019 1230 Last data filed at 10/21/2019 0800 Gross per 24 hour  Intake 620 ml  Output 1935 ml  Net -1315 ml   Filed Weights   10/20/19 1400 10/20/19 1740 10/21/19 0436  Weight: 74.4 kg 71 kg 66.6 kg     Exam: In bed, NAD rrr No increased work of breathing, diminished Pleasant and cooperative, poor insight into disease processed   Data Reviewed:   I have personally reviewed following labs and imaging studies:  Labs: Labs show the following:   Basic Metabolic Panel: Recent Labs  Lab 10/17/19 2240 10/18/19 0850 10/19/19 0225 10/20/19 0537  NA 140  --  138 137  K 5.2*  --  5.2* 4.1  CL 93*  --  94* 96*  CO2 31  --  30 26  GLUCOSE 150*  --  139* 87  BUN 39*  --  58* 36*  CREATININE 8.18*  --  10.48* 7.59*  CALCIUM 10.0  --  8.9 8.9  MG  --  3.1*  --   --   PHOS  --   --   --  3.8   GFR Estimated Creatinine Clearance: 6.6 mL/min (A) (by C-G formula based on SCr of 7.59 mg/dL (H)). Liver Function Tests: Recent Labs  Lab 10/18/19 0850 10/20/19 0537  AST 30  --   ALT 14  --   ALKPHOS 91  --   BILITOT 0.7  --   PROT 6.7  --   ALBUMIN 3.9 3.1*   Recent Labs  Lab 10/18/19 0850  LIPASE 19   Recent Labs  Lab 10/20/19 1330  AMMONIA 13   Coagulation profile Recent Labs  Lab 10/18/19 0850  INR 1.1    CBC: Recent Labs  Lab 10/17/19 2240 10/19/19 0225 10/20/19 0734  WBC 9.1 12.0* 6.3  NEUTROABS  --  10.5* 4.7  HGB 11.3* 10.0* 10.3*  HCT 37.1 32.2* 33.1*  MCV 111.4* 110.3* 110.7*  PLT 207 156 189   Cardiac Enzymes: No results for input(s): CKTOTAL, CKMB, CKMBINDEX, TROPONINI in the last 168 hours. BNP (last 3 results) No results for input(s): PROBNP in the last 8760 hours. CBG: Recent Labs  Lab 10/19/19 1655 10/21/19 1129  GLUCAP 105* 115*   D-Dimer: No results for input(s): DDIMER in the last 72 hours. Hgb A1c: No results for input(s): HGBA1C in the last 72 hours. Lipid Profile: No results for input(s): CHOL, HDL, LDLCALC, TRIG, CHOLHDL, LDLDIRECT in the last 72 hours. Thyroid function studies: No results for input(s): TSH, T4TOTAL, T3FREE, THYROIDAB in the last 72 hours.  Invalid input(s): FREET3 Anemia work up: No results for  input(s): VITAMINB12, FOLATE, FERRITIN, TIBC, IRON, RETICCTPCT in the last 72 hours. Sepsis Labs: Recent Labs  Lab 10/17/19 2240 10/19/19 0225 10/20/19 0734  WBC 9.1 12.0* 6.3    Microbiology Recent Results (from the past 240 hour(s))  SARS CORONAVIRUS 2 (TAT 6-24 HRS) Nasopharyngeal Nasopharyngeal Swab     Status: None   Collection Time: 10/19/19  7:39 AM   Specimen: Nasopharyngeal Swab  Result Value Ref Range Status   SARS Coronavirus 2 NEGATIVE NEGATIVE Final    Comment: (NOTE) SARS-CoV-2 target nucleic acids are NOT DETECTED. The SARS-CoV-2 RNA is generally detectable in upper and lower respiratory specimens during the acute phase of infection. Negative results do not preclude SARS-CoV-2 infection, do not rule out co-infections with other pathogens, and  should not be used as the sole basis for treatment or other patient management decisions. Negative results must be combined with clinical observations, patient history, and epidemiological information. The expected result is Negative. Fact Sheet for Patients: SugarRoll.be Fact Sheet for Healthcare Providers: https://www.woods-mathews.com/ This test is not yet approved or cleared by the [place] FDA and  has been authorized for detection and/or diagnosis of SARS-CoV-2 by FDA under an Emergency Use Authorization (EUA). This EUA will remain  in effect (meaning this test can be used) for the duration of the COVID-19 declaration under Section 56 4(b)(1) of the Act, 21 U.S.C. section 360bbb-3(b)(1), unless the authorization is terminated or revoked sooner. Performed at [facility], [place] [address] [place], [place] [address]     Procedures and diagnostic studies:  No results found.  Medications:   . aspirin EC  81 mg Oral Daily  . atorvastatin  40 mg Oral Daily  . buPROPion  150 mg Oral BID  . Chlorhexidine Gluconate Cloth  6 each Topical Q0600  . clopidogrel  75 mg  Oral Daily  . ferric citrate  420 mg Oral With snacks  . ferric citrate  630 mg Oral TID WC  . heparin  5,000 Units Subcutaneous Q8H  . lidocaine-prilocaine  1 application Topical Q M,W,F-HD  . mouth rinse  15 mL Mouth Rinse BID  . metoprolol succinate  50 mg Oral Daily  . mometasone-formoterol  2 puff Inhalation BID  . QUEtiapine  200 mg Oral QHS   Continuous Infusions:   LOS: 1 day   Geradine Girt  [facility]   How to contact the [facility] Attending or Consulting provider [place] or covering provider during after hours [place], for this patient?  1. Check the care team in [facility] and look for a) attending/consulting TRH provider listed and b) the [facility] team listed 2. Log into [email or web address] and use [place] universal password to access. If you do not have the password, please contact the hospital operator. 3. Locate the [facility] provider you are looking for under [facility] and page to a number that you can be directly reached. 4. If you still have difficulty reaching the provider, please page the [facility] (Director on Call) for the Hospitalists listed on amion for assistance.  10/21/2019, 12:30 PM

## 2019-10-21 NOTE — Progress Notes (Signed)
Received call from [place]. Pt undergoing instances of bigeminy and trigeminy pvcs. Pt sleeping and in no acute distress. Will continue to monitor.

## 2019-10-21 NOTE — TOC Initial Note (Signed)
Transition of Care [facility]) - Initial/Assessment Note    Patient Details  Name: Kari Robinson MRN: [id number] Date of Birth: 07/29/1951  Transition of Care [facility]) CM/SW Contact:    Bary Castilla, LCSW Phone Number: [phone] 10/21/2019, 12:32 PM  Clinical Narrative:                  CSW met with patient bedside to discuss the recommendation of a SNF. Patient became tearful when the recommendation was mentioned due to her having a bad experience. Patient informed CSW that she would like to return [facility] and has tried [place] in the past and was pleased with their services. CSW inquired about care support at home and patient stated that she lives with her fiance and he will be there to support her.  TOC will continue to follow for discharge planning needs.Expected Discharge Plan: [place] Barriers to Discharge: Continued Medical Work up   Patient Goals and CMS Choice Patient states their goals for this hospitalization and ongoing recovery are:: To  bea ble to walk again      Expected Discharge Plan and Services Expected Discharge Plan: [place]       Living arrangements for the past 2 months: Apartment                                      Prior Living Arrangements/Services Living arrangements for the past 2 months: Apartment Lives with:: Self, Significant Other Patient language and need for interpreter reviewed:: Yes                 Activities of Daily Living Home Assistive Devices/Equipment: Environmental consultant (specify type) ADL Screening (condition at time of admission) Patient's cognitive ability adequate to safely complete daily activities?: Yes Is the patient deaf or have difficulty hearing?: No Does the patient have difficulty seeing, even when wearing glasses/contacts?: No Does the patient have difficulty concentrating, remembering, or making decisions?: No Patient able to express need for assistance with ADLs?: Yes Does the  patient have difficulty dressing or bathing?: No Independently performs ADLs?: Yes (appropriate for developmental age) Does the patient have difficulty walking or climbing stairs?: Yes Weakness of Legs: Both Weakness of Arms/Hands: None  Permission Sought/Granted   Permission granted to share information with : Yes, Verbal Permission Granted  Share Information with NAME: Maurie Boettcher  Permission granted to share info w AGENCY: [place] granted to share info w Relationship: Son  Permission granted to share info w Contact Information: [phone]  Emotional Assessment Appearance:: Appears stated age Attitude/Demeanor/Rapport: Crying Affect (typically observed): Anxious, Accepting Orientation: : Oriented to Self, Oriented to Place, Oriented to  Time, Oriented to Situation      Admission diagnosis:  Chest Pain, [facility] Patient Active Problem List   Diagnosis Date Noted  . Weakness 10/20/2019  . Chest pain with moderate risk for cardiac etiology, hx CAD 10/19/2019  . Elevated troponin 10/19/2019  . Hyperkalemia 10/19/2019  . Pneumonia 07/24/2019  . Dyspnea 07/24/2019  . Lumbar stenosis 05/09/2019  . Leg weakness, bilateral 04/01/2019  . Tobacco abuse 04/01/2019  . Alcohol abuse 04/01/2019  . ESRD (end stage renal disease) on dialysis ([place]) 02/24/2019  . Seizure-like activity ([place]) 02/24/2019  . Ischemic cardiomyopathy 02/24/2019  . Type II diabetes mellitus with renal manifestations ([place]) 09/30/2018  . HLD (hyperlipidemia) 09/30/2018  . Depression with anxiety 09/30/2018  .  Anemia of chronic disease 09/30/2018  . COPD (chronic obstructive pulmonary disease) ([place])   . Solitary right kidney 06/24/2018  . Chronic systolic CHF (congestive heart failure) ([place]) 06/24/2018  . CKD (chronic kidney disease) stage 5, GFR less than 15 ml/min ([name]) 06/07/2018  . Normocytic normochromic anemia 06/07/2018  . DM (diabetes mellitus), type 2 with renal complications ([place]) 16/08/9603  .  Occlusion and stenosis of carotid artery without mention of cerebral infarction 01/19/2012  . RENAL ARTERY STENOSIS 09/10/2010  . DEPRESSION 08/11/2010  . PERIPHERAL VASCULAR DISEASE 08/11/2010  . Essential hypertension 08/08/2010  . Coronary atherosclerosis 08/08/2010   PCP:  Tsosie Billing, MD (Inactive) Pharmacy:   [place],  - [address], [address], [place] [address] Phone: [phone] Fax: [phone]  [facility] [phone] [place], [place] - [place] [address] [place] [address] Phone: [phone] Fax: [phone]  [facility] [place], [place] [address] Phone: [phone] Fax: [phone]     Social Determinants of Health (SDOH) Interventions    Readmission Risk Interventions Readmission Risk Prevention Plan 07/04/2019  Transportation Screening Complete  Medication Review ([place]) Complete  HRI or Home Care Consult Complete  Palliative Care Screening Not Applicable  Some recent data might be hidden

## 2019-10-22 DIAGNOSIS — R531 Weakness: Secondary | ICD-10-CM

## 2019-10-22 MED ORDER — CHLORHEXIDINE GLUCONATE CLOTH 2 % EX PADS
6.0000 | MEDICATED_PAD | Freq: Every day | CUTANEOUS | Status: DC
  Administered 2019-10-22 – 2019-10-23 (×2): 6 via TOPICAL

## 2019-10-22 NOTE — Progress Notes (Signed)
Progress Note    Kari Robinson  ONG:[id number] DOB: 06/30/1951  DOA: 10/19/2019 PCP: Tsosie Billing, MD (Inactive)    Brief Narrative:     Medical records reviewed and are as summarized below:  Kari Robinson is an 68 y.o. female with medical history significant of hypertension, hyperlipidemia, ESRD on HD(M/W/F), CAD s/p stents, COPD, oxygen dependent on 3 L of nasal cannula oxygen, tobacco abuse, and lung cancer s/p resection.  She presents with complaints chest pain and shortness of breath.  Symptoms woke her out of her sleep this morning.  Pain was described as pressure-like and localized to the lower substernal region.  Denies any radiation of pain, leg swelling, change in weight, or fever. Associated symptoms of nausea and had episode of vomiting once here at the emergency department.  She has had 3 previous stents placed and thinks her last heart cath was in [place] in 2014/2015.  Patient was seen in the emergency department yesterday for word finding difficulty and shakiness.   Assessment/Plan:   Active Problems:   Essential hypertension   DM (diabetes mellitus), type 2 with renal complications ([name])   Chronic systolic CHF (congestive heart failure) ([name])   COPD (chronic obstructive pulmonary disease) ([name])   Type II diabetes mellitus with renal manifestations ([name])   HLD (hyperlipidemia)   ESRD (end stage renal disease) on dialysis ([name])   Chest pain with moderate risk for cardiac etiology, hx CAD   Elevated troponin   Hyperkalemia   Weakness   Chest pain, elevated troponin: Acute.  Patient presents with complaints of chest pain and shortness of breath.  High-sensitivity troponin elevated at 80->96.  Patient reports chest pain resolved with nitroglycerin.  Cardiology was formally consulted. -Continue aspirin and Plavix -Cardiology consulted: plan for medical management  Chronic Respiratory failure with hypoxia, COPD: -At baseline patient on 3 L of oxygen  at home -Continue albuterol inhaler and pharmacy substitution for Symbicort  ESRD on HD, hyperkalemia: On admission patient presents with potassium 5.2.  She normally dialyzes Monday, Wednesday, Friday.  Reported missing her regularly scheduled hemodialysis yesterday as she was here at the hospital. -Continue renal diet with fluid restriction -Nephrology consulted for HD-- s/p 2 sessions, not due again until 44/0  Chronic systolic congestive heart failure: Last EF noted to be 40 to 45% by echo in 06/17/2019. -Strict I&Os -Daily weights -volume managed by HD  Leukocytosis:  -was reactive-- resolved  Essential hypertension -Continue metoprolol  Depression -Continue Wellbutrin and Seroquel  Hyperlipidemia -Continue atorvastatin  Tobacco abuse: Patient reports approximately having a 60 smoking pack year history, but recently quit approximately 2 months ago.  -Encourage continued cessation of tobacco use  Deconditioning -PT has seen and recommends SNF -? [facility]-  ammonia level normal, none seen on exam today again  Reported history of DM but last HgbA1c was 4.9   Family Communication/Anticipated D/C date and plan/Code Status   DVT prophylaxis: heparin Code Status: Full Code.  Family Communication:  Disposition Plan: seen by PT- recommend SNF   Medical Consultants:    Cards  renal  Subjective:   Coughing up mucous  Objective:    Vitals:   10/22/19 0407 10/22/19 0500 10/22/19 0828 10/22/19 1058  BP: 127/84   (!) 152/83  Pulse: 71   73  Resp: 20     Temp: (!) 97.5 F (36.4 C)     TempSrc: Oral     SpO2: 100%  93%   Weight:  72.5 kg  Height:        Intake/Output Summary (Last 24 hours) at 10/22/2019 1145 Last data filed at 10/22/2019 0400 Gross per 24 hour  Intake 720 ml  Output 2 ml  Net 718 ml   Filed Weights   10/20/19 1740 10/21/19 0436 10/22/19 0500  Weight: 71 kg 66.6 kg 72.5 kg    Exam: In bed, NAD Coarse breath sounds, improves  with cough rrr Pleasant and cooperative- on O2 No abnormal movements seen such as tardive dyskinesia or asterixis    Data Reviewed:   I have personally reviewed following labs and imaging studies:  Labs: Labs show the following:   Basic Metabolic Panel: Recent Labs  Lab 10/17/19 2240 10/18/19 0850 10/19/19 0225 10/20/19 0537  NA 140  --  138 137  K 5.2*  --  5.2* 4.1  CL 93*  --  94* 96*  CO2 31  --  30 26  GLUCOSE 150*  --  139* 87  BUN 39*  --  58* 36*  CREATININE 8.18*  --  10.48* 7.59*  CALCIUM 10.0  --  8.9 8.9  MG  --  3.1*  --   --   PHOS  --   --   --  3.8   GFR Estimated Creatinine Clearance: 6.8 mL/min (A) (by C-G formula based on SCr of 7.59 mg/dL (H)). Liver Function Tests: Recent Labs  Lab 10/18/19 0850 10/20/19 0537  AST 30  --   ALT 14  --   ALKPHOS 91  --   BILITOT 0.7  --   PROT 6.7  --   ALBUMIN 3.9 3.1*   Recent Labs  Lab 10/18/19 0850  LIPASE 19   Recent Labs  Lab 10/20/19 1330  AMMONIA 13   Coagulation profile Recent Labs  Lab 10/18/19 0850  INR 1.1    CBC: Recent Labs  Lab 10/17/19 2240 10/19/19 0225 10/20/19 0734  WBC 9.1 12.0* 6.3  NEUTROABS  --  10.5* 4.7  HGB 11.3* 10.0* 10.3*  HCT 37.1 32.2* 33.1*  MCV 111.4* 110.3* 110.7*  PLT 207 156 189   Cardiac Enzymes: No results for input(s): CKTOTAL, CKMB, CKMBINDEX, TROPONINI in the last 168 hours. BNP (last 3 results) No results for input(s): PROBNP in the last 8760 hours. CBG: Recent Labs  Lab 10/19/19 1655 10/21/19 1129  GLUCAP 105* 115*   D-Dimer: No results for input(s): DDIMER in the last 72 hours. Hgb A1c: No results for input(s): HGBA1C in the last 72 hours. Lipid Profile: No results for input(s): CHOL, HDL, LDLCALC, TRIG, CHOLHDL, LDLDIRECT in the last 72 hours. Thyroid function studies: No results for input(s): TSH, T4TOTAL, T3FREE, THYROIDAB in the last 72 hours.  Invalid input(s): FREET3 Anemia work up: No results for input(s): VITAMINB12,  FOLATE, FERRITIN, TIBC, IRON, RETICCTPCT in the last 72 hours. Sepsis Labs: Recent Labs  Lab 10/17/19 2240 10/19/19 0225 10/20/19 0734  WBC 9.1 12.0* 6.3    Microbiology Recent Results (from the past 240 hour(s))  SARS CORONAVIRUS 2 (TAT 6-24 HRS) Nasopharyngeal Nasopharyngeal Swab     Status: None   Collection Time: 10/19/19  7:39 AM   Specimen: Nasopharyngeal Swab  Result Value Ref Range Status   SARS Coronavirus 2 NEGATIVE NEGATIVE Final    Comment: (NOTE) SARS-CoV-2 target nucleic acids are NOT DETECTED. The SARS-CoV-2 RNA is generally detectable in upper and lower respiratory specimens during the acute phase of infection. Negative results do not preclude SARS-CoV-2 infection, do not rule out co-infections with other  pathogens, and should not be used as the sole basis for treatment or other patient management decisions. Negative results must be combined with clinical observations, patient history, and epidemiological information. The expected result is Negative. Fact Sheet for Patients: SugarRoll.be Fact Sheet for Healthcare Providers: https://www.woods-mathews.com/ This test is not yet approved or cleared by the [place] FDA and  has been authorized for detection and/or diagnosis of SARS-CoV-2 by FDA under an Emergency Use Authorization (EUA). This EUA will remain  in effect (meaning this test can be used) for the duration of the COVID-19 declaration under Section 56 4(b)(1) of the Act, 21 U.S.C. section 360bbb-3(b)(1), unless the authorization is terminated or revoked sooner. Performed at [facility], [place] [address], [place], [place] [address]     Procedures and diagnostic studies:  No results found.  Medications:   . aspirin EC  81 mg Oral Daily  . atorvastatin  40 mg Oral Daily  . buPROPion  150 mg Oral BID  . Chlorhexidine Gluconate Cloth  6 each Topical Q0600  . Chlorhexidine Gluconate Cloth  6 each  Topical Q0600  . clopidogrel  75 mg Oral Daily  . ferric citrate  420 mg Oral With snacks  . ferric citrate  630 mg Oral TID WC  . heparin  5,000 Units Subcutaneous Q8H  . lidocaine-prilocaine  1 application Topical Q M,W,F-HD  . mouth rinse  15 mL Mouth Rinse BID  . metoprolol succinate  50 mg Oral Daily  . mometasone-formoterol  2 puff Inhalation BID  . QUEtiapine  200 mg Oral QHS   Continuous Infusions:   LOS: 2 days   Geradine Girt  [facility]   How to contact the [facility] Attending or Consulting provider [place] or covering provider during after hours [place], for this patient?  1. Check the care team in [facility] and look for a) attending/consulting TRH provider listed and b) the [facility] team listed 2. Log into [email or web address] and use [place]'s universal password to access. If you do not have the password, please contact the hospital operator. 3. Locate the [facility] provider you are looking for under [facility] and page to a number that you can be directly reached. 4. If you still have difficulty reaching the provider, please page the [facility] (Director on Call) for the Hospitalists listed on amion for assistance.  10/22/2019, 11:45 AM

## 2019-10-22 NOTE — Progress Notes (Signed)
[facility] ROUNDING NOTE   Subjective:   This is a 68 year old lady end-stage renal disease at [facility] kidney center.  She has a history of COPD as well as a history of lung cancer status post surgical excision history of coronary artery disease status post stent being admitted with chest pain dyspnea having not been to dialysis 10/16/2019.  She was found to be hyperkalemic and received dialysis 10/19/2019.  She underwent a regular dialysis treatment 10/20/2023 with 1.9 L removed.  Next dialysis treatment will be 10/23/2019.  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  Well-developed nondistressed  CVS- RRR faint systolic murmur JVP not elevated RS-some scattered wheezes heard throughout the lung fields ABD- BS present soft non-distended EXT- no edema right forearm AV fistula   Basic Metabolic Panel: Recent  Labs  Lab 10/17/19 2240 10/18/19 0850 10/19/19 0225 10/20/19 0537  NA 140  --  138 137  K 5.2*  --  5.2* 4.1  CL 93*  --  94* 96*  CO2 31  --  30 26  GLUCOSE 150*  --  139* 87  BUN 39*  --  58* 36*  CREATININE 8.18*  --  10.48* 7.59*  CALCIUM 10.0  --  8.9 8.9  MG  --  3.1*  --   --   PHOS  --   --   --  3.8    Liver Function Tests: Recent Labs  Lab 10/18/19 0850 10/20/19 0537  AST 30  --   ALT 14  --   ALKPHOS 91  --   BILITOT 0.7  --   PROT 6.7  --   ALBUMIN 3.9 3.1*   Recent Labs  Lab 10/18/19 0850  LIPASE 19   Recent Labs  Lab 10/20/19 1330  AMMONIA 13    CBC: Recent Labs  Lab 10/17/19 2240 10/19/19 0225 10/20/19 0734  WBC 9.1 12.0* 6.3  NEUTROABS  --  10.5* 4.7  HGB 11.3* 10.0* 10.3*  HCT 37.1 32.2* 33.1*  MCV 111.4* 110.3* 110.7*  PLT 207 156 189    Cardiac Enzymes: No results for input(s): CKTOTAL, CKMB, CKMBINDEX, TROPONINI in the last 168 hours.  BNP: Invalid input(s): POCBNP  CBG: Recent Labs  Lab 10/19/19 1655 10/21/19 1129  GLUCAP 105* 115*    Microbiology: Results for orders placed or performed during the hospital encounter of 10/19/19  SARS CORONAVIRUS 2 (TAT 6-24 HRS) Nasopharyngeal Nasopharyngeal Swab     Status: None   Collection Time: 10/19/19  7:39 AM   Specimen: Nasopharyngeal Swab  Result Value Ref Range Status   SARS Coronavirus 2 NEGATIVE NEGATIVE Final    Comment: (NOTE) SARS-CoV-2 target nucleic acids are NOT DETECTED. The SARS-CoV-2 RNA is generally detectable in upper and lower respiratory specimens during the acute phase of infection. Negative results do not preclude SARS-CoV-2 infection, do not rule out co-infections with other pathogens, and should not be used as the sole basis for treatment or other patient management decisions. Negative results must be combined with clinical observations, patient history, and epidemiological information. The expected result is Negative. Fact Sheet for  Patients: SugarRoll.be Fact Sheet for Healthcare Providers: https://www.woods-mathews.com/ This test is not yet approved or cleared by the [place] FDA and  has been authorized for detection and/or diagnosis of SARS-CoV-2 by FDA under an Emergency Use Authorization (EUA). This EUA will remain  in effect (meaning this test can be used) for the duration of the COVID-19 declaration under Section 56 4(b)(1) of the Act, 21 U.S.C. section 360bbb-3(b)(1), unless the authorization is terminated or revoked sooner. Performed at [facility], [place] [address] [place], [place] [address]     Coagulation Studies: No results for input(s): LABPROT, INR in the last 72 hours.  Urinalysis: No results for input(s): COLORURINE, LABSPEC, PHURINE, GLUCOSEU, HGBUR, BILIRUBINUR, KETONESUR, PROTEINUR, UROBILINOGEN, NITRITE, LEUKOCYTESUR in the last 72 hours.  Invalid input(s): APPERANCEUR    Imaging: No results found.   Medications:    . aspirin EC  81 mg Oral Daily  . atorvastatin  40 mg Oral Daily  . buPROPion  150 mg Oral BID  . Chlorhexidine Gluconate Cloth  6 each Topical Q0600  . clopidogrel  75 mg Oral Daily  . ferric citrate  420 mg Oral With snacks  . ferric citrate  630 mg Oral TID WC  . heparin  5,000 Units Subcutaneous Q8H  . lidocaine-prilocaine  1 application Topical Q M,W,F-HD  . mouth rinse  15 mL Mouth Rinse BID  . metoprolol succinate  50 mg Oral Daily  . mometasone-formoterol  2 puff Inhalation BID  . QUEtiapine  200 mg Oral QHS   acetaminophen, ondansetron (ZOFRAN) IV  Assessment/ Plan:  1. Dyspnea/COPD exacerbation: Symptoms improved with Dulera inhaler - perhaps needs diff home COPD regimen. 2.  ^ Troponin: Hx CAD with PCI/stent in past. S/p cardiology consult - felt likely demand ischemia. Monitor.  Continue on aspirin and Plavix. 3.  ESRD: MWF sched - missed her last HD.  She had an abbreviated dialysis treatment  10/19/2019 and a successful dialysis treatment 10/20/2019 with removal of 1.9 L.  She is scheduled for dialysis again on 10/23/2019 4.  Hypertension/volume: BP high today - should improve with HD. 5.  Anemia: Hgb 10 - just given ESA as outpatient 6.  Metabolic bone disease appears to be stable    LOS: 2 Kari Robinson @TODAY @9 :22 AM

## 2019-10-22 NOTE — Plan of Care (Signed)

## 2019-10-23 LAB — HEPATITIS B CORE ANTIBODY, TOTAL: Hep B Core Total Ab: NONREACTIVE

## 2019-10-23 LAB — HEPATITIS PANEL, ACUTE
HCV Ab: NONREACTIVE
Hep A IgM: NONREACTIVE
Hep B C IgM: NONREACTIVE
Hepatitis B Surface Ag: NONREACTIVE

## 2019-10-23 LAB — RENAL FUNCTION PANEL
Albumin: 3.1 g/dL — ABNORMAL LOW (ref 3.5–5.0)
Anion gap: 14 (ref 5–15)
BUN: 58 mg/dL — ABNORMAL HIGH (ref 8–23)
CO2: 26 mmol/L (ref 22–32)
Calcium: 9 mg/dL (ref 8.9–10.3)
Chloride: 90 mmol/L — ABNORMAL LOW (ref 98–111)
Creatinine, Ser: 9.78 mg/dL — ABNORMAL HIGH (ref 0.44–1.00)
GFR calc Af Amer: 4 mL/min — ABNORMAL LOW (ref >60)
GFR calc non Af Amer: 4 mL/min — ABNORMAL LOW (ref >60)
Glucose, Bld: 111 mg/dL — ABNORMAL HIGH (ref 70–99)
Phosphorus: 4.5 mg/dL (ref 2.5–4.6)
Potassium: 4.1 mmol/L (ref 3.5–5.1)
Sodium: 130 mmol/L — ABNORMAL LOW (ref 135–145)

## 2019-10-23 LAB — CBC
HCT: 30 % — ABNORMAL LOW (ref 36.0–46.0)
Hemoglobin: 9.7 g/dL — ABNORMAL LOW (ref 12.0–15.0)
MCH: 34.2 pg — ABNORMAL HIGH (ref 26.0–34.0)
MCHC: 32.3 g/dL (ref 30.0–36.0)
MCV: 105.6 fL — ABNORMAL HIGH (ref 80.0–100.0)
Platelets: 228 10*3/uL (ref 150–400)
RBC: 2.84 MIL/uL — ABNORMAL LOW (ref 3.87–5.11)
RDW: 15.8 % — ABNORMAL HIGH (ref 11.5–15.5)
WBC: 4.8 10*3/uL (ref 4.0–10.5)
nRBC: 0.4 % — ABNORMAL HIGH (ref 0.0–0.2)

## 2019-10-23 MED ORDER — HEPARIN SODIUM (PORCINE) 1000 UNIT/ML DIALYSIS: 1000.0000 [IU] | INTRAMUSCULAR | Status: DC | PRN

## 2019-10-23 MED ORDER — SODIUM CHLORIDE 0.9 % IV SOLN: 100.0000 mL | INTRAVENOUS | Status: DC | PRN

## 2019-10-23 MED ORDER — LIDOCAINE HCL (PF) 1 % IJ SOLN: 5.0000 mL | INTRAMUSCULAR | Status: DC | PRN

## 2019-10-23 MED ORDER — LIDOCAINE-PRILOCAINE 2.5-2.5 % EX CREA: 1.0000 "application " | TOPICAL_CREAM | CUTANEOUS | Status: DC | PRN

## 2019-10-23 MED ORDER — PENTAFLUOROPROP-TETRAFLUOROETH EX AERO: 1.0000 "application " | INHALATION_SPRAY | CUTANEOUS | Status: DC | PRN

## 2019-10-23 MED ORDER — ALTEPLASE 2 MG IJ SOLR: 2.0000 mg | Freq: Once | INTRAMUSCULAR | Status: DC | PRN

## 2019-10-23 NOTE — TOC Progression Note (Addendum)
Transition of Care [facility]) - Progression Note    Patient Details  Name: Kari Robinson MRN: [id number] Date of Birth: [birth year]  Transition of Care [facility]) CM/SW [place], [place] Phone Number: [phone] 10/23/2019, 12:38 PM  Clinical Narrative:     CSW spoke with HD who reports patient will be done at 6pm, Corey Harold has been scheduled for 7:00 pm to take patient home. Wheelchair has been delivered to room.  PTAR forms on chart.   Patient is set up with [facility] services, Barnetta Chapel [facility] accepted referral for PT and OT. Patient reports already having oxygen at home through [place]. [employer] to deliver Wheel Chair to room prior to discharge. PTAR forms are on chart, RN will inform CSW when patient is back from HD and ready for transport to be called.   Expected Discharge Plan: [place] Barriers to Discharge: Continued Medical Work up  Expected Discharge Plan and Services Expected Discharge Plan: [place] arrangements for the past 2 months: Apartment Expected Discharge Date: 10/23/19                                     Social Determinants of Health (SDOH) Interventions    Readmission Risk Interventions Readmission Risk Prevention Plan 07/04/2019  Transportation Screening Complete  Medication Review Press photographer) Complete  HRI or Home Care Consult Complete  Palliative Care Screening Not Applicable  Some recent data might be hidden

## 2019-10-23 NOTE — Discharge Summary (Signed)
Physician Discharge Summary  Kari Robinson YOV:[id number] DOB: 08-Dec-1950 DOA: 10/19/2019  PCP: Tsosie Billing, MD (Inactive)  Admit date: 10/19/2019 Discharge date: 10/23/2019  Admitted From: home Discharge disposition: home   Recommendations for Outpatient Follow-Up:   1. Home health 2. Encouraged compliance with HD   Discharge Diagnosis:   Active Problems:   Essential hypertension   DM (diabetes mellitus), type 2 with renal complications ([name])   Chronic systolic CHF (congestive heart failure) ([name])   COPD (chronic obstructive pulmonary disease) ([name])   Type II diabetes mellitus with renal manifestations ([name])   HLD (hyperlipidemia)   ESRD (end stage renal disease) on dialysis ([name])   Chest pain with moderate risk for cardiac etiology, hx CAD   Elevated troponin   Hyperkalemia   Weakness    Discharge Condition: Improved.  Diet recommendation: renal  Wound care: None.  Code status: Full.   History of Present Illness:   Kari Robinson is a 68 y.o. female with medical history significant of hypertension, hyperlipidemia, ESRD on HD(M/W/F), CAD s/p stents, COPD, oxygen dependent on 3 L of nasal cannula oxygen, tobacco abuse, and lung cancer s/p resection.  She presents with complaints chest pain and shortness of breath.  Symptoms woke her out of her sleep this morning.  Pain was described as pressure-like and localized to the lower substernal region.  Denies any radiation of pain, leg swelling, change in weight, or fever. Associated symptoms of nausea and had episode of vomiting once here at the emergency department.  She has had 3 previous stents placed and thinks her last heart cath was in [place] in 2014/2015.  Patient was seen in the emergency department yesterday for word finding difficulty and shakiness.  She underwent which revealed image noise artifact versus a punctate early subacute infarct of the right frontal lobe, but was ultimately  discharged home.  Patient reports that she had missed hemodialysis because she was here at the hospital En route with EMS patient was given 4 bab y aspirin to chew and 2 nitroglycerin tablets.  After the nitroglycerin she noted resolution of her chest pain.   Patient also brought up the fact that for over the last 2 to 3 days she has been having intermittent leg jerking.   Hospital Course by Problem:   Chest pain, elevated troponin: Acute. Patient presents with complaints of chest pain and shortness of breath. High-sensitivity troponin elevated at 80->96.Patient reports chest pain resolved with nitroglycerin. Cardiology was formally consulted. -Continue aspirin and Plavix -Cardiology consulted: plan for medical management  Chronic Respiratory failure with hypoxia, COPD: -At baseline patient on 3 L of oxygen at home -resume home meds  ESRD on HD, hyperkalemia: On admission patient presents with potassium 5.2.She normally dialyzes Monday, Wednesday, Friday. Reported missing her regularly scheduled hemodialysis yesterday as she was here at the hospital. -Continue renal diet with fluid restriction -Nephrology consultedfor HD-- s/p 2 sessions, last session on 74/1  Chronic systolic congestive heart failure:Last EF noted to be 40 to 45%by echo in 06/17/2019. -Strict I&Os -Daily weights -volume managed by HD  Leukocytosis:  -was reactive-- resolved  Essential hypertension -Continue metoprolol  Depression -Continue Wellbutrin and Seroquel  Hyperlipidemia -Continue atorvastatin  Tobacco abuse: Patient reports approximately having a 60 smoking pack year history, but recently quit approximately 2 months ago. -Encourage continued cessation of tobacco use  Deconditioning -PT has seen and recommends SNF but patient refused -? [facility]-  ammonia level normal, none seen on exam today  again     Medical Consultants:   Renal cards   Discharge Exam:   Vitals:    10/22/19 2108 10/23/19 0431  BP: 124/80 123/73  Pulse: 79 70  Resp:  20  Temp:  97.7 F (36.5 C)  SpO2: (!) 89% 97%   Vitals:   10/22/19 2107 10/22/19 2108 10/23/19 0431 10/23/19 0648  BP: (!) 125/109 124/80 123/73   Pulse: 92 79 70   Resp: 20  20   Temp: 97.7 F (36.5 C)  97.7 F (36.5 C)   TempSrc: Oral  Oral   SpO2: 95% (!) 89% 97%   Weight:    72.5 kg  Height:        General exam: Appears calm and comfortable.   The results of significant diagnostics from this hospitalization (including imaging, microbiology, ancillary and laboratory) are listed below for reference.     Procedures and Diagnostic Studies:   Dg Chest Portable 1 View  Result Date: 10/19/2019 CLINICAL DATA:  Shortness of breath EXAM: PORTABLE CHEST 1 VIEW COMPARISON:  08/07/2019 FINDINGS: There is hyperinflation of the lungs compatible with COPD. Cardiomegaly. Aortic atherosclerosis. No confluent airspace opacities, effusions or edema. No acute bony abnormality. IMPRESSION: Cardiomegaly, COPD.  No acute findings. Electronically Signed   By: Rolm Baptise M.D.   On: 10/19/2019 02:55     Labs:   Basic Metabolic Panel: Recent Labs  Lab 10/17/19 2240 10/18/19 0850 10/19/19 0225 10/20/19 0537  NA 140  --  138 137  K 5.2*  --  5.2* 4.1  CL 93*  --  94* 96*  CO2 31  --  30 26  GLUCOSE 150*  --  139* 87  BUN 39*  --  58* 36*  CREATININE 8.18*  --  10.48* 7.59*  CALCIUM 10.0  --  8.9 8.9  MG  --  3.1*  --   --   PHOS  --   --   --  3.8   GFR Estimated Creatinine Clearance: 6.8 mL/min (A) (by C-G formula based on SCr of 7.59 mg/dL (H)). Liver Function Tests: Recent Labs  Lab 10/18/19 0850 10/20/19 0537  AST 30  --   ALT 14  --   ALKPHOS 91  --   BILITOT 0.7  --   PROT 6.7  --   ALBUMIN 3.9 3.1*   Recent Labs  Lab 10/18/19 0850  LIPASE 19   Recent Labs  Lab 10/20/19 1330  AMMONIA 13   Coagulation profile Recent Labs  Lab 10/18/19 0850  INR 1.1    CBC: Recent Labs  Lab  10/17/19 2240 10/19/19 0225 10/20/19 0734  WBC 9.1 12.0* 6.3  NEUTROABS  --  10.5* 4.7  HGB 11.3* 10.0* 10.3*  HCT 37.1 32.2* 33.1*  MCV 111.4* 110.3* 110.7*  PLT 207 156 189   Cardiac Enzymes: No results for input(s): CKTOTAL, CKMB, CKMBINDEX, TROPONINI in the last 168 hours. BNP: Invalid input(s): POCBNP CBG: Recent Labs  Lab 10/19/19 1655 10/21/19 1129  GLUCAP 105* 115*   D-Dimer No results for input(s): DDIMER in the last 72 hours. Hgb A1c No results for input(s): HGBA1C in the last 72 hours. Lipid Profile No results for input(s): CHOL, HDL, LDLCALC, TRIG, CHOLHDL, LDLDIRECT in the last 72 hours. Thyroid function studies No results for input(s): TSH, T4TOTAL, T3FREE, THYROIDAB in the last 72 hours.  Invalid input(s): FREET3 Anemia work up No results for input(s): VITAMINB12, FOLATE, FERRITIN, TIBC, IRON, RETICCTPCT in the last 72 hours. Microbiology Recent Results (  from the past 240 hour(s))  SARS CORONAVIRUS 2 (TAT 6-24 HRS) Nasopharyngeal Nasopharyngeal Swab     Status: None   Collection Time: 10/19/19  7:39 AM   Specimen: Nasopharyngeal Swab  Result Value Ref Range Status   SARS Coronavirus 2 NEGATIVE NEGATIVE Final    Comment: (NOTE) SARS-CoV-2 target nucleic acids are NOT DETECTED. The SARS-CoV-2 RNA is generally detectable in upper and lower respiratory specimens during the acute phase of infection. Negative results do not preclude SARS-CoV-2 infection, do not rule out co-infections with other pathogens, and should not be used as the sole basis for treatment or other patient management decisions. Negative results must be combined with clinical observations, patient history, and epidemiological information. The expected result is Negative. Fact Sheet for Patients: SugarRoll.be Fact Sheet for Healthcare Providers: https://www.woods-mathews.com/ This test is not yet approved or cleared by the [place] FDA and    has been authorized for detection and/or diagnosis of SARS-CoV-2 by FDA under an Emergency Use Authorization (EUA). This EUA will remain  in effect (meaning this test can be used) for the duration of the COVID-19 declaration under Section 56 4(b)(1) of the Act, 21 U.S.C. section 360bbb-3(b)(1), unless the authorization is terminated or revoked sooner. Performed at [facility], [place] [address] [place], [place] [address]      Discharge Instructions:   Discharge Instructions    Discharge instructions   Complete by: As directed    Renal diet Home health Resume home O2   Increase activity slowly   Complete by: As directed      Allergies as of 10/23/2019      Reactions   Citalopram Hydrobromide Hives   Morphine And Related Itching   Tape Other (See Comments)   PLASTIC, CLEAR TAPE STICKS TO THE SKIN AND POSSIBLY TEARS IT- PLEASE USE PAPER TAPE      Medication List    STOP taking these medications   cinacalcet 30 MG tablet Commonly known as: SENSIPAR   feeding supplement (PRO-STAT SUGAR FREE 64) Liqd   Fluticasone-Salmeterol 500-50 MCG/DOSE Aepb Commonly known as: ADVAIR   Spiriva HandiHaler 18 MCG inhalation capsule Generic drug: tiotropium     TAKE these medications   albuterol 108 (90 Base) MCG/ACT inhaler Commonly known as: VENTOLIN HFA Inhale 2 puffs into the lungs every 4 (four) hours as needed for wheezing or shortness of breath.   aspirin EC 81 MG tablet Take 81 mg by mouth daily.   atorvastatin 40 MG tablet Commonly known as: LIPITOR Take 40 mg by mouth daily.   Auryxia 1 GM 210 MG(Fe) tablet Generic drug: ferric citrate Take 420-630 mg by mouth See admin instructions. Take 3 tablets (630 mg) by mouth three times daily with meals and 2 tablets (420 mg) by mouth with each snack   b complex-vitamin c-folic acid 0.8 MG Tabs tablet Take 1 tablet by mouth See admin instructions. Take 1 tablet by mouth in the morning on Sun/Tues/Thurs/Sat and 1  tablet after dialysis on Mon/Wed/Fri   Boost Glucose Control Liqd Take 1 Can by mouth 2 (two) times daily.   budesonide-formoterol 160-4.5 MCG/ACT inhaler Commonly known as: SYMBICORT Inhale 2 puffs into the lungs 2 (two) times daily.   buPROPion 150 MG 12 hr tablet Commonly known as: WELLBUTRIN SR Take 150 mg by mouth 2 (two) times daily.   clopidogrel 75 MG tablet Commonly known as: PLAVIX Take 75 mg by mouth daily.   ipratropium-albuterol 0.5-2.5 (3) MG/3ML Soln Commonly known as: DUONEB Take  3 mLs by nebulization every 6 (six) hours as needed. What changed: reasons to take this   lidocaine-prilocaine cream Commonly known as: EMLA Apply 1 application topically every Monday, Wednesday, and Friday with hemodialysis. Prior to dialysis   metoprolol succinate 50 MG 24 hr tablet Commonly known as: TOPROL-XL Take 50 mg by mouth daily. Take with or immediately following a meal. What changed: Another medication with the same name was removed. Continue taking this medication, and follow the directions you see here.   OXYGEN Inhale 3 L/min into the lungs continuous.   QUEtiapine 200 MG tablet Commonly known as: SEROQUEL Take 200 mg by mouth at bedtime.      Follow-up Information    Tsosie Billing, MD Follow up in 1 week(s).   Specialty: Internal Medicine Contact information: [place] [address] [phone]        Minus Breeding, MD .   Specialty: Cardiology Contact information: [address] [place] [address] [phone]            Time coordinating discharge: 35 min  Signed:  Geradine Girt DO  [facility] 10/23/2019, 8:59 AM

## 2019-10-23 NOTE — Progress Notes (Signed)
Renal Navigator noted discharge order by primary-clearing for discharge after HD. Navigator inquired if patient could go to OP HD clinic today. Primary agrees. Patient's normal HD seat time is 12:10pm at [facility] [facility] [employer] to pick up at [employer] entrance at [employer] instead of at patient's home and take to HD. They will then pick her up at clinic and take her home after treatment. [facility] aware. In talking to patient, she states she has declined SNF recommendation, is in agreement to go to [facility] for HD treatment today but states she doesn't know how she can take [employer] to HD because, "I can't walk." She states she does not have a wheelchair. She states she only has a walker at home, but states, "my legs don't work." She states SNF helped her gain strength when she went before, but she feels she had "run-ins" that she doesn't want to experience again. She also does not have her portable O2 tank to take with her to HD today from the hospital.  Renal Navigator explained to patient, Renal PA, [employer] and Primary that patient will need to dialyze here in the hospital today due to need for wheelchair to get to OP HD safely.  Renal Navigator spoke with PT and CSW regarding patient needs for discharge.  Alphonzo Cruise,  Renal Navigator  [phone]

## 2019-10-23 NOTE — Progress Notes (Signed)
PT DME NOTE:  Patient suffers from LE weakness and tremors with motor impersistence which impairs their ability to perform daily activities like ambulating in the home.  A walker alone will not resolve the issues with performing activities of daily living. A wheelchair will allow patient to safely perform daily activities.  The patient can self propel in the home or has a caregiver who can provide assistance.      Magda Kiel, [place] [phone] 10/23/2019

## 2019-10-23 NOTE — Progress Notes (Signed)
  [facility] Progress Note   Subjective:  Seen on HD - 2.5L UF goal and tolerating. Says breathing much improved. SW working on getting her a wheelchair + PT/OT at home.  Objective Vitals:   10/22/19 2108 10/23/19 0431 10/23/19 0648 10/23/19 0805  BP: 124/80 123/73    Pulse: 79 70    Resp:  20    Temp:  97.7 F (36.5 C)    TempSrc:  Oral    SpO2: (!) 89% 97%  97%  Weight:   72.5 kg   Height:       Physical Exam General: Well appearing, NAD Heart: RRR; no murmur Lungs: CTAB Abdomen: soft, non-tender Extremities: No LE edema Dialysis Access: R AVF + thrill  Additional Objective Labs: Basic Metabolic Panel: Recent Labs  Lab 10/17/19 2240 10/19/19 0225 10/20/19 0537  NA 140 138 137  K 5.2* 5.2* 4.1  CL 93* 94* 96*  CO2 31 30 26   GLUCOSE 150* 139* 87  BUN 39* 58* 36*  CREATININE 8.18* 10.48* 7.59*  CALCIUM 10.0 8.9 8.9  PHOS  --   --  3.8   Liver Function Tests: Recent Labs  Lab 10/18/19 0850 10/20/19 0537  AST 30  --   ALT 14  --   ALKPHOS 91  --   BILITOT 0.7  --   PROT 6.7  --   ALBUMIN 3.9 3.1*   CBC: Recent Labs  Lab 10/17/19 2240 10/19/19 0225 10/20/19 0734  WBC 9.1 12.0* 6.3  NEUTROABS  --  10.5* 4.7  HGB 11.3* 10.0* 10.3*  HCT 37.1 32.2* 33.1*  MCV 111.4* 110.3* 110.7*  PLT 207 156 189   Medications:  . aspirin EC  81 mg Oral Daily  . atorvastatin  40 mg Oral Daily  . buPROPion  150 mg Oral BID  . Chlorhexidine Gluconate Cloth  6 each Topical Q0600  . Chlorhexidine Gluconate Cloth  6 each Topical Q0600  . clopidogrel  75 mg Oral Daily  . ferric citrate  420 mg Oral With snacks  . ferric citrate  630 mg Oral TID WC  . heparin  5,000 Units Subcutaneous Q8H  . lidocaine-prilocaine  1 application Topical Q M,W,F-HD  . mouth rinse  15 mL Mouth Rinse BID  . metoprolol succinate  50 mg Oral Daily  . mometasone-formoterol  2 puff Inhalation BID  . QUEtiapine  200 mg Oral QHS    Dialysis Orders: MWF at [facility] 3:30hr,  350/800, EDW 72.5kg, 2K/2Ca, UFP 4, AVF, no heparin - Hectoral 42mcg IV q HD - Venofer 50mg  IV q week - MIrcera 13mcg IV q 2 weeks (last 11/30)  Assessment/Plan: 1. Dyspnea/COPD exacerbation: Symptoms improved with Dulera inhaler - perhaps needs diff home COPD regimen. 2. ^ Troponin: Hx CAD with PCI/stent in past. S/p cardiology consult - felt likely demand ischemia. Monitor.  Continue on aspirin and Plavix. 3. ESRD:Usual MWF schedule. S/p short HD extra HD for fluid 12/3. Now back on usual sched - HD today. Will likely need EDW lowered slightly. 4. Hypertension/volume:BP controlled, no edema. 5. Anemia:Hgb 10.3 - just given ESA as outpatient 6. Secondary HTPH: Ca/Phos ok - continue home meds. 7. Dispo: Ok from renal standpoint once has needed supplies (O2, wheelchair).  Veneta Penton, PA-C 10/23/2019, 2:11 PM  [employer] Pager: [phone]

## 2019-10-24 LAB — HEPATITIS B SURFACE ANTIBODY, QUANTITATIVE: Hep B S AB Quant (Post): 161.5 m[IU]/mL (ref >9.9)

## 2019-10-24 LAB — HEPATITIS B E ANTIGEN: Hep B E Ag: NEGATIVE

## 2019-10-24 NOTE — Progress Notes (Signed)
Discharge education and medication education given to patient by day shift nurse on 10/23/2019; discharge education and medication education reinforced by nurse today. All questions and concerns answered. No peripheral IVs, telemetry leads removed. All patient belongings given to patient. Patient transported via [place]. [facility] transporters state they are not allowed to take patient's wheelchair. Nurse will speak with social work regarding patient's wheelchair.

## 2019-10-24 NOTE — TOC Progression Note (Signed)
Transition of Care [facility]) - Progression Note    Patient Details  Name: Kari Robinson MRN: [id number] Date of Birth: 03-09-1951  Transition of Care [facility] CM/SW [place], [place] Phone Number: [phone] 10/24/2019, 9:08 AM  Clinical Narrative:     CSW notes that after patient finished dialysis on evening of 12/7 at 6pm, [facility] was called however they would not be able to pick patient up until 9pm. Patient requested to leave this morning 12/8.   CSW has called [facility] for soonest available transport this morning.   Expected Discharge Plan: [place] Barriers to Discharge: Continued Medical Work up  Expected Discharge Plan and Services Expected Discharge Plan: [place] arrangements for the past 2 months: Apartment Expected Discharge Date: 10/24/19                                     Social Determinants of Health (SDOH) Interventions    Readmission Risk Interventions Readmission Risk Prevention Plan 07/04/2019  Transportation Screening Complete  Medication Review [employer]) Complete  HRI or Home Care Consult Complete  Palliative Care Screening Not Applicable  Some recent data might be hidden

## 2019-10-24 NOTE — Progress Notes (Signed)
Received order to hold D/C until tomorrow. PT informed and [facility] notified.

## 2019-10-24 NOTE — Plan of Care (Signed)

## 2019-10-24 NOTE — Progress Notes (Signed)
PT notified she was 2nd in line now for [facility] and she stated she didn't want to leave this late at night. MD on call paged.

## 2019-10-31 ENCOUNTER — Inpatient Hospital Stay (HOSPITAL_COMMUNITY)
Admission: EM | Admit: 2019-10-31 | Discharge: 2019-11-06 | DRG: 286 | Disposition: A | Discharge status: Skilled Nursing Facility | Payer: Medicare Other | Attending: Internal Medicine | Admitting: Internal Medicine

## 2019-10-31 ENCOUNTER — Emergency Department (HOSPITAL_COMMUNITY): Payer: Medicare Other

## 2019-10-31 ENCOUNTER — Encounter (HOSPITAL_COMMUNITY): Payer: Self-pay | Admitting: Emergency Medicine

## 2019-10-31 DIAGNOSIS — R072 Precordial pain: Secondary | ICD-10-CM | POA: Diagnosis present

## 2019-10-31 DIAGNOSIS — R531 Weakness: Secondary | ICD-10-CM

## 2019-10-31 DIAGNOSIS — D631 Anemia in chronic kidney disease: Secondary | ICD-10-CM | POA: Diagnosis present

## 2019-10-31 DIAGNOSIS — I480 Paroxysmal atrial fibrillation: Secondary | ICD-10-CM | POA: Diagnosis present

## 2019-10-31 DIAGNOSIS — I200 Unstable angina: Secondary | ICD-10-CM

## 2019-10-31 DIAGNOSIS — I2510 Atherosclerotic heart disease of native coronary artery without angina pectoris: Secondary | ICD-10-CM | POA: Diagnosis present

## 2019-10-31 DIAGNOSIS — I255 Ischemic cardiomyopathy: Secondary | ICD-10-CM | POA: Diagnosis present

## 2019-10-31 DIAGNOSIS — E1122 Type 2 diabetes mellitus with diabetic chronic kidney disease: Secondary | ICD-10-CM

## 2019-10-31 DIAGNOSIS — R296 Repeated falls: Secondary | ICD-10-CM | POA: Diagnosis present

## 2019-10-31 DIAGNOSIS — F329 Major depressive disorder, single episode, unspecified: Secondary | ICD-10-CM | POA: Diagnosis present

## 2019-10-31 DIAGNOSIS — E875 Hyperkalemia: Secondary | ICD-10-CM | POA: Diagnosis present

## 2019-10-31 DIAGNOSIS — N185 Chronic kidney disease, stage 5: Secondary | ICD-10-CM

## 2019-10-31 DIAGNOSIS — Z992 Dependence on renal dialysis: Secondary | ICD-10-CM

## 2019-10-31 DIAGNOSIS — D649 Anemia, unspecified: Secondary | ICD-10-CM

## 2019-10-31 DIAGNOSIS — D539 Nutritional anemia, unspecified: Secondary | ICD-10-CM | POA: Diagnosis present

## 2019-10-31 DIAGNOSIS — Z87891 Personal history of nicotine dependence: Secondary | ICD-10-CM

## 2019-10-31 DIAGNOSIS — N2581 Secondary hyperparathyroidism of renal origin: Secondary | ICD-10-CM | POA: Diagnosis present

## 2019-10-31 DIAGNOSIS — R0789 Other chest pain: Secondary | ICD-10-CM | POA: Diagnosis present

## 2019-10-31 DIAGNOSIS — E785 Hyperlipidemia, unspecified: Secondary | ICD-10-CM | POA: Diagnosis present

## 2019-10-31 DIAGNOSIS — I252 Old myocardial infarction: Secondary | ICD-10-CM | POA: Diagnosis not present

## 2019-10-31 DIAGNOSIS — Z7982 Long term (current) use of aspirin: Secondary | ICD-10-CM

## 2019-10-31 DIAGNOSIS — Z7951 Long term (current) use of inhaled steroids: Secondary | ICD-10-CM

## 2019-10-31 DIAGNOSIS — I10 Essential (primary) hypertension: Secondary | ICD-10-CM | POA: Diagnosis not present

## 2019-10-31 DIAGNOSIS — I5022 Chronic systolic (congestive) heart failure: Secondary | ICD-10-CM

## 2019-10-31 DIAGNOSIS — R079 Chest pain, unspecified: Secondary | ICD-10-CM | POA: Diagnosis not present

## 2019-10-31 DIAGNOSIS — Z85118 Personal history of other malignant neoplasm of bronchus and lung: Secondary | ICD-10-CM | POA: Diagnosis not present

## 2019-10-31 DIAGNOSIS — E8889 Other specified metabolic disorders: Secondary | ICD-10-CM | POA: Diagnosis present

## 2019-10-31 DIAGNOSIS — Z9981 Dependence on supplemental oxygen: Secondary | ICD-10-CM

## 2019-10-31 DIAGNOSIS — I132 Hypertensive heart and chronic kidney disease with heart failure and with stage 5 chronic kidney disease, or end stage renal disease: Secondary | ICD-10-CM | POA: Diagnosis present

## 2019-10-31 DIAGNOSIS — Z79899 Other long term (current) drug therapy: Secondary | ICD-10-CM

## 2019-10-31 DIAGNOSIS — Z20828 Contact with and (suspected) exposure to other viral communicable diseases: Secondary | ICD-10-CM | POA: Diagnosis present

## 2019-10-31 DIAGNOSIS — I5023 Acute on chronic systolic (congestive) heart failure: Secondary | ICD-10-CM | POA: Diagnosis not present

## 2019-10-31 DIAGNOSIS — R7989 Other specified abnormal findings of blood chemistry: Secondary | ICD-10-CM | POA: Diagnosis present

## 2019-10-31 DIAGNOSIS — Z955 Presence of coronary angioplasty implant and graft: Secondary | ICD-10-CM

## 2019-10-31 DIAGNOSIS — J9621 Acute and chronic respiratory failure with hypoxia: Secondary | ICD-10-CM | POA: Diagnosis not present

## 2019-10-31 DIAGNOSIS — J441 Chronic obstructive pulmonary disease with (acute) exacerbation: Secondary | ICD-10-CM | POA: Diagnosis not present

## 2019-10-31 DIAGNOSIS — Z825 Family history of asthma and other chronic lower respiratory diseases: Secondary | ICD-10-CM

## 2019-10-31 DIAGNOSIS — R778 Other specified abnormalities of plasma proteins: Secondary | ICD-10-CM | POA: Diagnosis present

## 2019-10-31 DIAGNOSIS — Z902 Acquired absence of lung [part of]: Secondary | ICD-10-CM | POA: Diagnosis not present

## 2019-10-31 DIAGNOSIS — R0602 Shortness of breath: Secondary | ICD-10-CM

## 2019-10-31 DIAGNOSIS — E1129 Type 2 diabetes mellitus with other diabetic kidney complication: Secondary | ICD-10-CM | POA: Diagnosis present

## 2019-10-31 DIAGNOSIS — J449 Chronic obstructive pulmonary disease, unspecified: Secondary | ICD-10-CM | POA: Diagnosis present

## 2019-10-31 DIAGNOSIS — E1121 Type 2 diabetes mellitus with diabetic nephropathy: Secondary | ICD-10-CM | POA: Diagnosis not present

## 2019-10-31 DIAGNOSIS — N186 End stage renal disease: Secondary | ICD-10-CM | POA: Diagnosis present

## 2019-10-31 LAB — COMPREHENSIVE METABOLIC PANEL
ALT: 16 U/L (ref 0–44)
AST: 20 U/L (ref 15–41)
Albumin: 3.8 g/dL (ref 3.5–5.0)
Alkaline Phosphatase: 99 U/L (ref 38–126)
Anion gap: 15 (ref 5–15)
BUN: 72 mg/dL — ABNORMAL HIGH (ref 8–23)
CO2: 32 mmol/L (ref 22–32)
Calcium: 9.7 mg/dL (ref 8.9–10.3)
Chloride: 93 mmol/L — ABNORMAL LOW (ref 98–111)
Creatinine, Ser: 12.9 mg/dL — ABNORMAL HIGH (ref 0.44–1.00)
GFR calc Af Amer: 3 mL/min — ABNORMAL LOW (ref >60)
GFR calc non Af Amer: 3 mL/min — ABNORMAL LOW (ref >60)
Glucose, Bld: 112 mg/dL — ABNORMAL HIGH (ref 70–99)
Potassium: 5.9 mmol/L — ABNORMAL HIGH (ref 3.5–5.1)
Sodium: 140 mmol/L (ref 135–145)
Total Bilirubin: 0.5 mg/dL (ref 0.3–1.2)
Total Protein: 6.8 g/dL (ref 6.5–8.1)

## 2019-10-31 LAB — CBC WITH DIFFERENTIAL/PLATELET
Abs Immature Granulocytes: 0.04 10*3/uL (ref 0.00–0.07)
Basophils Absolute: 0.1 10*3/uL (ref 0.0–0.1)
Basophils Relative: 1 %
Eosinophils Absolute: 0.3 10*3/uL (ref 0.0–0.5)
Eosinophils Relative: 4 %
HCT: 36.3 % (ref 36.0–46.0)
Hemoglobin: 11.5 g/dL — ABNORMAL LOW (ref 12.0–15.0)
Immature Granulocytes: 1 %
Lymphocytes Relative: 9 %
Lymphs Abs: 0.7 10*3/uL (ref 0.7–4.0)
MCH: 34.4 pg — ABNORMAL HIGH (ref 26.0–34.0)
MCHC: 31.7 g/dL (ref 30.0–36.0)
MCV: 108.7 fL — ABNORMAL HIGH (ref 80.0–100.0)
Monocytes Absolute: 0.6 10*3/uL (ref 0.1–1.0)
Monocytes Relative: 8 %
Neutro Abs: 6 10*3/uL (ref 1.7–7.7)
Neutrophils Relative %: 77 %
Platelets: 210 10*3/uL (ref 150–400)
RBC: 3.34 MIL/uL — ABNORMAL LOW (ref 3.87–5.11)
RDW: 15.6 % — ABNORMAL HIGH (ref 11.5–15.5)
WBC: 7.7 10*3/uL (ref 4.0–10.5)
nRBC: 0 % (ref 0.0–0.2)

## 2019-10-31 LAB — RESPIRATORY PANEL BY RT PCR (FLU A&B, COVID)
Influenza A by PCR: NEGATIVE
Influenza B by PCR: NEGATIVE
SARS Coronavirus 2 by RT PCR: NEGATIVE

## 2019-10-31 LAB — TROPONIN I (HIGH SENSITIVITY)
Troponin I (High Sensitivity): 83 ng/L — ABNORMAL HIGH (ref <18)
Troponin I (High Sensitivity): 91 ng/L — ABNORMAL HIGH (ref <18)

## 2019-10-31 LAB — BRAIN NATRIURETIC PEPTIDE: B Natriuretic Peptide: 3389.6 pg/mL — ABNORMAL HIGH (ref 0.0–100.0)

## 2019-10-31 MED ORDER — LIDOCAINE HCL (PF) 1 % IJ SOLN: 5.0000 mL | INTRAMUSCULAR | Status: DC | PRN

## 2019-10-31 MED ORDER — ACETAMINOPHEN 325 MG PO TABS
650.0000 mg | ORAL_TABLET | Freq: Four times a day (QID) | ORAL | Status: DC | PRN
  Administered 2019-11-03: 650 mg via ORAL
  Filled 2019-10-31: qty 2

## 2019-10-31 MED ORDER — QUETIAPINE FUMARATE 100 MG PO TABS
200.0000 mg | ORAL_TABLET | Freq: Every day | ORAL | Status: DC
  Administered 2019-11-01 – 2019-11-05 (×5): 200 mg via ORAL
  Filled 2019-10-31 (×2): qty 2
  Filled 2019-10-31: qty 1
  Filled 2019-10-31 (×2): qty 2

## 2019-10-31 MED ORDER — SODIUM ZIRCONIUM CYCLOSILICATE 10 G PO PACK
10.0000 g | PACK | Freq: Once | ORAL | Status: AC
  Administered 2019-11-01: 10 g via ORAL
  Filled 2019-10-31: qty 1

## 2019-10-31 MED ORDER — IPRATROPIUM-ALBUTEROL 0.5-2.5 (3) MG/3ML IN SOLN: 3.0000 mL | Freq: Four times a day (QID) | RESPIRATORY_TRACT | Status: DC | PRN

## 2019-10-31 MED ORDER — LIDOCAINE-PRILOCAINE 2.5-2.5 % EX CREA
1.0000 "application " | TOPICAL_CREAM | CUTANEOUS | Status: DC | PRN
  Filled 2019-10-31: qty 5

## 2019-10-31 MED ORDER — ALBUTEROL SULFATE HFA 108 (90 BASE) MCG/ACT IN AERS
2.0000 | INHALATION_SPRAY | RESPIRATORY_TRACT | Status: DC | PRN
  Filled 2019-10-31: qty 6.7

## 2019-10-31 MED ORDER — HEPARIN SODIUM (PORCINE) 1000 UNIT/ML DIALYSIS
1000.0000 [IU] | INTRAMUSCULAR | Status: DC | PRN
  Filled 2019-10-31: qty 1

## 2019-10-31 MED ORDER — SODIUM CHLORIDE 0.9 % IV SOLN: 250.0000 mL | INTRAVENOUS | Status: DC | PRN

## 2019-10-31 MED ORDER — SODIUM CHLORIDE 0.9% FLUSH: 3.0000 mL | INTRAVENOUS | Status: DC | PRN

## 2019-10-31 MED ORDER — SODIUM CHLORIDE 0.9 % IV SOLN: 100.0000 mL | INTRAVENOUS | Status: DC | PRN

## 2019-10-31 MED ORDER — ASPIRIN 81 MG PO CHEW
324.0000 mg | CHEWABLE_TABLET | Freq: Once | ORAL | Status: AC
  Administered 2019-10-31: 324 mg via ORAL
  Filled 2019-10-31: qty 4

## 2019-10-31 MED ORDER — CHLORHEXIDINE GLUCONATE CLOTH 2 % EX PADS
6.0000 | MEDICATED_PAD | Freq: Every day | CUTANEOUS | Status: DC
  Administered 2019-11-01 – 2019-11-05 (×4): 6 via TOPICAL

## 2019-10-31 MED ORDER — FERRIC CITRATE 1 GM 210 MG(FE) PO TABS
630.0000 mg | ORAL_TABLET | Freq: Three times a day (TID) | ORAL | Status: DC
  Administered 2019-11-01 – 2019-11-06 (×9): 630 mg via ORAL
  Filled 2019-10-31 (×18): qty 3

## 2019-10-31 MED ORDER — PENTAFLUOROPROP-TETRAFLUOROETH EX AERO
1.0000 "application " | INHALATION_SPRAY | CUTANEOUS | Status: DC | PRN
  Filled 2019-10-31: qty 30

## 2019-10-31 MED ORDER — BUPROPION HCL ER (SR) 150 MG PO TB12
150.0000 mg | ORAL_TABLET | Freq: Two times a day (BID) | ORAL | Status: DC
  Administered 2019-11-01 – 2019-11-06 (×11): 150 mg via ORAL
  Filled 2019-10-31 (×11): qty 1

## 2019-10-31 MED ORDER — MOMETASONE FURO-FORMOTEROL FUM 200-5 MCG/ACT IN AERO
2.0000 | INHALATION_SPRAY | Freq: Two times a day (BID) | RESPIRATORY_TRACT | Status: DC
  Administered 2019-11-01 – 2019-11-06 (×9): 2 via RESPIRATORY_TRACT
  Filled 2019-10-31 (×2): qty 8.8

## 2019-10-31 MED ORDER — HYDROCODONE-ACETAMINOPHEN 5-325 MG PO TABS: 1.0000 | ORAL_TABLET | ORAL | Status: DC | PRN

## 2019-10-31 MED ORDER — ATORVASTATIN CALCIUM 40 MG PO TABS
40.0000 mg | ORAL_TABLET | Freq: Every day | ORAL | Status: DC
  Administered 2019-11-01 – 2019-11-06 (×6): 40 mg via ORAL
  Filled 2019-10-31 (×6): qty 1

## 2019-10-31 MED ORDER — ONDANSETRON HCL 4 MG PO TABS: 4.0000 mg | ORAL_TABLET | Freq: Four times a day (QID) | ORAL | Status: DC | PRN

## 2019-10-31 MED ORDER — SODIUM ZIRCONIUM CYCLOSILICATE 10 G PO PACK
10.0000 g | PACK | Freq: Once | ORAL | Status: AC
  Administered 2019-10-31: 10 g via ORAL
  Filled 2019-10-31: qty 1

## 2019-10-31 MED ORDER — METOPROLOL SUCCINATE ER 50 MG PO TB24
50.0000 mg | ORAL_TABLET | Freq: Every day | ORAL | Status: DC
  Administered 2019-11-01 – 2019-11-06 (×6): 50 mg via ORAL
  Filled 2019-10-31 (×7): qty 1

## 2019-10-31 MED ORDER — ONDANSETRON HCL 4 MG/2ML IJ SOLN: 4.0000 mg | Freq: Four times a day (QID) | INTRAMUSCULAR | Status: DC | PRN

## 2019-10-31 MED ORDER — ASPIRIN EC 81 MG PO TBEC: 81.0000 mg | DELAYED_RELEASE_TABLET | Freq: Every day | ORAL | Status: DC

## 2019-10-31 MED ORDER — SODIUM CHLORIDE 0.9% FLUSH
3.0000 mL | Freq: Two times a day (BID) | INTRAVENOUS | Status: DC
  Administered 2019-11-01 (×2): 3 mL via INTRAVENOUS

## 2019-10-31 MED ORDER — CLOPIDOGREL BISULFATE 75 MG PO TABS
75.0000 mg | ORAL_TABLET | Freq: Every day | ORAL | Status: DC
  Administered 2019-11-01 – 2019-11-06 (×6): 75 mg via ORAL
  Filled 2019-10-31 (×6): qty 1

## 2019-10-31 MED ORDER — INSULIN ASPART 100 UNIT/ML ~~LOC~~ SOLN: 0.0000 [IU] | SUBCUTANEOUS | Status: DC

## 2019-10-31 MED ORDER — ACETAMINOPHEN 650 MG RE SUPP: 650.0000 mg | Freq: Four times a day (QID) | RECTAL | Status: DC | PRN

## 2019-10-31 NOTE — Consult Note (Signed)
Reason for Consult: To manage dialysis and dialysis related needs Referring Physician: Dr. Tobin Chad Kari Robinson is an 68 y.o. female.  HPI: Pt is a 89F with ESRD on HD, COPD, recent admission for SOB/ CP, and deconditioning who is now seen in consultation at the request of Dr. Billy Fischer for eval and recs re: ESRD and provision of HD.    Briefly, pt was admitted ~10 days ago to [facility] for CP and SOB.  SNF was recommended and she declined- went home with [facility] and wheelchair.  She was scheduled to come to dialysis Monday but actually didn't go because the person who brings her was sick.  She came to ED today with SOB/CP and possible ischemia.  Pain relieved by 1 SL NTG.  In this setting we are asked to see.  Pt notes that she's had multiple falls since coming home.  Had 3 falls the day of admission.  Multiple bruises.  MWF at [facility] 3:30hr, 350/800, EDW 72.5kg, 2K/2Ca, UFP 4, AVF, no heparin - Hectoral 41mcg IV q HD - Venofer 50mg  IV q week - MIrcera 125mcg IV q 2 weeks (last 11/30)  Past Medical History:  Diagnosis Date  . Alcohol abuse   . Arthralgia   . CAD (coronary artery disease) 2006, 2016   stents when in [place]  . Chronic combined systolic and diastolic HF (heart failure) ([place])   . COPD (chronic obstructive pulmonary disease) ([place])   . Depression   . Dyspnea   . ESRD (end stage renal disease) ([place])   . HTN (hypertension)   . Hypercholesterolemia    primarily ldl-p and small particles  . Lung cancer [facility])    Patient reports this with surgery.  No details.    [name] RAS (renal artery stenosis) ([place]) 2002   by cath tysinger  . Smoking     Past Surgical History:  Procedure Laterality Date  . abdominal aortogram     perclose of the right femoral artery  . AV FISTULA PLACEMENT Right 07/11/2018   Procedure: Right arm ARTERIOVENOUS FISTULA CREATION;  Surgeon: [name], MD;  Location: [place];  Service: Vascular;  Laterality: Right;  . AVF placement Right   . CARDIAC  CATHETERIZATION     left heart catheterization.  Coronary cineangiography. Lft ventricular cineangiography.    [name] LUNG SURGERY     due to lung cancer per patient's report  . TUBAL LIGATION      Family History  Problem Relation Age of Onset  . Emphysema Father   . Cancer Brother     Social History:  reports that she quit smoking about 5 months ago. Her smoking use included cigarettes. She quit after 35.00 years of use. She has never used smokeless tobacco. She reports previous alcohol use. She reports that she does not use drugs.  Allergies:  Allergies  Allergen Reactions  . Citalopram Hydrobromide Hives  . Morphine And Related Itching  . Tape Other (See Comments)    PLASTIC, CLEAR TAPE STICKS TO THE SKIN AND POSSIBLY TEARS IT- PLEASE USE PAPER TAPE    Medications:  Scheduled: . sodium zirconium cyclosilicate  10 g Oral Once     Results for orders placed or performed during the hospital encounter of 10/31/19 (from the past 48 hour(s))  Troponin I (High Sensitivity)     Status: Abnormal   Collection Time: 10/31/19  2:51 PM  Result Value Ref Range   Troponin I (High Sensitivity) 83 (H) <18 ng/L  Comment: (NOTE) Elevated high sensitivity troponin I (hsTnI) values and significant  changes across serial measurements may suggest ACS but many other  chronic and acute conditions are known to elevate hsTnI results.  Refer to the "Links" section for chest pain algorithms and additional  guidance. Performed at [facility], [place] [address], [place], [place] [address]   CBC with Differential/Platelet     Status: Abnormal   Collection Time: 10/31/19  2:51 PM  Result Value Ref Range   WBC 7.7 4.0 - 10.5 K/uL   RBC 3.34 (L) 3.87 - 5.11 MIL/uL   Hemoglobin 11.5 (L) 12.0 - 15.0 g/dL   HCT 36.3 36.0 - 46.0 %   MCV 108.7 (H) 80.0 - 100.0 fL   MCH 34.4 (H) 26.0 - 34.0 pg   MCHC 31.7 30.0 - 36.0 g/dL   RDW 15.6 (H) 11.5 - 15.5 %   Platelets 210 150 - 400 K/uL   nRBC 0.0 0.0 -  0.2 %   Neutrophils Relative % 77 %   Neutro Abs 6.0 1.7 - 7.7 K/uL   Lymphocytes Relative 9 %   Lymphs Abs 0.7 0.7 - 4.0 K/uL   Monocytes Relative 8 %   Monocytes Absolute 0.6 0.1 - 1.0 K/uL   Eosinophils Relative 4 %   Eosinophils Absolute 0.3 0.0 - 0.5 K/uL   Basophils Relative 1 %   Basophils Absolute 0.1 0.0 - 0.1 K/uL   Immature Granulocytes 1 %   Abs Immature Granulocytes 0.04 0.00 - 0.07 K/uL    Comment: Performed at [facility], [address] [place], [place] [address]  Comprehensive metabolic panel     Status: Abnormal   Collection Time: 10/31/19  2:51 PM  Result Value Ref Range   Sodium 140 135 - 145 mmol/L   Potassium 5.9 (H) 3.5 - 5.1 mmol/L   Chloride 93 (L) 98 - 111 mmol/L   CO2 32 22 - 32 mmol/L   Glucose, Bld 112 (H) 70 - 99 mg/dL   BUN 72 (H) 8 - 23 mg/dL   Creatinine, Ser 12.90 (H) 0.44 - 1.00 mg/dL   Calcium 9.7 8.9 - 10.3 mg/dL   Total Protein 6.8 6.5 - 8.1 g/dL   Albumin 3.8 3.5 - 5.0 g/dL   AST 20 15 - 41 U/L   ALT 16 0 - 44 U/L   Alkaline Phosphatase 99 38 - 126 U/L   Total Bilirubin 0.5 0.3 - 1.2 mg/dL   GFR calc non Af Amer 3 (L) >60 mL/min   GFR calc Af Amer 3 (L) >60 mL/min   Anion gap 15 5 - 15    Comment: Performed at [facility], [place] [address] [place], [place] [address]  Brain natriuretic peptide (order if patient c/o SOB ONLY)     Status: Abnormal   Collection Time: 10/31/19  3:14 PM  Result Value Ref Range   B Natriuretic Peptide 3,389.6 (H) 0.0 - 100.0 pg/mL    Comment: Performed at [facility] [address] [place], [place] [address]  Troponin I (High Sensitivity)     Status: Abnormal   Collection Time: 10/31/19  6:03 PM  Result Value Ref Range   Troponin I (High Sensitivity) 91 (H) <18 ng/L    Comment: (NOTE) Elevated high sensitivity troponin I (hsTnI) values and significant  changes across serial measurements may suggest ACS but many other  chronic and acute conditions are known to elevate hsTnI results.   Refer  to the "Links" section for chest pain algorithms and additional  guidance. Performed at [facility], [place] [address] [place], [place] [address]     DG Chest Portable 1 View  Result Date: 10/31/2019 CLINICAL DATA:  Chest pain and shortness of breath EXAM: PORTABLE CHEST 1 VIEW COMPARISON:  October 19, 2019 FINDINGS: There is slight scarring in the bases. There is no edema or consolidation. Heart is upper normal in size with pulmonary vascularity normal. No adenopathy. There is aortic atherosclerosis. There are foci of calcification in each subclavian and axillary artery. No bone lesions. IMPRESSION: Mild bibasilar scarring. No edema or consolidation. Stable cardiac silhouette. No adenopathy. There is extensive arterial vascular calcification. Aortic Atherosclerosis (ICD10-I70.0). Electronically Signed   By: Lowella Grip III M.D.   On: 10/31/2019 15:59    ROS: all other systems reviewed and are negative except as per HPI Blood pressure (!) 142/99, pulse 99, temperature 99 F (37.2 C), temperature source Oral, resp. rate 19, SpO2 94 %. .  GEN NAD, sitting on stretcher HEENT EOMI PERRL NECK no JVD PULM normal WOB, bibasilar coarse crackles CV RRR no m/r/g ABD soft, nontender EXT no LE edema NEURO AAO x 3 nonfocal no asterixis SKIN mult bruises on L forearm, R elbow, L shin, middle of back ACCESS R RC AVF + T/B  Assessment/Plan: 1 CP/ SOB: relieved by 1 NTG.  Trops flat, TWI inferior leads which are unchanged from 12/3.  Per primary 2 ESRD: missed Monday, got lokelma for mild hyperK, will give another dose tonight and put on sched for in AM. Normally MWF 3 Hypertension: improve with UF 4. Anemia of ESRD: ESA as appropriate 5. Metabolic Bone Disease: binders and vitamins 6.  Deconditioning/ falls: previously refused SNF, may be worth revisiting for short-term rehab as she will likely have ongoing difficulty getting to HD   [place], [place] 10/31/2019, 9:12 PM

## 2019-10-31 NOTE — ED Triage Notes (Signed)
Ar[facility] with chest pain that started yesterday at 1030 while watching tv worse today with some sob. PT has also had frequent falls at home due to weakness and leg tremors.  Pt was given 1 sl nitro tablet with ems which pt states "almost took all the pain away" 1/10 right now

## 2019-10-31 NOTE — ED Provider Notes (Signed)
[place] [facility] EMERGENCY DEPARTMENT Provider Note   CSN: [id number] Arrival date & time: 10/31/19  1433     History Chief Complaint  Patient presents with  . Chest Pain    Kari Robinson is a 68 y.o. female.  Patient with history of hypertension, hyperlipidemia, ESRD on HD(M/W/F), CAD s/p stents, COPD, oxygen dependent on 3 L of nasal cannula oxygen, tobacco abuse, and lung cancer s/p resection -- presents to the ED c/o CP. She was just discharged from the hospital on 10/23/2019 for CP and SOB. She had cardiology evaluation. They recommended continued medical management after discussion with the patient, although they would consider additional work-up if symptoms changed.  Her primary complaint today is recurrence of her chest pain.  Elevated troponins at that time thought 2/2 demand ischemia.  Patient states that around 10am she was lying in bed watching TV when she developed chest pain in the middle lower part of her chest.  She has associated shortness of breath with the pain.  No vomiting or diaphoresis.  The pain does not radiate.  It is nonexertional.  She denies fever, cough, chills.  She denies worsening lower extremity swelling or lower extremity tenderness or pain.  Eventually she called EMS who administered nitroglycerin.  She states that the nitroglycerin improved her pain immediately.  Patient has multiple other complaints stating multiple falls, that her arms and legs "have a mind of their own", stuttering speech at times. Patient denies signs of stroke including: facial droop, slurred speech, aphasia, weakness/numbness in extremities, imbalance/trouble walking.  She denies any changes to her home oxygen which is typically on 3 L.  She is able to lie flat without becoming short of breath and denies worsening heart failure symptoms.  She reports going to dialysis and having a normal session yesterday.  Patient is a somewhat poor historian.  She is unable to give me a lot  of details about her recent hospitalization.        Past Medical History:  Diagnosis Date  . Alcohol abuse   . Arthralgia   . CAD (coronary artery disease) 2006, 2016   stents when in [place]  . Chronic combined systolic and diastolic HF (heart failure) ([place])   . COPD (chronic obstructive pulmonary disease) ([place])   . Depression   . Dyspnea   . ESRD (end stage renal disease) ([place])   . HTN (hypertension)   . Hypercholesterolemia    primarily ldl-p and small particles  . Lung cancer [facility])    Patient reports this with surgery.  No details.    [name] RAS (renal artery stenosis) ([place]) 2002   by cath tysinger  . Smoking     Patient Active Problem List   Diagnosis Date Noted  . Weakness 10/20/2019  . Chest pain with moderate risk for cardiac etiology, hx CAD 10/19/2019  . Elevated troponin 10/19/2019  . Hyperkalemia 10/19/2019  . Pneumonia 07/24/2019  . Dyspnea 07/24/2019  . Lumbar stenosis 05/09/2019  . Leg weakness, bilateral 04/01/2019  . Tobacco abuse 04/01/2019  . Alcohol abuse 04/01/2019  . ESRD (end stage renal disease) on dialysis ([place]) 02/24/2019  . Seizure-like activity ([place]) 02/24/2019  . Ischemic cardiomyopathy 02/24/2019  . Type II diabetes mellitus with renal manifestations ([place]) 09/30/2018  . HLD (hyperlipidemia) 09/30/2018  . Depression with anxiety 09/30/2018  . Anemia of chronic disease 09/30/2018  . COPD (chronic obstructive pulmonary disease) ([place])   . Solitary right kidney 06/24/2018  . Chronic systolic CHF (congestive heart  failure) ([place]) 06/24/2018  . CKD (chronic kidney disease) stage 5, GFR less than 15 ml/min ([name]) 06/07/2018  . Normocytic normochromic anemia 06/07/2018  . DM (diabetes mellitus), type 2 with renal complications ([place]) 47/82/9562  . Occlusion and stenosis of carotid artery without mention of cerebral infarction 01/19/2012  . RENAL ARTERY STENOSIS 09/10/2010  . DEPRESSION 08/11/2010  . PERIPHERAL VASCULAR DISEASE 08/11/2010  .  Essential hypertension 08/08/2010  . Coronary atherosclerosis 08/08/2010    Past Surgical History:  Procedure Laterality Date  . abdominal aortogram     perclose of the right femoral artery  . AV FISTULA PLACEMENT Right 07/11/2018   Procedure: Right arm ARTERIOVENOUS FISTULA CREATION;  Surgeon: [name], MD;  Location: [place];  Service: Vascular;  Laterality: Right;  . AVF placement Right   . CARDIAC CATHETERIZATION     left heart catheterization.  Coronary cineangiography. Lft ventricular cineangiography.    [name] LUNG SURGERY     due to lung cancer per patient's report  . TUBAL LIGATION       OB History   No obstetric history on file.     Family History  Problem Relation Age of Onset  . Emphysema Father   . Cancer Brother     Social History   Tobacco Use  . Smoking status: Former Smoker    Years: 35.00    Types: Cigarettes    Quit date: 05/16/2019    Years since quitting: 0.4  . Smokeless tobacco: Never Used  . Tobacco comment: 3-4 cigarettes per day  Substance Use Topics  . Alcohol use: Not Currently    Comment: states has quit drinking "a few months ago"  . Drug use: Never    Home Medications Prior to Admission medications   Medication Sig Start Date End Date Taking? Authorizing Provider  albuterol (PROVENTIL HFA;VENTOLIN HFA) 108 (90 Base) MCG/ACT inhaler Inhale 2 puffs into the lungs every 4 (four) hours as needed for wheezing or shortness of breath.  11/25/17   [provider]  aspirin EC 81 MG tablet Take 81 mg by mouth daily.    [provider]  atorvastatin (LIPITOR) 40 MG tablet Take 40 mg by mouth daily.    [provider]  AURYXIA 1 GM 210 MG(Fe) tablet Take 420-630 mg by mouth See admin instructions. Take 3 tablets (630 mg) by mouth three times daily with meals and 2 tablets (420 mg) by mouth with each snack 04/06/19   [provider]  b complex-vitamin c-folic acid (NEPHRO-VITE) 0.8 MG TABS tablet Take 1 tablet by  mouth See admin instructions. Take 1 tablet by mouth in the morning on Sun/Tues/Thurs/Sat and 1 tablet after dialysis on Mon/Wed/Fri 02/06/19   [provider]  budesonide-formoterol (SYMBICORT) 160-4.5 MCG/ACT inhaler Inhale 2 puffs into the lungs 2 (two) times daily.    [provider]  buPROPion (WELLBUTRIN SR) 150 MG 12 hr tablet Take 150 mg by mouth 2 (two) times daily.    [provider]  clopidogrel (PLAVIX) 75 MG tablet Take 75 mg by mouth daily.    [provider]  ipratropium-albuterol (DUONEB) 0.5-2.5 (3) MG/3ML SOLN Take 3 mLs by nebulization every 6 (six) hours as needed. Patient taking differently: Take 3 mLs by nebulization every 6 (six) hours as needed (for wheezing or shortness of breath).  05/11/19   Dana Allan I, MD  lidocaine-prilocaine (EMLA) cream Apply 1 application topically every Monday, Wednesday, and Friday with hemodialysis. Prior to dialysis 04/06/19   [provider]  metoprolol succinate (TOPROL-XL) 50 MG 24 hr tablet Take 50 mg by mouth daily. Take with or immediately following a meal.    [provider]  Nutritional Supplements (BOOST GLUCOSE CONTROL) LIQD Take 1 Can by mouth 2 (two) times daily.    [provider]  OXYGEN Inhale 3 L/min into the lungs continuous.     [provider]  QUEtiapine (SEROQUEL) 200 MG tablet Take 200 mg by mouth at bedtime.  09/20/19   [provider]    Allergies    Citalopram hydrobromide, Morphine and related, and Tape  Review of Systems   Review of Systems  Constitutional: Negative for diaphoresis and fever.  Eyes: Negative for redness.  Respiratory: Positive for shortness of breath. Negative for cough.   Cardiovascular: Positive for chest pain. Negative for palpitations and leg swelling.  Gastrointestinal: Positive for abdominal pain (Lower abdomen, started about an hour ago per patient). Negative for nausea and vomiting.  Genitourinary:  Negative for dysuria.  Musculoskeletal: Positive for myalgias (Restless legs, chronic). Negative for back pain and neck pain.  Skin: Negative for rash.  Neurological: Positive for speech difficulty (Transient, chronic). Negative for syncope and light-headedness.  Psychiatric/Behavioral: The patient is not nervous/anxious.     Physical Exam Updated Vital Signs BP (!) 180/112 (BP Location: Right Arm)   Pulse 100   Temp 99 F (37.2 C) (Oral)   Resp 16   SpO2 100%   Physical Exam Vitals and nursing note reviewed.  Constitutional:      Appearance: She is well-developed. She is not diaphoretic.  HENT:     Head: Normocephalic and atraumatic.     Mouth/Throat:     Mouth: Mucous membranes are not dry.  Eyes:     General:        Right eye: No discharge.        Left eye: No discharge.     Conjunctiva/sclera: Conjunctivae normal.  Neck:     Vascular: Normal carotid pulses. No carotid bruit or JVD.     Trachea: Trachea normal. No tracheal deviation.  Cardiovascular:     Rate and Rhythm: Normal rate and regular rhythm.     Pulses: No decreased pulses.     Heart sounds: Normal heart sounds, S1 normal and S2 normal. No murmur.     Comments: Slightly elevated rate, no murmurs rubs or gallops. Pulmonary:     Effort: Pulmonary effort is normal. No respiratory distress.     Breath sounds: Normal breath sounds. No wheezing or rhonchi.  Chest:     Chest wall: No tenderness.  Abdominal:     General: Bowel sounds are normal.     Palpations: Abdomen is soft.     Tenderness: There is abdominal tenderness. There is no guarding or rebound.     Comments: Patient winces with light and deep palpation over the entire abdomen.  She states that this is "it started about an hour ago".  She did not voluntarily mention this during her history.  Musculoskeletal:        General: Normal range of motion.     Cervical back: Normal range of motion and neck supple. No muscular tenderness.     Right lower leg:  No tenderness. No edema.     Left lower leg: No tenderness. No edema.  Skin:    General: Skin is warm and dry.     Coloration: Skin is not pale.  Neurological:     Mental Status: She is  alert.     ED Results / Procedures / Treatments   Labs (all labs ordered are listed, but only abnormal results are displayed) Labs Reviewed  CBC WITH DIFFERENTIAL/PLATELET - Abnormal; Notable for the following components:      Result Value   RBC 3.34 (*)    Hemoglobin 11.5 (*)    MCV 108.7 (*)    MCH 34.4 (*)    RDW 15.6 (*)    All other components within normal limits  COMPREHENSIVE METABOLIC PANEL - Abnormal; Notable for the following components:   Potassium 5.9 (*)    Chloride 93 (*)    Glucose, Bld 112 (*)    BUN 72 (*)    Creatinine, Ser 12.90 (*)    GFR calc non Af Amer 3 (*)    GFR calc Af Amer 3 (*)    All other components within normal limits  BRAIN NATRIURETIC PEPTIDE - Abnormal; Notable for the following components:   B Natriuretic Peptide 3,389.6 (*)    All other components within normal limits  TROPONIN I (HIGH SENSITIVITY) - Abnormal; Notable for the following components:   Troponin I (High Sensitivity) 83 (*)    All other components within normal limits  TROPONIN I (HIGH SENSITIVITY) - Abnormal; Notable for the following components:   Troponin I (High Sensitivity) 91 (*)    All other components within normal limits  SARS CORONAVIRUS 2 (TAT 6-24 HRS)    EKG EKG Interpretation  Date/Time:  Tuesday October 31 2019 14:47:12 EST Ventricular Rate:  101 PR Interval:    QRS Duration: 104 QT Interval:  372 QTC Calculation: 483 R Axis:   80 Text Interpretation: Sinus tachycardia Nonspecific repol abnormality, inferior leads Similar ST depressions inferior leads and elevation in aVL in comparison to ECG 12/3 Confirmed by Gareth Morgan [phone]) on 10/31/2019 3:13:45 PM   Radiology DG Chest Portable 1 View  Result Date: 10/31/2019 CLINICAL DATA:  Chest pain and  shortness of breath EXAM: PORTABLE CHEST 1 VIEW COMPARISON:  October 19, 2019 FINDINGS: There is slight scarring in the bases. There is no edema or consolidation. Heart is upper normal in size with pulmonary vascularity normal. No adenopathy. There is aortic atherosclerosis. There are foci of calcification in each subclavian and axillary artery. No bone lesions. IMPRESSION: Mild bibasilar scarring. No edema or consolidation. Stable cardiac silhouette. No adenopathy. There is extensive arterial vascular calcification. Aortic Atherosclerosis (ICD10-I70.0). Electronically Signed   By: Lowella Grip III M.D.   On: 10/31/2019 15:59    Procedures Procedures (including critical care time)  Medications Ordered in ED Medications  aspirin chewable tablet 324 mg (324 mg Oral Given 10/31/19 1633)  sodium zirconium cyclosilicate (LOKELMA) packet 10 g (10 g Oral Given 10/31/19 1633)    ED Course  I have reviewed the triage vital signs and the nursing notes.  Pertinent labs & imaging results that were available during my care of the patient were reviewed by me and considered in my medical decision making (see chart for details).    MDM Rules/Calculators/A&P                      Patient seen and examined. EKG reviewed.  Patient with multiple presentations with similar symptoms.  Patient had a complete work-up in the hospital during her admission 12/3-05/2019.  My initial impression is that these are recurrent of her chronic symptoms.  Will reevaluate and check labs, chest x-ray, EKG, cardiac markers.  Patient will be monitored.  Currently she is comfortable and not in significant pain.  Vital signs reviewed and are as follows: BP (!) 180/112 (BP Location: Right Arm)   Pulse 100   Temp 99 F (37.2 C) (Oral)   Resp 16   SpO2 100%   ED ECG REPORT   Date: 10/31/2019  Rate: 101  Rhythm: sinus tachycardia  QRS Axis: normal  Intervals: normal  ST/T Wave abnormalities: nonspecific ST/T changes   Conduction Disutrbances:none  Narrative Interpretation:   Old EKG Reviewed: unchanged, similar in appearance to 10/19/2019  I have personally reviewed the EKG tracing and agree with the computerized printout as noted.  Of note, it was recommended during previous hospitalization that patient be discharged to a skilled nursing facility.  She did not want this and ultimate disposition was to home with home health support.  4:14 PM Discussed with Dr. Billy Fischer.   4:30 PM troponin appears flat from previous ED visit.  I updated the patient on results.  We discussed her previous hospitalization and discussion with cardiology.  She states that she does not remember these discussions.   Plan: consult to cardiology given recurrent chest pain. K also noted to be 5.9. Dose of lokelma ordered. I again asked the patient to confirm her last dialysis session was yesterday and twice she says yes. She denies any missed sessions.   6:05 PM Pending cardiology consult. Appreciate recommendations.   8:12 PM Spoke with Rosaria Ferries PA cardiology.  Request medical admission.  Patient is tentatively scheduled for first catheterization in the morning with Dr. Fletcher Anon.  Asked that I coordinate with nephrology as well.  I have spoken with Dr. Hollie Salk of nephrology is aware of patient.  8:38 PM Spoke with Dr. Roel Cluck who will see patient.   Final Clinical Impression(s) / ED Diagnoses Final diagnoses:  Precordial pain  Hyperkalemia   Admit.   Rx / DC Orders ED Discharge Orders    None       Carlisle Cater, Hershal Coria 10/31/19 2039    Gareth Morgan, MD 11/03/19 [phone]

## 2019-10-31 NOTE — Consult Note (Addendum)
The patient has been seen in conjunction with Rosaria Ferries, PA-C. All aspects of care have been considered and discussed. The patient has been personally interviewed, examined, and all clinical data has been reviewed.   She has chest pain with typical and atypical features.  High risk factor profile including prior stents.  Needs diagnostic coronary angiography to identify anatomy and help guide therapy.  The patient was counseled to undergo left heart catheterization, coronary angiography, and possible percutaneous coronary intervention with stent implantation. The procedural risks and benefits were discussed in detail. The risks discussed included death, stroke, myocardial infarction, life-threatening bleeding, limb ischemia, kidney injury, allergy, and possible emergency cardiac surgery. The risk of these significant complications were estimated to occur less than 1% of the time. After discussion, the patient has agreed to proceed.  The patient is undecided about whether to proceed.      Cardiology Consultation:   Patient ID: Kari Robinson; [id number]; 06/17/1951   Admit date: 10/31/2019 Date of Consult: 10/31/2019  Primary Care Provider: Tsosie Billing, MD (Inactive) Primary Cardiologist: Minus Breeding, MD  Primary Electrophysiologist:  None   Patient Profile:   Kari Robinson is a 68 y.o. female with a hx of CAD w/ stents in [place] 2005 & 2016, ICM, COPD, lung CA s/p L lung resection 2017, ERSD on HD, PAF, tob use (quit 07/2019), NSTEMI 09/2017 w/ med rx (most meds not filled), S-CHF w/ EF 25% 11/2016 improved to 45-50%06/2019, who is being seen today for the evaluation of chest pain at the request of Dr. Billy Fischer.  History of Present Illness:   Kari Robinson was admitted for chest pain from 12/3-12/05/2019.  She was evaluated by cardiology during that admission.  The plan will up as an outpatient and she wished to change her cardiology MD to Dr. Harrell Gave. She  was to be discharged on aspirin, statin, Plavix, metoprolol.  Since discharge, Ms. Kari Robinson has struggled.  She states she has not missed any dialysis appointments.  She does not think dialysis has had to stop early, but notes that her blood pressure has been low at the end of dialysis, sometimes in the 16L systolic.  She has had problems with tremors and they have made her fall, she fell 3 times yesterday.  She had some chest pain on Sunday night, and Monday, but woke up with chest pain this a.m.  It is her usual chest pain, add in early December.  It got a little bit better with sublingual nitroglycerin.  It was originally a 10/10, is now a 5/10.  Currently, it hurts a little worse with deep inspiration in her chest wall is tender.  She has also had some abdominal pain, and her abdomen is tender with palpation as well.  This is the first episode of chest pain she has had since she left the hospital.  Past Medical History:  Diagnosis Date  . Alcohol abuse   . Arthralgia   . CAD (coronary artery disease) 2006, 2016   stents when in [place]  . Chronic combined systolic and diastolic HF (heart failure) ([place])   . COPD (chronic obstructive pulmonary disease) ([place])   . Depression   . Dyspnea   . ESRD (end stage renal disease) ([place])   . HTN (hypertension)   . Hypercholesterolemia    primarily ldl-p and small particles  . Lung cancer [facility])    Patient reports this with surgery.  No details.    [name] RAS (renal artery stenosis) ([place]) 2002  by cath tysinger  . Smoking     Past Surgical History:  Procedure Laterality Date  . abdominal aortogram     perclose of the right femoral artery  . AV FISTULA PLACEMENT Right 07/11/2018   Procedure: Right arm ARTERIOVENOUS FISTULA CREATION;  Surgeon: [name], MD;  Location: [facility];  Service: Vascular;  Laterality: Right;  . AVF placement Right   . CARDIAC CATHETERIZATION     left heart catheterization.  Coronary cineangiography. Lft ventricular  cineangiography.    [name] LUNG SURGERY     due to lung cancer per patient's report  . TUBAL LIGATION       Prior to Admission medications   Medication Sig Start Date End Date Taking? Authorizing Provider  albuterol (PROVENTIL HFA;VENTOLIN HFA) 108 (90 Base) MCG/ACT inhaler Inhale 2 puffs into the lungs every 4 (four) hours as needed for wheezing or shortness of breath.  11/25/17   [provider]  aspirin EC 81 MG tablet Take 81 mg by mouth daily.    [provider]  atorvastatin (LIPITOR) 40 MG tablet Take 40 mg by mouth daily.    [provider]  AURYXIA 1 GM 210 MG(Fe) tablet Take 420-630 mg by mouth See admin instructions. Take 3 tablets (630 mg) by mouth three times daily with meals and 2 tablets (420 mg) by mouth with each snack 04/06/19   [provider]  b complex-vitamin c-folic acid (NEPHRO-VITE) 0.8 MG TABS tablet Take 1 tablet by mouth See admin instructions. Take 1 tablet by mouth in the morning on Sun/Tues/Thurs/Sat and 1 tablet after dialysis on Mon/Wed/Fri 02/06/19   [provider]  budesonide-formoterol (SYMBICORT) 160-4.5 MCG/ACT inhaler Inhale 2 puffs into the lungs 2 (two) times daily.    [provider]  buPROPion (WELLBUTRIN SR) 150 MG 12 hr tablet Take 150 mg by mouth 2 (two) times daily.    [provider]  clopidogrel (PLAVIX) 75 MG tablet Take 75 mg by mouth daily.    [provider]  ipratropium-albuterol (DUONEB) 0.5-2.5 (3) MG/3ML SOLN Take 3 mLs by nebulization every 6 (six) hours as needed. Patient taking differently: Take 3 mLs by nebulization every 6 (six) hours as needed (for wheezing or shortness of breath).  05/11/19   Dana Allan I, MD  lidocaine-prilocaine (EMLA) cream Apply 1 application topically every Monday, Wednesday, and Friday with hemodialysis. Prior to dialysis 04/06/19   [provider]  metoprolol succinate (TOPROL-XL) 50 MG 24 hr tablet Take 50 mg by mouth daily. Take  with or immediately following a meal.    [provider]  Nutritional Supplements (BOOST GLUCOSE CONTROL) LIQD Take 1 Can by mouth 2 (two) times daily.    [provider]  OXYGEN Inhale 3 L/min into the lungs continuous.     [provider]  QUEtiapine (SEROQUEL) 200 MG tablet Take 200 mg by mouth at bedtime.  09/20/19   [provider]    Inpatient Medications: Scheduled Meds:  Continuous Infusions:  PRN Meds:   Allergies:    Allergies  Allergen Reactions  . Citalopram Hydrobromide Hives  . Morphine And Related Itching  . Tape Other (See Comments)    PLASTIC, CLEAR TAPE STICKS TO THE SKIN AND POSSIBLY TEARS IT- PLEASE USE PAPER TAPE    Social History:   Social History   Socioeconomic History  . Marital status: Divorced    Spouse name: CAR Buonocore  . Number of children: 5  . Years of education: 9  .  Highest education level: 9th grade  Occupational History  . Occupation: waitress  Tobacco Use  . Smoking status: Former Smoker    Years: 35.00    Types: Cigarettes    Quit date: 05/16/2019    Years since quitting: 0.4  . Smokeless tobacco: Never Used  . Tobacco comment: 3-4 cigarettes per day  Substance and Sexual Activity  . Alcohol use: Not Currently    Comment: states has quit drinking "a few months ago"  . Drug use: Never  . Sexual activity: Not Currently    Birth control/protection: None  Other Topics Concern  . Not on file  Social History Narrative   ** Merged History Encounter **       Social Determinants of Health   Financial Resource Strain: Medium Risk  . Difficulty of Paying Living Expenses: Somewhat hard  Food Insecurity: Food Insecurity Present  . Worried About Charity fundraiser in the Last Year: Sometimes true  . Ran Out of Food in the Last Year: Sometimes true  Transportation Needs: No Transportation Needs  . Lack of Transportation (Medical): No  . Lack of Transportation (Non-Medical): No  Physical Activity:  Inactive  . Days of Exercise per Week: 0 days  . Minutes of Exercise per Session: 0 min  Stress: Stress Concern Present  . Feeling of Stress : Very much  Social Connections: Somewhat Isolated  . Frequency of Communication with Friends and Family: More than three times a week  . Frequency of Social Gatherings with Friends and Family: Once a week  . Attends Religious Services: 1 to 4 times per year  . Active Member of Clubs or Organizations: No  . Attends Archivist Meetings: Never  . Marital Status: Divorced  Human resources officer Violence: Not At Risk  . Fear of Current or Ex-Partner: No  . Emotionally Abused: No  . Physically Abused: No  . Sexually Abused: No    Family History:   Family History  Problem Relation Age of Onset  . Emphysema Father   . Cancer Brother    Family Status:  Family Status  Relation Name Status  . Father  Deceased  . Mother  Deceased  . Brother  Deceased    ROS:  Please see the history of present illness.  All other ROS reviewed and negative.     Physical Exam/Data:   Vitals:   10/31/19 1630 10/31/19 1700 10/31/19 1745 10/31/19 1801  BP:    (!) 168/98  Pulse: 92 97 92 93  Resp: 18 20 20 18   Temp:      TempSrc:      SpO2: 100% 98% 100% 100%   No intake or output data in the 24 hours ending 10/31/19 1818 There were no vitals filed for this visit. There is no height or weight on file to calculate BMI.  General:  Well nourished, well developed, female in no acute distress HEENT: normal for age Lymph: no adenopathy Neck: JVD -minimal elevation Endocrine:  No thryomegaly Vascular: No carotid bruits; 4/4 extremity pulses 2+  Cardiac:  normal S1, S2; RRR; no murmur Lungs:  clear bilaterally, no wheezing, rhonchi or rales  Abd: soft, nontender, no hepatomegaly  Ext: no edema Musculoskeletal:  No deformities, BUE and BLE strength normal and equal Skin: warm and dry  Neuro:  CNs 2-12 intact, no focal abnormalities noted Psych:  Normal  affect   EKG:  The EKG was personally reviewed and demonstrates: Sinus rhythm, no acute ischemic changes Telemetry:  Telemetry  was personally reviewed and demonstrates: Sinus rhythm   CV studies:   ECHO: 06/17/2019 1. The left ventricle has mild-moderately reduced systolic function, with an ejection fraction of 40-45%. The cavity size was normal. There is mildly increased left ventricular wall thickness. Left ventricular diastolic Doppler parameters are consistent with impaired relaxation. Elevated mean left atrial pressure. 2. The right ventricle has normal systolic function. The cavity was normal. There is no increase in right ventricular wall thickness. 3. Left atrial size was mildly dilated. 4. No evidence of mitral valve stenosis. 5. The aortic valve has an indeterminate number of cusps. Severely thickening of the aortic valve. Severe calcifcation of the aortic valve. No stenosis of the aortic valve. 6. The aorta is normal in size and structure. 7. The aortic root is normal in size and structure. 8. Pulmonary hypertension is not present, PASP is 27 mmHg.   Laboratory Data:   Chemistry Recent Labs  Lab 10/31/19 1451  NA 140  K 5.9*  CL 93*  CO2 32  GLUCOSE 112*  BUN 72*  CREATININE 12.90*  CALCIUM 9.7  GFRNONAA 3*  GFRAA 3*  ANIONGAP 15    Lab Results  Component Value Date   ALT 16 10/31/2019   AST 20 10/31/2019   ALKPHOS 99 10/31/2019   BILITOT 0.5 10/31/2019   Hematology Recent Labs  Lab 10/31/19 1451  WBC 7.7  RBC 3.34*  HGB 11.5*  HCT 36.3  MCV 108.7*  MCH 34.4*  MCHC 31.7  RDW 15.6*  PLT 210   Cardiac Enzymes High Sensitivity Troponin:   Recent Labs  Lab 10/19/19 0225 10/19/19 0425 10/31/19 1451  TROPONINIHS 80* 96* 83*      BNP Recent Labs  Lab 10/31/19 1514  BNP 3,389.6*    DDimer No results for input(s): DDIMER in the last 168 hours. TSH:  Lab Results  Component Value Date   TSH 0.749 04/01/2019   Lipids: Lab  Results  Component Value Date   CHOL 125 04/01/2019   HDL 51 04/01/2019   LDLCALC 59 04/01/2019   TRIG 76 04/01/2019   CHOLHDL 2.5 04/01/2019   HgbA1c: Lab Results  Component Value Date   HGBA1C 4.9 04/01/2019   Magnesium:  Magnesium  Date Value Ref Range Status  10/18/2019 3.1 (H) 1.7 - 2.4 mg/dL Final    Comment:    SLIGHT HEMOLYSIS Performed at [facility], [place] [address], [place], [place] [address]      Radiology/Studies:  DG Chest Portable 1 View  Result Date: 10/31/2019 CLINICAL DATA:  Chest pain and shortness of breath EXAM: PORTABLE CHEST 1 VIEW COMPARISON:  October 19, 2019 FINDINGS: There is slight scarring in the bases. There is no edema or consolidation. Heart is upper normal in size with pulmonary vascularity normal. No adenopathy. There is aortic atherosclerosis. There are foci of calcification in each subclavian and axillary artery. No bone lesions. IMPRESSION: Mild bibasilar scarring. No edema or consolidation. Stable cardiac silhouette. No adenopathy. There is extensive arterial vascular calcification. Aortic Atherosclerosis (ICD10-I70.0). Electronically Signed   By: Lowella Grip III M.D.   On: 10/31/2019 15:59    Assessment and Plan:   1.  Chest pain: -She has a mild elevation in her troponin, similar to her cardiac enzymes when she was here in early December -She did get partial relief with nitroglycerin, but is still having some pain that is associated with chest wall tenderness. -Her EF has been as low as 25%, but had improved to 40-45% by  echocardiogram in August -Her non-STEMI in 2018 was treated medically and her left ventricular dysfunction is presumed ischemic cardiomyopathy -Although her blood pressure is very elevated now, she has problems with hypotension at the end of dialysis, limiting medical therapy -It may be appropriate at this time to perform a cardiac catheterization.  That way, we would have concrete information on her anatomy  and might be able to improve her situation.  Otherwise, per IM.  Her regular dialysis day is tomorrow, will need to coordinate this Active Problems:   Chest pain with moderate risk for cardiac etiology, hx CAD   For questions or updates, please contact [place] Please consult [email or web address] for contact info under Cardiology/STEMI.   SignedRosaria Ferries, PA-C  10/31/2019 6:18 PM

## 2019-10-31 NOTE — H&P (Addendum)
Kari Robinson [facility] TSV:[id number] DOB: 1951/11/06 DOA: 10/31/2019     PCP: Tsosie Billing, MD (Inactive)   Outpatient Specialists:   CARDS:   Dr. Harrell Gave NEphrology:   Dr. Hollie Salk   Patient arrived to ER on 10/31/19 at 1433  Patient coming from: home Lives   With SO    Chief Complaint:   Chief Complaint  Patient presents with  . Chest Pain    HPI: Kari Robinson is a 68 y.o. female with medical history significant of  hx of CADw/ stents in [place] 2005 & 2016, ICM, COPD,lung CA s/p L lung resection 2017,ERSD on HD M,W,F PAF, tob use (quit 07/2019), NSTEMI 09/2017 w/ med rx(most meds not filled), S-CHF w/ EF25% 01/2018improved to 45-50%06/2019, DM 2, COPD    Presented with    recurrent admission for chest pain. Reports chest pain started yesterday 1030 while watching TV and today she has had some worsening chest pain some shortness of breath.  She lives at home with her boyfriend she has been having multiple falls.  She has generalized weakness in her legs and to be shaking. Chest pain went away with nitro given by EMS. She is able to lay flat. Is on home oxygen up to 3 L secondary to COPD of note the emergency department noted to be on room air satting 94% .   Patient was just recently admitted with complaints of chest pain at she has extensive history of coronary artery disease with stents placed in the past last cardiac catheterization was 2015. She was to be discharged on aspirin, statin, Plavix, metoprolol.   She also has had an episode of word finding difficulty early in December of this year and underwent imaging which showed artifact versus punctate early subacute infarct of the right frontal lobe  Since her discharge from the hospital she noticed some low blood pressures during hemodialysis she continues to have frequent falls.  Reports taking all her medications  Reports she is no longer smoking or drinking.  Infectious risk factors:  Reports   chest pain,          PCR testing   NEGATIVE    Lab Results  Component Value Date   [place] NEGATIVE 10/31/2019   [place] NEGATIVE 10/19/2019   [place] NEGATIVE 08/06/2019   [place] NEGATIVE 07/23/2019     Regarding pertinent Chronic problems:    Hyperlipidemia -  on statins lipitor   chronic CHF diastolic/systolic/ combined - last echo August 2020 showing EF 40 to 92% diastolic CHF     CAD  - On Aspirin, statin, betablocker, Plavix                 -  followed by cardiology                    DM 2 -  Lab Results  Component Value Date   HGBA1C 4.9 04/01/2019    diet controlled       COPD - on baseline oxygen  3L,      Hx of CVA -   With out residual deficits on Aspirin 81  Plavix    End-stage renal disease on hemodialysis Monday Wednesday Friday   While in ER:  Noted to have persistent mild troponin elevation which is chronic for her. EKG non-acute  Given slightly elevated potassium she was given a dose of [facility]  ER Provider Called: cardiology      They Recommend admit to medicine   Will  see     in ER For cardiac catheterization in a.m.  ER Provider Called: Nephrology     They Recommend admit to medicine   Will see     in ER Plan for hemodialysis in the morning   The following Work up has been ordered so far:  Orders Placed This Encounter  Procedures  . SARS CORONAVIRUS 2 (TAT 6-24 HRS) Nasopharyngeal Nasopharyngeal Swab  . DG Chest Portable 1 View  . CBC with Differential/Platelet  . Comprehensive metabolic panel  . Brain natriuretic peptide (order if patient c/o SOB ONLY)  . Cardiac monitoring  . Initiate Carrier Fluid Protocol  . Inpatient consult to Cardiology  ALL PATIENTS BEING ADMITTED/HAVING PROCEDURES NEED COVID-19 SCREENING  . Inpatient consult to Cardiology  ALL PATIENTS BEING ADMITTED/HAVING PROCEDURES NEED COVID-19 SCREENING  . Consult to hospitalist  ALL PATIENTS BEING ADMITTED/HAVING PROCEDURES NEED COVID-19 SCREENING   . Consult to nephrology  ALL PATIENTS BEING ADMITTED/HAVING PROCEDURES NEED COVID-19 SCREENING  . Pulse oximetry, continuous  . EKG 12-Lead  . Insert peripheral IV   Following Medications were ordered in ER: Medications  sodium zirconium cyclosilicate (LOKELMA) packet 10 g (has no administration in time range)  Chlorhexidine Gluconate Cloth 2 % PADS 6 each (has no administration in time range)  pentafluoroprop-tetrafluoroeth (GEBAUERS) aerosol 1 application (has no administration in time range)  lidocaine (PF) (XYLOCAINE) 1 % injection 5 mL (has no administration in time range)  lidocaine-prilocaine (EMLA) cream 1 application (has no administration in time range)  0.9 %  sodium chloride infusion (has no administration in time range)  0.9 %  sodium chloride infusion (has no administration in time range)  heparin injection 1,000 Units (has no administration in time range)  insulin aspart (novoLOG) injection 0-6 Units (has no administration in time range)  aspirin chewable tablet 324 mg (324 mg Oral Given 10/31/19 1633)  sodium zirconium cyclosilicate (LOKELMA) packet 10 g (10 g Oral Given 10/31/19 1633)        Consult Orders  (From admission, onward)         Start     Ordered   10/31/19 1959  Consult to hospitalist  ALL PATIENTS BEING ADMITTED/HAVING PROCEDURES NEED COVID-19 SCREENING PAGED [facility]  Once    Comments: ALL PATIENTS BEING ADMITTED/HAVING PROCEDURES NEED COVID-19 SCREENING  Provider:  (Not yet assigned)  Question Answer Comment  Place call to: [facility]   Reason for Consult Admit      10/31/19 1959           Significant initial  Findings: Abnormal Labs Reviewed  CBC WITH DIFFERENTIAL/PLATELET - Abnormal; Notable for the following components:      Result Value   RBC 3.34 (*)    Hemoglobin 11.5 (*)    MCV 108.7 (*)    MCH 34.4 (*)    RDW 15.6 (*)    All other components within normal limits  COMPREHENSIVE METABOLIC PANEL - Abnormal; Notable  for the following components:   Potassium 5.9 (*)    Chloride 93 (*)    Glucose, Bld 112 (*)    BUN 72 (*)    Creatinine, Ser 12.90 (*)    GFR calc non Af Amer 3 (*)    GFR calc Af Amer 3 (*)    All other components within normal limits  BRAIN NATRIURETIC PEPTIDE - Abnormal; Notable for the following components:   B Natriuretic Peptide 3,389.6 (*)    All other components within normal limits  TROPONIN I (HIGH SENSITIVITY) -  Abnormal; Notable for the following components:   Troponin I (High Sensitivity) 83 (*)    All other components within normal limits  TROPONIN I (HIGH SENSITIVITY) - Abnormal; Notable for the following components:   Troponin I (High Sensitivity) 91 (*)    All other components within normal limits    Otherwise labs showing:    Recent Labs  Lab 10/31/19 1451  NA 140  K 5.9*  CO2 32  GLUCOSE 112*  BUN 72*  CREATININE 12.90*  CALCIUM 9.7    Cr   Lab Results  Component Value Date   CREATININE 12.90 (H) 10/31/2019   CREATININE 9.78 (H) 10/23/2019   CREATININE 7.59 (H) 10/20/2019    Recent Labs  Lab 10/31/19 1451  AST 20  ALT 16  ALKPHOS 99  BILITOT 0.5  PROT 6.8  ALBUMIN 3.8   Lab Results  Component Value Date   CALCIUM 9.7 10/31/2019   PHOS 4.5 10/23/2019      WBC      Component Value Date/Time   WBC 7.7 10/31/2019 1451   ANC    Component Value Date/Time   NEUTROABS 6.0 10/31/2019 1451   ALC No components found for: LYMPHAB    Plt: Lab Results  Component Value Date   PLT 210 10/31/2019      COVID-19 Labs  No results for input(s): DDIMER, FERRITIN, LDH, CRP in the last 72 hours.  Lab Results  Component Value Date   SARSCOV2NAA NEGATIVE 10/19/2019   SARSCOV2NAA NEGATIVE 08/06/2019   SARSCOV2NAA NEGATIVE 07/23/2019   [place] NEGATIVE 07/12/2019     HG/HCT  stable,       Component Value Date/Time   HGB 11.5 (L) 10/31/2019 1451   HCT 36.3 10/31/2019 1451   HCT 26.0 (L) 06/17/2019 1128     Troponin 91       ECG: Ordered Personally reviewed by me showing: HR :101 Rhythm:   Sinus tachycardia   nonspecific changes  QTC 483   BNP (last 3 results) Recent Labs    08/06/19 0448 08/06/19 1307 10/31/19 1514  BNP 3,516.1* >4,500.0* 3,389.6*    ProBNP (last 3 results) No results for input(s): PROBNP in the last 8760 hours.  DM  labs:  HbA1C: Recent Labs    04/01/19 0448  HGBA1C 4.9       Ordered   CXR -mild bibasilar scarring    ED Triage Vitals  Enc Vitals Group     BP 10/31/19 1449 (!) 180/112     Pulse Rate 10/31/19 1445 (!) 103     Resp 10/31/19 1445 19     Temp 10/31/19 1449 99 F (37.2 C)     Temp Source 10/31/19 1449 Oral     SpO2 10/31/19 1445 100 %     Weight --      Height --      Head Circumference --      Peak Flow --      Pain Score 10/31/19 1450 1     Pain Loc --      Pain Edu? --      Excl. in [name]? --   TMAX(24)@       Latest  Blood pressure (!) 168/98, pulse 93, temperature 99 F (37.2 C), temperature source Oral, resp. rate 18, SpO2 100 %.    Hospitalist was called for admission for chest pain evaluation   Review of Systems:    Pertinent positives include:  Fatigue  chest pain,   Constitutional:  No weight  loss, night sweats, Fevers, chills, weight loss  HEENT:  No headaches, Difficulty swallowing,Tooth/dental problems,Sore throat,  No sneezing, itching, ear ache, nasal congestion, post nasal drip,  Cardio-vascular:  NoOrthopnea, PND, anasarca, dizziness, palpitations.no Bilateral lower extremity swelling  GI:  No heartburn, indigestion, abdominal pain, nausea, vomiting, diarrhea, change in bowel habits, loss of appetite, melena, blood in stool, hematemesis Resp:  no shortness of breath at rest. No dyspnea on exertion, No excess mucus, no productive cough, No non-productive cough, No coughing up of blood.No change in color of mucus.No wheezing. Skin:  no rash or lesions. No jaundice GU:  no dysuria, change in color of urine, no urgency  or frequency. No straining to urinate.  No flank pain.  Musculoskeletal:  No joint pain or no joint swelling. No decreased range of motion. No back pain.  Psych:  No change in mood or affect. No depression or anxiety. No memory loss.  Neuro: no localizing neurological complaints, no tingling, no weakness, no double vision, no gait abnormality, no slurred speech, no confusion  All systems reviewed and apart from [place] all are negative  Past Medical History:   Past Medical History:  Diagnosis Date  . Alcohol abuse   . Arthralgia   . CAD (coronary artery disease) 2006, 2016   stents when in [place]  . Chronic combined systolic and diastolic HF (heart failure) ([place])   . COPD (chronic obstructive pulmonary disease) ([place])   . Depression   . Dyspnea   . ESRD (end stage renal disease) ([place])   . HTN (hypertension)   . Hypercholesterolemia    primarily ldl-p and small particles  . Lung cancer [facility])    Patient reports this with surgery.  No details.    [name] RAS (renal artery stenosis) ([place]) 2002   by cath tysinger  . Smoking       Past Surgical History:  Procedure Laterality Date  . abdominal aortogram     perclose of the right femoral artery  . AV FISTULA PLACEMENT Right 07/11/2018   Procedure: Right arm ARTERIOVENOUS FISTULA CREATION;  Surgeon: [name], MD;  Location: [facility];  Service: Vascular;  Laterality: Right;  . AVF placement Right   . CARDIAC CATHETERIZATION     left heart catheterization.  Coronary cineangiography. Lft ventricular cineangiography.    [name] LUNG SURGERY     due to lung cancer per patient's report  . TUBAL LIGATION      Social History:  Ambulatory  walker       reports that she quit smoking about 5 months ago. Her smoking use included cigarettes. She quit after 35.00 years of use. She has never used smokeless tobacco. She reports previous alcohol use. She reports that she does not use drugs.   Family History:   Family History  Problem Relation  Age of Onset  . Emphysema Father   . Cancer Brother     Allergies: Allergies  Allergen Reactions  . Citalopram Hydrobromide Hives  . Morphine And Related Itching  . Tape Other (See Comments)    PLASTIC, CLEAR TAPE STICKS TO THE SKIN AND POSSIBLY TEARS IT- PLEASE USE PAPER TAPE     Prior to Admission medications   Medication Sig Start Date End Date Taking? Authorizing Provider  albuterol (PROVENTIL HFA;VENTOLIN HFA) 108 (90 Base) MCG/ACT inhaler Inhale 2 puffs into the lungs every 4 (four) hours as needed for wheezing or shortness of breath.  11/25/17   [provider]  aspirin EC 81 MG tablet  Take 81 mg by mouth daily.    [provider]  atorvastatin (LIPITOR) 40 MG tablet Take 40 mg by mouth daily.    [provider]  AURYXIA 1 GM 210 MG(Fe) tablet Take 420-630 mg by mouth See admin instructions. Take 3 tablets (630 mg) by mouth three times daily with meals and 2 tablets (420 mg) by mouth with each snack 04/06/19   [provider]  b complex-vitamin c-folic acid (NEPHRO-VITE) 0.8 MG TABS tablet Take 1 tablet by mouth See admin instructions. Take 1 tablet by mouth in the morning on Sun/Tues/Thurs/Sat and 1 tablet after dialysis on Mon/Wed/Fri 02/06/19   [provider]  budesonide-formoterol (SYMBICORT) 160-4.5 MCG/ACT inhaler Inhale 2 puffs into the lungs 2 (two) times daily.    [provider]  buPROPion (WELLBUTRIN SR) 150 MG 12 hr tablet Take 150 mg by mouth 2 (two) times daily.    [provider]  clopidogrel (PLAVIX) 75 MG tablet Take 75 mg by mouth daily.    [provider]  ipratropium-albuterol (DUONEB) 0.5-2.5 (3) MG/3ML SOLN Take 3 mLs by nebulization every 6 (six) hours as needed. Patient taking differently: Take 3 mLs by nebulization every 6 (six) hours as needed (for wheezing or shortness of breath).  05/11/19   Dana Allan I, MD  lidocaine-prilocaine (EMLA) cream Apply 1 application topically every  Monday, Wednesday, and Friday with hemodialysis. Prior to dialysis 04/06/19   [provider]  metoprolol succinate (TOPROL-XL) 50 MG 24 hr tablet Take 50 mg by mouth daily. Take with or immediately following a meal.    [provider]  Nutritional Supplements (BOOST GLUCOSE CONTROL) LIQD Take 1 Can by mouth 2 (two) times daily.    [provider]  OXYGEN Inhale 3 L/min into the lungs continuous.     [provider]  QUEtiapine (SEROQUEL) 200 MG tablet Take 200 mg by mouth at bedtime.  09/20/19   [provider]   Physical Exam: Blood pressure (!) 168/98, pulse 93, temperature 99 F (37.2 C), temperature source Oral, resp. rate 18, SpO2 100 %. 1. General:  in No Acute distress   Chronically ill -appearing 2. Psychological: Alert and   Oriented 3. Head/ENT:    Dry Mucous Membranes                          Head Non traumatic, neck supple                            Poor Dentition 4. SKIN: normal Skin turgor,  Skin clean Dry and intact no rash 5. Heart: Regular rate and rhythm no  Murmur, no Rub or gallop 6. Lungs:  no wheezes or crackles   7. Abdomen: Soft,  non-tender, Non distended  bowel sounds present 8. Lower extremities: no clubbing, cyanosis, no  edema 9. Neurologically Grossly intact, moving all 4 extremities equally   10. MSK: Normal range of motion   All other LABS:     Recent Labs  Lab 10/31/19 1451  WBC 7.7  NEUTROABS 6.0  HGB 11.5*  HCT 36.3  MCV 108.7*  PLT 210     Recent Labs  Lab 10/31/19 1451  NA 140  K 5.9*  CL 93*  CO2 32  GLUCOSE 112*  BUN 72*  CREATININE 12.90*  CALCIUM 9.7     Recent Labs  Lab 10/31/19 1451  AST 20  ALT 16  ALKPHOS 99  BILITOT 0.5  PROT 6.8  ALBUMIN 3.8       Cultures: No results found for: SDES, SPECREQUEST, CULT, REPTSTATUS   Radiological Exams on Admission: DG Chest Portable 1 View  Result Date: 10/31/2019 CLINICAL DATA:  Chest pain and shortness of breath EXAM:  PORTABLE CHEST 1 VIEW COMPARISON:  October 19, 2019 FINDINGS: There is slight scarring in the bases. There is no edema or consolidation. Heart is upper normal in size with pulmonary vascularity normal. No adenopathy. There is aortic atherosclerosis. There are foci of calcification in each subclavian and axillary artery. No bone lesions. IMPRESSION: Mild bibasilar scarring. No edema or consolidation. Stable cardiac silhouette. No adenopathy. There is extensive arterial vascular calcification. Aortic Atherosclerosis (ICD10-I70.0). Electronically Signed   By: Lowella Grip III M.D.   On: 10/31/2019 15:59    Chart has been reviewed    Assessment/Plan  68 y.o. female with medical history significant of  hx of CADw/ stents in [place] 2005 & 2016, ICM, COPD,lung CA s/p L lung resection 2017,ERSD on HD M,W,F PAF, tob use (quit 07/2019), NSTEMI 09/2017 w/ med rx(most meds not filled), S-CHF w/ EF25% 01/2018improved to 45-50%06/2019, DM 2, COPDAdmitted for chest pain evaluation versus unstable angina  Present on Admission: . Chest pain with moderate risk for cardiac etiology, hx CAD/Unstable angina [facility]) -appreciate cardiology input seen patient in emergency department planning for cardiac catheterization in the morning.  Currently chest pain-free troponin stable   . Essential hypertension-Home medications if able to tolerate . Coronary atherosclerosis appreciate cardiology input plan for cardiac catheterization  . Normocytic normochromic anemia currently stable continue home medications  . Chronic systolic CHF (congestive heart failure) ([name]) currently appears to be euvolemic volume controlled with dialysis  . COPD (chronic obstructive pulmonary disease) ([place]) -continue home medications and oxygen  . Type II diabetes mellitus with renal manifestations ([name]) - - Order Sensitive   SSI   -  check TSH and HgA1C    . HLD (hyperlipidemia) chronic stable continue home medications  . Elevated  troponin -stable continue to cycle  . Hyperkalemia - was treated in ER, plant to dialyze in AM   Debility - Pt Ot assessment Other plan as per orders.  DVT prophylaxis:  SCD   Code Status:  FULL CODE  as per patient   I had personally discussed CODE STATUS with patient    Family Communication:   Family not at  Bedside    Disposition Plan:     To home once workup is complete and patient is stable                                        Consults called: Cardiology and nephrology  Admission status:  ED Disposition    ED Disposition Condition [place]: [place] [[id number]]  Level of Care: Telemetry Cardiac [[id number]]  Covid Evaluation: Confirmed COVID Negative  Diagnosis: Unstable angina [facility]) [[id number]]  Admitting Physician: Toy Baker [[id number]]  Attending Physician: Toy Baker [[id number]]  Estimated length of stay: 3 - 4 days  Certification:: I certify this patient will need inpatient services for at least 2 midnights         inpatient     Expect 2 midnight stay secondary to severity of patient's current illness including   Severe lab/radiological/exam abnormalities including: Elevated troponin   and extensive comorbidities including:  DM2   CHF   CAD  COPD/asthma ESRD on dialysis  malignancy,   That are currently affecting medical management.   I expect  patient to be hospitalized for 2 midnights requiring inpatient medical care.  Patient is at high risk for adverse outcome (such as loss of life or disability) if not treated.  Indication for inpatient stay as follows:    Hemodynamic instability despite maximal medical therapy,      Need for operative/procedural  intervention      Level of care    tele  For  24H    Precautions: admitted as  Covid Negative  No active isolations    PPE: Used by the provider:   P100  eye Goggles,  Gloves   Licia Harl 10/31/2019, 11:11 PM     [facility]      after 2 AM please page floor coverage PA If 7AM-7PM, please contact the day team taking care of the patient using [email or web address]

## 2019-11-01 ENCOUNTER — Inpatient Hospital Stay (HOSPITAL_COMMUNITY): Payer: Medicare Other

## 2019-11-01 ENCOUNTER — Other Ambulatory Visit: Payer: Self-pay

## 2019-11-01 ENCOUNTER — Encounter (HOSPITAL_COMMUNITY)
Admission: EM | Disposition: A | Discharge status: Skilled Nursing Facility | Payer: Self-pay | Source: Home / Self Care | Attending: Internal Medicine

## 2019-11-01 ENCOUNTER — Encounter (HOSPITAL_COMMUNITY): Payer: Self-pay | Admitting: Internal Medicine

## 2019-11-01 DIAGNOSIS — E785 Hyperlipidemia, unspecified: Secondary | ICD-10-CM

## 2019-11-01 DIAGNOSIS — E1121 Type 2 diabetes mellitus with diabetic nephropathy: Secondary | ICD-10-CM

## 2019-11-01 DIAGNOSIS — I10 Essential (primary) hypertension: Secondary | ICD-10-CM

## 2019-11-01 DIAGNOSIS — R778 Other specified abnormalities of plasma proteins: Secondary | ICD-10-CM

## 2019-11-01 DIAGNOSIS — Z992 Dependence on renal dialysis: Secondary | ICD-10-CM

## 2019-11-01 DIAGNOSIS — I2510 Atherosclerotic heart disease of native coronary artery without angina pectoris: Secondary | ICD-10-CM

## 2019-11-01 DIAGNOSIS — N186 End stage renal disease: Secondary | ICD-10-CM

## 2019-11-01 HISTORY — PX: LEFT HEART CATH AND CORONARY ANGIOGRAPHY: CATH118249

## 2019-11-01 LAB — COMPREHENSIVE METABOLIC PANEL
ALT: 14 U/L (ref 0–44)
AST: 17 U/L (ref 15–41)
Albumin: 3.2 g/dL — ABNORMAL LOW (ref 3.5–5.0)
Alkaline Phosphatase: 91 U/L (ref 38–126)
Anion gap: 15 (ref 5–15)
BUN: 80 mg/dL — ABNORMAL HIGH (ref 8–23)
CO2: 30 mmol/L (ref 22–32)
Calcium: 9.4 mg/dL (ref 8.9–10.3)
Chloride: 95 mmol/L — ABNORMAL LOW (ref 98–111)
Creatinine, Ser: 12.9 mg/dL — ABNORMAL HIGH (ref 0.44–1.00)
GFR calc Af Amer: 3 mL/min — ABNORMAL LOW (ref >60)
GFR calc non Af Amer: 3 mL/min — ABNORMAL LOW (ref >60)
Glucose, Bld: 90 mg/dL (ref 70–99)
Potassium: 5.1 mmol/L (ref 3.5–5.1)
Sodium: 140 mmol/L (ref 135–145)
Total Bilirubin: 0.3 mg/dL (ref 0.3–1.2)
Total Protein: 5.7 g/dL — ABNORMAL LOW (ref 6.5–8.1)

## 2019-11-01 LAB — BASIC METABOLIC PANEL
Anion gap: 18 — ABNORMAL HIGH (ref 5–15)
BUN: 77 mg/dL — ABNORMAL HIGH (ref 8–23)
CO2: 27 mmol/L (ref 22–32)
Calcium: 9.1 mg/dL (ref 8.9–10.3)
Chloride: 95 mmol/L — ABNORMAL LOW (ref 98–111)
Creatinine, Ser: 12.72 mg/dL — ABNORMAL HIGH (ref 0.44–1.00)
GFR calc Af Amer: 3 mL/min — ABNORMAL LOW (ref >60)
GFR calc non Af Amer: 3 mL/min — ABNORMAL LOW (ref >60)
Glucose, Bld: 89 mg/dL (ref 70–99)
Potassium: 5.5 mmol/L — ABNORMAL HIGH (ref 3.5–5.1)
Sodium: 140 mmol/L (ref 135–145)

## 2019-11-01 LAB — CBC
HCT: 29.5 % — ABNORMAL LOW (ref 36.0–46.0)
Hemoglobin: 9.4 g/dL — ABNORMAL LOW (ref 12.0–15.0)
MCH: 34.3 pg — ABNORMAL HIGH (ref 26.0–34.0)
MCHC: 31.9 g/dL (ref 30.0–36.0)
MCV: 107.7 fL — ABNORMAL HIGH (ref 80.0–100.0)
Platelets: 178 10*3/uL (ref 150–400)
RBC: 2.74 MIL/uL — ABNORMAL LOW (ref 3.87–5.11)
RDW: 15.6 % — ABNORMAL HIGH (ref 11.5–15.5)
WBC: 7.1 10*3/uL (ref 4.0–10.5)
nRBC: 0 % (ref 0.0–0.2)

## 2019-11-01 LAB — TROPONIN I (HIGH SENSITIVITY)
Troponin I (High Sensitivity): 104 ng/L (ref <18)
Troponin I (High Sensitivity): 95 ng/L — ABNORMAL HIGH (ref <18)

## 2019-11-01 LAB — GLUCOSE, CAPILLARY
Glucose-Capillary: 102 mg/dL — ABNORMAL HIGH (ref 70–99)
Glucose-Capillary: 92 mg/dL (ref 70–99)
Glucose-Capillary: 85 mg/dL (ref 70–99)
Glucose-Capillary: 116 mg/dL — ABNORMAL HIGH (ref 70–99)
Glucose-Capillary: 134 mg/dL — ABNORMAL HIGH (ref 70–99)
Glucose-Capillary: 130 mg/dL — ABNORMAL HIGH (ref 70–99)

## 2019-11-01 LAB — PHOSPHORUS: Phosphorus: 7.3 mg/dL — ABNORMAL HIGH (ref 2.5–4.6)

## 2019-11-01 LAB — HEMOGLOBIN A1C
Hgb A1c MFr Bld: 5.1 % (ref 4.8–5.6)
Mean Plasma Glucose: 99.67 mg/dL

## 2019-11-01 LAB — CBG MONITORING, ED: Glucose-Capillary: 94 mg/dL (ref 70–99)

## 2019-11-01 LAB — TSH: TSH: 1.364 u[IU]/mL (ref 0.350–4.500)

## 2019-11-01 LAB — MAGNESIUM: Magnesium: 2.8 mg/dL — ABNORMAL HIGH (ref 1.7–2.4)

## 2019-11-01 SURGERY — LEFT HEART CATH AND CORONARY ANGIOGRAPHY
Anesthesia: LOCAL

## 2019-11-01 MED ORDER — DOXERCALCIFEROL 4 MCG/2ML IV SOLN
1.0000 ug | INTRAVENOUS | Status: DC
  Administered 2019-11-03: 1 ug via INTRAVENOUS
  Filled 2019-11-01 (×3): qty 2

## 2019-11-01 MED ORDER — METHYLPREDNISOLONE SODIUM SUCC 40 MG IJ SOLR
40.0000 mg | Freq: Two times a day (BID) | INTRAMUSCULAR | Status: DC
  Administered 2019-11-02 – 2019-11-03 (×3): 40 mg via INTRAVENOUS
  Filled 2019-11-01 (×3): qty 1

## 2019-11-01 MED ORDER — LIDOCAINE HCL (PF) 1 % IJ SOLN
INTRAMUSCULAR | Status: AC
  Filled 2019-11-01: qty 30

## 2019-11-01 MED ORDER — LIDOCAINE HCL (PF) 1 % IJ SOLN
INTRAMUSCULAR | Status: DC | PRN
  Administered 2019-11-01: 15 mL

## 2019-11-01 MED ORDER — HYDROXYZINE HCL 10 MG PO TABS
10.0000 mg | ORAL_TABLET | Freq: Two times a day (BID) | ORAL | Status: DC | PRN
  Filled 2019-11-01: qty 1

## 2019-11-01 MED ORDER — HEPARIN (PORCINE) IN NACL 1000-0.9 UT/500ML-% IV SOLN
INTRAVENOUS | Status: DC | PRN
  Administered 2019-11-01 (×2): 500 mL

## 2019-11-01 MED ORDER — ALTEPLASE 2 MG IJ SOLR: 2.0000 mg | Freq: Once | INTRAMUSCULAR | Status: DC | PRN

## 2019-11-01 MED ORDER — SODIUM CHLORIDE 0.9 % IV SOLN
62.5000 mg | INTRAVENOUS | Status: DC
  Filled 2019-11-01: qty 5

## 2019-11-01 MED ORDER — SODIUM CHLORIDE 0.9% FLUSH
3.0000 mL | Freq: Two times a day (BID) | INTRAVENOUS | Status: DC
  Administered 2019-11-01: 3 mL via INTRAVENOUS

## 2019-11-01 MED ORDER — SODIUM CHLORIDE 0.9% FLUSH: 3.0000 mL | INTRAVENOUS | Status: DC | PRN

## 2019-11-01 MED ORDER — ASPIRIN 81 MG PO CHEW
81.0000 mg | CHEWABLE_TABLET | ORAL | Status: AC
  Administered 2019-11-01: 81 mg via ORAL
  Filled 2019-11-01: qty 1

## 2019-11-01 MED ORDER — HYDRALAZINE HCL 20 MG/ML IJ SOLN: 10.0000 mg | INTRAMUSCULAR | Status: DC | PRN

## 2019-11-01 MED ORDER — MIDAZOLAM HCL 2 MG/2ML IJ SOLN
INTRAMUSCULAR | Status: AC
  Filled 2019-11-01: qty 2

## 2019-11-01 MED ORDER — FENTANYL CITRATE (PF) 100 MCG/2ML IJ SOLN
INTRAMUSCULAR | Status: AC
  Filled 2019-11-01: qty 2

## 2019-11-01 MED ORDER — MIDAZOLAM HCL 2 MG/2ML IJ SOLN
INTRAMUSCULAR | Status: DC | PRN
  Administered 2019-11-01: 1 mg via INTRAVENOUS

## 2019-11-01 MED ORDER — SODIUM CHLORIDE 0.9 % IV SOLN: 250.0000 mL | INTRAVENOUS | Status: DC | PRN

## 2019-11-01 MED ORDER — FERRIC CITRATE 1 GM 210 MG(FE) PO TABS
420.0000 mg | ORAL_TABLET | ORAL | Status: DC | PRN
  Filled 2019-11-01: qty 2

## 2019-11-01 MED ORDER — HEPARIN (PORCINE) IN NACL 1000-0.9 UT/500ML-% IV SOLN
INTRAVENOUS | Status: AC
  Filled 2019-11-01: qty 1000

## 2019-11-01 MED ORDER — LABETALOL HCL 5 MG/ML IV SOLN
10.0000 mg | INTRAVENOUS | Status: DC | PRN
  Filled 2019-11-01: qty 4

## 2019-11-01 MED ORDER — METHYLPREDNISOLONE SODIUM SUCC 125 MG IJ SOLR
80.0000 mg | Freq: Once | INTRAMUSCULAR | Status: AC
  Administered 2019-11-01: 80 mg via INTRAVENOUS
  Filled 2019-11-01: qty 2

## 2019-11-01 MED ORDER — SODIUM CHLORIDE 0.9% FLUSH
3.0000 mL | Freq: Two times a day (BID) | INTRAVENOUS | Status: DC
  Administered 2019-11-01 – 2019-11-06 (×8): 3 mL via INTRAVENOUS

## 2019-11-01 MED ORDER — HYDROXYZINE HCL 10 MG PO TABS
10.0000 mg | ORAL_TABLET | ORAL | Status: AC
  Administered 2019-11-01: 10 mg via ORAL
  Filled 2019-11-01: qty 1

## 2019-11-01 MED ORDER — ALBUTEROL SULFATE (2.5 MG/3ML) 0.083% IN NEBU
3.0000 mL | INHALATION_SOLUTION | RESPIRATORY_TRACT | Status: DC | PRN
  Administered 2019-11-01: 15:00:00 3 mL via RESPIRATORY_TRACT
  Filled 2019-11-01: qty 3

## 2019-11-01 MED ORDER — IOHEXOL 350 MG/ML SOLN
INTRAVENOUS | Status: DC | PRN
  Administered 2019-11-01: 80 mL

## 2019-11-01 MED ORDER — FENTANYL CITRATE (PF) 100 MCG/2ML IJ SOLN
INTRAMUSCULAR | Status: DC | PRN
  Administered 2019-11-01: 25 ug via INTRAVENOUS

## 2019-11-01 MED ORDER — IPRATROPIUM-ALBUTEROL 0.5-2.5 (3) MG/3ML IN SOLN
3.0000 mL | Freq: Three times a day (TID) | RESPIRATORY_TRACT | Status: DC
  Administered 2019-11-01 – 2019-11-02 (×3): 3 mL via RESPIRATORY_TRACT
  Filled 2019-11-01 (×2): qty 3

## 2019-11-01 MED ORDER — ASPIRIN EC 81 MG PO TBEC
81.0000 mg | DELAYED_RELEASE_TABLET | Freq: Every day | ORAL | Status: DC
  Administered 2019-11-02 – 2019-11-06 (×5): 81 mg via ORAL
  Filled 2019-11-01 (×5): qty 1

## 2019-11-01 MED ORDER — SODIUM CHLORIDE 0.9 % IV SOLN: INTRAVENOUS | Status: DC

## 2019-11-01 MED ORDER — DARBEPOETIN ALFA 200 MCG/0.4ML IJ SOSY
200.0000 ug | PREFILLED_SYRINGE | INTRAMUSCULAR | Status: DC
  Filled 2019-11-01: qty 0.4

## 2019-11-01 MED ORDER — RENA-VITE PO TABS
1.0000 | ORAL_TABLET | Freq: Every day | ORAL | Status: DC
  Administered 2019-11-02 – 2019-11-05 (×4): 1 via ORAL
  Filled 2019-11-01 (×4): qty 1

## 2019-11-01 SURGICAL SUPPLY — 11 items
CATH DXT MULTI JL4 JR4 ANG PIG (CATHETERS) ×1 IMPLANT
CLOSURE MYNX CONTROL 5F (Vascular Products) ×1 IMPLANT
COVER PRB 48X5XTLSCP FOLD TPE (BAG) IMPLANT
COVER PROBE 5X48 (BAG) ×2
KIT HEART LEFT (KITS) ×2 IMPLANT
PACK CARDIAC CATHETERIZATION (CUSTOM PROCEDURE TRAY) ×2 IMPLANT
SHEATH PINNACLE 5F 10CM (SHEATH) ×1 IMPLANT
SYR MEDRAD MARK 7 150ML (SYRINGE) ×2 IMPLANT
TRANSDUCER W/STOPCOCK (MISCELLANEOUS) ×2 IMPLANT
TUBING CIL FLEX 10 FLL-RA (TUBING) ×2 IMPLANT
WIRE EMERALD 3MM-J .035X150CM (WIRE) ×1 IMPLANT

## 2019-11-01 NOTE — Progress Notes (Addendum)
Addendum  I was just paged by patient's RN because patient was having new onset worsening dyspnea.  I arrived promptly to the bedside and evaluated patient.  Patient reports progressive dyspnea since about 1:30 PM.  No chest pain reported.  States that she has used BiPAP in the past.  Appears anxious.  On oxygen via facemask.  Mildly tachypneic. RS: Reduced breath sounds bilaterally with scattered few bilateral medium pitched expiratory rhonchi.  Able to speak in short sentences.  Mild increased work of breathing.  Assessment and plan:  COPD exacerbation with acute on chronic hypoxic respiratory failure, likely complicated by volume overload/pulmonary edema from ESRD and missed HD on Monday.  Requested stat chest x-ray and will review.  IV Solu-Medrol 80 mg x 1 dose followed by 40 mg every 12 hours.  Duo nebs 3 times daily and albuterol nebulizations every 4 hourly as needed.  Oxygen supplement to maintain saturations between 88-92%.  BiPAP as needed.  Discussed with patient's RN and rapid response team at bedside, monitor closely.  Chest x-ray reviewed, concern for pulmonary edema.  Discussed with RN, since my visit, patient is now on BiPAP.  Dialysis will happen tomorrow morning due to emergencies and scheduling issues.  Vernell Leep, MD, [place], [facility]. [facility]  To contact the attending provider between 7A-7P or the covering provider during after hours 7P-7A, please log into the web site [email or web address] and access using universal [place] password for that web site. If you do not have the password, please call the hospital operator.

## 2019-11-01 NOTE — Interval H&P Note (Signed)
History and Physical Interval Note:  11/01/2019 9:37 AM  Kari Robinson  has presented today for surgery, with the diagnosis of NSTEMI.  The various methods of treatment have been discussed with the patient and family. After consideration of risks, benefits and other options for treatment, the patient has consented to  Procedure(s): LEFT HEART CATH AND CORONARY ANGIOGRAPHY (N/A) as a surgical intervention.  The patient's history has been reviewed, patient examined, no change in status, stable for surgery.  I have reviewed the patient's chart and labs.  Questions were answered to the patient's satisfaction.    Cath Lab Visit (complete for each Cath Lab visit)  Clinical Evaluation Leading to the Procedure:   ACS: Yes.    Non-ACS:    Anginal Classification: CCS III  Anti-ischemic medical therapy: Minimal Therapy (1 class of medications)  Non-Invasive Test Results: No non-invasive testing performed  Prior CABG: No previous CABG        Lauree Chandler

## 2019-11-01 NOTE — Evaluation (Signed)
Occupational Therapy Evaluation Patient Details Name: Kari Robinson MRN: [id number] DOB: 22-Dec-1950 Today's Date: 11/01/2019    History of Present Illness Kari Robinson is a 68 y.o. female with medical history significant of hypertension, hyperlipidemia, ESRD on HD(M/W/F), CAD s/p stents, COPD, oxygen dependent on 3 L of nasal cannula oxygen, tobacco abuse, and lung cancer s/p resection.  She presents with complaints chest pain and shortness of breath.  Also reporting she has been having intermittent leg jerking.   Clinical Impression   Patient is a 68 year old female that lives in a single level house, boyfriend lives with her. Patient reports using walker in the house and wheelchair when she goes for dialysis. Patient's s/o moved in to assist her however she has had multiple falls and s/o is unable to physically assist her. Currently patient is requiring moderate assist for bed mobility and intermittent min A to maintain sitting balance due to global tremors causing patient to lose her balance posteriorly. Recommend continued acute OT services during hospitalization due to deficits listed below.     Follow Up Recommendations  SNF;Supervision/Assistance - 24 hour    Equipment Recommendations  Other (comment)(to be determined)       Precautions / Restrictions Precautions Precautions: Fall Precaution Comments: hx falling, s/o unable to assist her up Restrictions Weight Bearing Restrictions: No      Mobility Bed Mobility Overal bed mobility: Needs Assistance Bed Mobility: Supine to Sit;Sit to Supine     Supine to sit: Mod assist;HOB elevated Sit to supine: Mod assist   General bed mobility comments: difficulty mobilizing due to global tremors, cues for sequencing body mechanics  Transfers Overall transfer level: Needs assistance               General transfer comment: unable to attempt as patient was unable to maintain sitting balance, tremulous    Balance Overall  balance assessment: Needs assistance Sitting-balance support: Feet supported;Bilateral upper extremity supported Sitting balance-Leahy Scale: Poor Sitting balance - Comments: difficulty maintaining balance despite cues and physical assist                                   ADL either performed or assessed with clinical judgement   ADL Overall ADL's : Needs assistance/impaired     Grooming: Minimal assistance;Wash/dry face;Sitting Grooming Details (indicate cue type and reason): decreased sitting balance due to global tremors Upper Body Bathing: Minimal assistance;Sitting   Lower Body Bathing: Maximal assistance;Sitting/lateral leans   Upper Body Dressing : Minimal assistance;Sitting   Lower Body Dressing: Maximal assistance;Total assistance;Sitting/lateral leans Lower Body Dressing Details (indicate cue type and reason): unable to don socks without losing balance; pt tremulous during efforts Toilet Transfer: +2 for physical assistance;+2 for safety/equipment Toilet Transfer Details (indicate cue type and reason): unable to attempt sit to stand due to decreased sitting balance, global tremors and patient reporting pain in back d/t herniated disc. anticipate Ax2 for safety + weakness, decreased coordination Toileting- Clothing Manipulation and Hygiene: Maximal assistance;Total assistance;Sitting/lateral lean               Vision Baseline Vision/History: Wears glasses Wears Glasses: At all times              Pertinent Vitals/Pain Pain Assessment: Faces Faces Pain Scale: Hurts even more Pain Location: back Pain Descriptors / Indicators: Aching Pain Intervention(s): Limited activity within patient's tolerance;Monitored during session     Hand Dominance Right  Extremity/Trunk Assessment Upper Extremity Assessment Upper Extremity Assessment: Generalized weakness;RUE deficits/detail;LUE deficits/detail RUE Coordination: decreased fine motor;decreased gross  motor LUE Coordination: decreased fine motor;decreased gross motor   Lower Extremity Assessment Lower Extremity Assessment: Defer to PT evaluation       Communication Communication Communication: Other (comment)(slurred speech )   Cognition Arousal/Alertness: Awake/alert Behavior During Therapy: WFL for tasks assessed/performed Overall Cognitive Status: No family/caregiver present to determine baseline cognitive functioning                                 General Comments: pt A/Ox4 however after OT states never meeting patient before she continues to make comments "I remember how strong you are"    General Comments  4L O2 desat to 87% seated EOB, with cues PLB increase to 92%            Home Living Family/patient expects to be discharged to:: Private residence Living Arrangements: Spouse/significant other Available Help at Discharge: Friend(s);Available 24 hours/day Type of Home: Apartment Home Access: Level entry     Home Layout: One level     Bathroom Shower/Tub: Teacher, early years/pre: Standard Bathroom Accessibility: No   Home Equipment: Clinical cytogeneticist - 4 wheels;Grab bars - tub/shower;Toilet riser   Additional Comments: reports boyfriend quit his job to take care of her      Prior Functioning/Environment Level of Independence: Needs assistance  Gait / Transfers Assistance Needed: uses rollator or cane, takes transportation to dialysis ADL's / Homemaking Assistance Needed: pt reports boyfriend assists with donning socks/ shoes, getting in/out of shower            OT Problem List: Decreased strength;Decreased activity tolerance;Impaired balance (sitting and/or standing);Decreased coordination;Decreased cognition;Decreased safety awareness;Decreased knowledge of use of DME or AE;Pain      OT Treatment/Interventions: Self-care/ADL training;Therapeutic exercise;Energy conservation;DME and/or AE instruction;Therapeutic  activities;Cognitive remediation/compensation;Patient/family education;Balance training    OT Goals(Current goals can be found in the care plan section) Acute Rehab OT Goals Patient Stated Goal: to stop shaking  OT Goal Formulation: With patient Time For Goal Achievement: 11/15/19 Potential to Achieve Goals: Good  OT Frequency: Min 2X/week   Barriers to D/C: Other (comment)  Although pt's s/o lives with her, he is unable to physically assist her           AM-PAC OT "6 Clicks" Daily Activity     Outcome Measure Help from another person eating meals?: A Little Help from another person taking care of personal grooming?: A Little Help from another person toileting, which includes using toliet, bedpan, or urinal?: A Lot Help from another person bathing (including washing, rinsing, drying)?: A Lot Help from another person to put on and taking off regular upper body clothing?: A Little Help from another person to put on and taking off regular lower body clothing?: Total 6 Click Score: 14   End of Session Equipment Utilized During Treatment: Oxygen Nurse Communication: Mobility status  Activity Tolerance: Patient limited by pain;Patient limited by fatigue Patient left: in bed;with call bell/phone within reach;with bed alarm set  OT Visit Diagnosis: Unsteadiness on feet (R26.81);Other abnormalities of gait and mobility (R26.89);History of falling (Z91.81);Muscle weakness (generalized) (M62.81);Other symptoms and signs involving cognitive function;Pain Pain - part of body: (Back)                Time: 2130-8657 OT Time Calculation (min): 25 min Charges:  OT General Charges $OT Visit: 1  Visit OT Evaluation $OT Eval Moderate Complexity: 1 Mod OT Treatments $Self Care/Home Management : 8-22 mins  Shon Millet OT OT office: [place] 11/01/2019, 12:22 PM

## 2019-11-01 NOTE — Progress Notes (Addendum)
Pt noted to have increasing BP and labored breathing.  Breathing treatment given with no improvement in symptoms. Pt was placed on nonrebreather mask and Rapid response, Respiratory therapy and MD were called and made aware.  Pt's breathing became increasingly labored with use of accessory muscles.  Pt's O2 saturation remained in the high 90's.  BP and HR remained elevated.  80 mg IV Solu-Medrol administered per MD order followed by the application of bipap per MD order.  Pt is more calm and comfortable on bipap at this time.  Will continue to monitor.

## 2019-11-01 NOTE — Progress Notes (Signed)
PROGRESS NOTE   Kari Robinson  UEA:[id number]    DOB: [birth year]    DOA: 10/31/2019  PCP: Tsosie Billing, MD (Inactive)   I have briefly reviewed patients previous medical records in [facility].  Chief Complaint:   Chief Complaint  Patient presents with  . Chest Pain    Brief Narrative:  68 year old female with PMH of CAD status post stents, NSTEMI 09/2017, ischemic cardiomyopathy, chronic systolic CHF (EF improved from 25% to 45-50%), essential hypertension, hyperlipidemia, COPD on home oxygen 3 L/min, lung cancer s/p left lung resection 2017, ESRD on MWF HD, PAF, tobacco abuse, recently admitted 12/3-12/7 for chest pain and elevated troponin, cardiology evaluated and recommended medical management, presented to [facility] ED on 12/15 due to chest pain, some relief with sublingual NTG, at times worse with deep inspiration or touching her chest and multiple falls day prior to admission.  Cardiology was consulted and she underwent cardiac cath due to chest pain with typical and atypical features and high risk factor profile including prior stents.  Although cath showed abnormalities, no stent double lesions, medical management recommended.  Nephrology consulting for HD needs and hyperkalemia.  SNF recommended during recent admission but patient declined, is deconditioned and has had frequent falls.  Consulted CIR as per PT recommendations.   Assessment & Plan:  Active Problems:   Essential hypertension   Coronary atherosclerosis   Normocytic normochromic anemia   DM (diabetes mellitus), type 2 with renal complications ([name])   Chronic systolic CHF (congestive heart failure) ([name])   COPD (chronic obstructive pulmonary disease) ([name])   Type II diabetes mellitus with renal manifestations ([name])   HLD (hyperlipidemia)   ESRD (end stage renal disease) on dialysis ([name])   Chest pain with moderate risk for cardiac etiology, hx CAD   Elevated troponin   Hyperkalemia   Weakness    Unstable angina ([place])   CAD s/p stents/chest pain/mild troponin elevation  Cardiology was consulted.  Patient has extensive cardiac history.  Recent admission for similar presentation i.e. chest pain and elevated troponins.  Cardiology felt that she had chest pain with typical and atypical features and was high risk given prior stents.  Underwent cardiac cath 12/16 with detailed report as below, did not require any intervention, medical management recommended and consider alternate causes.  ?  GI versus musculoskeletal etiology of chest pain.  Continue aspirin, Plavix, statins and metoprolol.  ESRD on MWF hemodialysis  Nephrology input appreciated.  Reportedly not able to make the last HD treatment as person who transported her was sick.  HD today 12/16.  Hyperkalemia  Appears to be a recurrent problem, had this during previous admission.  Presented with potassium of 5.9, got a dose of Lokelma, down to 5.1 predialysis today.  Also had missed HD on 12/14.  HD today 12/16.  Secondary hyperparathyroidism  Management per nephrology.  Deconditioning/frequent falls  Patient refused SNF during prior admission.  Do not think that she is safe to return home.  OT recommends SNF and PT recommends CIR.  Consulted CIR for input.  Hopefully she agrees.  Severe COPD/chronic hypoxic respiratory failure on home oxygen 3 L/min/tobacco abuse  Recently quit smoking in September 2020, encouraged and congratulated.  Stable without clinical bronchospasm.  Essential hypertension  Controlled.  Volume management across dialysis.  Continue Toprol-XL.  Hyperlipidemia  Continue statins.  Anemia in ESRD  Her baseline hemoglobin is likely in the 9-10 range.  Aranesp per nephrology.  Continue ferric citrate.  Follow CBCs closely.  Transfuse across dialysis if hemoglobin 7 g or less.  Depression  Stable on Wellbutrin and Seroquel, continue.  Type II DM with renal  manifestations  A1c 5.1.  DC SSI.   DVT prophylaxis: SCDs Code Status: Full Family Communication: None at bedside Disposition: Patient to undergo hemodialysis today for recurrent hyperkalemia in the context of missed HD on 12/14.  Also patient is debilitated, has frequent falls at home and is at high risk for recurrent falls and related serious complications.  She is unsafe to discharge home.  As per therapy recommendations, consulted CIR for input.   Consultants:   Cardiology Nephrology Rehab MD  Procedures:   Cardiac cath 11/01/2019:   Prox RCA to Mid RCA lesion is 30% stenosed.  Prox Cx to Mid Cx lesion is 20% stenosed.  Mid Cx lesion is 50% stenosed.  Ost Cx to Prox Cx lesion is 40% stenosed.  Ost LAD to Mid LAD lesion is 30% stenosed.  1st Sept lesion is 60% stenosed.  There is moderate left ventricular systolic dysfunction.  LV end diastolic pressure is moderately elevated.  The left ventricular ejection fraction is 35-45% by visual estimate.  There is mild (2+) mitral regurgitation.   1. Diffuse, mild non-obstructive disease in the LAD  2. Patent stent in the mid Circumflex with minimal restenosis. Moderate stenosis just beyond the stented segment of the mid Circumflex artery. This does not appear to be flow limiting. 3. Mild non-obstructive disease in the mid RCA 4. Moderate, global LV systolic dysfunction. 5. Elevated filling pressures  Recommendations; Medical management of CAD. Explore other causes of chest pain.   Antimicrobials:   None   Subjective:  Patient seen this morning prior to cardiac cath.  Interviewed and examined along with female RN in room.  Reports that her chest pain resolved sometime overnight of admission.  Reports some lower substernal tenderness on palpation but denies chest pain on deep inspiration.  Had some dyspnea on admission but back to baseline, has chronic dyspnea.  States that she lives with her boyfriend, has all DME i.e.  wheelchair, cane and walker but does not use.  Objective:   Vitals:   11/01/19 1024 11/01/19 1029 11/01/19 1058 11/01/19 1100  BP: (!) 148/89 (!) 150/90 139/90 139/90  Pulse: 84 82 82 82  Resp: 14 16 18 18   Temp:      TempSrc:      SpO2: 98% 98% 100% 99%  Weight:      Height:        General exam: Pleasant middle-age female, small built and thinly nourished lying comfortably propped up in bed without distress.  Edentulous. Respiratory system: Clear to auscultation. Respiratory effort normal.  No reproducible chest wall tenderness. Cardiovascular system: S1 & S2 heard, RRR. No JVD, murmurs, rubs, gallops or clicks. No pedal edema.  Telemetry personally reviewed: Sinus rhythm. Gastrointestinal system: Abdomen is nondistended, soft and nontender. No organomegaly or masses felt. Normal bowel sounds heard. Central nervous system: Alert and oriented. No focal neurological deficits. Extremities: Symmetric 5 x 5 power. Skin: No rashes, lesions or ulcers Psychiatry: Judgement and insight appear normal. Mood & affect appropriate.     Data Reviewed:   I have personally reviewed following labs and imaging studies   CBC: Recent Labs  Lab 10/31/19 1451 11/01/19 0335  WBC 7.7 7.1  NEUTROABS 6.0  --   HGB 11.5* 9.4*  HCT 36.3 29.5*  MCV 108.7* 107.7*  PLT 210 235    Basic Metabolic Panel: Recent Labs  Lab 10/31/19 1451 11/01/19 0058 11/01/19 0335  NA 140 140 140  K 5.9* 5.5* 5.1  CL 93* 95* 95*  CO2 32 27 30  GLUCOSE 112* 89 90  BUN 72* 77* 80*  CREATININE 12.90* 12.72* 12.90*  CALCIUM 9.7 9.1 9.4  MG  --   --  2.8*  PHOS  --   --  7.3*    Liver Function Tests: Recent Labs  Lab 10/31/19 1451 11/01/19 0335  AST 20 17  ALT 16 14  ALKPHOS 99 91  BILITOT 0.5 0.3  PROT 6.8 5.7*  ALBUMIN 3.8 3.2*    CBG: Recent Labs  Lab 11/01/19 0453 11/01/19 0807 11/01/19 1141  GLUCAP 102* 92 85    Microbiology Studies:   Recent Results (from the past 240 hour(s))   Respiratory Panel by RT PCR (Flu A&B, Covid) - Nasopharyngeal Swab     Status: None   Collection Time: 10/31/19  8:55 PM   Specimen: Nasopharyngeal Swab  Result Value Ref Range Status   SARS Coronavirus 2 by RT PCR NEGATIVE NEGATIVE Final    Comment: (NOTE) SARS-CoV-2 target nucleic acids are NOT DETECTED. The SARS-CoV-2 RNA is generally detectable in upper respiratoy specimens during the acute phase of infection. The lowest concentration of SARS-CoV-2 viral copies this assay can detect is 131 copies/mL. A negative result does not preclude SARS-Cov-2 infection and should not be used as the sole basis for treatment or other patient management decisions. A negative result may occur with  improper specimen collection/handling, submission of specimen other than nasopharyngeal swab, presence of viral mutation(s) within the areas targeted by this assay, and inadequate number of viral copies (<131 copies/mL). A negative result must be combined with clinical observations, patient history, and epidemiological information. The expected result is Negative. Fact Sheet for Patients:  PinkCheek.be Fact Sheet for Healthcare Providers:  GravelBags.it This test is not yet ap proved or cleared by the [place] FDA and  has been authorized for detection and/or diagnosis of SARS-CoV-2 by FDA under an Emergency Use Authorization (EUA). This EUA will remain  in effect (meaning this test can be used) for the duration of the COVID-19 declaration under Section 564(b)(1) of the Act, 21 U.S.C. section 360bbb-3(b)(1), unless the authorization is terminated or revoked sooner.    Influenza A by PCR NEGATIVE NEGATIVE Final   Influenza B by PCR NEGATIVE NEGATIVE Final    Comment: (NOTE) The Xpert Xpress SARS-CoV-2/FLU/RSV assay is intended as an aid in  the diagnosis of influenza from Nasopharyngeal swab specimens and  should not be used as a sole  basis for treatment. Nasal washings and  aspirates are unacceptable for Xpert Xpress SARS-CoV-2/FLU/RSV  testing. Fact Sheet for Patients: PinkCheek.be Fact Sheet for Healthcare Providers: GravelBags.it This test is not yet approved or cleared by the [place] FDA and  has been authorized for detection and/or diagnosis of SARS-CoV-2 by  FDA under an Emergency Use Authorization (EUA). This EUA will remain  in effect (meaning this test can be used) for the duration of the  Covid-19 declaration under Section 564(b)(1) of the Act, 21  U.S.C. section 360bbb-3(b)(1), unless the authorization is  terminated or revoked. Performed at [facility], [place] [address], [place], [place] [address]      Radiology Studies:  CARDIAC CATHETERIZATION  Result Date: 11/01/2019  Prox RCA to Mid RCA lesion is 30% stenosed.  Prox Cx to Mid Cx lesion is 20% stenosed.  Mid Cx lesion is 50% stenosed.  Colon Flattery  Cx to Prox Cx lesion is 40% stenosed.  Ost LAD to Mid LAD lesion is 30% stenosed.  1st Sept lesion is 60% stenosed.  There is moderate left ventricular systolic dysfunction.  LV end diastolic pressure is moderately elevated.  The left ventricular ejection fraction is 35-45% by visual estimate.  There is mild (2+) mitral regurgitation.  1. Diffuse, mild non-obstructive disease in the LAD 2. Patent stent in the mid Circumflex with minimal restenosis. Moderate stenosis just beyond the stented segment of the mid Circumflex artery. This does not appear to be flow limiting. 3. Mild non-obstructive disease in the mid RCA 4. Moderate, global LV systolic dysfunction. 5. Elevated filling pressures Recommendations; Medical management of CAD. Explore other causes of chest pain.   DG Chest Portable 1 View  Result Date: 10/31/2019 CLINICAL DATA:  Chest pain and shortness of breath EXAM: PORTABLE CHEST 1 VIEW COMPARISON:  October 19, 2019 FINDINGS: There  is slight scarring in the bases. There is no edema or consolidation. Heart is upper normal in size with pulmonary vascularity normal. No adenopathy. There is aortic atherosclerosis. There are foci of calcification in each subclavian and axillary artery. No bone lesions. IMPRESSION: Mild bibasilar scarring. No edema or consolidation. Stable cardiac silhouette. No adenopathy. There is extensive arterial vascular calcification. Aortic Atherosclerosis (ICD10-I70.0). Electronically Signed   By: Lowella Grip III M.D.   On: 10/31/2019 15:59     Scheduled Meds:   . [START ON 11/02/2019] aspirin EC  81 mg Oral Daily  . atorvastatin  40 mg Oral Daily  . buPROPion  150 mg Oral BID  . Chlorhexidine Gluconate Cloth  6 each Topical Q0600  . clopidogrel  75 mg Oral Daily  . darbepoetin (ARANESP) injection - DIALYSIS  200 mcg Intravenous Q Wed-HD  . doxercalciferol  1 mcg Intravenous Q M,W,F-HD  . ferric citrate  630 mg Oral TID WC  . insulin aspart  0-6 Units Subcutaneous Q4H  . metoprolol succinate  50 mg Oral Daily  . mometasone-formoterol  2 puff Inhalation BID  . multivitamin  1 tablet Oral QHS  . QUEtiapine  200 mg Oral QHS  . sodium chloride flush  3 mL Intravenous Q12H  . sodium chloride flush  3 mL Intravenous Q12H  . sodium chloride flush  3 mL Intravenous Q12H    Continuous Infusions:   . sodium chloride    . sodium chloride    . sodium chloride    . sodium chloride    . ferric gluconate (FERRLECIT/NULECIT) IV       LOS: 1 day     Vernell Leep, MD, [place], [facility]. [facility]    To contact the attending provider between 7A-7P or the covering provider during after hours 7P-7A, please log into the web site [email or web address] and access using universal [place] password for that web site. If you do not have the password, please call the hospital operator.  11/01/2019, 2:23 PM

## 2019-11-01 NOTE — Progress Notes (Addendum)
The patient has been seen in conjunction with Reino Bellis, NP. All aspects of care have been considered and discussed. The patient has been personally interviewed, examined, and all clinical data has been reviewed.   History is very confusing.  Has history of CAD with prior stents.  Has high risk score and comorbidities for CAD and events.  Plan coronary angiography to define anatomy and help guide therapy.  It is possible that chest pain symptoms were noncardiac although the quality of history is very poor.  She seems to have some cognitive impairment, as she does not remember seeing me in the emergency room last evening.   Progress Note  Patient Name: Kari Robinson Date of Encounter: 11/01/2019  Primary Cardiologist: Minus Breeding, MD    Subjective   No chest pain overnight. Had questions about cath this morning. Now agreeable to sign consent.   Inpatient Medications    Scheduled Meds: . [START ON 11/02/2019] aspirin EC  81 mg Oral Daily  . atorvastatin  40 mg Oral Daily  . buPROPion  150 mg Oral BID  . Chlorhexidine Gluconate Cloth  6 each Topical Q0600  . clopidogrel  75 mg Oral Daily  . ferric citrate  630 mg Oral TID WC  . insulin aspart  0-6 Units Subcutaneous Q4H  . metoprolol succinate  50 mg Oral Daily  . mometasone-formoterol  2 puff Inhalation BID  . QUEtiapine  200 mg Oral QHS  . sodium chloride flush  3 mL Intravenous Q12H  . sodium chloride flush  3 mL Intravenous Q12H   Continuous Infusions: . sodium chloride    . sodium chloride    . sodium chloride    . sodium chloride    . sodium chloride     PRN Meds: sodium chloride, sodium chloride, sodium chloride, sodium chloride, acetaminophen **OR** acetaminophen, albuterol, alteplase, ferric citrate, heparin, HYDROcodone-acetaminophen, ipratropium-albuterol, lidocaine (PF), lidocaine-prilocaine, ondansetron **OR** ondansetron (ZOFRAN) IV, pentafluoroprop-tetrafluoroeth, sodium chloride flush, sodium  chloride flush   Vital Signs    Vitals:   11/01/19 0100 11/01/19 0239 11/01/19 0455 11/01/19 0542  BP: (!) 161/92 (!) 155/96 (!) 163/90 (!) 158/99  Pulse: 97 (!) 103 (!) 108 (!) 104  Resp: 17 17 17 17   Temp:  98.1 F (36.7 C) 98.1 F (36.7 C)   TempSrc:  Oral Oral   SpO2: 99% 100% 95% 97%  Weight:  75.3 kg    Height:  5\' 4"  (1.626 m)      Intake/Output Summary (Last 24 hours) at 11/01/2019 0840 Last data filed at 11/01/2019 0300 Gross per 24 hour  Intake 50 ml  Output --  Net 50 ml   Last 3 Weights 11/01/2019 10/24/2019 10/23/2019  Weight (lbs) 166 lb 0.1 oz 163 lb 2.3 oz 157 lb 6.5 oz  Weight (kg) 75.3 kg 74 kg 71.4 kg  Some encounter information is confidential and restricted. Go to Review Flowsheets activity to see all data.      Telemetry    SR - Personally Reviewed  ECG    SR with TWI in inferolateral leads - Personally Reviewed  Physical Exam  Older WF, sitting in bed.  GEN: No acute distress.   Neck: No JVD Cardiac: RRR, no murmurs, rubs, or gallops.  Respiratory: Clear to auscultation bilaterally. GI: Soft, nontender, non-distended  MS: No edema; No deformity. Neuro:  Nonfocal  Psych: Normal affect   Labs    High Sensitivity Troponin:   Recent Labs  Lab 10/19/19 0225 10/19/19 0425 10/31/19  1451 10/31/19 1803 11/01/19 0052  TROPONINIHS 80* 96* 83* 91* 95*  104*      Chemistry Recent Labs  Lab 10/31/19 1451 11/01/19 0058 11/01/19 0335  NA 140 140 140  K 5.9* 5.5* 5.1  CL 93* 95* 95*  CO2 32 27 30  GLUCOSE 112* 89 90  BUN 72* 77* 80*  CREATININE 12.90* 12.72* 12.90*  CALCIUM 9.7 9.1 9.4  PROT 6.8  --  5.7*  ALBUMIN 3.8  --  3.2*  AST 20  --  17  ALT 16  --  14  ALKPHOS 99  --  91  BILITOT 0.5  --  0.3  GFRNONAA 3* 3* 3*  GFRAA 3* 3* 3*  ANIONGAP 15 18* 15     Hematology Recent Labs  Lab 10/31/19 1451 11/01/19 0335  WBC 7.7 7.1  RBC 3.34* 2.74*  HGB 11.5* 9.4*  HCT 36.3 29.5*  MCV 108.7* 107.7*  MCH 34.4* 34.3*   MCHC 31.7 31.9  RDW 15.6* 15.6*  PLT 210 178    BNP Recent Labs  Lab 10/31/19 1514  BNP 3,389.6*     DDimer No results for input(s): DDIMER in the last 168 hours.   Radiology    DG Chest Portable 1 View  Result Date: 10/31/2019 CLINICAL DATA:  Chest pain and shortness of breath EXAM: PORTABLE CHEST 1 VIEW COMPARISON:  October 19, 2019 FINDINGS: There is slight scarring in the bases. There is no edema or consolidation. Heart is upper normal in size with pulmonary vascularity normal. No adenopathy. There is aortic atherosclerosis. There are foci of calcification in each subclavian and axillary artery. No bone lesions. IMPRESSION: Mild bibasilar scarring. No edema or consolidation. Stable cardiac silhouette. No adenopathy. There is extensive arterial vascular calcification. Aortic Atherosclerosis (ICD10-I70.0). Electronically Signed   By: Lowella Grip III M.D.   On: 10/31/2019 15:59    Cardiac Studies   N/a   Patient Profile     68 y.o. female  with a hx of CADw/ stents in [place] 2005 & 2016, ICM, COPD,lung CA s/p L lung resection 2017,ERSD on HD, PAF, tob use (quit 07/2019), NSTEMI 09/2017 w/ med rx(most meds not filled), S-CHF w/ EF25% 01/2018improved to 45-50%06/2019,who was seen for the evaluation ofchest pain at the request of Dr. Billy Fischer.  Assessment & Plan    1. Chest pain with Hx of CAD: HsT peaked at 104, had relief of chest pain with SL nitro. Planned for cardiac cath today. She is now willing to sign consent. All questions answered. -- The patient understands that risks included but are not limited to stroke (1 in 1000), death (1 in 27), kidney failure [usually temporary] (1 in 500), bleeding (1 in 200), allergic reaction [possibly serious] (1 in 200).  -- continue on ASA, plavix, statin, BB  2. ESRD on HD: planned for HD today. Will plan for cath prior to HD if possible. Nephrology following.   3. Chronic systolic HF: EF has been as low as 20% in the  past. Improved to 40-45% on echo back in 06/2019. No signs of volume overload on exam. Management via HD  4. Anemia of chronic disease: baseline around 9-10. No reported issues with bleeding.  5. Hyperkalemia: resolved with HD, K+ 5.1 this morning.  For questions or updates, please contact [place] Please consult [email or web address] for contact info under        Signed, Reino Bellis, NP  11/01/2019, 8:40 AM

## 2019-11-01 NOTE — Progress Notes (Signed)
Called to patient's bedside for difficulty breathing.  PRN Albuterol administered.  Patient complaining of inability to breathe and is a respiratory distress.  Bipap initiated at 16/6 40%.  Dyspnea resoolved.  HR 99, Saturation 97% and RR 26

## 2019-11-01 NOTE — H&P (View-Only) (Signed)
The patient has been seen in conjunction with Reino Bellis, NP. All aspects of care have been considered and discussed. The patient has been personally interviewed, examined, and all clinical data has been reviewed.   History is very confusing.  Has history of CAD with prior stents.  Has high risk score and comorbidities 40 CAD and events.  Plan coronary angiography to define anatomy and help guide therapy.  It is possible that chest pain symptoms were noncardiac although the quality of history is very poor.  She seems to have some cognitive impairment, as she does not remember seeing me in the emergency room last evening.   Progress Note  Patient Name: Kari Robinson Date of Encounter: 11/01/2019  Primary Cardiologist: Minus Breeding, MD    Subjective   No chest pain overnight. Had questions about cath this morning. Now agreeable to sign consent.   Inpatient Medications    Scheduled Meds: . [START ON 11/02/2019] aspirin EC  81 mg Oral Daily  . atorvastatin  40 mg Oral Daily  . buPROPion  150 mg Oral BID  . Chlorhexidine Gluconate Cloth  6 each Topical Q0600  . clopidogrel  75 mg Oral Daily  . ferric citrate  630 mg Oral TID WC  . insulin aspart  0-6 Units Subcutaneous Q4H  . metoprolol succinate  50 mg Oral Daily  . mometasone-formoterol  2 puff Inhalation BID  . QUEtiapine  200 mg Oral QHS  . sodium chloride flush  3 mL Intravenous Q12H  . sodium chloride flush  3 mL Intravenous Q12H   Continuous Infusions: . sodium chloride    . sodium chloride    . sodium chloride    . sodium chloride    . sodium chloride     PRN Meds: sodium chloride, sodium chloride, sodium chloride, sodium chloride, acetaminophen **OR** acetaminophen, albuterol, alteplase, ferric citrate, heparin, HYDROcodone-acetaminophen, ipratropium-albuterol, lidocaine (PF), lidocaine-prilocaine, ondansetron **OR** ondansetron (ZOFRAN) IV, pentafluoroprop-tetrafluoroeth, sodium chloride flush, sodium  chloride flush   Vital Signs    Vitals:   11/01/19 0100 11/01/19 0239 11/01/19 0455 11/01/19 0542  BP: (!) 161/92 (!) 155/96 (!) 163/90 (!) 158/99  Pulse: 97 (!) 103 (!) 108 (!) 104  Resp: 17 17 17 17   Temp:  98.1 F (36.7 C) 98.1 F (36.7 C)   TempSrc:  Oral Oral   SpO2: 99% 100% 95% 97%  Weight:  75.3 kg    Height:  5\' 4"  (1.626 m)      Intake/Output Summary (Last 24 hours) at 11/01/2019 0840 Last data filed at 11/01/2019 0300 Gross per 24 hour  Intake 50 ml  Output --  Net 50 ml   Last 3 Weights 11/01/2019 10/24/2019 10/23/2019  Weight (lbs) 166 lb 0.1 oz 163 lb 2.3 oz 157 lb 6.5 oz  Weight (kg) 75.3 kg 74 kg 71.4 kg  Some encounter information is confidential and restricted. Go to Review Flowsheets activity to see all data.      Telemetry    SR - Personally Reviewed  ECG    SR with TWI in inferolateral leads - Personally Reviewed  Physical Exam  Older WF, sitting in bed.  GEN: No acute distress.   Neck: No JVD Cardiac: RRR, no murmurs, rubs, or gallops.  Respiratory: Clear to auscultation bilaterally. GI: Soft, nontender, non-distended  MS: No edema; No deformity. Neuro:  Nonfocal  Psych: Normal affect   Labs    High Sensitivity Troponin:   Recent Labs  Lab 10/19/19 0225 10/19/19 0425 10/31/19  1451 10/31/19 1803 11/01/19 0052  TROPONINIHS 80* 96* 83* 91* 95*  104*      Chemistry Recent Labs  Lab 10/31/19 1451 11/01/19 0058 11/01/19 0335  NA 140 140 140  K 5.9* 5.5* 5.1  CL 93* 95* 95*  CO2 32 27 30  GLUCOSE 112* 89 90  BUN 72* 77* 80*  CREATININE 12.90* 12.72* 12.90*  CALCIUM 9.7 9.1 9.4  PROT 6.8  --  5.7*  ALBUMIN 3.8  --  3.2*  AST 20  --  17  ALT 16  --  14  ALKPHOS 99  --  91  BILITOT 0.5  --  0.3  GFRNONAA 3* 3* 3*  GFRAA 3* 3* 3*  ANIONGAP 15 18* 15     Hematology Recent Labs  Lab 10/31/19 1451 11/01/19 0335  WBC 7.7 7.1  RBC 3.34* 2.74*  HGB 11.5* 9.4*  HCT 36.3 29.5*  MCV 108.7* 107.7*  MCH 34.4* 34.3*   MCHC 31.7 31.9  RDW 15.6* 15.6*  PLT 210 178    BNP Recent Labs  Lab 10/31/19 1514  BNP 3,389.6*     DDimer No results for input(s): DDIMER in the last 168 hours.   Radiology    DG Chest Portable 1 View  Result Date: 10/31/2019 CLINICAL DATA:  Chest pain and shortness of breath EXAM: PORTABLE CHEST 1 VIEW COMPARISON:  October 19, 2019 FINDINGS: There is slight scarring in the bases. There is no edema or consolidation. Heart is upper normal in size with pulmonary vascularity normal. No adenopathy. There is aortic atherosclerosis. There are foci of calcification in each subclavian and axillary artery. No bone lesions. IMPRESSION: Mild bibasilar scarring. No edema or consolidation. Stable cardiac silhouette. No adenopathy. There is extensive arterial vascular calcification. Aortic Atherosclerosis (ICD10-I70.0). Electronically Signed   By: Lowella Grip III M.D.   On: 10/31/2019 15:59    Cardiac Studies   N/a   Patient Profile     68 y.o. female  with a hx of CADw/ stents in [place] 2005 & 2016, ICM, COPD,lung CA s/p L lung resection 2017,ERSD on HD, PAF, tob use (quit 07/2019), NSTEMI 09/2017 w/ med rx(most meds not filled), S-CHF w/ EF25% 01/2018improved to 45-50%06/2019,who was seen for the evaluation ofchest pain at the request of Dr. Billy Fischer.  Assessment & Plan    1. Chest pain with Hx of CAD: HsT peaked at 104, had relief of chest pain with SL nitro. Planned for cardiac cath today. She is now willing to sign consent. All questions answered. -- The patient understands that risks included but are not limited to stroke (1 in 1000), death (1 in 52), kidney failure [usually temporary] (1 in 500), bleeding (1 in 200), allergic reaction [possibly serious] (1 in 200).  -- continue on ASA, plavix, statin, BB  2. ESRD on HD: planned for HD today. Will plan for cath prior to HD if possible. Nephrology following.   3. Chronic systolic HF: EF has been as low as 20% in the  past. Improved to 40-45% on echo back in 06/2019. No signs of volume overload on exam. Management via HD  4. Anemia of chronic disease: baseline around 9-10. No reported issues with bleeding.  5. Hyperkalemia: resolved with HD, K+ 5.1 this morning.  For questions or updates, please contact [place] Please consult [email or web address] for contact info under        Signed, Reino Bellis, NP  11/01/2019, 8:40 AM

## 2019-11-01 NOTE — Significant Event (Signed)
Rapid Response Event Note  Overview: Time Called: 1537 Arrival Time: 9432   Called for patient having new onset dyspnea and increased oxygen requirements.  Initial Focused Assessment: Patient lying in bed on 15L NRB, oxygen saturation 97%. Patient appears anxious and is quickly trying to tell me how she is feeling and what has worked treatment wise in the past when similar symptoms have occurred. MD walked in shortly after me to assess the patient.  Interventions: Dr. Algis Liming at bedside to assess patient. MD ordered: PRN BiPAP, CXR, IV solu-medrol, and adjusted breathing treatments  Patient also planned to have HD today, she missed HD on Monday.  Plan of Care (if not transferred): MD feels this is likely COPD exacerbation potentially complicated by fluid overload regarding ESRD. MD requests close monitoring at this time. Primary RN to call RRRN for further needs.   Event Summary:  Kari Robinson

## 2019-11-01 NOTE — Evaluation (Signed)
Physical Therapy Evaluation Patient Details Name: Kari Robinson MRN: [id number] DOB: 08-02-1951 Today's Date: 11/01/2019   History of Present Illness  Harolyn Cocker is a 68 y.o. female with medical history significant of hypertension, hyperlipidemia, ESRD on HD(M/W/F), CAD s/p stents, COPD, oxygen dependent on 3 L of nasal cannula oxygen, tobacco abuse, and lung cancer s/p resection.  She presents with complaints chest pain and shortness of breath.  Also reporting she has been having intermittent leg jerking.  Clinical Impression  Pt was seen for mobility of transfers and to get OOB, with notable improvement from AM on sitting balance.  Her struggle with fear to move has made it necessary to have 2 person assist to do bed to chair transfers, and will work on controlling both sitting and standing balance for transfer to safer gait and standing endurance.  Pt is able to follow instructions so will continue to make all effort to get pt moving for CIR and to reduce her fall risk.      Follow Up Recommendations CIR    Equipment Recommendations  None recommended by PT    Recommendations for Other Services Rehab consult     Precautions / Restrictions Precautions Precautions: Fall Precaution Comments: fell at home, worsening leg tone Restrictions Weight Bearing Restrictions: No      Mobility  Bed Mobility Overal bed mobility: Needs Assistance Bed Mobility: Supine to Sit;Sit to Supine;Rolling Rolling: Mod assist   Supine to sit: Mod assist Sit to supine: Mod assist   General bed mobility comments: tactile and verbal prompts for flexing RLE to roll L and noted tremors were gone with rest, but speech affected ongoing  Transfers Overall transfer level: Needs assistance Equipment used: 1 person hand held assist Transfers: Sit to/from [employer] Transfers Sit to Stand: Mod assist Stand pivot transfers: Total assist;From elevated surface       General transfer comment: pt  began to resist PT during pivot to [employer] so had to stop the transfer to use bedpan  Ambulation/Gait             General Gait Details: unable  Stairs            Wheelchair Mobility    Modified Rankin (Stroke Patients Only)       Balance Overall balance assessment: Needs assistance Sitting-balance support: Feet supported;Bilateral upper extremity supported Sitting balance-Leahy Scale: Fair     Standing balance support: Bilateral upper extremity supported;During functional activity Standing balance-Leahy Scale: Poor Standing balance comment: poor balance but would be better if pt did not resist PT trying to move her, definitely fearful                             Pertinent Vitals/Pain Pain Assessment: No/denies pain Pain Intervention(s): Monitored during session    [place] expects to be discharged to:: Private residence Living Arrangements: Spouse/significant other Available Help at Discharge: Friend(s);Available 24 hours/day Type of Home: Apartment Home Access: Level entry     Home Layout: One level Home Equipment: Clinical cytogeneticist - 4 wheels;Grab bars - tub/shower;Toilet riser Additional Comments: reports boyfriend quit his job to take care of her    Prior Function Level of Independence: Needs assistance   Gait / Transfers Assistance Needed: uses rollator or cane, takes transportation to dialysis  ADL's / Homemaking Assistance Needed: pt reports boyfriend assists with donning socks/ shoes, getting in/out of shower        Hand  Dominance   Dominant Hand: Right    Extremity/Trunk Assessment   Upper Extremity Assessment Upper Extremity Assessment: Defer to OT evaluation    Lower Extremity Assessment Lower Extremity Assessment: Generalized weakness(tremors of LE's with any intention) RLE Deficits / Details: pt has oscillating tremors of LE's which worsen with standing and fearful feelings as she sustained falls at  home RLE Coordination: decreased gross motor;decreased fine motor LLE Deficits / Details: pt has oscillating tremors of LE's which worsen with standing and fearful feelings as she sustained falls at home LLE Coordination: decreased gross motor;decreased fine motor    Cervical / Trunk Assessment Cervical / Trunk Assessment: Normal  Communication   Communication: Expressive difficulties;Other (comment)(speech is altered by her tremors apparently)  Cognition Arousal/Alertness: Awake/alert Behavior During Therapy: WFL for tasks assessed/performed Overall Cognitive Status: No family/caregiver present to determine baseline cognitive functioning                                 General Comments: pt is struggling with details of home but may be her expressive issues      General Comments General comments (skin integrity, edema, etc.): unable to monitor O2 sats to move when pt began to resist and pulled off her O2 sat lead    Exercises     Assessment/Plan    PT Assessment Patient needs continued PT services  PT Problem List Decreased strength;Decreased range of motion;Decreased activity tolerance;Decreased balance;Decreased mobility;Decreased coordination;Decreased knowledge of use of DME;Decreased safety awareness;Cardiopulmonary status limiting activity;Decreased skin integrity;Pain       PT Treatment Interventions DME instruction;Functional mobility training;Balance training;Patient/family education;Therapeutic activities;Gait training;Therapeutic exercise    PT Goals (Current goals can be found in the Care Plan section)  Acute Rehab PT Goals Patient Stated Goal: to get home safely PT Goal Formulation: With patient Time For Goal Achievement: 11/15/19 Potential to Achieve Goals: Good    Frequency Min 3X/week   Barriers to discharge Inaccessible home environment;Decreased caregiver support pt is demonstrating ataxia and is resisting effort to move her in standing     Co-evaluation               AM-PAC PT "6 Clicks" Mobility  Outcome Measure Help needed turning from your back to your side while in a flat bed without using bedrails?: A Little Help needed moving from lying on your back to sitting on the side of a flat bed without using bedrails?: A Lot Help needed moving to and from a bed to a chair (including a wheelchair)?: A Lot Help needed standing up from a chair using your arms (e.g., wheelchair or bedside chair)?: A Lot Help needed to walk in hospital room?: Total Help needed climbing 3-5 steps with a railing? : Total 6 Click Score: 11    End of Session Equipment Utilized During Treatment: Gait belt;Oxygen Activity Tolerance: Other (comment) Patient left: in bed;with call bell/phone within reach;with bed alarm set Nurse Communication: Mobility status PT Visit Diagnosis: Unsteadiness on feet (R26.81);Other abnormalities of gait and mobility (R26.89);Muscle weakness (generalized) (M62.81);Repeated falls (R29.6);Ataxic gait (R26.0);Difficulty in walking, not elsewhere classified (R26.2)    Time: 6761-9509 PT Time Calculation (min) (ACUTE ONLY): 27 min   Charges:   PT Evaluation $PT Eval Moderate Complexity: 1 Mod PT Treatments $Therapeutic Activity: 8-22 mins       Ramond Dial 11/01/2019, 2:26 PM   Mee Hives, PT MS Acute Rehab Dept. Number: [place] [facility] [place]

## 2019-11-01 NOTE — Progress Notes (Signed)
Pt has not voided since she has been on the floor. Pt stated she had voided once since she came to hospital. Bladder san showed 205 ml. Pt does not feel urge to void. No discomfort. Will continue to monitor.

## 2019-11-01 NOTE — Progress Notes (Signed)
Patient returned from cath lab to 4E room 1.  Telemetry monitor replaced and CCMD notified.  Left groin site level 0.  Cycling frequent vitals.  Will continue to monitor.

## 2019-11-01 NOTE — Progress Notes (Signed)
[facility] Progress Note   Dialysis Orders: MWF at [facility] 3:30hr, 350/800, EDW 72.5kg, 2K/2Ca, UFP 4, AVF, no heparin - Hectoral 17mcg IV q HD - Venofer 50mg  IV q week - MIrcera 113mcg IV q 2 weeks (last 11/30)  Assessment/Plan: 1. CP/SOB - neg cath per nursing 2. ESRD -MWF K 5.1 - HD today - missed Monday 3. Anemia - hgb 9.4 - continue ESA/Fe - due for redose ESA 4. Secondary hyperparathyroidism - P high - missing binders/continue hectorol 5. HTN/volume - needs volume down - net UF for 2.5 L today 6. Nutrition - renal diet/vits 7. Severe COPD - chronic O2, likely contributory to SOB 8.  Deconditioning./falls - ongoing issue - SNF recommended last admission - d/w in [facility]  But still with falls Myriam Jacobson, PA-C [place] [phone] 11/01/2019,11:12 AM  LOS: 1 day   Subjective:   Breathing easily no CP no SOB  Objective Vitals:   11/01/19 1024 11/01/19 1029 11/01/19 1058 11/01/19 1100  BP: (!) 148/89 (!) 150/90 139/90 139/90  Pulse: 84 82 82 82  Resp: 14 16 18 18   Temp:      TempSrc:      SpO2: 98% 98% 100% 99%  Weight:      Height:       Physical Exam General: NAD resting comfortably post cath - on O2 - uses chroncially Heart: RRR Lungs: breathing easily but coarse bilateral crackels Abdomen: soft Extremities:no edema Dialysis Access: right lower AVF + bruit -    Additional Objective Labs: Basic Metabolic Panel: Recent Labs  Lab 10/31/19 1451 11/01/19 0058 11/01/19 0335  NA 140 140 140  K 5.9* 5.5* 5.1  CL 93* 95* 95*  CO2 32 27 30  GLUCOSE 112* 89 90  BUN 72* 77* 80*  CREATININE 12.90* 12.72* 12.90*  CALCIUM 9.7 9.1 9.4  PHOS  --   --  7.3*   Liver Function Tests: Recent Labs  Lab 10/31/19 1451 11/01/19 0335  AST 20 17  ALT 16 14  ALKPHOS 99 91  BILITOT 0.5 0.3  PROT 6.8 5.7*  ALBUMIN 3.8 3.2*   No results for input(s): LIPASE, AMYLASE in the last 168 hours. CBC: Recent Labs  Lab 10/31/19 1451  11/01/19 0335  WBC 7.7 7.1  NEUTROABS 6.0  --   HGB 11.5* 9.4*  HCT 36.3 29.5*  MCV 108.7* 107.7*  PLT 210 178   Blood Culture No results found for: SDES, SPECREQUEST, CULT, REPTSTATUS  Cardiac Enzymes: No results for input(s): CKTOTAL, CKMB, CKMBINDEX, TROPONINI in the last 168 hours. CBG: Recent Labs  Lab 11/01/19 0103 11/01/19 0453 11/01/19 0807  GLUCAP 94 102* 92   Iron Studies: No results for input(s): IRON, TIBC, TRANSFERRIN, FERRITIN in the last 72 hours. Lab Results  Component Value Date   INR 1.1 10/18/2019   INR 1.1 07/23/2019   INR 1.11 06/06/2018   Studies/Results: CARDIAC CATHETERIZATION  Result Date: 11/01/2019  Prox RCA to Mid RCA lesion is 30% stenosed.  Prox Cx to Mid Cx lesion is 20% stenosed.  Mid Cx lesion is 50% stenosed.  Ost Cx to Prox Cx lesion is 40% stenosed.  Ost LAD to Mid LAD lesion is 30% stenosed.  1st Sept lesion is 60% stenosed.  There is moderate left ventricular systolic dysfunction.  LV end diastolic pressure is moderately elevated.  The left ventricular ejection fraction is 35-45% by visual estimate.  There is mild (2+) mitral regurgitation.  1. Diffuse, mild non-obstructive disease in  the LAD 2. Patent stent in the mid Circumflex with minimal restenosis. Moderate stenosis just beyond the stented segment of the mid Circumflex artery. This does not appear to be flow limiting. 3. Mild non-obstructive disease in the mid RCA 4. Moderate, global LV systolic dysfunction. 5. Elevated filling pressures Recommendations; Medical management of CAD. Explore other causes of chest pain.   DG Chest Portable 1 View  Result Date: 10/31/2019 CLINICAL DATA:  Chest pain and shortness of breath EXAM: PORTABLE CHEST 1 VIEW COMPARISON:  October 19, 2019 FINDINGS: There is slight scarring in the bases. There is no edema or consolidation. Heart is upper normal in size with pulmonary vascularity normal. No adenopathy. There is aortic atherosclerosis. There  are foci of calcification in each subclavian and axillary artery. No bone lesions. IMPRESSION: Mild bibasilar scarring. No edema or consolidation. Stable cardiac silhouette. No adenopathy. There is extensive arterial vascular calcification. Aortic Atherosclerosis (ICD10-I70.0). Electronically Signed   By: Lowella Grip III M.D.   On: 10/31/2019 15:59   Medications: . sodium chloride    . sodium chloride    . sodium chloride    . sodium chloride     . [START ON 11/02/2019] aspirin EC  81 mg Oral Daily  . atorvastatin  40 mg Oral Daily  . buPROPion  150 mg Oral BID  . Chlorhexidine Gluconate Cloth  6 each Topical Q0600  . clopidogrel  75 mg Oral Daily  . ferric citrate  630 mg Oral TID WC  . insulin aspart  0-6 Units Subcutaneous Q4H  . metoprolol succinate  50 mg Oral Daily  . mometasone-formoterol  2 puff Inhalation BID  . QUEtiapine  200 mg Oral QHS  . sodium chloride flush  3 mL Intravenous Q12H  . sodium chloride flush  3 mL Intravenous Q12H  . sodium chloride flush  3 mL Intravenous Q12H

## 2019-11-01 NOTE — Progress Notes (Signed)
Pt is going to cath lab around 0800 per Cath lab RN Jerelyn Scott o'Neal . Pt stated nobody has talked to her about cath and she would like to talk to somebody before signing the consent. Anderson Malta made aware. Will continue to monitor.

## 2019-11-01 NOTE — Progress Notes (Signed)
Pt received from ED via stretcher, AO x4. Denies any chest pain. Breathing even and unlabored in 3l o2 via [place]. Vitals stable at the time of arrival. Chronic o2 user at home (3 l). Right forearm AV fistula intact, thrill and bruit present. CHG bath completed, tele applied, CCMD notified. Pt has history of multiple falls at home. Bilateral legs and arms jerks , has tremor. Pt stated  " my legs are like jello, I cant stand". Slurred speech since 2 months per patient. oriented pt to room and call bell system. Call bell within reach. Bed alarm on, will continue to monitor.

## 2019-11-01 NOTE — Plan of Care (Signed)
Poc progressing.  

## 2019-11-02 DIAGNOSIS — J9621 Acute and chronic respiratory failure with hypoxia: Secondary | ICD-10-CM

## 2019-11-02 DIAGNOSIS — E875 Hyperkalemia: Secondary | ICD-10-CM

## 2019-11-02 DIAGNOSIS — J441 Chronic obstructive pulmonary disease with (acute) exacerbation: Secondary | ICD-10-CM

## 2019-11-02 LAB — GLUCOSE, CAPILLARY
Glucose-Capillary: 104 mg/dL — ABNORMAL HIGH (ref 70–99)
Glucose-Capillary: 110 mg/dL — ABNORMAL HIGH (ref 70–99)
Glucose-Capillary: 157 mg/dL — ABNORMAL HIGH (ref 70–99)
Glucose-Capillary: 175 mg/dL — ABNORMAL HIGH (ref 70–99)
Glucose-Capillary: 88 mg/dL (ref 70–99)

## 2019-11-02 LAB — BASIC METABOLIC PANEL
Anion gap: 21 — ABNORMAL HIGH (ref 5–15)
BUN: 95 mg/dL — ABNORMAL HIGH (ref 8–23)
CO2: 22 mmol/L (ref 22–32)
Calcium: 9.7 mg/dL (ref 8.9–10.3)
Chloride: 96 mmol/L — ABNORMAL LOW (ref 98–111)
Creatinine, Ser: 14.56 mg/dL — ABNORMAL HIGH (ref 0.44–1.00)
GFR calc Af Amer: 3 mL/min — ABNORMAL LOW (ref >60)
GFR calc non Af Amer: 2 mL/min — ABNORMAL LOW (ref >60)
Glucose, Bld: 103 mg/dL — ABNORMAL HIGH (ref 70–99)
Potassium: 6.6 mmol/L (ref 3.5–5.1)
Sodium: 139 mmol/L (ref 135–145)

## 2019-11-02 LAB — CBC
HCT: 31.4 % — ABNORMAL LOW (ref 36.0–46.0)
Hemoglobin: 10.2 g/dL — ABNORMAL LOW (ref 12.0–15.0)
MCH: 34.5 pg — ABNORMAL HIGH (ref 26.0–34.0)
MCHC: 32.5 g/dL (ref 30.0–36.0)
MCV: 106.1 fL — ABNORMAL HIGH (ref 80.0–100.0)
Platelets: 202 10*3/uL (ref 150–400)
RBC: 2.96 MIL/uL — ABNORMAL LOW (ref 3.87–5.11)
RDW: 15.3 % (ref 11.5–15.5)
WBC: 8 10*3/uL (ref 4.0–10.5)
nRBC: 0 % (ref 0.0–0.2)

## 2019-11-02 LAB — POTASSIUM: Potassium: 3.2 mmol/L — ABNORMAL LOW (ref 3.5–5.1)

## 2019-11-02 MED ORDER — IPRATROPIUM-ALBUTEROL 0.5-2.5 (3) MG/3ML IN SOLN
3.0000 mL | Freq: Three times a day (TID) | RESPIRATORY_TRACT | Status: DC
  Administered 2019-11-02 – 2019-11-06 (×11): 3 mL via RESPIRATORY_TRACT
  Filled 2019-11-02 (×11): qty 3

## 2019-11-02 MED ORDER — CHLORHEXIDINE GLUCONATE CLOTH 2 % EX PADS: 6.0000 | MEDICATED_PAD | Freq: Every day | CUTANEOUS | Status: DC

## 2019-11-02 MED ORDER — SODIUM POLYSTYRENE SULFONATE 15 GM/60ML PO SUSP
15.0000 g | Freq: Once | ORAL | Status: DC
  Filled 2019-11-02: qty 60

## 2019-11-02 NOTE — Progress Notes (Signed)
[facility] Progress Note   Dialysis Orders: MWF at [facility] 3:30hr, 350/800, EDW 72.5kg, 2K/2Ca, UFP 4, AVF, no heparin - Hectoral 35mcg IV q HD - Venofer 50mg  IV q week - MIrcera 157mcg IV q 2 weeks (last 11/30)  Assessment/Plan: 1. CP/SOB - cath with medical management recommended per 12/16 report  2. ESRD - HD today off schedule; then resume MWF schedule for HD; 20 minutes of 0 K bath for hyperkalemia 3. Anemia - acceptable; on ESA 4. Secondary hyperparathyroidism - P high - missing binders/continue hectorol 5. HTN/volume - UF with HD 6. Nutrition - renal diet/vits 7. Severe COPD - chronic O2, likely contributory to SOB 8.  Deconditioning./falls - ongoing issue - SNF recommended last admission - d/w in [facility]  But still with falls.  Would revisit     Subjective:   Seen and examined on HD.  Procedure supervised.  R AVF in use 107/84 and HR 66 on HD.  States breathing is better today - she felt better yesterday with her nebs.  Normally on 3 liters oxygen at home.  No n/v   Objective Vitals:   11/02/19 0635 11/02/19 0700 11/02/19 0730 11/02/19 0800  BP: (!) 150/87 (!) 151/86 (!) 144/91 (!) 134/111  Pulse: 94 90 90 84  Resp: 19 18 15 17   Temp:      TempSrc:      SpO2:      Weight:      Height:       Physical Exam General: elderly female in NAD at rest  Heart: RRR Lungs: reduced breath sounds unlabored; on supplemental oxygen 6 liters  Abdomen: soft/NT/ND Extremities:no pitting edema Psych normal mood and affect Dialysis Access: right lower AVF in place    Additional Objective Labs: Basic Metabolic Panel: Recent Labs  Lab 11/01/19 0058 11/01/19 0335 11/02/19 0309  NA 140 140 139  K 5.5* 5.1 6.6*  CL 95* 95* 96*  CO2 27 30 22   GLUCOSE 89 90 103*  BUN 77* 80* 95*  CREATININE 12.72* 12.90* 14.56*  CALCIUM 9.1 9.4 9.7  PHOS  --  7.3*  --    Liver Function Tests: Recent Labs  Lab 10/31/19 1451 11/01/19 0335  AST 20 17  ALT 16 14  ALKPHOS 99 91   BILITOT 0.5 0.3  PROT 6.8 5.7*  ALBUMIN 3.8 3.2*   CBC: Recent Labs  Lab 10/31/19 1451 11/01/19 0335 11/02/19 0309  WBC 7.7 7.1 8.0  NEUTROABS 6.0  --   --   HGB 11.5* 9.4* 10.2*  HCT 36.3 29.5* 31.4*  MCV 108.7* 107.7* 106.1*  PLT 210 178 202   Cardiac Enzymes: No results for input(s): CKTOTAL, CKMB, CKMBINDEX, TROPONINI in the last 168 hours. CBG: Recent Labs  Lab 11/01/19 1141 11/01/19 1534 11/01/19 1941 11/01/19 2326 11/02/19 0352  GLUCAP 85 116* 134* 130* 110*   Iron Studies: No results for input(s): IRON, TIBC, TRANSFERRIN, FERRITIN in the last 72 hours.  Studies/Results: CARDIAC CATHETERIZATION  Result Date: 11/01/2019  Prox RCA to Mid RCA lesion is 30% stenosed.  Prox Cx to Mid Cx lesion is 20% stenosed.  Mid Cx lesion is 50% stenosed.  Ost Cx to Prox Cx lesion is 40% stenosed.  Ost LAD to Mid LAD lesion is 30% stenosed.  1st Sept lesion is 60% stenosed.  There is moderate left ventricular systolic dysfunction.  LV end diastolic pressure is moderately elevated.  The left ventricular ejection fraction is 35-45% by visual estimate.  There is mild (2+)  mitral regurgitation.  1. Diffuse, mild non-obstructive disease in the LAD 2. Patent stent in the mid Circumflex with minimal restenosis. Moderate stenosis just beyond the stented segment of the mid Circumflex artery. This does not appear to be flow limiting. 3. Mild non-obstructive disease in the mid RCA 4. Moderate, global LV systolic dysfunction. 5. Elevated filling pressures Recommendations; Medical management of CAD. Explore other causes of chest pain.   DG Chest Port 1 View  Result Date: 11/01/2019 CLINICAL DATA:  Shortness of breath EXAM: PORTABLE CHEST 1 VIEW COMPARISON:  10/31/2019 FINDINGS: Cardiomegaly. There is hyperinflation of the lungs compatible with COPD. Aortic atherosclerosis. Airspace opacities bilaterally, most confluent in the right mid and lower lung. Appearance is concerning for  pneumonia. Diffuse interstitial prominence could also be related to infection or interstitial edema. IMPRESSION: Cardiomegaly, COPD.  Possible interstitial edema. Confluent airspace disease in the right mid and lower lung concerning for pneumonia. Electronically Signed   By: Rolm Baptise M.D.   On: 11/01/2019 17:41   DG Chest Portable 1 View  Result Date: 10/31/2019 CLINICAL DATA:  Chest pain and shortness of breath EXAM: PORTABLE CHEST 1 VIEW COMPARISON:  October 19, 2019 FINDINGS: There is slight scarring in the bases. There is no edema or consolidation. Heart is upper normal in size with pulmonary vascularity normal. No adenopathy. There is aortic atherosclerosis. There are foci of calcification in each subclavian and axillary artery. No bone lesions. IMPRESSION: Mild bibasilar scarring. No edema or consolidation. Stable cardiac silhouette. No adenopathy. There is extensive arterial vascular calcification. Aortic Atherosclerosis (ICD10-I70.0). Electronically Signed   By: Lowella Grip III M.D.   On: 10/31/2019 15:59   Medications: . sodium chloride    . sodium chloride    . ferric gluconate (FERRLECIT/NULECIT) IV     . aspirin EC  81 mg Oral Daily  . atorvastatin  40 mg Oral Daily  . buPROPion  150 mg Oral BID  . Chlorhexidine Gluconate Cloth  6 each Topical Q0600  . clopidogrel  75 mg Oral Daily  . darbepoetin (ARANESP) injection - DIALYSIS  200 mcg Intravenous Q Wed-HD  . doxercalciferol  1 mcg Intravenous Q M,W,F-HD  . ferric citrate  630 mg Oral TID WC  . ipratropium-albuterol  3 mL Nebulization TID  . methylPREDNISolone (SOLU-MEDROL) injection  40 mg Intravenous Q12H  . metoprolol succinate  50 mg Oral Daily  . mometasone-formoterol  2 puff Inhalation BID  . multivitamin  1 tablet Oral QHS  . QUEtiapine  200 mg Oral QHS  . sodium chloride flush  3 mL Intravenous Q12H      Claudia Desanctis 11/02/2019 8:12 AM

## 2019-11-02 NOTE — Progress Notes (Signed)
Inpatient Rehabilitation Admissions Coordinator  Inpatient rehab consult received. I met with patient at bedside for rehab assessment. Noted debility with need for rehab prior to return home. I will not have a bed available this week for this patient so I will not begin insurance Auth for a possible inpt rehab admit. I will notify RN CM and SW of need for assessment of other venue options.   Danne Baxter, RN, MSN Rehab Admissions Coordinator [phone] 11/02/2019 12:21 PM

## 2019-11-02 NOTE — Progress Notes (Signed)
CRITICAL VALUE ALERT  Critical Value:  Potassium 6.6  Date & Time Notied:  11/02/19  Provider Notified: Lurlean Leyden of [facility]  Orders Received/Actions taken: Administration of Kayexalate held 2/2 pt going to HD this AM.

## 2019-11-02 NOTE — Progress Notes (Signed)
   Post cath doing well.  No evidence of high-grade obstruction that could have caused pain.  We will sign off.   [facility] will sign off.   Medication Recommendations: None Other recommendations (labs, testing, etc): No new recommendations  Follow up as an outpatient: As needed

## 2019-11-02 NOTE — Progress Notes (Signed)
Renal Navigator met with patient at HD bedside to offer support. Patient known to Navigator from multiple prior hospitalizations. Patient acknowledges that she has been falling lately.  Attending/Dr. Algis Liming came to bedside and informed patient that she may not be so lucky if she continues to fall and that she could break a hip, etc next time. He told patient that she needs rehab and patient states she is willing to go.  Patient became tearful after MD left. Navigator offered brief supportive counseling and space for patient to share her feelings about her current state of health. She told Navigator that she knows she needs assistance and cannot take care of herself alone. Renal Navigator provided encouragement and told patient that everyone gets to a point where they needs to ask for help and commends her for knowing when she needs it.  Renal Navigator available for support as needed.  Alphonzo Cruise, [place] Renal Navigator [phone]

## 2019-11-02 NOTE — Progress Notes (Addendum)
PROGRESS NOTE   Kari Robinson  YSA:[id number]    DOB: 11/03/1951    DOA: 10/31/2019  PCP: Tsosie Billing, MD (Inactive)   I have briefly reviewed patients previous medical records in [facility].  Chief Complaint:   Chief Complaint  Patient presents with  . Chest Pain    Brief Narrative:  68 year old female with PMH of CAD status post stents, NSTEMI 09/2017, ischemic cardiomyopathy, chronic systolic CHF (EF improved from 25% to 45-50%), essential hypertension, hyperlipidemia, COPD on home oxygen 3 L/min, lung cancer s/p left lung resection 2017, ESRD on MWF HD, PAF, tobacco abuse, recently admitted 12/3-12/7 for chest pain and elevated troponin, cardiology evaluated and recommended medical management, presented to [facility] ED on 12/15 due to chest pain, some relief with sublingual NTG, at times worse with deep inspiration or touching her chest and multiple falls day prior to admission.  Cardiology was consulted, s/p cardiac cath 12/16 without high-grade obstruction, medical management recommended.  Post cath 12/16 developed acute on chronic hypoxic respiratory failure due to COPD exacerbation and fluid overload, unable to get HD right away, managed with IV steroids and BiPAP, had HD 12/17 with improvement in respiratory status.  Patient agreeable to rehab, CIR versus SNF.   Assessment & Plan:  Active Problems:   Essential hypertension   Coronary atherosclerosis   Normocytic normochromic anemia   DM (diabetes mellitus), type 2 with renal complications ([name])   Chronic systolic CHF (congestive heart failure) ([name])   COPD (chronic obstructive pulmonary disease) ([name])   Type II diabetes mellitus with renal manifestations ([name])   HLD (hyperlipidemia)   ESRD (end stage renal disease) on dialysis ([name])   Chest pain with moderate risk for cardiac etiology, hx CAD   Elevated troponin   Hyperkalemia   Weakness   Unstable angina ([place])   CAD s/p stents/chest pain/mild troponin  elevation  Cardiology was consulted.  Patient has extensive cardiac history.  Recent admission for similar presentation i.e. chest pain and elevated troponins.  Cardiology felt that she had chest pain with typical and atypical features and was high risk given prior stents.  Underwent cardiac cath 12/16 with detailed report as below, did not require any intervention, medical management recommended and consider alternate causes.  Chest pain resolved without recurrence.  ?  GI versus musculoskeletal etiology of chest pain.  Continue aspirin, Plavix, statins and metoprolol.  Acute on chronic hypoxic respiratory failure/COPD exacerbation/acute on chronic systolic CHF.  On afternoon of 12/16 post-cath developed worsening dyspnea and hypoxia.  Started on IV Solu-Medrol, bronchodilator nebulizations, required BiPAP overnight, unable to get HD yesterday due to scheduling issues from emergencies.  Had HD 12/17.  Improved and looks like she is saturating in the high 90s on room air.  Monitor closely.  Continue IV steroids for another day then consider stopping.  ESRD on MWF hemodialysis  Nephrology input appreciated.  Reportedly not able to make the last HD treatment as person who transported her was sick.  Nephrology unable to dialyze on 12/16 due to scheduling issues from emergencies and hence patient underwent HD on 12/17.  Hyperkalemia  Appears to be a recurrent problem, had this during previous admission.  Presented with potassium of 5.9, got a dose of Lokelma, down to 5.1 predialysis today.  Also had missed HD on 12/14.  Potassium back up to 6.6 this morning.  Post HD potassium down to 3.2, do not replace as discussed with Dr. Royce Macadamia.  Secondary hyperparathyroidism  Management per nephrology.  Deconditioning/frequent  falls  Patient refused SNF during prior admission.  Do not think that she is safe to return home.  OT recommends SNF and PT recommends CIR. Patient agreeable to  rehab.  CIR has no beds this week.  Await TOC team follow-up.  Tobacco abuse  Recently quit smoking in September 2020, encouraged and congratulated.  Essential hypertension  Controlled.  Volume management across dialysis.  Continue Toprol-XL.  Hyperlipidemia  Continue statins.  Anemia in ESRD  Her baseline hemoglobin is likely in the 9-10 range.  Aranesp per nephrology.  Continue ferric citrate.  Hemoglobin stable.  Depression  Stable on Wellbutrin and Seroquel, continue.  Follow EKG in a.m. to monitor QTC.  Added hydroxyzine as needed for anxiety yesterday.  Type II DM with renal manifestations  A1c 5.1.  DC SSI.  Reasonable inpatient control despite IV steroids.   DVT prophylaxis: SCDs Code Status: Full Family Communication: None at bedside Disposition: DC to CIR versus SNF pending clinical improvement, hopefully in the next 1 to 2 days.   Consultants:   Cardiology Nephrology Rehab MD  Procedures:   Cardiac cath 11/01/2019:   Prox RCA to Mid RCA lesion is 30% stenosed.  Prox Cx to Mid Cx lesion is 20% stenosed.  Mid Cx lesion is 50% stenosed.  Ost Cx to Prox Cx lesion is 40% stenosed.  Ost LAD to Mid LAD lesion is 30% stenosed.  1st Sept lesion is 60% stenosed.  There is moderate left ventricular systolic dysfunction.  LV end diastolic pressure is moderately elevated.  The left ventricular ejection fraction is 35-45% by visual estimate.  There is mild (2+) mitral regurgitation.   1. Diffuse, mild non-obstructive disease in the LAD  2. Patent stent in the mid Circumflex with minimal restenosis. Moderate stenosis just beyond the stented segment of the mid Circumflex artery. This does not appear to be flow limiting. 3. Mild non-obstructive disease in the mid RCA 4. Moderate, global LV systolic dysfunction. 5. Elevated filling pressures  Recommendations; Medical management of CAD. Explore other causes of chest pain.   Antimicrobials:    None   Subjective:  Overnight events noted.  Discussed with patient's RN.  On and off BiPAP overnight.  On HFNC 6 L/min this morning on way to dialysis.  Patient seen at HD.  Feels much better.  Dyspnea improved and indicates that her breathing is back to baseline.  No chest pain reported.  Objective:   Vitals:   11/02/19 1000 11/02/19 1021 11/02/19 1128 11/02/19 1145  BP: 124/66 (!) 109/52  (!) 169/99  Pulse: 71 88  (!) 113  Resp: (!) 21 (!) 22  (!) 23  Temp:  98.2 F (36.8 C)    TempSrc:  Oral    SpO2:  98% 94% 99%  Weight:      Height:        General exam: Pleasant middle-age female, small built and thinly nourished lying comfortably propped up in bed without distress.  Edentulous.  Seen at HD.  Looks much improved compared to 12/16 afternoon. Respiratory system: Much improved breath sounds.  Occasional rhonchi anteriorly.  Occasional basal crackles.  No increased work of breathing. Cardiovascular system: S1 & S2 heard, RRR. No JVD, murmurs, rubs, gallops or clicks. No pedal edema.  Telemetry personally reviewed: Sinus rhythm. Gastrointestinal system: Abdomen is nondistended, soft and nontender. No organomegaly or masses felt. Normal bowel sounds heard. Central nervous system: Alert and oriented. No focal neurological deficits. Extremities: Symmetric 5 x 5 power. Skin: No rashes, lesions  or ulcers Psychiatry: Judgement and insight appear normal. Mood & affect appropriate.     Data Reviewed:   I have personally reviewed following labs and imaging studies   CBC: Recent Labs  Lab 10/31/19 1451 11/01/19 0335 11/02/19 0309  WBC 7.7 7.1 8.0  NEUTROABS 6.0  --   --   HGB 11.5* 9.4* 10.2*  HCT 36.3 29.5* 31.4*  MCV 108.7* 107.7* 106.1*  PLT 210 178 347    Basic Metabolic Panel: Recent Labs  Lab 11/01/19 0058 11/01/19 0335 11/02/19 0309 11/02/19 0902  NA 140 140 139  --   K 5.5* 5.1 6.6* 3.2*  CL 95* 95* 96*  --   CO2 27 30 22   --   GLUCOSE 89 90 103*  --    BUN 77* 80* 95*  --   CREATININE 12.72* 12.90* 14.56*  --   CALCIUM 9.1 9.4 9.7  --   MG  --  2.8*  --   --   PHOS  --  7.3*  --   --     Liver Function Tests: Recent Labs  Lab 10/31/19 1451 11/01/19 0335  AST 20 17  ALT 16 14  ALKPHOS 99 91  BILITOT 0.5 0.3  PROT 6.8 5.7*  ALBUMIN 3.8 3.2*    CBG: Recent Labs  Lab 11/01/19 2326 11/02/19 0352 11/02/19 1135  GLUCAP 130* 110* 104*    Microbiology Studies:   Recent Results (from the past 240 hour(s))  Respiratory Panel by RT PCR (Flu A&B, Covid) - Nasopharyngeal Swab     Status: None   Collection Time: 10/31/19  8:55 PM   Specimen: Nasopharyngeal Swab  Result Value Ref Range Status   SARS Coronavirus 2 by RT PCR NEGATIVE NEGATIVE Final    Comment: (NOTE) SARS-CoV-2 target nucleic acids are NOT DETECTED. The SARS-CoV-2 RNA is generally detectable in upper respiratoy specimens during the acute phase of infection. The lowest concentration of SARS-CoV-2 viral copies this assay can detect is 131 copies/mL. A negative result does not preclude SARS-Cov-2 infection and should not be used as the sole basis for treatment or other patient management decisions. A negative result may occur with  improper specimen collection/handling, submission of specimen other than nasopharyngeal swab, presence of viral mutation(s) within the areas targeted by this assay, and inadequate number of viral copies (<131 copies/mL). A negative result must be combined with clinical observations, patient history, and epidemiological information. The expected result is Negative. Fact Sheet for Patients:  PinkCheek.be Fact Sheet for Healthcare Providers:  GravelBags.it This test is not yet ap proved or cleared by the [place] FDA and  has been authorized for detection and/or diagnosis of SARS-CoV-2 by FDA under an Emergency Use Authorization (EUA). This EUA will remain  in effect  (meaning this test can be used) for the duration of the COVID-19 declaration under Section 564(b)(1) of the Act, 21 U.S.C. section 360bbb-3(b)(1), unless the authorization is terminated or revoked sooner.    Influenza A by PCR NEGATIVE NEGATIVE Final   Influenza B by PCR NEGATIVE NEGATIVE Final    Comment: (NOTE) The Xpert Xpress SARS-CoV-2/FLU/RSV assay is intended as an aid in  the diagnosis of influenza from Nasopharyngeal swab specimens and  should not be used as a sole basis for treatment. Nasal washings and  aspirates are unacceptable for Xpert Xpress SARS-CoV-2/FLU/RSV  testing. Fact Sheet for Patients: PinkCheek.be Fact Sheet for Healthcare Providers: GravelBags.it This test is not yet approved or cleared by the [place] and  has been authorized for detection and/or diagnosis of SARS-CoV-2 by  FDA under an Emergency Use Authorization (EUA). This EUA will remain  in effect (meaning this test can be used) for the duration of the  Covid-19 declaration under Section 564(b)(1) of the Act, 21  U.S.C. section 360bbb-3(b)(1), unless the authorization is  terminated or revoked. Performed at [facility], [place] [address] [name], [place] [address]      Radiology Studies:  DG Chest Port 1 View  Result Date: 11/01/2019 CLINICAL DATA:  Shortness of breath EXAM: PORTABLE CHEST 1 VIEW COMPARISON:  10/31/2019 FINDINGS: Cardiomegaly. There is hyperinflation of the lungs compatible with COPD. Aortic atherosclerosis. Airspace opacities bilaterally, most confluent in the right mid and lower lung. Appearance is concerning for pneumonia. Diffuse interstitial prominence could also be related to infection or interstitial edema. IMPRESSION: Cardiomegaly, COPD.  Possible interstitial edema. Confluent airspace disease in the right mid and lower lung concerning for pneumonia. Electronically Signed   By: Rolm Baptise M.D.   On:  11/01/2019 17:41     Scheduled Meds:   . aspirin EC  81 mg Oral Daily  . atorvastatin  40 mg Oral Daily  . buPROPion  150 mg Oral BID  . Chlorhexidine Gluconate Cloth  6 each Topical Q0600  . clopidogrel  75 mg Oral Daily  . darbepoetin (ARANESP) injection - DIALYSIS  200 mcg Intravenous Q Wed-HD  . doxercalciferol  1 mcg Intravenous Q M,W,F-HD  . ferric citrate  630 mg Oral TID WC  . ipratropium-albuterol  3 mL Nebulization TID  . methylPREDNISolone (SOLU-MEDROL) injection  40 mg Intravenous Q12H  . metoprolol succinate  50 mg Oral Daily  . mometasone-formoterol  2 puff Inhalation BID  . multivitamin  1 tablet Oral QHS  . QUEtiapine  200 mg Oral QHS  . sodium chloride flush  3 mL Intravenous Q12H    Continuous Infusions:   . sodium chloride    . sodium chloride    . ferric gluconate (FERRLECIT/NULECIT) IV       LOS: 2 days     Vernell Leep, MD, [place], [facility]. [facility]    To contact the attending provider between 7A-7P or the covering provider during after hours 7P-7A, please log into the web site [email or web address] and access using universal [place] password for that web site. If you do not have the password, please call the hospital operator.  11/02/2019, 2:58 PM

## 2019-11-02 NOTE — Progress Notes (Signed)
Progress Note  Patient Name: Kari Robinson Date of Encounter: 11/02/2019  Primary Cardiologist: Minus Breeding, MD   Subjective   Events overnight noted. She is feeling much better this morning. Respiratory status has significantly improved.   Inpatient Medications    Scheduled Meds: . aspirin EC  81 mg Oral Daily  . atorvastatin  40 mg Oral Daily  . buPROPion  150 mg Oral BID  . Chlorhexidine Gluconate Cloth  6 each Topical Q0600  . clopidogrel  75 mg Oral Daily  . darbepoetin (ARANESP) injection - DIALYSIS  200 mcg Intravenous Q Wed-HD  . doxercalciferol  1 mcg Intravenous Q M,W,F-HD  . ferric citrate  630 mg Oral TID WC  . ipratropium-albuterol  3 mL Nebulization TID  . methylPREDNISolone (SOLU-MEDROL) injection  40 mg Intravenous Q12H  . metoprolol succinate  50 mg Oral Daily  . mometasone-formoterol  2 puff Inhalation BID  . multivitamin  1 tablet Oral QHS  . QUEtiapine  200 mg Oral QHS  . sodium chloride flush  3 mL Intravenous Q12H   Continuous Infusions: . sodium chloride    . sodium chloride    . ferric gluconate (FERRLECIT/NULECIT) IV     PRN Meds: sodium chloride, sodium chloride, acetaminophen **OR** acetaminophen, albuterol, alteplase, ferric citrate, heparin, HYDROcodone-acetaminophen, hydrOXYzine, lidocaine (PF), lidocaine-prilocaine, ondansetron **OR** ondansetron (ZOFRAN) IV, pentafluoroprop-tetrafluoroeth   Vital Signs    Vitals:   11/02/19 0635 11/02/19 0700 11/02/19 0730 11/02/19 0800  BP: (!) 150/87 (!) 151/86 (!) 144/91 (!) 134/111  Pulse: 94 90 90 84  Resp: 19 18 15 17   Temp:      TempSrc:      SpO2:      Weight:      Height:        Intake/Output Summary (Last 24 hours) at 11/02/2019 0928 Last data filed at 11/02/2019 0600 Gross per 24 hour  Intake 240 ml  Output 250 ml  Net -10 ml   Last 3 Weights 11/02/2019 11/02/2019 11/01/2019  Weight (lbs) 164 lb 7.4 oz 162 lb 4.1 oz 166 lb 0.1 oz  Weight (kg) 74.6 kg 73.6 kg 75.3 kg    Some encounter information is confidential and restricted. Go to Review Flowsheets activity to see all data.      Telemetry    N/a in HD  ECG    No new tracing.  Physical Exam  Pleasant older WM, undergoing HD GEN: No acute distress.   Neck: No JVD Cardiac: RRR, no murmurs, rubs, or gallops.  Respiratory: Clear to auscultation bilaterally. GI: Soft, nontender, non-distended  MS: No edema; No deformity. Right femoral cath site stable.  Neuro:  Nonfocal  Psych: Normal affect   Labs    High Sensitivity Troponin:   Recent Labs  Lab 10/19/19 0225 10/19/19 0425 10/31/19 1451 10/31/19 1803 11/01/19 0052  TROPONINIHS 80* 96* 83* 91* 95*  104*      Chemistry Recent Labs  Lab 10/31/19 1451 11/01/19 0058 11/01/19 0335 11/02/19 0309  NA 140 140 140 139  K 5.9* 5.5* 5.1 6.6*  CL 93* 95* 95* 96*  CO2 32 27 30 22   GLUCOSE 112* 89 90 103*  BUN 72* 77* 80* 95*  CREATININE 12.90* 12.72* 12.90* 14.56*  CALCIUM 9.7 9.1 9.4 9.7  PROT 6.8  --  5.7*  --   ALBUMIN 3.8  --  3.2*  --   AST 20  --  17  --   ALT 16  --  14  --  ALKPHOS 99  --  91  --   BILITOT 0.5  --  0.3  --   GFRNONAA 3* 3* 3* 2*  GFRAA 3* 3* 3* 3*  ANIONGAP 15 18* 15 21*     Hematology Recent Labs  Lab 10/31/19 1451 11/01/19 0335 11/02/19 0309  WBC 7.7 7.1 8.0  RBC 3.34* 2.74* 2.96*  HGB 11.5* 9.4* 10.2*  HCT 36.3 29.5* 31.4*  MCV 108.7* 107.7* 106.1*  MCH 34.4* 34.3* 34.5*  MCHC 31.7 31.9 32.5  RDW 15.6* 15.6* 15.3  PLT 210 178 202    BNP Recent Labs  Lab 10/31/19 1514  BNP 3,389.6*     DDimer No results for input(s): DDIMER in the last 168 hours.   Radiology    CARDIAC CATHETERIZATION  Result Date: 11/01/2019  Prox RCA to Mid RCA lesion is 30% stenosed.  Prox Cx to Mid Cx lesion is 20% stenosed.  Mid Cx lesion is 50% stenosed.  Ost Cx to Prox Cx lesion is 40% stenosed.  Ost LAD to Mid LAD lesion is 30% stenosed.  1st Sept lesion is 60% stenosed.  There is moderate left  ventricular systolic dysfunction.  LV end diastolic pressure is moderately elevated.  The left ventricular ejection fraction is 35-45% by visual estimate.  There is mild (2+) mitral regurgitation.  1. Diffuse, mild non-obstructive disease in the LAD 2. Patent stent in the mid Circumflex with minimal restenosis. Moderate stenosis just beyond the stented segment of the mid Circumflex artery. This does not appear to be flow limiting. 3. Mild non-obstructive disease in the mid RCA 4. Moderate, global LV systolic dysfunction. 5. Elevated filling pressures Recommendations; Medical management of CAD. Explore other causes of chest pain.   DG Chest Port 1 View  Result Date: 11/01/2019 CLINICAL DATA:  Shortness of breath EXAM: PORTABLE CHEST 1 VIEW COMPARISON:  10/31/2019 FINDINGS: Cardiomegaly. There is hyperinflation of the lungs compatible with COPD. Aortic atherosclerosis. Airspace opacities bilaterally, most confluent in the right mid and lower lung. Appearance is concerning for pneumonia. Diffuse interstitial prominence could also be related to infection or interstitial edema. IMPRESSION: Cardiomegaly, COPD.  Possible interstitial edema. Confluent airspace disease in the right mid and lower lung concerning for pneumonia. Electronically Signed   By: Rolm Baptise M.D.   On: 11/01/2019 17:41   DG Chest Portable 1 View  Result Date: 10/31/2019 CLINICAL DATA:  Chest pain and shortness of breath EXAM: PORTABLE CHEST 1 VIEW COMPARISON:  October 19, 2019 FINDINGS: There is slight scarring in the bases. There is no edema or consolidation. Heart is upper normal in size with pulmonary vascularity normal. No adenopathy. There is aortic atherosclerosis. There are foci of calcification in each subclavian and axillary artery. No bone lesions. IMPRESSION: Mild bibasilar scarring. No edema or consolidation. Stable cardiac silhouette. No adenopathy. There is extensive arterial vascular calcification. Aortic Atherosclerosis  (ICD10-I70.0). Electronically Signed   By: Lowella Grip III M.D.   On: 10/31/2019 15:59    Cardiac Studies   Cath: 11/01/19    Prox RCA to Mid RCA lesion is 30% stenosed.  Prox Cx to Mid Cx lesion is 20% stenosed.  Mid Cx lesion is 50% stenosed.  Ost Cx to Prox Cx lesion is 40% stenosed.  Ost LAD to Mid LAD lesion is 30% stenosed.  1st Sept lesion is 60% stenosed.  There is moderate left ventricular systolic dysfunction.  LV end diastolic pressure is moderately elevated.  The left ventricular ejection fraction is 35-45% by visual estimate.  There is mild (2+) mitral regurgitation.   1. Diffuse, mild non-obstructive disease in the LAD  2. Patent stent in the mid Circumflex with minimal restenosis. Moderate stenosis just beyond the stented segment of the mid Circumflex artery. This does not appear to be flow limiting. 3. Mild non-obstructive disease in the mid RCA 4. Moderate, global LV systolic dysfunction. 5. Elevated filling pressures  Recommendations; Medical management of CAD. Explore other causes of chest pain.   Diagnostic Dominance: Right    Patient Profile     68 y.o. female with a hx of CADw/ stents in [place] 2005 & 2016, ICM, COPD,lung CA s/p L lung resection 2017,ERSD on HD, PAF, tob use (quit 07/2019), NSTEMI 09/2017 w/ med rx(most meds not filled), S-CHF w/ EF25% 01/2018improved to 45-50%06/2019,who was seen for the evaluation ofchest painat the request of Dr. Billy Fischer.  Assessment & Plan    1. Chest pain with Hx of CAD: HsT peaked at 104, had relief of chest pain with SL nitro. Underwent cath noted above with patent Lcx stent, moderate nonobstructive disease noted in the LAD, and Lcx. No culprit lesion. Recommendation for continued medical management.  -- continue on ASA, plavix, statin, BB  2. ESRD on HD: undergoing HD this morning.  3. Chronic systolic HF: EF has been as low as 20% in the past. Improved to 40-45% on echo back in  06/2019. LV gram with 35-40%. Volume management per HD.   4. Anemia of chronic disease: baseline around 9-10. No reported issues with bleeding.  5. Hyperkalemia: 6.6 this morning. Taken for urgent HD.   6. COPD exacerbation with acute on chronic hypoxic respiratory failure: had to be placed on Bipap last evening, now weaned off. Given steroids and undergoing HD this morning. Reports significant improvement.   For questions or updates, please contact [place] Please consult [email or web address] for contact info under        Signed, Reino Bellis, NP  11/02/2019, 9:28 AM

## 2019-11-03 ENCOUNTER — Other Ambulatory Visit: Payer: Self-pay

## 2019-11-03 LAB — CBC
HCT: 31.7 % — ABNORMAL LOW (ref 36.0–46.0)
Hemoglobin: 10.1 g/dL — ABNORMAL LOW (ref 12.0–15.0)
MCH: 33.9 pg (ref 26.0–34.0)
MCHC: 31.9 g/dL (ref 30.0–36.0)
MCV: 106.4 fL — ABNORMAL HIGH (ref 80.0–100.0)
Platelets: 234 10*3/uL (ref 150–400)
RBC: 2.98 MIL/uL — ABNORMAL LOW (ref 3.87–5.11)
RDW: 15.5 % (ref 11.5–15.5)
WBC: 8 10*3/uL (ref 4.0–10.5)
nRBC: 0 % (ref 0.0–0.2)

## 2019-11-03 LAB — RENAL FUNCTION PANEL
Albumin: 3.4 g/dL — ABNORMAL LOW (ref 3.5–5.0)
Anion gap: 20 — ABNORMAL HIGH (ref 5–15)
BUN: 62 mg/dL — ABNORMAL HIGH (ref 8–23)
CO2: 25 mmol/L (ref 22–32)
Calcium: 9.3 mg/dL (ref 8.9–10.3)
Chloride: 92 mmol/L — ABNORMAL LOW (ref 98–111)
Creatinine, Ser: 8.95 mg/dL — ABNORMAL HIGH (ref 0.44–1.00)
GFR calc Af Amer: 5 mL/min — ABNORMAL LOW (ref >60)
GFR calc non Af Amer: 4 mL/min — ABNORMAL LOW (ref >60)
Glucose, Bld: 126 mg/dL — ABNORMAL HIGH (ref 70–99)
Phosphorus: 7.7 mg/dL — ABNORMAL HIGH (ref 2.5–4.6)
Potassium: 4.9 mmol/L (ref 3.5–5.1)
Sodium: 137 mmol/L (ref 135–145)

## 2019-11-03 LAB — GLUCOSE, CAPILLARY
Glucose-Capillary: 102 mg/dL — ABNORMAL HIGH (ref 70–99)
Glucose-Capillary: 141 mg/dL — ABNORMAL HIGH (ref 70–99)
Glucose-Capillary: 89 mg/dL (ref 70–99)
Glucose-Capillary: 135 mg/dL — ABNORMAL HIGH (ref 70–99)

## 2019-11-03 MED ORDER — NITROGLYCERIN 0.4 MG SL SUBL: 0.4000 mg | SUBLINGUAL_TABLET | SUBLINGUAL | Status: DC | PRN

## 2019-11-03 MED ORDER — DOXERCALCIFEROL 4 MCG/2ML IV SOLN
INTRAVENOUS | Status: AC
  Filled 2019-11-03: qty 2

## 2019-11-03 NOTE — Care Management Important Message (Signed)
Important Message  Patient Details  Name: Kari Robinson MRN: [id number] Date of Birth: Mar 09, 1951   Medicare Important Message Given:  Yes     Shelda Altes 11/03/2019, 12:17 PM

## 2019-11-03 NOTE — Evaluation (Signed)
PT Cancellation Note  Patient Details Name: Kari Robinson MRN: [id number] DOB: 03/27/1951   Cancelled Treatment:    Reason Eval/Treat Not Completed: Patient at procedure or test/unavailable. Will re-attempt this pm if time allows.    [name] 11/03/2019, 1:10 PM

## 2019-11-03 NOTE — TOC Initial Note (Signed)
Transition of Care [facility]) - Initial/Assessment Note    Patient Details  Name: Kari Robinson MRN: [id number] Date of Birth: 06-13-1951  Transition of Care [facility]) CM/SW Contact:    Vinie Sill, [place] Phone Number: 11/03/2019, 5:26 PM  Clinical Narrative:                  CSW visit with the patient at bedside. CSW introduced self and explained role. Patient states she lives home alone but her significant other lives in the same apartment complex, he is there a lot too. Patient states her son,Joseph lives out of state. CSW discussed PT recommendation of ST rehab at [facility]. Patient states she has been to rehab at [facility] before and believes it was helpful. Patient is agreeable to ST rehab at [facility]. Patient states no preference at this time. CSW gave patient medicare.gov SNF listing with reviews and rating-copy placed in shadow chart. Patient states she receives dialysis M,W,F -12 at [facility]  CSW explained the SNF process and possible limit in SNF choices because of dialysis needs. Patient states she understands. Patient states she will review SNF listing and inform CSW of her top 2 choices tomorrow.   CSW has started insurance authorization. Patient will need covid prior to discharge to SNF.  Thurmond Butts, MSW, [facility] Clinical Social Worker [phone]   Expected Discharge Plan: Skilled Nursing Facility Barriers to Discharge: Insurance Authorization, SNF Pending bed offer   Patient Goals and CMS Choice Patient states their goals for this hospitalization and ongoing recovery are:: "to get better "   Choice offered to / list presented to : Patient  Expected Discharge Plan and Services Expected Discharge Plan: [place]       Living arrangements for the past 2 months: Single Family Home                                      Prior Living Arrangements/Services Living arrangements for the past 2 months: Single Family Home Lives with::  Self, Other (Comment)(but significant other lives in the same apartment complex) Patient language and need for interpreter reviewed:: No Do you feel safe going back to the place where you live?: No      Need for Family Participation in Patient Care: Yes (Comment) Care giver support system in place?: Yes (comment)   Criminal Activity/Legal Involvement Pertinent to Current Situation/Hospitalization: No - Comment as needed  Activities of Daily Living Home Assistive Devices/Equipment: Walker (specify type) ADL Screening (condition at time of admission) Patient's cognitive ability adequate to safely complete daily activities?: Yes Is the patient deaf or have difficulty hearing?: No Does the patient have difficulty seeing, even when wearing glasses/contacts?: No Does the patient have difficulty concentrating, remembering, or making decisions?: No Patient able to express need for assistance with ADLs?: Yes Does the patient have difficulty dressing or bathing?: No Independently performs ADLs?: Yes (appropriate for developmental age) Does the patient have difficulty walking or climbing stairs?: Yes Weakness of Legs: Both Weakness of Arms/Hands: Both  Permission Sought/Granted Permission sought to share information with : Case Manager, Customer service manager, Family Supports Permission granted to share information with : Yes, Verbal Permission Granted  Share Information with NAME: Deatra Ina  Permission granted to share info w AGENCY: SNFs  Permission granted to share info w Relationship: significant other  Permission granted to share info w Contact Information: [phone]  Emotional  Assessment Appearance:: Appears stated age Attitude/Demeanor/Rapport: Engaged Affect (typically observed): Accepting, Appropriate, Pleasant Orientation: : Oriented to Self, Oriented to Place, Oriented to  Time, Oriented to Situation   Psych Involvement: No (comment)  Admission diagnosis:   Precordial pain [R07.2] Unstable angina ([name]) [I20.0] Hyperkalemia [E87.5] Patient Active Problem List   Diagnosis Date Noted  . Unstable angina ([place]) 10/31/2019  . Weakness 10/20/2019  . Chest pain with moderate risk for cardiac etiology, hx CAD 10/19/2019  . Elevated troponin 10/19/2019  . Hyperkalemia 10/19/2019  . Pneumonia 07/24/2019  . Dyspnea 07/24/2019  . Lumbar stenosis 05/09/2019  . Leg weakness, bilateral 04/01/2019  . Tobacco abuse 04/01/2019  . Alcohol abuse 04/01/2019  . ESRD (end stage renal disease) on dialysis ([place]) 02/24/2019  . Seizure-like activity ([place]) 02/24/2019  . Ischemic cardiomyopathy 02/24/2019  . Type II diabetes mellitus with renal manifestations ([place]) 09/30/2018  . HLD (hyperlipidemia) 09/30/2018  . Depression with anxiety 09/30/2018  . Anemia of chronic disease 09/30/2018  . COPD (chronic obstructive pulmonary disease) ([place])   . Solitary right kidney 06/24/2018  . Chronic systolic CHF (congestive heart failure) ([place]) 06/24/2018  . CKD (chronic kidney disease) stage 5, GFR less than 15 ml/min ([name]) 06/07/2018  . Normocytic normochromic anemia 06/07/2018  . DM (diabetes mellitus), type 2 with renal complications ([place]) 37/34/2876  . Occlusion and stenosis of carotid artery without mention of cerebral infarction 01/19/2012  . RENAL ARTERY STENOSIS 09/10/2010  . DEPRESSION 08/11/2010  . PERIPHERAL VASCULAR DISEASE 08/11/2010  . Essential hypertension 08/08/2010  . Coronary atherosclerosis 08/08/2010   PCP:  Tsosie Billing, MD (Inactive) Pharmacy:   [place], [place] - [address], [address], [place] [address] Phone: [phone] Fax: [phone]  [facility] [id number] [place], [place] - [place] [address] [place] [address] Phone: [phone] Fax: [phone]  [facility] [place], [place] [address] Phone: [phone] Fax: [phone]     Social Determinants of Health (SDOH) Interventions    Readmission Risk Interventions Readmission Risk Prevention Plan 07/04/2019  Transportation Screening Complete  Medication Review ([place]) Complete  HRI or Home Care Consult Complete  Palliative Care Screening Not Applicable  Some recent data might be hidden

## 2019-11-03 NOTE — Progress Notes (Signed)
Pt complaining of chest pain when transporter arrived to pt's room to take her to dialysis. Pt stated pain was 7/10 and was worse when she took a deep breath. EKG performed and results shown to MD. Vitals stable. MD to bedside to evaluate pt and does not feel pain is cardiac related. Pt agrees it may be related to anxiety. Pt provided tylenol. Dialysis nurse updated.

## 2019-11-03 NOTE — Progress Notes (Signed)
Progress Note  Patient Name: Kari Robinson Date of Encounter: 11/03/2019  Primary Cardiologist: Minus Breeding, MD   Subjective   Had chest pain this morning prior to dialysis.  Evaluated by Dr. Jackie Plum who felt discomfort was musculoskeletal.  Nevertheless he was concerned enough to call me.  I reviewed the EKG performed today.  It does reveal multifocal PVCs but no acute ST elevation.  There is ST depression as she has had intermittently in the past.  No current chest pain.  Inpatient Medications    Scheduled Meds: . aspirin EC  81 mg Oral Daily  . atorvastatin  40 mg Oral Daily  . buPROPion  150 mg Oral BID  . Chlorhexidine Gluconate Cloth  6 each Topical Q0600  . clopidogrel  75 mg Oral Daily  . darbepoetin (ARANESP) injection - DIALYSIS  200 mcg Intravenous Q Wed-HD  . doxercalciferol      . doxercalciferol  1 mcg Intravenous Q M,W,F-HD  . ferric citrate  630 mg Oral TID WC  . ipratropium-albuterol  3 mL Nebulization TID  . methylPREDNISolone (SOLU-MEDROL) injection  40 mg Intravenous Q12H  . metoprolol succinate  50 mg Oral Daily  . mometasone-formoterol  2 puff Inhalation BID  . multivitamin  1 tablet Oral QHS  . QUEtiapine  200 mg Oral QHS  . sodium chloride flush  3 mL Intravenous Q12H   Continuous Infusions: . sodium chloride    . sodium chloride    . ferric gluconate (FERRLECIT/NULECIT) IV     PRN Meds: sodium chloride, sodium chloride, acetaminophen **OR** acetaminophen, albuterol, alteplase, ferric citrate, heparin, HYDROcodone-acetaminophen, hydrOXYzine, lidocaine (PF), lidocaine-prilocaine, ondansetron **OR** ondansetron (ZOFRAN) IV, pentafluoroprop-tetrafluoroeth   Vital Signs    Vitals:   11/03/19 0944 11/03/19 1000 11/03/19 1030 11/03/19 1100  BP: 126/80 (!) 129/57 (!) 101/57 (!) 83/44  Pulse: 84 75 62 70  Resp: 16 16 15 15   Temp:      TempSrc:      SpO2:      Weight:      Height:        Intake/Output Summary (Last 24 hours) at  11/03/2019 1202 Last data filed at 11/02/2019 2013 Gross per 24 hour  Intake 603 ml  Output 200 ml  Net 403 ml   Last 3 Weights 11/03/2019 11/02/2019 11/02/2019  Weight (lbs) 160 lb 0.9 oz 160 lb 0.9 oz 164 lb 7.4 oz  Weight (kg) 72.6 kg 72.6 kg 74.6 kg  Some encounter information is confidential and restricted. Go to Review Flowsheets activity to see all data.      Telemetry    Normal sinus rhythm with PVCs.- Personally Reviewed  ECG    Normal sinus rhythm, prominent voltage, ST abnormality inferior and mid anterior leads, multifocal PVCs,.  When compared to prior tracings the PVCs are new.- Personally Reviewed  Physical Exam  In no distress. GEN:  Chronically ill appearing.  Somewhat pale. Neck: No JVD Cardiac: RRR, no murmurs, rubs, or gallops.  Respiratory: Clear to auscultation bilaterally. GI: Soft, nontender, non-distended  MS: No edema; No deformity. Neuro:  Nonfocal  Psych: Normal affect   Labs    High Sensitivity Troponin:   Recent Labs  Lab 10/19/19 0225 10/19/19 0425 10/31/19 1451 10/31/19 1803 11/01/19 0052  TROPONINIHS 80* 96* 83* 91* 95*  104*      Chemistry Recent Labs  Lab 10/31/19 1451 11/01/19 0335 11/02/19 0309 11/02/19 0902 11/03/19 1000  NA 140 140 139  --  137  K 5.9*  5.1 6.6* 3.2* 4.9  CL 93* 95* 96*  --  92*  CO2 32 30 22  --  25  GLUCOSE 112* 90 103*  --  126*  BUN 72* 80* 95*  --  62*  CREATININE 12.90* 12.90* 14.56*  --  8.95*  CALCIUM 9.7 9.4 9.7  --  9.3  PROT 6.8 5.7*  --   --   --   ALBUMIN 3.8 3.2*  --   --  3.4*  AST 20 17  --   --   --   ALT 16 14  --   --   --   ALKPHOS 99 91  --   --   --   BILITOT 0.5 0.3  --   --   --   GFRNONAA 3* 3* 2*  --  4*  GFRAA 3* 3* 3*  --  5*  ANIONGAP 15 15 21*  --  20*     Hematology Recent Labs  Lab 11/01/19 0335 11/02/19 0309 11/03/19 1000  WBC 7.1 8.0 8.0  RBC 2.74* 2.96* 2.98*  HGB 9.4* 10.2* 10.1*  HCT 29.5* 31.4* 31.7*  MCV 107.7* 106.1* 106.4*  MCH 34.3* 34.5*  33.9  MCHC 31.9 32.5 31.9  RDW 15.6* 15.3 15.5  PLT 178 202 234    BNP Recent Labs  Lab 10/31/19 1514  BNP 3,389.6*     DDimer No results for input(s): DDIMER in the last 168 hours.   Radiology    DG Chest Port 1 View  Result Date: 11/01/2019 CLINICAL DATA:  Shortness of breath EXAM: PORTABLE CHEST 1 VIEW COMPARISON:  10/31/2019 FINDINGS: Cardiomegaly. There is hyperinflation of the lungs compatible with COPD. Aortic atherosclerosis. Airspace opacities bilaterally, most confluent in the right mid and lower lung. Appearance is concerning for pneumonia. Diffuse interstitial prominence could also be related to infection or interstitial edema. IMPRESSION: Cardiomegaly, COPD.  Possible interstitial edema. Confluent airspace disease in the right mid and lower lung concerning for pneumonia. Electronically Signed   By: Rolm Baptise M.D.   On: 11/01/2019 17:41    Cardiac Studies   Cardiac catheterization images were reviewed.  There is a 50 to 70% circumflex lesion within the distal margin of the stent extending into the immediate nonstented segment beyond the stent.  It does not appear to be flow obstructing/limiting.  Mild to moderate right and LAD disease is also noted.  Patient Profile     68 y.o. female with a hx of CADw/ stents in Fl 2005 & 2016, ICM, COPD,lung CA s/p L lung resection 2017,ERSD on HD, PAF, tob use (quit 07/2019), NSTEMI 09/2017 w/ med rx(most meds not filled), S-CHF w/ EF25% 01/2018improved to 45-50%06/2019,who was seen for the evaluation ofchest painat the request of Dr. Algis Liming.  This intervention is unremarkable with the exception of mild to moderate distal circumflex in-stent and post stent stenosis.  Assessment & Plan    1. CAD with moderate disease as noted above. 2. Chest pain today not felt to represent ischemic pain. 3. Multiform PVCs on EKG.  Replete electrolytes if abnormal.    [facility] will sign off.   Medication Recommendations:   none Other recommendations (labs, testing, etc): sl NTG if recurrent pain. Follow up as an outpatient:  PRN while in hospital. Dr. Percival Spanish as OP.  For questions or updates, please contact [place] Please consult [email or web address] for contact info under        Signed, Sinclair Grooms, MD  11/03/2019, 12:02 PM

## 2019-11-03 NOTE — Procedures (Signed)
I was present at this dialysis session. I have reviewed the session itself and made appropriate changes. Tolerating dialysis without issue.  Vital signs in last 24 hours:  Temp:  [98.1 F (36.7 C)-98.5 F (36.9 C)] 98.2 F (36.8 C) (12/18 0351) Pulse Rate:  [80-113] 81 (12/18 0351) Resp:  [16-23] 17 (12/18 0351) BP: (109-169)/(52-99) 136/92 (12/18 0351) SpO2:  [91 %-100 %] 91 % (12/18 0739) Weight:  [72.6 kg] 72.6 kg (12/18 0351) Weight change: -1 kg Filed Weights   11/02/19 0630 11/02/19 1021 11/03/19 0351  Weight: 74.6 kg 72.6 kg 72.6 kg    Recent Labs  Lab 11/01/19 0335 11/02/19 0309 11/02/19 0902  NA 140 139  --   K 5.1 6.6* 3.2*  CL 95* 96*  --   CO2 30 22  --   GLUCOSE 90 103*  --   BUN 80* 95*  --   CREATININE 12.90* 14.56*  --   CALCIUM 9.4 9.7  --   PHOS 7.3*  --   --     Recent Labs  Lab 10/31/19 1451 11/01/19 0335 11/02/19 0309  WBC 7.7 7.1 8.0  NEUTROABS 6.0  --   --   HGB 11.5* 9.4* 10.2*  HCT 36.3 29.5* 31.4*  MCV 108.7* 107.7* 106.1*  PLT 210 178 202    Scheduled Meds: . aspirin EC  81 mg Oral Daily  . atorvastatin  40 mg Oral Daily  . buPROPion  150 mg Oral BID  . Chlorhexidine Gluconate Cloth  6 each Topical Q0600  . clopidogrel  75 mg Oral Daily  . darbepoetin (ARANESP) injection - DIALYSIS  200 mcg Intravenous Q Wed-HD  . doxercalciferol  1 mcg Intravenous Q M,W,F-HD  . ferric citrate  630 mg Oral TID WC  . ipratropium-albuterol  3 mL Nebulization TID  . methylPREDNISolone (SOLU-MEDROL) injection  40 mg Intravenous Q12H  . metoprolol succinate  50 mg Oral Daily  . mometasone-formoterol  2 puff Inhalation BID  . multivitamin  1 tablet Oral QHS  . QUEtiapine  200 mg Oral QHS  . sodium chloride flush  3 mL Intravenous Q12H   Continuous Infusions: . sodium chloride    . sodium chloride    . ferric gluconate (FERRLECIT/NULECIT) IV     PRN Meds:.sodium chloride, sodium chloride, acetaminophen **OR** acetaminophen, albuterol, alteplase,  ferric citrate, heparin, HYDROcodone-acetaminophen, hydrOXYzine, lidocaine (PF), lidocaine-prilocaine, ondansetron **OR** ondansetron (ZOFRAN) IV, pentafluoroprop-tetrafluoroeth     Dialysis Orders: MWF at [facility] 3:30hr, 350/800, EDW 72.5kg, 2K/2Ca, UFP 4, AVF, no heparin - Hectoral 61mcg IV q HD - Venofer 50mg  IV q week - MIrcera 142mcg IV q 2 weeks (last 11/30)  Donetta Potts,  MD 11/03/2019, 10:18 AM

## 2019-11-03 NOTE — Progress Notes (Addendum)
PROGRESS NOTE   Story Conti  EXH:[id number]    DOB: [birth year]    DOA: 10/31/2019  PCP: Tsosie Billing, MD (Inactive)   I have briefly reviewed patients previous medical records in [facility].  Chief Complaint:   Chief Complaint  Patient presents with  . Chest Pain    Brief Narrative:  68 year old female with PMH of CAD status post stents, NSTEMI 09/2017, ischemic cardiomyopathy, chronic systolic CHF (EF improved from 25% to 45-50%), essential hypertension, hyperlipidemia, COPD on home oxygen 3 L/min, lung cancer s/p left lung resection 2017, ESRD on MWF HD, PAF, tobacco abuse, recently admitted 12/3-12/7 for chest pain and elevated troponin, cardiology evaluated and recommended medical management, presented to [facility] ED on 12/15 due to chest pain, some relief with sublingual NTG, at times worse with deep inspiration or touching her chest and multiple falls day prior to admission.  Cardiology was consulted, s/p cardiac cath 12/16 without high-grade obstruction, medical management recommended.  Post cath 12/16 developed acute on chronic hypoxic respiratory failure due to COPD exacerbation and fluid overload, unable to get HD right away, managed with IV steroids and BiPAP, had HD 12/17 with improvement in respiratory status.  Patient agreeable to rehab, CIR versus SNF.  Now medically stable for discharge pending bed availability.   Assessment & Plan:  Active Problems:   Essential hypertension   Coronary atherosclerosis   Normocytic normochromic anemia   DM (diabetes mellitus), type 2 with renal complications ([name])   Chronic systolic CHF (congestive heart failure) ([name])   COPD (chronic obstructive pulmonary disease) ([name])   Type II diabetes mellitus with renal manifestations ([name])   HLD (hyperlipidemia)   ESRD (end stage renal disease) on dialysis ([name])   Chest pain with moderate risk for cardiac etiology, hx CAD   Elevated troponin   Hyperkalemia   Weakness    Unstable angina ([place])   CAD s/p stents/chest pain/mild troponin elevation  Cardiology was consulted.  Patient has extensive cardiac history.  Recent admission for similar presentation i.e. chest pain and elevated troponins.  Cardiology felt that she had chest pain with typical and atypical features and was high risk given prior stents.  Underwent cardiac cath 12/16 with detailed report as below, did not require any intervention, medical management recommended and consider alternate causes.  Chest pain resolved without recurrence.  ?  GI versus musculoskeletal etiology of chest pain.  Continue aspirin, Plavix, statins and metoprolol.  On 12/18, patient developed lower substernal chest discomfort prior to dialysis.  I evaluated patient at bedside.  History quite suggestive of musculoskeletal etiology.  Reviewed repeat EKG with cardiology, no acute changes, has some PVCs.  Treat supportively with Tylenol.  Patient reassured.  Chest pain seemed to resolve.  Acute on chronic hypoxic respiratory failure/COPD exacerbation/acute on chronic systolic CHF.  On afternoon of 12/16 post-cath developed worsening dyspnea and hypoxia.  Started on IV Solu-Medrol, bronchodilator nebulizations, required BiPAP overnight, unable to get HD yesterday due to scheduling issues from emergencies.  Had HD 12/17.  Improved and looks like she is saturating in the high 90s on room air.  Monitor closely.  I suspect her worsening dyspnea was more from decompensated CHF rather than COPD.  Her acute respiratory issues have resolved and she is back on her home dose of oxygen.  Discontinued IV Solu-Medrol.  ESRD on MWF hemodialysis  Nephrology input appreciated.  Reportedly not able to make the last HD treatment as person who transported her was sick.  Patient underwent dialysis  on 12/17 and 12/18.  Hyperkalemia  Appears to be a recurrent problem, had this during previous admission.  Resolved after  dialysis.  Secondary hyperparathyroidism  Management per nephrology.  Deconditioning/frequent falls  Patient refused SNF during prior admission.  Do not think that she is safe to return home.  OT recommends SNF and PT recommends CIR. Patient agreeable to rehab.  CIR has no beds this week.  Awaiting follow-up from [facility] team regarding discharge plans to facility.  Tobacco abuse  Recently quit smoking in September 2020, encouraged and congratulated.  Essential hypertension  Controlled.  Volume management across dialysis.  Continue Toprol-XL.  Hyperlipidemia  Continue statins.  Anemia in ESRD  Her baseline hemoglobin is likely in the 9-10 range.  Aranesp per nephrology.  Continue ferric citrate.  Hemoglobin stable.  Depression  Stable on Wellbutrin and Seroquel, continue.  Follow EKG in a.m. to monitor QTC.  Added hydroxyzine as needed for anxiety yesterday.  Type II DM with renal manifestations  A1c 5.1.  DC SSI.  Reasonable inpatient control despite IV steroids.   DVT prophylaxis: SCDs Code Status: Full Family Communication: None at bedside. I discussed with patient's son, updated care and answered questions.  Disposition: Patient medically stable for discharge pending SNF bed/insurance etc.   Consultants:   Cardiology Nephrology Rehab MD  Procedures:   Cardiac cath 11/01/2019:   Prox RCA to Mid RCA lesion is 30% stenosed.  Prox Cx to Mid Cx lesion is 20% stenosed.  Mid Cx lesion is 50% stenosed.  Ost Cx to Prox Cx lesion is 40% stenosed.  Ost LAD to Mid LAD lesion is 30% stenosed.  1st Sept lesion is 60% stenosed.  There is moderate left ventricular systolic dysfunction.  LV end diastolic pressure is moderately elevated.  The left ventricular ejection fraction is 35-45% by visual estimate.  There is mild (2+) mitral regurgitation.   1. Diffuse, mild non-obstructive disease in the LAD  2. Patent stent in the mid Circumflex with minimal  restenosis. Moderate stenosis just beyond the stented segment of the mid Circumflex artery. This does not appear to be flow limiting. 3. Mild non-obstructive disease in the mid RCA 4. Moderate, global LV systolic dysfunction. 5. Elevated filling pressures  Recommendations; Medical management of CAD. Explore other causes of chest pain.   Antimicrobials:   None   Subjective:  Patient seen this morning in her room prior to HD, reported lower substernal chest pain, worse with deep inspiration and to touch.  No dyspnea reported.  Indicates breathing is back to her baseline.  Evaluated along with RN while HD team was there to pick her up.  Chest pain subsequently resolved.  Objective:   Vitals:   11/03/19 1130 11/03/19 1200 11/03/19 1402 11/03/19 1426  BP: (!) 110/52 (!) 101/48 (!) 105/59   Pulse: 70 (!) 52 100   Resp: 15 15 17    Temp:      TempSrc:      SpO2:   95% 94%  Weight:      Height:        General exam: Pleasant middle-age female, small built and thinly nourished lying comfortably propped up in bed without distress.  Edentulous.  Did not appear in any distress. Respiratory system: Clear to auscultation.  No increased work of breathing.  Reproducible lower mid substernal mild chest tenderness Cardiovascular system: S1 & S2 heard, RRR. No JVD, murmurs, rubs, gallops or clicks. No pedal edema.  Sinus rhythm with BBB morphology Gastrointestinal system: Abdomen is nondistended,  soft and nontender. No organomegaly or masses felt. Normal bowel sounds heard. Central nervous system: Alert and oriented. No focal neurological deficits. Extremities: Symmetric 5 x 5 power. Skin: No rashes, lesions or ulcers Psychiatry: Judgement and insight appear normal. Mood & affect appropriate.     Data Reviewed:   I have personally reviewed following labs and imaging studies   CBC: Recent Labs  Lab 10/31/19 1451 11/01/19 0335 11/02/19 0309 11/03/19 1000  WBC 7.7 7.1 8.0 8.0  NEUTROABS  6.0  --   --   --   HGB 11.5* 9.4* 10.2* 10.1*  HCT 36.3 29.5* 31.4* 31.7*  MCV 108.7* 107.7* 106.1* 106.4*  PLT 210 178 202 048    Basic Metabolic Panel: Recent Labs  Lab 11/01/19 0335 11/02/19 0309 11/02/19 0902 11/03/19 1000  NA 140 139  --  137  K 5.1 6.6* 3.2* 4.9  CL 95* 96*  --  92*  CO2 30 22  --  25  GLUCOSE 90 103*  --  126*  BUN 80* 95*  --  62*  CREATININE 12.90* 14.56*  --  8.95*  CALCIUM 9.4 9.7  --  9.3  MG 2.8*  --   --   --   PHOS 7.3*  --   --  7.7*    Liver Function Tests: Recent Labs  Lab 10/31/19 1451 11/01/19 0335 11/03/19 1000  AST 20 17  --   ALT 16 14  --   ALKPHOS 99 91  --   BILITOT 0.5 0.3  --   PROT 6.8 5.7*  --   ALBUMIN 3.8 3.2* 3.4*    CBG: Recent Labs  Lab 11/02/19 2010 11/02/19 2335 11/03/19 0619  GLUCAP 157* 175* 102*    Microbiology Studies:   Recent Results (from the past 240 hour(s))  Respiratory Panel by RT PCR (Flu A&B, Covid) - Nasopharyngeal Swab     Status: None   Collection Time: 10/31/19  8:55 PM   Specimen: Nasopharyngeal Swab  Result Value Ref Range Status   SARS Coronavirus 2 by RT PCR NEGATIVE NEGATIVE Final    Comment: (NOTE) SARS-CoV-2 target nucleic acids are NOT DETECTED. The SARS-CoV-2 RNA is generally detectable in upper respiratoy specimens during the acute phase of infection. The lowest concentration of SARS-CoV-2 viral copies this assay can detect is 131 copies/mL. A negative result does not preclude SARS-Cov-2 infection and should not be used as the sole basis for treatment or other patient management decisions. A negative result may occur with  improper specimen collection/handling, submission of specimen other than nasopharyngeal swab, presence of viral mutation(s) within the areas targeted by this assay, and inadequate number of viral copies (<131 copies/mL). A negative result must be combined with clinical observations, patient history, and epidemiological information. The expected  result is Negative. Fact Sheet for Patients:  PinkCheek.be Fact Sheet for Healthcare Providers:  GravelBags.it This test is not yet ap proved or cleared by the [place] FDA and  has been authorized for detection and/or diagnosis of SARS-CoV-2 by FDA under an Emergency Use Authorization (EUA). This EUA will remain  in effect (meaning this test can be used) for the duration of the COVID-19 declaration under Section 564(b)(1) of the Act, 21 U.S.C. section 360bbb-3(b)(1), unless the authorization is terminated or revoked sooner.    Influenza A by PCR NEGATIVE NEGATIVE Final   Influenza B by PCR NEGATIVE NEGATIVE Final    Comment: (NOTE) The Xpert Xpress SARS-CoV-2/FLU/RSV assay is intended as an aid in  the diagnosis of influenza from Nasopharyngeal swab specimens and  should not be used as a sole basis for treatment. Nasal washings and  aspirates are unacceptable for Xpert Xpress SARS-CoV-2/FLU/RSV  testing. Fact Sheet for Patients: PinkCheek.be Fact Sheet for Healthcare Providers: GravelBags.it This test is not yet approved or cleared by the [place] FDA and  has been authorized for detection and/or diagnosis of SARS-CoV-2 by  FDA under an Emergency Use Authorization (EUA). This EUA will remain  in effect (meaning this test can be used) for the duration of the  Covid-19 declaration under Section 564(b)(1) of the Act, 21  U.S.C. section 360bbb-3(b)(1), unless the authorization is  terminated or revoked. Performed at [facility], [place] [address], [place], [place] [address]      Radiology Studies:  No results found.   Scheduled Meds:   . aspirin EC  81 mg Oral Daily  . atorvastatin  40 mg Oral Daily  . buPROPion  150 mg Oral BID  . Chlorhexidine Gluconate Cloth  6 each Topical Q0600  . clopidogrel  75 mg Oral Daily  . darbepoetin (ARANESP)  injection - DIALYSIS  200 mcg Intravenous Q Wed-HD  . doxercalciferol  1 mcg Intravenous Q M,W,F-HD  . ferric citrate  630 mg Oral TID WC  . ipratropium-albuterol  3 mL Nebulization TID  . methylPREDNISolone (SOLU-MEDROL) injection  40 mg Intravenous Q12H  . metoprolol succinate  50 mg Oral Daily  . mometasone-formoterol  2 puff Inhalation BID  . multivitamin  1 tablet Oral QHS  . QUEtiapine  200 mg Oral QHS  . sodium chloride flush  3 mL Intravenous Q12H    Continuous Infusions:   . sodium chloride    . sodium chloride    . ferric gluconate (FERRLECIT/NULECIT) IV       LOS: 3 days     Vernell Leep, MD, [place], [facility]. [facility]    To contact the attending provider between 7A-7P or the covering provider during after hours 7P-7A, please log into the web site [email or web address] and access using universal [place] password for that web site. If you do not have the password, please call the hospital operator.  11/03/2019, 3:38 PM

## 2019-11-03 NOTE — Progress Notes (Cosign Needed)
Admit: 10/31/2019 LOS: 3    Subjective:  Today Kari Robinson is sitting up in bed. She denies difficulty breathing. She states she tolerated HD yesterday well. She has not urinated yet this morning which is odd for her, but otherwise has no new complaints.  12/17 0701 - 12/18 0700 In: 603 [P.O.:600; I.V.:3] Out: 2700 [Urine:200]  Filed Weights   11/02/19 0630 11/02/19 1021 11/03/19 0351  Weight: 74.6 kg 72.6 kg 72.6 kg    Scheduled Meds:  aspirin EC  81 mg Oral Daily   atorvastatin  40 mg Oral Daily   buPROPion  150 mg Oral BID   Chlorhexidine Gluconate Cloth  6 each Topical Q0600   clopidogrel  75 mg Oral Daily   darbepoetin (ARANESP) injection - DIALYSIS  200 mcg Intravenous Q Wed-HD   doxercalciferol  1 mcg Intravenous Q M,W,F-HD   ferric citrate  630 mg Oral TID WC   ipratropium-albuterol  3 mL Nebulization TID   methylPREDNISolone (SOLU-MEDROL) injection  40 mg Intravenous Q12H   metoprolol succinate  50 mg Oral Daily   mometasone-formoterol  2 puff Inhalation BID   multivitamin  1 tablet Oral QHS   QUEtiapine  200 mg Oral QHS   sodium chloride flush  3 mL Intravenous Q12H   Continuous Infusions:  sodium chloride     sodium chloride     ferric gluconate (FERRLECIT/NULECIT) IV     PRN Meds:.sodium chloride, sodium chloride, acetaminophen **OR** acetaminophen, albuterol, alteplase, ferric citrate, heparin, HYDROcodone-acetaminophen, hydrOXYzine, lidocaine (PF), lidocaine-prilocaine, ondansetron **OR** ondansetron (ZOFRAN) IV, pentafluoroprop-tetrafluoroeth  Current Labs:   Recent Labs  Lab 11/01/19 0058 11/01/19 0335 11/02/19 0309 11/02/19 0902  NA 140 140 139  --   K 5.5* 5.1 6.6* 3.2*  CL 95* 95* 96*  --   CO2 27 30 22   --   GLUCOSE 89 90 103*  --   BUN 77* 80* 95*  --   CREATININE 12.72* 12.90* 14.56*  --   CALCIUM 9.1 9.4 9.7  --   PHOS  --  7.3*  --   --    Recent Labs  Lab 10/31/19 1451 11/01/19 0335 11/02/19 0309  WBC 7.7 7.1 8.0  NEUTROABS 6.0   --   --   HGB 11.5* 9.4* 10.2*  HCT 36.3 29.5* 31.4*  MCV 108.7* 107.7* 106.1*  PLT 210 178 202    Physical Exam:  Blood pressure (!) 136/92, pulse 81, temperature 98.2 F (36.8 C), temperature source Oral, resp. rate 17, height 5\' 4"  (1.626 m), weight 72.6 kg, SpO2 91 %. Physical Exam  Constitutional: She appears well-developed and well-nourished.  Cardiovascular: Normal rate and regular rhythm.  Pulmonary/Chest: Effort normal.  Course crackles throughout mid and lower Rt lung   Abdominal: Soft. Bowel sounds are normal. She exhibits no distension. There is no abdominal tenderness.  Skin: Skin is warm and dry.  Without edema  HD Access: Rt forearm AVF with bruit  12/16 CXR: IMPRESSION: Cardiomegaly, COPD.  Possible interstitial edema.   Confluent airspace disease in the right mid and lower lung concerning for pneumonia.  Assessment and Plan ESRD. MWF schedule, Missed Monday 12/14 outpatient, tolerated HD inpatient Wednesday and Thursday. HD today per normal schedule  Hyperkalemia. Secondary to ESRD. Corrected with HD  Anemia. Secondary to ESRD. Hgb stable  - Ferric Citrate TID with meals. Venofer and Aranesp weekly  Secondary hyperparathyroidism. Due to ESRD. Calcium stable - Hectoral with dialysis  Hypertension. Currently controlled on Toprol XL, Hydralazine as needed  Coronary  Artery disease, Chest pain. 12/16 cath did not reveal cause for chest pain per cardiology note, they have signed off.   Chronic Obstructive Pulmonary Disease. Continued home rate of 3L O2 via Nasal cannula. 1 more day of steroids per hospitalist note  Diabetes Mellitis, type II. A1c 5.1 on 12/16. Insulin   Depression. Controlled with Wellbutrin, seroquel.   Recurrent Falls/ Recurrent hospitalizations> Renal navigator is in process of coordinating patient for rehab, which patient has agreed.  Kari Felty PA student 11/03/2019, 8:44 AM

## 2019-11-04 LAB — GLUCOSE, CAPILLARY
Glucose-Capillary: 85 mg/dL (ref 70–99)
Glucose-Capillary: 123 mg/dL — ABNORMAL HIGH (ref 70–99)
Glucose-Capillary: 104 mg/dL — ABNORMAL HIGH (ref 70–99)
Glucose-Capillary: 89 mg/dL (ref 70–99)

## 2019-11-04 NOTE — Progress Notes (Addendum)
[facility] Progress Note   Subjective:  Patient seen and examined at bedside.  "Feeling the best I have all week."  Breathing improved post dialysis.  Going home once a bed is available.  Denies CP, SOB, n/v/d, abdominal pain, and fatigue.   Objective Vitals:   11/03/19 2055 11/04/19 0618 11/04/19 0753 11/04/19 0900  BP:  133/84 114/78   Pulse:  85  88  Resp:  18 (!) 24 16  Temp:  98.7 F (37.1 C) 97.7 F (36.5 C)   TempSrc:  Oral Oral   SpO2: 98% 97% 97% 99%  Weight:      Height:       Physical Exam General:NAD, WDWN Heart:RRR Lungs:mostly CTAB, faint crackles in RLL Abdomen:soft, NTND Extremities:no LE edema Dialysis Access: RU AVF +b   Filed Weights   11/02/19 0630 11/02/19 1021 11/03/19 0351  Weight: 74.6 kg 72.6 kg 72.6 kg    Intake/Output Summary (Last 24 hours) at 11/04/2019 1044 Last data filed at 11/03/2019 1314 Gross per 24 hour  Intake --  Output 1101 ml  Net -1101 ml    Additional Objective Labs: Basic Metabolic Panel: Recent Labs  Lab 11/01/19 0335 11/02/19 0309 11/02/19 0902 11/03/19 1000  NA 140 139  --  137  K 5.1 6.6* 3.2* 4.9  CL 95* 96*  --  92*  CO2 30 22  --  25  GLUCOSE 90 103*  --  126*  BUN 80* 95*  --  62*  CREATININE 12.90* 14.56*  --  8.95*  CALCIUM 9.4 9.7  --  9.3  PHOS 7.3*  --   --  7.7*   Liver Function Tests: Recent Labs  Lab 10/31/19 1451 11/01/19 0335 11/03/19 1000  AST 20 17  --   ALT 16 14  --   ALKPHOS 99 91  --   BILITOT 0.5 0.3  --   PROT 6.8 5.7*  --   ALBUMIN 3.8 3.2* 3.4*  CBC: Recent Labs  Lab 10/31/19 1451 11/01/19 0335 11/02/19 0309 11/03/19 1000  WBC 7.7 7.1 8.0 8.0  NEUTROABS 6.0  --   --   --   HGB 11.5* 9.4* 10.2* 10.1*  HCT 36.3 29.5* 31.4* 31.7*  MCV 108.7* 107.7* 106.1* 106.4*  PLT 210 178 202 234   CBG: Recent Labs  Lab 11/03/19 0619 11/03/19 1359 11/03/19 1634 11/03/19 2127 11/04/19 0620  GLUCAP 102* 141* 89 135* 85    Lab Results  Component Value  Date   INR 1.1 10/18/2019   INR 1.1 07/23/2019   INR 1.11 06/06/2018   Studies/Results: No results found.  Medications: . sodium chloride    . sodium chloride    . ferric gluconate (FERRLECIT/NULECIT) IV     . aspirin EC  81 mg Oral Daily  . atorvastatin  40 mg Oral Daily  . buPROPion  150 mg Oral BID  . Chlorhexidine Gluconate Cloth  6 each Topical Q0600  . clopidogrel  75 mg Oral Daily  . darbepoetin (ARANESP) injection - DIALYSIS  200 mcg Intravenous Q Wed-HD  . doxercalciferol  1 mcg Intravenous Q M,W,F-HD  . ferric citrate  630 mg Oral TID WC  . ipratropium-albuterol  3 mL Nebulization TID  . metoprolol succinate  50 mg Oral Daily  . mometasone-formoterol  2 puff Inhalation BID  . multivitamin  1 tablet Oral QHS  . QUEtiapine  200 mg Oral QHS  . sodium chloride flush  3 mL Intravenous Q12H    Dialysis  Orders: MWF at [facility] 3:30hr, 350/800, EDW 72.5kg, 2K/2Ca, UFP 4, AVF, no heparin - Hectoral 64mcg IV q HD - Venofer 50mg  IV q week - MIrcera 123mcg IV q 2 weeks (last 11/30)  Assessment/Plan: 1. CP/SOB - Improved with HD. cath with medical management recommended per 12/16 report.   2. ESRD - HD yesterday tolerated well. K 4.9 pre HD yesterday.  Next dialysis 12/21.   3. Anemia - Hgb 10.1; on aranesp 254mcg qwk(Wed) 4. Secondary hyperparathyroidism - Ca in goal. P high - missing binders.  Continue VDRA/binders. 5. HTN/volume - Blood pressure well controlled.  Fluid status improved. At EDW post HD yesterday. 6. Nutrition - renal diet/vits 7. Severe COPD - chronic O2, likely contributory to SOB 8.  Deconditioning./falls - ongoing issue - Plan to go to SNF, awaiting placement - d/w in [facility]  But still with falls.   9. Mild hyperkalemia - resolved with HD.   Jen Mow, PA-C [facility] Pager: [phone] 11/04/2019,10:44 AM  LOS: 4 days   I have seen and examined this patient and agree with plan and assessment in the above note with renal  recommendations/intervention highlighted.  Feels well.  Awaiting SNF placement.  [name] A Genie Mirabal,MD 11/04/2019 11:25 AM

## 2019-11-04 NOTE — NC FL2 (Signed)
[place] LEVEL OF CARE SCREENING TOOL     IDENTIFICATION  Patient Name: Kari Robinson Birthdate: 1951/05/28 Sex: female Admission Date (Current Location): 10/31/2019  [facility] and [place] Number:  Herbalist and Address:  [facility], [place] [address] [place], [place] [address]      Provider Number: [id number]  Attending Physician Name and Address:  Modena Jansky, MD  Relative Name and Phone Number:  [name] (son) [phone]    Current Level of Care: Hospital Recommended Level of Care: [place] Prior Approval Number:    Date Approved/Denied:   PASRR Number: pending  Discharge Plan: SNF    Current Diagnoses: Patient Active Problem List   Diagnosis Date Noted  . Unstable angina ([place]) 10/31/2019  . Weakness 10/20/2019  . Chest pain with moderate risk for cardiac etiology, hx CAD 10/19/2019  . Elevated troponin 10/19/2019  . Hyperkalemia 10/19/2019  . Pneumonia 07/24/2019  . Dyspnea 07/24/2019  . Lumbar stenosis 05/09/2019  . Leg weakness, bilateral 04/01/2019  . Tobacco abuse 04/01/2019  . Alcohol abuse 04/01/2019  . ESRD (end stage renal disease) on dialysis ([place]) 02/24/2019  . Seizure-like activity ([place]) 02/24/2019  . Ischemic cardiomyopathy 02/24/2019  . Type II diabetes mellitus with renal manifestations ([place]) 09/30/2018  . HLD (hyperlipidemia) 09/30/2018  . Depression with anxiety 09/30/2018  . Anemia of chronic disease 09/30/2018  . COPD (chronic obstructive pulmonary disease) ([place])   . Solitary right kidney 06/24/2018  . Chronic systolic CHF (congestive heart failure) ([place]) 06/24/2018  . CKD (chronic kidney disease) stage 5, GFR less than 15 ml/min ([name]) 06/07/2018  . Normocytic normochromic anemia 06/07/2018  . DM (diabetes mellitus), type 2 with renal complications ([place]) 95/07/3266  . Occlusion and stenosis of carotid artery without mention of cerebral infarction 01/19/2012  . RENAL  ARTERY STENOSIS 09/10/2010  . DEPRESSION 08/11/2010  . PERIPHERAL VASCULAR DISEASE 08/11/2010  . Essential hypertension 08/08/2010  . Coronary atherosclerosis 08/08/2010    Orientation RESPIRATION BLADDER Height & Weight     Self, Time, Situation, Place  O2(Nasal cannula 3L) Continent, External catheter Weight: 160 lb 0.9 oz (72.6 kg) Height:  5\' 4"  (162.6 cm)  BEHAVIORAL SYMPTOMS/MOOD NEUROLOGICAL BOWEL NUTRITION STATUS      Continent Diet(Please see DC summary)  AMBULATORY STATUS COMMUNICATION OF NEEDS Skin   Extensive Assist Verbally Normal                       Personal Care Assistance Level of Assistance  Bathing, Feeding, Dressing, Total care Bathing Assistance: Limited assistance Feeding assistance: Independent Dressing Assistance: Limited assistance     Functional Limitations Info  Sight, Hearing, Speech Sight Info: Impaired Hearing Info: Adequate Speech Info: Adequate    SPECIAL CARE FACTORS FREQUENCY  PT (By licensed PT), OT (By licensed OT)     PT Frequency: 5x OT Frequency: 5x            Contractures Contractures Info: Not present    Additional Factors Info  Code Status, Allergies, Psychotropic Code Status Info: Full Allergies Info: Citalopram Hydrobromide, Morphine And Related, Tape Psychotropic Info: Wellbutrin; Seroquel         Current Medications (11/04/2019):  This is the current hospital active medication list Current Facility-Administered Medications  Medication Dose Route Frequency Provider Last Rate Last Admin  . 0.9 %  sodium chloride infusion  100 mL Intravenous PRN Burnell Blanks, MD      . 0.9 %  sodium chloride infusion  100 mL Intravenous PRN Burnell Blanks, MD      . acetaminophen (TYLENOL) tablet 650 mg  650 mg Oral Q6H PRN Burnell Blanks, MD   650 mg at 11/03/19 8527   Or  . acetaminophen (TYLENOL) suppository 650 mg  650 mg Rectal Q6H PRN Burnell Blanks, MD      . albuterol (PROVENTIL)  (2.5 MG/3ML) 0.083% nebulizer solution 3 mL  3 mL Inhalation Q4H PRN Burnell Blanks, MD   3 mL at 11/01/19 1518  . alteplase (CATHFLO ACTIVASE) injection 2 mg  2 mg Intracatheter Once PRN Burnell Blanks, MD      . aspirin EC tablet 81 mg  81 mg Oral Daily Burnell Blanks, MD   81 mg at 11/04/19 0953  . atorvastatin (LIPITOR) tablet 40 mg  40 mg Oral Daily Burnell Blanks, MD   40 mg at 11/04/19 0953  . buPROPion (WELLBUTRIN SR) 12 hr tablet 150 mg  150 mg Oral BID Burnell Blanks, MD   150 mg at 11/04/19 0953  . Chlorhexidine Gluconate Cloth 2 % PADS 6 each  6 each Topical Q0600 Burnell Blanks, MD   6 each at 11/03/19 [phone]  . clopidogrel (PLAVIX) tablet 75 mg  75 mg Oral Daily Burnell Blanks, MD   75 mg at 11/04/19 0953  . Darbepoetin Alfa (ARANESP) injection 200 mcg  200 mcg Intravenous Q Wed-HD Alric Seton, PA-C      . doxercalciferol (HECTOROL) injection 1 mcg  1 mcg Intravenous Q M,W,F-HD Alric Seton, PA-C   1 mcg at 11/03/19 1128  . ferric citrate (AURYXIA) tablet 420 mg  420 mg Oral PRN Burnell Blanks, MD      . ferric citrate (AURYXIA) tablet 630 mg  630 mg Oral TID WC Burnell Blanks, MD   630 mg at 11/04/19 0748  . ferric gluconate (NULECIT) 62.5 mg in sodium chloride 0.9 % 100 mL IVPB  62.5 mg Intravenous Q Wed-HD Alric Seton, PA-C      . heparin injection 1,000 Units  1,000 Units Dialysis PRN Burnell Blanks, MD      . HYDROcodone-acetaminophen (NORCO/VICODIN) 5-325 MG per tablet 1-2 tablet  1-2 tablet Oral Q4H PRN Burnell Blanks, MD      . hydrOXYzine (ATARAX/VISTARIL) tablet 10 mg  10 mg Oral Q12H PRN Hongalgi, Anand D, MD      . ipratropium-albuterol (DUONEB) 0.5-2.5 (3) MG/3ML nebulizer solution 3 mL  3 mL Nebulization TID Modena Jansky, MD   3 mL at 11/04/19 0900  . lidocaine (PF) (XYLOCAINE) 1 % injection 5 mL  5 mL Intradermal PRN Burnell Blanks, MD      .  lidocaine-prilocaine (EMLA) cream 1 application  1 application Topical PRN Burnell Blanks, MD      . metoprolol succinate (TOPROL-XL) 24 hr tablet 50 mg  50 mg Oral Daily Burnell Blanks, MD   50 mg at 11/04/19 0953  . mometasone-formoterol (DULERA) 200-5 MCG/ACT inhaler 2 puff  2 puff Inhalation BID Burnell Blanks, MD   2 puff at 11/04/19 0900  . multivitamin (RENA-VIT) tablet 1 tablet  1 tablet Oral QHS Alric Seton, PA-C   1 tablet at 11/03/19 2300  . nitroGLYCERIN (NITROSTAT) SL tablet 0.4 mg  0.4 mg Sublingual Q5 min PRN Hongalgi, Anand D, MD      . ondansetron (ZOFRAN) tablet 4 mg  4 mg Oral Q6H PRN Lauree Chandler  D, MD       Or  . ondansetron (ZOFRAN) injection 4 mg  4 mg Intravenous Q6H PRN Burnell Blanks, MD      . pentafluoroprop-tetrafluoroeth (GEBAUERS) aerosol 1 application  1 application Topical PRN Burnell Blanks, MD      . QUEtiapine (SEROQUEL) tablet 200 mg  200 mg Oral QHS Burnell Blanks, MD   200 mg at 11/03/19 2300  . sodium chloride flush (NS) 0.9 % injection 3 mL  3 mL Intravenous Q12H Burnell Blanks, MD   3 mL at 11/04/19 0900     Discharge Medications: Please see discharge summary for a list of discharge medications.  Relevant Imaging Results:  Relevant Lab Results:   Additional Information SSN: [id number]                      HD seat time is 12:10pm at [place] negative 12/15  Benard Halsted,

## 2019-11-04 NOTE — Progress Notes (Addendum)
PROGRESS NOTE   Kari Robinson  INO:[id number]    DOB: September 01, 1951    DOA: 10/31/2019  PCP: Tsosie Billing, MD (Inactive)   I have briefly reviewed patients previous medical [facility] in [facility].  Chief Complaint:   Chief Complaint  Patient presents with  . Chest Pain    Brief Narrative:  68 year old female with PMH of CAD status post stents, NSTEMI 09/2017, ischemic cardiomyopathy, chronic systolic CHF (EF improved from 25% to 45-50%), essential hypertension, hyperlipidemia, COPD on home oxygen 3 L/min, lung cancer s/p left lung resection 2017, ESRD on MWF HD, PAF, tobacco abuse, recently admitted 12/3-12/7 for chest pain and elevated troponin, cardiology evaluated and recommended medical management, presented to [facility] ED on 12/15 due to chest pain, some relief with sublingual NTG, at times worse with deep inspiration or touching her chest and multiple falls day prior to admission.  Cardiology was consulted, s/p cardiac cath 12/16 without high-grade obstruction, medical management recommended.  Post cath 12/16 developed acute on chronic hypoxic respiratory failure due to COPD exacerbation and fluid overload, unable to get HD right away, managed with IV steroids and BiPAP, had HD 12/17 with improvement in respiratory status.  Patient agreeable to rehab, CIR versus SNF.  Now medically stable for discharge pending bed availability.  As per clinical social worker, earliest that patient could go to SNF would be 12/21 pending SNF bed, insurance and PASSR.   Assessment & Plan:  Active Problems:   Essential hypertension   Coronary atherosclerosis   Normocytic normochromic anemia   DM (diabetes mellitus), type 2 with renal complications ([name])   Chronic systolic CHF (congestive heart failure) ([name])   COPD (chronic obstructive pulmonary disease) ([name])   Type II diabetes mellitus with renal manifestations ([name])   HLD (hyperlipidemia)   ESRD (end stage renal disease) on dialysis  ([name])   Chest pain with moderate risk for cardiac etiology, hx CAD   Elevated troponin   Hyperkalemia   Weakness   Unstable angina ([place])   CAD s/p stents/chest pain/mild troponin elevation  Cardiology was consulted.  Patient has extensive cardiac history.  Recent admission for similar presentation i.e. chest pain and elevated troponins.  Cardiology felt that she had chest pain with typical and atypical features and was high risk given prior stents.  Underwent cardiac cath 12/16 with detailed report as below, did not require any intervention, medical management recommended and consider alternate causes.  Chest pain resolved without recurrence.  ?  GI versus musculoskeletal etiology of chest pain.  Continue aspirin, Plavix, statins and metoprolol.  On 12/18, patient developed lower substernal chest discomfort prior to dialysis.  I evaluated patient at bedside.  History quite suggestive of musculoskeletal etiology.  Reviewed repeat EKG with cardiology, no acute changes, has some PVCs.  Treat supportively with Tylenol.  Patient reassured.  Chest pain resolved without recurrence.  Acute on chronic hypoxic respiratory failure/COPD exacerbation/acute on chronic systolic CHF.  On afternoon of 12/16 post-cath developed worsening dyspnea and hypoxia.  Started on IV Solu-Medrol, bronchodilator nebulizations, required BiPAP overnight, unable to get HD yesterday due to scheduling issues from emergencies.  Had HD 12/17.  Improved and looks like she is saturating in the high 90s on room air.  Monitor closely.  I suspect her worsening dyspnea was more from decompensated CHF rather than COPD.  Her acute respiratory issues have resolved and she is back on her home dose of oxygen.  Discontinued IV Solu-Medrol.  Improved and stable.  ESRD on MWF  hemodialysis  Nephrology input appreciated.  Reportedly not able to make the last HD treatment as person who transported her was sick.  Patient underwent  dialysis on 12/17 and 12/18.  Next HD 12/21.  Hyperkalemia  Appears to be a recurrent problem, had this during previous admission.  Resolved after dialysis.  Monitor labs at HD.  Secondary hyperparathyroidism  Management per nephrology.  Deconditioning/frequent falls  Patient refused SNF during prior admission.  Do not think that she is safe to return home.  OT recommends SNF and PT recommends CIR. Patient agreeable to rehab.  Awaiting SNF placement.  Tobacco abuse  Recently quit smoking in September 2020, encouraged and congratulated.  Essential hypertension  Controlled.  Volume management across dialysis.  Continue Toprol-XL.  Hyperlipidemia  Continue statins.  Anemia in ESRD  Her baseline hemoglobin is likely in the 9-10 range.  Aranesp per nephrology.  Continue ferric citrate.  Hemoglobin stable.  Depression  Stable on Wellbutrin and Seroquel, continue.  EKG 12/18: QTC mildly prolonged at 479 ms but patient has BBB morphology and not sure if this is totally accurate.  Follow EKG periodically for QTC monitoring.  Added hydroxyzine as needed for anxiety yesterday.  Type II DM with renal manifestations  A1c 5.1.  DC SSI.  Reasonable inpatient control despite IV steroids.   DVT prophylaxis: SCDs Code Status: Full Family Communication: None at bedside. I discussed with patient's son on 12/18, updated care and answered questions.  Disposition: Patient medically stable for discharge pending SNF bed/insurance etc.   Consultants:   Cardiology Nephrology Rehab MD  Procedures:   Cardiac cath 11/01/2019:   Prox RCA to Mid RCA lesion is 30% stenosed.  Prox Cx to Mid Cx lesion is 20% stenosed.  Mid Cx lesion is 50% stenosed.  Ost Cx to Prox Cx lesion is 40% stenosed.  Ost LAD to Mid LAD lesion is 30% stenosed.  1st Sept lesion is 60% stenosed.  There is moderate left ventricular systolic dysfunction.  LV end diastolic pressure is moderately  elevated.  The left ventricular ejection fraction is 35-45% by visual estimate.  There is mild (2+) mitral regurgitation.   1. Diffuse, mild non-obstructive disease in the LAD  2. Patent stent in the mid Circumflex with minimal restenosis. Moderate stenosis just beyond the stented segment of the mid Circumflex artery. This does not appear to be flow limiting. 3. Mild non-obstructive disease in the mid RCA 4. Moderate, global LV systolic dysfunction. 5. Elevated filling pressures  Recommendations; Medical management of CAD. Explore other causes of chest pain.   Antimicrobials:   None   Subjective:  Reports feeling well.  Denies complaints.  Breathing at baseline.  No recurrence of chest pain.  Objective:   Vitals:   11/04/19 0618 11/04/19 0753 11/04/19 0900 11/04/19 1110  BP: 133/84 114/78  114/66  Pulse: 85  88 82  Resp: 18 (!) 24 16 20   Temp: 98.7 F (37.1 C) 97.7 F (36.5 C)  98.2 F (36.8 C)  TempSrc: Oral Oral  Oral  SpO2: 97% 97% 99% 99%  Weight:      Height:        General exam: Pleasant middle-age female, small built and thinly sitting up in bed eating breakfast this morning. Respiratory system: Clear to auscultation.  No increased work of breathing. Cardiovascular system: S1 & S2 heard, RRR. No JVD, murmurs, rubs, gallops or clicks. No pedal edema.  Telemetry personally reviewed: Sinus rhythm with BBB morphology and occasional PVCs. Gastrointestinal system: Abdomen is  nondistended, soft and nontender. No organomegaly or masses felt. Normal bowel sounds heard. Central nervous system: Alert and oriented. No focal neurological deficits. Extremities: Symmetric 5 x 5 power. Skin: No rashes, lesions or ulcers.  Small area of old bruise over right intrascapular area, possibly from previous fall Psychiatry: Judgement and insight appear normal. Mood & affect pleasant and appropriate.     Data Reviewed:   I have personally reviewed following labs and imaging  studies   CBC: Recent Labs  Lab 10/31/19 1451 11/01/19 0335 11/02/19 0309 11/03/19 1000  WBC 7.7 7.1 8.0 8.0  NEUTROABS 6.0  --   --   --   HGB 11.5* 9.4* 10.2* 10.1*  HCT 36.3 29.5* 31.4* 31.7*  MCV 108.7* 107.7* 106.1* 106.4*  PLT 210 178 202 270    Basic Metabolic Panel: Recent Labs  Lab 11/01/19 0335 11/02/19 0309 11/02/19 0902 11/03/19 1000  NA 140 139  --  137  K 5.1 6.6* 3.2* 4.9  CL 95* 96*  --  92*  CO2 30 22  --  25  GLUCOSE 90 103*  --  126*  BUN 80* 95*  --  62*  CREATININE 12.90* 14.56*  --  8.95*  CALCIUM 9.4 9.7  --  9.3  MG 2.8*  --   --   --   PHOS 7.3*  --   --  7.7*    Liver Function Tests: Recent Labs  Lab 10/31/19 1451 11/01/19 0335 11/03/19 1000  AST 20 17  --   ALT 16 14  --   ALKPHOS 99 91  --   BILITOT 0.5 0.3  --   PROT 6.8 5.7*  --   ALBUMIN 3.8 3.2* 3.4*    CBG: Recent Labs  Lab 11/03/19 2127 11/04/19 0620 11/04/19 1110  GLUCAP 135* 85 123*    Microbiology Studies:   Recent Results (from the past 240 hour(s))  Respiratory Panel by RT PCR (Flu A&B, Covid) - Nasopharyngeal Swab     Status: None   Collection Time: 10/31/19  8:55 PM   Specimen: Nasopharyngeal Swab  Result Value Ref Range Status   SARS Coronavirus 2 by RT PCR NEGATIVE NEGATIVE Final    Comment: (NOTE) SARS-CoV-2 target nucleic acids are NOT DETECTED. The SARS-CoV-2 RNA is generally detectable in upper respiratoy specimens during the acute phase of infection. The lowest concentration of SARS-CoV-2 viral copies this assay can detect is 131 copies/mL. A negative result does not preclude SARS-Cov-2 infection and should not be used as the sole basis for treatment or other patient management decisions. A negative result may occur with  improper specimen collection/handling, submission of specimen other than nasopharyngeal swab, presence of viral mutation(s) within the areas targeted by this assay, and inadequate number of viral copies (<131 copies/mL). A  negative result must be combined with clinical observations, patient history, and epidemiological information. The expected result is Negative. Fact Sheet for Patients:  PinkCheek.be Fact Sheet for Healthcare Providers:  GravelBags.it This test is not yet ap proved or cleared by the [place] FDA and  has been authorized for detection and/or diagnosis of SARS-CoV-2 by FDA under an Emergency Use Authorization (EUA). This EUA will remain  in effect (meaning this test can be used) for the duration of the COVID-19 declaration under Section 564(b)(1) of the Act, 21 U.S.C. section 360bbb-3(b)(1), unless the authorization is terminated or revoked sooner.    Influenza A by PCR NEGATIVE NEGATIVE Final   Influenza B by PCR NEGATIVE NEGATIVE Final  Comment: (NOTE) The Xpert Xpress SARS-CoV-2/FLU/RSV assay is intended as an aid in  the diagnosis of influenza from Nasopharyngeal swab specimens and  should not be used as a sole basis for treatment. Nasal washings and  aspirates are unacceptable for Xpert Xpress SARS-CoV-2/FLU/RSV  testing. Fact Sheet for Patients: PinkCheek.be Fact Sheet for Healthcare Providers: GravelBags.it This test is not yet approved or cleared by the [place] FDA and  has been authorized for detection and/or diagnosis of SARS-CoV-2 by  FDA under an Emergency Use Authorization (EUA). This EUA will remain  in effect (meaning this test can be used) for the duration of the  Covid-19 declaration under Section 564(b)(1) of the Act, 21  U.S.C. section 360bbb-3(b)(1), unless the authorization is  terminated or revoked. Performed at [facility], [address] [place], [place] [address]      Radiology Studies:  No results found.   Scheduled Meds:   . aspirin EC  81 mg Oral Daily  . atorvastatin  40 mg Oral Daily  . buPROPion  150 mg Oral  BID  . Chlorhexidine Gluconate Cloth  6 each Topical Q0600  . clopidogrel  75 mg Oral Daily  . darbepoetin (ARANESP) injection - DIALYSIS  200 mcg Intravenous Q Wed-HD  . doxercalciferol  1 mcg Intravenous Q M,W,F-HD  . ferric citrate  630 mg Oral TID WC  . ipratropium-albuterol  3 mL Nebulization TID  . metoprolol succinate  50 mg Oral Daily  . mometasone-formoterol  2 puff Inhalation BID  . multivitamin  1 tablet Oral QHS  . QUEtiapine  200 mg Oral QHS  . sodium chloride flush  3 mL Intravenous Q12H    Continuous Infusions:   . sodium chloride    . sodium chloride    . ferric gluconate (FERRLECIT/NULECIT) IV       LOS: 4 days     Vernell Leep, MD, [place], [facility]. [facility]    To contact the attending provider between 7A-7P or the covering provider during after hours 7P-7A, please log into the web site [email or web address] and access using universal [place] password for that web site. If you do not have the password, please call the hospital operator.  11/04/2019, 12:34 PM

## 2019-11-05 LAB — GLUCOSE, CAPILLARY
Glucose-Capillary: 93 mg/dL (ref 70–99)
Glucose-Capillary: 99 mg/dL (ref 70–99)
Glucose-Capillary: 104 mg/dL — ABNORMAL HIGH (ref 70–99)
Glucose-Capillary: 116 mg/dL — ABNORMAL HIGH (ref 70–99)

## 2019-11-05 LAB — SARS CORONAVIRUS 2 (TAT 6-24 HRS): SARS Coronavirus 2: NEGATIVE

## 2019-11-05 MED ORDER — CHLORHEXIDINE GLUCONATE CLOTH 2 % EX PADS: 6.0000 | MEDICATED_PAD | Freq: Every day | CUTANEOUS | Status: DC

## 2019-11-05 NOTE — Progress Notes (Signed)
PROGRESS NOTE   Kari Robinson  KXF:[id number]    DOB: 1951/10/30    DOA: 10/31/2019  PCP: Tsosie Billing, MD (Inactive)   I have briefly reviewed patients previous medical records in [facility].  Chief Complaint:   Chief Complaint  Patient presents with  . Chest Pain    Brief Narrative:  68 year old female with PMH of CAD status post stents, NSTEMI 09/2017, ischemic cardiomyopathy, chronic systolic CHF (EF improved from 25% to 45-50%), essential hypertension, hyperlipidemia, COPD on home oxygen 3 L/min, lung cancer s/p left lung resection 2017, ESRD on MWF HD, PAF, tobacco abuse, recently admitted 12/3-12/7 for chest pain and elevated troponin, cardiology evaluated and recommended medical management, presented to [facility] ED on 12/15 due to chest pain, some relief with sublingual NTG, at times worse with deep inspiration or touching her chest and multiple falls day prior to admission.  Cardiology was consulted, s/p cardiac cath 12/16 without high-grade obstruction, medical management recommended.  Post cath 12/16 developed acute on chronic hypoxic respiratory failure due to COPD exacerbation and fluid overload, unable to get HD right away, managed with IV steroids and BiPAP, had HD 12/17 with improvement in respiratory status.  Patient agreeable to rehab, CIR versus SNF.  Now medically stable for discharge pending bed availability.  As per clinical social worker, earliest that patient could go to SNF would be 12/21 pending SNF bed, insurance and PASSR.  COVID-19 testing in preparation for SNF ordered.   Assessment & Plan:  Active Problems:   Essential hypertension   Coronary atherosclerosis   Normocytic normochromic anemia   DM (diabetes mellitus), type 2 with renal complications ([name])   Chronic systolic CHF (congestive heart failure) ([name])   COPD (chronic obstructive pulmonary disease) ([name])   Type II diabetes mellitus with renal manifestations ([name])   HLD  (hyperlipidemia)   ESRD (end stage renal disease) on dialysis ([name])   Chest pain with moderate risk for cardiac etiology, hx CAD   Elevated troponin   Hyperkalemia   Weakness   Unstable angina ([place])   CAD s/p stents/chest pain/mild troponin elevation  Cardiology was consulted.  Patient has extensive cardiac history.  Recent admission for similar presentation i.e. chest pain and elevated troponins.  Cardiology felt that she had chest pain with typical and atypical features and was high risk given prior stents.  Underwent cardiac cath 12/16 with detailed report as below, did not require any intervention, medical management recommended and consider alternate causes.  Chest pain resolved without recurrence.  ?  GI versus musculoskeletal etiology of chest pain.  Continue aspirin, Plavix, statins and metoprolol.  On 12/18, patient developed lower substernal chest discomfort prior to dialysis.  I evaluated patient at bedside.  History quite suggestive of musculoskeletal etiology.  Reviewed repeat EKG with cardiology, no acute changes, has some PVCs.  Treat supportively with Tylenol.  Patient reassured.  Chest pain resolved without recurrence.  Acute on chronic hypoxic respiratory failure/COPD exacerbation/acute on chronic systolic CHF.  On afternoon of 12/16 post-cath developed worsening dyspnea and hypoxia.  Started on IV Solu-Medrol, bronchodilator nebulizations, required BiPAP overnight, unable to get HD yesterday due to scheduling issues from emergencies.  Had HD 12/17.  Improved and looks like she is saturating in the high 90s on room air.  Monitor closely.  I suspect her worsening dyspnea was more from decompensated CHF rather than COPD.  Her acute respiratory issues have resolved and she is back on her home dose of oxygen.  Discontinued IV Solu-Medrol.  Stable on prior home level of oxygen.  ESRD on MWF hemodialysis  Nephrology input appreciated.  Reportedly not able to make the  last HD treatment as person who transported her was sick.  Patient underwent dialysis on 12/17 and 12/18.  Next HD 12/21.  Hyperkalemia  Appears to be a recurrent problem, had this during previous admission.  Resolved after dialysis.  Monitor labs at HD.  Secondary hyperparathyroidism  Management per nephrology.  Deconditioning/frequent falls  Patient refused SNF during prior admission.  Do not think that she is safe to return home.  OT recommends SNF and PT recommends CIR. Patient agreeable to rehab.  Awaiting SNF placement.  Tobacco abuse  Recently quit smoking in September 2020, encouraged and congratulated.  Essential hypertension  Controlled.  Volume management across dialysis.  Continue Toprol-XL.  Hyperlipidemia  Continue statins.  Anemia in ESRD  Her baseline hemoglobin is likely in the 9-10 range.  Aranesp per nephrology.  Continue ferric citrate.  Hemoglobin stable.  Depression  Stable on Wellbutrin and Seroquel, continue.  EKG 12/18: QTC mildly prolonged at 479 ms but patient has BBB morphology and not sure if this is totally accurate.  Follow EKG periodically for QTC monitoring.  Added hydroxyzine as needed for anxiety yesterday.  Type II DM with renal manifestations  A1c 5.1.  DC SSI.  Reasonable inpatient control despite IV steroids.   DVT prophylaxis: SCDs Code Status: Full Family Communication: None at bedside. I discussed with patient's son on 12/18, updated care and answered questions.  Disposition: Patient medically stable for discharge pending SNF bed/insurance etc, hopefully 12/21.   Consultants:   Cardiology Nephrology Rehab MD  Procedures:   Cardiac cath 11/01/2019:   Prox RCA to Mid RCA lesion is 30% stenosed.  Prox Cx to Mid Cx lesion is 20% stenosed.  Mid Cx lesion is 50% stenosed.  Ost Cx to Prox Cx lesion is 40% stenosed.  Ost LAD to Mid LAD lesion is 30% stenosed.  1st Sept lesion is 60% stenosed.  There is  moderate left ventricular systolic dysfunction.  LV end diastolic pressure is moderately elevated.  The left ventricular ejection fraction is 35-45% by visual estimate.  There is mild (2+) mitral regurgitation.   1. Diffuse, mild non-obstructive disease in the LAD  2. Patent stent in the mid Circumflex with minimal restenosis. Moderate stenosis just beyond the stented segment of the mid Circumflex artery. This does not appear to be flow limiting. 3. Mild non-obstructive disease in the mid RCA 4. Moderate, global LV systolic dysfunction. 5. Elevated filling pressures  Recommendations; Medical management of CAD. Explore other causes of chest pain.   Antimicrobials:   None   Subjective:  Patient interviewed and examined along with her female RN in room.  Patient reports that she feels "good".  Denies complaints.  As per RN, no acute issues noted.  Objective:   Vitals:   11/04/19 2138 11/05/19 0526 11/05/19 0824 11/05/19 1120  BP: 105/72 98/61 105/88 117/88  Pulse: 78 76 78 86  Resp: 20     Temp: 98.1 F (36.7 C) 98 F (36.7 C)  98 F (36.7 C)  TempSrc: Oral Oral    SpO2: 98% 99%  95%  Weight:      Height:        General exam: Pleasant middle-age female, small built and thinly nourished sitting up comfortably in bed without distress. Respiratory system: Clear to auscultation.  No increased work of breathing. Cardiovascular system: S1 & S2 heard, RRR. No  JVD, murmurs, rubs, gallops or clicks. No pedal edema.  Off of telemetry, discontinued. Gastrointestinal system: Abdomen is nondistended, soft and nontender. No organomegaly or masses felt. Normal bowel sounds heard. Central nervous system: Alert and oriented. No focal neurological deficits. Extremities: Symmetric 5 x 5 power. Skin: No rashes, lesions or ulcers.  Small area of old bruise over right intrascapular area, possibly from previous fall Psychiatry: Judgement and insight appear normal. Mood & affect pleasant and  appropriate.     Data Reviewed:   I have personally reviewed following labs and imaging studies   CBC: Recent Labs  Lab 10/31/19 1451 11/01/19 0335 11/02/19 0309 11/03/19 1000  WBC 7.7 7.1 8.0 8.0  NEUTROABS 6.0  --   --   --   HGB 11.5* 9.4* 10.2* 10.1*  HCT 36.3 29.5* 31.4* 31.7*  MCV 108.7* 107.7* 106.1* 106.4*  PLT 210 178 202 194    Basic Metabolic Panel: Recent Labs  Lab 11/01/19 0335 11/02/19 0309 11/02/19 0902 11/03/19 1000  NA 140 139  --  137  K 5.1 6.6* 3.2* 4.9  CL 95* 96*  --  92*  CO2 30 22  --  25  GLUCOSE 90 103*  --  126*  BUN 80* 95*  --  62*  CREATININE 12.90* 14.56*  --  8.95*  CALCIUM 9.4 9.7  --  9.3  MG 2.8*  --   --   --   PHOS 7.3*  --   --  7.7*    Liver Function Tests: Recent Labs  Lab 10/31/19 1451 11/01/19 0335 11/03/19 1000  AST 20 17  --   ALT 16 14  --   ALKPHOS 99 91  --   BILITOT 0.5 0.3  --   PROT 6.8 5.7*  --   ALBUMIN 3.8 3.2* 3.4*    CBG: Recent Labs  Lab 11/04/19 2206 11/05/19 0616 11/05/19 1115  GLUCAP 89 93 99    Microbiology Studies:   Recent Results (from the past 240 hour(s))  Respiratory Panel by RT PCR (Flu A&B, Covid) - Nasopharyngeal Swab     Status: None   Collection Time: 10/31/19  8:55 PM   Specimen: Nasopharyngeal Swab  Result Value Ref Range Status   SARS Coronavirus 2 by RT PCR NEGATIVE NEGATIVE Final    Comment: (NOTE) SARS-CoV-2 target nucleic acids are NOT DETECTED. The SARS-CoV-2 RNA is generally detectable in upper respiratoy specimens during the acute phase of infection. The lowest concentration of SARS-CoV-2 viral copies this assay can detect is 131 copies/mL. A negative result does not preclude SARS-Cov-2 infection and should not be used as the sole basis for treatment or other patient management decisions. A negative result may occur with  improper specimen collection/handling, submission of specimen other than nasopharyngeal swab, presence of viral mutation(s) within  the areas targeted by this assay, and inadequate number of viral copies (<131 copies/mL). A negative result must be combined with clinical observations, patient history, and epidemiological information. The expected result is Negative. Fact Sheet for Patients:  PinkCheek.be Fact Sheet for Healthcare Providers:  GravelBags.it This test is not yet ap proved or cleared by the [place] FDA and  has been authorized for detection and/or diagnosis of SARS-CoV-2 by FDA under an Emergency Use Authorization (EUA). This EUA will remain  in effect (meaning this test can be used) for the duration of the COVID-19 declaration under Section 564(b)(1) of the Act, 21 U.S.C. section 360bbb-3(b)(1), unless the authorization is terminated or revoked sooner.  Influenza A by PCR NEGATIVE NEGATIVE Final   Influenza B by PCR NEGATIVE NEGATIVE Final    Comment: (NOTE) The Xpert Xpress SARS-CoV-2/FLU/RSV assay is intended as an aid in  the diagnosis of influenza from Nasopharyngeal swab specimens and  should not be used as a sole basis for treatment. Nasal washings and  aspirates are unacceptable for Xpert Xpress SARS-CoV-2/FLU/RSV  testing. Fact Sheet for Patients: PinkCheek.be Fact Sheet for Healthcare Providers: GravelBags.it This test is not yet approved or cleared by the [place] FDA and  has been authorized for detection and/or diagnosis of SARS-CoV-2 by  FDA under an Emergency Use Authorization (EUA). This EUA will remain  in effect (meaning this test can be used) for the duration of the  Covid-19 declaration under Section 564(b)(1) of the Act, 21  U.S.C. section 360bbb-3(b)(1), unless the authorization is  terminated or revoked. Performed at [facility], [place] [address] [place], [place] [address]      Radiology Studies:  No results found.   Scheduled Meds:    . aspirin EC  81 mg Oral Daily  . atorvastatin  40 mg Oral Daily  . buPROPion  150 mg Oral BID  . [START ON 11/06/2019] Chlorhexidine Gluconate Cloth  6 each Topical Q0600  . clopidogrel  75 mg Oral Daily  . darbepoetin (ARANESP) injection - DIALYSIS  200 mcg Intravenous Q Wed-HD  . doxercalciferol  1 mcg Intravenous Q M,W,F-HD  . ferric citrate  630 mg Oral TID WC  . ipratropium-albuterol  3 mL Nebulization TID  . metoprolol succinate  50 mg Oral Daily  . mometasone-formoterol  2 puff Inhalation BID  . multivitamin  1 tablet Oral QHS  . QUEtiapine  200 mg Oral QHS  . sodium chloride flush  3 mL Intravenous Q12H    Continuous Infusions:   . sodium chloride    . sodium chloride    . ferric gluconate (FERRLECIT/NULECIT) IV       LOS: 5 days     Vernell Leep, MD, [place], [facility]. [facility]    To contact the attending provider between 7A-7P or the covering provider during after hours 7P-7A, please log into the web site [email or web address] and access using universal  password for that web site. If you do not have the password, please call the hospital operator.  11/05/2019, 1:43 PM

## 2019-11-05 NOTE — Progress Notes (Addendum)
[facility] Progress Note   Subjective:   Patient seen and examined at bedside.  No new complaints.  Denies SOB, CP and edema.    Objective Vitals:   11/04/19 2138 11/05/19 0526 11/05/19 0824 11/05/19 1120  BP: 105/72 98/61 105/88 117/88  Pulse: 78 76 78 86  Resp: 20     Temp: 98.1 F (36.7 C) 98 F (36.7 C)  98 F (36.7 C)  TempSrc: Oral Oral    SpO2: 98% 99%  95%  Weight:      Height:       Physical Exam General:NAD, chronically ill appearing female, laying in bed Heart:RRR Lungs:mostly CTAB, faint crackles on R Abdomen:soft, NTND Extremities:no edema Dialysis Access: RU AVF +b   Filed Weights   11/02/19 0630 11/02/19 1021 11/03/19 0351  Weight: 74.6 kg 72.6 kg 72.6 kg    Intake/Output Summary (Last 24 hours) at 11/05/2019 1220 Last data filed at 11/05/2019 0800 Gross per 24 hour  Intake 720 ml  Output 100 ml  Net 620 ml    Additional Objective Labs: Basic Metabolic Panel: Recent Labs  Lab 11/01/19 0335 11/02/19 0309 11/02/19 0902 11/03/19 1000  NA 140 139  --  137  K 5.1 6.6* 3.2* 4.9  CL 95* 96*  --  92*  CO2 30 22  --  25  GLUCOSE 90 103*  --  126*  BUN 80* 95*  --  62*  CREATININE 12.90* 14.56*  --  8.95*  CALCIUM 9.4 9.7  --  9.3  PHOS 7.3*  --   --  7.7*   Liver Function Tests: Recent Labs  Lab 10/31/19 1451 11/01/19 0335 11/03/19 1000  AST 20 17  --   ALT 16 14  --   ALKPHOS 99 91  --   BILITOT 0.5 0.3  --   PROT 6.8 5.7*  --   ALBUMIN 3.8 3.2* 3.4*   No results for input(s): LIPASE, AMYLASE in the last 168 hours. CBC: Recent Labs  Lab 10/31/19 1451 11/01/19 0335 11/02/19 0309 11/03/19 1000  WBC 7.7 7.1 8.0 8.0  NEUTROABS 6.0  --   --   --   HGB 11.5* 9.4* 10.2* 10.1*  HCT 36.3 29.5* 31.4* 31.7*  MCV 108.7* 107.7* 106.1* 106.4*  PLT 210 178 202 234   Blood Culture No results found for: SDES, SPECREQUEST, CULT, REPTSTATUS  Cardiac Enzymes: No results for input(s): CKTOTAL, CKMB, CKMBINDEX, TROPONINI  in the last 168 hours. CBG: Recent Labs  Lab 11/04/19 1110 11/04/19 1617 11/04/19 2206 11/05/19 0616 11/05/19 1115  GLUCAP 123* 104* 89 93 99   Iron Studies: No results for input(s): IRON, TIBC, TRANSFERRIN, FERRITIN in the last 72 hours. Lab Results  Component Value Date   INR 1.1 10/18/2019   INR 1.1 07/23/2019   INR 1.11 06/06/2018   Studies/Results: No results found.  Medications: . sodium chloride    . sodium chloride    . ferric gluconate (FERRLECIT/NULECIT) IV     . aspirin EC  81 mg Oral Daily  . atorvastatin  40 mg Oral Daily  . buPROPion  150 mg Oral BID  . Chlorhexidine Gluconate Cloth  6 each Topical Q0600  . clopidogrel  75 mg Oral Daily  . darbepoetin (ARANESP) injection - DIALYSIS  200 mcg Intravenous Q Wed-HD  . doxercalciferol  1 mcg Intravenous Q M,W,F-HD  . ferric citrate  630 mg Oral TID WC  . ipratropium-albuterol  3 mL Nebulization TID  . metoprolol succinate  50 mg Oral Daily  . mometasone-formoterol  2 puff Inhalation BID  . multivitamin  1 tablet Oral QHS  . QUEtiapine  200 mg Oral QHS  . sodium chloride flush  3 mL Intravenous Q12H    Dialysis Orders: MWF at [facility] 3:30hr, 350/800, EDW 72.5kg, 2K/2Ca, UFP 4, AVF, no heparin - Hectoral 40mcg IV q HD - Venofer 50mg  IV q week - MIrcera 139mcg IV q 2 weeks (last 11/30)  Assessment/Plan: 1. CP/SOB- Improved with HD. cath with medical management recommended per 12/16 report.   2. ESRD- MWF HD. K 4.9 pre HD Friday. Orders written for HD tomorrow per regular schedule.   3. Anemia- Hgb 10.1; on aranesp 234mcg qwk(Wed) 4. Secondary hyperparathyroidism- Ca in goal. P high - missing binders.  Continue VDRA/binders. 5.HTN/volume- Blood pressure well controlled.  Fluid status improved. At EDW post HD Friday. 6. Nutrition- renal diet/vits 7.Severe COPD- chronic O2, likely contributory to SOB 8. Deconditioning./falls - ongoing issue - Plan to go to SNF, awaiting placement - d/w in [facility] But  still with falls.  9. Mild hyperkalemia - resolved with HD  Jen Mow, PA-C [facility] Pager: [phone] 11/05/2019,12:20 PM  LOS: 5 days   I have seen and examined this patient and agree with plan and assessment in the above note with renal recommendations/intervention highlighted. Doing well.  Awaiting SNF placement.  [name] A Lavra Imler,MD 11/05/2019 1:51 PM

## 2019-11-06 LAB — RENAL FUNCTION PANEL
Albumin: 3.2 g/dL — ABNORMAL LOW (ref 3.5–5.0)
Anion gap: 18 — ABNORMAL HIGH (ref 5–15)
BUN: 82 mg/dL — ABNORMAL HIGH (ref 8–23)
CO2: 24 mmol/L (ref 22–32)
Calcium: 9.1 mg/dL (ref 8.9–10.3)
Chloride: 97 mmol/L — ABNORMAL LOW (ref 98–111)
Creatinine, Ser: 10.71 mg/dL — ABNORMAL HIGH (ref 0.44–1.00)
GFR calc Af Amer: 4 mL/min — ABNORMAL LOW (ref >60)
GFR calc non Af Amer: 3 mL/min — ABNORMAL LOW (ref >60)
Glucose, Bld: 93 mg/dL (ref 70–99)
Phosphorus: 6.2 mg/dL — ABNORMAL HIGH (ref 2.5–4.6)
Potassium: 4.1 mmol/L (ref 3.5–5.1)
Sodium: 139 mmol/L (ref 135–145)

## 2019-11-06 LAB — CBC
HCT: 30.3 % — ABNORMAL LOW (ref 36.0–46.0)
Hemoglobin: 9.7 g/dL — ABNORMAL LOW (ref 12.0–15.0)
MCH: 33.9 pg (ref 26.0–34.0)
MCHC: 32 g/dL (ref 30.0–36.0)
MCV: 105.9 fL — ABNORMAL HIGH (ref 80.0–100.0)
Platelets: 238 10*3/uL (ref 150–400)
RBC: 2.86 MIL/uL — ABNORMAL LOW (ref 3.87–5.11)
RDW: 15.1 % (ref 11.5–15.5)
WBC: 7.3 10*3/uL (ref 4.0–10.5)
nRBC: 0 % (ref 0.0–0.2)

## 2019-11-06 LAB — GLUCOSE, CAPILLARY
Glucose-Capillary: 106 mg/dL — ABNORMAL HIGH (ref 70–99)
Glucose-Capillary: 131 mg/dL — ABNORMAL HIGH (ref 70–99)

## 2019-11-06 MED ORDER — LIDOCAINE HCL (PF) 1 % IJ SOLN: 5.0000 mL | INTRAMUSCULAR | Status: DC | PRN

## 2019-11-06 MED ORDER — ALTEPLASE 2 MG IJ SOLR: 2.0000 mg | Freq: Once | INTRAMUSCULAR | Status: DC | PRN

## 2019-11-06 MED ORDER — HEPARIN SODIUM (PORCINE) 1000 UNIT/ML DIALYSIS: 1000.0000 [IU] | INTRAMUSCULAR | Status: DC | PRN

## 2019-11-06 MED ORDER — SODIUM CHLORIDE 0.9 % IV SOLN: 100.0000 mL | INTRAVENOUS | Status: DC | PRN

## 2019-11-06 MED ORDER — DOXERCALCIFEROL 4 MCG/2ML IV SOLN
INTRAVENOUS | Status: AC
  Administered 2019-11-06: 1 ug via INTRAVENOUS
  Filled 2019-11-06: qty 2

## 2019-11-06 MED ORDER — LIDOCAINE-PRILOCAINE 2.5-2.5 % EX CREA: 1.0000 "application " | TOPICAL_CREAM | CUTANEOUS | Status: DC | PRN

## 2019-11-06 MED ORDER — PENTAFLUOROPROP-TETRAFLUOROETH EX AERO: 1.0000 "application " | INHALATION_SPRAY | CUTANEOUS | Status: DC | PRN

## 2019-11-06 MED ORDER — NITROGLYCERIN 0.4 MG SL SUBL: 0.4000 mg | SUBLINGUAL_TABLET | SUBLINGUAL | Status: DC | PRN

## 2019-11-06 NOTE — Discharge Summary (Signed)
Physician Discharge Summary  Kari Robinson YHC:[id number] DOB: 05-Oct-1951  PCP: Tsosie Billing, MD (Inactive)  Admitted from: Home Discharged to: SNF  Admit date: 10/31/2019 Discharge date: 11/06/2019  Recommendations for Outpatient Follow-up:   Follow-up Information    MD at SNF. Schedule an appointment as soon as possible for a visit.   Why: To be seen in 2 to 3 days.  Follow labs that will be drawn across HD.  Periodically follow EKG to monitor QTC.       Hemodialysis Center Follow up on 11/08/2019.   Why: Keep regular hemodialysis appointments on Mondays, Wednesdays and Fridays.       Tsosie Billing, MD. Schedule an appointment as soon as possible for a visit.   Specialty: Internal Medicine Why: Upon discharge from SNF. Contact information: [place] [address] [phone]        Minus Breeding, MD .   Specialty: Cardiology Contact information: [address] [place] [address] [phone]          Recommend repeating chest x-ray in 4 to 6 weeks to ensure resolution of abnormal findings.  Home Health: N/A Equipment/Devices: TBD at SNF  Discharge Condition: Improved and stable CODE STATUS: Full Diet recommendation: Heart healthy & diabetic diet.  Discharge Diagnoses:  Active Problems:   Essential hypertension   Coronary atherosclerosis   Normocytic normochromic anemia   DM (diabetes mellitus), type 2 with renal complications ([name])   Chronic systolic CHF (congestive heart failure) ([name])   COPD (chronic obstructive pulmonary disease) ([name])   Type II diabetes mellitus with renal manifestations ([name])   HLD (hyperlipidemia)   ESRD (end stage renal disease) on dialysis ([name])   Chest pain with moderate risk for cardiac etiology, hx CAD   Elevated troponin   Hyperkalemia   Weakness   Unstable angina [facility])   Brief Summary: 68 year old female with PMH of CAD status post stents, NSTEMI 09/2017,  ischemic cardiomyopathy, chronic systolic CHF (EF improved from 25% to 45-50%), essential hypertension, hyperlipidemia, COPD on home oxygen 3 L/min, lung cancer s/p left lung resection 2017, ESRD on MWF HD, PAF, tobacco abuse, recently admitted 12/3-12/7 for chest pain and elevated troponin, cardiology evaluated and recommended medical management, presented to [facility] ED on 12/15 due to chest pain, some relief with sublingual NTG, at times worse with deep inspiration or touching her chest and multiple falls day prior to admission.  Cardiology was consulted, s/p cardiac cath 12/16 without high-grade obstruction, medical management recommended.  Post cath 12/16 developed acute on chronic hypoxic respiratory failure due to COPD exacerbation and fluid overload, unable to get HD right away, managed with IV steroids and BiPAP, had HD 12/17 with improvement in respiratory status.   Assessment & Plan:   CAD s/p stents/chest pain/mild troponin elevation  Cardiology was consulted.  Patient has extensive cardiac history.  Recent admission for similar presentation i.e. chest pain and elevated troponins.  Cardiology felt that she had chest pain with typical and atypical features and was high risk given prior stents.  Underwent cardiac cath 12/16 with detailed report as below, did not require any intervention, medical management recommended and consider alternate causes.  Chest pain resolved without recurrence.  ?  GI versus musculoskeletal etiology of chest pain.  Continue aspirin, Plavix, statins and metoprolol.  Chest pain has resolved without recurrence over the last couple of days. Stable.  Acute on chronic hypoxic respiratory failure/COPD exacerbation/acute on chronic systolic CHF.  On afternoon of 12/16 post-cath developed  worsening dyspnea and hypoxia.  Started on IV Solu-Medrol, bronchodilator nebulizations, required BiPAP overnight, unable to get HD yesterday due to scheduling issues from  emergencies.  Had HD 12/17.  Improved and looks like she is saturating in the high 90s on room air.  Monitor closely.  I suspect her worsening dyspnea was more from decompensated CHF rather than COPD.  Her acute respiratory issues have resolved and she is back on her home dose of oxygen.  Discontinued IV Solu-Medrol.  Stable on prior home level of oxygen.  ESRD on MWF hemodialysis  Nephrology input appreciated.  Reportedly not able to make the last HD treatment as person who transported her was sick.  Patient underwent dialysis on 12/17, 12/18 and 12/21.  Hyperkalemia  Appears to be a recurrent problem, had this during previous admission.  Resolved after dialysis. Potassium 4.1 today.  Secondary hyperparathyroidism  Management per nephrology.  Deconditioning/frequent falls  Patient refused SNF during prior admission.  Do not think that she is safe to return home.  Awaiting SNF placement for short-term rehab.  Tobacco abuse  Recently quit smoking in September 2020, encouraged and congratulated.  Essential hypertension  Controlled.  Volume management across dialysis.  Continue Toprol-XL.  Hyperlipidemia  Continue statins.  Anemia in ESRD  Her baseline hemoglobin is likely in the 9-10 range.  Aranesp per nephrology.  Continue ferric citrate.  Hemoglobin 9.7 today.  Has macrocytic anemia. Outpatient evaluation regarding macrocytosis.  Depression  Stable on Wellbutrin and Seroquel, continue.  EKG 12/18: QTC mildly prolonged at 479 ms but patient has BBB morphology and not sure if this is totally accurate.  Follow EKG periodically for QTC monitoring.  As needed hydroxyzine added in the hospital for anxiety but will not be discharged on this.  Type II DM with renal manifestations  A1c 5.1.  DC SSI.  Reasonable inpatient control despite IV steroids.    Consultants:   Cardiology Nephrology Rehab MD  Procedures:   Cardiac cath  11/01/2019:   Prox RCA to Mid RCA lesion is 30% stenosed.  Prox Cx to Mid Cx lesion is 20% stenosed.  Mid Cx lesion is 50% stenosed.  Ost Cx to Prox Cx lesion is 40% stenosed.  Ost LAD to Mid LAD lesion is 30% stenosed.  1st Sept lesion is 60% stenosed.  There is moderate left ventricular systolic dysfunction.  LV end diastolic pressure is moderately elevated.  The left ventricular ejection fraction is 35-45% by visual estimate.  There is mild (2+) mitral regurgitation.  1. Diffuse, mild non-obstructive disease in the LAD  2. Patent stent in the mid Circumflex with minimal restenosis. Moderate stenosis just beyond the stented segment of the mid Circumflex artery. This does not appear to be flow limiting. 3. Mild non-obstructive disease in the mid RCA 4. Moderate, global LV systolic dysfunction. 5. Elevated filling pressures  Recommendations; Medical management of CAD. Explore other causes of chest pain.    Discharge Instructions  Discharge Instructions    (HEART FAILURE PATIENTS) Call MD:  Anytime you have any of the following symptoms: 1) 3 pound weight gain in 24 hours or 5 pounds in 1 week 2) shortness of breath, with or without a dry hacking cough 3) swelling in the hands, feet or stomach 4) if you have to sleep on extra pillows at night in order to breathe.   Complete by: As directed    Call MD for:  difficulty breathing, headache or visual disturbances   Complete by: As directed  Call MD for:  extreme fatigue   Complete by: As directed    Call MD for:  persistant dizziness or light-headedness   Complete by: As directed    Call MD for:  persistant nausea and vomiting   Complete by: As directed    Call MD for:  severe uncontrolled pain   Complete by: As directed    Call MD for:  temperature >100.4   Complete by: As directed    Diet - low sodium heart healthy   Complete by: As directed    Diet Carb Modified   Complete by: As directed    Increase activity  slowly   Complete by: As directed        Medication List    TAKE these medications   albuterol 108 (90 Base) MCG/ACT inhaler Commonly known as: VENTOLIN HFA Inhale 2 puffs into the lungs every 4 (four) hours as needed for wheezing or shortness of breath.   aspirin EC 81 MG tablet Take 81 mg by mouth daily.   atorvastatin 40 MG tablet Commonly known as: LIPITOR Take 40 mg by mouth daily.   Auryxia 1 GM 210 MG(Fe) tablet Generic drug: ferric citrate Take 420-630 mg by mouth See admin instructions. Take 3 tablets (630 mg) by mouth three times daily with meals and 2 tablets (420 mg) by mouth with each snack   b complex-vitamin c-folic acid 0.8 MG Tabs tablet Take 1 tablet by mouth See admin instructions. Take 1 tablet by mouth in the morning on Sun/Tues/Thurs/Sat and 1 tablet after dialysis on Mon/Wed/Fri   Boost Glucose Control Liqd Take 1 Can by mouth 2 (two) times daily.   budesonide-formoterol 160-4.5 MCG/ACT inhaler Commonly known as: SYMBICORT Inhale 2 puffs into the lungs 2 (two) times daily.   buPROPion 150 MG 12 hr tablet Commonly known as: WELLBUTRIN SR Take 150 mg by mouth 2 (two) times daily.   clopidogrel 75 MG tablet Commonly known as: PLAVIX Take 75 mg by mouth daily.   ipratropium-albuterol 0.5-2.5 (3) MG/3ML Soln Commonly known as: DUONEB Take 3 mLs by nebulization every 6 (six) hours as needed. What changed: reasons to take this   lidocaine-prilocaine cream Commonly known as: EMLA Apply 1 application topically every Monday, Wednesday, and Friday with hemodialysis. Prior to dialysis   metoprolol succinate 50 MG 24 hr tablet Commonly known as: TOPROL-XL Take 50 mg by mouth daily. Take with or immediately following a meal.   nitroGLYCERIN 0.4 MG SL tablet Commonly known as: NITROSTAT Place 1 tablet (0.4 mg total) under the tongue every 5 (five) minutes as needed for chest pain.   OXYGEN Inhale 3 L/min into the lungs continuous.   QUEtiapine 200  MG tablet Commonly known as: SEROQUEL Take 200 mg by mouth at bedtime.      Allergies  Allergen Reactions  . Citalopram Hydrobromide Hives  . Morphine And Related Itching  . Tape Other (See Comments)    PLASTIC, CLEAR TAPE STICKS TO THE SKIN AND POSSIBLY TEARS IT- PLEASE USE PAPER TAPE      Procedures/Studies:  CARDIAC CATHETERIZATION  Result Date: 11/01/2019  Prox RCA to Mid RCA lesion is 30% stenosed.  Prox Cx to Mid Cx lesion is 20% stenosed.  Mid Cx lesion is 50% stenosed.  Ost Cx to Prox Cx lesion is 40% stenosed.  Ost LAD to Mid LAD lesion is 30% stenosed.  1st Sept lesion is 60% stenosed.  There is moderate left ventricular systolic dysfunction.  LV end diastolic pressure is  moderately elevated.  The left ventricular ejection fraction is 35-45% by visual estimate.  There is mild (2+) mitral regurgitation.  1. Diffuse, mild non-obstructive disease in the LAD 2. Patent stent in the mid Circumflex with minimal restenosis. Moderate stenosis just beyond the stented segment of the mid Circumflex artery. This does not appear to be flow limiting. 3. Mild non-obstructive disease in the mid RCA 4. Moderate, global LV systolic dysfunction. 5. Elevated filling pressures Recommendations; Medical management of CAD. Explore other causes of chest pain.   DG Chest Port 1 View  Result Date: 11/01/2019 CLINICAL DATA:  Shortness of breath EXAM: PORTABLE CHEST 1 VIEW COMPARISON:  10/31/2019 FINDINGS: Cardiomegaly. There is hyperinflation of the lungs compatible with COPD. Aortic atherosclerosis. Airspace opacities bilaterally, most confluent in the right mid and lower lung. Appearance is concerning for pneumonia. Diffuse interstitial prominence could also be related to infection or interstitial edema. IMPRESSION: Cardiomegaly, COPD.  Possible interstitial edema. Confluent airspace disease in the right mid and lower lung concerning for pneumonia. Electronically Signed   By: Rolm Baptise M.D.    On: 11/01/2019 17:41   DG Chest Portable 1 View  Result Date: 10/31/2019 CLINICAL DATA:  Chest pain and shortness of breath EXAM: PORTABLE CHEST 1 VIEW COMPARISON:  October 19, 2019 FINDINGS: There is slight scarring in the bases. There is no edema or consolidation. Heart is upper normal in size with pulmonary vascularity normal. No adenopathy. There is aortic atherosclerosis. There are foci of calcification in each subclavian and axillary artery. No bone lesions. IMPRESSION: Mild bibasilar scarring. No edema or consolidation. Stable cardiac silhouette. No adenopathy. There is extensive arterial vascular calcification. Aortic Atherosclerosis (ICD10-I70.0). Electronically Signed   By: Lowella Grip III M.D.   On: 10/31/2019 15:59     Subjective: Patient seen this morning at dialysis. Denies complaints. No chest pain or dyspnea. Aware that we are waiting for SNF bed/insurance clearance for discharge.  Discharge Exam:  Vitals:   11/06/19 0930 11/06/19 1000 11/06/19 1209 11/06/19 1456  BP: (!) 85/51 (!) 97/55 112/60   Pulse: 70 74 75   Resp:   18   Temp:   (!) 97.5 F (36.4 C)   TempSrc:   Oral   SpO2:   99% 100%  Weight:      Height:       General exam: Pleasant middle-age female, small built and thinly nourished lying comfortably supine in bed undergoing dialysis. Respiratory system: Clear to auscultation. No increased work of breathing. Cardiovascular system: S1 & S2 heard, RRR. No JVD, murmurs, rubs, gallops or clicks. No pedal edema.  Off of telemetry, discontinued. Gastrointestinal system: Abdomen is nondistended, soft and nontender. No organomegaly or masses felt. Normal bowel sounds heard. Central nervous system: Alert and oriented. No focal neurological deficits. Extremities: Symmetric 5 x 5 power. Skin: No rashes, lesions or ulcers.  Small area of old bruise over right intrascapular area, possibly from previous fall Psychiatry: Judgement and insight appear normal. Mood &  affect pleasant and appropriate.    The results of significant diagnostics from this hospitalization (including imaging, microbiology, ancillary and laboratory) are listed below for reference.     Microbiology: Recent Results (from the past 240 hour(s))  Respiratory Panel by RT PCR (Flu A&B, Covid) - Nasopharyngeal Swab     Status: None   Collection Time: 10/31/19  8:55 PM   Specimen: Nasopharyngeal Swab  Result Value Ref Range Status   SARS Coronavirus 2 by RT PCR NEGATIVE NEGATIVE Final  Comment: (NOTE) SARS-CoV-2 target nucleic acids are NOT DETECTED. The SARS-CoV-2 RNA is generally detectable in upper respiratoy specimens during the acute phase of infection. The lowest concentration of SARS-CoV-2 viral copies this assay can detect is 131 copies/mL. A negative result does not preclude SARS-Cov-2 infection and should not be used as the sole basis for treatment or other patient management decisions. A negative result may occur with  improper specimen collection/handling, submission of specimen other than nasopharyngeal swab, presence of viral mutation(s) within the areas targeted by this assay, and inadequate number of viral copies (<131 copies/mL). A negative result must be combined with clinical observations, patient history, and epidemiological information. The expected result is Negative. Fact Sheet for Patients:  PinkCheek.be Fact Sheet for Healthcare Providers:  GravelBags.it This test is not yet ap proved or cleared by the [place] FDA and  has been authorized for detection and/or diagnosis of SARS-CoV-2 by FDA under an Emergency Use Authorization (EUA). This EUA will remain  in effect (meaning this test can be used) for the duration of the COVID-19 declaration under Section 564(b)(1) of the Act, 21 U.S.C. section 360bbb-3(b)(1), unless the authorization is terminated or revoked sooner.    Influenza A by  PCR NEGATIVE NEGATIVE Final   Influenza B by PCR NEGATIVE NEGATIVE Final    Comment: (NOTE) The Xpert Xpress SARS-CoV-2/FLU/RSV assay is intended as an aid in  the diagnosis of influenza from Nasopharyngeal swab specimens and  should not be used as a sole basis for treatment. Nasal washings and  aspirates are unacceptable for Xpert Xpress SARS-CoV-2/FLU/RSV  testing. Fact Sheet for Patients: PinkCheek.be Fact Sheet for Healthcare Providers: GravelBags.it This test is not yet approved or cleared by the [place] FDA and  has been authorized for detection and/or diagnosis of SARS-CoV-2 by  FDA under an Emergency Use Authorization (EUA). This EUA will remain  in effect (meaning this test can be used) for the duration of the  Covid-19 declaration under Section 564(b)(1) of the Act, 21  U.S.C. section 360bbb-3(b)(1), unless the authorization is  terminated or revoked. Performed at [facility], [place] [address], [place], [place] [address]   SARS CORONAVIRUS 2 (TAT 6-24 HRS) Nasopharyngeal Nasopharyngeal Swab     Status: None   Collection Time: 11/05/19 10:46 AM   Specimen: Nasopharyngeal Swab  Result Value Ref Range Status   SARS Coronavirus 2 NEGATIVE NEGATIVE Final    Comment: (NOTE) SARS-CoV-2 target nucleic acids are NOT DETECTED. The SARS-CoV-2 RNA is generally detectable in upper and lower respiratory specimens during the acute phase of infection. Negative results do not preclude SARS-CoV-2 infection, do not rule out co-infections with other pathogens, and should not be used as the sole basis for treatment or other patient management decisions. Negative results must be combined with clinical observations, patient history, and epidemiological information. The expected result is Negative. Fact Sheet for Patients: SugarRoll.be Fact Sheet for Healthcare  Providers: https://www.woods-mathews.com/ This test is not yet approved or cleared by the [place] FDA and  has been authorized for detection and/or diagnosis of SARS-CoV-2 by FDA under an Emergency Use Authorization (EUA). This EUA will remain  in effect (meaning this test can be used) for the duration of the COVID-19 declaration under Section 56 4(b)(1) of the Act, 21 U.S.C. section 360bbb-3(b)(1), unless the authorization is terminated or revoked sooner. Performed at [facility], [place] [address] [place], [place] [address]      Labs: CBC: Recent Labs  Lab 10/31/19 1451  11/01/19 0335 11/02/19 0309 11/03/19 1000 11/06/19 0805  WBC 7.7 7.1 8.0 8.0 7.3  NEUTROABS 6.0  --   --   --   --   HGB 11.5* 9.4* 10.2* 10.1* 9.7*  HCT 36.3 29.5* 31.4* 31.7* 30.3*  MCV 108.7* 107.7* 106.1* 106.4* 105.9*  PLT 210 178 202 234 573    Basic Metabolic Panel: Recent Labs  Lab 11/01/19 0058 11/01/19 0335 11/02/19 0309 11/02/19 0902 11/03/19 1000 11/06/19 0805  NA 140 140 139  --  137 139  K 5.5* 5.1 6.6* 3.2* 4.9 4.1  CL 95* 95* 96*  --  92* 97*  CO2 27 30 22   --  25 24  GLUCOSE 89 90 103*  --  126* 93  BUN 77* 80* 95*  --  62* 82*  CREATININE 12.72* 12.90* 14.56*  --  8.95* 10.71*  CALCIUM 9.1 9.4 9.7  --  9.3 9.1  MG  --  2.8*  --   --   --   --   PHOS  --  7.3*  --   --  7.7* 6.2*    Liver Function Tests: Recent Labs  Lab 10/31/19 1451 11/01/19 0335 11/03/19 1000 11/06/19 0805  AST 20 17  --   --   ALT 16 14  --   --   ALKPHOS 99 91  --   --   BILITOT 0.5 0.3  --   --   PROT 6.8 5.7*  --   --   ALBUMIN 3.8 3.2* 3.4* 3.2*    CBG: Recent Labs  Lab 11/05/19 0616 11/05/19 1115 11/05/19 1634 11/05/19 2151 11/06/19 0615  GLUCAP 93 99 104* 116* 106*     Time coordinating discharge: 40 minutes  SIGNED:  Vernell Leep, MD, FACP, [facility]  To contact the attending provider between 7A-7P or the covering provider during  after hours 7P-7A, please log into the web site [email or web address] and access using universal [place] password for that web site. If you do not have the password, please call the hospital operator.

## 2019-11-06 NOTE — Progress Notes (Signed)
Report called to Steele Sizer at [facility], [phone]. Pt picked up by [facility], IV discontinued, O2 monitoring discontinued, pt VSS, all belongings sent with pt including, clothing, black purse.

## 2019-11-06 NOTE — TOC Transition Note (Signed)
Transition of Care [facility]) - CM/SW Discharge Note   Patient Details  Name: Kari Robinson MRN: [id number] Date of Birth: 01-12-1951  Transition of [facility]) CM/SW Contact:  Eileen [facility], LCSW Phone Number: 11/06/2019, 3:13 PM   Clinical Narrative: Clinical Social Worker facilitated patient discharge including contacting patient family and facility to confirm patient discharge plans.  Clinical information faxed to facility and family agreeable with plan.  CSW arranged ambulance transport via [facility] to [place] .  RN to call [phone] for report prior to discharge.   Final next level of care: Skilled Nursing Facility Barriers to Discharge: No Barriers Identified   Patient Goals and CMS Choice Patient states their goals for this hospitalization and ongoing recovery are:: "to get better "   Choice offered to / list presented to : Patient  Discharge Placement PASRR number recieved: 11/06/19            [facility] bed at: [facility] Patient to be transferred to facility by: [facility] Name of family member notified: pt alert and oriented Patient and family notified of of transfer: 11/06/19  Discharge Plan and Services                                     Social Determinants of Health (SDOH) Interventions     Readmission Risk Interventions Readmission Risk Prevention Plan 07/04/2019  Transportation Screening Complete  Medication Review [employer]) Complete  HRI or Home Care Consult Complete  Palliative Care Screening Not Applicable  Some recent data might be hidden

## 2019-11-06 NOTE — Progress Notes (Signed)
OT Cancellation Note  Patient Details Name: Kari Robinson MRN: [id number] DOB: 10-13-1951   Cancelled Treatment:    Reason Eval/Treat Not Completed: Patient at procedure or test/ unavailable;Other (comment) pt off unit at HD, will check back as time allows.   Lanier Clam., COTA/L Acute Rehabilitation Services [phone] [place] 11/06/2019, 8:37 AM

## 2019-11-06 NOTE — Progress Notes (Signed)
Physical Therapy Treatment Patient Details Name: Kari Robinson MRN: [id number] DOB: 04-25-1951 Today's Date: 11/06/2019    History of Present Illness Kari Robinson is a 68 y.o. female with medical history significant of hypertension, hyperlipidemia, ESRD on HD(M/W/F), CAD s/p stents, COPD, oxygen dependent on 3 L of nasal cannula oxygen, tobacco abuse, and lung cancer s/p resection.  She presents with complaints chest pain and shortness of breath.  Also reporting she has been having intermittent leg jerking.    PT Comments    Pt making good progress today.  Significant improvement in balance and was able to take a few steps to the chair with min A of 2.  No episodes of pushing back today and only minimal leg jerking but no buckling.  Cont POC.    Follow Up Recommendations  CIR     Equipment Recommendations  None recommended by PT    Recommendations for Other Services       Precautions / Restrictions Precautions Precautions: Fall    Mobility  Bed Mobility Overal bed mobility: Needs Assistance Bed Mobility: Supine to Sit Rolling: Min guard   Supine to sit: Min guard;HOB elevated        Transfers Overall transfer level: Needs assistance Equipment used: Rolling walker (2 wheeled) Transfers: Sit to/from [facility] Sit to Stand: Min assist;+2 safety/equipment;From elevated surface Stand pivot transfers: Min assist;+2 safety/equipment;From elevated surface       General transfer comment: pt with fear of falling; had assist of 2 for safety; cued for safe hand placement; performed sit to stand x 2  Ambulation/Gait Ambulation/Gait assistance: Min assist;+2 physical assistance Gait Distance (Feet): 3 Feet Assistive device: Rolling walker (2 wheeled) Gait Pattern/deviations: Shuffle;Decreased stride length     General Gait Details: steps to chair; assist with RW; mild knee jerking but no true buckling   Stairs             Wheelchair  Mobility    Modified Rankin (Stroke Patients Only)       Balance Overall balance assessment: Needs assistance Sitting-balance support: Feet supported;Bilateral upper extremity supported Sitting balance-Leahy Scale: Good     Standing balance support: Bilateral upper extremity supported;During functional activity Standing balance-Leahy Scale: Fair                              Cognition Arousal/Alertness: Awake/alert Behavior During Therapy: WFL for tasks assessed/performed Overall Cognitive Status: No family/caregiver present to determine baseline cognitive functioning                                 General Comments: s/p dialysis today      Exercises      General Comments General comments (skin integrity, edema, etc.): vss      Pertinent Vitals/Pain Pain Assessment: No/denies pain    Home Living                      Prior Function            PT Goals (current goals can now be found in the care plan section) Progress towards PT goals: Progressing toward goals    Frequency    Min 3X/week      PT Plan Current plan remains appropriate    Co-evaluation              AM-PAC PT "6 Clicks"  Mobility   Outcome Measure  Help needed turning from your back to your side while in a flat bed without using bedrails?: None Help needed moving from lying on your back to sitting on the side of a flat bed without using bedrails?: None Help needed moving to and from a bed to a chair (including a wheelchair)?: A Little Help needed standing up from a chair using your arms (e.g., wheelchair or bedside chair)?: A Little Help needed to walk in hospital room?: A Lot Help needed climbing 3-5 steps with a railing? : A Lot 6 Click Score: 18    End of Session Equipment Utilized During Treatment: Gait belt;Oxygen Activity Tolerance: Patient tolerated treatment well Patient left: with call bell/phone within reach;in chair;with chair alarm  set Nurse Communication: Mobility status PT Visit Diagnosis: Unsteadiness on feet (R26.81);Other abnormalities of gait and mobility (R26.89);Muscle weakness (generalized) (M62.81);Repeated falls (R29.6);Ataxic gait (R26.0);Difficulty in walking, not elsewhere classified (R26.2)     Time: 1834-3735 PT Time Calculation (min) (ACUTE ONLY): 23 min  Charges:  $Gait Training: 8-22 mins $Therapeutic Activity: 8-22 mins                     Maggie Font, PT Acute Rehab Services Pager [phone] [facility] [phone] [facility] [phone]    Karlton Lemon 11/06/2019, 4:45 PM

## 2019-11-06 NOTE — Discharge Instructions (Signed)

## 2019-11-06 NOTE — Progress Notes (Signed)
[facility] ROUNDING NOTE   Subjective:   68 year old lady history of coronary disease status post stents NSTEMI 10/05/2017 ischemic cardiomyopathy chronic systolic heart failure EF improved from 25 to 45%.  History of essential hypertension hyperlipidemia COPD on home oxygen 3 L history of lung cancer status post left lung resection 2017 end-stage renal disease Monday Wednesday Friday dialysis paroxysmal atrial fibrillation history of tobacco abuse.  Previous admission was on 10/19/2019 to  10/23/2019 with chest pain evaluated by cardiology and advised on medical management.  Return to the emergency room 10/31/2019 with chest pain relieved with sublingual nitroglycerin but worse with inspiration and worse on palpation of chest wall.  Cardiology was consulted status post cardiac cath 11/01/2019 revealing high-grade obstruction again medical management was recommended.  Hospital course has been complicated by chronic hypoxic respiratory failure due to COPD.  She underwent dialysis 11/02/2019 with improvement in respiratory status and is agreeable for CIR/SNF.  Blood pressure 97/55 pulse 74 temperature 98.8 O2 sats 100% 3 L nasal cannula  Sodium 139 potassium 4.1 chloride 97 CO2 24 BUN 82 creatinine 10.7 glucose 93 calcium 9.1 phosphorus 6.2 albumin 3.2  WBC 7.3 hemoglobin 9.7 platelets 238  Aspirin 81 mg daily Lipitor 40 mg daily Wellbutrin 150 mg twice daily Plavix 75 mg daily, darbepoetin 200 mcg every Wednesday last dose 11/01/2019 Hectorol 1 mcg Monday Wednesday Friday, Auryxia 3 times daily with meals, metoprolol 24 hours 50 mg daily multivitamins 1 daily, Seroquel 200 mg nightly.  Last dialysis treatment 11/03/2019 revealed 1 L removed.  Prior to that she had dialysis 11/02/2019 with 2.7 L removed.    Objective:  Vital signs in last 24 hours:  Temp:  [97.7 F (36.5 C)-98.8 F (37.1 C)] 98.8 F (37.1 C) (12/21 0730) Pulse Rate:  [69-86] 74 (12/21 1000) Resp:  [18] 18  (12/21 0730) BP: (85-144)/(41-88) 97/55 (12/21 1000) SpO2:  [95 %-100 %] 100 % (12/21 0730) Weight:  [73.8 kg] 73.8 kg (12/21 0730)  Weight change:  Filed Weights   11/02/19 1021 11/03/19 0351 11/06/19 0730  Weight: 72.6 kg 72.6 kg 73.8 kg    Intake/Output: I/O last 3 completed shifts: In: 480 [P.O.:480] Out: 50 [Urine:50]   Intake/Output this shift:  No intake/output data recorded.  General:NAD, chronically ill appearing female, laying in bed Heart:RRR Lungs:mostly CTAB, faint crackles on R Abdomen:soft, NTND Extremities:no edema Dialysis Access: RU AVF +b    Basic Metabolic Panel: Recent Labs  Lab 11/01/19 0058 11/01/19 0335 11/02/19 0309 11/02/19 0902 11/03/19 1000 11/06/19 0805  NA 140 140 139  --  137 139  K 5.5* 5.1 6.6* 3.2* 4.9 4.1  CL 95* 95* 96*  --  92* 97*  CO2 27 30 22   --  25 24  GLUCOSE 89 90 103*  --  126* 93  BUN 77* 80* 95*  --  62* 82*  CREATININE 12.72* 12.90* 14.56*  --  8.95* 10.71*  CALCIUM 9.1 9.4 9.7  --  9.3 9.1  MG  --  2.8*  --   --   --   --   PHOS  --  7.3*  --   --  7.7* 6.2*    Liver Function Tests: Recent Labs  Lab 10/31/19 1451 11/01/19 0335 11/03/19 1000 11/06/19 0805  AST 20 17  --   --   ALT 16 14  --   --   ALKPHOS 99 91  --   --   BILITOT 0.5 0.3  --   --  PROT 6.8 5.7*  --   --   ALBUMIN 3.8 3.2* 3.4* 3.2*   No results for input(s): LIPASE, AMYLASE in the last 168 hours. No results for input(s): AMMONIA in the last 168 hours.  CBC: Recent Labs  Lab 10/31/19 1451 11/01/19 0335 11/02/19 0309 11/03/19 1000 11/06/19 0805  WBC 7.7 7.1 8.0 8.0 7.3  NEUTROABS 6.0  --   --   --   --   HGB 11.5* 9.4* 10.2* 10.1* 9.7*  HCT 36.3 29.5* 31.4* 31.7* 30.3*  MCV 108.7* 107.7* 106.1* 106.4* 105.9*  PLT 210 178 202 234 238    Cardiac Enzymes: No results for input(s): CKTOTAL, CKMB, CKMBINDEX, TROPONINI in the last 168 hours.  BNP: Invalid input(s): POCBNP  CBG: Recent Labs  Lab 11/05/19 0616  11/05/19 1115 11/05/19 1634 11/05/19 2151 11/06/19 0615  GLUCAP 93 99 104* 116* 106*    Microbiology: Results for orders placed or performed during the hospital encounter of 10/31/19  Respiratory Panel by RT PCR (Flu A&B, Covid) - Nasopharyngeal Swab     Status: None   Collection Time: 10/31/19  8:55 PM   Specimen: Nasopharyngeal Swab  Result Value Ref Range Status   SARS Coronavirus 2 by RT PCR NEGATIVE NEGATIVE Final    Comment: (NOTE) SARS-CoV-2 target nucleic acids are NOT DETECTED. The SARS-CoV-2 RNA is generally detectable in upper respiratoy specimens during the acute phase of infection. The lowest concentration of SARS-CoV-2 viral copies this assay can detect is 131 copies/mL. A negative result does not preclude SARS-Cov-2 infection and should not be used as the sole basis for treatment or other patient management decisions. A negative result may occur with  improper specimen collection/handling, submission of specimen other than nasopharyngeal swab, presence of viral mutation(s) within the areas targeted by this assay, and inadequate number of viral copies (<131 copies/mL). A negative result must be combined with clinical observations, patient history, and epidemiological information. The expected result is Negative. Fact Sheet for Patients:  PinkCheek.be Fact Sheet for Healthcare Providers:  GravelBags.it This test is not yet ap proved or cleared by the [place] FDA and  has been authorized for detection and/or diagnosis of SARS-CoV-2 by FDA under an Emergency Use Authorization (EUA). This EUA will remain  in effect (meaning this test can be used) for the duration of the COVID-19 declaration under Section 564(b)(1) of the Act, 21 U.S.C. section 360bbb-3(b)(1), unless the authorization is terminated or revoked sooner.    Influenza A by PCR NEGATIVE NEGATIVE Final   Influenza B by PCR NEGATIVE NEGATIVE  Final    Comment: (NOTE) The Xpert Xpress SARS-CoV-2/FLU/RSV assay is intended as an aid in  the diagnosis of influenza from Nasopharyngeal swab specimens and  should not be used as a sole basis for treatment. Nasal washings and  aspirates are unacceptable for Xpert Xpress SARS-CoV-2/FLU/RSV  testing. Fact Sheet for Patients: PinkCheek.be Fact Sheet for Healthcare Providers: GravelBags.it This test is not yet approved or cleared by the [place] FDA and  has been authorized for detection and/or diagnosis of SARS-CoV-2 by  FDA under an Emergency Use Authorization (EUA). This EUA will remain  in effect (meaning this test can be used) for the duration of the  Covid-19 declaration under Section 564(b)(1) of the Act, 21  U.S.C. section 360bbb-3(b)(1), unless the authorization is  terminated or revoked. Performed at [facility], [place] [address], [place], [place] [address]   SARS CORONAVIRUS 2 (TAT 6-24 HRS) Nasopharyngeal Nasopharyngeal Swab  Status: None   Collection Time: 11/05/19 10:46 AM   Specimen: Nasopharyngeal Swab  Result Value Ref Range Status   SARS Coronavirus 2 NEGATIVE NEGATIVE Final    Comment: (NOTE) SARS-CoV-2 target nucleic acids are NOT DETECTED. The SARS-CoV-2 RNA is generally detectable in upper and lower respiratory specimens during the acute phase of infection. Negative results do not preclude SARS-CoV-2 infection, do not rule out co-infections with other pathogens, and should not be used as the sole basis for treatment or other patient management decisions. Negative results must be combined with clinical observations, patient history, and epidemiological information. The expected result is Negative. Fact Sheet for Patients: SugarRoll.be Fact Sheet for Healthcare Providers: https://www.woods-mathews.com/ This test is not yet approved or cleared by the  [place] FDA and  has been authorized for detection and/or diagnosis of SARS-CoV-2 by FDA under an Emergency Use Authorization (EUA). This EUA will remain  in effect (meaning this test can be used) for the duration of the COVID-19 declaration under Section 56 4(b)(1) of the Act, 21 U.S.C. section 360bbb-3(b)(1), unless the authorization is terminated or revoked sooner. Performed at [facility], [place] [address], [place], [place] [address]     Coagulation Studies: No results for input(s): LABPROT, INR in the last 72 hours.  Urinalysis: No results for input(s): COLORURINE, LABSPEC, PHURINE, GLUCOSEU, HGBUR, BILIRUBINUR, KETONESUR, PROTEINUR, UROBILINOGEN, NITRITE, LEUKOCYTESUR in the last 72 hours.  Invalid input(s): APPERANCEUR    Imaging: No results found.   Medications:   . sodium chloride    . sodium chloride    . sodium chloride    . sodium chloride    . ferric gluconate (FERRLECIT/NULECIT) IV     . aspirin EC  81 mg Oral Daily  . atorvastatin  40 mg Oral Daily  . buPROPion  150 mg Oral BID  . Chlorhexidine Gluconate Cloth  6 each Topical Q0600  . clopidogrel  75 mg Oral Daily  . darbepoetin (ARANESP) injection - DIALYSIS  200 mcg Intravenous Q Wed-HD  . doxercalciferol  1 mcg Intravenous Q M,W,F-HD  . ferric citrate  630 mg Oral TID WC  . ipratropium-albuterol  3 mL Nebulization TID  . metoprolol succinate  50 mg Oral Daily  . mometasone-formoterol  2 puff Inhalation BID  . multivitamin  1 tablet Oral QHS  . QUEtiapine  200 mg Oral QHS  . sodium chloride flush  3 mL Intravenous Q12H   sodium chloride, sodium chloride, sodium chloride, sodium chloride, acetaminophen **OR** acetaminophen, albuterol, alteplase, ferric citrate, heparin, hydrOXYzine, lidocaine (PF), lidocaine-prilocaine, nitroGLYCERIN, ondansetron **OR** ondansetron (ZOFRAN) IV, pentafluoroprop-tetrafluoroeth  Assessment/ Plan:  1. CP/SOB-Improved with HD.cath with medical management  recommended per 12/16 report.Status post cardiac catheterization 11/01/2019.  Chest pain has resolved.  Etiology obscure.  Surgery recommending medical management 2. ESRD-MWF HD.   Patient continues on Monday Wednesday Friday schedule.  Her last dialysis treatment was successful on 11/03/2019 with 1 L removed. 3. Anemia-Hgb 10.1; onaranesp 257mcg qwk(Wed) 4. Secondary hyperparathyroidism-Ca in goal.P high -missing binders. Continue VDRA/binders. 5.HTN/volume-Blood pressure well controlled. Fluid status improved. At [facility] post HD Friday. 6. Nutrition- renal diet/vits 7.Severe COPD- chronic O2, likely contributory to SOB IV steroids, bronchodilator medications, BiPAP.  Optimizing volume control with dialysis.  Patient with ongoing tobacco cessation.  Apparently quit in September 2020 8. Deconditioning./falls - ongoing issue -Plan to go to SNF, awaiting placement- d/w in [facility] But still with falls. 9. Mild hyperkalemia - resolved      LOS: [place] @  TODAY@10 :54 AM

## 2019-11-06 NOTE — Progress Notes (Signed)
PROGRESS NOTE   Kari Robinson  XTG:[id number]    DOB: 09/29/1951    DOA: 10/31/2019  PCP: Tsosie Billing, MD (Inactive)   I have briefly reviewed patients previous medical records in [facility].  Chief Complaint:   Chief Complaint  Patient presents with  . Chest Pain    Brief Narrative:  68 year old female with PMH of CAD status post stents, NSTEMI 09/2017, ischemic cardiomyopathy, chronic systolic CHF (EF improved from 25% to 45-50%), essential hypertension, hyperlipidemia, COPD on home oxygen 3 L/min, lung cancer s/p left lung resection 2017, ESRD on MWF HD, PAF, tobacco abuse, recently admitted 12/3-12/7 for chest pain and elevated troponin, cardiology evaluated and recommended medical management, presented to [facility] ED on 12/15 due to chest pain, some relief with sublingual NTG, at times worse with deep inspiration or touching her chest and multiple falls day prior to admission.  Cardiology was consulted, s/p cardiac cath 12/16 without high-grade obstruction, medical management recommended.  Post cath 12/16 developed acute on chronic hypoxic respiratory failure due to COPD exacerbation and fluid overload, unable to get HD right away, managed with IV steroids and BiPAP, had HD 12/17 with improvement in respiratory status.  Patient agreeable to rehab, CIR versus SNF.  Now medically stable for discharge pending bed availability.  As per clinical social worker, earliest that patient could go to SNF would be 12/21 pending SNF bed, insurance and PASSR.  COVID-19 testing 12/20 was negative.   Assessment & Plan:  Active Problems:   Essential hypertension   Coronary atherosclerosis   Normocytic normochromic anemia   DM (diabetes mellitus), type 2 with renal complications ([name])   Chronic systolic CHF (congestive heart failure) ([name])   COPD (chronic obstructive pulmonary disease) ([name])   Type II diabetes mellitus with renal manifestations ([name])   HLD (hyperlipidemia)   ESRD (end  stage renal disease) on dialysis ([name])   Chest pain with moderate risk for cardiac etiology, hx CAD   Elevated troponin   Hyperkalemia   Weakness   Unstable angina ([place])   CAD s/p stents/chest pain/mild troponin elevation  Cardiology was consulted.  Patient has extensive cardiac history.  Recent admission for similar presentation i.e. chest pain and elevated troponins.  Cardiology felt that she had chest pain with typical and atypical features and was high risk given prior stents.  Underwent cardiac cath 12/16 with detailed report as below, did not require any intervention, medical management recommended and consider alternate causes.  Chest pain resolved without recurrence.  ?  GI versus musculoskeletal etiology of chest pain.  Continue aspirin, Plavix, statins and metoprolol.  Chest pain has resolved without recurrence over the last couple of days. Stable.  Acute on chronic hypoxic respiratory failure/COPD exacerbation/acute on chronic systolic CHF.  On afternoon of 12/16 post-cath developed worsening dyspnea and hypoxia.  Started on IV Solu-Medrol, bronchodilator nebulizations, required BiPAP overnight, unable to get HD yesterday due to scheduling issues from emergencies.  Had HD 12/17.  Improved and looks like she is saturating in the high 90s on room air.  Monitor closely.  I suspect her worsening dyspnea was more from decompensated CHF rather than COPD.  Her acute respiratory issues have resolved and she is back on her home dose of oxygen.  Discontinued IV Solu-Medrol.  Stable on prior home level of oxygen.  ESRD on MWF hemodialysis  Nephrology input appreciated.  Reportedly not able to make the last HD treatment as person who transported her was sick.  Patient underwent dialysis on  12/17, 12/18 and 12/21.  Hyperkalemia  Appears to be a recurrent problem, had this during previous admission.  Resolved after dialysis. Potassium 4.1 today.  Secondary  hyperparathyroidism  Management per nephrology.  Deconditioning/frequent falls  Patient refused SNF during prior admission.  Do not think that she is safe to return home.  Awaiting SNF placement for short-term rehab.  Tobacco abuse  Recently quit smoking in September 2020, encouraged and congratulated.  Essential hypertension  Controlled.  Volume management across dialysis.  Continue Toprol-XL.  Hyperlipidemia  Continue statins.  Anemia in ESRD  Her baseline hemoglobin is likely in the 9-10 range.  Aranesp per nephrology.  Continue ferric citrate.  Hemoglobin 9.7 today.  Has macrocytic anemia. Outpatient evaluation regarding macrocytosis.  Depression  Stable on Wellbutrin and Seroquel, continue.  EKG 12/18: QTC mildly prolonged at 479 ms but patient has BBB morphology and not sure if this is totally accurate.  Follow EKG periodically for QTC monitoring.  Added hydroxyzine as needed for anxiety yesterday.  Type II DM with renal manifestations  A1c 5.1.  DC SSI.  Reasonable inpatient control despite IV steroids.   DVT prophylaxis: SCDs Code Status: Full Family Communication: None at bedside. I discussed with patient's son on 12/18, updated care and answered questions.  Disposition: Patient medically stable for discharge pending SNF bed/insurance etc, hopefully 12/21. Waiting to hear back from [facility] team.   Consultants:   Cardiology Nephrology Rehab MD  Procedures:   Cardiac cath 11/01/2019:   Prox RCA to Mid RCA lesion is 30% stenosed.  Prox Cx to Mid Cx lesion is 20% stenosed.  Mid Cx lesion is 50% stenosed.  Ost Cx to Prox Cx lesion is 40% stenosed.  Ost LAD to Mid LAD lesion is 30% stenosed.  1st Sept lesion is 60% stenosed.  There is moderate left ventricular systolic dysfunction.  LV end diastolic pressure is moderately elevated.  The left ventricular ejection fraction is 35-45% by visual estimate.  There is mild (2+) mitral  regurgitation.   1. Diffuse, mild non-obstructive disease in the LAD  2. Patent stent in the mid Circumflex with minimal restenosis. Moderate stenosis just beyond the stented segment of the mid Circumflex artery. This does not appear to be flow limiting. 3. Mild non-obstructive disease in the mid RCA 4. Moderate, global LV systolic dysfunction. 5. Elevated filling pressures  Recommendations; Medical management of CAD. Explore other causes of chest pain.   Antimicrobials:   None   Subjective:  Patient seen this morning at dialysis. Denies complaints. No chest pain or dyspnea. Aware that we are waiting for SNF bed/insurance clearance for discharge.  Objective:   Vitals:   11/06/19 0900 11/06/19 0930 11/06/19 1000 11/06/19 1209  BP: (!) 96/53 (!) 85/51 (!) 97/55 112/60  Pulse: 73 70 74 75  Resp:    18  Temp:    (!) 97.5 F (36.4 C)  TempSrc:    Oral  SpO2:    99%  Weight:      Height:        General exam: Pleasant middle-age female, small built and thinly nourished lying comfortably supine in bed undergoing dialysis. Respiratory system: Clear to auscultation. No increased work of breathing. Cardiovascular system: S1 & S2 heard, RRR. No JVD, murmurs, rubs, gallops or clicks. No pedal edema.  Off of telemetry, discontinued. Gastrointestinal system: Abdomen is nondistended, soft and nontender. No organomegaly or masses felt. Normal bowel sounds heard. Central nervous system: Alert and oriented. No focal neurological deficits. Extremities: Symmetric 5  x 5 power. Skin: No rashes, lesions or ulcers.  Small area of old bruise over right intrascapular area, possibly from previous fall Psychiatry: Judgement and insight appear normal. Mood & affect pleasant and appropriate.     Data Reviewed:   I have personally reviewed following labs and imaging studies   CBC: Recent Labs  Lab 10/31/19 1451 11/02/19 0309 11/03/19 1000 11/06/19 0805  WBC 7.7 8.0 8.0 7.3  NEUTROABS 6.0  --    --   --   HGB 11.5* 10.2* 10.1* 9.7*  HCT 36.3 31.4* 31.7* 30.3*  MCV 108.7* 106.1* 106.4* 105.9*  PLT 210 202 234 106    Basic Metabolic Panel: Recent Labs  Lab 11/01/19 0335 11/02/19 0309 11/02/19 0902 11/03/19 1000 11/06/19 0805  NA 140 139  --  137 139  K 5.1 6.6* 3.2* 4.9 4.1  CL 95* 96*  --  92* 97*  CO2 30 22  --  25 24  GLUCOSE 90 103*  --  126* 93  BUN 80* 95*  --  62* 82*  CREATININE 12.90* 14.56*  --  8.95* 10.71*  CALCIUM 9.4 9.7  --  9.3 9.1  MG 2.8*  --   --   --   --   PHOS 7.3*  --   --  7.7* 6.2*    Liver Function Tests: Recent Labs  Lab 10/31/19 1451 11/01/19 0335 11/03/19 1000 11/06/19 0805  AST 20 17  --   --   ALT 16 14  --   --   ALKPHOS 99 91  --   --   BILITOT 0.5 0.3  --   --   PROT 6.8 5.7*  --   --   ALBUMIN 3.8 3.2* 3.4* 3.2*    CBG: Recent Labs  Lab 11/05/19 1634 11/05/19 2151 11/06/19 0615  GLUCAP 104* 116* 106*    Microbiology Studies:   Recent Results (from the past 240 hour(s))  Respiratory Panel by RT PCR (Flu A&B, Covid) - Nasopharyngeal Swab     Status: None   Collection Time: 10/31/19  8:55 PM   Specimen: Nasopharyngeal Swab  Result Value Ref Range Status   SARS Coronavirus 2 by RT PCR NEGATIVE NEGATIVE Final    Comment: (NOTE) SARS-CoV-2 target nucleic acids are NOT DETECTED. The SARS-CoV-2 RNA is generally detectable in upper respiratoy specimens during the acute phase of infection. The lowest concentration of SARS-CoV-2 viral copies this assay can detect is 131 copies/mL. A negative result does not preclude SARS-Cov-2 infection and should not be used as the sole basis for treatment or other patient management decisions. A negative result may occur with  improper specimen collection/handling, submission of specimen other than nasopharyngeal swab, presence of viral mutation(s) within the areas targeted by this assay, and inadequate number of viral copies (<131 copies/mL). A negative result must be combined  with clinical observations, patient history, and epidemiological information. The expected result is Negative. Fact Sheet for Patients:  PinkCheek.be Fact Sheet for Healthcare Providers:  GravelBags.it This test is not yet ap proved or cleared by the [place] FDA and  has been authorized for detection and/or diagnosis of SARS-CoV-2 by FDA under an Emergency Use Authorization (EUA). This EUA will remain  in effect (meaning this test can be used) for the duration of the COVID-19 declaration under Section 564(b)(1) of the Act, 21 U.S.C. section 360bbb-3(b)(1), unless the authorization is terminated or revoked sooner.    Influenza A by PCR NEGATIVE NEGATIVE Final   Influenza  B by PCR NEGATIVE NEGATIVE Final    Comment: (NOTE) The Xpert Xpress SARS-CoV-2/FLU/RSV assay is intended as an aid in  the diagnosis of influenza from Nasopharyngeal swab specimens and  should not be used as a sole basis for treatment. Nasal washings and  aspirates are unacceptable for Xpert Xpress SARS-CoV-2/FLU/RSV  testing. Fact Sheet for Patients: PinkCheek.be Fact Sheet for Healthcare Providers: GravelBags.it This test is not yet approved or cleared by the [place] FDA and  has been authorized for detection and/or diagnosis of SARS-CoV-2 by  FDA under an Emergency Use Authorization (EUA). This EUA will remain  in effect (meaning this test can be used) for the duration of the  Covid-19 declaration under Section 564(b)(1) of the Act, 21  U.S.C. section 360bbb-3(b)(1), unless the authorization is  terminated or revoked. Performed at [facility], [place] [address], [place], [place] [address]   SARS CORONAVIRUS 2 (TAT 6-24 HRS) Nasopharyngeal Nasopharyngeal Swab     Status: None   Collection Time: 11/05/19 10:46 AM   Specimen: Nasopharyngeal Swab  Result Value Ref Range Status    SARS Coronavirus 2 NEGATIVE NEGATIVE Final    Comment: (NOTE) SARS-CoV-2 target nucleic acids are NOT DETECTED. The SARS-CoV-2 RNA is generally detectable in upper and lower respiratory specimens during the acute phase of infection. Negative results do not preclude SARS-CoV-2 infection, do not rule out co-infections with other pathogens, and should not be used as the sole basis for treatment or other patient management decisions. Negative results must be combined with clinical observations, patient history, and epidemiological information. The expected result is Negative. Fact Sheet for Patients: SugarRoll.be Fact Sheet for Healthcare Providers: https://www.woods-mathews.com/ This test is not yet approved or cleared by the [place] FDA and  has been authorized for detection and/or diagnosis of SARS-CoV-2 by FDA under an Emergency Use Authorization (EUA). This EUA will remain  in effect (meaning this test can be used) for the duration of the COVID-19 declaration under Section 56 4(b)(1) of the Act, 21 U.S.C. section 360bbb-3(b)(1), unless the authorization is terminated or revoked sooner. Performed at [facility], [place] [address] [place], [place] [address]      Radiology Studies:  No results found.   Scheduled Meds:   . aspirin EC  81 mg Oral Daily  . atorvastatin  40 mg Oral Daily  . buPROPion  150 mg Oral BID  . Chlorhexidine Gluconate Cloth  6 each Topical Q0600  . clopidogrel  75 mg Oral Daily  . darbepoetin (ARANESP) injection - DIALYSIS  200 mcg Intravenous Q Wed-HD  . doxercalciferol  1 mcg Intravenous Q M,W,F-HD  . ferric citrate  630 mg Oral TID WC  . ipratropium-albuterol  3 mL Nebulization TID  . metoprolol succinate  50 mg Oral Daily  . mometasone-formoterol  2 puff Inhalation BID  . multivitamin  1 tablet Oral QHS  . QUEtiapine  200 mg Oral QHS  . sodium chloride flush  3 mL Intravenous Q12H     Continuous Infusions:   . sodium chloride    . sodium chloride    . ferric gluconate (FERRLECIT/NULECIT) IV       LOS: 6 days     Vernell Leep, MD, [place], [facility]. [facility]    To contact the attending provider between 7A-7P or the covering provider during after hours 7P-7A, please log into the web site [email or web address] and access using universal [place] password for that web site. If you do not have the  password, please call the hospital operator.  11/06/2019, 12:35 PM

## 2019-11-07 ENCOUNTER — Non-Acute Institutional Stay (SKILLED_NURSING_FACILITY): Payer: Medicare Other | Admitting: Internal Medicine

## 2019-11-07 ENCOUNTER — Encounter: Payer: Self-pay | Admitting: Internal Medicine

## 2019-11-07 DIAGNOSIS — I200 Unstable angina: Secondary | ICD-10-CM | POA: Diagnosis not present

## 2019-11-07 DIAGNOSIS — R9431 Abnormal electrocardiogram [ECG] [EKG]: Secondary | ICD-10-CM | POA: Insufficient documentation

## 2019-11-07 DIAGNOSIS — R627 Adult failure to thrive: Secondary | ICD-10-CM | POA: Diagnosis not present

## 2019-11-07 DIAGNOSIS — J449 Chronic obstructive pulmonary disease, unspecified: Secondary | ICD-10-CM

## 2019-11-07 DIAGNOSIS — E1122 Type 2 diabetes mellitus with diabetic chronic kidney disease: Secondary | ICD-10-CM

## 2019-11-07 DIAGNOSIS — I5022 Chronic systolic (congestive) heart failure: Secondary | ICD-10-CM

## 2019-11-07 DIAGNOSIS — N185 Chronic kidney disease, stage 5: Secondary | ICD-10-CM

## 2019-11-07 NOTE — Assessment & Plan Note (Addendum)
A1c 5.1%; discordance possible with ESRD.  Glucoses were adequately controlled while hospitalized.  SSI discontinued at discharge.

## 2019-11-07 NOTE — Assessment & Plan Note (Signed)
Continue pulmonary toilet at SNF.

## 2019-11-07 NOTE — Assessment & Plan Note (Addendum)
Denies chest pain 11/07/2019 Cardiology follow-up 11/16/2019.

## 2019-11-07 NOTE — Progress Notes (Signed)
NURSING HOME LOCATION:  [facility] ROOM NUMBER:  [id number]  CODE STATUS:  FULL CODE  PCP:  Tsosie Billing, MD (Inactive)  [place] [address]  This is a comprehensive admission note to [facility] performed on this date less than 30 days from date of admission. Included are preadmission medical/surgical history; reconciled medication list; family history; social history and comprehensive review of systems.  Corrections and additions to the records were documented. Comprehensive physical exam was also performed. Additionally a clinical summary was entered for each active diagnosis pertinent to this admission in the Problem List to enhance continuity of care.  HPI: She was hospitalized 12/15-12/21/2020 with chest pain & elevated troponins.  Cardiology consultant felt chest pain had both typical and atypical features,but patient was high risk related to prior history of non-STEMI and stenting.  Cath was completed 12/16 and medical management was recommended.  Following the catheterization on 12/16 she developed worsening dyspnea and hypoxia.  She received IV Solu-Medrol, bronchodilator nebulization, and BiPAP overnight.  The clinical picture was felt to be related to decompensated congestive heart failure rather than COPD exacerbation. Hemodialysis was scheduled as her medical comorbidities allowed. Chest pain did resolve over the several days prior to discharge. Hyperkalemia was a recurrent issue; it did resolve with hemodialysis. Although she had history of type 2 diabetes, A1c was 5.1.  Despite the IV steroids glucoses were well controlled. Plavix, statins, and beta-blocker were continued. On a prior admission with similar symptoms the patient had refused SNF placement.  PT/OT recommended this because of deconditioning and frequent falls.  Return to home was not felt to be safe.   Past medical and surgical history: Includes non-STEMI, ischemic  cardiomyopathy, chronic systolic congestive heart failure, renal artery stenosis, past history of lung cancer, dyslipidemia, essential hypertension, ESRD, depression, smoking induced COPD, alcohol abuse, and CAD. Surgeries and procedures include coronary stenting and lung surgery for the cancer.  Social history: As noted former history of alcohol abuse.  Smoked for 35 years, quitting in in September for the second time this year.   Family history: Reviewed.  Her father did have emphysema and a brother cancer.   Review of systems:  Date given correctly.  She denies any active cardiopulmonary symptoms.  She states that she did have some heartburn earlier today.  She describes her stool as black related to vitamin supplement.  She does validate anxiety and depression which is stable.  She states that she stays thirsty but is on fluid restriction and therefore chews gum constantly.  She states that her boyfriend has told her she is a "light snorer".  She denies any sleep apnea history.  Constitutional: No fever, significant weight change, fatigue  Eyes: No redness, discharge, pain, vision change ENT/mouth: No nasal congestion, purulent discharge, earache, change in hearing, sore throat  Cardiovascular: No chest pain, palpitations, paroxysmal nocturnal dyspnea, claudication, edema  Respiratory: No cough, sputum production, hemoptysis, DOE, significant snoring Gastrointestinal: No dysphagia, abdominal pain, nausea /vomiting, rectal bleeding Genitourinary: No dysuria, hematuria, pyuria, incontinence, nocturia Musculoskeletal: No joint stiffness, joint swelling, weakness, pain Dermatologic: No rash, pruritus, change in appearance of skin Neurologic: No dizziness, headache, syncope, seizures, numbness, tingling Psychiatric: No  insomnia, anorexia Endocrine: No change in hair/skin/nails,  excessive hunger, excessive urination (she has oliguria with ESRD) Hematologic/lymphatic: No significant bruising,  lymphadenopathy, abnormal bleeding Allergy/immunology: No itchy/watery eyes, significant sneezing, urticaria, angioedema  Physical exam:  Pertinent or positive findings: Hair is in disarray and void of natural color.  Speech is slightly slurred.  She is edentulous.  Chest is barrel-shaped and breath sounds are distant.  Heart sounds are also distant.  Abdomen is protuberant.  Pedal pulses are decreased.  Clubbing of the nailbeds is noted.  She has scattered bruising over the forearm and eschar over the right elbow.  The subcutaneous veins are markedly prominent in the right dorsal forearm.  She has tattoos over the dorsum of the right hand and a small tear-shaped tattoo over the right malar area. Intermittent tremor LUE & BLE with exam of feet.  General appearance: Adequately nourished; no acute distress, increased work of breathing is present.   Lymphatic: No lymphadenopathy about the head, neck, axilla. Eyes: No conjunctival inflammation or lid edema is present. There is no scleral icterus. Ears:  External ear exam shows no significant lesions or deformities.   Nose:  External nasal examination shows no deformity or inflammation. Nasal mucosa are pink and moist without lesions, exudates Oral exam: Lips and gums are healthy appearing.There is no oropharyngeal erythema or exudate. Neck:  No thyromegaly, masses, tenderness noted.    Heart:  No gallop, murmur, click, rub.  Lungs:  without wheezes, rhonchi, rales, rubs. Abdomen: Bowel sounds are normal.  Abdomen is soft and nontender with no organomegaly, hernias, masses. GU: Deferred  Extremities:  No cyanosis, edema. Neurologic exam:  Strength equal  in upper & lower extremities. Balance, Rhomberg, finger to nose testing could not be completed due to clinical state Skin: Warm & dry w/o tenting. No significant  rash.  See clinical summary under each active problem in the Problem List with associated updated therapeutic plan

## 2019-11-07 NOTE — Assessment & Plan Note (Addendum)
Avoid  meds associated with potential QT prolongation  Cardiology follow-up 11/16/2019

## 2019-11-07 NOTE — Patient Instructions (Signed)
See assessment and plan under each diagnosis in the problem list and acutely for this visit 

## 2019-11-07 NOTE — Assessment & Plan Note (Addendum)
11/07/2019 denies anorexia & very complimentary of meals Nutrition consult & PT/OT at SNF.

## 2019-11-08 NOTE — Assessment & Plan Note (Signed)
Clinically compensated 11/07/2019

## 2019-11-11 ENCOUNTER — Telehealth: Payer: Self-pay | Admitting: Nurse Practitioner

## 2019-11-11 ENCOUNTER — Inpatient Hospital Stay (HOSPITAL_COMMUNITY): Payer: Medicare Other

## 2019-11-11 ENCOUNTER — Emergency Department (HOSPITAL_COMMUNITY): Payer: Medicare Other

## 2019-11-11 ENCOUNTER — Other Ambulatory Visit: Payer: Self-pay

## 2019-11-11 ENCOUNTER — Encounter (HOSPITAL_COMMUNITY): Payer: Self-pay | Admitting: Emergency Medicine

## 2019-11-11 ENCOUNTER — Inpatient Hospital Stay (HOSPITAL_COMMUNITY)
Admission: EM | Admit: 2019-11-11 | Discharge: 2019-11-13 | DRG: 291 | Disposition: A | Discharge status: Skilled Nursing Facility | Payer: Medicare Other | Source: Skilled Nursing Facility | Attending: Internal Medicine | Admitting: Internal Medicine

## 2019-11-11 DIAGNOSIS — N186 End stage renal disease: Secondary | ICD-10-CM

## 2019-11-11 DIAGNOSIS — I4581 Long QT syndrome: Secondary | ICD-10-CM | POA: Diagnosis present

## 2019-11-11 DIAGNOSIS — Z902 Acquired absence of lung [part of]: Secondary | ICD-10-CM | POA: Diagnosis not present

## 2019-11-11 DIAGNOSIS — I255 Ischemic cardiomyopathy: Secondary | ICD-10-CM | POA: Diagnosis present

## 2019-11-11 DIAGNOSIS — E8779 Other fluid overload: Secondary | ICD-10-CM | POA: Diagnosis present

## 2019-11-11 DIAGNOSIS — I729 Aneurysm of unspecified site: Secondary | ICD-10-CM

## 2019-11-11 DIAGNOSIS — F418 Other specified anxiety disorders: Secondary | ICD-10-CM | POA: Diagnosis present

## 2019-11-11 DIAGNOSIS — N2581 Secondary hyperparathyroidism of renal origin: Secondary | ICD-10-CM | POA: Diagnosis present

## 2019-11-11 DIAGNOSIS — I480 Paroxysmal atrial fibrillation: Secondary | ICD-10-CM | POA: Diagnosis present

## 2019-11-11 DIAGNOSIS — I2510 Atherosclerotic heart disease of native coronary artery without angina pectoris: Secondary | ICD-10-CM | POA: Diagnosis present

## 2019-11-11 DIAGNOSIS — Z885 Allergy status to narcotic agent status: Secondary | ICD-10-CM

## 2019-11-11 DIAGNOSIS — Z85118 Personal history of other malignant neoplasm of bronchus and lung: Secondary | ICD-10-CM | POA: Diagnosis not present

## 2019-11-11 DIAGNOSIS — D631 Anemia in chronic kidney disease: Secondary | ICD-10-CM | POA: Diagnosis present

## 2019-11-11 DIAGNOSIS — N179 Acute kidney failure, unspecified: Secondary | ICD-10-CM | POA: Diagnosis not present

## 2019-11-11 DIAGNOSIS — R079 Chest pain, unspecified: Secondary | ICD-10-CM | POA: Diagnosis not present

## 2019-11-11 DIAGNOSIS — I10 Essential (primary) hypertension: Secondary | ICD-10-CM | POA: Diagnosis not present

## 2019-11-11 DIAGNOSIS — J449 Chronic obstructive pulmonary disease, unspecified: Secondary | ICD-10-CM | POA: Diagnosis present

## 2019-11-11 DIAGNOSIS — Z825 Family history of asthma and other chronic lower respiratory diseases: Secondary | ICD-10-CM

## 2019-11-11 DIAGNOSIS — I252 Old myocardial infarction: Secondary | ICD-10-CM

## 2019-11-11 DIAGNOSIS — Z955 Presence of coronary angioplasty implant and graft: Secondary | ICD-10-CM | POA: Diagnosis not present

## 2019-11-11 DIAGNOSIS — E8770 Fluid overload, unspecified: Secondary | ICD-10-CM | POA: Diagnosis present

## 2019-11-11 DIAGNOSIS — I469 Cardiac arrest, cause unspecified: Secondary | ICD-10-CM | POA: Diagnosis not present

## 2019-11-11 DIAGNOSIS — I5022 Chronic systolic (congestive) heart failure: Secondary | ICD-10-CM | POA: Diagnosis not present

## 2019-11-11 DIAGNOSIS — E875 Hyperkalemia: Secondary | ICD-10-CM | POA: Diagnosis not present

## 2019-11-11 DIAGNOSIS — E785 Hyperlipidemia, unspecified: Secondary | ICD-10-CM | POA: Diagnosis present

## 2019-11-11 DIAGNOSIS — E1121 Type 2 diabetes mellitus with diabetic nephropathy: Secondary | ICD-10-CM

## 2019-11-11 DIAGNOSIS — E7800 Pure hypercholesterolemia, unspecified: Secondary | ICD-10-CM | POA: Diagnosis present

## 2019-11-11 DIAGNOSIS — J9621 Acute and chronic respiratory failure with hypoxia: Secondary | ICD-10-CM | POA: Diagnosis present

## 2019-11-11 DIAGNOSIS — E8889 Other specified metabolic disorders: Secondary | ICD-10-CM | POA: Diagnosis present

## 2019-11-11 DIAGNOSIS — Z7982 Long term (current) use of aspirin: Secondary | ICD-10-CM

## 2019-11-11 DIAGNOSIS — I132 Hypertensive heart and chronic kidney disease with heart failure and with stage 5 chronic kidney disease, or end stage renal disease: Principal | ICD-10-CM | POA: Diagnosis present

## 2019-11-11 DIAGNOSIS — I714 Abdominal aortic aneurysm, without rupture, unspecified: Secondary | ICD-10-CM

## 2019-11-11 DIAGNOSIS — I5023 Acute on chronic systolic (congestive) heart failure: Secondary | ICD-10-CM | POA: Diagnosis present

## 2019-11-11 DIAGNOSIS — E1122 Type 2 diabetes mellitus with diabetic chronic kidney disease: Secondary | ICD-10-CM | POA: Diagnosis present

## 2019-11-11 DIAGNOSIS — Z20828 Contact with and (suspected) exposure to other viral communicable diseases: Secondary | ICD-10-CM | POA: Diagnosis present

## 2019-11-11 DIAGNOSIS — Z7902 Long term (current) use of antithrombotics/antiplatelets: Secondary | ICD-10-CM

## 2019-11-11 DIAGNOSIS — Z87891 Personal history of nicotine dependence: Secondary | ICD-10-CM

## 2019-11-11 DIAGNOSIS — Z9981 Dependence on supplemental oxygen: Secondary | ICD-10-CM | POA: Diagnosis not present

## 2019-11-11 DIAGNOSIS — Z7951 Long term (current) use of inhaled steroids: Secondary | ICD-10-CM

## 2019-11-11 DIAGNOSIS — R9431 Abnormal electrocardiogram [ECG] [EKG]: Secondary | ICD-10-CM | POA: Diagnosis not present

## 2019-11-11 DIAGNOSIS — J9601 Acute respiratory failure with hypoxia: Secondary | ICD-10-CM | POA: Diagnosis not present

## 2019-11-11 DIAGNOSIS — Z888 Allergy status to other drugs, medicaments and biological substances status: Secondary | ICD-10-CM

## 2019-11-11 DIAGNOSIS — Z8701 Personal history of pneumonia (recurrent): Secondary | ICD-10-CM

## 2019-11-11 DIAGNOSIS — E1129 Type 2 diabetes mellitus with other diabetic kidney complication: Secondary | ICD-10-CM | POA: Diagnosis present

## 2019-11-11 DIAGNOSIS — Z992 Dependence on renal dialysis: Secondary | ICD-10-CM | POA: Diagnosis not present

## 2019-11-11 DIAGNOSIS — E872 Acidosis: Secondary | ICD-10-CM | POA: Diagnosis not present

## 2019-11-11 DIAGNOSIS — J9620 Acute and chronic respiratory failure, unspecified whether with hypoxia or hypercapnia: Secondary | ICD-10-CM

## 2019-11-11 DIAGNOSIS — Z79899 Other long term (current) drug therapy: Secondary | ICD-10-CM

## 2019-11-11 LAB — CBC
HCT: 32.7 % — ABNORMAL LOW (ref 36.0–46.0)
Hemoglobin: 10.1 g/dL — ABNORMAL LOW (ref 12.0–15.0)
MCH: 33.8 pg (ref 26.0–34.0)
MCHC: 30.9 g/dL (ref 30.0–36.0)
MCV: 109.4 fL — ABNORMAL HIGH (ref 80.0–100.0)
Platelets: 258 K/uL (ref 150–400)
RBC: 2.99 MIL/uL — ABNORMAL LOW (ref 3.87–5.11)
RDW: 15.4 % (ref 11.5–15.5)
WBC: 9.3 K/uL (ref 4.0–10.5)
nRBC: 0.4 % — ABNORMAL HIGH (ref 0.0–0.2)

## 2019-11-11 LAB — BASIC METABOLIC PANEL WITH GFR
Anion gap: 15 (ref 5–15)
BUN: 77 mg/dL — ABNORMAL HIGH (ref 8–23)
CO2: 25 mmol/L (ref 22–32)
Calcium: 9.9 mg/dL (ref 8.9–10.3)
Chloride: 99 mmol/L (ref 98–111)
Creatinine, Ser: 10.29 mg/dL — ABNORMAL HIGH (ref 0.44–1.00)
GFR calc Af Amer: 4 mL/min — ABNORMAL LOW
GFR calc non Af Amer: 3 mL/min — ABNORMAL LOW
Glucose, Bld: 121 mg/dL — ABNORMAL HIGH (ref 70–99)
Potassium: 4.7 mmol/L (ref 3.5–5.1)
Sodium: 139 mmol/L (ref 135–145)

## 2019-11-11 LAB — GLUCOSE, CAPILLARY: Glucose-Capillary: 130 mg/dL — ABNORMAL HIGH (ref 70–99)

## 2019-11-11 LAB — RESPIRATORY PANEL BY RT PCR (FLU A&B, COVID)
Influenza A by PCR: NEGATIVE
Influenza B by PCR: NEGATIVE
SARS Coronavirus 2 by RT PCR: NEGATIVE

## 2019-11-11 LAB — TROPONIN I (HIGH SENSITIVITY)
Troponin I (High Sensitivity): 67 ng/L — ABNORMAL HIGH (ref <18)
Troponin I (High Sensitivity): 66 ng/L — ABNORMAL HIGH

## 2019-11-11 LAB — BRAIN NATRIURETIC PEPTIDE: B Natriuretic Peptide: >4500 pg/mL — ABNORMAL HIGH (ref 0.0–100.0)

## 2019-11-11 MED ORDER — ATORVASTATIN CALCIUM 40 MG PO TABS
40.0000 mg | ORAL_TABLET | Freq: Every day | ORAL | Status: DC
  Administered 2019-11-12 – 2019-11-13 (×2): 40 mg via ORAL
  Filled 2019-11-11 (×2): qty 1

## 2019-11-11 MED ORDER — ACETAMINOPHEN 325 MG PO TABS: 650.0000 mg | ORAL_TABLET | Freq: Four times a day (QID) | ORAL | Status: DC | PRN

## 2019-11-11 MED ORDER — IPRATROPIUM-ALBUTEROL 0.5-2.5 (3) MG/3ML IN SOLN
3.0000 mL | Freq: Once | RESPIRATORY_TRACT | Status: AC
  Administered 2019-11-11: 3 mL via RESPIRATORY_TRACT
  Filled 2019-11-11: qty 3

## 2019-11-11 MED ORDER — CHLORHEXIDINE GLUCONATE CLOTH 2 % EX PADS
6.0000 | MEDICATED_PAD | Freq: Every day | CUTANEOUS | Status: DC
  Administered 2019-11-12: 6 via TOPICAL

## 2019-11-11 MED ORDER — ONDANSETRON HCL 4 MG PO TABS: 4.0000 mg | ORAL_TABLET | Freq: Four times a day (QID) | ORAL | Status: DC | PRN

## 2019-11-11 MED ORDER — SODIUM CHLORIDE 0.9% FLUSH
3.0000 mL | Freq: Two times a day (BID) | INTRAVENOUS | Status: DC
  Administered 2019-11-11 – 2019-11-13 (×3): 3 mL via INTRAVENOUS

## 2019-11-11 MED ORDER — CLOPIDOGREL BISULFATE 75 MG PO TABS
75.0000 mg | ORAL_TABLET | Freq: Every day | ORAL | Status: DC
  Administered 2019-11-12 – 2019-11-13 (×2): 75 mg via ORAL
  Filled 2019-11-11 (×2): qty 1

## 2019-11-11 MED ORDER — ONDANSETRON HCL 4 MG/2ML IJ SOLN
4.0000 mg | Freq: Four times a day (QID) | INTRAMUSCULAR | Status: DC | PRN
  Administered 2019-11-11: 4 mg via INTRAVENOUS
  Filled 2019-11-11: qty 2

## 2019-11-11 MED ORDER — METOPROLOL SUCCINATE ER 50 MG PO TB24
50.0000 mg | ORAL_TABLET | Freq: Every day | ORAL | Status: DC
  Administered 2019-11-12 – 2019-11-13 (×2): 50 mg via ORAL
  Filled 2019-11-11 (×2): qty 1

## 2019-11-11 MED ORDER — LORAZEPAM 2 MG/ML IJ SOLN: 1.0000 mg | Freq: Once | INTRAMUSCULAR | Status: DC

## 2019-11-11 MED ORDER — METHYLPREDNISOLONE SODIUM SUCC 125 MG IJ SOLR
125.0000 mg | Freq: Once | INTRAMUSCULAR | Status: AC
  Administered 2019-11-11: 125 mg via INTRAVENOUS
  Filled 2019-11-11: qty 2

## 2019-11-11 MED ORDER — CAMPHOR-MENTHOL 0.5-0.5 % EX LOTN
1.0000 "application " | TOPICAL_LOTION | Freq: Three times a day (TID) | CUTANEOUS | Status: DC | PRN
  Filled 2019-11-11: qty 222

## 2019-11-11 MED ORDER — IPRATROPIUM-ALBUTEROL 0.5-2.5 (3) MG/3ML IN SOLN
3.0000 mL | Freq: Four times a day (QID) | RESPIRATORY_TRACT | Status: DC
  Administered 2019-11-12 – 2019-11-13 (×6): 3 mL via RESPIRATORY_TRACT
  Filled 2019-11-11 (×6): qty 3

## 2019-11-11 MED ORDER — ALBUTEROL SULFATE HFA 108 (90 BASE) MCG/ACT IN AERS
4.0000 | INHALATION_SPRAY | Freq: Once | RESPIRATORY_TRACT | Status: AC
  Administered 2019-11-11: 4 via RESPIRATORY_TRACT
  Filled 2019-11-11: qty 6.7

## 2019-11-11 MED ORDER — FERRIC CITRATE 1 GM 210 MG(FE) PO TABS
630.0000 mg | ORAL_TABLET | ORAL | Status: DC
  Administered 2019-11-12 (×3): 630 mg via ORAL
  Filled 2019-11-11 (×3): qty 3

## 2019-11-11 MED ORDER — HYDROXYZINE HCL 25 MG PO TABS: 25.0000 mg | ORAL_TABLET | Freq: Three times a day (TID) | ORAL | Status: DC | PRN

## 2019-11-11 MED ORDER — MOMETASONE FURO-FORMOTEROL FUM 200-5 MCG/ACT IN AERO
2.0000 | INHALATION_SPRAY | Freq: Two times a day (BID) | RESPIRATORY_TRACT | Status: DC
  Administered 2019-11-12 – 2019-11-13 (×3): 2 via RESPIRATORY_TRACT
  Filled 2019-11-11: qty 8.8

## 2019-11-11 MED ORDER — RENA-VITE PO TABS
1.0000 | ORAL_TABLET | Freq: Every day | ORAL | Status: DC
  Administered 2019-11-11 – 2019-11-12 (×2): 1 via ORAL
  Filled 2019-11-11 (×2): qty 1

## 2019-11-11 MED ORDER — SORBITOL 70 % SOLN: 30.0000 mL | Status: DC | PRN

## 2019-11-11 MED ORDER — ACETAMINOPHEN 650 MG RE SUPP: 650.0000 mg | Freq: Four times a day (QID) | RECTAL | Status: DC | PRN

## 2019-11-11 MED ORDER — IPRATROPIUM BROMIDE HFA 17 MCG/ACT IN AERS: 2.0000 | INHALATION_SPRAY | Freq: Once | RESPIRATORY_TRACT | Status: DC

## 2019-11-11 MED ORDER — FERRIC CITRATE 1 GM 210 MG(FE) PO TABS: 630.0000 mg | ORAL_TABLET | ORAL | Status: DC

## 2019-11-11 MED ORDER — IPRATROPIUM-ALBUTEROL 20-100 MCG/ACT IN AERS: 1.0000 | INHALATION_SPRAY | Freq: Four times a day (QID) | RESPIRATORY_TRACT | Status: DC

## 2019-11-11 MED ORDER — HYDROCODONE-ACETAMINOPHEN 5-325 MG PO TABS
1.0000 | ORAL_TABLET | Freq: Once | ORAL | Status: AC
  Administered 2019-11-11: 1 via ORAL
  Filled 2019-11-11: qty 1

## 2019-11-11 MED ORDER — NITROGLYCERIN 0.4 MG SL SUBL
0.4000 mg | SUBLINGUAL_TABLET | Freq: Once | SUBLINGUAL | Status: DC
  Filled 2019-11-11: qty 1

## 2019-11-11 MED ORDER — NEPRO/CARBSTEADY PO LIQD: 237.0000 mL | Freq: Three times a day (TID) | ORAL | Status: DC | PRN

## 2019-11-11 MED ORDER — ASPIRIN EC 81 MG PO TBEC
81.0000 mg | DELAYED_RELEASE_TABLET | Freq: Every day | ORAL | Status: DC
  Administered 2019-11-12 – 2019-11-13 (×2): 81 mg via ORAL
  Filled 2019-11-11 (×3): qty 1

## 2019-11-11 MED ORDER — AEROCHAMBER PLUS FLO-VU LARGE MISC: 1.0000 | Freq: Once | Status: DC

## 2019-11-11 MED ORDER — DOCUSATE SODIUM 283 MG RE ENEM
1.0000 | ENEMA | RECTAL | Status: DC | PRN
  Filled 2019-11-11: qty 1

## 2019-11-11 MED ORDER — HEPARIN SODIUM (PORCINE) 5000 UNIT/ML IJ SOLN
5000.0000 [IU] | Freq: Three times a day (TID) | INTRAMUSCULAR | Status: DC
  Administered 2019-11-11 – 2019-11-13 (×5): 5000 [IU] via SUBCUTANEOUS
  Filled 2019-11-11 (×5): qty 1

## 2019-11-11 MED ORDER — BUPROPION HCL ER (SR) 150 MG PO TB12
150.0000 mg | ORAL_TABLET | Freq: Two times a day (BID) | ORAL | Status: DC
  Administered 2019-11-11 – 2019-11-13 (×4): 150 mg via ORAL
  Filled 2019-11-11 (×4): qty 1

## 2019-11-11 MED ORDER — LORAZEPAM 2 MG/ML IJ SOLN
1.0000 mg | Freq: Once | INTRAMUSCULAR | Status: AC
  Administered 2019-11-11: 1 mg via INTRAVENOUS
  Filled 2019-11-11: qty 1

## 2019-11-11 MED ORDER — SODIUM CHLORIDE 0.9% FLUSH
3.0000 mL | Freq: Once | INTRAVENOUS | Status: AC
  Administered 2019-11-11: 3 mL via INTRAVENOUS

## 2019-11-11 MED ORDER — QUETIAPINE FUMARATE 200 MG PO TABS
200.0000 mg | ORAL_TABLET | Freq: Every day | ORAL | Status: DC
  Administered 2019-11-11 – 2019-11-12 (×2): 200 mg via ORAL
  Filled 2019-11-11 (×3): qty 1

## 2019-11-11 MED ORDER — FERRIC CITRATE 1 GM 210 MG(FE) PO TABS
630.0000 mg | ORAL_TABLET | ORAL | Status: DC
  Filled 2019-11-11: qty 3

## 2019-11-11 MED ORDER — ZOLPIDEM TARTRATE 5 MG PO TABS: 5.0000 mg | ORAL_TABLET | Freq: Every evening | ORAL | Status: DC | PRN

## 2019-11-11 MED ORDER — NITROGLYCERIN 0.4 MG SL SUBL: 0.4000 mg | SUBLINGUAL_TABLET | SUBLINGUAL | Status: DC | PRN

## 2019-11-11 MED ORDER — SODIUM CHLORIDE 0.9% FLUSH: 3.0000 mL | INTRAVENOUS | Status: DC | PRN

## 2019-11-11 MED ORDER — ALBUTEROL SULFATE (2.5 MG/3ML) 0.083% IN NEBU: 3.0000 mL | INHALATION_SOLUTION | RESPIRATORY_TRACT | Status: DC | PRN

## 2019-11-11 MED ORDER — CALCIUM CARBONATE ANTACID 1250 MG/5ML PO SUSP
500.0000 mg | Freq: Four times a day (QID) | ORAL | Status: DC | PRN
  Filled 2019-11-11: qty 5

## 2019-11-11 MED ORDER — SODIUM CHLORIDE 0.9 % IV SOLN: 250.0000 mL | INTRAVENOUS | Status: DC | PRN

## 2019-11-11 NOTE — ED Triage Notes (Addendum)
Pt to triage via [place] from [place].  C/o SOB that started at 6:45am.  Staff administered a neb and SOB was relieved.  Afterwards pt started having pressure to center of chest 10/10.  Staff administered 1 NTG and ASA 325mg .  Pain decreased to 8/10.  Pt wears 4 liters O2 at all times.  Pt was supposed to have dialysis yesterday but didn't have it due to Christmas.

## 2019-11-11 NOTE — ED Notes (Signed)
Attempted to call report x 1  

## 2019-11-11 NOTE — Progress Notes (Signed)
Patient's ECG reviewed.  ECG appears quite similar to prior ECGs without acute changes.  Troponin is mildly elevated, but with missing dialysis and a creatinine of 10, as well as a recent left heart catheterization that showed mild obstructive coronary artery disease, no further cardiac work-up is necessary at this point.  If she does have recurrent chest pain with significant troponin elevations, feel free to call cardiology for further evaluation.  Allegra Lai, MD

## 2019-11-11 NOTE — Progress Notes (Signed)
Received pt from dialysis, v/s stable, no sob when rest, tolerate well with nonrebreather, complains hungry, notify provider. Follow up new order

## 2019-11-11 NOTE — Progress Notes (Signed)
Received report from [place] in ED at 1625  Patient arrived approx 1710 on 15L non rebreather.   Continuous pulse ox initiated.   Called report to [place] in HD at [facility].  Paged Yates MD at [phone]. Patient now on non-rebreather 15L at 100%. Advance diet from NPO? Upon callback will reassess diet once return from HD given current O2 demand.   Patient left unit for HD at [facility]

## 2019-11-11 NOTE — Progress Notes (Signed)
Kari Robinson is a 68 y.o. female patient admitted from ED awake, alert - oriented  X 4 - no acute distress noted.  VSS - Blood pressure (!) 171/92, pulse 92, temperature 97.8 F (36.6 C), resp. rate 20, height 5\' 4"  (1.626 m), weight 72.4 kg, SpO2 100 %.    Patient arrived on 15L on non-rebreather, Bipap at bedside.   IV in place, occlusive dsg intact without redness.  Orientation to room, and floor completed with information packet given to patient/family.  Patient declined safety video at this time.  Admission INP armband ID verified with patient/family, and in place.   SR up x 2, fall assessment complete, with patient and family able to verbalize understanding of risk associated with falls, and verbalized understanding to call nsg before up out of bed.    Call light within reach, patient able to voice, and demonstrate understanding.  Skin to skin assessment completed with second RN. Skin integrity documented. Skin clean-dry- intact without evidence of bruising, or skin tears.    No evidence of skin break down noted on exam.   Will cont to eval and treat per MD orders.  Howard Pouch, RN 11/11/2019 6:13 PM

## 2019-11-11 NOTE — Consult Note (Addendum)
[facility] Renal Consultation Note    Indication for Consultation:  Management of ESRD/hemodialysis; anemia, hypertension/volume and secondary hyperparathyroidism  HPI: Kari Robinson is a 68 y.o. female with ESRD on HD MWF, HTN, COPD on chronic O2, CAD s/p stents.   Recent admission 12/15-12/21/20 with similar presentation of CP/SOB. Underwent cardiac cath 12/16 and did not require intervention. Acute on chronic resp failure improved with dialysis. She was discharged to [facility].   Now presenting to ED with CP/SOB starting this am. Pain relived some with nitroglycerin. BiPAP started for worsening respiratory distress. CXR shows chronic lung disease and unresolved right lung pneumonia. Influenza negative. Covid negative. K 4.7. BNP >4500. Bronchodilators + steroids given in ED. We are asked to see for urgent dialysis needs. Did not have HD yesterday d/t holiday schedule.    Seen and examined in ED. Unable to give much history d/t wearing BiPAP. Seems slightly confused, trying to get out of the bed on my exam.  Same when I see her  Dialyzes MWF [place]. She did attend dialysis on Wed and completed her full treatment and left at her dry weight 71.5kg.   Past Medical History:  Diagnosis Date  . Alcohol abuse   . Arthralgia   . CAD (coronary artery disease) 2006, 2016   stents when in [place]  . Chronic combined systolic and diastolic HF (heart failure) ([place])   . COPD (chronic obstructive pulmonary disease) ([place])   . Depression   . Dyspnea   . ESRD (end stage renal disease) ([place])   . HTN (hypertension)   . Hypercholesterolemia    primarily ldl-p and small particles  . Lung cancer [facility])    Patient reports this with surgery.  No details.    [name] RAS (renal artery stenosis) ([place]) 2002   by cath tysinger  . Smoking    Past Surgical History:  Procedure Laterality Date  . abdominal aortogram     perclose of the right femoral artery  . AV FISTULA PLACEMENT Right 07/11/2018    Procedure: Right arm ARTERIOVENOUS FISTULA CREATION;  Surgeon: [name], MD;  Location: [place];  Service: Vascular;  Laterality: Right;  . AVF placement Right   . CARDIAC CATHETERIZATION     left heart catheterization.  Coronary cineangiography. Lft ventricular cineangiography.    [name] LEFT HEART CATH AND CORONARY ANGIOGRAPHY N/A 11/01/2019   Procedure: LEFT HEART CATH AND CORONARY ANGIOGRAPHY;  Surgeon: Burnell Blanks, MD;  Location: [place] [facility];  Service: Cardiovascular;  Laterality: N/A;  . LUNG SURGERY     due to lung cancer per patient's report  . TUBAL LIGATION     Family History  Problem Relation Age of Onset  . Emphysema Father   . Cancer Brother    Social History:  reports that she quit smoking about 5 months ago. Her smoking use included cigarettes. She quit after 35.00 years of use. She has never used smokeless tobacco. She reports previous alcohol use. She reports that she does not use drugs. Allergies  Allergen Reactions  . Citalopram Hydrobromide Hives  . Morphine And Related Itching  . Tape Other (See Comments)    PLASTIC, CLEAR TAPE STICKS TO THE SKIN AND POSSIBLY TEARS IT- PLEASE USE PAPER TAPE   Prior to Admission medications   Medication Sig Start Date End Date Taking? Authorizing Provider  albuterol (PROVENTIL HFA;VENTOLIN HFA) 108 (90 Base) MCG/ACT inhaler Inhale 2 puffs into the lungs every 4 (four) hours as needed for wheezing  or shortness of breath.  11/25/17   [provider]  aspirin EC 81 MG tablet Take 81 mg by mouth daily.    [provider]  atorvastatin (LIPITOR) 40 MG tablet Take 40 mg by mouth daily.    [provider]  AURYXIA 1 GM 210 MG(Fe) tablet Take 420-630 mg by mouth See admin instructions. Take 3 tablets (630 mg) by mouth M-W-F at bedtime. Give 3 tablets TID with meals Tue-Thu-Sat-Sun. 04/06/19   [provider]  b complex-vitamin c-folic acid (NEPHRO-VITE) 0.8 MG TABS tablet Take 1  tablet by mouth See admin instructions. Take 1 tablet by mouth in the morning on Sun/Tues/Thurs/Sat and 1 tablet after dialysis on Mon/Wed/Fri 02/06/19   [provider]  budesonide-formoterol (SYMBICORT) 160-4.5 MCG/ACT inhaler Inhale 2 puffs into the lungs 2 (two) times daily.    [provider]  buPROPion (WELLBUTRIN SR) 150 MG 12 hr tablet Take 150 mg by mouth 2 (two) times daily.    [provider]  clopidogrel (PLAVIX) 75 MG tablet Take 75 mg by mouth daily.    [provider]  ipratropium-albuterol (DUONEB) 0.5-2.5 (3) MG/3ML SOLN Take 3 mLs by nebulization every 6 (six) hours as needed. 05/11/19   Dana Allan I, MD  lidocaine-prilocaine (EMLA) cream Apply 1 application topically every Monday, Wednesday, and Friday with hemodialysis. Prior to dialysis 04/06/19   [provider]  metoprolol succinate (TOPROL-XL) 50 MG 24 hr tablet Take 50 mg by mouth daily. Take with or immediately following a meal.    [provider]  nitroGLYCERIN (NITROSTAT) 0.4 MG SL tablet Place 1 tablet (0.4 mg total) under the tongue every 5 (five) minutes as needed for chest pain. 11/06/19   Hongalgi, Lenis Dickinson, MD  Nutritional Supplements (BOOST GLUCOSE CONTROL) LIQD Take 1 Can by mouth 2 (two) times daily.    [provider]  OXYGEN Inhale 3 L/min into the lungs continuous.     [provider]  QUEtiapine (SEROQUEL) 200 MG tablet Take 200 mg by mouth at bedtime.  09/20/19   [provider]   Current Facility-Administered Medications  Medication Dose Route Frequency Provider Last Rate Last Admin  . AeroChamber Plus Flo-Vu Large MISC 1 each  1 each Other Once Recardo Evangelist, PA-C      . [START ON 11/12/2019] Chlorhexidine Gluconate Cloth 2 % PADS 6 each  6 each Topical Q0600 Lynnda Child, PA-C      . ipratropium (ATROVENT HFA) inhaler 2 puff  2 puff Inhalation Once Lucrezia Starch, MD      . nitroGLYCERIN (NITROSTAT) SL  tablet 0.4 mg  0.4 mg Sublingual Once Lucrezia Starch, MD   Stopped at 11/11/19 1340   Current Outpatient Medications  Medication Sig Dispense Refill  . albuterol (PROVENTIL HFA;VENTOLIN HFA) 108 (90 Base) MCG/ACT inhaler Inhale 2 puffs into the lungs every 4 (four) hours as needed for wheezing or shortness of breath.     [name] aspirin EC 81 MG tablet Take 81 mg by mouth daily.    [name] atorvastatin (LIPITOR) 40 MG tablet Take 40 mg by mouth daily.    Lorin Picket 1 GM 210 MG(Fe) tablet Take 420-630 mg by mouth See admin instructions. Take 3 tablets (630 mg) by mouth M-W-F at bedtime. Give 3 tablets TID with meals Tue-Thu-Sat-Sun.    [name] b complex-vitamin c-folic acid (NEPHRO-VITE) 0.8 MG TABS tablet Take 1 tablet by mouth See admin instructions. Take 1 tablet by mouth in the morning  on Sun/Tues/Thurs/Sat and 1 tablet after dialysis on Mon/Wed/Fri    . budesonide-formoterol (SYMBICORT) 160-4.5 MCG/ACT inhaler Inhale 2 puffs into the lungs 2 (two) times daily.    [name] buPROPion (WELLBUTRIN SR) 150 MG 12 hr tablet Take 150 mg by mouth 2 (two) times daily.    . clopidogrel (PLAVIX) 75 MG tablet Take 75 mg by mouth daily.    [name] ipratropium-albuterol (DUONEB) 0.5-2.5 (3) MG/3ML SOLN Take 3 mLs by nebulization every 6 (six) hours as needed. 360 mL 0  . lidocaine-prilocaine (EMLA) cream Apply 1 application topically every Monday, Wednesday, and Friday with hemodialysis. Prior to dialysis    . metoprolol succinate (TOPROL-XL) 50 MG 24 hr tablet Take 50 mg by mouth daily. Take with or immediately following a meal.    . nitroGLYCERIN (NITROSTAT) 0.4 MG SL tablet Place 1 tablet (0.4 mg total) under the tongue every 5 (five) minutes as needed for chest pain.    . Nutritional Supplements (BOOST GLUCOSE CONTROL) LIQD Take 1 Can by mouth 2 (two) times daily.    . OXYGEN Inhale 3 L/min into the lungs continuous.     [name] QUEtiapine (SEROQUEL) 200 MG tablet Take 200 mg by mouth at bedtime.        ROS: As per HPI otherwise  negative.  Physical Exam: Vitals:   11/11/19 1300 11/11/19 1313 11/11/19 1332 11/11/19 1400  BP: (!) 172/99 (!) 188/109  (!) 154/143  Pulse: 96 (!) 105 (!) 122 (!) 115  Resp: (!) 32 (!) 26 (!) 30 19  Temp:      TempSrc:      SpO2: 100% 96% 100% 93%  Weight:      Height:         General: Chronically ill appearing on BiPAP in ED  Head: NCAT sclera not icteric MMM Neck: Supple. No JVD No masses Lungs: Diminished at R base; Crackles at left base  Heart: RRR with S1 S2 Abdomen: soft NT + BS Lower extremities:without edema or ischemic changes, no open wounds  Neuro: A & O  X 3. Moves all extremities spontaneously. Psych:  Responds to questions appropriately with a normal affect. Dialysis Access: R forearm AVF +bruit   Labs: Basic Metabolic Panel: Recent Labs  Lab 11/06/19 0805 11/11/19 0807  NA 139 139  K 4.1 4.7  CL 97* 99  CO2 24 25  GLUCOSE 93 121*  BUN 82* 77*  CREATININE 10.71* 10.29*  CALCIUM 9.1 9.9  PHOS 6.2*  --    Liver Function Tests: Recent Labs  Lab 11/06/19 0805  ALBUMIN 3.2*   No results for input(s): LIPASE, AMYLASE in the last 168 hours. No results for input(s): AMMONIA in the last 168 hours. CBC: Recent Labs  Lab 11/06/19 0805 11/11/19 0807  WBC 7.3 9.3  HGB 9.7* 10.1*  HCT 30.3* 32.7*  MCV 105.9* 109.4*  PLT 238 258   Cardiac Enzymes: No results for input(s): CKTOTAL, CKMB, CKMBINDEX, TROPONINI in the last 168 hours. CBG: Recent Labs  Lab 11/05/19 1115 11/05/19 1634 11/05/19 2151 11/06/19 0615 11/06/19 1201  GLUCAP 99 104* 116* 106* 131*   Iron Studies: No results for input(s): IRON, TIBC, TRANSFERRIN, FERRITIN in the last 72 hours. Studies/Results: DG Chest 2 View  Result Date: 11/11/2019 CLINICAL DATA:  68 year old female with central chest pain and increased shortness of breath this morning. History of COPD, lung cancer. Multiple negative tests for COVID-19, most recently 11/05/2019. EXAM: CHEST - 2 VIEW COMPARISON:   Portable chest 11/01/2019 and earlier.  FINDINGS: PA and lateral views of the chest. Chronic large lung volumes with emphysema and postoperative changes in the left lung. Stable tortuous aorta with calcified atherosclerosis. Stable cardiac size at the upper limits of normal. Substantial regression but not complete resolution of the multifocal right lung reticulonodular opacity on 11/01/2019. Residual mostly at the lung base. No pneumothorax or pulmonary edema. No pleural effusion. No areas of worsening ventilation. No acute osseous abnormality identified. Possible aneurysmal enlargement of the abdominal aorta, lateral view roughly 4 centimeters diameter. Negative visible bowel gas pattern. IMPRESSION: 1. Chronic lung disease with significantly improved but not completely resolved right lung pneumonia since 11/01/2019. 2. No new cardiopulmonary abnormality. 3. Extensive Aortic Atherosclerosis (ICD10-I70.0) with questionable fusiform aneurysmal enlargement of the abdominal aorta. Follow-up Aorta Ultrasound would be the simplest way to confirm or exclude AAA. Electronically Signed   By: Genevie Ann M.D.   On: 11/11/2019 08:47    Dialysis Orders:  [facility] MWF 3.5h 350/800 EDW 71.5kg 2K/2Ca UFP4  R AVF No heparin  Mircera 100 mcg IV q 2 weeks. Last 12/23 Hectorol 1 mcg IV q HD   Assessment/Plan: 1. Acute on chronic respiratory failure. Requiring BiPAP on admission. Will plan for urgent HD this afternoon for volume removal  2. CP. Hx of stents. Recent cardiac cath. EKG unchanged from prior. Likely related to #1.  3. ESRD -  HD MWF. HD today. Reassess need for extra HD in the am.  4. Hypertension/volume  - BP elevated on admission. Should improve with UF. Left at EDW on 12/23. Challenging EDW today. UF 3-3.5L as tolerated.  5. Anemia  - On ESA as outpatient. Follow trends.  6. Metabolic bone disease -  Continue home binders/VDRA  7. Nutrition - Renal diet/Fluid restrictions  8. COPD on chronic home O2   Lynnda Child PA-C [place] Pager [phone] 11/11/2019, 3:30 PM   Patient seen and examined, agree with above note with above modifications. Patient with ESRD- multiple ER visits and hospital admits-  Presented this AM to the ER with SOB.  WE were called at 3 PM with the determination that she needed emergent HD due to hypoxia requiring bipap.  Pt seems confused to me exam.  Plan on doing HD this evening on holiday schedule and re assess -  May need EDW lowered since got to EDW on Wed at OP center vs just over indulgence during holiday  Corliss Parish, MD 11/11/2019

## 2019-11-11 NOTE — Telephone Encounter (Signed)
Called and reported the patient's unrelieved chest pain after one dose of NTG, VS stable, Sat O2 90% at 3lpm via [place]. ED eval.

## 2019-11-11 NOTE — Progress Notes (Signed)
Discussed with provider on phone update pt conditions, new order noted.

## 2019-11-11 NOTE — ED Notes (Signed)
Patient sitting in bed, noted Sp02 on 4L via [place] @ 95-98%; w/ increased RR. Encourage patient to take deep breathes, patient verbalized feeling scared and anxious. Ordered medication + inhaler given.

## 2019-11-11 NOTE — H&P (Signed)
History and Physical    Loucinda Croy ZOX:[id number] DOB: 1950/12/16 DOA: 11/11/2019  PCP: Tsosie Billing, MD (Inactive) Consultants:  Nephrology Patient coming from: [facility]; NOK: Lolita Rieger,  [phone]  Chief Complaint: SOB  HPI: Kari Robinson is a 68 y.o. female with medical history significant of CAD status post stents, NSTEMI 09/2017, ischemic cardiomyopathy, chronic systolic CHF (EF improved from 25% to 45-50%), essential hypertension, hyperlipidemia, COPD on home oxygen 3 L/min, lung cancer s/p left lung resection 2017, ESRD on MWF HD, PAF, and tobacco abuse presenting with recurrent CP/SOB.  She was recently admitted 12/3-12/7 for chest pain and elevated troponin, cardiology evaluated and recommended medical management.  She was readmitted from 12/15-21 for recurrent CP with cardiac catheterization.  Once again cardiology recommended medical management.  She developed decompensated heart failure while admitted and was placed no BIPAP and then had HD with improvement.  She was discharged to SNF.   Since then, she reports progressive repeat SOB.  Last HD was Wednesday due to the holiday schedule.  She appears quite anxious and continues to try to remove her BIPAP mask.    ED Course:  Acute on chronic respiratory failure, coming from SNF.  Has CHF, ESRD on HD, recently admitted for the same.  Had cath, recommended for medical management.  She developed substernal CP this AM, improved with NTG.  Also with worsening SOB.  Reports she is not receiving medications regularly.  Initially with tachypnea, worsening here, more SOB, mild distress, placed on BIPAP.  She is feeling better with BIPAP. Will call nephrology, as it appears that patient needs ASAP HD - last HD was on 12/23.   Review of Systems: As per HPI; otherwise review of systems reviewed and negative. Limited by anxiety and BIPAP.   Past Medical History:  Diagnosis Date  . Alcohol abuse   . Arthralgia    . CAD (coronary artery disease) 2006, 2016   stents when in [place]  . Chronic combined systolic and diastolic HF (heart failure) ([place])   . COPD (chronic obstructive pulmonary disease) ([place])   . Depression   . Dyspnea   . ESRD (end stage renal disease) ([place])   . HTN (hypertension)   . Hypercholesterolemia    primarily ldl-p and small particles  . Lung cancer [facility])    Patient reports this with surgery.  No details.    [name] RAS (renal artery stenosis) ([place]) 2002   by cath tysinger  . Smoking     Past Surgical History:  Procedure Laterality Date  . abdominal aortogram     perclose of the right femoral artery  . AV FISTULA PLACEMENT Right 07/11/2018   Procedure: Right arm ARTERIOVENOUS FISTULA CREATION;  Surgeon: [name], MD;  Location: [facility];  Service: Vascular;  Laterality: Right;  . AVF placement Right   . CARDIAC CATHETERIZATION     left heart catheterization.  Coronary cineangiography. Lft ventricular cineangiography.    [name] LEFT HEART CATH AND CORONARY ANGIOGRAPHY N/A 11/01/2019   Procedure: LEFT HEART CATH AND CORONARY ANGIOGRAPHY;  Surgeon: Burnell Blanks, MD;  Location: [facility];  Service: Cardiovascular;  Laterality: N/A;  . LUNG SURGERY     due to lung cancer per patient's report  . TUBAL LIGATION      Social History   Socioeconomic History  . Marital status: Divorced    Spouse name: CAR Gum  . Number of children: 5  . Years of education: 9  .  Highest education level: 9th grade  Occupational History  . Occupation: waitress  Tobacco Use  . Smoking status: Former Smoker    Years: 35.00    Types: Cigarettes    Quit date: 05/16/2019    Years since quitting: 0.4  . Smokeless tobacco: Never Used  . Tobacco comment: 3-4 cigarettes per day  Substance and Sexual Activity  . Alcohol use: Not Currently    Comment: states has quit drinking "a few months ago"  . Drug use: Never  . Sexual activity: Not Currently    Birth control/protection:  None  Other Topics Concern  . Not on file  Social History Narrative   ** Merged History Encounter **       Social Determinants of Health   Financial Resource Strain: Medium Risk  . Difficulty of Paying Living Expenses: Somewhat hard  Food Insecurity: Food Insecurity Present  . Worried About Charity fundraiser in the Last Year: Sometimes true  . Ran Out of Food in the Last Year: Sometimes true  Transportation Needs: No Transportation Needs  . Lack of Transportation (Medical): No  . Lack of Transportation (Non-Medical): No  Physical Activity: Inactive  . Days of Exercise per Week: 0 days  . Minutes of Exercise per Session: 0 min  Stress: Stress Concern Present  . Feeling of Stress : Very much  Social Connections: Somewhat Isolated  . Frequency of Communication with Friends and Family: More than three times a week  . Frequency of Social Gatherings with Friends and Family: Once a week  . Attends Religious Services: 1 to 4 times per year  . Active Member of Clubs or Organizations: No  . Attends Archivist Meetings: Never  . Marital Status: Divorced  Human resources officer Violence: Not At Risk  . Fear of Current or Ex-Partner: No  . Emotionally Abused: No  . Physically Abused: No  . Sexually Abused: No    Allergies  Allergen Reactions  . Citalopram Hydrobromide Hives  . Morphine And Related Itching  . Tape Other (See Comments)    PLASTIC, CLEAR TAPE STICKS TO THE SKIN AND POSSIBLY TEARS IT- PLEASE USE PAPER TAPE    Family History  Problem Relation Age of Onset  . Emphysema Father   . Cancer Brother     Prior to Admission medications   Medication Sig Start Date End Date Taking? Authorizing Provider  albuterol (PROVENTIL HFA;VENTOLIN HFA) 108 (90 Base) MCG/ACT inhaler Inhale 2 puffs into the lungs every 4 (four) hours as needed for wheezing or shortness of breath.  11/25/17   [provider]  aspirin EC 81 MG tablet Take 81 mg by mouth daily.    [provider]  atorvastatin (LIPITOR) 40 MG tablet Take 40 mg by mouth daily.    [provider]  AURYXIA 1 GM 210 MG(Fe) tablet Take 420-630 mg by mouth See admin instructions. Take 3 tablets (630 mg) by mouth M-W-F at bedtime. Give 3 tablets TID with meals Tue-Thu-Sat-Sun. 04/06/19   [provider]  b complex-vitamin c-folic acid (NEPHRO-VITE) 0.8 MG TABS tablet Take 1 tablet by mouth See admin instructions. Take 1 tablet by mouth in the morning on Sun/Tues/Thurs/Sat and 1 tablet after dialysis on Mon/Wed/Fri 02/06/19   [provider]  budesonide-formoterol (SYMBICORT) 160-4.5 MCG/ACT inhaler Inhale 2 puffs into the lungs 2 (two) times daily.    [provider]  buPROPion (WELLBUTRIN SR) 150 MG 12 hr tablet Take 150 mg by mouth 2 (two) times daily.  [provider]  clopidogrel (PLAVIX) 75 MG tablet Take 75 mg by mouth daily.    [provider]  ipratropium-albuterol (DUONEB) 0.5-2.5 (3) MG/3ML SOLN Take 3 mLs by nebulization every 6 (six) hours as needed. 05/11/19   Dana Allan I, MD  lidocaine-prilocaine (EMLA) cream Apply 1 application topically every Monday, Wednesday, and Friday with hemodialysis. Prior to dialysis 04/06/19   [provider]  metoprolol succinate (TOPROL-XL) 50 MG 24 hr tablet Take 50 mg by mouth daily. Take with or immediately following a meal.    [provider]  nitroGLYCERIN (NITROSTAT) 0.4 MG SL tablet Place 1 tablet (0.4 mg total) under the tongue every 5 (five) minutes as needed for chest pain. 11/06/19   Hongalgi, Lenis Dickinson, MD  Nutritional Supplements (BOOST GLUCOSE CONTROL) LIQD Take 1 Can by mouth 2 (two) times daily.    [provider]  OXYGEN Inhale 3 L/min into the lungs continuous.     [provider]  QUEtiapine (SEROQUEL) 200 MG tablet Take 200 mg by mouth at bedtime.  09/20/19   [provider]    Physical Exam: Vitals:   11/11/19 1332 11/11/19 1400  11/11/19 1615 11/11/19 1623  BP:  (!) 154/143 (!) 162/97   Pulse: (!) 122 (!) 115    Resp: (!) 30 19 (!) 23   Temp:      TempSrc:      SpO2: 100% 93%  98%  Weight:      Height:         . General:  Appears anxious on BIPAP . Eyes:  PERRL, EOMI, normal lids, iris . ENT:  grossly normal hearing, BIPAP in place . Neck:  no LAD, masses or thyromegaly . Cardiovascular:  RR with mild tachycardia, no m/r/g. No LE edema.  [name] Respiratory:   Bibasilar crackles with mild expiratory wheezing despite BIPAP.  Mildly increased respiratory effort. . Abdomen:  soft, NT, ND, NABS . Back:   normal alignment, no CVAT . Skin:  no rash or induration seen on limited exam . Musculoskeletal:  grossly normal tone BUE/BLE, good ROM, no bony abnormality . Psychiatric:  anxious mood and affect, speech limited by BIPAP, AOx3 . Neurologic:  CN 2-12 grossly intact, moves all extremities in coordinated fashion    Radiological Exams on Admission: DG Chest 2 View  Result Date: 11/11/2019 CLINICAL DATA:  68 year old female with central chest pain and increased shortness of breath this morning. History of COPD, lung cancer. Multiple negative tests for COVID-19, most recently 11/05/2019. EXAM: CHEST - 2 VIEW COMPARISON:  Portable chest 11/01/2019 and earlier. FINDINGS: PA and lateral views of the chest. Chronic large lung volumes with emphysema and postoperative changes in the left lung. Stable tortuous aorta with calcified atherosclerosis. Stable cardiac size at the upper limits of normal. Substantial regression but not complete resolution of the multifocal right lung reticulonodular opacity on 11/01/2019. Residual mostly at the lung base. No pneumothorax or pulmonary edema. No pleural effusion. No areas of worsening ventilation. No acute osseous abnormality identified. Possible aneurysmal enlargement of the abdominal aorta, lateral view roughly 4 centimeters diameter. Negative visible bowel gas pattern. IMPRESSION: 1.  Chronic lung disease with significantly improved but not completely resolved right lung pneumonia since 11/01/2019. 2. No new cardiopulmonary abnormality. 3. Extensive Aortic Atherosclerosis (ICD10-I70.0) with questionable fusiform aneurysmal enlargement of the abdominal aorta. Follow-up Aorta Ultrasound would be the simplest way to confirm or exclude AAA. Electronically Signed   By: Genevie Ann M.D.   On: 11/11/2019  08:47    EKG: Independently reviewed.   64 - NSR with rate 85; marked ST depression worse in anterior leads, prolonged QTc 502 1422 - Sinus tachcyardia with rate 107; marked ST depression in anterolateral leads   Labs on Admission: I have personally reviewed the available labs and imaging studies at the time of the admission.  Pertinent labs:   Glucose 121 BUN 77/Creatinine 10.29/GFR 3, stable from 12/21 BNP >4500 HS troponin 67, 66 WBC 9.3 Hgb 10.1 Respiratory panel negative   Assessment/Plan Principal Problem:   Volume overload Active Problems:   Essential hypertension   ESRD on hemodialysis ([name])   DM (diabetes mellitus), type 2 with renal complications ([name])   Chronic systolic CHF (congestive heart failure) ([name])   COPD (chronic obstructive pulmonary disease) ([name])   HLD (hyperlipidemia)   Depression with anxiety   Chest pain with moderate risk for cardiac etiology, hx CAD   Prolonged QT interval    Volume overload in an ESRD on HD patient -Patient presenting with acute on chronic respiratory failure, currently on BIPAP -She was recently admitted with a similar presentation and underwent cath -Suspect this is less related to heart failure and more related to lack of HD leading to volume overload -Since she is dialysis-dependent, she was unable to clear the excess fluid -He is likely to benefit from serial HD both today and tomorrow and may be appropriate for d/c to home tomorrow after HD -She may need a lower EDW -Nephrology has been consulted and will send  patient for HD soon -Nephrology prn order set utilized  CP -Similar in presentation to last admission - CP, SOB -Recent cath performed, unlikely to need repeat evaluation by cardiology at this time -Suggest repeat EKG in AM post-HD -Cardiology was asked to review EKG and has written a brief note agreeing that ongoing medical management appears to be appropriate -Continue ASA, Plavix  HTN -Continue Toprol XL -Could consider resumption of ACE since she is on HD  HLD -Continue Lipitor  Chronic systolic CHF -Volume management with HD  COPD -She does have underlying COPD and wears home O2 -She may have mild COPD exacerbation based on mild expiratory wheezing at the time of my exam -However, if present, this appears to be a lesser consideration -Can recheck after HD to determine if this remains a serious issue -For now, will continue home Symbicort [facility] per formulary); Combivent (in lieu of Duoneb)  Depression/anxiety -Continue home Wellbutrin, Seroquel -Will give another dose of Ativan for now given apparent anxiety on BIPAP  Prolonged QTc -Noted on initial EKG but improved with repeat -Will need monitoring with consideration of d/c of medications if recurrence  ?AAA -She is certainly at risk for AAA and concern was raised on CXR -Looking back, there does not appear to have been any imaging of C/A/P since 2012 -Will order vascular abdominal US for further evaluation   Note: This patient has been tested and is negative for the novel coronavirus COVID-19.  DVT prophylaxis:  Heparin Code Status:  Full - confirmed with family Family Communication:  I spoke with her son by telephone Disposition Plan: SNF once clinically improved Consults called: Nephrology; cardiology (chart review only requested); TOC team/PT Admission status: Admit - It is my clinical opinion that admission to INPATIENT is reasonable and necessary because of the expectation that this patient will require  hospital care that crosses at least 2 midnights to treat this condition based on the medical complexity of the problems presented.  Given  the aforementioned information, the predictability of an adverse outcome is felt to be significant.    Karmen Bongo MD [facility]   How to contact the [facility] Attending or Consulting provider [place] or covering provider during after hours [place], for this patient?  1. Check the care team in [facility] and look for a) attending/consulting [facility] provider listed and b) the [facility] team listed 2. Log into [email or web address] and use [place]'s universal password to access. If you do not have the password, please contact the hospital operator. 3. Locate the [facility] provider you are looking for under [facility] and page to a number that you can be directly reached. 4. If you still have difficulty reaching the provider, please page the [facility] (Director on Call) for the Hospitalists listed on amion for assistance.   11/11/2019, 4:45 PM

## 2019-11-11 NOTE — ED Notes (Addendum)
Pt found attempting to climb out of bed and remove bipap. Pt moved back in bed, bipap mask realigned and placed appropriately.

## 2019-11-11 NOTE — ED Provider Notes (Signed)
[facility] EMERGENCY DEPARTMENT Provider Note   CSN: [id number] Arrival date & time: 11/11/19  0754   History Chief Complaint  Patient presents with  . Chest Pain  . Shortness of Breath    Kari Robinson is a 68 y.o. female with extensive PMH who presents with chest pain and SOB. She is currently at [facility] rehab after being admitted to the hospital from 12/15-12/21 for similar symptoms. She underwent cardiac cath on 12/16 and medical management was recommended. She developed acute on chronic respiratory failure due to COPD/CHF exacerbation . She was not able to walk with PT and so SNF was recommended. She is concerned that staff has not been giving her her medicine correctly. She states she hasn't had a neb since she's been there. Her last dialysis session was on Wednesday and missed yesterday due to [place]. She is supposed to have dialysis today. She states that she developed substernal chest pain after breakfast this morning. It was 10/10. Was afraid to lie down because it would get worse. One of the staff members tried to give her a neb but couldn't put it together so gave her an inhaler which she said didn't help. She was given nitro which did help a little bit but she is still having chest pain. She reports associated wheezing and increased SOB from her baseline. She denies fever, chills, cough, abdominal pain, N/V, leg swelling. She has been working with PT and has been able to walk and they plan to discharge her home on Thursday but she doesn't feel ready. She reports symptoms are similar as to last time but this is "worse"  HPI   Past Medical History:  Diagnosis Date  . Alcohol abuse   . Arthralgia   . CAD (coronary artery disease) 2006, 2016   stents when in [place]  . Chronic combined systolic and diastolic HF (heart failure) ([place])   . COPD (chronic obstructive pulmonary disease) ([place])   . Depression   . Dyspnea   . ESRD (end stage renal disease)  ([place])   . HTN (hypertension)   . Hypercholesterolemia    primarily ldl-p and small particles  . Lung cancer [facility])    Patient reports this with surgery.  No details.    [name] RAS (renal artery stenosis) ([place]) 2002   by cath tysinger  . Smoking     Patient Active Problem List   Diagnosis Date Noted  . Adult failure to thrive 11/07/2019  . Prolonged QT interval 11/07/2019  . Unstable angina ([place]) 10/31/2019  . Weakness 10/20/2019  . Chest pain with moderate risk for cardiac etiology, hx CAD 10/19/2019  . Elevated troponin 10/19/2019  . Hyperkalemia 10/19/2019  . Pneumonia 07/24/2019  . Dyspnea 07/24/2019  . Lumbar stenosis 05/09/2019  . Leg weakness, bilateral 04/01/2019  . Tobacco abuse 04/01/2019  . Alcohol abuse 04/01/2019  . ESRD (end stage renal disease) on dialysis ([place]) 02/24/2019  . Seizure-like activity ([place]) 02/24/2019  . Ischemic cardiomyopathy 02/24/2019  . Type II diabetes mellitus with renal manifestations ([place]) 09/30/2018  . HLD (hyperlipidemia) 09/30/2018  . Depression with anxiety 09/30/2018  . Anemia of chronic disease 09/30/2018  . COPD (chronic obstructive pulmonary disease) ([place])   . Solitary right kidney 06/24/2018  . Chronic systolic CHF (congestive heart failure) ([place]) 06/24/2018  . CKD (chronic kidney disease) stage 5, GFR less than 15 ml/min ([name]) 06/07/2018  . Normocytic normochromic anemia 06/07/2018  . DM (diabetes mellitus), type 2 with renal complications ([place]) 12/87/8676  .  Occlusion and stenosis of carotid artery without mention of cerebral infarction 01/19/2012  . RENAL ARTERY STENOSIS 09/10/2010  . DEPRESSION 08/11/2010  . PERIPHERAL VASCULAR DISEASE 08/11/2010  . Essential hypertension 08/08/2010  . Coronary atherosclerosis 08/08/2010    Past Surgical History:  Procedure Laterality Date  . abdominal aortogram     perclose of the right femoral artery  . AV FISTULA PLACEMENT Right 07/11/2018   Procedure: Right arm ARTERIOVENOUS FISTULA  CREATION;  Surgeon: [name], MD;  Location: [facility];  Service: Vascular;  Laterality: Right;  . AVF placement Right   . CARDIAC CATHETERIZATION     left heart catheterization.  Coronary cineangiography. Lft ventricular cineangiography.    [name] LEFT HEART CATH AND CORONARY ANGIOGRAPHY N/A 11/01/2019   Procedure: LEFT HEART CATH AND CORONARY ANGIOGRAPHY;  Surgeon: Burnell Blanks, MD;  Location: [facility] CV LAB;  Service: Cardiovascular;  Laterality: N/A;  . LUNG SURGERY     due to lung cancer per patient's report  . TUBAL LIGATION       OB History   No obstetric history on file.     Family History  Problem Relation Age of Onset  . Emphysema Father   . Cancer Brother     Social History   Tobacco Use  . Smoking status: Former Smoker    Years: 35.00    Types: Cigarettes    Quit date: 05/16/2019    Years since quitting: 0.4  . Smokeless tobacco: Never Used  . Tobacco comment: 3-4 cigarettes per day  Substance Use Topics  . Alcohol use: Not Currently    Comment: states has quit drinking "a few months ago"  . Drug use: Never    Home Medications Prior to Admission medications   Medication Sig Start Date End Date Taking? Authorizing Provider  albuterol (PROVENTIL HFA;VENTOLIN HFA) 108 (90 Base) MCG/ACT inhaler Inhale 2 puffs into the lungs every 4 (four) hours as needed for wheezing or shortness of breath.  11/25/17   [provider]  aspirin EC 81 MG tablet Take 81 mg by mouth daily.    [provider]  atorvastatin (LIPITOR) 40 MG tablet Take 40 mg by mouth daily.    [provider]  AURYXIA 1 GM 210 MG(Fe) tablet Take 420-630 mg by mouth See admin instructions. Take 3 tablets (630 mg) by mouth M-W-F at bedtime. Give 3 tablets TID with meals Tue-Thu-Sat-Sun. 04/06/19   [provider]  b complex-vitamin c-folic acid (NEPHRO-VITE) 0.8 MG TABS tablet Take 1 tablet by mouth See admin instructions. Take 1 tablet by mouth in the  morning on Sun/Tues/Thurs/Sat and 1 tablet after dialysis on Mon/Wed/Fri 02/06/19   [provider]  budesonide-formoterol (SYMBICORT) 160-4.5 MCG/ACT inhaler Inhale 2 puffs into the lungs 2 (two) times daily.    [provider]  buPROPion (WELLBUTRIN SR) 150 MG 12 hr tablet Take 150 mg by mouth 2 (two) times daily.    [provider]  clopidogrel (PLAVIX) 75 MG tablet Take 75 mg by mouth daily.    [provider]  ipratropium-albuterol (DUONEB) 0.5-2.5 (3) MG/3ML SOLN Take 3 mLs by nebulization every 6 (six) hours as needed. 05/11/19   Dana Allan I, MD  lidocaine-prilocaine (EMLA) cream Apply 1 application topically every Monday, Wednesday, and Friday with hemodialysis. Prior to dialysis 04/06/19   [provider]  metoprolol succinate (TOPROL-XL) 50 MG 24 hr tablet Take 50 mg by mouth daily. Take with or immediately following a meal.  [provider]  nitroGLYCERIN (NITROSTAT) 0.4 MG SL tablet Place 1 tablet (0.4 mg total) under the tongue every 5 (five) minutes as needed for chest pain. 11/06/19   Hongalgi, Lenis Dickinson, MD  Nutritional Supplements (BOOST GLUCOSE CONTROL) LIQD Take 1 Can by mouth 2 (two) times daily.    [provider]  OXYGEN Inhale 3 L/min into the lungs continuous.     [provider]  QUEtiapine (SEROQUEL) 200 MG tablet Take 200 mg by mouth at bedtime.  09/20/19   [provider]    Allergies    Citalopram hydrobromide, Morphine and related, and Tape  Review of Systems   Review of Systems  Constitutional: Negative for fever.  Respiratory: Positive for shortness of breath and wheezing. Negative for cough.   Cardiovascular: Positive for chest pain. Negative for palpitations and leg swelling.  Gastrointestinal: Negative for abdominal pain, nausea and vomiting.  Musculoskeletal: Positive for gait problem.  Neurological: Positive for weakness (leg).  All other systems reviewed and are  negative.   Physical Exam Updated Vital Signs BP (!) 168/86 (BP Location: Left Arm)   Pulse 79   Temp 98.3 F (36.8 C) (Oral)   Resp (!) 22   SpO2 99%   Physical Exam Vitals and nursing note reviewed.  Constitutional:      General: She is not in acute distress.    Appearance: She is well-developed. She is ill-appearing (chronically ill).  HENT:     Head: Normocephalic and atraumatic.  Eyes:     General: No scleral icterus.       Right eye: No discharge.        Left eye: No discharge.     Conjunctiva/sclera: Conjunctivae normal.     Pupils: Pupils are equal, round, and reactive to light.  Cardiovascular:     Rate and Rhythm: Normal rate and regular rhythm.  Pulmonary:     Effort: Pulmonary effort is normal. Tachypnea present. No respiratory distress.     Breath sounds: Wheezing (mild expiratory) present.  Abdominal:     General: There is no distension.     Palpations: Abdomen is soft.     Tenderness: There is no abdominal tenderness.  Musculoskeletal:     Cervical back: Normal range of motion.     Right lower leg: No edema.     Left lower leg: No edema.  Skin:    General: Skin is warm and dry.  Neurological:     Mental Status: She is alert and oriented to person, place, and time.  Psychiatric:        Behavior: Behavior normal.     ED Results / Procedures / Treatments   Labs (all labs ordered are listed, but only abnormal results are displayed) Labs Reviewed  BASIC METABOLIC PANEL - Abnormal; Notable for the following components:      Result Value   Glucose, Bld 121 (*)    BUN 77 (*)    Creatinine, Ser 10.29 (*)    GFR calc non Af Amer 3 (*)    GFR calc Af Amer 4 (*)    All other components within normal limits  CBC - Abnormal; Notable for the following components:   RBC 2.99 (*)    Hemoglobin 10.1 (*)    HCT 32.7 (*)    MCV 109.4 (*)    nRBC 0.4 (*)    All other components within normal limits  BRAIN NATRIURETIC PEPTIDE - Abnormal; Notable for the  following components:   B  Natriuretic Peptide >4,500.0 (*)    All other components within normal limits  TROPONIN I (HIGH SENSITIVITY) - Abnormal; Notable for the following components:   Troponin I (High Sensitivity) 67 (*)    All other components within normal limits  TROPONIN I (HIGH SENSITIVITY) - Abnormal; Notable for the following components:   Troponin I (High Sensitivity) 66 (*)    All other components within normal limits  RESPIRATORY PANEL BY RT PCR (FLU A&B, COVID)    EKG None  Radiology DG Chest 2 View  Result Date: 11/11/2019 CLINICAL DATA:  68 year old female with central chest pain and increased shortness of breath this morning. History of COPD, lung cancer. Multiple negative tests for COVID-19, most recently 11/05/2019. EXAM: CHEST - 2 VIEW COMPARISON:  Portable chest 11/01/2019 and earlier. FINDINGS: PA and lateral views of the chest. Chronic large lung volumes with emphysema and postoperative changes in the left lung. Stable tortuous aorta with calcified atherosclerosis. Stable cardiac size at the upper limits of normal. Substantial regression but not complete resolution of the multifocal right lung reticulonodular opacity on 11/01/2019. Residual mostly at the lung base. No pneumothorax or pulmonary edema. No pleural effusion. No areas of worsening ventilation. No acute osseous abnormality identified. Possible aneurysmal enlargement of the abdominal aorta, lateral view roughly 4 centimeters diameter. Negative visible bowel gas pattern. IMPRESSION: 1. Chronic lung disease with significantly improved but not completely resolved right lung pneumonia since 11/01/2019. 2. No new cardiopulmonary abnormality. 3. Extensive Aortic Atherosclerosis (ICD10-I70.0) with questionable fusiform aneurysmal enlargement of the abdominal aorta. Follow-up Aorta Ultrasound would be the simplest way to confirm or exclude AAA. Electronically Signed   By: Genevie Ann M.D.   On: 11/11/2019 08:47     Procedures Procedures (including critical care time)  Medications Ordered in ED Medications  sodium chloride flush (NS) 0.9 % injection 3 mL (has no administration in time range)  HYDROcodone-acetaminophen (NORCO/VICODIN) 5-325 MG per tablet 1 tablet (has no administration in time range)    ED Course  I have reviewed the triage vital signs and the nursing notes.  Pertinent labs & imaging results that were available during my care of the patient were reviewed by me and considered in my medical decision making (see chart for details).  68 year old female presents with worsening CP/SOB since this morning at her SNF. Symptoms are similar to prior presentations although pt reports symptoms are "worse this time". She is chronically ill appearing. HR is normal. BP is elevated. She is tachynpeic with mild wheezes but maintaining sats on her baseline 4L of O2. EKG shows diffuse ST depressions with artifact which appears similar to prior. CXR shows COPD with a resolving RLL pneumonia although it does not appear she was treated for pneumonia on her last admission. This may represent fluid? CBC shows anemia which is around her baseline. BMP shows elevated Scr from her ESRD. She missed dialysis today although K is normal. Trop is 48 which is lower than when she was in the hospital last week. BNP is >4500. Will order pain medicine, breathing tx, another dose of nitro since this reportedly helped her earlier. Her symptoms are likely multifactorial from her multiple medical problems. Shared visit with Dr. Roslynn Amble.   12:46 PM Pt reportedly having more trouble breathing now and very anxious and tripoding per her nurse tech. Repeat EKG is similar to prior - has diffuse ST depressions. Will try dose of Ativan and try inhalers.  1:43 PM Called in to room again. HR is  in the 120s and sats have dropped to 93%. She is in respiratory distress and lung sounds are decreased and there is mild wheezing. Switched to NRB.  She did have inhalers and Ativan with no improvement. Pt re-evaluated by Dr. Roslynn Amble as well - will order Bipap and discuss with respiratory.  Respiratory at bedside and trialing Bipap with dubneb and steroids. Discussed with Dr. Curt Bears with Cards who will come to see as well.  2:26 PM Rechecked pt. She is doing somewhat better on Bipap. HR is in low 100s. Sats are 94%. Will request admission.  2:52 PM Discussed with Dr. Lorin Mercy she will admit for acute on chronic respiratory failure. Discussed with Dr. Clover Mealy with Nephrology who will come to see pt to evaluate for emergent dialysis.  MDM Rules/Calculators/A&P                       Final Clinical Impression(s) / ED Diagnoses Final diagnoses:  Acute on chronic respiratory failure, unspecified whether with hypoxia or hypercapnia ([place])  ESRD (end stage renal disease) [facility])    Rx / DC Orders ED Discharge Orders    None       Recardo Evangelist, PA-C 11/11/19 1453    Lucrezia Starch, MD 11/12/19 1434

## 2019-11-12 ENCOUNTER — Encounter (HOSPITAL_COMMUNITY): Payer: Self-pay | Admitting: Internal Medicine

## 2019-11-12 DIAGNOSIS — I714 Abdominal aortic aneurysm, without rupture, unspecified: Secondary | ICD-10-CM

## 2019-11-12 DIAGNOSIS — I5022 Chronic systolic (congestive) heart failure: Secondary | ICD-10-CM

## 2019-11-12 DIAGNOSIS — E8779 Other fluid overload: Secondary | ICD-10-CM

## 2019-11-12 HISTORY — DX: Abdominal aortic aneurysm, without rupture, unspecified: I71.40

## 2019-11-12 HISTORY — DX: Abdominal aortic aneurysm, without rupture: I71.4

## 2019-11-12 LAB — BASIC METABOLIC PANEL
Anion gap: 14 (ref 5–15)
BUN: 43 mg/dL — ABNORMAL HIGH (ref 8–23)
CO2: 28 mmol/L (ref 22–32)
Calcium: 9 mg/dL (ref 8.9–10.3)
Chloride: 99 mmol/L (ref 98–111)
Creatinine, Ser: 6.98 mg/dL — ABNORMAL HIGH (ref 0.44–1.00)
GFR calc Af Amer: 6 mL/min — ABNORMAL LOW (ref >60)
GFR calc non Af Amer: 5 mL/min — ABNORMAL LOW (ref >60)
Glucose, Bld: 95 mg/dL (ref 70–99)
Potassium: 5.2 mmol/L — ABNORMAL HIGH (ref 3.5–5.1)
Sodium: 141 mmol/L (ref 135–145)

## 2019-11-12 LAB — CBC WITH DIFFERENTIAL/PLATELET
Abs Immature Granulocytes: 0.04 10*3/uL (ref 0.00–0.07)
Basophils Absolute: 0 10*3/uL (ref 0.0–0.1)
Basophils Relative: 0 %
Eosinophils Absolute: 0 10*3/uL (ref 0.0–0.5)
Eosinophils Relative: 0 %
HCT: 30.4 % — ABNORMAL LOW (ref 36.0–46.0)
Hemoglobin: 9.5 g/dL — ABNORMAL LOW (ref 12.0–15.0)
Immature Granulocytes: 1 %
Lymphocytes Relative: 9 %
Lymphs Abs: 0.5 10*3/uL — ABNORMAL LOW (ref 0.7–4.0)
MCH: 33.3 pg (ref 26.0–34.0)
MCHC: 31.3 g/dL (ref 30.0–36.0)
MCV: 106.7 fL — ABNORMAL HIGH (ref 80.0–100.0)
Monocytes Absolute: 0.5 10*3/uL (ref 0.1–1.0)
Monocytes Relative: 9 %
Neutro Abs: 4.6 10*3/uL (ref 1.7–7.7)
Neutrophils Relative %: 81 %
Platelets: 240 10*3/uL (ref 150–400)
RBC: 2.85 MIL/uL — ABNORMAL LOW (ref 3.87–5.11)
RDW: 15.6 % — ABNORMAL HIGH (ref 11.5–15.5)
WBC: 5.7 10*3/uL (ref 4.0–10.5)
nRBC: 0.9 % — ABNORMAL HIGH (ref 0.0–0.2)

## 2019-11-12 MED ORDER — SODIUM ZIRCONIUM CYCLOSILICATE 10 G PO PACK
10.0000 g | PACK | Freq: Once | ORAL | Status: AC
  Administered 2019-11-12: 10 g via ORAL
  Filled 2019-11-12: qty 1

## 2019-11-12 NOTE — Plan of Care (Signed)

## 2019-11-12 NOTE — Progress Notes (Signed)
Administered daily meds with lunch, could not give as scheduled due to administration of Lokelma.   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 Patient stated receiving PT at [facility]. May need PT at home upon discharge; [place] informed.

## 2019-11-12 NOTE — Progress Notes (Addendum)
CSW was alerted that patient is medically stable for discharge to back to [facility]. CSW met with patient at bedside to discuss her discharge plan. Patient stated that she was agreeable to return back to [facility] and informed CSW that she was supposed to be discharged from [facility] tomorrow. CSW informed patient that she would verify with SNF. CSW attempted to follow up with [facility] and had to leave a message,phone did not ring went straight to voicemail..  12:01pm- tried to call [facility] admissions again and still going straight to voice mail.  3:30: spoke with admission director and was informed that patient would not be able to return today however could return tomorrow. CSW inquired about patient's discharge from facility and was informed that it was later in the week.CSW update RN.  CSW will continue to follow up for discharge planning needs.

## 2019-11-12 NOTE — Progress Notes (Signed)
PT Cancellation Note  Patient Details Name: Kari Robinson MRN: [id number] DOB: [birth year]   Cancelled Treatment:    Reason Eval/Treat Not Completed: Patient declined, no reason specified Pt reports feeling hungry and tired. Wants to eat something (NPO awaiting MD). Refused PT services and became quickly angry when attempted to explain purpose of PT evaluation. Will follow.   Marguarite Arbour A Arcelia Pals 11/12/2019, 8:42 AM Marisa Severin, PT, DPT Acute Rehabilitation Services Pager [phone] Office [phone]

## 2019-11-12 NOTE — Progress Notes (Signed)
Patient ID: Kari Robinson, female   DOB: Mar 16, 1951, 68 y.o.   MRN: [id number]  [facility] Progress Note   Assessment/ Plan:   1.  Acute on chronic hypoxic respiratory failure: Secondary to volume overload with indiscretion of diet/fluid intake prior to hospitalization.  Underwent urgent hemodialysis yesterday while on NIPPV. 2. ESRD: She is usually on a Monday/Wednesday/Friday dialysis schedule and underwent hemodialysis emergently yesterday for volume overload and worsening respiratory distress.  I will give her a single dose of Lokelma prior to discharge with instructions for her to keep her appointment with outpatient dialysis tomorrow.  Suitable from a renal standpoint to discharge. 3. Anemia: With borderline low hemoglobin/hematocrit without overt loss, monitor on ESA 4. CKD-MBD: Elevated phosphorus level noted with acceptable calcium, continue ferric citrate for phosphorus binding. 5. Nutrition: She is now off of BiPAP with breathing back to baseline, resume renal diet. 6. Hypertension: Blood pressures improving with ultrafiltration/HD  Subjective:   Reports to be feeling 100% better with breathing back to baseline.  Denies any chest pain and would like to eat something.   Objective:   BP 134/80 (BP Location: Left Arm)   Pulse 79   Temp 97.8 F (36.6 C) (Oral)   Resp 20   Ht 5\' 4"  (1.626 m)   Wt 71.9 kg Comment: scale b  SpO2 98%   BMI 27.21 kg/m   Physical Exam: Gen: Comfortably resting in bed, on oxygen via nasal cannula CVS: Pulse regular rhythm, normal rate, S1 and S2 normal Resp: Poor inspiratory effort with decreased breath sounds over bases otherwise clear to auscultation, no rales Abd: Soft, flat, nontender Ext: No lower extremity edema  Labs: BMET Recent Labs  Lab 11/06/19 0805 11/11/19 0807 11/12/19 0604  NA 139 139 141  K 4.1 4.7 5.2*  CL 97* 99 99  CO2 24 25 28   GLUCOSE 93 121* 95  BUN 82* 77* 43*  CREATININE 10.71* 10.29* 6.98*   CALCIUM 9.1 9.9 9.0  PHOS 6.2*  --   --    CBC Recent Labs  Lab 11/06/19 0805 11/11/19 0807 11/12/19 0604  WBC 7.3 9.3 5.7  NEUTROABS  --   --  4.6  HGB 9.7* 10.1* 9.5*  HCT 30.3* 32.7* 30.4*  MCV 105.9* 109.4* 106.7*  PLT 238 258 240   Medications:    . aspirin EC  81 mg Oral Daily  . atorvastatin  40 mg Oral Daily  . buPROPion  150 mg Oral BID  . Chlorhexidine Gluconate Cloth  6 each Topical Q0600  . clopidogrel  75 mg Oral Daily  . ferric citrate  630 mg Oral 3 times per day on Sun Tue Thu Sat  . [START ON 11/13/2019] ferric citrate  630 mg Oral Q M,W,F-2000  . heparin  5,000 Units Subcutaneous Q8H  . ipratropium-albuterol  3 mL Nebulization Q6H  . LORazepam  1 mg Intravenous Once  . metoprolol succinate  50 mg Oral Daily  . mometasone-formoterol  2 puff Inhalation BID  . multivitamin  1 tablet Oral QHS  . QUEtiapine  200 mg Oral QHS  . sodium chloride flush  3 mL Intravenous Q12H   Elmarie Shiley, MD 11/12/2019, 8:17 AM

## 2019-11-12 NOTE — Progress Notes (Signed)
Pt BIPAP is PRN and she is not in need of it at this time. RT will continue to monitor.

## 2019-11-12 NOTE — Progress Notes (Signed)
PROGRESS NOTE    Kari Robinson  ACZ:[id number] DOB: 03/13/1951 DOA: 11/11/2019 PCP: Tsosie Billing, MD (Inactive)   Brief Narrative: Kari Robinson is a 68 y.o. female with medical history significant of CAD status post stents, NSTEMI 09/2017, ischemic cardiomyopathy, chronic systolic CHF (EF improved from 25% to 45-50%), essential hypertension, hyperlipidemia, COPD on home oxygen 3 L/min, lung cancer s/p left lung resection 2017, ESRD on MWF HD, PAF, and tobacco abuse presenting with recurrent CP/SOB - had tx w/ BiPAP and urgent HD and much better   Assessment & Plan:   Principal Problem:   Volume overload Active Problems:   Essential hypertension   ESRD on hemodialysis ([place])   DM (diabetes mellitus), type 2 with renal complications ([place])   Chronic systolic CHF (congestive heart failure) ([place])   COPD (chronic obstructive pulmonary disease) ([place])   HLD (hyperlipidemia)   Depression with anxiety   Chest pain with moderate risk for cardiac etiology, hx CAD   Prolonged QT interval   AAA (abdominal aortic aneurysm) ([name])  Volume overload with severe dyspnea - treated w/ urgent hemodialysis - sxs and problem resolved - for dc tomorrow and HD at usual center - unable to dc today per SW will be ok to go to [facility] 12/28  CHF/CAD - Plavix, ASA  - metoprolol  Chest pain - resolved  AAA  - [place] 10/2020    DVT prophylaxis: Heparin Code Status: Full Family Communication: none Disposition Plan: [facility] 11/13/19   Consultants:   Nephrology   Procedures:  Hemodialsysi - urgent 12/26-27  Antimicrobials:   none    Subjective: Feeling much better after hemodialysis. Breathing back to baseline and off BiPAP OK for dc per renal - patient thought she was to leave [facility] tomorrow but after some phone calss we found that it is next week - unable to go to SNF today   Objective: Vitals:   11/12/19 0805 11/12/19 1148 11/12/19 1449 11/12/19 1655  BP:  134/80 132/78  140/84  Pulse: 79 89  84  Resp: 20 18  20   Temp: 97.8 F (36.6 C) 98.6 F (37 C)  98 F (36.7 C)  TempSrc: Oral Oral  Oral  SpO2: 98% 100% 100% 99%  Weight:      Height:        Intake/Output Summary (Last 24 hours) at 11/12/2019 1750 Last data filed at 11/12/2019 1330 Gross per 24 hour  Intake 540 ml  Output 2900 ml  Net -2360 ml   Filed Weights   11/11/19 1840 11/11/19 2220 11/12/19 0533  Weight: 75 kg 72.1 kg 71.9 kg    Examination:  @BP  140/84 (BP Location: Left Arm)   Pulse 84   Temp 98 F (36.7 C) (Oral)   Resp 20   Ht 5\' 4"  (1.626 m)   Wt 71.9 kg Comment: scale b  SpO2 99%   BMI 27.21 kg/m @  General:  NAD Eyes:   anicteric Lungs:  clear Heart::  S1S2 no rubs, murmurs or gallops Abdomen:  soft and nontender, BS+ Ext:   no edema, cyanosis or clubbing   Data Reviewed: I have personally reviewed following labs and imaging studies  CBC: Recent Labs  Lab 11/06/19 0805 11/11/19 0807 11/12/19 0604  WBC 7.3 9.3 5.7  NEUTROABS  --   --  4.6  HGB 9.7* 10.1* 9.5*  HCT 30.3* 32.7* 30.4*  MCV 105.9* 109.4* 106.7*  PLT 238 258 109   Basic Metabolic Panel: Recent Labs  Lab  11/06/19 0805 11/11/19 0807 11/12/19 0604  NA 139 139 141  K 4.1 4.7 5.2*  CL 97* 99 99  CO2 24 25 28   GLUCOSE 93 121* 95  BUN 82* 77* 43*  CREATININE 10.71* 10.29* 6.98*  CALCIUM 9.1 9.9 9.0  PHOS 6.2*  --   --    GFR: Estimated Creatinine Clearance: 7.4 mL/min (A) (by C-G formula based on SCr of 6.98 mg/dL (H)). Liver Function Tests: Recent Labs  Lab 11/06/19 0805  ALBUMIN 3.2*   CBG: Recent Labs  Lab 11/05/19 2151 11/06/19 0615 11/06/19 1201 11/11/19 2256  GLUCAP 116* 106* 131* 130*     Recent Results (from the past 240 hour(s))  SARS CORONAVIRUS 2 (TAT 6-24 HRS) Nasopharyngeal Nasopharyngeal Swab     Status: None   Collection Time: 11/05/19 10:46 AM   Specimen: Nasopharyngeal Swab  Result Value Ref Range Status   SARS Coronavirus 2  NEGATIVE NEGATIVE Final    Comment: (NOTE) SARS-CoV-2 target nucleic acids are NOT DETECTED. The SARS-CoV-2 RNA is generally detectable in upper and lower respiratory specimens during the acute phase of infection. Negative results do not preclude SARS-CoV-2 infection, do not rule out co-infections with other pathogens, and should not be used as the sole basis for treatment or other patient management decisions. Negative results must be combined with clinical observations, patient history, and epidemiological information. The expected result is Negative. Fact Sheet for Patients: SugarRoll.be Fact Sheet for Healthcare Providers: https://www.woods-mathews.com/ This test is not yet approved or cleared by the [place] FDA and  has been authorized for detection and/or diagnosis of SARS-CoV-2 by FDA under an Emergency Use Authorization (EUA). This EUA will remain  in effect (meaning this test can be used) for the duration of the COVID-19 declaration under Section 56 4(b)(1) of the Act, 21 U.S.C. section 360bbb-3(b)(1), unless the authorization is terminated or revoked sooner. Performed at [facility], [place] [address], [place], [place] [address]   Respiratory Panel by RT PCR (Flu A&B, Covid) - Nasopharyngeal Swab     Status: None   Collection Time: 11/11/19  1:41 PM   Specimen: Nasopharyngeal Swab  Result Value Ref Range Status   SARS Coronavirus 2 by RT PCR NEGATIVE NEGATIVE Final    Comment: (NOTE) SARS-CoV-2 target nucleic acids are NOT DETECTED. The SARS-CoV-2 RNA is generally detectable in upper respiratoy specimens during the acute phase of infection. The lowest concentration of SARS-CoV-2 viral copies this assay can detect is 131 copies/mL. A negative result does not preclude SARS-Cov-2 infection and should not be used as the sole basis for treatment or other patient management decisions. A negative result may occur with    improper specimen collection/handling, submission of specimen other than nasopharyngeal swab, presence of viral mutation(s) within the areas targeted by this assay, and inadequate number of viral copies (<131 copies/mL). A negative result must be combined with clinical observations, patient history, and epidemiological information. The expected result is Negative. Fact Sheet for Patients:  PinkCheek.be Fact Sheet for Healthcare Providers:  GravelBags.it This test is not yet ap proved or cleared by the [place] FDA and  has been authorized for detection and/or diagnosis of SARS-CoV-2 by FDA under an Emergency Use Authorization (EUA). This EUA will remain  in effect (meaning this test can be used) for the duration of the COVID-19 declaration under Section 564(b)(1) of the Act, 21 U.S.C. section 360bbb-3(b)(1), unless the authorization is terminated or revoked sooner.    Influenza A by PCR NEGATIVE NEGATIVE  Final   Influenza B by PCR NEGATIVE NEGATIVE Final    Comment: (NOTE) The Xpert Xpress SARS-CoV-2/FLU/RSV assay is intended as an aid in  the diagnosis of influenza from Nasopharyngeal swab specimens and  should not be used as a sole basis for treatment. Nasal washings and  aspirates are unacceptable for Xpert Xpress SARS-CoV-2/FLU/RSV  testing. Fact Sheet for Patients: PinkCheek.be Fact Sheet for Healthcare Providers: GravelBags.it This test is not yet approved or cleared by the [place] FDA and  has been authorized for detection and/or diagnosis of SARS-CoV-2 by  FDA under an Emergency Use Authorization (EUA). This EUA will remain  in effect (meaning this test can be used) for the duration of the  Covid-19 declaration under Section 564(b)(1) of the Act, 21  U.S.C. section 360bbb-3(b)(1), unless the authorization is  terminated or revoked. Performed at  [facility], [place] [address], [place], [place] [address]          Radiology Studies: DG Chest 2 View  Result Date: 11/11/2019 CLINICAL DATA:  68 year old female with central chest pain and increased shortness of breath this morning. History of COPD, lung cancer. Multiple negative tests for COVID-19, most recently 11/05/2019. EXAM: CHEST - 2 VIEW COMPARISON:  Portable chest 11/01/2019 and earlier. FINDINGS: PA and lateral views of the chest. Chronic large lung volumes with emphysema and postoperative changes in the left lung. Stable tortuous aorta with calcified atherosclerosis. Stable cardiac size at the upper limits of normal. Substantial regression but not complete resolution of the multifocal right lung reticulonodular opacity on 11/01/2019. Residual mostly at the lung base. No pneumothorax or pulmonary edema. No pleural effusion. No areas of worsening ventilation. No acute osseous abnormality identified. Possible aneurysmal enlargement of the abdominal aorta, lateral view roughly 4 centimeters diameter. Negative visible bowel gas pattern. IMPRESSION: 1. Chronic lung disease with significantly improved but not completely resolved right lung pneumonia since 11/01/2019. 2. No new cardiopulmonary abnormality. 3. Extensive Aortic Atherosclerosis (ICD10-I70.0) with questionable fusiform aneurysmal enlargement of the abdominal aorta. Follow-up Aorta Ultrasound would be the simplest way to confirm or exclude AAA. Electronically Signed   By: Genevie Ann M.D.   On: 11/11/2019 08:47   US AORTA  Result Date: 11/12/2019 CLINICAL DATA:  AAA seen on chest imaging EXAM: ULTRASOUND OF ABDOMINAL AORTA TECHNIQUE: Ultrasound examination of the abdominal aorta and proximal common iliac arteries was performed to evaluate for aneurysm. Additional color and Doppler images of the distal aorta were obtained to document patency. COMPARISON:  Radiograph 03/12/2019 FINDINGS: Abdominal aortic measurements as follows:  Proximal:  2.9 x 3.1 cm Mid:  4.1 x 3.5 cm Distal:  3.0 x 2.5 cm Patent: Yes, peak systolic velocity is 44 cm/s Right common iliac artery: Not visualized Left common iliac artery: Not visualized IMPRESSION: Fusiform midabdominal aortic aneurysm of 4.1 cm. Recommend followup by ultrasound in 1 year. This recommendation follows ACR consensus guidelines: White Paper of the ACR Incidental Findings Committee II on Vascular Findings. J Am Coll Radiol 2013; 10:789-794. Aortic aneurysm NOS (ICD10-I71.9) Electronically Signed   By: Lovena Le M.D.   On: 11/12/2019 02:02        Scheduled Meds: . aspirin EC  81 mg Oral Daily  . atorvastatin  40 mg Oral Daily  . buPROPion  150 mg Oral BID  . Chlorhexidine Gluconate Cloth  6 each Topical Q0600  . clopidogrel  75 mg Oral Daily  . ferric citrate  630 mg Oral 3 times per day on Sun Tue Thu  Sat  . [START ON 11/13/2019] ferric citrate  630 mg Oral Q M,W,F-2000  . heparin  5,000 Units Subcutaneous Q8H  . ipratropium-albuterol  3 mL Nebulization Q6H  . LORazepam  1 mg Intravenous Once  . metoprolol succinate  50 mg Oral Daily  . mometasone-formoterol  2 puff Inhalation BID  . multivitamin  1 tablet Oral QHS  . QUEtiapine  200 mg Oral QHS  . sodium chloride flush  3 mL Intravenous Q12H   Continuous Infusions: . sodium chloride       LOS: 1 day        Silvano Rusk, MD [facility] - covering Pager [phone]  If 7PM-7AM, please contact night-coverage [email or web address] Password [email or web address] 11/12/2019, 5:50 PM

## 2019-11-13 LAB — BASIC METABOLIC PANEL
Anion gap: 18 — ABNORMAL HIGH (ref 5–15)
BUN: 78 mg/dL — ABNORMAL HIGH (ref 8–23)
CO2: 25 mmol/L (ref 22–32)
Calcium: 8.9 mg/dL (ref 8.9–10.3)
Chloride: 97 mmol/L — ABNORMAL LOW (ref 98–111)
Creatinine, Ser: 9.44 mg/dL — ABNORMAL HIGH (ref 0.44–1.00)
GFR calc Af Amer: 4 mL/min — ABNORMAL LOW (ref >60)
GFR calc non Af Amer: 4 mL/min — ABNORMAL LOW (ref >60)
Glucose, Bld: 88 mg/dL (ref 70–99)
Potassium: 4.6 mmol/L (ref 3.5–5.1)
Sodium: 140 mmol/L (ref 135–145)

## 2019-11-13 NOTE — TOC Transition Note (Signed)
Transition of Care [facility]) - CM/SW Discharge Note   Patient Details  Name: Kari Robinson MRN: [id number] Date of Birth: 09/17/1951  Transition of Care [facility]) CM/SW Contact:  Alberteen Sam, LCSW Phone Number: 11/13/2019, 10:32 AM   Clinical Narrative:     Patient will DC EH:[id number] Anticipated DC date: 11/13/2019 Family notified:Joseph (son)  Transport IB:[id number]  Per MD patient ready for DC to [facility] . RN, patient, patient's family, and facility notified of DC. Discharge Summary sent to facility. RN given number for report [phone]   . DC packet on chart. Ambulance transport requested for patient.  CSW signing off.  [place], [place]   Final next level of care: Skilled Nursing Facility Barriers to Discharge: No Barriers Identified   Patient Goals and CMS Choice Patient states their goals for this hospitalization and ongoing recovery are:: to go back to SNF CMS Medicare.gov Compare Post Acute Care list provided to:: Patient Choice offered to / list presented to : Patient  Discharge Placement PASRR number recieved: 11/13/19            Patient chooses bed at: [facility] Patient to be transferred to facility by: [facility] Name of family member notified: [name] (son) Patient and family notified of of transfer: 11/13/19  Discharge Plan and Services                                     Social Determinants of Health (SDOH) Interventions     Readmission Risk Interventions Readmission Risk Prevention Plan 07/04/2019  Transportation Screening Complete  Medication Review Press photographer) Complete  HRI or Home Care Consult Complete  Palliative Care Screening Not Applicable  Some recent data might be hidden

## 2019-11-13 NOTE — Progress Notes (Addendum)
  [facility] Progress Note   Subjective:  Seen in room. Feels much better today. Breathing back to baseline. No complaints. Awaiting discharge back to SNF   Objective Vitals:   11/13/19 0301 11/13/19 0459 11/13/19 0815 11/13/19 0913  BP:  125/86 140/70   Pulse: 77 77 76   Resp: 18 18 20    Temp:  (!) 97.4 F (36.3 C) (!) 97.5 F (36.4 C)   TempSrc:  Oral Oral   SpO2: 100% 100% 100% 94%  Weight:      Height:        Physical Exam General: Alert, sitting up in bed, nad, on nasal oxygen  Heart: RRR  Lungs: Left sided wheezes otherwise Clear bilaterally  Abdomen: soft non-tender  Extremities: No LE edema Dialysis Access: R forearm AVF +bruit    Weight change:    Additional Objective Labs: Basic Metabolic Panel: Recent Labs  Lab 11/11/19 0807 11/12/19 0604 11/13/19 0454  NA 139 141 140  K 4.7 5.2* 4.6  CL 99 99 97*  CO2 25 28 25   GLUCOSE 121* 95 88  BUN 77* 43* 78*  CREATININE 10.29* 6.98* 9.44*  CALCIUM 9.9 9.0 8.9   CBC: Recent Labs  Lab 11/11/19 0807 11/12/19 0604  WBC 9.3 5.7  NEUTROABS  --  4.6  HGB 10.1* 9.5*  HCT 32.7* 30.4*  MCV 109.4* 106.7*  PLT 258 240   Blood Culture No results found for: SDES, SPECREQUEST, CULT, REPTSTATUS   Medications: . sodium chloride     . aspirin EC  81 mg Oral Daily  . atorvastatin  40 mg Oral Daily  . buPROPion  150 mg Oral BID  . Chlorhexidine Gluconate Cloth  6 each Topical Q0600  . clopidogrel  75 mg Oral Daily  . ferric citrate  630 mg Oral 3 times per day on Sun Tue Thu Sat  . ferric citrate  630 mg Oral Q M,W,F-2000  . heparin  5,000 Units Subcutaneous Q8H  . ipratropium-albuterol  3 mL Nebulization Q6H  . LORazepam  1 mg Intravenous Once  . metoprolol succinate  50 mg Oral Daily  . mometasone-formoterol  2 puff Inhalation BID  . multivitamin  1 tablet Oral QHS  . QUEtiapine  200 mg Oral QHS  . sodium chloride flush  3 mL Intravenous Q12H    Dialysis Orders:  [facility] MWF 3.5h 350/800  EDW 71.5kg 2K/2Ca UFP4  R AVF No heparin  Mircera 100 mcg IV q 2 weeks. Last 12/23 Hectorol 1 mcg IV q HD   1. Acute on chronic respiratory failure. Requiring BiPAP on admission - Improved with dialysis. Back to baseline resp status.  2. CP. Hx of stents. Recent cardiac cath. EKG unchanged from prior. Likely related to #1. Resolved.  3. ESRD -  HD MWF. Next HD 12/28 at outpatient center.  4. Hypertension/volume  - BP stable. Net UF 2.9L Post HD wt 72.1kg on 12/26.  UF to EDW as tolerted.  5. Anemia  - On ESA as outpatient. Follow trends.  6. Metabolic bone disease -  Continue home binders/VDRA  7. Nutrition - Renal diet/Fluid restrictions  8. COPD on chronic home O2  9. Dispo:  Stable for discharge from renal standpoint. Awaiting return to SNF.   Lynnda Child PA-C [facility] Pager [phone] 11/13/2019,10:24 AM  LOS: 2 days

## 2019-11-13 NOTE — NC FL2 (Signed)
[place] LEVEL OF CARE SCREENING TOOL     IDENTIFICATION  Patient Name: Kari Robinson Birthdate: 1951-05-05 Sex: female Admission Date (Current Location): 11/11/2019  [facility] and [place] Number:  Herbalist and Address:  [facility], [place] [address], [place], [place] [address]      Provider Number: [id number]  Attending Physician Name and Address:  Antonieta Pert, MD  Relative Name and Phone Number:  [name] (son) [phone]    Current Level of Care: Hospital Recommended Level of Care: [place] Prior Approval Number:    Date Approved/Denied:   PASRR Number: [id number]  Discharge Plan: SNF    Current Diagnoses: Patient Active Problem List   Diagnosis Date Noted  . AAA (abdominal aortic aneurysm) ([place]) 11/12/2019  . Volume overload 11/11/2019  . Adult failure to thrive 11/07/2019  . Prolonged QT interval 11/07/2019  . Unstable angina ([place]) 10/31/2019  . Weakness 10/20/2019  . Chest pain with moderate risk for cardiac etiology, hx CAD 10/19/2019  . Elevated troponin 10/19/2019  . Hyperkalemia 10/19/2019  . Pneumonia 07/24/2019  . Dyspnea 07/24/2019  . Lumbar stenosis 05/09/2019  . Leg weakness, bilateral 04/01/2019  . Tobacco abuse 04/01/2019  . Alcohol abuse 04/01/2019  . Seizure-like activity ([place]) 02/24/2019  . Ischemic cardiomyopathy 02/24/2019  . Type II diabetes mellitus with renal manifestations ([place]) 09/30/2018  . HLD (hyperlipidemia) 09/30/2018  . Depression with anxiety 09/30/2018  . Anemia of chronic disease 09/30/2018  . COPD (chronic obstructive pulmonary disease) ([place])   . Solitary right kidney 06/24/2018  . Chronic systolic CHF (congestive heart failure) ([place]) 06/24/2018  . ESRD on hemodialysis ([place]) 06/07/2018  . Normocytic normochromic anemia 06/07/2018  . DM (diabetes mellitus), type 2 with renal complications ([place]) 63/84/5364  . Occlusion and stenosis of carotid artery without  mention of cerebral infarction 01/19/2012  . RENAL ARTERY STENOSIS 09/10/2010  . DEPRESSION 08/11/2010  . PERIPHERAL VASCULAR DISEASE 08/11/2010  . Essential hypertension 08/08/2010  . Coronary atherosclerosis 08/08/2010    Orientation RESPIRATION BLADDER Height & Weight     Self, Time, Situation, Place  O2(Nasal cannula 3L) Continent, External catheter Weight: 158 lb 8 oz (71.9 kg)(scale b) Height:  5\' 4"  (162.6 cm)  BEHAVIORAL SYMPTOMS/MOOD NEUROLOGICAL BOWEL NUTRITION STATUS      Continent Diet(Please see DC summary)  AMBULATORY STATUS COMMUNICATION OF NEEDS Skin   Extensive Assist Verbally Normal                       Personal Care Assistance Level of Assistance  Bathing, Feeding, Dressing, Total care Bathing Assistance: Limited assistance Feeding assistance: Independent Dressing Assistance: Limited assistance Total Care Assistance: Limited assistance   Functional Limitations Info  Sight, Hearing, Speech Sight Info: Impaired Hearing Info: Adequate Speech Info: Adequate    SPECIAL CARE FACTORS FREQUENCY  PT (By licensed PT), OT (By licensed OT)     PT Frequency: min 5x weekly OT Frequency: min 5x weekly            Contractures Contractures Info: Not present    Additional Factors Info  Code Status, Allergies Code Status Info: full Allergies Info: Citalopram Hydrobromind, Morphine and related, tape           Current Medications (11/13/2019):  This is the current hospital active medication list Current Facility-Administered Medications  Medication Dose Route Frequency Provider Last Rate Last Admin  . 0.9 %  sodium chloride infusion  250 mL Intravenous PRN Lorin Mercy,  Anderson Malta, MD      . acetaminophen (TYLENOL) tablet 650 mg  650 mg Oral Q6H PRN Karmen Bongo, MD       Or  . acetaminophen (TYLENOL) suppository 650 mg  650 mg Rectal Q6H PRN Karmen Bongo, MD      . albuterol (PROVENTIL) (2.5 MG/3ML) 0.083% nebulizer solution 3 mL  3 mL Inhalation Q4H PRN  Karmen Bongo, MD      . aspirin EC tablet 81 mg  81 mg Oral Daily Karmen Bongo, MD   81 mg at 11/12/19 1257  . atorvastatin (LIPITOR) tablet 40 mg  40 mg Oral Daily Karmen Bongo, MD   40 mg at 11/12/19 1256  . buPROPion [facility] SR) 12 hr tablet 150 mg  150 mg Oral BID Karmen Bongo, MD   150 mg at 11/12/19 2106  . calcium carbonate (dosed in mg elemental calcium) suspension 500 mg of elemental calcium  500 mg of elemental calcium Oral Q6H PRN Karmen Bongo, MD      . camphor-menthol [facility]) lotion 1 application  1 application Topical R6V PRN Karmen Bongo, MD       And  . hydrOXYzine (ATARAX/VISTARIL) tablet 25 mg  25 mg Oral Q8H PRN Karmen Bongo, MD      . Chlorhexidine Gluconate Cloth 2 % PADS 6 each  6 each Topical Q0600 Lynnda Child, PA-C   6 each at 11/12/19 1445  . clopidogrel (PLAVIX) tablet 75 mg  75 mg Oral Daily Karmen Bongo, MD   75 mg at 11/12/19 1257  . docusate sodium (ENEMEEZ) enema 283 mg  1 enema Rectal PRN Karmen Bongo, MD      . feeding supplement (NEPRO CARB STEADY) liquid 237 mL  237 mL Oral TID PRN Karmen Bongo, MD      . ferric citrate (AURYXIA) tablet 630 mg  630 mg Oral 3 times per day on Sun Tue Thu Sat Karmen Bongo, MD   630 mg at 11/12/19 2106  . ferric citrate (AURYXIA) tablet 630 mg  630 mg Oral Q M,W,F-2000 Karmen Bongo, MD      . heparin injection 5,000 Units  5,000 Units Subcutaneous Camelia Phenes Karmen Bongo, MD   5,000 Units at 11/13/19 [phone]  . ipratropium-albuterol (DUONEB) 0.5-2.5 (3) MG/3ML nebulizer solution 3 mL  3 mL Nebulization Q6H Karmen Bongo, MD   3 mL at 11/13/19 0912  . LORazepam (ATIVAN) injection 1 mg  1 mg Intravenous Once Karmen Bongo, MD      . metoprolol succinate (TOPROL-XL) 24 hr tablet 50 mg  50 mg Oral Daily Karmen Bongo, MD   50 mg at 11/12/19 1259  . mometasone-formoterol (DULERA) 200-5 MCG/ACT inhaler 2 puff  2 puff Inhalation BID Karmen Bongo, MD   2 puff at 11/13/19 0912  .  multivitamin (RENA-VIT) tablet 1 tablet  1 tablet Oral Ivery Quale, MD   1 tablet at 11/12/19 2106  . nitroGLYCERIN (NITROSTAT) SL tablet 0.4 mg  0.4 mg Sublingual Q5 min PRN Karmen Bongo, MD      . ondansetron [facility]) tablet 4 mg  4 mg Oral Q6H PRN Karmen Bongo, MD       Or  . ondansetron [facility]) injection 4 mg  4 mg Intravenous Q6H PRN Karmen Bongo, MD   4 mg at 11/11/19 2255  . QUEtiapine (SEROQUEL) tablet 200 mg  200 mg Oral QHS Karmen Bongo, MD   200 mg at 11/12/19 2106  . sodium chloride flush (NS) 0.9 % injection 3 mL  3 mL Intravenous Lillia Mountain, MD   3 mL at 11/12/19 2106  . sodium chloride flush (NS) 0.9 % injection 3 mL  3 mL Intravenous PRN Karmen Bongo, MD      . sorbitol 70 % solution 30 mL  30 mL Oral PRN Karmen Bongo, MD      . zolpidem Lorrin Mais) tablet 5 mg  5 mg Oral QHS PRN Karmen Bongo, MD         Discharge Medications: Please see discharge summary for a list of discharge medications.  Relevant Imaging Results:  Relevant Lab Results:   Additional Information SSN: [id number]     COVID negative 12/26  Alberteen Sam, [place]

## 2019-11-13 NOTE — Progress Notes (Signed)
Cardiology Office Note   Date:  11/16/2019   ID:  Kari Robinson, Kari Robinson [birth year], MRN [id number]  PCP:  Sonia Side., FNP Cardiologist:  Minus Breeding, MD 06/20/2019 in hospital Electrphysiologist: None Rosaria Ferries, PA-C   No chief complaint on file.   History of Present Illness: Kari Robinson is a 68 y.o. female with a history of CAD status post stents w/ non-obs dz at cath 11/01/2019, NSTEMI 09/2017, ICM & S-CHF (EF improved from 25% to 45-50%), DM, HTN, HLD, COPD on home oxygen 3 L/min, lung cancer s/p left lung resection 2017, ESRD on MWF HD, PAF,andtobacco abuse.  Admitted 12/26-12/28/2020 with shortness of breath and chest pain.  She initially required BiPAP.  Volume management per dialysis.  Her chest pain resolved with improvement in her respiratory status.  Kari Robinson presents for cardiology follow up.  Her breathing is at baseline. She has not missed any HD appts.   She was told she had extra fluid at HD yesterday, but says she is not drinking much at all.   She is in rehab at [facility], hopes to get out soon. She is still having problems with involuntary movements.   She has not had any chest pain since discharge from the hospital. The CP seems to be demand ischemia from volume overload.  She is aware that the last few episodes she has had have all been associated with the shortness of breath and that the shortness of breath came first.   Past Medical History:  Diagnosis Date  . AAA (abdominal aortic aneurysm) ([place]) 11/12/2019   4.1 cm on [place] 11/12/19 Recheck 1 year  . Alcohol abuse   . Arthralgia   . CAD (coronary artery disease) 2006, 2016   stents when in [place]  . Chronic combined systolic and diastolic HF (heart failure) ([place])   . COPD (chronic obstructive pulmonary disease) ([place])   . Depression   . Dyspnea   . ESRD (end stage renal disease) ([place])   . HTN (hypertension)   . Hypercholesterolemia    primarily ldl-p and small particles   . Lung cancer [facility])    Patient reports this with surgery.  No details.    [name] RAS (renal artery stenosis) ([place]) 2002   by cath tysinger  . Smoking     Past Surgical History:  Procedure Laterality Date  . abdominal aortogram     perclose of the right femoral artery  . AV FISTULA PLACEMENT Right 07/11/2018   Procedure: Right arm ARTERIOVENOUS FISTULA CREATION;  Surgeon: [name], MD;  Location: [facility];  Service: Vascular;  Laterality: Right;  . AVF placement Right   . CARDIAC CATHETERIZATION     left heart catheterization.  Coronary cineangiography. Lft ventricular cineangiography.    [name] LEFT HEART CATH AND CORONARY ANGIOGRAPHY N/A 11/01/2019   Procedure: LEFT HEART CATH AND CORONARY ANGIOGRAPHY;  Surgeon: Burnell Blanks, MD;  Location: [facility];  Service: Cardiovascular;  Laterality: N/A;  . LUNG SURGERY     due to lung cancer per patient's report  . TUBAL LIGATION      Current Outpatient Medications  Medication Sig Dispense Refill  . albuterol (PROVENTIL HFA;VENTOLIN HFA) 108 (90 Base) MCG/ACT inhaler Inhale 2 puffs into the lungs every 4 (four) hours as needed for wheezing or shortness of breath.     [name] aspirin EC 81 MG tablet Take 81 mg by mouth daily.    [name] atorvastatin (LIPITOR) 40 MG tablet  Take 40 mg by mouth daily.    Lorin Picket 1 GM 210 MG(Fe) tablet Take 420-630 mg by mouth See admin instructions. Take 3 tablets (630 mg) by mouth M-W-F at bedtime. Give 3 tablets three times daily with meals Tue-Thu-Sat-Sun.    [name] b complex-vitamin c-folic acid (NEPHRO-VITE) 0.8 MG TABS tablet Take 1 tablet by mouth See admin instructions. Take 1 tablet by mouth in the morning on Sun/Tues/Thurs/Sat and 1 tablet after dialysis on Mon/Wed/Fri    . bisacodyl (DULCOLAX) 10 MG suppository Place 10 mg rectally as needed for moderate constipation.    . budesonide-formoterol (SYMBICORT) 160-4.5 MCG/ACT inhaler Inhale 2 puffs into the lungs 2 (two) times daily.    [name] buPROPion  (WELLBUTRIN SR) 150 MG 12 hr tablet Take 150 mg by mouth 2 (two) times daily.    . clopidogrel (PLAVIX) 75 MG tablet Take 75 mg by mouth daily.    [name] lidocaine-prilocaine (EMLA) cream Apply 1 application topically every Monday, Wednesday, and Friday with hemodialysis. Prior to dialysis    . magnesium hydroxide (MILK OF MAGNESIA) 400 MG/5ML suspension Take 30 mLs by mouth daily as needed for mild constipation.    . metoprolol succinate (TOPROL-XL) 50 MG 24 hr tablet Take 50 mg by mouth daily. Take with or immediately following a meal.    . nitroGLYCERIN (NITROSTAT) 0.4 MG SL tablet Place 1 tablet (0.4 mg total) under the tongue every 5 (five) minutes as needed for chest pain.    . Nutritional Supplements (FEEDING SUPPLEMENT, NEPRO CARB STEADY,) LIQD Take 237 mLs by mouth 2 (two) times daily between meals.    . OXYGEN Inhale 3 L/min into the lungs continuous.     [name] QUEtiapine (SEROQUEL) 200 MG tablet Take 200 mg by mouth at bedtime.     . Sodium Phosphates (RA SALINE ENEMA) 19-7 GM/118ML ENEM Place 1 each rectally as needed (for constipation).     No current facility-administered medications for this visit.    Allergies:   Citalopram hydrobromide, Morphine and related, and Tape    Social History:  The patient  reports that she quit smoking about 6 months ago. Her smoking use included cigarettes. She quit after 35.00 years of use. She has never used smokeless tobacco. She reports previous alcohol use. She reports that she does not use drugs.   Family History:  The patient's family history includes Cancer in her brother; Emphysema in her father.  She indicated that her mother is deceased. She indicated that her father is deceased. She indicated that her brother is deceased.   ROS:  Please see the history of present illness. All other systems are reviewed and negative.    PHYSICAL EXAM: VS:  BP 127/82   Pulse 78   Ht 5\' 4"  (1.626 m)   Wt 159 lb 3.2 oz (72.2 kg)   BMI 27.33 kg/m  , BMI Body  mass index is 27.33 kg/m. GEN: Well nourished, well developed, female in no acute distress HEENT: normal for age  Neck: Minimal JVD, no carotid bruit, no masses Cardiac: RRR; soft murmur, no rubs, or gallops Respiratory: Decreased breath sounds bases to auscultation bilaterally, normal work of breathing, no wheezing or rhonchi noted GI: soft, nontender, nondistended, + BS MS: no deformity or atrophy; no edema; distal pulses are 2+ in all 4 extremities  Skin: warm and dry, no rash Neuro:  Strength and sensation are intact Psych: euthymic mood, full affect   EKG:  EKG is not ordered today.  ECHO: 06/17/2019  1. The left ventricle has mild-moderately reduced systolic function, with an ejection fraction of 40-45%. The cavity size was normal. There is mildly increased left ventricular wall thickness. Left ventricular diastolic Doppler parameters are consistent  with impaired relaxation. Elevated mean left atrial pressure.  2. The right ventricle has normal systolic function. The cavity was normal. There is no increase in right ventricular wall thickness.  3. Left atrial size was mildly dilated.  4. No evidence of mitral valve stenosis.  5. The aortic valve has an indeterminate number of cusps. Severely thickening of the aortic valve. Severe calcifcation of the aortic valve. No stenosis of the aortic valve.  6. The aorta is normal in size and structure.  7. The aortic root is normal in size and structure.  8. Pulmonary hypertension is not present, PASP is 27 mmHg.  CATH: 11/01/2019  Prox RCA to Mid RCA lesion is 30% stenosed.  Prox Cx to Mid Cx lesion is 20% stenosed.  Mid Cx lesion is 50% stenosed.  Ost Cx to Prox Cx lesion is 40% stenosed.  Ost LAD to Mid LAD lesion is 30% stenosed.  1st Sept lesion is 60% stenosed.  There is moderate left ventricular systolic dysfunction.  LV end diastolic pressure is moderately elevated.  The left ventricular ejection fraction is 35-45% by  visual estimate.  There is mild (2+) mitral regurgitation.   1. Diffuse, mild non-obstructive disease in the LAD  2. Patent stent in the mid Circumflex with minimal restenosis. Moderate stenosis just beyond the stented segment of the mid Circumflex artery. This does not appear to be flow limiting. 3. Mild non-obstructive disease in the mid RCA 4. Moderate, global LV systolic dysfunction. 5. Elevated filling pressures  Recommendations; Medical management of CAD. Explore other causes of chest pain.   Recent Labs: 11/01/2019: ALT 14; Magnesium 2.8; TSH 1.364 11/11/2019: B Natriuretic Peptide >4,500.0 11/12/2019: Hemoglobin 9.5; Platelets 240 11/13/2019: BUN 78; Creatinine, Ser 9.44; Potassium 4.6; Sodium 140  CBC    Component Value Date/Time   WBC 5.7 11/12/2019 0604   RBC 2.85 (L) 11/12/2019 0604   HGB 9.5 (L) 11/12/2019 0604   HCT 30.4 (L) 11/12/2019 0604   HCT 26.0 (L) 06/17/2019 1128   PLT 240 11/12/2019 0604   MCV 106.7 (H) 11/12/2019 0604   MCH 33.3 11/12/2019 0604   MCHC 31.3 11/12/2019 0604   RDW 15.6 (H) 11/12/2019 0604   LYMPHSABS 0.5 (L) 11/12/2019 0604   MONOABS 0.5 11/12/2019 0604   EOSABS 0.0 11/12/2019 0604   BASOSABS 0.0 11/12/2019 0604   CMP Latest Ref Rng & Units 11/13/2019 11/12/2019 11/11/2019  Glucose 70 - 99 mg/dL 88 95 121(H)  BUN 8 - 23 mg/dL 78(H) 43(H) 77(H)  Creatinine 0.44 - 1.00 mg/dL 9.44(H) 6.98(H) 10.29(H)  Sodium 135 - 145 mmol/L 140 141 139  Potassium 3.5 - 5.1 mmol/L 4.6 5.2(H) 4.7  Chloride 98 - 111 mmol/L 97(L) 99 99  CO2 22 - 32 mmol/L 25 28 25   Calcium 8.9 - 10.3 mg/dL 8.9 9.0 9.9  Total Protein 6.5 - 8.1 g/dL - - -  Total Bilirubin 0.3 - 1.2 mg/dL - - -  Alkaline Phos 38 - 126 U/L - - -  AST 15 - 41 U/L - - -  ALT 0 - 44 U/L - - -     Lipid Panel Lab Results  Component Value Date   CHOL 125 04/01/2019   HDL 51 04/01/2019   LDLCALC 59 04/01/2019   TRIG 76 04/01/2019  CHOLHDL 2.5 04/01/2019      Wt Readings from Last  3 Encounters:  11/16/19 159 lb 3.2 oz (72.2 kg)  11/14/19 158 lb 8 oz (71.9 kg)  11/12/19 158 lb 8 oz (71.9 kg)     Other studies Reviewed: Additional studies/ records that were reviewed today include: Office notes, hospital records and testing.  ASSESSMENT AND PLAN:  1.  Chest pain, history of CAD -Her last episodes of chest pain were in the setting of significant volume overload and respiratory failure -When she is not volume overloaded, she has no chest pain -Continue aspirin, Lipitor 40 mg daily, Plavix, Toprol-XL 50 mg a day  2.  COPD on home O2 -No wheezing at this time, her respiratory status is at baseline  3.  Chronic systolic CHF -Her EF had improved to 40-45% at last check -She is on a beta-blocker, but has not been on ACE/ARB/Entresto because of poor renal function -Now that she is on dialysis, could try adding low-dose losartan, but since blood pressure is not that high, and she would need to hold it on dialysis days anyway, will defer to MD   Current medicines are reviewed at length with the patient today.  The patient does not have concerns regarding medicines.  The following changes have been made:  no change  Labs/ tests ordered today include:  No orders of the defined types were placed in this encounter.    Disposition:   FU with Minus Breeding, MD  Signed, Rosaria Ferries, PA-C  11/16/2019 9:57 AM    [place] Phone: [phone]; Fax: [phone]

## 2019-11-13 NOTE — Discharge Summary (Signed)
Physician Discharge Summary  Lyliana Dicenso IOX:[id number] DOB: January 18, 1951 DOA: 11/11/2019  PCP: Tsosie Billing, MD (Inactive)  Admit date: 11/11/2019 Discharge date: 11/13/2019  Admitted From: snf Disposition:  snf  Recommendations for Outpatient Follow-up:  1. Follow up with PCP in 1-2 weeks 2. Please obtain BMP/CBC in one week 3. Please follow up on the following pending results:  Home Health:no  Equipment/Devices: none  Discharge Condition: Stable Code Status: full Diet recommendation: Renal,Diabetic  Diet.  Brief/Interim Summary:  68 y.o.femalewith medical history significant ofCAD status post stents, NSTEMI 09/2017, ischemic cardiomyopathy, chronic systolic CHF (EF improved from 25% to 45-50%), essential hypertension, hyperlipidemia, COPD on home oxygen 3 L/min, lung cancer s/p left lung resection 2017, ESRD on MWF HD, PAF,andtobacco abuse presenting with recurrent CP/SOB - had tx w/ BiPAP and urgent HD and feeling much better since. Patient was seen by nephrologist. At this time she is medically stable saturating well on home oxygen setting, no shortness of breath no leg swelling orthopnea.  She feels well and wants to return to [facility] re[facility].  And will get outpatient dialysis-[facility] social worker has been notified to set up outpatient dialysis today  Discharge Diagnoses:  Principal Problem:   Volume overload Active Problems:   Essential hypertension   ESRD on hemodialysis ([place])   DM (diabetes mellitus), type 2 with renal complications ([place])   Chronic systolic CHF (congestive heart failure) ([name])   COPD (chronic obstructive pulmonary disease) ([name])   HLD (hyperlipidemia)   Depression with anxiety   Chest pain with moderate risk for cardiac etiology, hx CAD   Prolonged QT interval   AAA (abdominal aortic aneurysm) ([name])  Volume overload with severe dyspnea in the setting of ESRD. - S/P urgent hemodialysis - sxs and problem resolved -  for dc today and will be going for dialysis outpatient after discharge  Chronic systolic CHF/CAD/chronic hypoxic respiratory failure on home oxygen. - Plavix, ASA  - metoprolol  Chest pain - resolved Continue her home cardiac medication AAA - [place] 10/2020\ Other medical problems includes: Hypertension, anxiety/depression, prolonged QTC, COPD, HLD: Continue home medication.  DVT prophylaxis: Heparin Code Status: Full Family Communication: none Disposition Plan: [facility] 11/13/19  Consults: Nephrology.  Subjective: Resting comfortably this morning no shortness of breath.  Feels well and wants to get discharged today.  Discharge Exam: Vitals:   11/13/19 0815 11/13/19 0913  BP: 140/70   Pulse: 76   Resp: 20   Temp: (!) 97.5 F (36.4 C)   SpO2: 100% 94%   General: Pt is alert, awake, not in acute distress Cardiovascular: RRR, S1/S2 +, no rubs, no gallops Respiratory: CTA bilaterally, no wheezing, no rhonchi Abdominal: Soft, NT, ND, bowel sounds + Extremities: no edema, no cyanosis AVF+ W/ W THRILL.  Dis AV fistula on the charge Instructions  Discharge Instructions    Diet Carb Modified   Complete by: As directed    Renal diet   Discharge instructions   Complete by: As directed    Please call call MD or return to ER for similar or worsening recurring problem that brought you to hospital or if any fever,nausea/vomiting,abdominal pain, uncontrolled pain, chest pain,  shortness of breath or any other alarming symptoms.  Please follow-up your doctor as instructed in a week time and call the office for appointment. Please do not miss your dialysis and continue as a scheduled starting today  Please avoid alcohol, smoking, or any other illicit substance and maintain healthy habits including taking your regular  medications as prescribed.  You were cared for by a hospitalist during your hospital stay. If you have any questions about your discharge medications or the care  you received while you were in the hospital after you are discharged, you can call the unit and ask to speak with the hospitalist on call if the hospitalist that took care of you is not available.  Once you are discharged, your primary care physician will handle any further medical issues. Please note that NO REFILLS for any discharge medications will be authorized once you are discharged, as it is imperative that you return to your primary care physician (or establish a relationship with a primary care physician if you do not have one) for your aftercare needs so that they can reassess your need for medications and monitor your lab values   Increase activity slowly   Complete by: As directed      Allergies as of 11/13/2019      Reactions   Citalopram Hydrobromide Hives   Morphine And Related Itching   Tape Other (See Comments)   PLASTIC, CLEAR TAPE STICKS TO THE SKIN AND POSSIBLY TEARS IT- PLEASE USE PAPER TAPE      Medication List    TAKE these medications   albuterol 108 (90 Base) MCG/ACT inhaler Commonly known as: VENTOLIN HFA Inhale 2 puffs into the lungs every 4 (four) hours as needed for wheezing or shortness of breath.   aspirin EC 81 MG tablet Take 81 mg by mouth daily.   atorvastatin 40 MG tablet Commonly known as: LIPITOR Take 40 mg by mouth daily.   Auryxia 1 GM 210 MG(Fe) tablet Generic drug: ferric citrate Take 420-630 mg by mouth See admin instructions. Take 3 tablets (630 mg) by mouth M-W-F at bedtime. Give 3 tablets three times daily with meals Tue-Thu-Sat-Sun.   b complex-vitamin c-folic acid 0.8 MG Tabs tablet Take 1 tablet by mouth See admin instructions. Take 1 tablet by mouth in the morning on Sun/Tues/Thurs/Sat and 1 tablet after dialysis on Mon/Wed/Fri   bisacodyl 10 MG suppository Commonly known as: DULCOLAX Place 10 mg rectally as needed for moderate constipation.   budesonide-formoterol 160-4.5 MCG/ACT inhaler Commonly known as: SYMBICORT Inhale 2  puffs into the lungs 2 (two) times daily.   buPROPion 150 MG 12 hr tablet Commonly known as: WELLBUTRIN SR Take 150 mg by mouth 2 (two) times daily.   clopidogrel 75 MG tablet Commonly known as: PLAVIX Take 75 mg by mouth daily.   feeding supplement (NEPRO CARB STEADY) Liqd Take 237 mLs by mouth 2 (two) times daily between meals.   ipratropium-albuterol 0.5-2.5 (3) MG/3ML Soln Commonly known as: DUONEB Take 3 mLs by nebulization every 6 (six) hours as needed. What changed: reasons to take this   lidocaine-prilocaine cream Commonly known as: EMLA Apply 1 application topically every Monday, Wednesday, and Friday with hemodialysis. Prior to dialysis   magnesium hydroxide 400 MG/5ML suspension Commonly known as: MILK OF MAGNESIA Take 30 mLs by mouth daily as needed for mild constipation.   metoprolol succinate 50 MG 24 hr tablet Commonly known as: TOPROL-XL Take 50 mg by mouth daily. Take with or immediately following a meal.   nitroGLYCERIN 0.4 MG SL tablet Commonly known as: NITROSTAT Place 1 tablet (0.4 mg total) under the tongue every 5 (five) minutes as needed for chest pain.   OXYGEN Inhale 3 L/min into the lungs continuous.   QUEtiapine 200 MG tablet Commonly known as: SEROQUEL Take 200 mg by mouth at  bedtime.   RA Saline Enema 19-7 GM/118ML Enem Place 1 each rectally as needed (for constipation).      Follow-up Information    Tsosie Billing, MD Follow up in 1 week(s).   Specialty: Internal Medicine Contact information: [place] [address] [phone]        Minus Breeding, MD .   Specialty: Cardiology Contact information: [address] [place] [address] [phone]          Allergies  Allergen Reactions  . Citalopram Hydrobromide Hives  . Morphine And Related Itching  . Tape Other (See Comments)    PLASTIC, CLEAR TAPE STICKS TO THE SKIN AND POSSIBLY TEARS IT- PLEASE USE PAPER TAPE    The  results of significant diagnostics from this hospitalization (including imaging, microbiology, ancillary and laboratory) are listed below for reference.    Microbiology: Recent Results (from the past 240 hour(s))  SARS CORONAVIRUS 2 (TAT 6-24 HRS) Nasopharyngeal Nasopharyngeal Swab     Status: None   Collection Time: 11/05/19 10:46 AM   Specimen: Nasopharyngeal Swab  Result Value Ref Range Status   SARS Coronavirus 2 NEGATIVE NEGATIVE Final    Comment: (NOTE) SARS-CoV-2 target nucleic acids are NOT DETECTED. The SARS-CoV-2 RNA is generally detectable in upper and lower respiratory specimens during the acute phase of infection. Negative results do not preclude SARS-CoV-2 infection, do not rule out co-infections with other pathogens, and should not be used as the sole basis for treatment or other patient management decisions. Negative results must be combined with clinical observations, patient history, and epidemiological information. The expected result is Negative. Fact Sheet for Patients: SugarRoll.be Fact Sheet for Healthcare Providers: https://www.woods-mathews.com/ This test is not yet approved or cleared by the [place] FDA and  has been authorized for detection and/or diagnosis of SARS-CoV-2 by FDA under an Emergency Use Authorization (EUA). This EUA will remain  in effect (meaning this test can be used) for the duration of the COVID-19 declaration under Section 56 4(b)(1) of the Act, 21 U.S.C. section 360bbb-3(b)(1), unless the authorization is terminated or revoked sooner. Performed at [facility], [place] [address] [place], [place] [address]   Respiratory Panel by RT PCR (Flu A&B, Covid) - Nasopharyngeal Swab     Status: None   Collection Time: 11/11/19  1:41 PM   Specimen: Nasopharyngeal Swab  Result Value Ref Range Status   SARS Coronavirus 2 by RT PCR NEGATIVE NEGATIVE Final    Comment: (NOTE) SARS-CoV-2 target  nucleic acids are NOT DETECTED. The SARS-CoV-2 RNA is generally detectable in upper respiratoy specimens during the acute phase of infection. The lowest concentration of SARS-CoV-2 viral copies this assay can detect is 131 copies/mL. A negative result does not preclude SARS-Cov-2 infection and should not be used as the sole basis for treatment or other patient management decisions. A negative result may occur with  improper specimen collection/handling, submission of specimen other than nasopharyngeal swab, presence of viral mutation(s) within the areas targeted by this assay, and inadequate number of viral copies (<131 copies/mL). A negative result must be combined with clinical observations, patient history, and epidemiological information. The expected result is Negative. Fact Sheet for Patients:  PinkCheek.be Fact Sheet for Healthcare Providers:  GravelBags.it This test is not yet ap proved or cleared by the [place] FDA and  has been authorized for detection and/or diagnosis of SARS-CoV-2 by FDA under an Emergency Use Authorization (EUA). This EUA will remain  in effect (meaning this  test can be used) for the duration of the COVID-19 declaration under Section 564(b)(1) of the Act, 21 U.S.C. section 360bbb-3(b)(1), unless the authorization is terminated or revoked sooner.    Influenza A by PCR NEGATIVE NEGATIVE Final   Influenza B by PCR NEGATIVE NEGATIVE Final    Comment: (NOTE) The Xpert Xpress SARS-CoV-2/FLU/RSV assay is intended as an aid in  the diagnosis of influenza from Nasopharyngeal swab specimens and  should not be used as a sole basis for treatment. Nasal washings and  aspirates are unacceptable for Xpert Xpress SARS-CoV-2/FLU/RSV  testing. Fact Sheet for Patients: PinkCheek.be Fact Sheet for Healthcare Providers: GravelBags.it This test is not  yet approved or cleared by the [place] FDA and  has been authorized for detection and/or diagnosis of SARS-CoV-2 by  FDA under an Emergency Use Authorization (EUA). This EUA will remain  in effect (meaning this test can be used) for the duration of the  Covid-19 declaration under Section 564(b)(1) of the Act, 21  U.S.C. section 360bbb-3(b)(1), unless the authorization is  terminated or revoked. Performed at [facility], [place] [address], [place], [place] [address]     Procedures/Studies: DG Chest 2 View  Result Date: 11/11/2019 CLINICAL DATA:  68 year old female with central chest pain and increased shortness of breath this morning. History of COPD, lung cancer. Multiple negative tests for COVID-19, most recently 11/05/2019. EXAM: CHEST - 2 VIEW COMPARISON:  Portable chest 11/01/2019 and earlier. FINDINGS: PA and lateral views of the chest. Chronic large lung volumes with emphysema and postoperative changes in the left lung. Stable tortuous aorta with calcified atherosclerosis. Stable cardiac size at the upper limits of normal. Substantial regression but not complete resolution of the multifocal right lung reticulonodular opacity on 11/01/2019. Residual mostly at the lung base. No pneumothorax or pulmonary edema. No pleural effusion. No areas of worsening ventilation. No acute osseous abnormality identified. Possible aneurysmal enlargement of the abdominal aorta, lateral view roughly 4 centimeters diameter. Negative visible bowel gas pattern. IMPRESSION: 1. Chronic lung disease with significantly improved but not completely resolved right lung pneumonia since 11/01/2019. 2. No new cardiopulmonary abnormality. 3. Extensive Aortic Atherosclerosis (ICD10-I70.0) with questionable fusiform aneurysmal enlargement of the abdominal aorta. Follow-up Aorta Ultrasound would be the simplest way to confirm or exclude AAA. Electronically Signed   By: Genevie Ann M.D.   On: 11/11/2019 08:47   MR Brain Wo  Contrast (neuro protocol)  Result Date: 10/18/2019 CLINICAL DATA:  Focal neuro deficit, greater than 6 hours, stroke suspected. New aphasia since yesterday. Additional provided: Tremors and difficulty speaking which generalized weakness. EXAM: MRI HEAD WITHOUT CONTRAST TECHNIQUE: Multiplanar, multiecho pulse sequences of the brain and surrounding structures were obtained without intravenous contrast. COMPARISON:  Report from brain MRI 04/01/2019 (images currently unavailable), brain MRI 02/25/2019, head CT 02/24/2019 FINDINGS: Brain: Punctate focus of subtle apparent restricted diffusion within the right frontal lobe white matter (seen only on axial diffusion-weighted imaging)(series 2, image 38). This may reflect image noise or a tiny early subacute infarct. No evidence of recent infarct elsewhere within the brain. No evidence of intracranial mass. No midline shift or extra-axial fluid collection. Again demonstrated is a large region of encephalomalacia and associated chronic blood products within the right frontal lobe and right basal ganglia, reflecting a chronic hemorrhagic infarct. Redemonstrated chronic lacunar infarct within the left corona radiata. Background of moderate chronic small vessel ischemic disease. Cerebral volume is normal for age. Vascular: Lack of expected flow void within the intracranial left vertebral artery,  unchanged from prior exams and likely reflecting chronic occlusion. Flow voids otherwise preserved within the proximal large arterial vessels. Skull and upper cervical spine: No focal marrow lesion Sinuses/Orbits: Visualized orbits demonstrate no acute abnormality. Minimal ethmoid sinus mucosal thickening. Trace fluid within right mastoid air cells. IMPRESSION: 1. Image noise artifact versus punctate early subacute infarct within the right frontal lobe white matter. 2. 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 3. Moderate  chronic small vessel ischemic disease. 4. 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 Electronically Signed   By: Kellie Simmering DO   On: 10/18/2019 11:48   CARDIAC CATHETERIZATION  Result Date: 11/01/2019  Prox RCA to Mid RCA lesion is 30% stenosed.  Prox Cx to Mid Cx lesion is 20% stenosed.  Mid Cx lesion is 50% stenosed.  Ost Cx to Prox Cx lesion is 40% stenosed.  Ost LAD to Mid LAD lesion is 30% stenosed.  1st Sept lesion is 60% stenosed.  There is moderate left ventricular systolic dysfunction.  LV end diastolic pressure is moderately elevated.  The left ventricular ejection fraction is 35-45% by visual estimate.  There is mild (2+) mitral regurgitation.  1. Diffuse, mild non-obstructive disease in the LAD 2. Patent stent in the mid Circumflex with minimal restenosis. Moderate stenosis just beyond the stented segment of the mid Circumflex artery. This does not appear to be flow limiting. 3. Mild non-obstructive disease in the mid RCA 4. Moderate, global LV systolic dysfunction. 5. Elevated filling pressures Recommendations; Medical management of CAD. Explore other causes of chest pain.   US AORTA  Result Date: 11/12/2019 CLINICAL DATA:  AAA seen on chest imaging EXAM: ULTRASOUND OF ABDOMINAL AORTA TECHNIQUE: Ultrasound examination of the abdominal aorta and proximal common iliac arteries was performed to evaluate for aneurysm. Additional color and Doppler images of the distal aorta were obtained to document patency. 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 Recommend followup by ultrasound in 1 year. This recommendation follows ACR consensus guidelines: White  Paper of the ACR Incidental Findings Committee II on Vascular Findings. J Am Coll Radiol 2013; 10:789-794. Aortic aneurysm NOS (ICD10-I71.9) Electronically Signed   By: Lovena Le M.D.   On: 11/12/2019 02:02   DG Chest Port 1 View  Result Date: 11/01/2019 CLINICAL DATA:  Shortness of breath EXAM: PORTABLE CHEST 1 VIEW COMPARISON:  10/31/2019 FINDINGS: Cardiomegaly. There is hyperinflation of the lungs compatible with COPD. Aortic atherosclerosis. Airspace opacities bilaterally, most confluent in the right mid and lower lung. Appearance is concerning for pneumonia. Diffuse interstitial prominence could also be related to infection or interstitial edema. IMPRESSION: Cardiomegaly, COPD.  Possible interstitial edema. Confluent airspace disease in the right mid and lower lung concerning for pneumonia. Electronically Signed   By: Rolm Baptise M.D.   On: 11/01/2019 17:41   DG Chest Portable 1 View  Result Date: 10/31/2019 CLINICAL DATA:  Chest pain and shortness of breath EXAM: PORTABLE CHEST 1 VIEW COMPARISON:  October 19, 2019 FINDINGS: There is slight scarring in the bases. There is no edema or consolidation. Heart is upper normal in size with pulmonary vascularity normal. No adenopathy. There is aortic atherosclerosis. There are foci of  calcification in each subclavian and axillary artery. No bone lesions. IMPRESSION: Mild bibasilar scarring. No edema or consolidation. Stable cardiac silhouette. No adenopathy. There is extensive arterial vascular calcification. Aortic Atherosclerosis (ICD10-I70.0). Electronically Signed   By: Lowella Grip III M.D.   On: 10/31/2019 15:59   DG Chest Portable 1 View  Result Date: 10/19/2019 CLINICAL DATA:  Shortness of breath EXAM: PORTABLE CHEST 1 VIEW COMPARISON:  08/07/2019 FINDINGS: There is hyperinflation of the lungs compatible with COPD. Cardiomegaly. Aortic atherosclerosis. No confluent airspace opacities, effusions or edema. No acute bony abnormality.  IMPRESSION: Cardiomegaly, COPD.  No acute findings. Electronically Signed   By: Rolm Baptise M.D.   On: 10/19/2019 02:55    Labs: BNP (last 3 results) Recent Labs    08/06/19 1307 10/31/19 1514 11/11/19 1218  BNP >4,500.0* 3,389.6* >2,979.8*   Basic Metabolic Panel: Recent Labs  Lab 11/11/19 0807 11/12/19 0604 11/13/19 0454  NA 139 141 140  K 4.7 5.2* 4.6  CL 99 99 97*  CO2 25 28 25   GLUCOSE 121* 95 88  BUN 77* 43* 78*  CREATININE 10.29* 6.98* 9.44*  CALCIUM 9.9 9.0 8.9   Liver Function Tests: No results for input(s): AST, ALT, ALKPHOS, BILITOT, PROT, ALBUMIN in the last 168 hours. No results for input(s): LIPASE, AMYLASE in the last 168 hours. No results for input(s): AMMONIA in the last 168 hours. CBC: Recent Labs  Lab 11/11/19 0807 11/12/19 0604  WBC 9.3 5.7  NEUTROABS  --  4.6  HGB 10.1* 9.5*  HCT 32.7* 30.4*  MCV 109.4* 106.7*  PLT 258 240   Cardiac Enzymes: No results for input(s): CKTOTAL, CKMB, CKMBINDEX, TROPONINI in the last 168 hours. BNP: Invalid input(s): POCBNP CBG: Recent Labs  Lab 11/06/19 1201 11/11/19 2256  GLUCAP 131* 130*   D-Dimer No results for input(s): DDIMER in the last 72 hours. Hgb A1c No results for input(s): HGBA1C in the last 72 hours. Lipid Profile No results for input(s): CHOL, HDL, LDLCALC, TRIG, CHOLHDL, LDLDIRECT in the last 72 hours. Thyroid function studies No results for input(s): TSH, T4TOTAL, T3FREE, THYROIDAB in the last 72 hours.  Invalid input(s): FREET3 Anemia work up No results for input(s): VITAMINB12, FOLATE, FERRITIN, TIBC, IRON, RETICCTPCT in the last 72 hours. Urinalysis    Component Value Date/Time   COLORURINE STRAW (A) 02/25/2019 0735   APPEARANCEUR CLEAR 02/25/2019 0735   LABSPEC 1.004 (L) 02/25/2019 0735   PHURINE 9.0 (H) 02/25/2019 0735   GLUCOSEU NEGATIVE 02/25/2019 0735   HGBUR SMALL (A) 02/25/2019 0735   BILIRUBINUR NEGATIVE 02/25/2019 0735   KETONESUR NEGATIVE 02/25/2019 0735    PROTEINUR 100 (A) 02/25/2019 0735   NITRITE NEGATIVE 02/25/2019 0735   LEUKOCYTESUR NEGATIVE 02/25/2019 0735   Sepsis Labs Invalid input(s): PROCALCITONIN,  WBC,  LACTICIDVEN Microbiology Recent Results (from the past 240 hour(s))  SARS CORONAVIRUS 2 (TAT 6-24 HRS) Nasopharyngeal Nasopharyngeal Swab     Status: None   Collection Time: 11/05/19 10:46 AM   Specimen: Nasopharyngeal Swab  Result Value Ref Range Status   SARS Coronavirus 2 NEGATIVE NEGATIVE Final    Comment: (NOTE) SARS-CoV-2 target nucleic acids are NOT DETECTED. The SARS-CoV-2 RNA is generally detectable in upper and lower respiratory specimens during the acute phase of infection. Negative results do not preclude SARS-CoV-2 infection, do not rule out co-infections with other pathogens, and should not be used as the sole basis for treatment or other patient management decisions. Negative results must be combined with clinical observations, patient history, and epidemiological  information. The expected result is Negative. Fact Sheet for Patients: SugarRoll.be Fact Sheet for Healthcare Providers: https://www.woods-mathews.com/ This test is not yet approved or cleared by the [place] FDA and  has been authorized for detection and/or diagnosis of SARS-CoV-2 by FDA under an Emergency Use Authorization (EUA). This EUA will remain  in effect (meaning this test can be used) for the duration of the COVID-19 declaration under Section 56 4(b)(1) of the Act, 21 U.S.C. section 360bbb-3(b)(1), unless the authorization is terminated or revoked sooner. Performed at [facility], [place] [address] [place], [place] [address]   Respiratory Panel by RT PCR (Flu A&B, Covid) - Nasopharyngeal Swab     Status: None   Collection Time: 11/11/19  1:41 PM   Specimen: Nasopharyngeal Swab  Result Value Ref Range Status   SARS Coronavirus 2 by RT PCR NEGATIVE NEGATIVE Final    Comment:  (NOTE) SARS-CoV-2 target nucleic acids are NOT DETECTED. The SARS-CoV-2 RNA is generally detectable in upper respiratoy specimens during the acute phase of infection. The lowest concentration of SARS-CoV-2 viral copies this assay can detect is 131 copies/mL. A negative result does not preclude SARS-Cov-2 infection and should not be used as the sole basis for treatment or other patient management decisions. A negative result may occur with  improper specimen collection/handling, submission of specimen other than nasopharyngeal swab, presence of viral mutation(s) within the areas targeted by this assay, and inadequate number of viral copies (<131 copies/mL). A negative result must be combined with clinical observations, patient history, and epidemiological information. The expected result is Negative. Fact Sheet for Patients:  PinkCheek.be Fact Sheet for Healthcare Providers:  GravelBags.it This test is not yet ap proved or cleared by the [place] FDA and  has been authorized for detection and/or diagnosis of SARS-CoV-2 by FDA under an Emergency Use Authorization (EUA). This EUA will remain  in effect (meaning this test can be used) for the duration of the COVID-19 declaration under Section 564(b)(1) of the Act, 21 U.S.C. section 360bbb-3(b)(1), unless the authorization is terminated or revoked sooner.    Influenza A by PCR NEGATIVE NEGATIVE Final   Influenza B by PCR NEGATIVE NEGATIVE Final    Comment: (NOTE) The Xpert Xpress SARS-CoV-2/FLU/RSV assay is intended as an aid in  the diagnosis of influenza from Nasopharyngeal swab specimens and  should not be used as a sole basis for treatment. Nasal washings and  aspirates are unacceptable for Xpert Xpress SARS-CoV-2/FLU/RSV  testing. Fact Sheet for Patients: PinkCheek.be Fact Sheet for Healthcare  Providers: GravelBags.it This test is not yet approved or cleared by the [place] FDA and  has been authorized for detection and/or diagnosis of SARS-CoV-2 by  FDA under an Emergency Use Authorization (EUA). This EUA will remain  in effect (meaning this test can be used) for the duration of the  Covid-19 declaration under Section 564(b)(1) of the Act, 21  U.S.C. section 360bbb-3(b)(1), unless the authorization is  terminated or revoked. Performed at [facility], [place] [address], [place], [place] [address]      Time coordinating discharge:  35  minutes  SIGNED: Antonieta Pert, MD  [facility] 11/13/2019, 10:31 AM  If 7PM-7AM, please contact night-coverage [email or web address]

## 2019-11-13 NOTE — Progress Notes (Addendum)
Renal Navigator notified by Attending/Dr. Lupita Leash that patient will be discharged back to [facility] today. Today is patient's normal HD day. Renal Navigator contacted CSW/A. [place], OP HD clinic/South, and [facility] Admissions Coordinator/Tanya to discuss discharge plan.  Patient will be picked up by [facility] to be taken to OP HD clinic[facility] for OP HD today and will not have HD in the inpt unit in order to avoid day of discharge HD to keep hospital unit clear for inpatients. Patient's typical seat time is 12:20pm and she needs to arrive at 12:00pm. Bedside RN aware. Navigator spoke with patient, who is aware and agreeable. Sweatshirt, sweatpants and a blanket provided for her comfort and because she does not have clothes with her. Tanya at [facility] confirmed that their transportation can pick patient up from HD after her treatment today and take her to [place]. Navigator asked for pick up between 4:30-5:00pm.  Renal PA/Grace will send orders.  Alphonzo Cruise, [place] Renal Navigator [phone]

## 2019-11-14 ENCOUNTER — Non-Acute Institutional Stay (SKILLED_NURSING_FACILITY): Payer: Medicare Other | Admitting: Internal Medicine

## 2019-11-14 ENCOUNTER — Encounter: Payer: Self-pay | Admitting: Internal Medicine

## 2019-11-14 DIAGNOSIS — I5022 Chronic systolic (congestive) heart failure: Secondary | ICD-10-CM

## 2019-11-14 DIAGNOSIS — I200 Unstable angina: Secondary | ICD-10-CM | POA: Diagnosis not present

## 2019-11-14 DIAGNOSIS — N186 End stage renal disease: Secondary | ICD-10-CM

## 2019-11-14 DIAGNOSIS — Z992 Dependence on renal dialysis: Secondary | ICD-10-CM

## 2019-11-14 NOTE — Progress Notes (Signed)
NURSING HOME LOCATION:  [facility] ROOM NUMBER:  [id number]  CODE STATUS:  FULL CODE  PCP:  Tsosie Billing, MD (Inactive)  [place] [address]  This is a [place] readmission within 30 days.   Interim medical record and care since last [place] visit was updated with review of diagnostic studies and change in clinical status since last visit were documented.  HPI: The patient was rehospitalized 12/26-12/28/2020 with clinical volume overload for which she received urgent hemodialysis and BiPAP with resolutions of associated chest pain and dyspnea.  Ejection fraction improved from 25% to 45-50%.  This is in the context of chronic systolic congestive heart failure/ischemic cardiomyopathy/ & chronic hypoxic respiratory failure with maintenance home oxygen. Te presentation was in the context of significant CAD with non-STEMI for which she has had stents.  Other medical diagnoses include essential hypertension, renal artery stenosis, depression, alcohol abuse, dyslipidemia, COPD, HD dependent ESRD, and history of lung cancer.  Review of systems: She denies any active signs of cardiac decompensation at this time.  She does describe a chronic cough which is intermittently productive.  She states her anxiety and depression are chronic issue with suboptimal response to Wellbutrin.  She maintains that she is an ex-smoker and plans to remain so.  Constitutional: No fever, significant weight change, fatigue  Eyes: No redness, discharge, pain, vision change ENT/mouth: No nasal congestion,  purulent discharge, earache, change in hearing, sore throat  Cardiovascular: No chest pain, palpitations, paroxysmal nocturnal dyspnea, claudication, edema  Respiratory: No hemoptysis,  significant snoring, apnea   Gastrointestinal: No heartburn, dysphagia, abdominal pain, nausea /vomiting, rectal bleeding, melena, change in bowels Genitourinary: No dysuria, hematuria,  pyuria, incontinence, nocturia.  She states that she does have oliguria despite the ESRD. Musculoskeletal: No joint stiffness, joint swelling, weakness, pain Dermatologic: No rash, pruritus, change in appearance of skin Neurologic: No dizziness, headache, syncope, seizures, numbness, tingling Psychiatric: No  insomnia, anorexia Endocrine: No change in hair/skin/nails, excessive thirst, excessive hunger, excessive urination  Hematologic/lymphatic: No significant bruising, lymphadenopathy, abnormal bleeding Allergy/immunology: No itchy/watery eyes, significant sneezing, urticaria, angioedema  Physical exam:  Pertinent or positive findings: She is resting comfortably in bed at 45 degrees. A tear shaped tattoo is present under the right eye.  She is edentulous.  She is wearing nasal oxygen.  Chest is barrel-shaped and breath sounds are markedly diminished.  Heart sounds are also distant.  Abdomen is protuberant.  Posterior tibial pulses are strong compared to the dorsalis pedis pulses. Varicosities are present over the right forearm.  She has a tattoo at the right dorsal wrist.  Clubbing the nailbeds is present.  General appearance: Adequately nourished; no acute distress, increased work of breathing is present.   Lymphatic: No lymphadenopathy about the head, neck, axilla. Eyes: No conjunctival inflammation or lid edema is present. There is no scleral icterus. Ears:  External ear exam shows no significant lesions or deformities.   Nose:  External nasal examination shows no deformity or inflammation. Nasal mucosa are pink and moist without lesions, exudates Oral exam:  Lips and gums are healthy appearing. There is no oropharyngeal erythema or exudate. Neck:  No thyromegaly, masses, tenderness noted.    Heart:  No gallop, murmur, click, rub .  Lungs: without wheezes, rhonchi, rales, rubs. Abdomen: Bowel sounds are normal. Abdomen is soft and nontender with no organomegaly, hernias, masses. GU:  Deferred  Extremities:  No cyanosis, edema  Neurologic exam : Balance, Rhomberg, finger to nose testing could  not be completed due to clinical state Skin: Warm & dry w/o tenting. No significant lesions or rash.  See summary under each active problem in the Problem List with associated updated therapeutic plan

## 2019-11-14 NOTE — Assessment & Plan Note (Addendum)
Fluid restriction and compliance with HD essential to prevent volume overload in the context of ischemic cardiomyopathy with severely decreased EF.

## 2019-11-14 NOTE — Patient Instructions (Signed)
See assessment and plan under each diagnosis in the problem list and acutely for this visit 

## 2019-11-14 NOTE — Assessment & Plan Note (Addendum)
Cardiology follow-up appointment 11/16/2019. The pathophysiology of ischemic cardiomyopathy with reduced EF was discussed with her.  Need for fluid restriction, sodium restriction & avoidance of hypoxia explained. HD will continue 3 times weekly; M,W, & F.

## 2019-11-15 NOTE — Assessment & Plan Note (Signed)
Denies anginal equivalent 12/29

## 2019-11-16 ENCOUNTER — Encounter: Payer: Self-pay | Admitting: Physician Assistant

## 2019-11-16 ENCOUNTER — Encounter: Payer: Self-pay | Admitting: Internal Medicine

## 2019-11-16 ENCOUNTER — Other Ambulatory Visit: Payer: Self-pay

## 2019-11-16 ENCOUNTER — Ambulatory Visit (INDEPENDENT_AMBULATORY_CARE_PROVIDER_SITE_OTHER): Payer: Medicare Other | Admitting: Physician Assistant

## 2019-11-16 VITALS — BP 127/82 | HR 78 | Ht 64.0 in | Wt 159.2 lb

## 2019-11-16 DIAGNOSIS — I25118 Atherosclerotic heart disease of native coronary artery with other forms of angina pectoris: Secondary | ICD-10-CM | POA: Diagnosis not present

## 2019-11-16 DIAGNOSIS — R4189 Other symptoms and signs involving cognitive functions and awareness: Secondary | ICD-10-CM | POA: Insufficient documentation

## 2019-11-16 DIAGNOSIS — J449 Chronic obstructive pulmonary disease, unspecified: Secondary | ICD-10-CM | POA: Diagnosis not present

## 2019-11-16 DIAGNOSIS — R29818 Other symptoms and signs involving the nervous system: Secondary | ICD-10-CM | POA: Insufficient documentation

## 2019-11-16 DIAGNOSIS — I5022 Chronic systolic (congestive) heart failure: Secondary | ICD-10-CM

## 2019-11-16 MED ORDER — IPRATROPIUM-ALBUTEROL 0.5-2.5 (3) MG/3ML IN SOLN: 3.0000 mL | Freq: Four times a day (QID) | RESPIRATORY_TRACT | Status: DC | PRN

## 2019-11-16 NOTE — Patient Instructions (Signed)
Follow-Up: IN 6 months Please call our office 2 months in advance, APRIL 2021 to schedule this June 2021 appointment. In Person Minus Breeding, MD.    At [facility], you and your health needs are our priority.  As part of our continuing mission to provide you with exceptional heart care, we have created designated Provider Care Teams.  These Care Teams include your primary Cardiologist (physician) and Advanced Practice Providers (APPs -  Physician Assistants and Nurse Practitioners) who all work together to provide you with the care you need, when you need it.  Thank you for choosing [facility] at [facility]!!

## 2019-11-20 ENCOUNTER — Encounter: Payer: Self-pay | Admitting: Adult Health

## 2019-11-20 ENCOUNTER — Non-Acute Institutional Stay (SKILLED_NURSING_FACILITY): Payer: Medicare Other | Admitting: Adult Health

## 2019-11-20 DIAGNOSIS — I25110 Atherosclerotic heart disease of native coronary artery with unstable angina pectoris: Secondary | ICD-10-CM | POA: Diagnosis not present

## 2019-11-20 DIAGNOSIS — Z992 Dependence on renal dialysis: Secondary | ICD-10-CM

## 2019-11-20 DIAGNOSIS — N186 End stage renal disease: Secondary | ICD-10-CM

## 2019-11-20 DIAGNOSIS — R4189 Other symptoms and signs involving cognitive functions and awareness: Secondary | ICD-10-CM

## 2019-11-20 DIAGNOSIS — D638 Anemia in other chronic diseases classified elsewhere: Secondary | ICD-10-CM

## 2019-11-20 DIAGNOSIS — R29818 Other symptoms and signs involving the nervous system: Secondary | ICD-10-CM

## 2019-11-20 DIAGNOSIS — I5022 Chronic systolic (congestive) heart failure: Secondary | ICD-10-CM

## 2019-11-20 DIAGNOSIS — F418 Other specified anxiety disorders: Secondary | ICD-10-CM

## 2019-11-20 DIAGNOSIS — J449 Chronic obstructive pulmonary disease, unspecified: Secondary | ICD-10-CM

## 2019-11-20 IMAGING — CT CT CERVICAL SPINE WITHOUT CONTRAST
5 of 8 series · 11 of 33 positions shown, 12 images · non-contrast
Comparison: None.

CLINICAL DATA: Seizure.

EXAM:
CT HEAD WITHOUT CONTRAST
CT CERVICAL SPINE WITHOUT CONTRAST
TECHNIQUE: Multidetector CT imaging of the head and cervical spine was
performed following the standard protocol without intravenous
contrast. Multiplanar CT image reconstructions of the cervical spine
were also generated.

[Series 5: head bone · axial · 0.38mm/px · z∈[-116,-64]mm · 2 of 79 slices shown]
[im 27/79  bone]
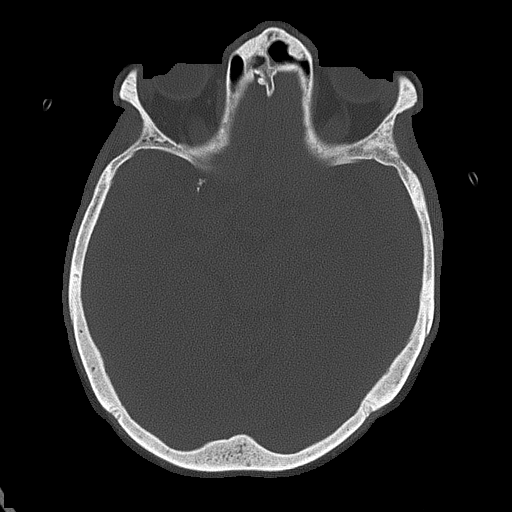
[im 53/79  bone]
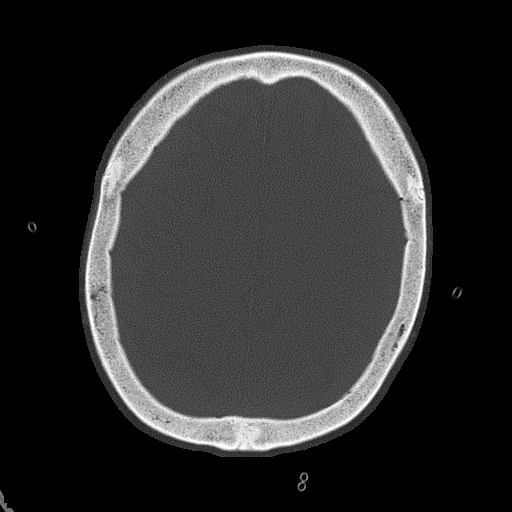

[Series 8: c_spine 2.0 st · axial · 0.23mm/px · z∈[-264,-200]mm · 2 of 96 slices shown, 3 images]
[im 32/96  soft-tissue]
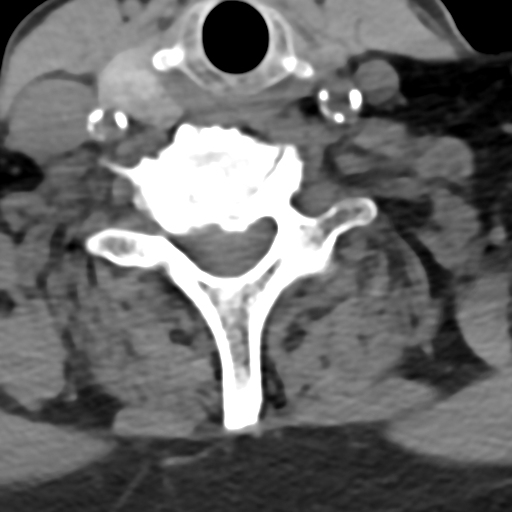
[im 32/96  bone]
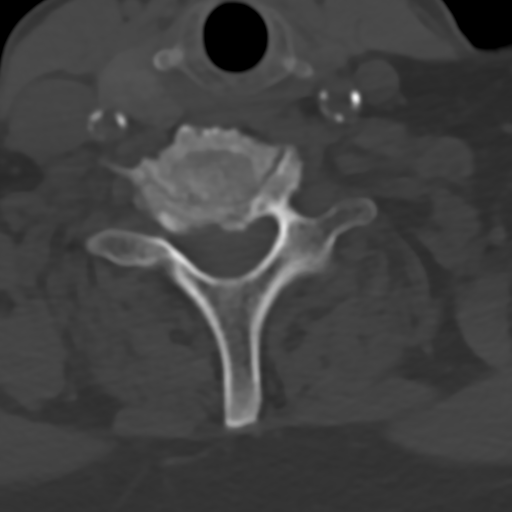
[im 64/96  bone]
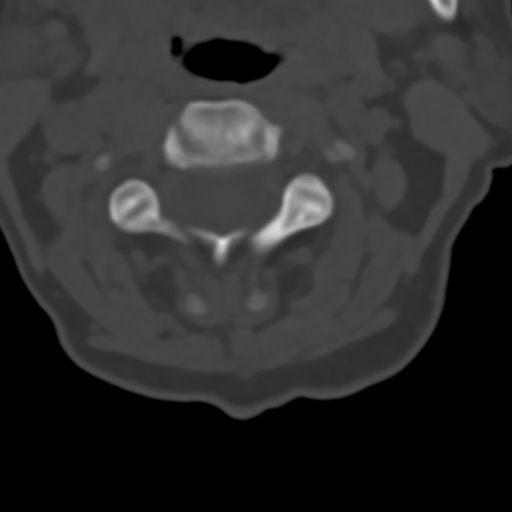

[Series 10: c_spine 2.0 sag bone · sagittal · 0.23mm/px · 4 of 53 slices shown]
[im 11/53  bone]
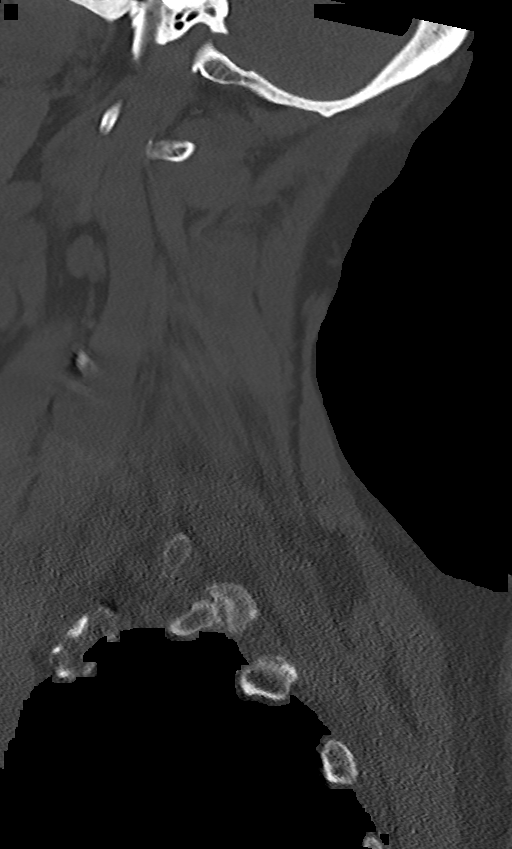
[im 21/53  bone]
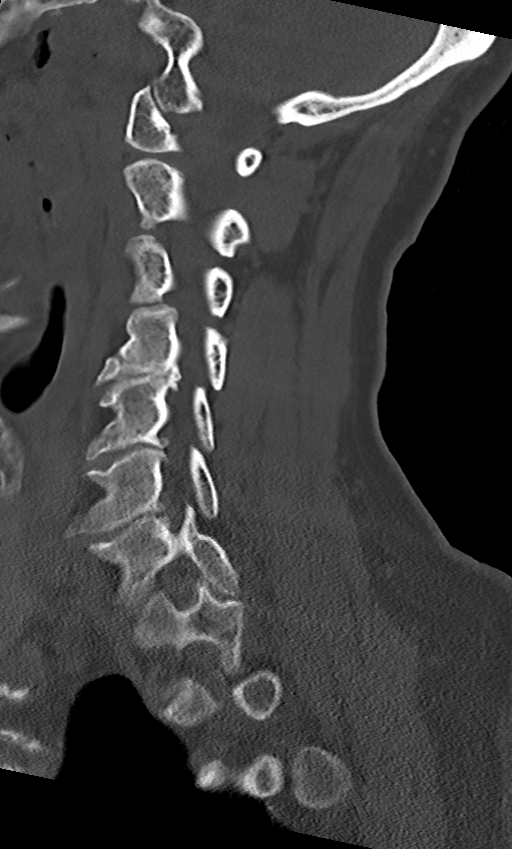
[im 32/53  bone]
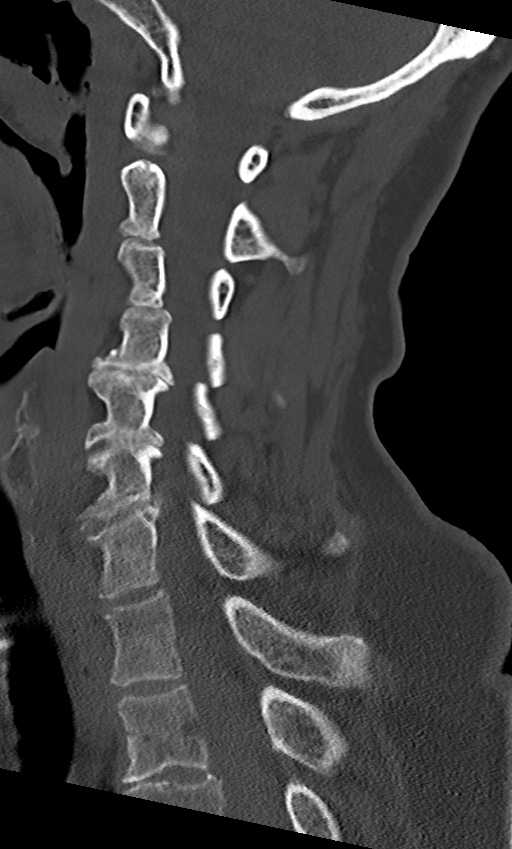
[im 42/53  bone]
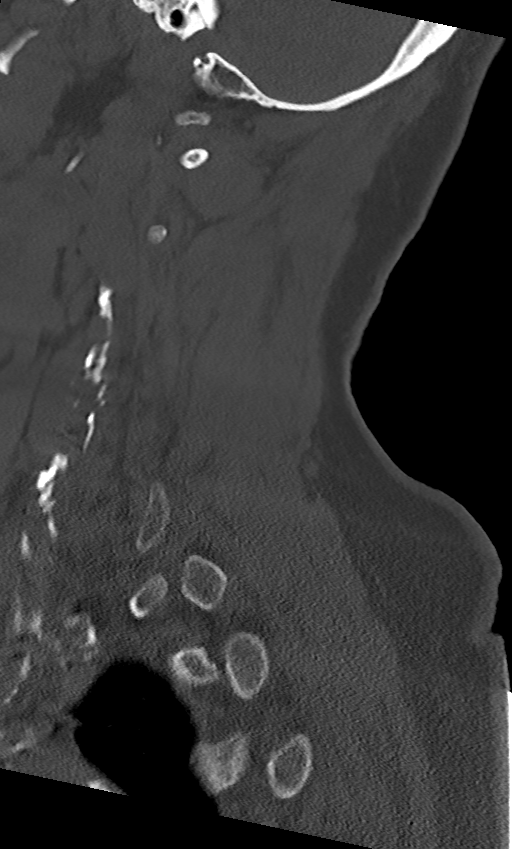

[Series 11: c_spine 2.0 cor bone · coronal · 0.20mm/px · 1 of 61 slices shown]
[im 31/61  bone]
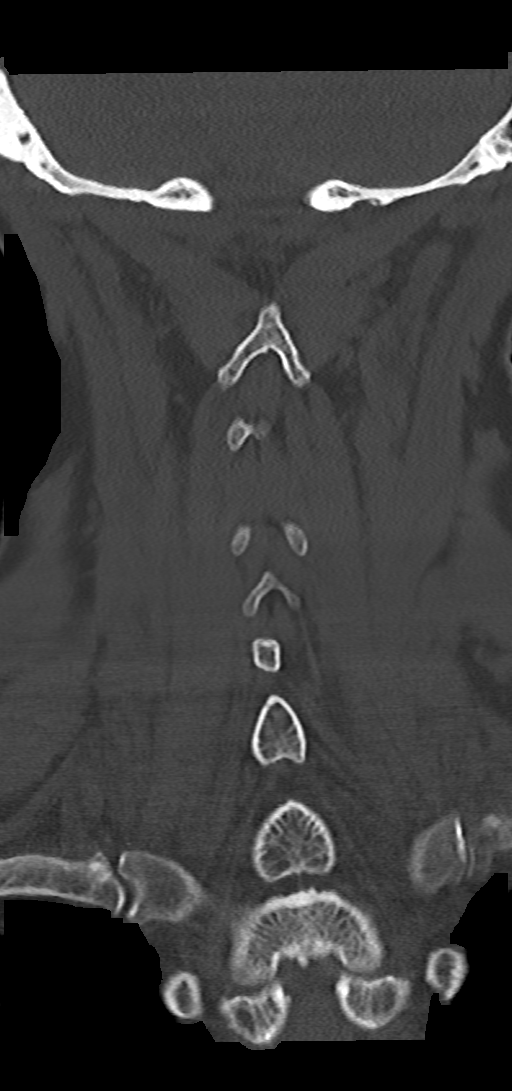

[Series 12: c_spine 2.0 orthogonals · axial · 0.21mm/px · z∈[-288,-219]mm · 2 of 93 slices shown]
[im 31/93  bone]
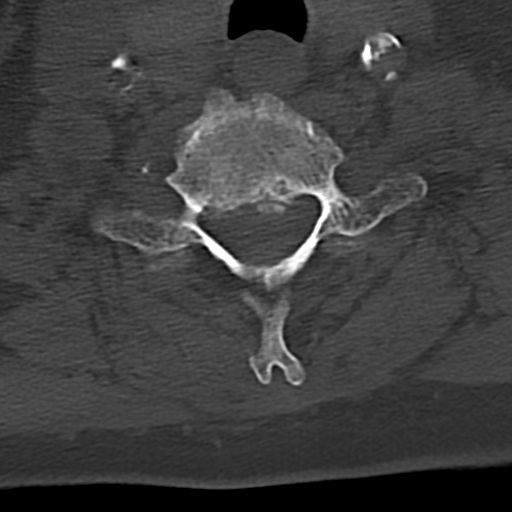
[im 62/93  bone]
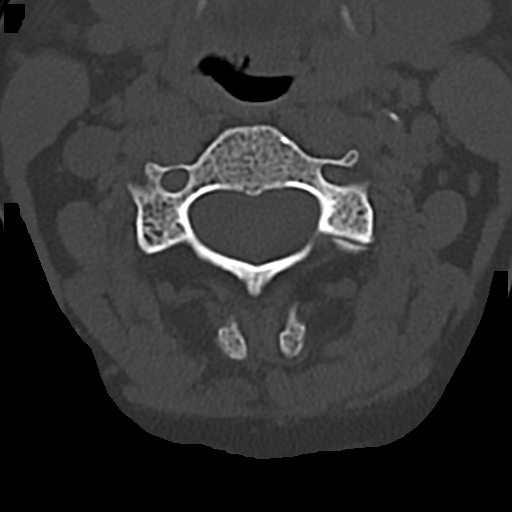

[11 of 33 positions shown; findings below may reference images not displayed]

FINDINGS: CT HEAD FINDINGS

Brain: Right frontal encephalomalacia is noted which extends into
right basal ganglia and is consistent with old infarction. No mass
effect or midline shift is noted. Ventricular size is within normal
limits. There is no evidence of mass lesion, hemorrhage or acute
infarction.

Vascular: No hyperdense vessel or unexpected calcification.

Skull: Normal. Negative for fracture or focal lesion.

Sinuses/Orbits: No acute finding.

Other: None.

CT CERVICAL SPINE FINDINGS

Alignment: Normal.

Skull base and vertebrae: No acute fracture. No primary bone lesion
or focal pathologic process.

Soft tissues and spinal canal: No prevertebral fluid or swelling. No
visible canal hematoma.

Disc levels: Severe degenerative disc disease is noted at C4-5, C5-6
and C6-7 with anterior posterior osteophyte formation.

Upper chest: Negative.

Other: None.
IMPRESSION: Old right frontal infarction. No acute intracranial abnormality
seen.

Severe multilevel degenerative disc disease. No acute abnormality
seen in the cervical spine.

## 2019-11-20 NOTE — Progress Notes (Signed)
Location:  [facility] Room Number: 304/B Place of Service:  SNF (31) Provider:  Durenda Age, DNP, FNP-BC  Patient Care Team: Sonia Side., FNP as PCP - General (Family Medicine) Minus Breeding, MD as PCP - Cardiology (Cardiology) Edrick Oh, MD as Consulting Physician (Nephrology) Concepcion Elk, MD (Internal Medicine)  Extended Emergency Contact Information Primary Emergency Contact: [facility] Phone: [phone] Relation: Son Interpreter needed? No Secondary Emergency Contact: [facility] Phone: [phone] Relation: Significant other  Code Status:  Full Code   Goals of care: Advanced Directive information Advanced Directives 11/20/2019  Does Patient Have a Medical Advance Directive? Yes  Type of Advance Directive (No Data)  Does patient want to make changes to medical advance directive? No - Patient declined  Would patient like information on creating a medical advance directive? -  Some encounter information is confidential and restricted. Go to Review Flowsheets activity to see all data.     Chief Complaint  Patient presents with  . Discharge Note    Discharge Visit    HPI:  Pt is a 69 y.o. female seen today for for discharge visit. She will discharge on 11/22/19 with Home Health PT, OT and Nurse for medication management.  She has been admitted to [facility] on 11/13/19 post hospitalization 11/11/19 to 11/13/19. She has PMH of CAD S/P stents, NSTEMI 10/05/17, ischemic cardiomyopathy, chronic systolic CHF (EF improved from 25% to 45-50%), essential hypertension, hyperlipidemia, COPD on home oxygen 3L/min, lung cancer S/P left lung resection 2017, ESRD on MWF HD, PAF and tobacco abuse. She presented to ED with recurrent chest pain and SOB. She was treated with BiPAP and urgent HD.   She was discharged to [facility] for short-term rehabilitation.   Past Medical  History:  Diagnosis Date  . AAA (abdominal aortic aneurysm) ([place]) 11/12/2019   4.1 cm on [place] 11/12/19 Recheck 1 year  . Alcohol abuse   . Arthralgia   . CAD (coronary artery disease) 2006, 2016   stents when in [place]  . Chronic combined systolic and diastolic HF (heart failure) ([place])   . COPD (chronic obstructive pulmonary disease) ([place])   . Depression   . Dyspnea   . ESRD (end stage renal disease) ([place])   . HTN (hypertension)   . Hypercholesterolemia    primarily ldl-p and small particles  . Lung cancer [facility])    Patient reports this with surgery.  No details.    [name] RAS (renal artery stenosis) ([place]) 2002   by cath tysinger  . Smoking    Past Surgical History:  Procedure Laterality Date  . abdominal aortogram     perclose of the right femoral artery  . AV FISTULA PLACEMENT Right 07/11/2018   Procedure: Right arm ARTERIOVENOUS FISTULA CREATION;  Surgeon: [name], MD;  Location: [facility];  Service: Vascular;  Laterality: Right;  . AVF placement Right   . CARDIAC CATHETERIZATION     left heart catheterization.  Coronary cineangiography. Lft ventricular cineangiography.    [name] LEFT HEART CATH AND CORONARY ANGIOGRAPHY N/A 11/01/2019   Procedure: LEFT HEART CATH AND CORONARY ANGIOGRAPHY;  Surgeon: Burnell Blanks, MD;  Location: [facility];  Service: Cardiovascular;  Laterality: N/A;  . LUNG SURGERY     due to lung cancer per patient's report  . TUBAL LIGATION      Allergies  Allergen Reactions  . Citalopram Hydrobromide Hives  . Morphine And Related Itching  . Tape  Other (See Comments)    PLASTIC, CLEAR TAPE STICKS TO THE SKIN AND POSSIBLY TEARS IT- PLEASE USE PAPER TAPE    Outpatient Encounter Medications as of 11/20/2019  Medication Sig  . albuterol (PROVENTIL HFA;VENTOLIN HFA) 108 (90 Base) MCG/ACT inhaler Inhale 2 puffs into the lungs every 4 (four) hours as needed for wheezing or shortness of breath.   [name] aspirin EC 81 MG tablet Take 81 mg by mouth  daily.  [name] atorvastatin (LIPITOR) 40 MG tablet Take 40 mg by mouth daily.  Lorin Picket 1 GM 210 MG(Fe) tablet Take 420-630 mg by mouth See admin instructions. Take 3 tablets (630 mg) by mouth M-W-F at bedtime. Give 3 tablets three times daily with meals Tue-Thu-Sat-Sun.  [name] b complex-vitamin c-folic acid (NEPHRO-VITE) 0.8 MG TABS tablet Take 1 tablet by mouth See admin instructions. Take 1 tablet by mouth in the morning on Sun/Tues/Thurs/Sat and 1 tablet after dialysis on Mon/Wed/Fri  . bisacodyl (DULCOLAX) 10 MG suppository Place 10 mg rectally as needed for moderate constipation.  . budesonide-formoterol (SYMBICORT) 160-4.5 MCG/ACT inhaler Inhale 2 puffs into the lungs 2 (two) times daily.  [name] buPROPion (WELLBUTRIN SR) 150 MG 12 hr tablet Take 150 mg by mouth 2 (two) times daily.  . clopidogrel (PLAVIX) 75 MG tablet Take 75 mg by mouth daily.  [name] lidocaine-prilocaine (EMLA) cream Apply 1 application topically every Monday, Wednesday, and Friday with hemodialysis. Prior to dialysis  . magnesium hydroxide (MILK OF MAGNESIA) 400 MG/5ML suspension Take 30 mLs by mouth daily as needed for mild constipation.  . metoprolol succinate (TOPROL-XL) 50 MG 24 hr tablet Take 50 mg by mouth daily. Take with or immediately following a meal.  . nitroGLYCERIN (NITROSTAT) 0.4 MG SL tablet Place 1 tablet (0.4 mg total) under the tongue every 5 (five) minutes as needed for chest pain.  . Nutritional Supplements (FEEDING SUPPLEMENT, NEPRO CARB STEADY,) LIQD Take 237 mLs by mouth 2 (two) times daily between meals.  . OXYGEN Inhale 3 L/min into the lungs continuous.   [name] QUEtiapine (SEROQUEL) 200 MG tablet Take 200 mg by mouth at bedtime.   . Sodium Phosphates (RA SALINE ENEMA) 19-7 GM/118ML ENEM Place 1 each rectally as needed (for constipation).  . [DISCONTINUED] ipratropium-albuterol (DUONEB) 0.5-2.5 (3) MG/3ML SOLN Take 3 mLs by nebulization every 6 (six) hours as needed (SOB).   No facility-administered encounter  medications on file as of 11/20/2019.    Review of Systems  GENERAL: No change in appetite, no fatigue, no weight changes, no fever, chills or weakness MOUTH and THROAT: Denies oral discomfort, gingival pain or bleeding, pain from teeth or hoarseness   RESPIRATORY: no cough, SOB, DOE, wheezing, hemoptysis CARDIAC: No chest pain, edema or palpitations GI: No abdominal pain, diarrhea, constipation, heart burn, nausea or vomiting GU: Denies dysuria, frequency, hematuria, incontinence, or discharge NEUROLOGICAL: Denies dizziness, syncope, numbness, or headache PSYCHIATRIC: Denies feelings of depression or anxiety. No report of hallucinations, insomnia, paranoia, or agitation   Immunization History  Administered Date(s) Administered  . Influenza-Unspecified 07/18/2019  . Pneumococcal Conjugate-13 11/25/2017   Pertinent  Health Maintenance Due  Topic Date Due  . FOOT EXAM  09/25/1960  . OPHTHALMOLOGY EXAM  09/25/1960  . URINE MICROALBUMIN  09/25/1960  . MAMMOGRAM  09/25/2000  . COLONOSCOPY  09/25/2000  . DEXA SCAN  09/26/2015  . PNA vac Low Risk Adult (2 of 2 - PPSV23) 11/25/2018  . HEMOGLOBIN A1C  05/01/2020  . INFLUENZA VACCINE  Completed   No flowsheet data found.  Vitals:   11/20/19 1321  BP: 127/69  Pulse: 76  Resp: 17  Temp: 97.7 F (36.5 C)  TempSrc: Oral  SpO2: 100%  Weight: 156 lb 9.6 oz (71 kg)  Height: 5\' 4"  (1.626 m)   Body mass index is 26.88 kg/m.  Physical Exam  GENERAL APPEARANCE: Well nourished. In no acute distress. Normal body habitus SKIN:  Skin is warm and dry. MOUTH and THROAT: Lips are without lesions. Oral mucosa is moist and without lesions. Tongue is normal in shape, size, and color and without lesions RESPIRATORY: Breathing is even & unlabored, BS CTAB CARDIAC: RRR, no murmur,no extra heart sounds, no edema GI: Abdomen soft, normal BS, no masses, no tenderness EXTREMITIES:  Able to move X 4 extremities NEUROLOGICAL: There is no tremor.  Speech is clear. PSYCHIATRIC:  Affect and behavior are appropriate  Labs reviewed: Recent Labs    07/10/19 1808 10/18/19 0850 10/19/19 0225 11/01/19 0335 11/03/19 1000 11/06/19 0805 11/11/19 0807 11/12/19 0604 11/13/19 0454  NA 132*  --   --  140 137 139 139 141 140  K 4.2  --   --  5.1 4.9 4.1 4.7 5.2* 4.6  CL 93*  --   --  95* 92* 97* 99 99 97*  CO2 25  --   --  30 25 24 25 28 25   GLUCOSE 129*  --   --  90 126* 93 121* 95 88  BUN 37*  --   --  80* 62* 82* 77* 43* 78*  CREATININE 4.07*  --   --  12.90* 8.95* 10.71* 10.29* 6.98* 9.44*  CALCIUM 8.5*  --   --  9.4 9.3 9.1 9.9 9.0 8.9  MG 2.0 3.1*  --  2.8*  --   --   --   --   --   PHOS 3.4  --    < > 7.3* 7.7* 6.2*  --   --   --    < > = values in this interval not displayed.   Recent Labs    10/18/19 0850 10/31/19 1451 11/01/19 0335 11/03/19 1000 11/06/19 0805  AST 30 20 17   --   --   ALT 14 16 14   --   --   ALKPHOS 91 99 91  --   --   BILITOT 0.7 0.5 0.3  --   --   PROT 6.7 6.8 5.7*  --   --   ALBUMIN 3.9 3.8 3.2* 3.4* 3.2*   Recent Labs    10/20/19 0734 10/31/19 1451 11/06/19 0805 11/11/19 0807 11/12/19 0604  WBC 6.3 7.7 7.3 9.3 5.7  NEUTROABS 4.7 6.0  --   --  4.6  HGB 10.3* 11.5* 9.7* 10.1* 9.5*  HCT 33.1* 36.3 30.3* 32.7* 30.4*  MCV 110.7* 108.7* 105.9* 109.4* 106.7*  PLT 189 210 238 258 240   Lab Results  Component Value Date   TSH 1.364 11/01/2019   Lab Results  Component Value Date   HGBA1C 5.1 11/01/2019   Lab Results  Component Value Date   CHOL 125 04/01/2019   HDL 51 04/01/2019   LDLCALC 59 04/01/2019   TRIG 76 04/01/2019   CHOLHDL 2.5 04/01/2019    Significant Diagnostic Results in last 30 days:  DG Chest 2 View  Result Date: 11/11/2019 CLINICAL DATA:  69 year old female with central chest pain and increased shortness of breath this morning. History of COPD, lung cancer. Multiple negative tests for COVID-19, most recently 11/05/2019. EXAM: CHEST - 2  VIEW COMPARISON:  Portable  chest 11/01/2019 and earlier. FINDINGS: PA and lateral views of the chest. Chronic large lung volumes with emphysema and postoperative changes in the left lung. Stable tortuous aorta with calcified atherosclerosis. Stable cardiac size at the upper limits of normal. Substantial regression but not complete resolution of the multifocal right lung reticulonodular opacity on 11/01/2019. Residual mostly at the lung base. No pneumothorax or pulmonary edema. No pleural effusion. No areas of worsening ventilation. No acute osseous abnormality identified. Possible aneurysmal enlargement of the abdominal aorta, lateral view roughly 4 centimeters diameter. Negative visible bowel gas pattern. IMPRESSION: 1. Chronic lung disease with significantly improved but not completely resolved right lung pneumonia since 11/01/2019. 2. No new cardiopulmonary abnormality. 3. Extensive Aortic Atherosclerosis (ICD10-I70.0) with questionable fusiform aneurysmal enlargement of the abdominal aorta. Follow-up Aorta Ultrasound would be the simplest way to confirm or exclude AAA. Electronically Signed   By: Genevie Ann M.D.   On: 11/11/2019 08:47   CARDIAC CATHETERIZATION  Result Date: 11/01/2019  Prox RCA to Mid RCA lesion is 30% stenosed.  Prox Cx to Mid Cx lesion is 20% stenosed.  Mid Cx lesion is 50% stenosed.  Ost Cx to Prox Cx lesion is 40% stenosed.  Ost LAD to Mid LAD lesion is 30% stenosed.  1st Sept lesion is 60% stenosed.  There is moderate left ventricular systolic dysfunction.  LV end diastolic pressure is moderately elevated.  The left ventricular ejection fraction is 35-45% by visual estimate.  There is mild (2+) mitral regurgitation.  1. Diffuse, mild non-obstructive disease in the LAD 2. Patent stent in the mid Circumflex with minimal restenosis. Moderate stenosis just beyond the stented segment of the mid Circumflex artery. This does not appear to be flow limiting. 3. Mild non-obstructive disease in the mid RCA 4.  Moderate, global LV systolic dysfunction. 5. Elevated filling pressures Recommendations; Medical management of CAD. Explore other causes of chest pain.   US AORTA  Result Date: 11/12/2019 CLINICAL DATA:  AAA seen on chest imaging EXAM: ULTRASOUND OF ABDOMINAL AORTA TECHNIQUE: Ultrasound examination of the abdominal aorta and proximal common iliac arteries was performed to evaluate for aneurysm. Additional color and Doppler images of the distal aorta were obtained to document patency. 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 Recommend followup by ultrasound in 1 year. This recommendation follows ACR consensus guidelines: White Paper of the ACR Incidental Findings Committee II on Vascular Findings. J Am Coll Radiol 2013; 10:789-794. Aortic aneurysm NOS (ICD10-I71.9) Electronically Signed   By: Lovena Le M.D.   On: 11/12/2019 02:02   DG Chest Port 1 View  Result Date: 11/01/2019 CLINICAL DATA:  Shortness of breath EXAM: PORTABLE CHEST 1 VIEW COMPARISON:  10/31/2019 FINDINGS: Cardiomegaly. There is hyperinflation of the lungs compatible with COPD. Aortic atherosclerosis. Airspace opacities bilaterally, most confluent in the right mid and lower lung. Appearance is concerning for pneumonia. Diffuse interstitial prominence could also be related to infection or interstitial edema. IMPRESSION: Cardiomegaly, COPD.  Possible interstitial edema. Confluent airspace disease in the right mid and lower lung concerning for pneumonia. Electronically Signed   By: Rolm Baptise M.D.   On: 11/01/2019 17:41   DG Chest Portable 1 View  Result Date: 10/31/2019  CLINICAL DATA:  Chest pain and shortness of breath EXAM: PORTABLE  CHEST 1 VIEW COMPARISON:  October 19, 2019 FINDINGS: There is slight scarring in the bases. There is no edema or consolidation. Heart is upper normal in size with pulmonary vascularity normal. No adenopathy. There is aortic atherosclerosis. There are foci of calcification in each subclavian and axillary artery. No bone lesions. IMPRESSION: Mild bibasilar scarring. No edema or consolidation. Stable cardiac silhouette. No adenopathy. There is extensive arterial vascular calcification. Aortic Atherosclerosis (ICD10-I70.0). Electronically Signed   By: Lowella Grip III M.D.   On: 10/31/2019 15:59    Assessment/Plan  1. ESRD on hemodialysis ([name]) - hemodialysis 3X/week - lidocaine-prilocaine (EMLA) cream; Apply 1 application topically every Monday, Wednesday, and Friday with hemodialysis. Prior to dialysis  Dispense: 30 g; Refill: 0  2. Chronic systolic CHF (congestive heart failure) ([name]) - recently followed up at [facility] on 11/16/19 - metoprolol succinate (TOPROL-XL) 50 MG 24 hr tablet; Take 1 tablet (50 mg total) by mouth daily. Take with or immediately following a meal.  Dispense: 30 tablet; Refill: 0  3. Anemia of chronic disease - AURYXIA 1 GM 210 MG(Fe) tablet; Take 2-3 tablets (420-630 mg total) by mouth See admin instructions. Take 3 tablets (630 mg) by mouth M-W-F at bedtime. Give 3 tablets three times daily with meals Tue-Thu-Sat-Sun.  Dispense: 270 tablet; Refill: 0  4. Atherosclerosis of coronary artery of native heart with unstable angina pectoris, unspecified vessel or lesion type ([name]) - atorvastatin (LIPITOR) 40 MG tablet; Take 1 tablet (40 mg total) by mouth daily.  Dispense: 30 tablet; Refill: 0 - clopidogrel (PLAVIX) 75 MG tablet; Take 1 tablet (75 mg total) by mouth daily.  Dispense: 30 tablet; Refill: 0 - nitroGLYCERIN (NITROSTAT) 0.4 MG SL tablet; Place 1 tablet (0.4 mg total) under the tongue every 5 (five) minutes as needed for chest pain.  Dispense: 90 tablet; Refill:  0  5. Depression with anxiety - buPROPion (WELLBUTRIN SR) 150 MG 12 hr tablet; Take 1 tablet (150 mg total) by mouth 2 (two) times daily.  Dispense: 60 tablet; Refill: 0 - QUEtiapine (SEROQUEL) 200 MG tablet; Take 1 tablet (200 mg total) by mouth at bedtime.  Dispense: 30 tablet; Refill: 0  6. Chronic obstructive pulmonary disease, unspecified COPD type ([name]) - albuterol (VENTOLIN HFA) 108 (90 Base) MCG/ACT inhaler; Inhale 2 puffs into the lungs every 4 (four) hours as needed for wheezing or shortness of breath.  Dispense: 6.7 g; Refill: 0 - budesonide-formoterol (SYMBICORT) 160-4.5 MCG/ACT inhaler; Inhale 2 puffs into the lungs 2 (two) times daily.  Dispense: 1 Inhaler; Refill: 0  7. Neurocognitive deficits - scored 8/20 on SLUMS administered on 12/28, needs supervision at home by husband     I have filled out patient's discharge paperwork and e-prescribed medications.  Patient will receive home health PT, OT and Nurse.  DME provided:  Rolling walker and 3-in-1  Total discharge time: Greater than 30 minutes Greater than 50% was spent in counseling and coordination of care.   Discharge time involved coordination of the discharge process with social worker, nursing staff and therapy department. Medical justification for home health services/DME verified.   Durenda Age, DNP, FNP-BC [facility] [phone] (Monday-Friday 8:00 a.m. - 5:00 p.m.) [phone] (after hours)

## 2019-11-21 ENCOUNTER — Other Ambulatory Visit: Payer: Self-pay | Admitting: Adult Health

## 2019-11-21 DIAGNOSIS — I25110 Atherosclerotic heart disease of native coronary artery with unstable angina pectoris: Secondary | ICD-10-CM

## 2019-11-21 DIAGNOSIS — I5022 Chronic systolic (congestive) heart failure: Secondary | ICD-10-CM

## 2019-11-21 MED ORDER — CLOPIDOGREL BISULFATE 75 MG PO TABS: 75.0000 mg | ORAL_TABLET | Freq: Every day | ORAL | 0 refills | Status: AC

## 2019-11-21 MED ORDER — AURYXIA 1 GM 210 MG(FE) PO TABS: 420.0000 mg | ORAL_TABLET | ORAL | 0 refills | Status: AC

## 2019-11-21 MED ORDER — BUPROPION HCL ER (SR) 150 MG PO TB12: 150.0000 mg | ORAL_TABLET | Freq: Two times a day (BID) | ORAL | 0 refills | Status: AC

## 2019-11-21 MED ORDER — LIDOCAINE-PRILOCAINE 2.5-2.5 % EX CREA: 1.0000 "application " | TOPICAL_CREAM | CUTANEOUS | 0 refills | Status: AC

## 2019-11-21 MED ORDER — METOPROLOL SUCCINATE ER 50 MG PO TB24: 50.0000 mg | ORAL_TABLET | Freq: Every day | ORAL | 0 refills | Status: AC

## 2019-11-21 MED ORDER — NITROGLYCERIN 0.4 MG SL SUBL: 0.4000 mg | SUBLINGUAL_TABLET | SUBLINGUAL | 0 refills | Status: AC | PRN

## 2019-11-21 MED ORDER — QUETIAPINE FUMARATE 200 MG PO TABS: 200.0000 mg | ORAL_TABLET | Freq: Every day | ORAL | 0 refills | Status: AC

## 2019-11-21 MED ORDER — BUDESONIDE-FORMOTEROL FUMARATE 160-4.5 MCG/ACT IN AERO: 2.0000 | INHALATION_SPRAY | Freq: Two times a day (BID) | RESPIRATORY_TRACT | 0 refills | Status: AC

## 2019-11-21 MED ORDER — ATORVASTATIN CALCIUM 40 MG PO TABS: 40.0000 mg | ORAL_TABLET | Freq: Every day | ORAL | 0 refills | Status: AC

## 2019-11-21 MED ORDER — ALBUTEROL SULFATE HFA 108 (90 BASE) MCG/ACT IN AERS: 2.0000 | INHALATION_SPRAY | RESPIRATORY_TRACT | 0 refills | Status: AC | PRN

## 2019-11-23 DIAGNOSIS — J9621 Acute and chronic respiratory failure with hypoxia: Secondary | ICD-10-CM

## 2019-11-23 DIAGNOSIS — M6281 Muscle weakness (generalized): Secondary | ICD-10-CM

## 2019-11-23 DIAGNOSIS — J441 Chronic obstructive pulmonary disease with (acute) exacerbation: Secondary | ICD-10-CM

## 2019-11-23 DIAGNOSIS — I5023 Acute on chronic systolic (congestive) heart failure: Secondary | ICD-10-CM

## 2019-11-23 DIAGNOSIS — I120 Hypertensive chronic kidney disease with stage 5 chronic kidney disease or end stage renal disease: Secondary | ICD-10-CM

## 2019-11-26 ENCOUNTER — Encounter (HOSPITAL_COMMUNITY): Payer: Self-pay

## 2019-11-26 ENCOUNTER — Emergency Department (HOSPITAL_COMMUNITY): Payer: Medicare Other

## 2019-11-26 ENCOUNTER — Inpatient Hospital Stay (HOSPITAL_COMMUNITY)
Admission: EM | Admit: 2019-11-26 | Discharge: 2019-12-18 | DRG: 291 | Disposition: E | Payer: Medicare Other | Attending: Pulmonary Disease | Admitting: Pulmonary Disease

## 2019-11-26 ENCOUNTER — Other Ambulatory Visit: Payer: Self-pay

## 2019-11-26 DIAGNOSIS — N179 Acute kidney failure, unspecified: Secondary | ICD-10-CM | POA: Diagnosis not present

## 2019-11-26 DIAGNOSIS — J9601 Acute respiratory failure with hypoxia: Secondary | ICD-10-CM | POA: Diagnosis present

## 2019-11-26 DIAGNOSIS — J449 Chronic obstructive pulmonary disease, unspecified: Secondary | ICD-10-CM | POA: Diagnosis not present

## 2019-11-26 DIAGNOSIS — G9340 Encephalopathy, unspecified: Secondary | ICD-10-CM | POA: Diagnosis not present

## 2019-11-26 DIAGNOSIS — E7800 Pure hypercholesterolemia, unspecified: Secondary | ICD-10-CM | POA: Diagnosis present

## 2019-11-26 DIAGNOSIS — Z885 Allergy status to narcotic agent status: Secondary | ICD-10-CM | POA: Diagnosis not present

## 2019-11-26 DIAGNOSIS — J9620 Acute and chronic respiratory failure, unspecified whether with hypoxia or hypercapnia: Secondary | ICD-10-CM

## 2019-11-26 DIAGNOSIS — I469 Cardiac arrest, cause unspecified: Secondary | ICD-10-CM | POA: Diagnosis not present

## 2019-11-26 DIAGNOSIS — Z992 Dependence on renal dialysis: Secondary | ICD-10-CM | POA: Diagnosis not present

## 2019-11-26 DIAGNOSIS — Z9981 Dependence on supplemental oxygen: Secondary | ICD-10-CM

## 2019-11-26 DIAGNOSIS — Z20822 Contact with and (suspected) exposure to covid-19: Secondary | ICD-10-CM | POA: Diagnosis present

## 2019-11-26 DIAGNOSIS — I701 Atherosclerosis of renal artery: Secondary | ICD-10-CM | POA: Diagnosis present

## 2019-11-26 DIAGNOSIS — Z809 Family history of malignant neoplasm, unspecified: Secondary | ICD-10-CM

## 2019-11-26 DIAGNOSIS — Z91048 Other nonmedicinal substance allergy status: Secondary | ICD-10-CM | POA: Diagnosis not present

## 2019-11-26 DIAGNOSIS — Z888 Allergy status to other drugs, medicaments and biological substances status: Secondary | ICD-10-CM

## 2019-11-26 DIAGNOSIS — Z87891 Personal history of nicotine dependence: Secondary | ICD-10-CM

## 2019-11-26 DIAGNOSIS — I2510 Atherosclerotic heart disease of native coronary artery without angina pectoris: Secondary | ICD-10-CM | POA: Diagnosis present

## 2019-11-26 DIAGNOSIS — Z7951 Long term (current) use of inhaled steroids: Secondary | ICD-10-CM

## 2019-11-26 DIAGNOSIS — I714 Abdominal aortic aneurysm, without rupture: Secondary | ICD-10-CM | POA: Diagnosis present

## 2019-11-26 DIAGNOSIS — E1122 Type 2 diabetes mellitus with diabetic chronic kidney disease: Secondary | ICD-10-CM | POA: Diagnosis present

## 2019-11-26 DIAGNOSIS — Z825 Family history of asthma and other chronic lower respiratory diseases: Secondary | ICD-10-CM

## 2019-11-26 DIAGNOSIS — I5043 Acute on chronic combined systolic (congestive) and diastolic (congestive) heart failure: Secondary | ICD-10-CM | POA: Diagnosis present

## 2019-11-26 DIAGNOSIS — I255 Ischemic cardiomyopathy: Secondary | ICD-10-CM | POA: Diagnosis present

## 2019-11-26 DIAGNOSIS — D638 Anemia in other chronic diseases classified elsewhere: Secondary | ICD-10-CM | POA: Diagnosis present

## 2019-11-26 DIAGNOSIS — Z85118 Personal history of other malignant neoplasm of bronchus and lung: Secondary | ICD-10-CM

## 2019-11-26 DIAGNOSIS — N186 End stage renal disease: Secondary | ICD-10-CM | POA: Diagnosis present

## 2019-11-26 DIAGNOSIS — Z7902 Long term (current) use of antithrombotics/antiplatelets: Secondary | ICD-10-CM | POA: Diagnosis not present

## 2019-11-26 DIAGNOSIS — J9621 Acute and chronic respiratory failure with hypoxia: Secondary | ICD-10-CM | POA: Diagnosis present

## 2019-11-26 DIAGNOSIS — I132 Hypertensive heart and chronic kidney disease with heart failure and with stage 5 chronic kidney disease, or end stage renal disease: Principal | ICD-10-CM | POA: Diagnosis present

## 2019-11-26 DIAGNOSIS — R0602 Shortness of breath: Secondary | ICD-10-CM | POA: Diagnosis present

## 2019-11-26 DIAGNOSIS — E872 Acidosis: Secondary | ICD-10-CM | POA: Diagnosis not present

## 2019-11-26 DIAGNOSIS — E875 Hyperkalemia: Secondary | ICD-10-CM | POA: Diagnosis not present

## 2019-11-26 DIAGNOSIS — F419 Anxiety disorder, unspecified: Secondary | ICD-10-CM | POA: Diagnosis not present

## 2019-11-26 DIAGNOSIS — I480 Paroxysmal atrial fibrillation: Secondary | ICD-10-CM | POA: Diagnosis present

## 2019-11-26 DIAGNOSIS — Z955 Presence of coronary angioplasty implant and graft: Secondary | ICD-10-CM

## 2019-11-26 DIAGNOSIS — Z902 Acquired absence of lung [part of]: Secondary | ICD-10-CM

## 2019-11-26 DIAGNOSIS — F329 Major depressive disorder, single episode, unspecified: Secondary | ICD-10-CM | POA: Diagnosis present

## 2019-11-26 DIAGNOSIS — Z7982 Long term (current) use of aspirin: Secondary | ICD-10-CM

## 2019-11-26 DIAGNOSIS — I351 Nonrheumatic aortic (valve) insufficiency: Secondary | ICD-10-CM | POA: Diagnosis not present

## 2019-11-26 DIAGNOSIS — E878 Other disorders of electrolyte and fluid balance, not elsewhere classified: Secondary | ICD-10-CM

## 2019-11-26 DIAGNOSIS — E785 Hyperlipidemia, unspecified: Secondary | ICD-10-CM | POA: Diagnosis present

## 2019-11-26 DIAGNOSIS — N19 Unspecified kidney failure: Secondary | ICD-10-CM

## 2019-11-26 DIAGNOSIS — Z9289 Personal history of other medical treatment: Secondary | ICD-10-CM

## 2019-11-26 DIAGNOSIS — I361 Nonrheumatic tricuspid (valve) insufficiency: Secondary | ICD-10-CM | POA: Diagnosis not present

## 2019-11-26 DIAGNOSIS — D631 Anemia in chronic kidney disease: Secondary | ICD-10-CM | POA: Diagnosis present

## 2019-11-26 DIAGNOSIS — R570 Cardiogenic shock: Secondary | ICD-10-CM | POA: Diagnosis not present

## 2019-11-26 DIAGNOSIS — E8770 Fluid overload, unspecified: Secondary | ICD-10-CM

## 2019-11-26 DIAGNOSIS — J189 Pneumonia, unspecified organism: Secondary | ICD-10-CM | POA: Diagnosis not present

## 2019-11-26 DIAGNOSIS — Z452 Encounter for adjustment and management of vascular access device: Secondary | ICD-10-CM

## 2019-11-26 DIAGNOSIS — J432 Centrilobular emphysema: Secondary | ICD-10-CM | POA: Diagnosis present

## 2019-11-26 LAB — POCT I-STAT EG7
Acid-base deficit: 2 mmol/L (ref 0.0–2.0)
Bicarbonate: 23.6 mmol/L (ref 20.0–28.0)
Calcium, Ion: 1.05 mmol/L — ABNORMAL LOW (ref 1.15–1.40)
HCT: 33 % — ABNORMAL LOW (ref 36.0–46.0)
Hemoglobin: 11.2 g/dL — ABNORMAL LOW (ref 12.0–15.0)
O2 Saturation: 98 %
Potassium: 5.8 mmol/L — ABNORMAL HIGH (ref 3.5–5.1)
Sodium: 138 mmol/L (ref 135–145)
TCO2: 25 mmol/L (ref 22–32)
pCO2, Ven: 42.9 mmHg — ABNORMAL LOW (ref 44.0–60.0)
pH, Ven: 7.348 (ref 7.250–7.430)
pO2, Ven: 117 mmHg — ABNORMAL HIGH (ref 32.0–45.0)

## 2019-11-26 LAB — CBC
HCT: 34.7 % — ABNORMAL LOW (ref 36.0–46.0)
Hemoglobin: 10.6 g/dL — ABNORMAL LOW (ref 12.0–15.0)
MCH: 34.5 pg — ABNORMAL HIGH (ref 26.0–34.0)
MCHC: 30.5 g/dL (ref 30.0–36.0)
MCV: 113 fL — ABNORMAL HIGH (ref 80.0–100.0)
Platelets: 248 10*3/uL (ref 150–400)
RBC: 3.07 MIL/uL — ABNORMAL LOW (ref 3.87–5.11)
RDW: 18.5 % — ABNORMAL HIGH (ref 11.5–15.5)
WBC: 13.2 10*3/uL — ABNORMAL HIGH (ref 4.0–10.5)
nRBC: 0.5 % — ABNORMAL HIGH (ref 0.0–0.2)

## 2019-11-26 LAB — COMPREHENSIVE METABOLIC PANEL
ALT: 14 U/L (ref 0–44)
AST: 28 U/L (ref 15–41)
Albumin: 3.8 g/dL (ref 3.5–5.0)
Alkaline Phosphatase: 106 U/L (ref 38–126)
Anion gap: 22 — ABNORMAL HIGH (ref 5–15)
BUN: 73 mg/dL — ABNORMAL HIGH (ref 8–23)
CO2: 20 mmol/L — ABNORMAL LOW (ref 22–32)
Calcium: 9.3 mg/dL (ref 8.9–10.3)
Chloride: 98 mmol/L (ref 98–111)
Creatinine, Ser: 12.55 mg/dL — ABNORMAL HIGH (ref 0.44–1.00)
GFR calc Af Amer: 3 mL/min — ABNORMAL LOW (ref >60)
GFR calc non Af Amer: 3 mL/min — ABNORMAL LOW (ref >60)
Glucose, Bld: 160 mg/dL — ABNORMAL HIGH (ref 70–99)
Potassium: 5.7 mmol/L — ABNORMAL HIGH (ref 3.5–5.1)
Sodium: 140 mmol/L (ref 135–145)
Total Bilirubin: 0.7 mg/dL (ref 0.3–1.2)
Total Protein: 6.8 g/dL (ref 6.5–8.1)

## 2019-11-26 LAB — I-STAT CHEM 8, ED
BUN: 87 mg/dL — ABNORMAL HIGH (ref 8–23)
Calcium, Ion: 1.04 mmol/L — ABNORMAL LOW (ref 1.15–1.40)
Chloride: 103 mmol/L (ref 98–111)
Creatinine, Ser: 13.2 mg/dL — ABNORMAL HIGH (ref 0.44–1.00)
Glucose, Bld: 150 mg/dL — ABNORMAL HIGH (ref 70–99)
HCT: 33 % — ABNORMAL LOW (ref 36.0–46.0)
Hemoglobin: 11.2 g/dL — ABNORMAL LOW (ref 12.0–15.0)
Potassium: 5.7 mmol/L — ABNORMAL HIGH (ref 3.5–5.1)
Sodium: 138 mmol/L (ref 135–145)
TCO2: 24 mmol/L (ref 22–32)

## 2019-11-26 LAB — LACTIC ACID, PLASMA
Lactic Acid, Venous: 3.4 mmol/L (ref 0.5–1.9)
Lactic Acid, Venous: 3.5 mmol/L (ref 0.5–1.9)

## 2019-11-26 LAB — POC SARS CORONAVIRUS 2 AG -  ED: SARS Coronavirus 2 Ag: NEGATIVE

## 2019-11-26 LAB — BRAIN NATRIURETIC PEPTIDE: B Natriuretic Peptide: >4500 pg/mL — ABNORMAL HIGH (ref 0.0–100.0)

## 2019-11-26 LAB — TROPONIN I (HIGH SENSITIVITY)
Troponin I (High Sensitivity): 108 ng/L (ref <18)
Troponin I (High Sensitivity): 126 ng/L (ref <18)

## 2019-11-26 MED ORDER — ACETAMINOPHEN 325 MG PO TABS: 650.0000 mg | ORAL_TABLET | Freq: Four times a day (QID) | ORAL | Status: DC | PRN

## 2019-11-26 MED ORDER — FUROSEMIDE 10 MG/ML IJ SOLN
80.0000 mg | Freq: Once | INTRAMUSCULAR | Status: AC
  Administered 2019-11-26: 23:00:00 80 mg via INTRAVENOUS
  Filled 2019-11-26: qty 8

## 2019-11-26 MED ORDER — RENA-VITE PO TABS
1.0000 | ORAL_TABLET | Freq: Every day | ORAL | Status: DC
  Administered 2019-11-27 (×3): 1 via ORAL
  Filled 2019-11-26 (×4): qty 1

## 2019-11-26 MED ORDER — FERRIC CITRATE 1 GM 210 MG(FE) PO TABS
630.0000 mg | ORAL_TABLET | ORAL | Status: DC
  Filled 2019-11-26: qty 3

## 2019-11-26 MED ORDER — CLOPIDOGREL BISULFATE 75 MG PO TABS
75.0000 mg | ORAL_TABLET | Freq: Every day | ORAL | Status: DC
  Administered 2019-11-27: 75 mg via ORAL
  Filled 2019-11-26: qty 1

## 2019-11-26 MED ORDER — FENTANYL CITRATE (PF) 100 MCG/2ML IJ SOLN
50.0000 ug | Freq: Once | INTRAMUSCULAR | Status: AC
  Administered 2019-11-26: 18:00:00 50 ug via INTRAVENOUS
  Filled 2019-11-26: qty 2

## 2019-11-26 MED ORDER — METOPROLOL SUCCINATE ER 50 MG PO TB24
50.0000 mg | ORAL_TABLET | Freq: Every day | ORAL | Status: DC
  Administered 2019-11-27: 50 mg via ORAL
  Filled 2019-11-26: qty 1

## 2019-11-26 MED ORDER — MOMETASONE FURO-FORMOTEROL FUM 200-5 MCG/ACT IN AERO
2.0000 | INHALATION_SPRAY | Freq: Two times a day (BID) | RESPIRATORY_TRACT | Status: DC
  Filled 2019-11-26: qty 8.8

## 2019-11-26 MED ORDER — METOPROLOL TARTRATE 5 MG/5ML IV SOLN
5.0000 mg | Freq: Once | INTRAVENOUS | Status: AC
  Administered 2019-11-26: 20:00:00 5 mg via INTRAVENOUS
  Filled 2019-11-26: qty 5

## 2019-11-26 MED ORDER — BUPROPION HCL ER (SR) 150 MG PO TB12
150.0000 mg | ORAL_TABLET | Freq: Two times a day (BID) | ORAL | Status: DC
  Administered 2019-11-27 (×3): 150 mg via ORAL
  Filled 2019-11-26 (×4): qty 1

## 2019-11-26 MED ORDER — ATORVASTATIN CALCIUM 40 MG PO TABS
40.0000 mg | ORAL_TABLET | Freq: Every day | ORAL | Status: DC
  Administered 2019-11-26 – 2019-11-27 (×2): 40 mg via ORAL
  Filled 2019-11-26 (×2): qty 1

## 2019-11-26 MED ORDER — SODIUM CHLORIDE 0.9 % IV SOLN
1.0000 g | Freq: Once | INTRAVENOUS | Status: AC
  Administered 2019-11-26: 1 g via INTRAVENOUS
  Filled 2019-11-26: qty 10

## 2019-11-26 MED ORDER — ACETAMINOPHEN 650 MG RE SUPP: 650.0000 mg | Freq: Four times a day (QID) | RECTAL | Status: DC | PRN

## 2019-11-26 MED ORDER — HEPARIN SODIUM (PORCINE) 5000 UNIT/ML IJ SOLN
5000.0000 [IU] | Freq: Two times a day (BID) | INTRAMUSCULAR | Status: DC
  Administered 2019-11-26 – 2019-11-27 (×3): 5000 [IU] via SUBCUTANEOUS
  Filled 2019-11-26 (×3): qty 1

## 2019-11-26 MED ORDER — ALBUTEROL SULFATE HFA 108 (90 BASE) MCG/ACT IN AERS
2.0000 | INHALATION_SPRAY | RESPIRATORY_TRACT | Status: DC | PRN
  Administered 2019-11-27 (×2): 2 via RESPIRATORY_TRACT
  Filled 2019-11-26: qty 6.7

## 2019-11-26 MED ORDER — ASPIRIN 81 MG PO CHEW
324.0000 mg | CHEWABLE_TABLET | Freq: Once | ORAL | Status: AC
  Administered 2019-11-26: 324 mg via ORAL
  Filled 2019-11-26: qty 4

## 2019-11-26 MED ORDER — NITROGLYCERIN 0.4 MG SL SUBL
0.4000 mg | SUBLINGUAL_TABLET | SUBLINGUAL | Status: DC | PRN
  Administered 2019-11-27 (×3): 0.4 mg via SUBLINGUAL
  Filled 2019-11-26 (×2): qty 1

## 2019-11-26 MED ORDER — IOHEXOL 350 MG/ML SOLN
75.0000 mL | Freq: Once | INTRAVENOUS | Status: AC | PRN
  Administered 2019-11-26: 75 mL via INTRAVENOUS

## 2019-11-26 MED ORDER — NEPRO/CARBSTEADY PO LIQD
237.0000 mL | Freq: Two times a day (BID) | ORAL | Status: DC
  Filled 2019-11-26: qty 237

## 2019-11-26 MED ORDER — ASPIRIN EC 81 MG PO TBEC
81.0000 mg | DELAYED_RELEASE_TABLET | Freq: Every day | ORAL | Status: DC
  Administered 2019-11-27: 08:00:00 81 mg via ORAL
  Filled 2019-11-26: qty 1

## 2019-11-26 MED ORDER — NITROGLYCERIN 2 % TD OINT
0.5000 [in_us] | TOPICAL_OINTMENT | Freq: Four times a day (QID) | TRANSDERMAL | Status: DC
  Administered 2019-11-27 (×3): 0.5 [in_us] via TOPICAL
  Filled 2019-11-26: qty 1
  Filled 2019-11-26: qty 2
  Filled 2019-11-26 (×2): qty 1

## 2019-11-26 NOTE — ED Provider Notes (Signed)
[facility] Emergency Department Provider Note MRN:  [id number]  Arrival date & time: 11/29/2019     Chief Complaint   Shortness of Breath   History of Present Illness   Kari Robinson is a 69 y.o. year-old female with a history of CAD, COPD, ESRD, CHF presenting to the ED with chief complaint of shortness of breath.  Patient explains that she missed dialysis on Friday due to transportation issues.  Increase shortness of breath today.  Was found in distress with EMS.  Provided with Solu-Medrol, magnesium, nitro tablet, CPAP.  Was initially 70s on her baseline 3 L nasal cannula, has improved to the 90s.  Patient also endorsing right-sided dull chest pain.  Denies fever or cough.  No abdominal pain, no vomiting, no diarrhea.  Review of Systems  Positive for shortness of breath, chest pain.  Patient's Health History    Past Medical History:  Diagnosis Date  . AAA (abdominal aortic aneurysm) ([place]) 11/12/2019   4.1 cm on [place] 11/12/19 Recheck 1 year  . Alcohol abuse   . Arthralgia   . CAD (coronary artery disease) 2006, 2016   stents when in [place]  . Chronic combined systolic and diastolic HF (heart failure) ([place])   . COPD (chronic obstructive pulmonary disease) ([place])   . Depression   . Dyspnea   . ESRD (end stage renal disease) ([place])   . HTN (hypertension)   . Hypercholesterolemia    primarily ldl-p and small particles  . Lung cancer [facility])    Patient reports this with surgery.  No details.    [name] RAS (renal artery stenosis) ([place]) 2002   by cath tysinger  . Smoking     Past Surgical History:  Procedure Laterality Date  . abdominal aortogram     perclose of the right femoral artery  . AV FISTULA PLACEMENT Right 07/11/2018   Procedure: Right arm ARTERIOVENOUS FISTULA CREATION;  Surgeon: [name], MD;  Location: [place];  Service: Vascular;  Laterality: Right;  . AVF placement Right   . CARDIAC CATHETERIZATION     left heart catheterization.   Coronary cineangiography. Lft ventricular cineangiography.    [name] LEFT HEART CATH AND CORONARY ANGIOGRAPHY N/A 11/01/2019   Procedure: LEFT HEART CATH AND CORONARY ANGIOGRAPHY;  Surgeon: Burnell Blanks, MD;  Location: [place] [facility];  Service: Cardiovascular;  Laterality: N/A;  . LUNG SURGERY     due to lung cancer per patient's report  . TUBAL LIGATION      Family History  Problem Relation Age of Onset  . Emphysema Father   . Cancer Brother     Social History   Socioeconomic History  . Marital status: Divorced    Spouse name: CAR Sommerfeld  . Number of children: 5  . Years of education: 9  . Highest education level: 9th grade  Occupational History  . Occupation: waitress  Tobacco Use  . Smoking status: Former Smoker    Years: 35.00    Types: Cigarettes    Quit date: 05/16/2019    Years since quitting: 0.5  . Smokeless tobacco: Never Used  . Tobacco comment: 3-4 cigarettes per day  Substance and Sexual Activity  . Alcohol use: Not Currently    Comment: states has quit drinking "a few months ago"  . Drug use: Never  . Sexual activity: Not Currently    Birth control/protection: None  Other Topics Concern  . Not on file  Social History Narrative   ** Merged  History Encounter **       Social Determinants of Health   Financial Resource Strain: Medium Risk  . Difficulty of Paying Living Expenses: Somewhat hard  Food Insecurity: Food Insecurity Present  . Worried About Charity fundraiser in the Last Year: Sometimes true  . Ran Out of Food in the Last Year: Sometimes true  Transportation Needs: No Transportation Needs  . Lack of Transportation (Medical): No  . Lack of Transportation (Non-Medical): No  Physical Activity: Inactive  . Days of Exercise per Week: 0 days  . Minutes of Exercise per Session: 0 min  Stress: Stress Concern Present  . Feeling of Stress : Very much  Social Connections: Somewhat Isolated  . Frequency of Communication with Friends and  Family: More than three times a week  . Frequency of Social Gatherings with Friends and Family: Once a week  . Attends Religious Services: 1 to 4 times per year  . Active Member of Clubs or Organizations: No  . Attends Archivist Meetings: Never  . Marital Status: Divorced  Human resources officer Violence: Not At Risk  . Fear of Current or Ex-Partner: No  . Emotionally Abused: No  . Physically Abused: No  . Sexually Abused: No     Physical Exam  Vital Signs and Nursing Notes reviewed Vitals:   12/13/2019 2030 12/13/2019 2100  BP: (!) 156/93 (!) 159/94  Pulse: 96 100  Resp: 14   Temp:    SpO2: 99% 100%    CONSTITUTIONAL: Well-appearing, in mild respiratory distress NEURO:  Alert and oriented x 3, no focal deficits EYES:  eyes equal and reactive ENT/NECK:  no LAD, no JVD CARDIO: Tachycardic rate, well-perfused, normal S1 and S2 PULM: Decreased breath sounds bilateral bases, no wheezing GI/GU:  normal bowel sounds, non-distended, non-tender MSK/SPINE:  No gross deformities, no edema SKIN:  no rash, atraumatic PSYCH:  Appropriate speech and behavior  Diagnostic and Interventional Summary    EKG Interpretation  Date/Time:  Sunday November 26 2019 16:28:04 EST Ventricular Rate:  135 PR Interval:    QRS Duration: 98 QT Interval:  397 QTC Calculation: 596 R Axis:   8 Text Interpretation: Junctional tachycardia Nonspecific repolarization abnormalities Prolonged QT interval Confirmed by Gerlene Fee [phone]) on 11/17/2019 4:43:11 PM      Labs Reviewed  CBC - Abnormal; Notable for the following components:      Result Value   WBC 13.2 (*)    RBC 3.07 (*)    Hemoglobin 10.6 (*)    HCT 34.7 (*)    MCV 113.0 (*)    MCH 34.5 (*)    RDW 18.5 (*)    nRBC 0.5 (*)    All other components within normal limits  COMPREHENSIVE METABOLIC PANEL - Abnormal; Notable for the following components:   Potassium 5.7 (*)    CO2 20 (*)    Glucose, Bld 160 (*)    BUN 73 (*)     Creatinine, Ser 12.55 (*)    GFR calc non Af Amer 3 (*)    GFR calc Af Amer 3 (*)    Anion gap 22 (*)    All other components within normal limits  LACTIC ACID, PLASMA - Abnormal; Notable for the following components:   Lactic Acid, Venous 3.4 (*)    All other components within normal limits  BRAIN NATRIURETIC PEPTIDE - Abnormal; Notable for the following components:   B Natriuretic Peptide >4,500.0 (*)    All other components within normal limits  LACTIC ACID, PLASMA - Abnormal; Notable for the following components:   Lactic Acid, Venous 3.5 (*)    All other components within normal limits  I-STAT CHEM 8, ED - Abnormal; Notable for the following components:   Potassium 5.7 (*)    BUN 87 (*)    Creatinine, Ser 13.20 (*)    Glucose, Bld 150 (*)    Calcium, Ion 1.04 (*)    Hemoglobin 11.2 (*)    HCT 33.0 (*)    All other components within normal limits  POCT I-STAT EG7 - Abnormal; Notable for the following components:   pCO2, Ven 42.9 (*)    pO2, Ven 117.0 (*)    Potassium 5.8 (*)    Calcium, Ion 1.05 (*)    HCT 33.0 (*)    Hemoglobin 11.2 (*)    All other components within normal limits  TROPONIN I (HIGH SENSITIVITY) - Abnormal; Notable for the following components:   Troponin I (High Sensitivity) 108 (*)    All other components within normal limits  TROPONIN I (HIGH SENSITIVITY) - Abnormal; Notable for the following components:   Troponin I (High Sensitivity) 126 (*)    All other components within normal limits  POC SARS CORONAVIRUS 2 AG -  ED    CTA Chest for PE  Final Result    DG Chest Portable 1 View  Final Result      Medications  calcium gluconate 1 g in sodium chloride 0.9 % 100 mL IVPB (0 g Intravenous Stopped 12/10/2019 1933)  aspirin chewable tablet 324 mg (324 mg Oral Given 11/20/2019 1754)  fentaNYL (SUBLIMAZE) injection 50 mcg (50 mcg Intravenous Given 12/03/2019 1755)  iohexol (OMNIPAQUE) 350 MG/ML injection 75 mL (75 mLs Intravenous Contrast Given 12/03/2019 1823)   metoprolol tartrate (LOPRESSOR) injection 5 mg (5 mg Intravenous Given 12/16/2019 1959)     Procedures  /  Critical Care .Critical Care Performed by: Maudie Flakes, MD Authorized by: Maudie Flakes, MD   Critical care provider statement:    Critical care time (minutes):  33   Critical care was necessary to treat or prevent imminent or life-threatening deterioration of the following conditions:  Respiratory failure   Critical care was time spent personally by me on the following activities:  Discussions with consultants, evaluation of patient's response to treatment, examination of patient, ordering and performing treatments and interventions, ordering and review of laboratory studies, ordering and review of radiographic studies, pulse oximetry, re-evaluation of patient's condition, obtaining history from patient or surrogate and review of old charts    ED Course and Medical Decision Making  I have reviewed the triage vital signs, the nursing notes, and pertinent available records from the EMR.  Pertinent labs & imaging results that were available during my care of the patient were reviewed by me and considered in my medical decision making (see below for details).     Favoring fluid overloaded state given the missed dialysis, awaiting chest x-ray.  Also considering ACS, PE, pneumonia, COVID-19.  5:20 PM update: Patient is persistently demonstrating a junctional tachycardia in the 130s.  Per chart review she has not done this on prior presentations of fluid overloaded state.  Her x-ray appears improved from prior.  She is having chest pain as well.  All of this is raising the concern for possible pulmonary embolism, will obtain CTA.  CTA negative, admitted to hospital service for further care.  Barth Kirks. Sedonia Small, [place] [email or web address]  Final Clinical Impressions(s) / ED Diagnoses     ICD-10-CM   1. Hypervolemia, unspecified  hypervolemia type  E87.70   2. Acute respiratory failure with hypoxia ([place])  J96.01     ED Discharge Orders    None       Discharge Instructions Discussed with and Provided to Patient:   Discharge Instructions   None       Maudie Flakes, MD 11/18/2019 2116

## 2019-11-26 NOTE — ED Triage Notes (Signed)
Pt BIB [facility] in respiratory distress. Pt received Duo-Neb, 2 of Mag, 125 solumedrol, and 1 nitro per EMS. Hx of CHF, COPD, missed dialysis on Friday1/8. Pt on 10 L NRB, 3L [place] at baseline. HR in the 130's, BP 160/97 (115).

## 2019-11-26 NOTE — H&P (Addendum)
History and Physical    Kari Robinson XBD:[id number] DOB: November 18, 1950 DOA: 11/23/2019  PCP: Kari Side., FNP  Patient coming from: Home  Chief Complaint: SOB  HPI: Kari Robinson is a 69 y.o. female with medical history significant of CAD, COPD (chronic hypoxemic respiratory failure 3L), CAD s/p PCI, Combined systolic and diastolic heart failure, RAS, HTN, HLD, ESRD on MWF HD, Depression and AAA who presents with shortness of breath.   Patient reports she was in [place] thru yesterday, had missed her HD session on Friday due to miscommunication with her transportation and noted she had severe shortness of breath throughout the day.  She felt as though she could not catch her breath and reports chest tightness and heaviness in her mid chest.  She also reports that she could not lay flat or sleep throughout the night and needed to sit up.  She states even now she has been feeling short of breath when at angle.  She does report she had improvement with SL nitroglycerin in the ER.  En route to the hospital she given steroids and nebs which minimally helped.  She states she is unsure if she could walk more than a few steps at the moment.  She chronically does use 3L of oxygen due to her COPD.  She denies any neck pain, nausea/vomiting, no arm pain, no radiation.  She simply notes some tingling to the digits of her right hand.  She denies any fevers, chills.  She reports she still makes urine and has been urinating throughout the day.  She denies chest pain, no inspiratory discomfort.   Former smoker and alcohol abuse, none present.  Denies any drugs   Review of Systems: As per HPI otherwise 10 point review of systems negative.    Past Medical History:  Diagnosis Date  . AAA (abdominal aortic aneurysm) ([place]) 11/12/2019   4.1 cm on [place] 11/12/19 Recheck 1 year  . Alcohol abuse   . Arthralgia   . CAD (coronary artery disease) 2006, 2016   stents when in [place]  . Chronic combined systolic  and diastolic HF (heart failure) ([place])   . COPD (chronic obstructive pulmonary disease) ([place])   . Depression   . Dyspnea   . ESRD (end stage renal disease) ([place])   . HTN (hypertension)   . Hypercholesterolemia    primarily ldl-p and small particles  . Lung cancer [facility])    Patient reports this with surgery.  No details.    [name] RAS (renal artery stenosis) ([place]) 2002   by cath tysinger  . Smoking     Past Surgical History:  Procedure Laterality Date  . abdominal aortogram     perclose of the right femoral artery  . AV FISTULA PLACEMENT Right 07/11/2018   Procedure: Right arm ARTERIOVENOUS FISTULA CREATION;  Surgeon: [name], MD;  Location: [facility];  Service: Vascular;  Laterality: Right;  . AVF placement Right   . CARDIAC CATHETERIZATION     left heart catheterization.  Coronary cineangiography. Lft ventricular cineangiography.    [name] LEFT HEART CATH AND CORONARY ANGIOGRAPHY N/A 11/01/2019   Procedure: LEFT HEART CATH AND CORONARY ANGIOGRAPHY;  Surgeon: Burnell Blanks, MD;  Location: [facility];  Service: Cardiovascular;  Laterality: N/A;  . LUNG SURGERY     due to lung cancer per patient's report  . TUBAL LIGATION       reports that she quit smoking about 6 months ago. Her smoking use included  cigarettes. She quit after 35.00 years of use. She has never used smokeless tobacco. She reports previous alcohol use. She reports that she does not use drugs.  Allergies  Allergen Reactions  . Citalopram Hydrobromide Hives  . Morphine And Related Itching  . Tape Other (See Comments)    PLASTIC, CLEAR TAPE STICKS TO THE SKIN AND POSSIBLY TEARS IT- PLEASE USE PAPER TAPE    Family History  Problem Relation Age of Onset  . Emphysema Father   . Cancer Brother      Prior to Admission medications   Medication Sig Start Date End Date Taking? Authorizing Provider  QUEtiapine (SEROQUEL) 200 MG tablet Take 1 tablet (200 mg total) by mouth at bedtime. 11/21/19  Yes  Medina-Vargas, Monina C, NP  albuterol (VENTOLIN HFA) 108 (90 Base) MCG/ACT inhaler Inhale 2 puffs into the lungs every 4 (four) hours as needed for wheezing or shortness of breath. 11/21/19   Medina-Vargas, Monina C, NP  aspirin EC 81 MG tablet Take 81 mg by mouth daily.    [provider]  atorvastatin (LIPITOR) 40 MG tablet Take 1 tablet (40 mg total) by mouth daily. 11/21/19   Medina-Vargas, Monina C, NP  AURYXIA 1 GM 210 MG(Fe) tablet Take 2-3 tablets (420-630 mg total) by mouth See admin instructions. Take 3 tablets (630 mg) by mouth M-W-F at bedtime. Give 3 tablets three times daily with meals Tue-Thu-Sat-Sun. 11/21/19   Medina-Vargas, Monina C, NP  b complex-vitamin c-folic acid (NEPHRO-VITE) 0.8 MG TABS tablet Take 1 tablet by mouth See admin instructions. Take 1 tablet by mouth in the morning on Sun/Tues/Thurs/Sat and 1 tablet after dialysis on Mon/Wed/Fri 02/06/19   [provider]  bisacodyl (DULCOLAX) 10 MG suppository Place 10 mg rectally as needed for moderate constipation.    [provider]  budesonide-formoterol (SYMBICORT) 160-4.5 MCG/ACT inhaler Inhale 2 puffs into the lungs 2 (two) times daily. 11/21/19   Medina-Vargas, Monina C, NP  buPROPion (WELLBUTRIN SR) 150 MG 12 hr tablet Take 1 tablet (150 mg total) by mouth 2 (two) times daily. 11/21/19   Medina-Vargas, Monina C, NP  clopidogrel (PLAVIX) 75 MG tablet Take 1 tablet (75 mg total) by mouth daily. 11/21/19   Medina-Vargas, Monina C, NP  lidocaine-prilocaine (EMLA) cream Apply 1 application topically every Monday, Wednesday, and Friday with hemodialysis. Prior to dialysis 11/22/19   Medina-Vargas, Monina C, NP  magnesium hydroxide (MILK OF MAGNESIA) 400 MG/5ML suspension Take 30 mLs by mouth daily as needed for mild constipation.    [provider]  metoprolol succinate (TOPROL-XL) 50 MG 24 hr tablet Take 1 tablet (50 mg total) by mouth daily. Take with or immediately following a meal. 11/21/19   Medina-Vargas,  Monina C, NP  nitroGLYCERIN (NITROSTAT) 0.4 MG SL tablet Place 1 tablet (0.4 mg total) under the tongue every 5 (five) minutes as needed for chest pain. 11/21/19   Medina-Vargas, Monina C, NP  Nutritional Supplements (FEEDING SUPPLEMENT, NEPRO CARB STEADY,) LIQD Take 237 mLs by mouth 2 (two) times daily between meals.    [provider]  OXYGEN Inhale 3 L/min into the lungs continuous.     [provider]  Sodium Phosphates (RA SALINE ENEMA) 19-7 GM/118ML ENEM Place 1 each rectally as needed (for constipation).    [provider]    Physical Exam: Vitals:   12/15/2019 1730 12/03/2019 1745 11/30/2019 1800 12/03/2019 1945  BP: (!) 157/94 (!) 160/93 (!) 155/96   Pulse: (!) 130 (!) 129 (!) 126 (!) 157  Resp: (!) 23 20 (!) 22 18  Temp:      TempSrc:      SpO2: 99% 97% 99% 100%  Weight:      Height:        Vitals:   12/15/2019 1730 12/10/2019 1745 12/03/2019 1800 12/11/2019 1945  BP: (!) 157/94 (!) 160/93 (!) 155/96   Pulse: (!) 130 (!) 129 (!) 126 (!) 157  Resp: (!) 23 20 (!) 22 18  Temp:      TempSrc:      SpO2: 99% 97% 99% 100%  Weight:      Height:         Constitutional: Slight distress, though seated upright and comfortable on 5L nasal cannula Eyes: PERRL, lids and conjunctivae normal ENMT: Mucous membranes are moist. Posterior pharynx clear of any exudate or lesions.Normal dentition.  Neck: normal, supple, no masses, no thyromegaly Respiratory: bilateral crackles, no wheezing appreciated at this time, no accessory muscle use Cardiovascular: irregular rate and rhythm, no murmurs / rubs / gallops. No extremity edema. 2+ pedal pulses. No carotid bruits.  Abdomen: no tenderness, no masses palpated. No hepatosplenomegaly. Bowel sounds positive.  Musculoskeletal: no clubbing / cyanosis. No joint deformity upper and lower extremities. Good ROM, no contractures. Normal muscle tone.  Skin: no rashes, lesions, ulcers. No induration Neurologic: CN 2-12 grossly intact. No  gross motor or sensory deficits appreciated Psychiatric: Normal judgment and insight. Alert and oriented x 3. Normal mood.     Labs on Admission: I have personally reviewed following labs and imaging studies  CBC: Recent Labs  Lab 11/19/2019 1639 12/04/2019 1655  WBC 13.2*  --   HGB 10.6* 11.2*  11.2*  HCT 34.7* 33.0*  33.0*  MCV 113.0*  --   PLT 248  --    Basic Metabolic Panel: Recent Labs  Lab 11/19/2019 1639 12/17/2019 1655  NA 140 138  138  K 5.7* 5.7*  5.8*  CL 98 103  CO2 20*  --   GLUCOSE 160* 150*  BUN 73* 87*  CREATININE 12.55* 13.20*  CALCIUM 9.3  --    GFR: Estimated Creatinine Clearance: 3.8 mL/min (A) (by C-G formula based on SCr of 13.2 mg/dL (H)). Liver Function Tests: Recent Labs  Lab 11/30/2019 1639  AST 28  ALT 14  ALKPHOS 106  BILITOT 0.7  PROT 6.8  ALBUMIN 3.8   No results for input(s): LIPASE, AMYLASE in the last 168 hours. No results for input(s): AMMONIA in the last 168 hours. Coagulation Profile: No results for input(s): INR, PROTIME in the last 168 hours. Cardiac Enzymes: No results for input(s): CKTOTAL, CKMB, CKMBINDEX, TROPONINI in the last 168 hours. BNP (last 3 results) No results for input(s): PROBNP in the last 8760 hours. HbA1C: No results for input(s): HGBA1C in the last 72 hours. CBG: No results for input(s): GLUCAP in the last 168 hours. Lipid Profile: No results for input(s): CHOL, HDL, LDLCALC, TRIG, CHOLHDL, LDLDIRECT in the last 72 hours. Thyroid Function Tests: No results for input(s): TSH, T4TOTAL, FREET4, T3FREE, THYROIDAB in the last 72 hours. Anemia Panel: No results for input(s): VITAMINB12, FOLATE, FERRITIN, TIBC, IRON, RETICCTPCT in the last 72 hours. Urine analysis:    Component Value Date/Time   COLORURINE STRAW (A) 02/25/2019 0735   APPEARANCEUR CLEAR 02/25/2019 0735   LABSPEC 1.004 (L) 02/25/2019 0735   PHURINE 9.0 (H) 02/25/2019 0735   GLUCOSEU NEGATIVE 02/25/2019 0735   HGBUR SMALL (A) 02/25/2019  [place] NEGATIVE 02/25/2019 0735  [place] NEGATIVE 02/25/2019 0735   PROTEINUR 100 (A) 02/25/2019 0735   NITRITE NEGATIVE 02/25/2019 0735   LEUKOCYTESUR NEGATIVE 02/25/2019 0735    Radiological Exams on Admission: CTA Chest for PE  Result Date: 12/04/2019 CLINICAL DATA:  Shortness of breath EXAM: CT ANGIOGRAPHY CHEST WITH CONTRAST TECHNIQUE: Multidetector CT imaging of the chest was performed using the standard protocol during bolus administration of intravenous contrast. Multiplanar CT image reconstructions and MIPs were obtained to evaluate the vascular anatomy. CONTRAST:  20mL OMNIPAQUE IOHEXOL 350 MG/ML SOLN COMPARISON:  CT chest 2019 FINDINGS: Cardiovascular: Satisfactory opacification of the pulmonary arteries to the segmental level. No evidence of pulmonary embolism. Stable mild enlargement of the main pulmonary artery. Mild cardiomegaly. Coronary artery calcification. No pericardial effusion. Calcified plaque along the included aorta and proximal great vessels. Ascending aorta measures up to 3.7 cm. Mediastinum/Nodes: No mediastinal, hilar, or axillary adenopathy. Lungs/Pleura: Volume loss in the left hemithorax with postsurgical changes of left upper lobectomy. Emphysema. Bibasilar atelectasis/scarring. Upper Abdomen: No acute abnormality. Musculoskeletal: Mild degenerative changes of the thoracic spine. Review of the MIP images confirms the above findings. IMPRESSION: No acute pulmonary embolism. Mild cardiomegaly. Mild enlargement of the main pulmonary artery suggesting pulmonary arterial hypertension. Aortic Atherosclerosis (ICD10-I70.0) and Emphysema (ICD10-J43.9). Electronically Signed   By: Macy Mis M.D.   On: 11/27/2019 18:42   DG Chest Portable 1 View  Result Date: 12/11/2019 CLINICAL DATA:  Shortness of breath EXAM: PORTABLE CHEST 1 VIEW COMPARISON:  November 11, 2019 FINDINGS: No pneumothorax. There is a small left effusion versus pleural thickening, similar  since November 11, 2019. Opacity is seen in the right lower lobe, similar since November 11, 2019. No change in the cardiomediastinal silhouette. IMPRESSION: 1. Infiltrate persists in the right base, similar since November 11, 2019. There is either a small left effusion or pleural thickening, also stable. Electronically Signed   By: Dorise Bullion III M.D   On: 11/23/2019 17:07    EKG: Independently reviewed, will repeat  Assessment/Plan Kari Robinson is a 69 y.o. female with medical history significant of CAD, COPD (chronic hypoxemic respiratory failure 3L), CAD s/p PCI, Combined systolic and diastolic heart failure, RAS, HTN, HLD, ESRD on MWF HD, Depression and AAA who presents with shortness of breath suspect largely due to CHF exacerbation / pulmonary edema in setting of missed HD.  # Acute on chronic hypoxemic respiratory failure # Acute on chronic combined systolic and diastolic heart failure - patient missed HD on Friday, with shortness of breath, reporting improvement in her symptoms with being seated upright and with SL nitroglycerin.  She is currently saturating appropriately on 5L of oxygen (baseline of 3L due to COPD). - given her symptoms of orthopnea and PND and BNP > 4500 (though has frequently been this high - recommend pre-d/c BNP) and missed HD, anticipate improvement with HD and anticipate lactate will improve as well - her response to nitroglycerin given reported pressure may indicate poorly controlled angina (which is also possible) but suspect venodilation is also assisting with pulmonary edema - CTA did not reveal PE, Echo ordered - Nephrology, Dr. Joelyn Oms, consulted for HD - will attempt single dose of IV Lasix, patient reports SOB and still making urine - monitor I/O closely as well as daily weights - monitor electrolytes, maintain K >4, Mg > 2 but cognizant of patient's ESRD status  # CAD s/p PCI # HTN # HLD - hx of recent LHC less than 1 month prior (12/16)  revealed diffuse non-obstructive disease  in the LAD, and otherwise no critical stenosis.  Mild RCA disease.  Moderate stenosis (50%) was noted beyond mid circumflex.  Highest grade lesion at the time was 1st septal lesion with 60% stenosis. Remainder 20-50%. - HS troponin will be trended but do not suspect ACS, and less so given LHC - however, she may require better anti-anginal management but would wait and re-assess after HD - consider cardiology consultation for potential medical optimization if symptoms persist despite HD - continue aspirin, plavix, atorvastatin  # COPD - at this time do not suspect acute exac is contributing to above, no infectious symptoms, no increased sputum production and less improvement with steroids / nebs than with nitro - continue home meds  # ESRD on MWF HD - Dr. Joelyn Oms, Nephrology, consulted for HD - Hgb > 10 - Hyperkalemia of 5.7, will re-check BMP - continue ferric citrate  # Elevated lactate - suspect from poor perfusion, due to volume overload - will improve with HD - trend  # Depression - continue bupropion (has not been on quetiapine due to increased dosage) - with prolonged QTc, held   DVT prophylaxis: SQH Code Status: Full Family Communication: She will notify her fiance Disposition Plan: pending Consults called: Nephrology, Dr. Joelyn Oms Admission status: cards tele   Truddie Hidden MD [facility] Pager [phone]  If 7PM-7AM, please contact night-coverage [email or web address] Password [facility]  12/04/2019, 7:54 PM

## 2019-11-27 ENCOUNTER — Inpatient Hospital Stay (HOSPITAL_COMMUNITY): Payer: Medicare Other

## 2019-11-27 DIAGNOSIS — J449 Chronic obstructive pulmonary disease, unspecified: Secondary | ICD-10-CM

## 2019-11-27 DIAGNOSIS — D638 Anemia in other chronic diseases classified elsewhere: Secondary | ICD-10-CM

## 2019-11-27 DIAGNOSIS — I361 Nonrheumatic tricuspid (valve) insufficiency: Secondary | ICD-10-CM

## 2019-11-27 DIAGNOSIS — I351 Nonrheumatic aortic (valve) insufficiency: Secondary | ICD-10-CM

## 2019-11-27 LAB — CBC
HCT: 32.3 % — ABNORMAL LOW (ref 36.0–46.0)
HCT: 28.9 % — ABNORMAL LOW (ref 36.0–46.0)
Hemoglobin: 10 g/dL — ABNORMAL LOW (ref 12.0–15.0)
Hemoglobin: 8.7 g/dL — ABNORMAL LOW (ref 12.0–15.0)
MCH: 34.1 pg — ABNORMAL HIGH (ref 26.0–34.0)
MCH: 34.9 pg — ABNORMAL HIGH (ref 26.0–34.0)
MCHC: 31 g/dL (ref 30.0–36.0)
MCHC: 30.1 g/dL (ref 30.0–36.0)
MCV: 110.2 fL — ABNORMAL HIGH (ref 80.0–100.0)
MCV: 116.1 fL — ABNORMAL HIGH (ref 80.0–100.0)
Platelets: 201 10*3/uL (ref 150–400)
Platelets: 190 10*3/uL (ref 150–400)
RBC: 2.93 MIL/uL — ABNORMAL LOW (ref 3.87–5.11)
RBC: 2.49 MIL/uL — ABNORMAL LOW (ref 3.87–5.11)
RDW: 18.5 % — ABNORMAL HIGH (ref 11.5–15.5)
RDW: 18.7 % — ABNORMAL HIGH (ref 11.5–15.5)
WBC: 6.1 10*3/uL (ref 4.0–10.5)
WBC: 7.7 10*3/uL (ref 4.0–10.5)
nRBC: 0.7 % — ABNORMAL HIGH (ref 0.0–0.2)
nRBC: 0.4 % — ABNORMAL HIGH (ref 0.0–0.2)

## 2019-11-27 LAB — PHOSPHORUS: Phosphorus: 7.4 mg/dL — ABNORMAL HIGH (ref 2.5–4.6)

## 2019-11-27 LAB — BASIC METABOLIC PANEL
Anion gap: 21 — ABNORMAL HIGH (ref 5–15)
Anion gap: 23 — ABNORMAL HIGH (ref 5–15)
BUN: 77 mg/dL — ABNORMAL HIGH (ref 8–23)
BUN: 78 mg/dL — ABNORMAL HIGH (ref 8–23)
CO2: 17 mmol/L — ABNORMAL LOW (ref 22–32)
CO2: 19 mmol/L — ABNORMAL LOW (ref 22–32)
Calcium: 9.7 mg/dL (ref 8.9–10.3)
Calcium: 9.7 mg/dL (ref 8.9–10.3)
Chloride: 96 mmol/L — ABNORMAL LOW (ref 98–111)
Chloride: 99 mmol/L (ref 98–111)
Creatinine, Ser: 12.34 mg/dL — ABNORMAL HIGH (ref 0.44–1.00)
Creatinine, Ser: 13.09 mg/dL — ABNORMAL HIGH (ref 0.44–1.00)
GFR calc Af Amer: 3 mL/min — ABNORMAL LOW (ref >60)
GFR calc Af Amer: 3 mL/min — ABNORMAL LOW (ref >60)
GFR calc non Af Amer: 3 mL/min — ABNORMAL LOW (ref >60)
GFR calc non Af Amer: 3 mL/min — ABNORMAL LOW (ref >60)
Glucose, Bld: 142 mg/dL — ABNORMAL HIGH (ref 70–99)
Glucose, Bld: 154 mg/dL — ABNORMAL HIGH (ref 70–99)
Potassium: 5.4 mmol/L — ABNORMAL HIGH (ref 3.5–5.1)
Potassium: 6.6 mmol/L (ref 3.5–5.1)
Sodium: 136 mmol/L (ref 135–145)
Sodium: 139 mmol/L (ref 135–145)

## 2019-11-27 LAB — MAGNESIUM: Magnesium: 3 mg/dL — ABNORMAL HIGH (ref 1.7–2.4)

## 2019-11-27 LAB — ECHOCARDIOGRAM COMPLETE
Height: 62 in
Weight: 2656 oz

## 2019-11-27 LAB — TROPONIN I (HIGH SENSITIVITY)
Troponin I (High Sensitivity): 113 ng/L (ref <18)
Troponin I (High Sensitivity): 137 ng/L (ref <18)

## 2019-11-27 LAB — LACTIC ACID, PLASMA: Lactic Acid, Venous: 3.5 mmol/L (ref 0.5–1.9)

## 2019-11-27 MED ORDER — SODIUM ZIRCONIUM CYCLOSILICATE 10 G PO PACK
10.0000 g | PACK | Freq: Once | ORAL | Status: AC
  Administered 2019-11-27: 05:00:00 10 g via ORAL
  Filled 2019-11-27: qty 1

## 2019-11-27 MED ORDER — SODIUM CHLORIDE 0.9 % IV SOLN: 100.0000 mL | INTRAVENOUS | Status: DC | PRN

## 2019-11-27 MED ORDER — LORAZEPAM 2 MG/ML IJ SOLN
1.0000 mg | Freq: Four times a day (QID) | INTRAMUSCULAR | Status: DC | PRN
  Administered 2019-11-27 (×2): 1 mg via INTRAVENOUS
  Filled 2019-11-27 (×2): qty 1

## 2019-11-27 MED ORDER — DEXTROSE 50 % IV SOLN
1.0000 | Freq: Once | INTRAVENOUS | Status: AC
  Administered 2019-11-27: 03:00:00 50 mL via INTRAVENOUS
  Filled 2019-11-27: qty 50

## 2019-11-27 MED ORDER — HEPARIN SODIUM (PORCINE) 1000 UNIT/ML DIALYSIS
1000.0000 [IU] | INTRAMUSCULAR | Status: DC | PRN
  Filled 2019-11-27 (×2): qty 1

## 2019-11-27 MED ORDER — CALCIUM GLUCONATE-NACL 1-0.675 GM/50ML-% IV SOLN
1.0000 g | Freq: Once | INTRAVENOUS | Status: AC
  Administered 2019-11-27: 03:00:00 1000 mg via INTRAVENOUS
  Filled 2019-11-27: qty 50

## 2019-11-27 MED ORDER — LIDOCAINE HCL (PF) 1 % IJ SOLN: 5.0000 mL | INTRAMUSCULAR | Status: DC | PRN

## 2019-11-27 MED ORDER — LIDOCAINE-PRILOCAINE 2.5-2.5 % EX CREA: 1.0000 "application " | TOPICAL_CREAM | CUTANEOUS | Status: DC | PRN

## 2019-11-27 MED ORDER — HEPARIN SODIUM (PORCINE) 1000 UNIT/ML DIALYSIS: 1000.0000 [IU] | INTRAMUSCULAR | Status: DC | PRN

## 2019-11-27 MED ORDER — PENTAFLUOROPROP-TETRAFLUOROETH EX AERO: 1.0000 "application " | INHALATION_SPRAY | CUTANEOUS | Status: DC | PRN

## 2019-11-27 MED ORDER — LIDOCAINE-PRILOCAINE 2.5-2.5 % EX CREA
1.0000 "application " | TOPICAL_CREAM | CUTANEOUS | Status: DC | PRN
  Filled 2019-11-27: qty 5

## 2019-11-27 MED ORDER — ALTEPLASE 2 MG IJ SOLR: 2.0000 mg | Freq: Once | INTRAMUSCULAR | Status: DC | PRN

## 2019-11-27 MED ORDER — INSULIN ASPART 100 UNIT/ML IV SOLN
5.0000 [IU] | Freq: Once | INTRAVENOUS | Status: AC
  Administered 2019-11-27: 03:00:00 5 [IU] via INTRAVENOUS

## 2019-11-27 MED ORDER — SODIUM BICARBONATE 8.4 % IV SOLN
50.0000 meq | Freq: Once | INTRAVENOUS | Status: AC
  Administered 2019-11-27: 03:00:00 50 meq via INTRAVENOUS
  Filled 2019-11-27: qty 50

## 2019-11-27 MED ORDER — FENTANYL CITRATE (PF) 100 MCG/2ML IJ SOLN
12.5000 ug | Freq: Once | INTRAMUSCULAR | Status: AC
  Administered 2019-11-27: 06:00:00 12.5 ug via INTRAVENOUS
  Filled 2019-11-27: qty 2

## 2019-11-27 MED ORDER — FERRIC CITRATE 1 GM 210 MG(FE) PO TABS
630.0000 mg | ORAL_TABLET | ORAL | Status: DC
  Filled 2019-11-27 (×2): qty 3

## 2019-11-27 MED ORDER — CHLORHEXIDINE GLUCONATE CLOTH 2 % EX PADS: 6.0000 | MEDICATED_PAD | Freq: Every day | CUTANEOUS | Status: DC

## 2019-11-27 MED ORDER — HEPARIN SODIUM (PORCINE) 1000 UNIT/ML DIALYSIS
20.0000 [IU]/kg | INTRAMUSCULAR | Status: DC | PRN
  Filled 2019-11-27: qty 2

## 2019-11-27 MED ORDER — TRAZODONE HCL 50 MG PO TABS: 50.0000 mg | ORAL_TABLET | Freq: Every day | ORAL | Status: DC

## 2019-11-27 MED ORDER — ALBUTEROL SULFATE (2.5 MG/3ML) 0.083% IN NEBU
10.0000 mg | INHALATION_SOLUTION | Freq: Once | RESPIRATORY_TRACT | Status: AC
  Administered 2019-11-27: 10 mg via RESPIRATORY_TRACT
  Filled 2019-11-27: qty 12

## 2019-11-27 NOTE — Evaluation (Signed)
Physical Therapy Evaluation Patient Details Name: Kari Robinson MRN: [id number] DOB: February 24, 1951 Today's Date: 11/27/2019   History of Present Illness  Kari Robinson is a 69 y.o. female with medical history significant of CAD, COPD (chronic hypoxemic respiratory failure 3L), CAD s/p PCI, Combined systolic and diastolic heart failure, RAS, HTN, HLD, ESRD on MWF HD, Depression and AAA. Patient presented secondary to shortness of breath with concern for possible heart failure exacerbation/fluid overload with missed dialysis.  Clinical Impression  Patient presents with decreased activity tolerance with HR into 120's with OOB to BSC on 100% NRB.  Currently min A with RW.  Feel she is likely not far from her baseline as significant other was helping her with all mobility and ADL's at home.  Hopeful for d/c home when stable with resumption of HHPT services and likely HHaide.  PT to follow acutely.     Follow Up Recommendations Home health PT;Supervision/Assistance - 24 hour(HH aide)    Equipment Recommendations  None recommended by PT    Recommendations for Other Services       Precautions / Restrictions Precautions Precautions: Fall Precaution Comments: reports falling at home      Mobility  Bed Mobility Overal bed mobility: Needs Assistance Bed Mobility: Supine to Sit;Sit to Supine Rolling: Min assist   Supine to sit: HOB elevated Sit to supine: Supervision   General bed mobility comments: on 100% NRB mask in ED, assisted up EOB assisting for lines, to supine unaided  Transfers Overall transfer level: Needs assistance Equipment used: Rolling walker (2 wheeled) Transfers: Sit to/from [facility] Sit to Stand: Min assist Stand pivot transfers: Min assist       General transfer comment: used RW for up to [facility], no episodes of knees buckling, but has history of this  Ambulation/Gait             General Gait Details: NT, on NRB mask  Stairs             Wheelchair Mobility    Modified Rankin (Stroke Patients Only)       Balance Overall balance assessment: Needs assistance Sitting-balance support: Feet supported Sitting balance-Leahy Scale: Good     Standing balance support: Bilateral upper extremity supported Standing balance-Leahy Scale: Poor Standing balance comment: UE support needed for balance                             Pertinent Vitals/Pain Pain Assessment: No/denies pain    Home Living Family/patient expects to be discharged to:: Private residence Living Arrangements: Spouse/significant other Available Help at Discharge: Friend(s);Available 24 hours/day Type of Home: Apartment Home Access: Level entry     Home Layout: One level Home Equipment: Clinical cytogeneticist - 4 wheels;Grab bars - tub/shower;Toilet riser;Wheelchair - manual Additional Comments: reports boyfriend quit his job to take care of her    Prior Function Level of Independence: Needs assistance   Gait / Transfers Assistance Needed: assist for all mobility, using w/c mainly for transport to dialysis           Hand Dominance        Extremity/Trunk Assessment   Upper Extremity Assessment Upper Extremity Assessment: Generalized weakness    Lower Extremity Assessment Lower Extremity Assessment: RLE deficits/detail;LLE deficits/detail RLE Deficits / Details: AROM WFL, strength at least 4-/5 throughout, some intermittent tremor, but does not completely drop RLE Coordination: decreased gross motor;decreased fine motor LLE Deficits / Details: AROM WFL, strength  at least 4-/5 throughout, some intermittent tremor, but does not completely drop LLE Coordination: decreased gross motor;decreased fine motor       Communication   Communication: No difficulties  Cognition Arousal/Alertness: Awake/alert Behavior During Therapy: WFL for tasks assessed/performed Overall Cognitive Status: Within Functional Limits for tasks assessed                                         General Comments General comments (skin integrity, edema, etc.): HR up to 125 with mobility up to [facility] with SpO2 96% on 100% NRB    Exercises     Assessment/Plan    PT Assessment Patient needs continued PT services  PT Problem List Decreased activity tolerance;Decreased mobility;Decreased safety awareness;Decreased balance;Decreased coordination       PT Treatment Interventions DME instruction;Therapeutic activities;Gait training;Functional mobility training;Therapeutic exercise;Neuromuscular re-education;Patient/family education    PT Goals (Current goals can be found in the Care Plan section)  Acute Rehab PT Goals Patient Stated Goal: to go home with assist of significant other PT Goal Formulation: With patient Time For Goal Achievement: 12/11/19 Potential to Achieve Goals: Good    Frequency Min 3X/week   Barriers to discharge        Co-evaluation               AM-PAC PT "6 Clicks" Mobility  Outcome Measure Help needed turning from your back to your side while in a flat bed without using bedrails?: None Help needed moving from lying on your back to sitting on the side of a flat bed without using bedrails?: A Little Help needed moving to and from a bed to a chair (including a wheelchair)?: A Little Help needed standing up from a chair using your arms (e.g., wheelchair or bedside chair)?: A Little Help needed to walk in hospital room?: A Little Help needed climbing 3-5 steps with a railing? : A Lot 6 Click Score: 18    End of Session Equipment Utilized During Treatment: Oxygen Activity Tolerance: Patient limited by fatigue(tachycardic) Patient left: in bed;with call bell/phone within reach   PT Visit Diagnosis: Difficulty in walking, not elsewhere classified (R26.2);Other abnormalities of gait and mobility (R26.89);Repeated falls (R29.6)    Time: 4193-7902 PT Time Calculation (min) (ACUTE ONLY): 23  min   Charges:   PT Evaluation $PT Eval Moderate Complexity: 1 Mod PT Treatments $Therapeutic Activity: 8-22 mins        Magda Kiel, [place] [facility] [phone] 11/27/2019   Reginia Naas 11/27/2019, 5:20 PM

## 2019-11-27 NOTE — Progress Notes (Addendum)
ABG tried x 3  PT is unable to control her arm and needle keeps pulling  out  MD has been notified

## 2019-11-27 NOTE — ED Notes (Signed)
Pain med given 

## 2019-11-27 NOTE — ED Notes (Signed)
Chest oain is gone at present  Still having sob

## 2019-11-27 NOTE — ED Notes (Signed)
Py c/o increasing sob and chest pain  Nitro number 2 given to the pt

## 2019-11-27 NOTE — ED Notes (Signed)
Contacted patient's son at her request, gave him update. The patient's son [name], number in chart) would like to know when she gets a bed upstairs. Also requested that this concern be passed on: he is concerned that his mother has been unable to be dialized at her dialysis center regularly and would like that to be followed up in the hospital.

## 2019-11-27 NOTE — ED Notes (Signed)
covid swab colloested earlier the order still shows  Not needed

## 2019-11-27 NOTE — ED Notes (Signed)
Still c/o sob  Non-rebreather  Placed  resp theraoy came in and looked at her

## 2019-11-27 NOTE — Progress Notes (Signed)
Echocardiogram 2D Echocardiogram has been performed.  [place] Deputy Joely Losier 11/27/2019, 9:01 AM

## 2019-11-27 NOTE — ED Notes (Signed)
Pt having more difficulty breathing and the chest pain has returned.  On call doctor called back will order  To give morphine and get the rounding team to check her this am

## 2019-11-27 NOTE — ED Notes (Signed)
C/o chest pain

## 2019-11-27 NOTE — ED Notes (Signed)
ED TO INPATIENT HANDOFF REPORT  ED Nurse Name and Phone #:   S Name/Age/Gender Kari Robinson 69 y.o. female Room/Bed: 003C/003C  Code Status   Code Status: Full Code  Home/SNF/Other Home Patient oriented to: self, place and situation Is this baseline? Yes   Triage Complete: Triage complete  Chief Complaint Acute hypoxemic respiratory failure ([place]) [J96.01]  Triage Note Pt BIB [facility] in respiratory distress. Pt received Duo-Neb, 2 of Mag, 125 solumedrol, and 1 nitro per EMS. Hx of CHF, COPD, missed dialysis on Friday1/8. Pt on 10 L NRB, 3L [place] at baseline. HR in the 130's, BP 160/97 (115).    Allergies Allergies  Allergen Reactions  . Citalopram Hydrobromide Hives  . Morphine And Related Itching  . Tape Other (See Comments)    PLASTIC, CLEAR TAPE STICKS TO THE SKIN AND POSSIBLY TEARS IT- PLEASE USE PAPER TAPE    Level of Care/Admitting Diagnosis ED Disposition    ED Disposition Condition Comment   Admit  Hospital Area: [place] [[id number]]  Level of Care: Telemetry Cardiac [[id number]]  Covid Evaluation: Asymptomatic Screening Protocol (No Symptoms)  Diagnosis: Acute hypoxemic respiratory failure [facility]) [[id number]]  Admitting Physician: Truddie Hidden [[id number]]  Attending Physician: Truddie Hidden [[id number]]  Estimated length of stay: past midnight tomorrow  Certification:: I certify this patient will need inpatient services for at least 2 midnights       B Medical/Surgery History Past Medical History:  Diagnosis Date  . AAA (abdominal aortic aneurysm) ([place]) 11/12/2019   4.1 cm on [place] 11/12/19 Recheck 1 year  . Alcohol abuse   . Arthralgia   . CAD (coronary artery disease) 2006, 2016   stents when in [place]  . Chronic combined systolic and diastolic HF (heart failure) ([place])   . COPD (chronic obstructive pulmonary disease) ([place])   . Depression   . Dyspnea   . ESRD (end stage renal disease) ([place])   . HTN (hypertension)   . Hypercholesterolemia     primarily ldl-p and small particles  . Lung cancer [facility])    Patient reports this with surgery.  No details.    [name] RAS (renal artery stenosis) ([place]) 2002   by cath tysinger  . Smoking    Past Surgical History:  Procedure Laterality Date  . abdominal aortogram     perclose of the right femoral artery  . AV FISTULA PLACEMENT Right 07/11/2018   Procedure: Right arm ARTERIOVENOUS FISTULA CREATION;  Surgeon: [name], MD;  Location: [place];  Service: Vascular;  Laterality: Right;  . AVF placement Right   . CARDIAC CATHETERIZATION     left heart catheterization.  Coronary cineangiography. Lft ventricular cineangiography.    [name] LEFT HEART CATH AND CORONARY ANGIOGRAPHY N/A 11/01/2019   Procedure: LEFT HEART CATH AND CORONARY ANGIOGRAPHY;  Surgeon: Burnell Blanks, MD;  Location: [place] [facility];  Service: Cardiovascular;  Laterality: N/A;  . LUNG SURGERY     due to lung cancer per patient's report  . TUBAL LIGATION       A IV Location/Drains/Wounds Patient Lines/Drains/Airways Status   Active Line/Drains/Airways    Name:   Placement date:   Placement time:   Site:   Days:   Peripheral IV 11/25/2019 Left Antecubital   12/16/2019    --    Antecubital   1   Fistula / Graft Right Forearm Arteriovenous fistula   --    --    Forearm      External Urinary Catheter  11/11/19    2300    --   16          Intake/Output Last 24 hours  Intake/Output Summary (Last 24 hours) at 11/27/2019 1800 Last data filed at 12/15/2019 1933 Gross per 24 hour  Intake 100 ml  Output --  Net 100 ml    Labs/Imaging Results for orders placed or performed during the hospital encounter of 11/30/2019 (from the past 48 hour(s))  CBC     Status: Abnormal   Collection Time: 12/08/2019  4:39 PM  Result Value Ref Range   WBC 13.2 (H) 4.0 - 10.5 K/uL   RBC 3.07 (L) 3.87 - 5.11 MIL/uL   Hemoglobin 10.6 (L) 12.0 - 15.0 g/dL   HCT 34.7 (L) 36.0 - 46.0 %   MCV 113.0 (H) 80.0 - 100.0 fL   MCH 34.5 (H) 26.0  - 34.0 pg   MCHC 30.5 30.0 - 36.0 g/dL   RDW 18.5 (H) 11.5 - 15.5 %   Platelets 248 150 - 400 K/uL   nRBC 0.5 (H) 0.0 - 0.2 %    Comment: Performed at [facility], [address] [place], [place] [address]  CMP     Status: Abnormal   Collection Time: 11/20/2019  4:39 PM  Result Value Ref Range   Sodium 140 135 - 145 mmol/L   Potassium 5.7 (H) 3.5 - 5.1 mmol/L   Chloride 98 98 - 111 mmol/L   CO2 20 (L) 22 - 32 mmol/L   Glucose, Bld 160 (H) 70 - 99 mg/dL   BUN 73 (H) 8 - 23 mg/dL   Creatinine, Ser 12.55 (H) 0.44 - 1.00 mg/dL   Calcium 9.3 8.9 - 10.3 mg/dL   Total Protein 6.8 6.5 - 8.1 g/dL   Albumin 3.8 3.5 - 5.0 g/dL   AST 28 15 - 41 U/L   ALT 14 0 - 44 U/L   Alkaline Phosphatase 106 38 - 126 U/L   Total Bilirubin 0.7 0.3 - 1.2 mg/dL   GFR calc non Af Amer 3 (L) >60 mL/min   GFR calc Af Amer 3 (L) >60 mL/min   Anion gap 22 (H) 5 - 15    Comment: Performed at [facility], [place] [address], [place], [place] [address]  Lactic acid     Status: Abnormal   Collection Time: 12/11/2019  4:39 PM  Result Value Ref Range   Lactic Acid, Venous 3.4 (HH) 0.5 - 1.9 mmol/L    Comment: CRITICAL RESULT CALLED TO, READ BACK BY AND VERIFIED WITH: G.COPP RN @ 1744 12/10/2019 BY C.EDENS Performed at [facility], [place] [address] [place], [place] [address]   Troponin     Status: Abnormal   Collection Time: 12/01/2019  4:39 PM  Result Value Ref Range   Troponin I (High Sensitivity) 108 (HH) <18 ng/L    Comment: CRITICAL RESULT CALLED TO, READ BACK BY AND VERIFIED WITH: G.COPP RN @ 1744 12/16/2019 BY C.EDENS (NOTE) Elevated high sensitivity troponin I (hsTnI) values and significant  changes across serial measurements may suggest ACS but many other  chronic and acute conditions are known to elevate hsTnI results.  Refer to the Links section for chest pain algorithms and additional  guidance. Performed at [facility], [place] [address], [place], [place] [address]   Brain  natriuretic peptide     Status: Abnormal   Collection Time: 12/05/2019  4:39 PM  Result Value Ref Range   B  Natriuretic Peptide >4,500.0 (H) 0.0 - 100.0 pg/mL    Comment: Performed at [facility], [place] [address], [place], [place] [address]  I-stat chem 8, ED (not at [facility] or [facility])     Status: Abnormal   Collection Time: 12/03/2019  4:55 PM  Result Value Ref Range   Sodium 138 135 - 145 mmol/L   Potassium 5.7 (H) 3.5 - 5.1 mmol/L   Chloride 103 98 - 111 mmol/L   BUN 87 (H) 8 - 23 mg/dL   Creatinine, Ser 13.20 (H) 0.44 - 1.00 mg/dL   Glucose, Bld 150 (H) 70 - 99 mg/dL   Calcium, Ion 1.04 (L) 1.15 - 1.40 mmol/L   TCO2 24 22 - 32 mmol/L   Hemoglobin 11.2 (L) 12.0 - 15.0 g/dL   HCT 33.0 (L) 36.0 - 46.0 %  POCT I-Stat EG7     Status: Abnormal   Collection Time: 12/08/2019  4:55 PM  Result Value Ref Range   pH, Ven 7.348 7.250 - 7.430   pCO2, Ven 42.9 (L) 44.0 - 60.0 mmHg   pO2, Ven 117.0 (H) 32.0 - 45.0 mmHg   Bicarbonate 23.6 20.0 - 28.0 mmol/L   TCO2 25 22 - 32 mmol/L   O2 Saturation 98.0 %   Acid-base deficit 2.0 0.0 - 2.0 mmol/L   Sodium 138 135 - 145 mmol/L   Potassium 5.8 (H) 3.5 - 5.1 mmol/L   Calcium, Ion 1.05 (L) 1.15 - 1.40 mmol/L   HCT 33.0 (L) 36.0 - 46.0 %   Hemoglobin 11.2 (L) 12.0 - 15.0 g/dL   Patient temperature HIDE    Sample type VENOUS   POC SARS Coronavirus 2 Ag-ED - Nasal Swab (BD Veritor Kit)     Status: None   Collection Time: 12/14/2019  6:04 PM  Result Value Ref Range   SARS Coronavirus 2 Ag NEGATIVE NEGATIVE    Comment: (NOTE) SARS-CoV-2 antigen NOT DETECTED.  Negative results are presumptive.  Negative results do not preclude SARS-CoV-2 infection and should not be used as the sole basis for treatment or other patient management decisions, including infection  control decisions, particularly in the presence of clinical signs and  symptoms consistent with COVID-19, or in those who have been in contact with the virus.  Negative results must be combined  with clinical observations, patient history, and epidemiological information. The expected result is Negative. Fact Sheet for Patients: PodPark.tn Fact Sheet for Healthcare Providers: GiftContent.is This test is not yet approved or cleared by the [place] FDA and  has been authorized for detection and/or diagnosis of SARS-CoV-2 by FDA under an Emergency Use Authorization (EUA).  This EUA will remain in effect (meaning this test can be used) for the duration of  the COVID-19 de claration under Section 564(b)(1) of the Act, 21 U.S.C. section 360bbb-3(b)(1), unless the authorization is terminated or revoked sooner.   Troponin     Status: Abnormal   Collection Time: 11/24/2019  7:57 PM  Result Value Ref Range   Troponin I (High Sensitivity) 126 (HH) <18 ng/L    Comment: CRITICAL VALUE NOTED.  VALUE IS CONSISTENT WITH PREVIOUSLY REPORTED AND CALLED VALUE. (NOTE) Elevated high sensitivity troponin I (hsTnI) values and significant  changes across serial measurements may suggest ACS but many other  chronic and acute conditions are known to elevate hsTnI results.  Refer to the Links section for chest pain algorithms and additional  guidance. Performed at [facility], [place] [address] [place], [place] [address]  Lactic acid, plasma     Status: Abnormal   Collection Time: 12/12/2019  7:57 PM  Result Value Ref Range   Lactic Acid, Venous 3.5 (HH) 0.5 - 1.9 mmol/L    Comment: CRITICAL VALUE NOTED.  VALUE IS CONSISTENT WITH PREVIOUSLY REPORTED AND CALLED VALUE. Performed at [facility], [place] [address], [place], [place] [address]   CBC     Status: Abnormal   Collection Time: 12/14/2019 11:13 PM  Result Value Ref Range   WBC 6.1 4.0 - 10.5 K/uL   RBC 2.93 (L) 3.87 - 5.11 MIL/uL   Hemoglobin 10.0 (L) 12.0 - 15.0 g/dL   HCT 32.3 (L) 36.0 - 46.0 %   MCV 110.2 (H) 80.0 - 100.0 fL   MCH 34.1 (H) 26.0 - 34.0 pg   MCHC 31.0  30.0 - 36.0 g/dL   RDW 18.5 (H) 11.5 - 15.5 %   Platelets 201 150 - 400 K/uL   nRBC 0.7 (H) 0.0 - 0.2 %    Comment: Performed at [facility], [place] [address], [place], [place] [address]  Basic metabolic panel     Status: Abnormal   Collection Time: 12/04/2019 11:36 PM  Result Value Ref Range   Sodium 136 135 - 145 mmol/L   Potassium 6.6 (HH) 3.5 - 5.1 mmol/L    Comment: CRITICAL RESULT CALLED TO, READ BACK BY AND VERIFIED WITH: A CAIN,RN 11/27/2019 0043 [place] NO VISIBLE HEMOLYSIS    Chloride 96 (L) 98 - 111 mmol/L   CO2 19 (L) 22 - 32 mmol/L   Glucose, Bld 154 (H) 70 - 99 mg/dL   BUN 77 (H) 8 - 23 mg/dL   Creatinine, Ser 13.09 (H) 0.44 - 1.00 mg/dL   Calcium 9.7 8.9 - 10.3 mg/dL   GFR calc non Af Amer 3 (L) >60 mL/min   GFR calc Af Amer 3 (L) >60 mL/min   Anion gap 21 (H) 5 - 15    Comment: Performed at [place] [address], [place], [place] [address]  Magnesium     Status: Abnormal   Collection Time: 12/04/2019 11:36 PM  Result Value Ref Range   Magnesium 3.0 (H) 1.7 - 2.4 mg/dL    Comment: Performed at [place] [address] [place], [place] [address]  Troponin I (High Sensitivity)     Status: Abnormal   Collection Time: 11/30/2019 11:36 PM  Result Value Ref Range   Troponin I (High Sensitivity) 137 (HH) <18 ng/L    Comment: CRITICAL RESULT CALLED TO, READ BACK BY AND VERIFIED WITH: A CAIN,RN 11/27/2019 0043 [place]  (NOTE) Elevated high sensitivity troponin I (hsTnI) values and significant  changes across serial measurements may suggest ACS but many other  chronic and acute conditions are known to elevate hsTnI results.  Refer to the Links section for chest pain algorithms and additional  guidance. Performed at [facility], [place] [address] [place], [place] [address]   Lactic acid, plasma     Status: Abnormal   Collection Time: 12/03/2019 11:40 PM  Result Value Ref Range   Lactic Acid, Venous 3.5 (HH) 0.5 - 1.9 mmol/L    Comment:  CRITICAL VALUE NOTED.  VALUE IS CONSISTENT WITH PREVIOUSLY REPORTED AND CALLED VALUE. Performed at [facility], [place] [address], [place], [place] [address]   Basic metabolic panel     Status: Abnormal   Collection Time: 11/27/19  4:29 AM  Result Value Ref Range  Sodium 139 135 - 145 mmol/L   Potassium 5.4 (H) 3.5 - 5.1 mmol/L    Comment: NO VISIBLE HEMOLYSIS   Chloride 99 98 - 111 mmol/L   CO2 17 (L) 22 - 32 mmol/L   Glucose, Bld 142 (H) 70 - 99 mg/dL   BUN 78 (H) 8 - 23 mg/dL   Creatinine, Ser 12.34 (H) 0.44 - 1.00 mg/dL   Calcium 9.7 8.9 - 10.3 mg/dL   GFR calc non Af Amer 3 (L) >60 mL/min   GFR calc Af Amer 3 (L) >60 mL/min   Anion gap 23 (H) 5 - 15    Comment: Performed at [place] [address], [place], [place] [address]  CBC     Status: Abnormal   Collection Time: 11/27/19  4:29 AM  Result Value Ref Range   WBC 7.7 4.0 - 10.5 K/uL   RBC 2.49 (L) 3.87 - 5.11 MIL/uL   Hemoglobin 8.7 (L) 12.0 - 15.0 g/dL   HCT 28.9 (L) 36.0 - 46.0 %   MCV 116.1 (H) 80.0 - 100.0 fL   MCH 34.9 (H) 26.0 - 34.0 pg   MCHC 30.1 30.0 - 36.0 g/dL   RDW 18.7 (H) 11.5 - 15.5 %   Platelets 190 150 - 400 K/uL   nRBC 0.4 (H) 0.0 - 0.2 %    Comment: Performed at [facility], [place] [address], [place], [place] [address]  Phosphorus     Status: Abnormal   Collection Time: 11/27/19  4:29 AM  Result Value Ref Range   Phosphorus 7.4 (H) 2.5 - 4.6 mg/dL    Comment: Performed at [place] [address], [place], [place] [address]  Troponin I (High Sensitivity)     Status: Abnormal   Collection Time: 11/27/19  4:29 AM  Result Value Ref Range   Troponin I (High Sensitivity) 113 (HH) <18 ng/L    Comment: CRITICAL VALUE NOTED.  VALUE IS CONSISTENT WITH PREVIOUSLY REPORTED AND CALLED VALUE. (NOTE) Elevated high sensitivity troponin I (hsTnI) values and significant  changes across serial measurements may suggest ACS but many other  chronic and acute conditions are known to  elevate hsTnI results.  Refer to the Links section for chest pain algorithms and additional  guidance. Performed at [facility], [place] [address] [place], [place] [address]    CTA Chest for PE  Result Date: 12/17/2019 CLINICAL DATA:  Shortness of breath EXAM: CT ANGIOGRAPHY CHEST WITH CONTRAST TECHNIQUE: Multidetector CT imaging of the chest was performed using the standard protocol during bolus administration of intravenous contrast. Multiplanar CT image reconstructions and MIPs were obtained to evaluate the vascular anatomy. CONTRAST:  59m OMNIPAQUE IOHEXOL 350 MG/ML SOLN COMPARISON:  CT chest 2019 FINDINGS: Cardiovascular: Satisfactory opacification of the pulmonary arteries to the segmental level. No evidence of pulmonary embolism. Stable mild enlargement of the main pulmonary artery. Mild cardiomegaly. Coronary artery calcification. No pericardial effusion. Calcified plaque along the included aorta and proximal great vessels. Ascending aorta measures up to 3.7 cm. Mediastinum/Nodes: No mediastinal, hilar, or axillary adenopathy. Lungs/Pleura: Volume loss in the left hemithorax with postsurgical changes of left upper lobectomy. Emphysema. Bibasilar atelectasis/scarring. Upper Abdomen: No acute abnormality. Musculoskeletal: Mild degenerative changes of the thoracic spine. Review of the MIP images confirms the above findings. IMPRESSION: No acute pulmonary embolism. Mild cardiomegaly. Mild enlargement of the main pulmonary artery suggesting pulmonary arterial hypertension. Aortic Atherosclerosis (ICD10-I70.0) and Emphysema (ICD10-J43.9). Electronically Signed   By: PMalachi Carl  Patel M.D.   On: 11/19/2019 18:42   DG Chest Portable 1 View  Result Date: 12/06/2019 CLINICAL DATA:  Shortness of breath EXAM: PORTABLE CHEST 1 VIEW COMPARISON:  November 11, 2019 FINDINGS: No pneumothorax. There is a small left effusion versus pleural thickening, similar since November 11, 2019. Opacity is seen in the  right lower lobe, similar since November 11, 2019. No change in the cardiomediastinal silhouette. IMPRESSION: 1. Infiltrate persists in the right base, similar since November 11, 2019. There is either a small left effusion or pleural thickening, also stable. Electronically Signed   By: Dorise Bullion III M.D   On: 11/19/2019 17:07   ECHOCARDIOGRAM COMPLETE  Result Date: 11/27/2019   ECHOCARDIOGRAM REPORT   Patient Name:   Kari Robinson Date of Exam: 11/27/2019 Medical Rec #:  [id number]       Height:       62.0 in Accession #:    [id number]      Weight:       166.0 lb Date of Birth:  [birth year]      BSA:          1.77 m Patient Age:    84 years        BP:           167/111 mmHg Patient Gender: F               HR:           123 bpm. Exam Location:  Inpatient Procedure: 2D Echo, Color Doppler and Cardiac Doppler Indications:    P10.31 Acute diastolic (congestive) heart failure  History:        Patient has prior history of Echocardiogram examinations, most                 recent 06/17/2019. CHF, CAD, COPD; Risk Factors:Hypertension,                 Diabetes and Dyslipidemia. ESRD, Lung Cancer.  Sonographer:    Raquel Sarna Senior RDCS Referring Phys: [id number] [place]  1. Left ventricular ejection fraction, by visual estimation, is 30 to 35%. The left ventricle has severely decreased function. There is no left ventricular hypertrophy.  2. Mildly dilated left ventricular internal cavity size.  3. The left ventricle demonstrates global hypokinesis.  4. Global right ventricle has normal systolic function.The right ventricular size is normal. No increase in right ventricular wall thickness.  5. Left atrial size was moderately dilated.  6. Right atrial size was normal.  7. Mild mitral annular calcification.  8. The mitral valve is normal in structure. Moderate mitral valve regurgitation. No evidence of mitral stenosis.  9. The tricuspid valve is normal in structure. 10. The aortic valve is normal in  structure. Aortic valve regurgitation is mild. Mild aortic valve sclerosis without stenosis. 11. The pulmonic valve was normal in structure. Pulmonic valve regurgitation is not visualized. 12. Moderately elevated pulmonary artery systolic pressure. 13. The tricuspid regurgitant velocity is 3.46 m/s, and with an assumed right atrial pressure of 3 mmHg, the estimated right ventricular systolic pressure is moderately elevated at 50.9 mmHg. 14. The inferior vena cava is normal in size with <50% respiratory variability, suggesting right atrial pressure of 8 mmHg. In comparison to the previous echocardiogram(s): EF remains reduced (worse than prior EF 40-45%). FINDINGS  Left Ventricle: Left ventricular ejection fraction, by visual estimation, is 30 to 35%. The left ventricle has severely decreased function. The left ventricle demonstrates global hypokinesis. The left  ventricular internal cavity size was mildly dilated left ventricle. There is no left ventricular hypertrophy. Normal left atrial pressure. Right Ventricle: The right ventricular size is normal. No increase in right ventricular wall thickness. Global RV systolic function is has normal systolic function. The tricuspid regurgitant velocity is 3.46 m/s, and with an assumed right atrial pressure  of 3 mmHg, the estimated right ventricular systolic pressure is moderately elevated at 50.9 mmHg. Left Atrium: Left atrial size was moderately dilated. Right Atrium: Right atrial size was normal in size Pericardium: There is no evidence of pericardial effusion. Mitral Valve: The mitral valve is normal in structure. There is mild thickening of the mitral valve leaflet(s). Mild mitral annular calcification. Moderate mitral valve regurgitation. No evidence of mitral valve stenosis by observation. MV peak gradient,  13.1 mmHg. Tricuspid Valve: The tricuspid valve is normal in structure. Tricuspid valve regurgitation moderate. Aortic Valve: The aortic valve is normal in  structure. Aortic valve regurgitation is mild. Mild aortic valve sclerosis is present, with no evidence of aortic valve stenosis. Pulmonic Valve: The pulmonic valve was normal in structure. Pulmonic valve regurgitation is not visualized. Pulmonic regurgitation is not visualized. Aorta: The aortic root, ascending aorta and aortic arch are all structurally normal, with no evidence of dilitation or obstruction. Venous: The inferior vena cava is normal in size with less than 50% respiratory variability, suggesting right atrial pressure of 8 mmHg. IAS/Shunts: No atrial level shunt detected by color flow Doppler. There is no evidence of a patent foramen ovale. No ventricular septal defect is seen or detected. There is no evidence of an atrial septal defect.  LEFT VENTRICLE PLAX 2D LVIDd:         5.70 cm LVIDs:         4.90 cm LV PW:         1.30 cm LV IVS:        0.90 cm LVOT diam:     2.00 cm LV SV:         47 ml LV SV Index:   25.64 LVOT Area:     3.14 cm  LV Volumes (MOD) LV area d, A2C:    39.50 cm LV area d, A4C:    37.10 cm LV area s, A2C:    33.90 cm LV area s, A4C:    28.00 cm LV major d, A2C:   9.37 cm LV major d, A4C:   8.48 cm LV major s, A2C:   9.28 cm LV major s, A4C:   7.96 cm LV vol d, MOD A2C: 137.0 ml LV vol d, MOD A4C: 136.0 ml LV vol s, MOD A2C: 103.0 ml LV vol s, MOD A4C: 85.2 ml LV SV MOD A2C:     34.0 ml LV SV MOD A4C:     136.0 ml LV SV MOD BP:      42.3 ml RIGHT VENTRICLE RV S prime:     18.10 cm/s TAPSE (M-mode): 1.9 cm LEFT ATRIUM             Index       RIGHT ATRIUM           Index LA diam:        4.20 cm 2.38 cm/m  RA Area:     16.00 cm LA Vol (A2C):   84.1 ml 47.62 ml/m RA Volume:   44.90 ml  25.42 ml/m LA Vol (A4C):   72.1 ml 40.82 ml/m LA Biplane Vol: 79.5 ml 45.01 ml/m  AORTIC VALVE  LVOT Vmax:   106.00 cm/s LVOT Vmean:  73.200 cm/s LVOT VTI:    0.168 m  AORTA Ao Root diam: 3.00 cm Ao Asc diam:  3.70 cm MITRAL VALVE                         TRICUSPID VALVE MV Area (PHT): 8.25 cm               TR Peak grad:   47.9 mmHg MV Peak grad:  13.1 mmHg             TR Vmax:        346.00 cm/s MV Mean grad:  5.0 mmHg MV Vmax:       1.81 m/s              SHUNTS MV Vmean:      101.0 cm/s            Systemic VTI:  0.17 m MV VTI:        0.22 m                Systemic Diam: 2.00 cm MV PHT:        26.68 msec MV Decel Time: 92 msec MV E velocity: 148.00 cm/s 103 cm/s MV A velocity: 45.80 cm/s  70.3 cm/s MV E/A ratio:  3.23        1.5  Candee Furbish MD Electronically signed by Candee Furbish MD Signature Date/Time: 11/27/2019/11:43:14 AM    Final     Pending Labs Unresulted Labs (From admission, onward)    Start     Ordered   11/27/19 0318  SARS CORONAVIRUS 2 (TAT 6-24 HRS) Nasopharyngeal Nasopharyngeal Swab  (Tier 3 (TAT 6-24 hrs))  ONCE - STAT,   STAT    Question Answer Comment  Is this test for diagnosis or screening Screening   Symptomatic for COVID-19 as defined by CDC No   Hospitalized for COVID-19 No   Admitted to ICU for COVID-19 No   Previously tested for COVID-19 Yes   Resident in a congregate (group) care setting No   Employed in healthcare setting No   Pregnant No      11/27/19 0318   Signed and Held  Renal function panel  Once,   R     Signed and Held   Signed and Held  CBC  Once,   R     Signed and Held          Vitals/Pain Today's Vitals   11/27/19 1630 11/27/19 1645 11/27/19 1700 11/27/19 1730  BP: (!) 149/90 (!) 147/100  137/89  Pulse: (!) 119   (!) 123  Resp: (!) 24 (!) 25  (!) 25  Temp:      TempSrc:      SpO2: 99%   98%  Weight:      Height:      PainSc:   0-No pain     Isolation Precautions No active isolations  Medications Medications  nitroGLYCERIN (NITROGLYN) 2 % ointment 0.5 inch (0.5 inches Topical Not Given 11/27/19 1617)  aspirin EC tablet 81 mg (81 mg Oral Given 11/27/19 0821)  atorvastatin (LIPITOR) tablet 40 mg (40 mg Oral Given 11/27/19 1615)  metoprolol succinate (TOPROL-XL) 24 hr tablet 50 mg (has no administration in time range)   nitroGLYCERIN (NITROSTAT) SL tablet 0.4 mg (0.4 mg Sublingual Given 11/27/19 0410)  buPROPion (WELLBUTRIN SR) 12 hr tablet 150 mg (150 mg Oral Given 11/27/19 1616)  ferric citrate (AURYXIA) tablet 630 mg (has no administration in time range)  clopidogrel (PLAVIX) tablet 75 mg (75 mg Oral Given 11/27/19 0822)  multivitamin (RENA-VIT) tablet 1 tablet (1 tablet Oral Given 11/27/19 1615)  feeding supplement (NEPRO CARB STEADY) liquid 237 mL (237 mLs Oral Not Given 11/27/19 1607)  albuterol (VENTOLIN HFA) 108 (90 Base) MCG/ACT inhaler 2 puff (2 puffs Inhalation Given 11/27/19 0819)  mometasone-formoterol (DULERA) 200-5 MCG/ACT inhaler 2 puff (2 puffs Inhalation Not Given 11/27/19 0915)  heparin injection 5,000 Units (5,000 Units Subcutaneous Given 11/27/19 0821)  acetaminophen (TYLENOL) tablet 650 mg (has no administration in time range)    Or  acetaminophen (TYLENOL) suppository 650 mg (has no administration in time range)  0.9 %  sodium chloride infusion (has no administration in time range)  0.9 %  sodium chloride infusion (has no administration in time range)  alteplase (CATHFLO ACTIVASE) injection 2 mg (has no administration in time range)  heparin injection 1,000 Units (has no administration in time range)  lidocaine (PF) (XYLOCAINE) 1 % injection 5 mL (has no administration in time range)  lidocaine-prilocaine (EMLA) cream 1 application (has no administration in time range)  pentafluoroprop-tetrafluoroeth (GEBAUERS) aerosol 1 application (has no administration in time range)  heparin injection 1,400 Units (has no administration in time range)  0.9 %  sodium chloride infusion (has no administration in time range)  0.9 %  sodium chloride infusion (has no administration in time range)  ferric citrate (AURYXIA) tablet 630 mg (has no administration in time range)  LORazepam (ATIVAN) injection 1 mg (1 mg Intravenous Given 11/27/19 0828)  Chlorhexidine Gluconate Cloth 2 % PADS 6 each (has no  administration in time range)  calcium gluconate 1 g in sodium chloride 0.9 % 100 mL IVPB (0 g Intravenous Stopped 12/13/2019 1933)  aspirin chewable tablet 324 mg (324 mg Oral Given 12/06/2019 1754)  fentaNYL (SUBLIMAZE) injection 50 mcg (50 mcg Intravenous Given 11/19/2019 1755)  iohexol (OMNIPAQUE) 350 MG/ML injection 75 mL (75 mLs Intravenous Contrast Given 11/17/2019 1823)  metoprolol tartrate (LOPRESSOR) injection 5 mg (5 mg Intravenous Given 11/29/2019 1959)  furosemide (LASIX) injection 80 mg (80 mg Intravenous Given 11/22/2019 2319)  albuterol (PROVENTIL) (2.5 MG/3ML) 0.083% nebulizer solution 10 mg (10 mg Nebulization Given 11/27/19 0116)  insulin aspart (novoLOG) injection 5 Units (5 Units Intravenous Given 11/27/19 0256)    And  dextrose 50 % solution 50 mL (50 mLs Intravenous Given 11/27/19 0258)  sodium bicarbonate injection 50 mEq (50 mEq Intravenous Given 11/27/19 0301)  sodium zirconium cyclosilicate (LOKELMA) packet 10 g (10 g Oral Given 11/27/19 0436)  calcium gluconate 1 g/ 50 mL sodium chloride IVPB (0 mg Intravenous Stopped 11/27/19 0407)  fentaNYL (SUBLIMAZE) injection 12.5 mcg (12.5 mcg Intravenous Given 11/27/19 0612)    Mobility manual wheelchair High fall risk   Focused Assessments Renal Assessment Handoff:  Hemodialysis Schedule: Hemodialysis Schedule: Tuesday/Thursday/Saturday Last Hemodialysis date and time:    Restricted appendage: RT ARM     R Recommendations: See Admitting Provider Note  Report given to:   Additional Notes:

## 2019-11-27 NOTE — ED Notes (Signed)
Pt moved to a regular hospital for comfort  Still c/o some sob respiratory contacted to check the pt

## 2019-11-27 NOTE — Progress Notes (Signed)
CSW notified Colleen, Renal Navigator of patient's presence at [facility] ED.  Madilyn Fireman, MSW, LCSW-A Transitions of Care  Clinical Social Worker  [facility] Emergency Departments  Medical ICU [phone]

## 2019-11-27 NOTE — ED Triage Notes (Signed)
TC to PT'S to report Mrs reiland is going to Dialysis now.

## 2019-11-27 NOTE — Progress Notes (Signed)
PROGRESS NOTE    Kari Robinson  WFU:[id number] DOB: [birth year] DOA: 12/04/2019 PCP: Sonia Side., FNP   Brief Narrative: Kari Robinson is a 69 y.o. female with medical history significant of CAD, COPD (chronic hypoxemic respiratory failure 3L), CAD s/p PCI, Combined systolic and diastolic heart failure, RAS, HTN, HLD, ESRD on MWF HD, Depression and AAA. Patient presented secondary to shortness of breath with concern for possible heart failure exacerbation/fluid overload.    Assessment & Plan:   Active Problems:   COPD (chronic obstructive pulmonary disease) ([name])   Anemia of chronic disease   Acute hypoxemic respiratory failure ([name])   Acute on chronic respiratory failure with hypoxia Unsure of etiology. No significant edema on imaging. No evidence of pneumonia. Appears to be severely anxious which is likely a significant contributor. Patient uses 4 Lpm of oxygen via nasal canula. -Continue oxygen supplementation with goal O2 of 92% -ABG if able (patient has been making this difficult to obtain)  Acute on chronic combined systolic and diastolic heart failure Patient's fluid status is managed by hemodialysis. Patient received lasix IV on admission. Transthoracic Echocardiogram ordered -Fluid management per HD -Daily weights and strict in/out  Anxiety -Start Avitan IV prn  CAD s/p PCI Hypertension Hyperlipidemia Recent left heart catheterization in December of 2020 with diffuse mild non-obstructive disease in LAD, moderate global LV systolic dysfunction. Troponin mildly elevated but down-trending. -Continue Aspirin, Plavix and Lipitor  ESRD on MWF Nephrology consulted -HD per nephrology  Elevated lactate Possible from poor perfusion vs albuterol. BP is elevated.   Anemia of chronic disease In setting of chronic kidney disease. Baseline creatinine appears to be around 10. Hemoglobin down to 8.7 today. No evidence of bleeding. -Daily CBC  COPD No wheezing.  Presentation does not seem consistent with exacerbation -Continue albuterol  Depression -Continue wellbutrin. Seroquel held secondary to prolonged QTc.   DVT prophylaxis: Heparin Code Status:   Code Status: Full Code Family Communication: None Disposition Plan: Discharge pending improvement of dyspnea   Consultants:   Nephrology  Procedures:   HD  Antimicrobials:  None    Subjective: Dyspnea. Anxious.   Objective: Vitals:   11/27/19 0515 11/27/19 0600 11/27/19 0645 11/27/19 0700  BP: (!) 175/99 (!) 148/96  (!) 181/108  Pulse: (!) 119 (!) 118 (!) 119 (!) 121  Resp: (!) 22 (!) 24 19 20   Temp:      TempSrc:      SpO2: 96% 100% 100% 100%  Weight:      Height:        Intake/Output Summary (Last 24 hours) at 11/27/2019 2025 Last data filed at 12/04/2019 1933 Gross per 24 hour  Intake 100 ml  Output --  Net 100 ml   Filed Weights   11/22/2019 1635  Weight: 75.3 kg    Examination:  General exam: Appears anxious Respiratory system: Clear to auscultation. Tachypnea Cardiovascular system: S1 & S2 heard. Tachycardia. Regular rhythm. No murmurs, rubs, gallops or clicks. Gastrointestinal system: Abdomen is nondistended, soft and nontender. No organomegaly or masses felt. Normal bowel sounds heard. Central nervous system: Alert and oriented. No focal neurological deficits. Extremities: No edema. No calf tenderness Skin: No cyanosis. No rashes Psychiatry: Judgement and insight appear normal. Significantly anxious    Data Reviewed: I have personally reviewed following labs and imaging studies  CBC: Recent Labs  Lab 12/03/2019 1639 12/06/2019 1655 12/10/2019 2313 11/27/19 0429  WBC 13.2*  --  6.1 7.7  HGB 10.6* 11.2*  11.2* 10.0* 8.7*  HCT 34.7* 33.0*  33.0* 32.3* 28.9*  MCV 113.0*  --  110.2* 116.1*  PLT 248  --  201 361   Basic Metabolic Panel: Recent Labs  Lab 12/04/2019 1639 11/24/2019 1655 11/23/2019 2336 11/27/19 0429  NA 140 138  138 136 139  K 5.7*  5.7*  5.8* 6.6* 5.4*  CL 98 103 96* 99  CO2 20*  --  19* 17*  GLUCOSE 160* 150* 154* 142*  BUN 73* 87* 77* 78*  CREATININE 12.55* 13.20* 13.09* 12.34*  CALCIUM 9.3  --  9.7 9.7  MG  --   --  3.0*  --   PHOS  --   --   --  7.4*   GFR: Estimated Creatinine Clearance: 4.1 mL/min (A) (by C-G formula based on SCr of 12.34 mg/dL (H)). Liver Function Tests: Recent Labs  Lab 11/21/2019 1639  AST 28  ALT 14  ALKPHOS 106  BILITOT 0.7  PROT 6.8  ALBUMIN 3.8   No results for input(s): LIPASE, AMYLASE in the last 168 hours. No results for input(s): AMMONIA in the last 168 hours. Coagulation Profile: No results for input(s): INR, PROTIME in the last 168 hours. Cardiac Enzymes: No results for input(s): CKTOTAL, CKMB, CKMBINDEX, TROPONINI in the last 168 hours. BNP (last 3 results) No results for input(s): PROBNP in the last 8760 hours. HbA1C: No results for input(s): HGBA1C in the last 72 hours. CBG: No results for input(s): GLUCAP in the last 168 hours. Lipid Profile: No results for input(s): CHOL, HDL, LDLCALC, TRIG, CHOLHDL, LDLDIRECT in the last 72 hours. Thyroid Function Tests: No results for input(s): TSH, T4TOTAL, FREET4, T3FREE, THYROIDAB in the last 72 hours. Anemia Panel: No results for input(s): VITAMINB12, FOLATE, FERRITIN, TIBC, IRON, RETICCTPCT in the last 72 hours. Sepsis Labs: Recent Labs  Lab 11/20/2019 1639 12/11/2019 1957 11/24/2019 2340  LATICACIDVEN 3.4* 3.5* 3.5*    No results found for this or any previous visit (from the past 240 hour(s)).       Radiology Studies: CTA Chest for PE  Result Date: 12/10/2019 CLINICAL DATA:  Shortness of breath EXAM: CT ANGIOGRAPHY CHEST WITH CONTRAST TECHNIQUE: Multidetector CT imaging of the chest was performed using the standard protocol during bolus administration of intravenous contrast. Multiplanar CT image reconstructions and MIPs were obtained to evaluate the vascular anatomy. CONTRAST:  60mL OMNIPAQUE IOHEXOL 350  MG/ML SOLN COMPARISON:  CT chest 2019 FINDINGS: Cardiovascular: Satisfactory opacification of the pulmonary arteries to the segmental level. No evidence of pulmonary embolism. Stable mild enlargement of the main pulmonary artery. Mild cardiomegaly. Coronary artery calcification. No pericardial effusion. Calcified plaque along the included aorta and proximal great vessels. Ascending aorta measures up to 3.7 cm. Mediastinum/Nodes: No mediastinal, hilar, or axillary adenopathy. Lungs/Pleura: Volume loss in the left hemithorax with postsurgical changes of left upper lobectomy. Emphysema. Bibasilar atelectasis/scarring. Upper Abdomen: No acute abnormality. Musculoskeletal: Mild degenerative changes of the thoracic spine. Review of the MIP images confirms the above findings. IMPRESSION: No acute pulmonary embolism. Mild cardiomegaly. Mild enlargement of the main pulmonary artery suggesting pulmonary arterial hypertension. Aortic Atherosclerosis (ICD10-I70.0) and Emphysema (ICD10-J43.9). Electronically Signed   By: Macy Mis M.D.   On: 11/21/2019 18:42   DG Chest Portable 1 View  Result Date: 11/20/2019 CLINICAL DATA:  Shortness of breath EXAM: PORTABLE CHEST 1 VIEW COMPARISON:  November 11, 2019 FINDINGS: No pneumothorax. There is a small left effusion versus pleural thickening, similar since November 11, 2019. Opacity is seen in the right lower lobe, similar  since November 11, 2019. No change in the cardiomediastinal silhouette. IMPRESSION: 1. Infiltrate persists in the right base, similar since November 11, 2019. There is either a small left effusion or pleural thickening, also stable. Electronically Signed   By: Dorise Bullion III M.D   On: 12/09/2019 17:07        Scheduled Meds: . aspirin EC  81 mg Oral Daily  . atorvastatin  40 mg Oral q1800  . buPROPion  150 mg Oral BID  . clopidogrel  75 mg Oral Daily  . feeding supplement (NEPRO CARB STEADY)  237 mL Oral BID BM  . ferric citrate  630 mg Oral  Q M,W,F-2000  . [START ON Dec 22, 2019] ferric citrate  630 mg Oral 3 times per day on Sun Tue Thu Sat  . heparin  5,000 Units Subcutaneous Q12H  . metoprolol succinate  50 mg Oral QHS  . mometasone-formoterol  2 puff Inhalation BID  . multivitamin  1 tablet Oral QHS  . nitroGLYCERIN  0.5 inch Topical Q6H   Continuous Infusions: . sodium chloride    . sodium chloride    . sodium chloride    . sodium chloride       LOS: 1 day     Cordelia Poche, MD [facility] 11/27/2019, 8:22 AM  If 7PM-7AM, please contact night-coverage [email or web address]

## 2019-11-28 ENCOUNTER — Inpatient Hospital Stay (HOSPITAL_COMMUNITY): Payer: Medicare Other

## 2019-11-28 ENCOUNTER — Inpatient Hospital Stay (HOSPITAL_COMMUNITY): Payer: Medicare Other | Admitting: Registered Nurse

## 2019-11-28 DIAGNOSIS — N19 Unspecified kidney failure: Secondary | ICD-10-CM

## 2019-11-28 DIAGNOSIS — J9601 Acute respiratory failure with hypoxia: Secondary | ICD-10-CM

## 2019-11-28 DIAGNOSIS — E878 Other disorders of electrolyte and fluid balance, not elsewhere classified: Secondary | ICD-10-CM

## 2019-11-28 DIAGNOSIS — I469 Cardiac arrest, cause unspecified: Secondary | ICD-10-CM

## 2019-11-28 DIAGNOSIS — N179 Acute kidney failure, unspecified: Secondary | ICD-10-CM

## 2019-11-28 DIAGNOSIS — E872 Acidosis: Secondary | ICD-10-CM

## 2019-11-28 DIAGNOSIS — E875 Hyperkalemia: Secondary | ICD-10-CM

## 2019-11-28 LAB — COMPREHENSIVE METABOLIC PANEL
ALT: 70 U/L — ABNORMAL HIGH (ref 0–44)
ALT: 849 U/L — ABNORMAL HIGH (ref 0–44)
AST: 82 U/L — ABNORMAL HIGH (ref 15–41)
AST: 925 U/L — ABNORMAL HIGH (ref 15–41)
Albumin: 3.4 g/dL — ABNORMAL LOW (ref 3.5–5.0)
Albumin: 2.6 g/dL — ABNORMAL LOW (ref 3.5–5.0)
Alkaline Phosphatase: 89 U/L (ref 38–126)
Alkaline Phosphatase: 105 U/L (ref 38–126)
Anion gap: 26 — ABNORMAL HIGH (ref 5–15)
Anion gap: 23 — ABNORMAL HIGH (ref 5–15)
BUN: 35 mg/dL — ABNORMAL HIGH (ref 8–23)
BUN: 36 mg/dL — ABNORMAL HIGH (ref 8–23)
CO2: 17 mmol/L — ABNORMAL LOW (ref 22–32)
CO2: 22 mmol/L (ref 22–32)
Calcium: 9.4 mg/dL (ref 8.9–10.3)
Calcium: 8.9 mg/dL (ref 8.9–10.3)
Chloride: 96 mmol/L — ABNORMAL LOW (ref 98–111)
Chloride: 101 mmol/L (ref 98–111)
Creatinine, Ser: 7.73 mg/dL — ABNORMAL HIGH (ref 0.44–1.00)
Creatinine, Ser: 7.56 mg/dL — ABNORMAL HIGH (ref 0.44–1.00)
GFR calc Af Amer: 6 mL/min — ABNORMAL LOW (ref >60)
GFR calc Af Amer: 6 mL/min — ABNORMAL LOW (ref >60)
GFR calc non Af Amer: 5 mL/min — ABNORMAL LOW (ref >60)
GFR calc non Af Amer: 5 mL/min — ABNORMAL LOW (ref >60)
Glucose, Bld: 172 mg/dL — ABNORMAL HIGH (ref 70–99)
Glucose, Bld: 55 mg/dL — ABNORMAL LOW (ref 70–99)
Potassium: >7.5 mmol/L (ref 3.5–5.1)
Potassium: 6.2 mmol/L — ABNORMAL HIGH (ref 3.5–5.1)
Sodium: 139 mmol/L (ref 135–145)
Sodium: 146 mmol/L — ABNORMAL HIGH (ref 135–145)
Total Bilirubin: 0.8 mg/dL (ref 0.3–1.2)
Total Bilirubin: 1.2 mg/dL (ref 0.3–1.2)
Total Protein: 6.3 g/dL — ABNORMAL LOW (ref 6.5–8.1)
Total Protein: 5 g/dL — ABNORMAL LOW (ref 6.5–8.1)

## 2019-11-28 LAB — POCT I-STAT 7, (LYTES, BLD GAS, ICA,H+H)
Acid-base deficit: 9 mmol/L — ABNORMAL HIGH (ref 0.0–2.0)
Bicarbonate: 19.9 mmol/L — ABNORMAL LOW (ref 20.0–28.0)
Bicarbonate: 25.9 mmol/L (ref 20.0–28.0)
Calcium, Ion: 1.2 mmol/L (ref 1.15–1.40)
Calcium, Ion: 1.05 mmol/L — ABNORMAL LOW (ref 1.15–1.40)
HCT: 26 % — ABNORMAL LOW (ref 36.0–46.0)
HCT: 36 % (ref 36.0–46.0)
Hemoglobin: 8.8 g/dL — ABNORMAL LOW (ref 12.0–15.0)
Hemoglobin: 12.2 g/dL (ref 12.0–15.0)
O2 Saturation: 95 %
O2 Saturation: 100 %
Patient temperature: 97.3
Patient temperature: 97.7
Potassium: 6 mmol/L — ABNORMAL HIGH (ref 3.5–5.1)
Potassium: 6.3 mmol/L (ref 3.5–5.1)
Sodium: 141 mmol/L (ref 135–145)
Sodium: 142 mmol/L (ref 135–145)
TCO2: 22 mmol/L (ref 22–32)
TCO2: 27 mmol/L (ref 22–32)
pCO2 arterial: 56.7 mmHg — ABNORMAL HIGH (ref 32.0–48.0)
pCO2 arterial: 44.9 mmHg (ref 32.0–48.0)
pH, Arterial: 7.15 — CL (ref 7.350–7.450)
pH, Arterial: 7.367 (ref 7.350–7.450)
pO2, Arterial: 93 mmHg (ref 83.0–108.0)
pO2, Arterial: 250 mmHg — ABNORMAL HIGH (ref 83.0–108.0)

## 2019-11-28 LAB — BASIC METABOLIC PANEL
Anion gap: 23 — ABNORMAL HIGH (ref 5–15)
BUN: 35 mg/dL — ABNORMAL HIGH (ref 8–23)
CO2: 21 mmol/L — ABNORMAL LOW (ref 22–32)
Calcium: 8.8 mg/dL — ABNORMAL LOW (ref 8.9–10.3)
Chloride: 101 mmol/L (ref 98–111)
Creatinine, Ser: 7.5 mg/dL — ABNORMAL HIGH (ref 0.44–1.00)
GFR calc Af Amer: 6 mL/min — ABNORMAL LOW (ref >60)
GFR calc non Af Amer: 5 mL/min — ABNORMAL LOW (ref >60)
Glucose, Bld: 55 mg/dL — ABNORMAL LOW (ref 70–99)
Potassium: 6.2 mmol/L — ABNORMAL HIGH (ref 3.5–5.1)
Sodium: 145 mmol/L (ref 135–145)

## 2019-11-28 LAB — CBC
HCT: 33.6 % — ABNORMAL LOW (ref 36.0–46.0)
HCT: 41.8 % (ref 36.0–46.0)
Hemoglobin: 9.8 g/dL — ABNORMAL LOW (ref 12.0–15.0)
Hemoglobin: 12.4 g/dL (ref 12.0–15.0)
MCH: 34.6 pg — ABNORMAL HIGH (ref 26.0–34.0)
MCH: 34.3 pg — ABNORMAL HIGH (ref 26.0–34.0)
MCHC: 29.2 g/dL — ABNORMAL LOW (ref 30.0–36.0)
MCHC: 29.7 g/dL — ABNORMAL LOW (ref 30.0–36.0)
MCV: 118.7 fL — ABNORMAL HIGH (ref 80.0–100.0)
MCV: 115.8 fL — ABNORMAL HIGH (ref 80.0–100.0)
Platelets: 267 10*3/uL (ref 150–400)
Platelets: 168 10*3/uL (ref 150–400)
RBC: 2.83 MIL/uL — ABNORMAL LOW (ref 3.87–5.11)
RBC: 3.61 MIL/uL — ABNORMAL LOW (ref 3.87–5.11)
RDW: 19.3 % — ABNORMAL HIGH (ref 11.5–15.5)
RDW: 19.5 % — ABNORMAL HIGH (ref 11.5–15.5)
WBC: 16.8 10*3/uL — ABNORMAL HIGH (ref 4.0–10.5)
WBC: 6.1 10*3/uL (ref 4.0–10.5)
nRBC: 1.7 % — ABNORMAL HIGH (ref 0.0–0.2)
nRBC: 17.4 % — ABNORMAL HIGH (ref 0.0–0.2)

## 2019-11-28 LAB — AMMONIA: Ammonia: 110 umol/L — ABNORMAL HIGH (ref 9–35)

## 2019-11-28 LAB — LACTIC ACID, PLASMA: Lactic Acid, Venous: 9.7 mmol/L (ref 0.5–1.9)

## 2019-11-28 LAB — PROTIME-INR
INR: 1.1 (ref 0.8–1.2)
Prothrombin Time: 14.3 seconds (ref 11.4–15.2)

## 2019-11-28 LAB — LACTATE DEHYDROGENASE: LDH: 366 U/L — ABNORMAL HIGH (ref 98–192)

## 2019-11-28 LAB — TROPONIN I (HIGH SENSITIVITY)
Troponin I (High Sensitivity): 331 ng/L
Troponin I (High Sensitivity): 574 ng/L

## 2019-11-28 LAB — TYPE AND SCREEN
ABO/RH(D): O POS
Antibody Screen: NEGATIVE

## 2019-11-28 LAB — APTT: aPTT: 27 seconds (ref 24–36)

## 2019-11-28 LAB — MAGNESIUM: Magnesium: 2.6 mg/dL — ABNORMAL HIGH (ref 1.7–2.4)

## 2019-11-28 LAB — SURGICAL PCR SCREEN
MRSA, PCR: NEGATIVE
Staphylococcus aureus: NEGATIVE

## 2019-11-28 LAB — PROCALCITONIN: Procalcitonin: 4.03 ng/mL

## 2019-11-28 MED ORDER — HEPARIN SODIUM (PORCINE) 1000 UNIT/ML DIALYSIS
1000.0000 [IU] | INTRAMUSCULAR | Status: DC | PRN
  Filled 2019-11-28: qty 6

## 2019-11-28 MED ORDER — MEPERIDINE HCL 25 MG/ML IJ SOLN: 12.5000 mg | Freq: Once | INTRAMUSCULAR | Status: DC

## 2019-11-28 MED ORDER — HEPARIN (PORCINE) IN NACL 2-0.9 UNITS/ML: INTRAMUSCULAR | Status: DC | PRN

## 2019-11-28 MED ORDER — HEPARIN SODIUM (PORCINE) 1000 UNIT/ML IJ SOLN: 2.4000 mL | Freq: Once | INTRAMUSCULAR | Status: DC

## 2019-11-28 MED ORDER — HEPARIN (PORCINE) 2000 UNITS/L FOR CRRT
INTRAVENOUS_CENTRAL | Status: DC | PRN
  Filled 2019-11-28: qty 1000

## 2019-11-28 MED ORDER — FENTANYL CITRATE (PF) 100 MCG/2ML IJ SOLN: 25.0000 ug | INTRAMUSCULAR | Status: DC | PRN

## 2019-11-28 MED ORDER — EPINEPHRINE PF 1 MG/ML IJ SOLN
0.5000 ug/min | INTRAVENOUS | Status: DC
  Filled 2019-11-28: qty 4

## 2019-11-28 MED ORDER — EPINEPHRINE PF 1 MG/ML IJ SOLN
0.5000 ug/min | INTRAVENOUS | Status: AC
  Administered 2019-11-28: 02:00:00 2 ug/min via INTRAVENOUS
  Filled 2019-11-28: qty 4

## 2019-11-28 MED ORDER — NOREPINEPHRINE 4 MG/250ML-% IV SOLN
0.0000 ug/min | INTRAVENOUS | Status: DC
  Administered 2019-11-28: 10 ug/min via INTRAVENOUS
  Filled 2019-11-28: qty 250

## 2019-11-28 MED ORDER — VASOPRESSIN 20 UNIT/ML IV SOLN
0.0400 [IU]/min | INTRAVENOUS | Status: DC
  Administered 2019-11-28: 04:00:00 0.04 [IU]/min via INTRAVENOUS
  Filled 2019-11-28: qty 2

## 2019-11-28 MED ORDER — PRISMASOL BGK 0/2.5 32-2.5 MEQ/L IV SOLN
INTRAVENOUS | Status: DC
  Filled 2019-11-28 (×5): qty 5000

## 2019-11-28 MED ORDER — PRISMASOL BGK 0/2.5 32-2.5 MEQ/L IV SOLN
INTRAVENOUS | Status: DC
  Filled 2019-11-28 (×10): qty 5000

## 2019-11-28 MED ORDER — CALCIUM GLUCONATE-NACL 1-0.675 GM/50ML-% IV SOLN: 1.0000 g | INTRAVENOUS | Status: DC

## 2019-11-28 MED ORDER — PRISMASOL BGK 0/2.5 32-2.5 MEQ/L REPLACEMENT SOLN
Status: DC
  Filled 2019-11-28: qty 5000

## 2019-11-28 MED ORDER — AMIODARONE LOAD VIA INFUSION
150.0000 mg | Freq: Once | INTRAVENOUS | Status: AC
  Administered 2019-11-28: 150 mg via INTRAVENOUS
  Filled 2019-11-28: qty 83.34

## 2019-11-28 MED ORDER — CALCIUM GLUCONATE-NACL 1-0.675 GM/50ML-% IV SOLN
INTRAVENOUS | Status: AC
  Filled 2019-11-28: qty 50

## 2019-11-28 MED ORDER — PANTOPRAZOLE SODIUM 40 MG IV SOLR: 40.0000 mg | Freq: Every day | INTRAVENOUS | Status: DC

## 2019-11-28 MED ORDER — SODIUM BICARBONATE 8.4 % IV SOLN
INTRAVENOUS | Status: AC
  Filled 2019-11-28: qty 50

## 2019-11-28 MED ORDER — AMIODARONE HCL IN DEXTROSE 360-4.14 MG/200ML-% IV SOLN
60.0000 mg/h | INTRAVENOUS | Status: AC
  Administered 2019-11-28: 60 mg/h via INTRAVENOUS
  Filled 2019-11-28: qty 200

## 2019-11-28 MED ORDER — INSULIN ASPART 100 UNIT/ML ~~LOC~~ SOLN: 0.0000 [IU] | SUBCUTANEOUS | Status: DC

## 2019-11-28 MED ORDER — AMIODARONE HCL IN DEXTROSE 360-4.14 MG/200ML-% IV SOLN: 30.0000 mg/h | INTRAVENOUS | Status: DC

## 2019-11-28 MED ORDER — ALBUMIN HUMAN 5 % IV SOLN: 25.0000 g | Freq: Once | INTRAVENOUS | Status: DC

## 2019-11-28 MED ORDER — PRISMASOL BGK 0/2.5 32-2.5 MEQ/L REPLACEMENT SOLN
Status: DC
  Filled 2019-11-28 (×2): qty 5000

## 2019-11-28 MED ORDER — PHENYLEPHRINE HCL-NACL 10-0.9 MG/250ML-% IV SOLN: 0.0000 ug/min | INTRAVENOUS | Status: DC

## 2019-11-28 MED FILL — Medication: Qty: 1 | Status: AC

## 2019-11-28 MED FILL — Medication: Qty: 1 | Status: CN

## 2019-11-30 ENCOUNTER — Telehealth: Payer: Self-pay

## 2019-11-30 NOTE — Telephone Encounter (Signed)
Received a phone call from [facility] regarding a dc..  I explained to the gentleman that the dc would be signed by Doctor Loanne Drilling however she is not on the schedule the remainder of this week.  The dc should be signed by another provider at the pulmonary office and told him to take it there.

## 2019-12-03 LAB — CULTURE, BLOOD (ROUTINE X 2)
Culture: NO GROWTH
Special Requests: ADEQUATE

## 2019-12-18 NOTE — Death Summary Note (Signed)
DEATH SUMMARY   Patient Details  Name: Kari Robinson MRN: [id number] DOB: [birth year]  Admission/Discharge Information   Admit Date:  11/30/2019  Date of Death: Date of Death: Dec 02, 2019  Time of Death: Time of Death: 0643  Length of Stay: 2  Referring Physician: Sonia Side., FNP   Reason(s) for Hospitalization  shortness of breath  Diagnoses  Preliminary cause of death: Cardiac arrest secondary to severe electrolyte abnormalities secondary to acute on chronic renal failure Secondary Diagnoses (including complications and co-morbidities):  Active Problems:   COPD (chronic obstructive pulmonary disease) ([name])   Anemia of chronic disease   Acute hypoxemic respiratory failure ([name])   Cardiac arrest, cause unspecified ([name])   Electrolyte abnormality   Renal failure   Brief Hospital Course (including significant findings, care, treatment, and services provided and events leading to death)  Kari Robinson is a 69 y.o. year old female with ESRD on HD MWF, chronic systolic heart failure, COPD on home 3L, CAD s/p prior stenting and atrial fibrillation who presented with shortness of breath and chest pain and found with with acute on chronic hypoxemic respiratory failure requiring 5L O2. Patient reported missing dialysis on 1/8. Prior to admission she had symptoms of shortness of breath on exertion, chest tightness and heaviness mid-chest. Chest pain did not radiate to neck or arm. Did endorse tingling in the right hand. Had improvement after receiving SL nitro. Only had minimal relief from nebulizer therapy. CXR and CTA obtained. No evidence of acute abnormality with chronic left atelectasis and unchanged right basilar infiltrate. She was admitted to Hospitalist service under working diagnosis of respiratory failure secondary to acute heart failure in setting of recent missed dialysis. She underwent dialysis on 1/11. Overnight, she had code blue called for unresponsiveness and  PEA/asystole at 0040. She was given Epi 1 mg x 5, amiodarone, bicarbonate and defibrillation x 4 for VT/VF. Achieved ROSC and patient was transferred to ICU.  She required vasopressor support for presumed cardiogenic shock and started on amiodarone gtt post-arrest. Vas cath was placed to initiate dialysis for hyperkalemia and metabolic acidosis. Patient again arrested with PEA arrest. Family was notified. Code ended at [phone]  Pertinent Labs and Studies  Significant Diagnostic Studies DG Chest 2 View  Result Date: 11/11/2019 CLINICAL DATA:  69 year old female with central chest pain and increased shortness of breath this morning. History of COPD, lung cancer. Multiple negative tests for COVID-19, most recently 11/05/2019. EXAM: CHEST - 2 VIEW COMPARISON:  Portable chest 11/01/2019 and earlier. FINDINGS: PA and lateral views of the chest. Chronic large lung volumes with emphysema and postoperative changes in the left lung. Stable tortuous aorta with calcified atherosclerosis. Stable cardiac size at the upper limits of normal. Substantial regression but not complete resolution of the multifocal right lung reticulonodular opacity on 11/01/2019. Residual mostly at the lung base. No pneumothorax or pulmonary edema. No pleural effusion. No areas of worsening ventilation. No acute osseous abnormality identified. Possible aneurysmal enlargement of the abdominal aorta, lateral view roughly 4 centimeters diameter. Negative visible bowel gas pattern. IMPRESSION: 1. Chronic lung disease with significantly improved but not completely resolved right lung pneumonia since 11/01/2019. 2. No new cardiopulmonary abnormality. 3. Extensive Aortic Atherosclerosis (ICD10-I70.0) with questionable fusiform aneurysmal enlargement of the abdominal aorta. Follow-up Aorta Ultrasound would be the simplest way to confirm or exclude AAA. Electronically Signed   By: Genevie Ann M.D.   On: 11/11/2019 08:47   CTA Chest for PE  Result Date:  11-30-19 CLINICAL  DATA:  Shortness of breath EXAM: CT ANGIOGRAPHY CHEST WITH CONTRAST TECHNIQUE: Multidetector CT imaging of the chest was performed using the standard protocol during bolus administration of intravenous contrast. Multiplanar CT image reconstructions and MIPs were obtained to evaluate the vascular anatomy. CONTRAST:  23mL OMNIPAQUE IOHEXOL 350 MG/ML SOLN COMPARISON:  CT chest 2019 FINDINGS: Cardiovascular: Satisfactory opacification of the pulmonary arteries to the segmental level. No evidence of pulmonary embolism. Stable mild enlargement of the main pulmonary artery. Mild cardiomegaly. Coronary artery calcification. No pericardial effusion. Calcified plaque along the included aorta and proximal great vessels. Ascending aorta measures up to 3.7 cm. Mediastinum/Nodes: No mediastinal, hilar, or axillary adenopathy. Lungs/Pleura: Volume loss in the left hemithorax with postsurgical changes of left upper lobectomy. Emphysema. Bibasilar atelectasis/scarring. Upper Abdomen: No acute abnormality. Musculoskeletal: Mild degenerative changes of the thoracic spine. Review of the MIP images confirms the above findings. IMPRESSION: No acute pulmonary embolism. Mild cardiomegaly. Mild enlargement of the main pulmonary artery suggesting pulmonary arterial hypertension. Aortic Atherosclerosis (ICD10-I70.0) and Emphysema (ICD10-J43.9). Electronically Signed   By: Macy Mis M.D.   On: 12/12/2019 18:42   CARDIAC CATHETERIZATION  Result Date: 11/01/2019  Prox RCA to Mid RCA lesion is 30% stenosed.  Prox Cx to Mid Cx lesion is 20% stenosed.  Mid Cx lesion is 50% stenosed.  Ost Cx to Prox Cx lesion is 40% stenosed.  Ost LAD to Mid LAD lesion is 30% stenosed.  1st Sept lesion is 60% stenosed.  There is moderate left ventricular systolic dysfunction.  LV end diastolic pressure is moderately elevated.  The left ventricular ejection fraction is 35-45% by visual estimate.  There is mild (2+) mitral  regurgitation.  1. Diffuse, mild non-obstructive disease in the LAD 2. Patent stent in the mid Circumflex with minimal restenosis. Moderate stenosis just beyond the stented segment of the mid Circumflex artery. This does not appear to be flow limiting. 3. Mild non-obstructive disease in the mid RCA 4. Moderate, global LV systolic dysfunction. 5. Elevated filling pressures Recommendations; Medical management of CAD. Explore other causes of chest pain.   US AORTA  Result Date: 11/12/2019 CLINICAL DATA:  AAA seen on chest imaging EXAM: ULTRASOUND OF ABDOMINAL AORTA TECHNIQUE: Ultrasound examination of the abdominal aorta and proximal common iliac arteries was performed to evaluate for aneurysm. Additional color and Doppler images of the distal aorta were obtained to document patency. 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 Recommend followup by ultrasound in 1 year. This recommendation follows ACR consensus guidelines: White Paper of the ACR Incidental Findings Committee II on Vascular Findings. J Am Coll Radiol 2013; 10:789-794. Aortic aneurysm NOS (ICD10-I71.9) Electronically Signed   By: Lovena Le M.D.   On: 11/12/2019 02:02   DG CHEST PORT 1 VIEW  Result Date: 2019/12/05 CLINICAL DATA:  Shortness of breath. EXAM: PORTABLE CHEST 1 VIEW COMPARISON:  Chest x-ray 12-05-2019. CT 11/23/2019. FINDINGS: Endotracheal tube tip 1.6 cm above the lower portion of the carina. Proximal repositioning of approximately 2 cm should be considered. NG tube noted with tip below left hemidiaphragm. Right IJ line in stable position with tip over SVC. Heart size stable. Persistent right base infiltrate, similar to prior exam. No  pleural effusion. No pneumothorax IMPRESSION:  1. Endotracheal tube tip 1.6 cm above the lower portion of the carina. Proximal repositioning of approximately 2 cm should be considered. 2.  Persistent right base infiltrate, similar to prior exam. Electronically Signed   By: Marcello Moores  Register   On: 12-22-2019 07:56   DG CHEST PORT 1 VIEW  Result Date: 12/22/2019 CLINICAL DATA:  69 year old female status post enteric tube and central line placement. EXAM: PORTABLE CHEST 1 VIEW COMPARISON:  Portable chest and CTA chest 12/15/2019. FINDINGS: Portable AP semi upright view at 0310 hours. Intubated. Endotracheal tube tip in good position between the level the clavicles and carina. Enteric tube courses to the abdomen with side hole at the level of the gastroesophageal junction/diaphragm. Right IJ approach central line placed, tip at the right innominate vein level. No pneumothorax. Stable cardiac size and mediastinal contours. Calcified aortic atherosclerosis. Progressive reticulonodular opacity in the right mid and lower lung from yesterday. No pulmonary edema. No pleural effusion. No acute osseous abnormality identified. IMPRESSION: 1. Right IJ central line placed, tip at the right innominate vein level. Advance 2-3 cm to ensure tip placement in the SVC. 2. ETT in good position. Enteric tube side hole at the gastroesophageal junction, advanced several centimeters to ensure side hole placement within the stomach. 3. Progressive reticulonodular opacity in the right lung since yesterday compatible with pneumonia. No pneumothorax or pleural effusion. Electronically Signed   By: Genevie Ann M.D.   On: 12-22-19 03:24   DG Chest Portable 1 View  Result Date: 11/24/2019 CLINICAL DATA:  Shortness of breath EXAM: PORTABLE CHEST 1 VIEW COMPARISON:  November 11, 2019 FINDINGS: No pneumothorax. There is a small left effusion versus pleural thickening, similar since November 11, 2019. Opacity is seen in the right lower lobe, similar  since November 11, 2019. No change in the cardiomediastinal silhouette. IMPRESSION: 1. Infiltrate persists in the right base, similar since November 11, 2019. There is either a small left effusion or pleural thickening, also stable. Electronically Signed   By: Dorise Bullion III M.D   On: 12/07/2019 17:07   DG Chest Port 1 View  Result Date: 11/01/2019 CLINICAL DATA:  Shortness of breath EXAM: PORTABLE CHEST 1 VIEW COMPARISON:  10/31/2019 FINDINGS: Cardiomegaly. There is hyperinflation of the lungs compatible with COPD. Aortic atherosclerosis. Airspace opacities bilaterally, most confluent in the right mid and lower lung. Appearance is concerning for pneumonia. Diffuse interstitial prominence could also be related to infection or interstitial edema. IMPRESSION: Cardiomegaly, COPD.  Possible interstitial edema. Confluent airspace disease in the right mid and lower lung concerning for pneumonia. Electronically Signed   By: Rolm Baptise M.D.   On: 11/01/2019 17:41   DG Chest Portable 1 View  Result Date: 10/31/2019 CLINICAL DATA:  Chest pain and shortness of breath EXAM: PORTABLE CHEST 1 VIEW COMPARISON:  October 19, 2019 FINDINGS: There is slight scarring in the bases. There is no edema or consolidation. Heart is upper normal in size with pulmonary vascularity normal. No adenopathy. There is aortic atherosclerosis. There are foci of calcification in each subclavian and axillary artery. No bone lesions. IMPRESSION: Mild bibasilar scarring. No edema or consolidation. Stable cardiac silhouette. No adenopathy. There is extensive arterial vascular calcification. Aortic Atherosclerosis (ICD10-I70.0). Electronically Signed   By: Lowella Grip III M.D.   On: 10/31/2019 15:59   ECHOCARDIOGRAM COMPLETE  Result Date: 11/27/2019   ECHOCARDIOGRAM REPORT   Patient Name:   RAINN ZUPKO Date of Exam: 11/27/2019 Medical Rec #:  [id number]  Height:       62.0 in Accession #:    [id number]      Weight:        166.0 lb Date of Birth:  [birth year]      BSA:          1.77 m Patient Age:    69 years        BP:           167/111 mmHg Patient Gender: F               HR:           123 bpm. Exam Location:  Inpatient Procedure: 2D Echo, Color Doppler and Cardiac Doppler Indications:    J50.09 Acute diastolic (congestive) heart failure  History:        Patient has prior history of Echocardiogram examinations, most                 recent 06/17/2019. CHF, CAD, COPD; Risk Factors:Hypertension,                 Diabetes and Dyslipidemia. ESRD, Lung Cancer.  Sonographer:    Raquel Sarna Senior RDCS Referring Phys: [id number] [place]  1. Left ventricular ejection fraction, by visual estimation, is 30 to 35%. The left ventricle has severely decreased function. There is no left ventricular hypertrophy.  2. Mildly dilated left ventricular internal cavity size.  3. The left ventricle demonstrates global hypokinesis.  4. Global right ventricle has normal systolic function.The right ventricular size is normal. No increase in right ventricular wall thickness.  5. Left atrial size was moderately dilated.  6. Right atrial size was normal.  7. Mild mitral annular calcification.  8. The mitral valve is normal in structure. Moderate mitral valve regurgitation. No evidence of mitral stenosis.  9. The tricuspid valve is normal in structure. 10. The aortic valve is normal in structure. Aortic valve regurgitation is mild. Mild aortic valve sclerosis without stenosis. 11. The pulmonic valve was normal in structure. Pulmonic valve regurgitation is not visualized. 12. Moderately elevated pulmonary artery systolic pressure. 13. The tricuspid regurgitant velocity is 3.46 m/s, and with an assumed right atrial pressure of 3 mmHg, the estimated right ventricular systolic pressure is moderately elevated at 50.9 mmHg. 14. The inferior vena cava is normal in size with <50% respiratory variability, suggesting right atrial pressure of 8 mmHg. In comparison  to the previous echocardiogram(s): EF remains reduced (worse than prior EF 40-45%). FINDINGS  Left Ventricle: Left ventricular ejection fraction, by visual estimation, is 30 to 35%. The left ventricle has severely decreased function. The left ventricle demonstrates global hypokinesis. The left ventricular internal cavity size was mildly dilated left ventricle. There is no left ventricular hypertrophy. Normal left atrial pressure. Right Ventricle: The right ventricular size is normal. No increase in right ventricular wall thickness. Global RV systolic function is has normal systolic function. The tricuspid regurgitant velocity is 3.46 m/s, and with an assumed right atrial pressure  of 3 mmHg, the estimated right ventricular systolic pressure is moderately elevated at 50.9 mmHg. Left Atrium: Left atrial size was moderately dilated. Right Atrium: Right atrial size was normal in size Pericardium: There is no evidence of pericardial effusion. Mitral Valve: The mitral valve is normal in structure. There is mild thickening of the mitral valve leaflet(s). Mild mitral annular calcification. Moderate mitral valve regurgitation. No evidence of mitral valve stenosis by observation. MV peak gradient,  13.1 mmHg. Tricuspid Valve: The tricuspid valve is normal in  structure. Tricuspid valve regurgitation moderate. Aortic Valve: The aortic valve is normal in structure. Aortic valve regurgitation is mild. Mild aortic valve sclerosis is present, with no evidence of aortic valve stenosis. Pulmonic Valve: The pulmonic valve was normal in structure. Pulmonic valve regurgitation is not visualized. Pulmonic regurgitation is not visualized. Aorta: The aortic root, ascending aorta and aortic arch are all structurally normal, with no evidence of dilitation or obstruction. Venous: The inferior vena cava is normal in size with less than 50% respiratory variability, suggesting right atrial pressure of 8 mmHg. IAS/Shunts: No atrial level shunt  detected by color flow Doppler. There is no evidence of a patent foramen ovale. No ventricular septal defect is seen or detected. There is no evidence of an atrial septal defect.  LEFT VENTRICLE PLAX 2D LVIDd:         5.70 cm LVIDs:         4.90 cm LV PW:         1.30 cm LV IVS:        0.90 cm LVOT diam:     2.00 cm LV SV:         47 ml LV SV Index:   25.64 LVOT Area:     3.14 cm  LV Volumes (MOD) LV area d, A2C:    39.50 cm LV area d, A4C:    37.10 cm LV area s, A2C:    33.90 cm LV area s, A4C:    28.00 cm LV major d, A2C:   9.37 cm LV major d, A4C:   8.48 cm LV major s, A2C:   9.28 cm LV major s, A4C:   7.96 cm LV vol d, MOD A2C: 137.0 ml LV vol d, MOD A4C: 136.0 ml LV vol s, MOD A2C: 103.0 ml LV vol s, MOD A4C: 85.2 ml LV SV MOD A2C:     34.0 ml LV SV MOD A4C:     136.0 ml LV SV MOD BP:      42.3 ml RIGHT VENTRICLE RV S prime:     18.10 cm/s TAPSE (M-mode): 1.9 cm LEFT ATRIUM             Index       RIGHT ATRIUM           Index LA diam:        4.20 cm 2.38 cm/m  RA Area:     16.00 cm LA Vol (A2C):   84.1 ml 47.62 ml/m RA Volume:   44.90 ml  25.42 ml/m LA Vol (A4C):   72.1 ml 40.82 ml/m LA Biplane Vol: 79.5 ml 45.01 ml/m  AORTIC VALVE LVOT Vmax:   106.00 cm/s LVOT Vmean:  73.200 cm/s LVOT VTI:    0.168 m  AORTA Ao Root diam: 3.00 cm Ao Asc diam:  3.70 cm MITRAL VALVE                         TRICUSPID VALVE MV Area (PHT): 8.25 cm              TR Peak grad:   47.9 mmHg MV Peak grad:  13.1 mmHg             TR Vmax:        346.00 cm/s MV Mean grad:  5.0 mmHg MV Vmax:       1.81 m/s              SHUNTS MV Vmean:  101.0 cm/s            Systemic VTI:  0.17 m MV VTI:        0.22 m                Systemic Diam: 2.00 cm MV PHT:        26.68 msec MV Decel Time: 92 msec MV E velocity: 148.00 cm/s 103 cm/s MV A velocity: 45.80 cm/s  70.3 cm/s MV E/A ratio:  3.23        1.5  Candee Furbish MD Electronically signed by Candee Furbish MD Signature Date/Time: 11/27/2019/11:43:14 AM    Final     Microbiology Recent  Results (from the past 240 hour(s))  Surgical pcr screen     Status: None   Collection Time: November 30, 2019  3:21 AM   Specimen: Nasal Mucosa; Nasal Swab  Result Value Ref Range Status   MRSA, PCR NEGATIVE NEGATIVE Final   Staphylococcus aureus NEGATIVE NEGATIVE Final    Comment: (NOTE) The Xpert SA Assay (FDA approved for NASAL specimens in patients 31 years of age and older), is one component of a comprehensive surveillance program. It is not intended to diagnose infection nor to guide or monitor treatment. Performed at [facility], [place] [address], [place], [place] [address]     Lab Basic Metabolic Panel: Recent Labs  Lab 11/17/2019 2336 11/27/19 0429 11-30-19 0043 11/30/19 0209 30-Nov-2019 0303 2019-11-30 0321 11/30/19 0422  NA 136 139 139 141 145 146* 142  K 6.6* 5.4* >7.5* 6.0* 6.2* 6.2* 6.3*  CL 96* 99 96*  --  101 101  --   CO2 19* 17* 17*  --  21* 22  --   GLUCOSE 154* 142* 172*  --  55* 55*  --   BUN 77* 78* 35*  --  35* 36*  --   CREATININE 13.09* 12.34* 7.73*  --  7.50* 7.56*  --   CALCIUM 9.7 9.7 9.4  --  8.8* 8.9  --   MG 3.0*  --   --   --  2.6*  --   --   PHOS  --  7.4*  --   --   --   --   --    Liver Function Tests: Recent Labs  Lab 11/21/2019 1639 Nov 30, 2019 0043 Nov 30, 2019 0321  AST 28 82* 925*  ALT 14 70* 849*  ALKPHOS 106 89 105  BILITOT 0.7 0.8 1.2  PROT 6.8 6.3* 5.0*  ALBUMIN 3.8 3.4* 2.6*   No results for input(s): LIPASE, AMYLASE in the last 168 hours. Recent Labs  Lab 30-Nov-2019 0043  AMMONIA 110*   CBC: Recent Labs  Lab 11/18/2019 1639 12/12/2019 2313 11/27/19 0429 2019/11/30 0043 2019-11-30 0209 30-Nov-2019 0321 30-Nov-2019 0422  WBC 13.2* 6.1 7.7 16.8*  --  6.1  --   HGB 10.6* 10.0* 8.7* 9.8* 8.8* 12.4 12.2  HCT 34.7* 32.3* 28.9* 33.6* 26.0* 41.8 36.0  MCV 113.0* 110.2* 116.1* 118.7*  --  115.8*  --   PLT 248 201 190 267  --  168  --    Cardiac Enzymes: No results for input(s): CKTOTAL, CKMB, CKMBINDEX, TROPONINI in the last 168  hours. Sepsis Labs: Recent Labs  Lab 12/16/2019 1639 12/09/2019 1957 12/01/2019 2313 12/02/2019 2340 11/27/19 0429 11-30-2019 0043 11/30/19 0303 11/30/19 0321  PROCALCITON  --   --   --   --   --   --  4.03  --   WBC 13.2*  --  6.1  --  7.7 16.8*  --  6.1  LATICACIDVEN 3.4* 3.5*  --  3.5*  --   --   --  9.7*    Procedures/Operations  ETT 1/12 Vas Cath 1/12 ACLS x 2 1/12   Christien Berthelot Rodman Pickle 11-29-2019, 9:09 PM

## 2019-12-18 NOTE — Progress Notes (Signed)
Pt unresponsive at bedside. Heart rate 40's afib. Pt without palpabe pulse. CPR started per protcal. Elink called to the bedside.Next of kin notified. Code ended per MD order at [phone]. Time of death noted. CDS notified.

## 2019-12-18 NOTE — Consult Note (Addendum)
NAME:  Kari Robinson, MRN:  [id number], DOB:  [birth year], LOS: 2 ADMISSION DATE:  12/01/2019, CONSULTATION DATE:  12/17/2019 REFERRING MD:  Cordelia Poche, MD CHIEF COMPLAINT:  S/p cardiac arrest  Brief History   69 year old female with ESRD on HD who presented with shortness of breath and chest pain after missing dialysis. Last dialysis session 1/6. Overnight, she had cardiac arrest for 45 minutes with ROSC. PCCM consulted for post-arrest care  History of present illness   Unable to obtain history from patient due to critical illness. History obtained from chart review.  Kari Robinson is a 69 year old female with ESRD on HD MWF, chronic systolic heart failure, COPD on home 3L, CAD s/p prior stenting and atrial fibrillation who presented with shortness of breath and chest pain and found with with acute on chronic hypoxemic respiratory failure requiring 5L O2. Patient reported missing dialysis on 1/8. Prior to admission she had symptoms of shortness of breath on exertion, chest tightness and heaviness mid-chest. Chest pain did not radiate to neck or arm. Did endorse tingling in the right hand. Had improvement after receiving SL nitro. Only had minimal relief from nebulizer therapy. CXR and CTA obtained. No evidence of acute abnormality with chronic left atelectasis and unchanged right basilar infiltrate. She was admitted to Hospitalist service under working diagnosis of respiratory failure secondary to acute heart failure in setting of recent missed dialysis. She underwent dialysis on 1/11. Overnight, she had code blue called for unresponsiveness and PEA/asystole at 0040. She was given Epi 1 mg x 5, amiodarone, bicarbonate and defibrillation x 4 for VT/VF. Achieved ROSC and patient was transferred to ICU.  Past Medical History  ESRD Atrial fibrillation, paroxysmal CAD s/p stent, ischemic cardiomyopathy, chronic systolic heart failure COPD on home 3L via [place] Hx of lung cancer s/p left upper lobe  resection 2017   Significant Hospital Events   1/11 Admitted  Consults:  PCCM  Procedures:  ETT 1/12  Significant Diagnostic Tests:  CTA 12/10/2019 - No pulmonary embolism. S/p left upper lobe lobectomy with expected left mediastinal shift. Background centrilobular emphysema  Micro Data:  BCX 1/12 >  Antimicrobials:    Interim history/subjective:  As above  Objective   Blood pressure (!) 74/47, pulse 76, temperature 98.1 F (36.7 C), temperature source Oral, resp. rate 20, height 5\' 2"  (1.575 m), weight 73.5 kg, SpO2 (!) 86 %.    Vent Mode: PRVC FiO2 (%):  [40 %] 40 % Set Rate:  [18 bmp] 18 bmp Vt Set:  [400 mL] 400 mL PEEP:  [5 cmH20] 5 cmH20 Plateau Pressure:  [16 cmH20] 16 cmH20   Intake/Output Summary (Last 24 hours) at December 17, 2019 0355 Last data filed at 11/27/2019 2200 Gross per 24 hour  Intake 480 ml  Output 2053 ml  Net -1573 ml   Filed Weights   11/30/2019 1635 11/27/19 1840 11/27/19 2121  Weight: 75.3 kg 75.6 kg 73.5 kg    Physical Exam: General: Older-appearing than stated age, critically ill, on mechanical vent HENT: , AT, ETT in place Eyes: EOMI, no scleral icterus Respiratory:Diminished breath sounds bilaterally. No crackles, wheezing or rales Cardiovascular: Bradycardic, -M/R/G, no JVD GI: BS+, soft, nontender Extremities:LUE AVF, -edema,-tenderness Neuro: Unresponsive Skin: Tattoos on right upper chest   Resolved Hospital Problem list    Assessment & Plan:   S/p cardiac arrest Cardiogenic shock - doubt sepsis related. Obtain procal and blood cultures. Code lasted 45 minutes. Unclear etiology. Suspect inciting event likely related to  cardiac arrhythmia in setting of electrolyte abnormalities exacerbated by hypoxemia in setting of worsening heart failure. Initial rhythm asystole/PEA, required defib x 4 for wide-complex tachycardia/Vfib. Plan: Not a candidate for TTM Continue vasopressor support for MAP goal >65: Continue levophed and epi gtt.  Add vasopressin Continue amiodarone gtt Trend trop Hold QTc prolonging drugs Cardiology in am though unlikely candidate for cath based on prior recommendations  Electrolyte abnormalities: Hyperkalemia and metabolic acidosis ESRD on MWF Missed 1/8 dialysis. S/p insulin, glucose intra-code Plan: Place vas cath Consult Nephrology for CRRT Trend BMP, Mg, Phos  Acute on chronic hypoxemic respiratory failure secondary to critical illness COPD on 3L at home Plan: Full vent support Wean PEEP/FIO2 for goal SpO2 88-95% or pO2 55-80 VAP  Acute on chronic systolic heart failure CAD s/p prior stent Recent echo on 1/11 with depressed EF 30-35% with global hypokinesis which has worsened compared to prior. Plan Place vas cath for dialysis Hold BB in setting of hypotension Continue ASA, Plavix, statin  Acute encephalopathy Likely related to critical illness, post-arrest. Code lasted 45 minutes. Prognosis for neurologic recovery remains guarded however prolonged arrest is concerning for possible anoxic brain injury Plan Supportive care and management as above  DM2 CBG q4h Sensitive SSI  Best practice:  Diet: NPO Pain/Anxiety/Delirium protocol (if indicated): PRN fentanyl VAP protocol (if indicated): Yes DVT prophylaxis: SubQ GI prophylaxis: PPI Glucose control: CBG q4h Mobility: BR Code Status: Full Family Communication: CCM updated post-arrest on 1/12. Disposition: Remain in ICU  Labs   CBC: Recent Labs  Lab 12/13/2019 1639 11/29/2019 1655 12/10/2019 2313 11/27/19 0429 09-Dec-2019 0043  WBC 13.2*  --  6.1 7.7 16.8*  HGB 10.6* 11.2*  11.2* 10.0* 8.7* 9.8*  HCT 34.7* 33.0*  33.0* 32.3* 28.9* 33.6*  MCV 113.0*  --  110.2* 116.1* 118.7*  PLT 248  --  201 190 528    Basic Metabolic Panel: Recent Labs  Lab 12/15/2019 1639 12/07/2019 1655 12/15/2019 2336 11/27/19 0429 12/09/2019 0043  NA 140 138  138 136 139 139  K 5.7* 5.7*  5.8* 6.6* 5.4* >7.5*  CL 98 103 96* 99 96*  CO2 20*   --  19* 17* 17*  GLUCOSE 160* 150* 154* 142* 172*  BUN 73* 87* 77* 78* 35*  CREATININE 12.55* 13.20* 13.09* 12.34* 7.73*  CALCIUM 9.3  --  9.7 9.7 9.4  MG  --   --  3.0*  --   --   PHOS  --   --   --  7.4*  --    GFR: Estimated Creatinine Clearance: 6.5 mL/min (A) (by C-G formula based on SCr of 7.73 mg/dL (H)). Recent Labs  Lab 11/18/2019 1639 12/14/2019 1957 11/20/2019 2313 11/29/2019 2340 11/27/19 0429 12-09-2019 0043  WBC 13.2*  --  6.1  --  7.7 16.8*  LATICACIDVEN 3.4* 3.5*  --  3.5*  --   --     Liver Function Tests: Recent Labs  Lab 12/10/2019 1639 09-Dec-2019 0043  AST 28 82*  ALT 14 70*  ALKPHOS 106 89  BILITOT 0.7 0.8  PROT 6.8 6.3*  ALBUMIN 3.8 3.4*   No results for input(s): LIPASE, AMYLASE in the last 168 hours. Recent Labs  Lab 12/09/2019 0043  AMMONIA 110*    ABG    Component Value Date/Time   PHART 7.228 (L) 09/30/2018 0426   PCO2ART 38.8 09/30/2018 0426   PO2ART 153.0 (H) 09/30/2018 0426   HCO3 23.6 11/29/2019 1655   TCO2 24 11/18/2019 1655  TCO2 25 11/25/2019 1655   ACIDBASEDEF 2.0 12/11/2019 1655   O2SAT 98.0 11/20/2019 1655     Coagulation Profile: No results for input(s): INR, PROTIME in the last 168 hours.  Cardiac Enzymes: No results for input(s): CKTOTAL, CKMB, CKMBINDEX, TROPONINI in the last 168 hours.  HbA1C: Hgb A1c MFr Bld  Date/Time Value Ref Range Status  11/01/2019 01:04 AM 5.1 4.8 - 5.6 % Final    Comment:    (NOTE) Pre diabetes:          5.7%-6.4% Diabetes:              >6.4% Glycemic control for   <7.0% adults with diabetes   04/01/2019 04:48 AM 4.9 4.8 - 5.6 % Final    Comment:    (NOTE) Pre diabetes:          5.7%-6.4% Diabetes:              >6.4% Glycemic control for   <7.0% adults with diabetes     CBG: No results for input(s): GLUCAP in the last 168 hours.  Review of Systems:   Unable to obtain due to intubated status  Past Medical History  She,  has a past medical history of AAA (abdominal aortic  aneurysm) ([place]) (11/12/2019), Alcohol abuse, Arthralgia, CAD (coronary artery disease) (2006, 2016), Chronic combined systolic and diastolic HF (heart failure) ([place]), COPD (chronic obstructive pulmonary disease) ([place]), Depression, Dyspnea, ESRD (end stage renal disease) ([place]), HTN (hypertension), Hypercholesterolemia, Lung cancer ([place]), RAS (renal artery stenosis) ([place]) (2002), and Smoking.   Surgical History    Past Surgical History:  Procedure Laterality Date  . abdominal aortogram     perclose of the right femoral artery  . AV FISTULA PLACEMENT Right 07/11/2018   Procedure: Right arm ARTERIOVENOUS FISTULA CREATION;  Surgeon: [name], MD;  Location: [facility];  Service: Vascular;  Laterality: Right;  . AVF placement Right   . CARDIAC CATHETERIZATION     left heart catheterization.  Coronary cineangiography. Lft ventricular cineangiography.    [name] LEFT HEART CATH AND CORONARY ANGIOGRAPHY N/A 11/01/2019   Procedure: LEFT HEART CATH AND CORONARY ANGIOGRAPHY;  Surgeon: Burnell Blanks, MD;  Location: [facility];  Service: Cardiovascular;  Laterality: N/A;  . LUNG SURGERY     due to lung cancer per patient's report  . TUBAL LIGATION       Social History   reports that she quit smoking about 6 months ago. Her smoking use included cigarettes. She quit after 35.00 years of use. She has never used smokeless tobacco. She reports previous alcohol use. She reports that she does not use drugs.   Family History   Her family history includes Cancer in her brother; Emphysema in her father.   Allergies Allergies  Allergen Reactions  . Citalopram Hydrobromide Hives  . Morphine And Related Itching  . Tape Other (See Comments)    PLASTIC, CLEAR TAPE STICKS TO THE SKIN AND POSSIBLY TEARS IT- PLEASE USE PAPER TAPE     Home Medications  Prior to Admission medications   Medication Sig Start Date End Date Taking? Authorizing Provider  albuterol (VENTOLIN HFA) 108 (90 Base) MCG/ACT  inhaler Inhale 2 puffs into the lungs every 4 (four) hours as needed for wheezing or shortness of breath. 11/21/19  Yes Medina-Vargas, Monina C, NP  aspirin EC 81 MG tablet Take 81 mg by mouth daily.   Yes [provider]  atorvastatin (LIPITOR) 40 MG tablet Take 1 tablet (40 mg  total) by mouth daily. 11/21/19  Yes Medina-Vargas, Monina C, NP  AURYXIA 1 GM 210 MG(Fe) tablet Take 2-3 tablets (420-630 mg total) by mouth See admin instructions. Take 3 tablets (630 mg) by mouth M-W-F at bedtime. Give 3 tablets three times daily with meals Tue-Thu-Sat-Sun. Patient taking differently: Take 420-630 mg by mouth See admin instructions. Take 3 tablets (630 mg) by mouth M-W-F (dialysis days) at bedtime. Take 3 tablets (630 mg) three times daily with meals Tue-Thu-Sat-Sun (non-dialysis days) 11/21/19  Yes Medina-Vargas, Monina C, NP  b complex-vitamin c-folic acid (NEPHRO-VITE) 0.8 MG TABS tablet Take 1 tablet by mouth See admin instructions. Take 1 tablet by mouth in the morning on Sun/Tues/Thurs/Sat and 1 tablet after dialysis on Mon/Wed/Fri 02/06/19  Yes [provider]  budesonide-formoterol (SYMBICORT) 160-4.5 MCG/ACT inhaler Inhale 2 puffs into the lungs 2 (two) times daily. 11/21/19  Yes Medina-Vargas, Monina C, NP  buPROPion (WELLBUTRIN SR) 150 MG 12 hr tablet Take 1 tablet (150 mg total) by mouth 2 (two) times daily. 11/21/19  Yes Medina-Vargas, Monina C, NP  clopidogrel (PLAVIX) 75 MG tablet Take 1 tablet (75 mg total) by mouth daily. 11/21/19  Yes Medina-Vargas, Monina C, NP  lidocaine-prilocaine (EMLA) cream Apply 1 application topically every Monday, Wednesday, and Friday with hemodialysis. Prior to dialysis Patient taking differently: Apply 1 application topically See admin instructions. Apply topically to port access one hour prior to dialysis on Monday, Wednesday, Friday 11/22/19  Yes Medina-Vargas, Monina C, NP  metoprolol succinate (TOPROL-XL) 50 MG 24 hr tablet Take 1 tablet (50 mg total) by  mouth daily. Take with or immediately following a meal. Patient taking differently: Take 50 mg by mouth at bedtime. Take with or immediately following a meal.  11/21/19  Yes Medina-Vargas, Monina C, NP  nitroGLYCERIN (NITROSTAT) 0.4 MG SL tablet Place 1 tablet (0.4 mg total) under the tongue every 5 (five) minutes as needed for chest pain. 11/21/19  Yes Medina-Vargas, Monina C, NP  Nutritional Supplements (FEEDING SUPPLEMENT, NEPRO CARB STEADY,) LIQD Take 237 mLs by mouth 2 (two) times daily between meals.   Yes [provider]  OXYGEN Inhale 3 L/min into the lungs continuous.    Yes [provider]  QUEtiapine (SEROQUEL) 200 MG tablet Take 1 tablet (200 mg total) by mouth at bedtime. 11/21/19  Yes Medina-Vargas, Monina C, NP     Critical care time: 70 min   The patient is critically ill with multiple organ systems failure and requires high complexity decision making for assessment and support, frequent evaluation and titration of therapies, application of advanced monitoring technologies and extensive interpretation of multiple databases.   Rodman Pickle, M.D. [facility] Pulmonary/Critical Care Medicine 13-Dec-2019 2:13 AM   Please see Amion for pager number to reach on-call Pulmonary and Critical Care Team.

## 2019-12-18 NOTE — Progress Notes (Signed)
Tele notified of pts heart rate dropping while checking on pt I  found pt in asystole I then called a code and started chest compressions until treatment team arrived.  I contacted the family  left a message for the primary Care Dr as well as left a page for the triad floor coverage tonight.  After pt was stable we then transfered to 2h where I gave report to her new care team.  Family asks for updates  Belongings sent to 2h

## 2019-12-18 NOTE — Progress Notes (Signed)
Patient ID: Kari Robinson, female   DOB: 25-Nov-1950, 69 y.o.   MRN: [id number] Late entry.  I was called this morning at 0306 by K.Eubanks NP with CCM and updated of the patient's cardiac arrest following dialysis last night. Labs significant for hyperkalemia with hemodynamic instability. CRRT ordered and will update AM team.  Elmarie Shiley MD [facility]. Office # [phone] Pager # [phone] 6:47 AM

## 2019-12-18 NOTE — Progress Notes (Signed)
Code blue Patient had been in [place] 25 but moved to 2 H-12.  Family was called but were not coming to hospital.  Told nurse I am here if needed to call me back. Conard Novak, Chaplain   December 02, 2019 0100  Clinical Encounter Type  Visited With Patient not available;Other (Comment) (Code blue)  Visit Type Code  Referral From Care management  Consult/Referral To Chaplain  Spiritual Encounters  Spiritual Needs Prayer

## 2019-12-18 NOTE — Progress Notes (Signed)
[facility] Progress Note Patient Name: Kari Robinson DOB: [birth year] MRN: [id number]   Date of Service  11-30-2019  HPI/Events of Note  Code blue called, when I entered the room CPR was in progress. Pt received 18 minutes of ACLS protcol without ROSC, she also received 3 amps of sodium bicarbonate for hyperkalemia and acidosis, and one amp of calcium chloride. Code was called at 6:43 a.m. and her son son notified immediately afterward.  eICU Interventions  Code management.        Kerry Kass Evy Lutterman 11/30/2019, 6:45 AM

## 2019-12-18 NOTE — Progress Notes (Signed)
CDS referral made. Referral number is [id number].

## 2019-12-18 NOTE — Anesthesia Procedure Notes (Signed)
Procedure Name: Intubation Date/Time: 12-25-19 12:40 AM Performed by: Jearld Pies, CRNA Pre-anesthesia Checklist: Patient identified, Emergency Drugs available, Suction available and Patient being monitored Patient Re-evaluated:Patient Re-evaluated prior to induction Oxygen Delivery Method: Ambu bag Preoxygenation: Pre-oxygenation with 100% oxygen Laryngoscope Size: Mac and 3 Grade View: Grade I Tube type: Oral Tube size: 7.5 mm Number of attempts: 1 Airway Equipment and Method: Stylet Placement Confirmation: ETT inserted through vocal cords under direct vision,  breath sounds checked- equal and bilateral and CO2 detector Secured at: 23 cm Tube secured with: Tape Dental Injury: Teeth and Oropharynx as per pre-operative assessment  Comments: This CRNA responded to Code Blue, RT DL x 1 with esophageal intubation, ETT removed, this CRNA DL x 1, G1 view, ETT passed without difficulty, secured at 23 cm via right lip with tape by RT at bedside.

## 2019-12-18 NOTE — Code Documentation (Addendum)
CODE BLUE NOTE  Patient Name: Kari Robinson   MRN: [id number]   Date of Birth/ Sex: September 15, 1951 , female      Admission Date: 11/21/2019  Attending Provider: Mariel Aloe, MD  Primary Diagnosis: <principal problem not specified>    Indication: Pt was in her usual state of health until this AM, when she was noted to be unresponsive. Code blue was subsequently called. At the time of arrival on scene, ACLS protocol was underway.    Technical Description:  - CPR performance duration:  1 hour  - Was defibrillation or cardioversion used? Yes   - Was external pacer placed? No  - Was patient intubated pre/post CPR? Yes    Medications Administered: Y = Yes; Blank = No Amiodarone  y  Atropine    Calcium  y  Epinephrine  y  Lidocaine    Magnesium    Norepinephrine  y  Phenylephrine    Sodium bicarbonate  y  Vasopressin      Post CPR evaluation:  - Final Status - Was patient successfully resuscitated ? Yes - What is current rhythm? Sinus with wide qrs complexes  - What is current hemodynamic status? Stable but tenuous   Miscellaneous Information:  - Labs sent, including: CBC, CMP, troponin, ammonia  - Primary team notified?  Yes  - Family Notified? Yes  - Additional notes/ transfer status:  transferred to 2heart        Lurline Del, DO  Dec 27, 2019, 2:09 AM

## 2019-12-18 NOTE — Procedures (Signed)
Hemodialysis Catheter Insertion Procedure Note Kari Robinson [id number] May 04, 1951  Procedure: Insertion of Hemodialysis Catheter Indications: Dialysis Access   Procedure Details Consent: Unable to obtain consent because of emergent medical necessity. Time Out: Verified patient identification, verified procedure, site/side was marked, verified correct patient position, special equipment/implants available, medications/allergies/relevent history reviewed, required imaging and test results available.  Performed  Maximum sterile technique was used including antiseptics, cap, gloves, gown, hand hygiene, mask and sheet. Skin prep: Chlorhexidine; local anesthetic administered Trialysis hemodialysis catheter was inserted into right internal jugular vein using the Seldinger technique.  Evaluation Blood flow good Complications: No apparent complications Patient did tolerate procedure well. Chest X-ray ordered to verify placement.  CXR: pending.  Hayden Pedro, AGACNP-BC [facility]  PCCM Pgr: [phone]

## 2019-12-18 NOTE — Progress Notes (Deleted)
[facility] Progress Note Patient Name: Bree Heinzelman DOB: [birth year] MRN: [id number]   Date of Service  12-18-2019  HPI/Events of Note  Monitor EKG has a wavy baseline : artifact due to shivering vs flutter, Pt is visibly shivering.  eICU Interventions  Stat 12 lead EKG, warm blanket, Demerol 12.5 mg iv x 1        Markeya Mincy U Larson Limones 2019-12-18, 2:14 AM

## 2019-12-18 DEATH — deceased
